# Patient Record
Sex: Male | Born: 1965 | Race: Black or African American | Hispanic: No | Marital: Single | Smoking: Current every day smoker
Health system: Southern US, Community
[De-identification: ages and names within clinical notes are randomized; demographics above are authoritative.]

## PROBLEM LIST (undated history)

## (undated) DIAGNOSIS — E119 Type 2 diabetes mellitus without complications: Secondary | ICD-10-CM

## (undated) DIAGNOSIS — I2510 Atherosclerotic heart disease of native coronary artery without angina pectoris: Secondary | ICD-10-CM

## (undated) DIAGNOSIS — I255 Ischemic cardiomyopathy: Secondary | ICD-10-CM

## (undated) DIAGNOSIS — D649 Anemia, unspecified: Secondary | ICD-10-CM

## (undated) DIAGNOSIS — I119 Hypertensive heart disease without heart failure: Secondary | ICD-10-CM

## (undated) DIAGNOSIS — G629 Polyneuropathy, unspecified: Secondary | ICD-10-CM

## (undated) DIAGNOSIS — I10 Essential (primary) hypertension: Secondary | ICD-10-CM

## (undated) DIAGNOSIS — I5042 Chronic combined systolic (congestive) and diastolic (congestive) heart failure: Secondary | ICD-10-CM

## (undated) DIAGNOSIS — R569 Unspecified convulsions: Secondary | ICD-10-CM

## (undated) DIAGNOSIS — Z227 Latent tuberculosis: Secondary | ICD-10-CM

## (undated) DIAGNOSIS — I6529 Occlusion and stenosis of unspecified carotid artery: Secondary | ICD-10-CM

## (undated) DIAGNOSIS — F32A Depression, unspecified: Secondary | ICD-10-CM

## (undated) DIAGNOSIS — K219 Gastro-esophageal reflux disease without esophagitis: Secondary | ICD-10-CM

## (undated) DIAGNOSIS — F4310 Post-traumatic stress disorder, unspecified: Secondary | ICD-10-CM

## (undated) DIAGNOSIS — Z720 Tobacco use: Secondary | ICD-10-CM

## (undated) DIAGNOSIS — A159 Respiratory tuberculosis unspecified: Secondary | ICD-10-CM

## (undated) DIAGNOSIS — J189 Pneumonia, unspecified organism: Secondary | ICD-10-CM

## (undated) DIAGNOSIS — R011 Cardiac murmur, unspecified: Secondary | ICD-10-CM

## (undated) DIAGNOSIS — N186 End stage renal disease: Secondary | ICD-10-CM

## (undated) DIAGNOSIS — I509 Heart failure, unspecified: Secondary | ICD-10-CM

## (undated) DIAGNOSIS — N189 Chronic kidney disease, unspecified: Secondary | ICD-10-CM

## (undated) HISTORY — PX: OTHER SURGICAL HISTORY: SHX169

## (undated) HISTORY — DX: Depression, unspecified: F32.A

## (undated) HISTORY — DX: Occlusion and stenosis of unspecified carotid artery: I65.29

## (undated) HISTORY — PX: TOE AMPUTATION: SHX809

---

## 2011-10-14 ENCOUNTER — Encounter (HOSPITAL_BASED_OUTPATIENT_CLINIC_OR_DEPARTMENT_OTHER): Payer: Self-pay | Attending: General Surgery

## 2011-10-14 DIAGNOSIS — Z794 Long term (current) use of insulin: Secondary | ICD-10-CM | POA: Insufficient documentation

## 2011-10-14 DIAGNOSIS — L97409 Non-pressure chronic ulcer of unspecified heel and midfoot with unspecified severity: Secondary | ICD-10-CM | POA: Insufficient documentation

## 2011-10-14 DIAGNOSIS — L97509 Non-pressure chronic ulcer of other part of unspecified foot with unspecified severity: Secondary | ICD-10-CM | POA: Insufficient documentation

## 2011-10-14 DIAGNOSIS — E1169 Type 2 diabetes mellitus with other specified complication: Secondary | ICD-10-CM | POA: Insufficient documentation

## 2011-10-14 DIAGNOSIS — L84 Corns and callosities: Secondary | ICD-10-CM | POA: Insufficient documentation

## 2011-10-14 LAB — GLUCOSE, CAPILLARY: Glucose-Capillary: 143 mg/dL — ABNORMAL HIGH (ref 70–99)

## 2011-10-14 NOTE — Progress Notes (Signed)
[facility]  NAME:  Cody Webster, CRISANTO NO.:  [id number]  MEDICAL RECORD NO.:  [id number]      DATE OF BIRTH:  July 24, 1965  PHYSICIAN:  Judene Companion, M.D.           VISIT DATE:                                  OFFICE VISIT   A 46 year old, African American male, who has been a diabetic for 5 or 6 years.  He is insulin-dependent and poorly controlled.  He has had for the last few months bilateral diabetic foot ulcers.  They are almost mirror images and they are on his heels, the lateral aspect of both of his heels and the lateral aspect in the area of the 5th MP joints of both feet.  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  He is not obese, he is well muscled.  He works hard, and of course, having these diabetic foot ulcers makes it difficult.  We talked about total contact cast while he was here and we are going to start out on the left foot putting a total contact cast, and do multiple debridements.  I debrided all the callus on all 4 of his wounds.  All of them are about a cm or so in diameter or little larger and they look reasonably clean, and at least at this point, I do not think he has any evidence of osteomyelitis.  He will come back in a week and I also thought that he would be a candidate for Dermagraft, so we will keep evaluating him for Dermagraft, and at this time, we are treating him with collagen, and he will come back in a week.     Judene Companion, M.D.     PP/MEDQ  D:  10/14/2011  T:  10/14/2011  Job:  [id number]

## 2011-10-17 ENCOUNTER — Encounter (HOSPITAL_BASED_OUTPATIENT_CLINIC_OR_DEPARTMENT_OTHER): Payer: Self-pay | Attending: General Surgery

## 2011-10-17 DIAGNOSIS — I10 Essential (primary) hypertension: Secondary | ICD-10-CM | POA: Insufficient documentation

## 2011-10-17 DIAGNOSIS — E1169 Type 2 diabetes mellitus with other specified complication: Secondary | ICD-10-CM | POA: Insufficient documentation

## 2011-10-17 DIAGNOSIS — L97509 Non-pressure chronic ulcer of other part of unspecified foot with unspecified severity: Secondary | ICD-10-CM | POA: Insufficient documentation

## 2011-10-17 DIAGNOSIS — L97409 Non-pressure chronic ulcer of unspecified heel and midfoot with unspecified severity: Secondary | ICD-10-CM | POA: Insufficient documentation

## 2011-10-17 DIAGNOSIS — Z79899 Other long term (current) drug therapy: Secondary | ICD-10-CM | POA: Insufficient documentation

## 2011-10-21 LAB — GLUCOSE, CAPILLARY: Glucose-Capillary: 167 mg/dL — ABNORMAL HIGH (ref 70–99)

## 2011-11-04 ENCOUNTER — Ambulatory Visit (HOSPITAL_COMMUNITY)
Admission: RE | Admit: 2011-11-04 | Discharge: 2011-11-04 | Disposition: A | Discharge status: Home / Self Care | Payer: Self-pay | Source: Ambulatory Visit | Attending: General Surgery | Admitting: General Surgery

## 2011-11-04 ENCOUNTER — Other Ambulatory Visit (HOSPITAL_BASED_OUTPATIENT_CLINIC_OR_DEPARTMENT_OTHER): Payer: Self-pay | Admitting: General Surgery

## 2011-11-04 DIAGNOSIS — E119 Type 2 diabetes mellitus without complications: Secondary | ICD-10-CM | POA: Insufficient documentation

## 2011-11-04 DIAGNOSIS — R52 Pain, unspecified: Secondary | ICD-10-CM

## 2011-11-04 LAB — GLUCOSE, CAPILLARY: Glucose-Capillary: 129 mg/dL — ABNORMAL HIGH (ref 70–99)

## 2011-11-11 ENCOUNTER — Encounter (HOSPITAL_BASED_OUTPATIENT_CLINIC_OR_DEPARTMENT_OTHER): Payer: Self-pay

## 2011-11-15 ENCOUNTER — Encounter (HOSPITAL_BASED_OUTPATIENT_CLINIC_OR_DEPARTMENT_OTHER): Payer: Self-pay | Attending: General Surgery

## 2011-11-15 DIAGNOSIS — L97409 Non-pressure chronic ulcer of unspecified heel and midfoot with unspecified severity: Secondary | ICD-10-CM | POA: Insufficient documentation

## 2011-11-15 DIAGNOSIS — L97509 Non-pressure chronic ulcer of other part of unspecified foot with unspecified severity: Secondary | ICD-10-CM | POA: Insufficient documentation

## 2011-11-15 DIAGNOSIS — E1169 Type 2 diabetes mellitus with other specified complication: Secondary | ICD-10-CM | POA: Insufficient documentation

## 2011-11-18 LAB — GLUCOSE, CAPILLARY: Glucose-Capillary: 158 mg/dL — ABNORMAL HIGH (ref 70–99)

## 2011-12-15 ENCOUNTER — Encounter (HOSPITAL_BASED_OUTPATIENT_CLINIC_OR_DEPARTMENT_OTHER): Payer: Self-pay | Attending: General Surgery

## 2011-12-15 DIAGNOSIS — L97509 Non-pressure chronic ulcer of other part of unspecified foot with unspecified severity: Secondary | ICD-10-CM | POA: Insufficient documentation

## 2011-12-15 DIAGNOSIS — E1169 Type 2 diabetes mellitus with other specified complication: Secondary | ICD-10-CM | POA: Insufficient documentation

## 2011-12-15 DIAGNOSIS — L97409 Non-pressure chronic ulcer of unspecified heel and midfoot with unspecified severity: Secondary | ICD-10-CM | POA: Insufficient documentation

## 2011-12-20 ENCOUNTER — Encounter (HOSPITAL_BASED_OUTPATIENT_CLINIC_OR_DEPARTMENT_OTHER): Payer: Self-pay

## 2012-10-20 DIAGNOSIS — E785 Hyperlipidemia, unspecified: Secondary | ICD-10-CM | POA: Insufficient documentation

## 2013-05-16 DIAGNOSIS — I130 Hypertensive heart and chronic kidney disease with heart failure and stage 1 through stage 4 chronic kidney disease, or unspecified chronic kidney disease: Secondary | ICD-10-CM

## 2013-05-16 DIAGNOSIS — N181 Chronic kidney disease, stage 1: Secondary | ICD-10-CM

## 2013-05-16 HISTORY — DX: Chronic kidney disease, stage 1: N18.1

## 2013-05-16 HISTORY — DX: Hypertensive heart and chronic kidney disease with heart failure and stage 1 through stage 4 chronic kidney disease, or unspecified chronic kidney disease: I13.0

## 2013-08-30 DIAGNOSIS — M86171 Other acute osteomyelitis, right ankle and foot: Secondary | ICD-10-CM | POA: Insufficient documentation

## 2013-09-03 DIAGNOSIS — R0602 Shortness of breath: Secondary | ICD-10-CM | POA: Insufficient documentation

## 2013-10-08 DIAGNOSIS — K0889 Other specified disorders of teeth and supporting structures: Secondary | ICD-10-CM | POA: Insufficient documentation

## 2013-12-10 ENCOUNTER — Inpatient Hospital Stay (HOSPITAL_COMMUNITY)
Admission: EM | Admit: 2013-12-10 | Discharge: 2013-12-13 | DRG: 639 | Disposition: A | Discharge status: Home / Self Care | Payer: Medicaid Other | Attending: Internal Medicine | Admitting: Internal Medicine

## 2013-12-10 ENCOUNTER — Emergency Department (HOSPITAL_COMMUNITY): Payer: Medicaid Other

## 2013-12-10 ENCOUNTER — Encounter (HOSPITAL_COMMUNITY): Payer: Self-pay | Admitting: Emergency Medicine

## 2013-12-10 DIAGNOSIS — I10 Essential (primary) hypertension: Secondary | ICD-10-CM | POA: Diagnosis present

## 2013-12-10 DIAGNOSIS — F17200 Nicotine dependence, unspecified, uncomplicated: Secondary | ICD-10-CM | POA: Diagnosis present

## 2013-12-10 DIAGNOSIS — E1169 Type 2 diabetes mellitus with other specified complication: Secondary | ICD-10-CM | POA: Diagnosis not present

## 2013-12-10 DIAGNOSIS — L97509 Non-pressure chronic ulcer of other part of unspecified foot with unspecified severity: Secondary | ICD-10-CM | POA: Diagnosis present

## 2013-12-10 DIAGNOSIS — Z833 Family history of diabetes mellitus: Secondary | ICD-10-CM

## 2013-12-10 DIAGNOSIS — N189 Chronic kidney disease, unspecified: Secondary | ICD-10-CM | POA: Diagnosis present

## 2013-12-10 DIAGNOSIS — M79609 Pain in unspecified limb: Secondary | ICD-10-CM | POA: Diagnosis present

## 2013-12-10 DIAGNOSIS — E1149 Type 2 diabetes mellitus with other diabetic neurological complication: Secondary | ICD-10-CM | POA: Diagnosis present

## 2013-12-10 DIAGNOSIS — I509 Heart failure, unspecified: Secondary | ICD-10-CM | POA: Diagnosis present

## 2013-12-10 DIAGNOSIS — S91302A Unspecified open wound, left foot, initial encounter: Secondary | ICD-10-CM

## 2013-12-10 DIAGNOSIS — E11621 Type 2 diabetes mellitus with foot ulcer: Secondary | ICD-10-CM | POA: Diagnosis present

## 2013-12-10 DIAGNOSIS — S91309A Unspecified open wound, unspecified foot, initial encounter: Secondary | ICD-10-CM

## 2013-12-10 DIAGNOSIS — I129 Hypertensive chronic kidney disease with stage 1 through stage 4 chronic kidney disease, or unspecified chronic kidney disease: Secondary | ICD-10-CM | POA: Diagnosis present

## 2013-12-10 DIAGNOSIS — Z66 Do not resuscitate: Secondary | ICD-10-CM | POA: Diagnosis present

## 2013-12-10 DIAGNOSIS — L97529 Non-pressure chronic ulcer of other part of left foot with unspecified severity: Secondary | ICD-10-CM

## 2013-12-10 DIAGNOSIS — E1142 Type 2 diabetes mellitus with diabetic polyneuropathy: Secondary | ICD-10-CM | POA: Diagnosis present

## 2013-12-10 DIAGNOSIS — G629 Polyneuropathy, unspecified: Secondary | ICD-10-CM | POA: Diagnosis present

## 2013-12-10 HISTORY — DX: Essential (primary) hypertension: I10

## 2013-12-10 HISTORY — DX: Heart failure, unspecified: I50.9

## 2013-12-10 HISTORY — DX: Type 2 diabetes mellitus without complications: E11.9

## 2013-12-10 LAB — CBC WITH DIFFERENTIAL/PLATELET
BASOS ABS: 0 10*3/uL (ref 0.0–0.1)
BASOS PCT: 0 % (ref 0–1)
Eosinophils Absolute: 0.1 10*3/uL (ref 0.0–0.7)
Eosinophils Relative: 1 % (ref 0–5)
HEMATOCRIT: 38.2 % — AB (ref 39.0–52.0)
Hemoglobin: 13 g/dL (ref 13.0–17.0)
LYMPHS PCT: 31 % (ref 12–46)
Lymphs Abs: 1.9 10*3/uL (ref 0.7–4.0)
MCH: 31.5 pg (ref 26.0–34.0)
MCHC: 34 g/dL (ref 30.0–36.0)
MCV: 92.5 fL (ref 78.0–100.0)
MONO ABS: 0.5 10*3/uL (ref 0.1–1.0)
Monocytes Relative: 9 % (ref 3–12)
NEUTROS ABS: 3.6 10*3/uL (ref 1.7–7.7)
NEUTROS PCT: 59 % (ref 43–77)
PLATELETS: 187 10*3/uL (ref 150–400)
RBC: 4.13 MIL/uL — ABNORMAL LOW (ref 4.22–5.81)
RDW: 14.4 % (ref 11.5–15.5)
WBC: 6.1 10*3/uL (ref 4.0–10.5)

## 2013-12-10 LAB — BASIC METABOLIC PANEL
ANION GAP: 11 (ref 5–15)
BUN: 28 mg/dL — ABNORMAL HIGH (ref 6–23)
CHLORIDE: 103 meq/L (ref 96–112)
CO2: 23 meq/L (ref 19–32)
Calcium: 8.6 mg/dL (ref 8.4–10.5)
Creatinine, Ser: 2.33 mg/dL — ABNORMAL HIGH (ref 0.50–1.35)
GFR calc Af Amer: 36 mL/min — ABNORMAL LOW (ref >90)
GFR calc non Af Amer: 31 mL/min — ABNORMAL LOW (ref >90)
Glucose, Bld: 123 mg/dL — ABNORMAL HIGH (ref 70–99)
Potassium: 4.2 mEq/L (ref 3.7–5.3)
SODIUM: 137 meq/L (ref 137–147)

## 2013-12-10 LAB — SEDIMENTATION RATE: SED RATE: 25 mm/h — AB (ref 0–16)

## 2013-12-10 LAB — GLUCOSE, CAPILLARY: GLUCOSE-CAPILLARY: 226 mg/dL — AB (ref 70–99)

## 2013-12-10 LAB — C-REACTIVE PROTEIN: CRP: <0.5 mg/dL — ABNORMAL LOW (ref <0.60)

## 2013-12-10 MED ORDER — INSULIN ASPART 100 UNIT/ML ~~LOC~~ SOLN
0.0000 [IU] | Freq: Three times a day (TID) | SUBCUTANEOUS | Status: DC
  Administered 2013-12-11 – 2013-12-12 (×3): 3 [IU] via SUBCUTANEOUS
  Administered 2013-12-13: 2 [IU] via SUBCUTANEOUS

## 2013-12-10 MED ORDER — PIPERACILLIN-TAZOBACTAM 3.375 G IVPB
3.3750 g | Freq: Three times a day (TID) | INTRAVENOUS | Status: DC
  Administered 2013-12-11 – 2013-12-13 (×7): 3.375 g via INTRAVENOUS
  Filled 2013-12-10 (×8): qty 50

## 2013-12-10 MED ORDER — PIPERACILLIN-TAZOBACTAM 3.375 G IVPB
3.3750 g | Freq: Once | INTRAVENOUS | Status: AC
  Administered 2013-12-10: 3.375 g via INTRAVENOUS
  Filled 2013-12-10: qty 50

## 2013-12-10 MED ORDER — CARVEDILOL 12.5 MG PO TABS
12.5000 mg | ORAL_TABLET | Freq: Two times a day (BID) | ORAL | Status: DC
  Administered 2013-12-11 (×3): 12.5 mg via ORAL
  Filled 2013-12-10 (×4): qty 1

## 2013-12-10 MED ORDER — ACETAMINOPHEN 650 MG RE SUPP: 650.0000 mg | Freq: Four times a day (QID) | RECTAL | Status: DC | PRN

## 2013-12-10 MED ORDER — ALUM & MAG HYDROXIDE-SIMETH 200-200-20 MG/5ML PO SUSP
30.0000 mL | Freq: Four times a day (QID) | ORAL | Status: DC | PRN
  Administered 2013-12-11 – 2013-12-13 (×2): 30 mL via ORAL
  Filled 2013-12-10 (×2): qty 30

## 2013-12-10 MED ORDER — HYDROCODONE-ACETAMINOPHEN 5-325 MG PO TABS
1.0000 | ORAL_TABLET | ORAL | Status: DC | PRN
  Administered 2013-12-10 – 2013-12-12 (×6): 2 via ORAL
  Filled 2013-12-10 (×8): qty 2

## 2013-12-10 MED ORDER — INSULIN ASPART 100 UNIT/ML ~~LOC~~ SOLN
0.0000 [IU] | Freq: Every day | SUBCUTANEOUS | Status: DC
  Administered 2013-12-10: 2 [IU] via SUBCUTANEOUS

## 2013-12-10 MED ORDER — HYDROMORPHONE HCL PF 1 MG/ML IJ SOLN
1.0000 mg | Freq: Once | INTRAMUSCULAR | Status: AC
  Administered 2013-12-10: 1 mg via INTRAVENOUS
  Filled 2013-12-10: qty 1

## 2013-12-10 MED ORDER — HEPARIN SODIUM (PORCINE) 5000 UNIT/ML IJ SOLN
5000.0000 [IU] | Freq: Three times a day (TID) | INTRAMUSCULAR | Status: DC
  Administered 2013-12-10 – 2013-12-13 (×8): 5000 [IU] via SUBCUTANEOUS
  Filled 2013-12-10 (×11): qty 1

## 2013-12-10 MED ORDER — DOCUSATE SODIUM 100 MG PO CAPS
100.0000 mg | ORAL_CAPSULE | Freq: Two times a day (BID) | ORAL | Status: DC
  Administered 2013-12-10 – 2013-12-13 (×6): 100 mg via ORAL

## 2013-12-10 MED ORDER — GABAPENTIN 100 MG PO CAPS
100.0000 mg | ORAL_CAPSULE | Freq: Three times a day (TID) | ORAL | Status: DC
  Administered 2013-12-10 – 2013-12-13 (×8): 100 mg via ORAL
  Filled 2013-12-10 (×11): qty 1

## 2013-12-10 MED ORDER — VANCOMYCIN HCL 10 G IV SOLR
1500.0000 mg | INTRAVENOUS | Status: DC
  Administered 2013-12-11 – 2013-12-12 (×2): 1500 mg via INTRAVENOUS
  Filled 2013-12-10 (×2): qty 1500

## 2013-12-10 MED ORDER — FENTANYL CITRATE 0.05 MG/ML IJ SOLN
50.0000 ug | Freq: Once | INTRAMUSCULAR | Status: AC
  Administered 2013-12-10: 50 ug via INTRAVENOUS
  Filled 2013-12-10: qty 2

## 2013-12-10 MED ORDER — HYDRALAZINE HCL 20 MG/ML IJ SOLN
5.0000 mg | Freq: Four times a day (QID) | INTRAMUSCULAR | Status: DC | PRN
  Administered 2013-12-11 – 2013-12-12 (×2): 5 mg via INTRAVENOUS
  Filled 2013-12-10 (×3): qty 1

## 2013-12-10 MED ORDER — ACETAMINOPHEN 325 MG PO TABS: 650.0000 mg | ORAL_TABLET | Freq: Four times a day (QID) | ORAL | Status: DC | PRN

## 2013-12-10 MED ORDER — IPRATROPIUM-ALBUTEROL 0.5-2.5 (3) MG/3ML IN SOLN: 3.0000 mL | RESPIRATORY_TRACT | Status: DC | PRN

## 2013-12-10 MED ORDER — PROMETHAZINE HCL 25 MG PO TABS
12.5000 mg | ORAL_TABLET | Freq: Four times a day (QID) | ORAL | Status: DC | PRN
  Administered 2013-12-11: 12.5 mg via ORAL
  Filled 2013-12-10: qty 1

## 2013-12-10 MED ORDER — FENTANYL CITRATE 0.05 MG/ML IJ SOLN
100.0000 ug | Freq: Once | INTRAMUSCULAR | Status: DC
  Filled 2013-12-10: qty 2

## 2013-12-10 MED ORDER — HYDROMORPHONE HCL PF 1 MG/ML IJ SOLN
1.0000 mg | INTRAMUSCULAR | Status: DC | PRN
  Administered 2013-12-11 – 2013-12-13 (×4): 1 mg via INTRAVENOUS
  Filled 2013-12-10 (×4): qty 1

## 2013-12-10 MED ORDER — DIPHENHYDRAMINE HCL 25 MG PO CAPS
25.0000 mg | ORAL_CAPSULE | Freq: Four times a day (QID) | ORAL | Status: DC | PRN
Start: 2013-12-10 — End: 2013-12-13
  Administered 2013-12-11 – 2013-12-12 (×4): 25 mg via ORAL
  Filled 2013-12-10 (×4): qty 1

## 2013-12-10 MED ORDER — SODIUM CHLORIDE 0.9 % IV SOLN
1500.0000 mg | Freq: Once | INTRAVENOUS | Status: AC
  Administered 2013-12-10: 1500 mg via INTRAVENOUS
  Filled 2013-12-10: qty 1500

## 2013-12-10 MED ORDER — NICOTINE 14 MG/24HR TD PT24
14.0000 mg | MEDICATED_PATCH | Freq: Every day | TRANSDERMAL | Status: DC
  Administered 2013-12-10 – 2013-12-13 (×4): 14 mg via TRANSDERMAL
  Filled 2013-12-10 (×4): qty 1

## 2013-12-10 MED ORDER — HYDRALAZINE HCL 20 MG/ML IJ SOLN
5.0000 mg | Freq: Once | INTRAMUSCULAR | Status: AC
  Administered 2013-12-10: 5 mg via INTRAVENOUS
  Filled 2013-12-10: qty 1

## 2013-12-10 NOTE — ED Notes (Signed)
Admitting dr at bedside, I will draw 2nd set of blood cultures once dr is done talking to pt.

## 2013-12-10 NOTE — ED Provider Notes (Signed)
CSN: [id number]     Arrival date & time 12/10/13  1343 History   First MD Initiated Contact with Patient 12/10/13 1459     Chief Complaint  Patient presents with  . Foot Ulcer     (Consider location/radiation/quality/duration/timing/severity/associated sxs/prior Treatment) HPI 48 year old male presents with left foot ulcer that has been draining for past 2 days. No fevers. Has had increased pain at foot. Has had ulcer for past 3-4 months. Several months ago had similar symptoms in right foot and had pinky toe amputated. States this is exactly how that one started. Has been on oral doxycycline since finishing IV abx through PICC. Orthopedic surgeon is in [place]. Rates pain as 9/10. Feels like both legs are swelling as well.   Past Medical History  Diagnosis Date  . Hypertension   . Diabetes mellitus without complication   . CHF (congestive heart failure)    Past Surgical History  Procedure Laterality Date  . Toe amputation     No family history on file. History  Substance Use Topics  . Smoking status: Current Every Day Smoker  . Smokeless tobacco: Not on file  . Alcohol Use: Yes    Review of Systems  Constitutional: Negative for fever.  Cardiovascular: Positive for leg swelling.  Gastrointestinal: Negative for vomiting.  Musculoskeletal: Positive for arthralgias.  Skin: Positive for wound.  All other systems reviewed and are negative.     Allergies  Morphine and related  Home Medications   Prior to Admission medications   Medication Sig Start Date End Date Taking? Authorizing Provider  carvedilol (COREG) 12.5 MG tablet Take 12.5 mg by mouth 2 (two) times daily with a meal.   Yes Historical Provider, MD  doxycycline (VIBRA-TABS) 100 MG tablet Take 100 mg by mouth 2 (two) times daily.   Yes Historical Provider, MD  gabapentin (NEURONTIN) 100 MG capsule Take 100 mg by mouth 3 (three) times daily.   Yes Historical Provider, MD   BP 196/99  Pulse 83  Temp(Src) 99.1  F (37.3 C) (Oral)  Resp 16  SpO2 100% Physical Exam  Nursing note and vitals reviewed. Constitutional: He is oriented to person, place, and time. He appears well-developed and well-nourished.  HENT:  Head: Normocephalic and atraumatic.  Right Ear: External ear normal.  Left Ear: External ear normal.  Nose: Nose normal.  Eyes: Right eye exhibits no discharge. Left eye exhibits no discharge.  Neck: Neck supple.  Cardiovascular: Normal rate, regular rhythm, normal heart sounds and intact distal pulses.   Pulses:      Dorsalis pedis pulses are 2+ on the right side, and 2+ on the left side.  Pulmonary/Chest: Effort normal.  Abdominal: Soft. There is no tenderness.  Musculoskeletal:  1 cm deep ulcer to left plantar foot. No drainage. Tenderness around foot. Right 5th toe surgically absent. No evidence of infection in that foot.  Neurological: He is alert and oriented to person, place, and time.  Skin: Skin is warm and dry. No erythema.    ED Course  Procedures (including critical care time) Labs Review Labs Reviewed  CBC WITH DIFFERENTIAL - Abnormal; Notable for the following:    RBC 4.13 (*)    HCT 38.2 (*)    All other components within normal limits  BASIC METABOLIC PANEL - Abnormal; Notable for the following:    Glucose, Bld 123 (*)    BUN 28 (*)    Creatinine, Ser 2.33 (*)    GFR calc non Af Amer 31 (*)  GFR calc Af Amer 36 (*)    All other components within normal limits  SEDIMENTATION RATE - Abnormal; Notable for the following:    Sed Rate 25 (*)    All other components within normal limits  C-REACTIVE PROTEIN - Abnormal; Notable for the following:    CRP <0.5 (*)    All other components within normal limits  GLUCOSE, CAPILLARY - Abnormal; Notable for the following:    Glucose-Capillary 226 (*)    All other components within normal limits  GLUCOSE, CAPILLARY - Abnormal; Notable for the following:    Glucose-Capillary 129 (*)    All other components within normal  limits  CULTURE, BLOOD (ROUTINE X 2)  CULTURE, BLOOD (ROUTINE X 2)  BASIC METABOLIC PANEL  MAGNESIUM  PHOSPHORUS  CBC  HEMOGLOBIN A1C    Imaging Review Dg Foot Complete Left  12/10/2013   CLINICAL DATA:  Nonhealing ulcer, diabetes  EXAM: LEFT FOOT - COMPLETE 3+ VIEW  COMPARISON:  11/04/2011  FINDINGS: Normal alignment without acute fracture. Lucency within the soft tissues of the left foot along the fifth toe at the MTP joint and proximal phalanx on the oblique view, suspect ulceration. No underlying bone loss or periostitis. Relatively preserved joint spaces. No significant arthropathy. No radiopaque foreign body.  IMPRESSION: No definite acute osseous finding by plain radiography.   Electronically Signed   By: Daryll Brod M.D.   On: 12/10/2013 16:05     EKG Interpretation None      MDM   Final diagnoses:  Open wound of plantar aspect of foot, left, initial encounter    Given patient's diabetes and hx of osteo with recurrent symptoms but on different foot, I feel he should be admitted for osteo workup. Discussed with hospitalist, they request blood cultures and broad abx, and admit.    Ephraim Hamburger, MD 12/11/13 [phone]

## 2013-12-10 NOTE — H&P (Signed)
PCP: None   Chief Complaint:  Pain left foot  HPI: This is a 48y/o gentle man with a h/o diabetes and diabetic foot ulcer. He recently had an amputation of right small toe approximately 70months ago. He has a diabetetic foot ulcer on the sole of the left foot approximately 69months now. He was being seen at wound care in [place] but moved to [place] approximately 2 months ago. He has not been to see a doctor wound care since. He's not on any diabetic medications he states this was discontinued when he was in [place] due to an elevated creatinine and was not restarted. He was on metformin. He states mostly ranges in width in the low 100s. He states approximately 2 days ago he started developing drainage from his foot ulcer. He has increased pain and warmth. He denies any fever, chills, nausea, vomiting. He came to the ER. He states his she does previously to evaluate circulation and these have been normal.   Review of Systems:  The patient denies anorexia, fever, weight loss,, vision loss, decreased hearing, hoarseness, chest pain, syncope, dyspnea on exertion, peripheral edema, balance deficits, hemoptysis, abdominal pain, melena, hematochezia, severe indigestion/heartburn, hematuria, incontinence, genital sores, muscle weakness, suspicious skin lesions, transient blindness, difficulty walking, depression, unusual weight change, abnormal bleeding, enlarged lymph nodes, angioedema, and breast masses.  Past Medical History: Past Medical History  Diagnosis Date  . Hypertension   . Diabetes mellitus without complication   . CHF (congestive heart failure)    Past Surgical History  Procedure Laterality Date  . Toe amputation    . Right foot surgery      Medications: Prior to Admission medications   Medication Sig Start Date End Date Taking? Authorizing Provider  carvedilol (COREG) 12.5 MG tablet Take 12.5 mg by mouth 2 (two) times daily with a meal.   Yes Historical Provider, MD  doxycycline  (VIBRA-TABS) 100 MG tablet Take 100 mg by mouth 2 (two) times daily.   Yes Historical Provider, MD  gabapentin (NEURONTIN) 100 MG capsule Take 100 mg by mouth 3 (three) times daily.   Yes Historical Provider, MD    Allergies:   Allergies  Allergen Reactions  . Influenza Vaccines     Chest congestion, fever  . Pneumococcal Vaccines     Nausea, vomiting, fever  . Morphine And Related Other (See Comments)    Social History:  reports that he has been smoking Cigarettes and Cigars.  He has a 15 pack-year smoking history. He quit smokeless tobacco use about 30 years ago. His smokeless tobacco use included Snuff and Chew. He reports that he drinks alcohol. He reports that he does not use illicit drugs.  Family History: History reviewed. HTN, Diabetes  Physical Exam: Filed Vitals:   12/10/13 1412 12/10/13 1601 12/10/13 1856 12/10/13 1906  BP: 196/99 209/93  202/103  Pulse: 83 84  82  Temp: 99.1 F (37.3 C) 98.3 F (36.8 C)    TempSrc: Oral Rectal    Resp: 16 16  16   Height:   6' (1.829 m)   Weight:   103.42 kg (228 lb)   SpO2: 100% 98%  97%    General:  Alert and oriented times three, well developed and nourished, no acute distress Eyes: PERRLA, pink conjunctiva, no scleral icterus ENT: Moist oral mucosa, neck supple, no thyromegaly Lungs: clear to ascultation, no wheeze, no crackles, no use of accessory muscles Cardiovascular: regular rate and rhythm, no regurgitation, no gallops, no murmurs. No carotid bruits, no  JVD Abdomen: soft, positive BS, non-tender, non-distended, no organomegaly, not an acute abdomen GU: not examined Neuro: CN II - XII grossly intact, sensation intact Musculoskeletal: strength 5/5 all extremities, no clubbing, cyanosis or edema. Right small toe with healed surgical scar. For diabetic ulcer on plantar surface approximately dime sized. Positive surrounding tenderness to palpation.  no drainage appreciated currently.  Skin: no rash, no subcutaneous  crepitation, no decubitus Psych: appropriate patient   Labs on Admission:   Recent Labs  12/10/13 1544  NA 137  K 4.2  CL 103  CO2 23  GLUCOSE 123*  BUN 28*  CREATININE 2.33*  CALCIUM 8.6   No results found for this basename: AST, ALT, ALKPHOS, BILITOT, PROT, ALBUMIN,  in the last 72 hours No results found for this basename: LIPASE, AMYLASE,  in the last 72 hours  Recent Labs  12/10/13 1544  WBC 6.1  NEUTROABS 3.6  HGB 13.0  HCT 38.2*  MCV 92.5  PLT 187   No results found for this basename: CKTOTAL, CKMB, CKMBINDEX, TROPONINI,  in the last 72 hours No components found with this basename: POCBNP,  No results found for this basename: DDIMER,  in the last 72 hours No results found for this basename: HGBA1C,  in the last 72 hours No results found for this basename: CHOL, HDL, LDLCALC, TRIG, CHOLHDL, LDLDIRECT,  in the last 72 hours No results found for this basename: TSH, T4TOTAL, FREET3, T3FREE, THYROIDAB,  in the last 72 hours No results found for this basename: VITAMINB12, FOLATE, FERRITIN, TIBC, IRON, RETICCTPCT,  in the last 72 hours  Micro Results: No results found for this or any previous visit (from the past 240 hour(s)).   Radiological Exams on Admission: Dg Foot Complete Left  12/10/2013   CLINICAL DATA:  Nonhealing ulcer, diabetes  EXAM: LEFT FOOT - COMPLETE 3+ VIEW  COMPARISON:  11/04/2011  FINDINGS: Normal alignment without acute fracture. Lucency within the soft tissues of the left foot along the fifth toe at the MTP joint and proximal phalanx on the oblique view, suspect ulceration. No underlying bone loss or periostitis. Relatively preserved joint spaces. No significant arthropathy. No radiopaque foreign body.  IMPRESSION: No definite acute osseous finding by plain radiography.   Electronically Signed   By: Daryll Brod M.D.   On: 12/10/2013 16:05    Assessment/Plan Present on Admission:  . Diabetic foot ulcer -Admit to MedSurg, patient had risks due to  the PCP and to appropriate followup -MRI foot to evaluate for osteomyelitis -Antibiotics Vanco and Zosyn pharmacy to dose -Pain medications as needed -Blood cultures ordered  -Attempts be made to have patient followup on discharge at [facility]  Chronic kidney disease  -Monitor Hypertension uncontrolled  -Blood pressure medications ordered  -Resume home medications   tobacco abuse  -Nicotine patch anaphylaxis as needed   diabetes mellitus -ADA diet, sliding scale insulin and hemoglobin A1c ordered -No diabetic medication initiated  with evaluate trend and hemoglobin A1c before beginning medications. Patient's fingerstick blood sugar currently is [place], Alazne Quant 12/10/2013, 8:03 PM

## 2013-12-10 NOTE — ED Notes (Signed)
Per pt, states DM foot ulcer on left foot-increased pain-is currently on anitbiotics

## 2013-12-10 NOTE — Progress Notes (Signed)
ANTIBIOTIC CONSULT NOTE - INITIAL  Pharmacy Consult for Vancomycin & Zosyn Indication: Wound Infection  Allergies  Allergen Reactions  . Influenza Vaccines     Chest congestion, fever  . Pneumococcal Vaccines     Nausea, vomiting, fever  . Morphine And Related Other (See Comments)    Patient Measurements: Height: 6' (182.9 cm) Weight: 228 lb (103.42 kg) (as reported by ER RN) IBW/kg (Calculated) : 77.6  Vital Signs: Temp: 98.9 F (37.2 C) (07/28 2059) Temp src: Oral (07/28 2059) BP: 187/93 mmHg (07/28 2059) Pulse Rate: 80 (07/28 2059) Intake/Output from previous day:   Intake/Output from this shift:    Labs:  Recent Labs  12/10/13 1544  WBC 6.1  HGB 13.0  PLT 187  CREATININE 2.33*   Estimated Creatinine Clearance: 47.2 ml/min (by C-G formula based on Cr of 2.33). No results found for this basename: VANCOTROUGH, VANCOPEAK, VANCORANDOM, GENTTROUGH, GENTPEAK, GENTRANDOM, TOBRATROUGH, TOBRAPEAK, TOBRARND, AMIKACINPEAK, AMIKACINTROU, AMIKACIN,  in the last 72 hours   Microbiology: No results found for this or any previous visit (from the past 720 hour(s)).  Medical History: Past Medical History  Diagnosis Date  . Hypertension   . Diabetes mellitus without complication   . CHF (congestive heart failure)     Medications:  Scheduled:  . [START ON 12/11/2013] carvedilol  12.5 mg Oral BID WC  . docusate sodium  100 mg Oral BID  . fentaNYL  100 mcg Intravenous Once  . gabapentin  100 mg Oral TID  . heparin  5,000 Units Subcutaneous 3 times per day  . [START ON 12/11/2013] insulin aspart  0-15 Units Subcutaneous TID WC  . insulin aspart  0-5 Units Subcutaneous QHS  . nicotine  14 mg Transdermal Daily  . vancomycin  1,500 mg Intravenous Once   Infusions:   Assessment:  48 yr male with complaint of pain in left foot (pain, warmth and drainage noted)  H/O diabetes and diabetic foot ulcer.  Recent amputation of right toe  Zosyn 3.375gm given in ED @ 20:01 and  Vancomycin 1500mg  given in ED @ 20:30  Plan for MRI to ro/o osteomyelitis  Blood cultures x 2 ordered  Pharmacy asked to dose Vancomycin and Zosyn for diabetic foot ulcer wound infection  CrCl (n) ~ 38 ml/min  Goal of Therapy:  Vancomycin trough level 15-20 mcg/ml  Plan:   Vancomycin 1500mg  IV q24h  Zosyn 3.375gm IV q8h (each dose infused over 4 hrs)  Follow cultures and sensitivities  Monitor renal function  Bawi Lakins, Toribio Harbour, PharmD 12/10/2013,9:05 PM

## 2013-12-10 NOTE — ED Notes (Signed)
Patient transported to X-ray 

## 2013-12-11 ENCOUNTER — Inpatient Hospital Stay (HOSPITAL_COMMUNITY): Payer: Medicaid Other

## 2013-12-11 DIAGNOSIS — I10 Essential (primary) hypertension: Secondary | ICD-10-CM

## 2013-12-11 DIAGNOSIS — L97509 Non-pressure chronic ulcer of other part of unspecified foot with unspecified severity: Secondary | ICD-10-CM

## 2013-12-11 DIAGNOSIS — E1169 Type 2 diabetes mellitus with other specified complication: Principal | ICD-10-CM

## 2013-12-11 DIAGNOSIS — G609 Hereditary and idiopathic neuropathy, unspecified: Secondary | ICD-10-CM

## 2013-12-11 DIAGNOSIS — Z862 Personal history of diseases of the blood and blood-forming organs and certain disorders involving the immune mechanism: Secondary | ICD-10-CM | POA: Diagnosis present

## 2013-12-11 DIAGNOSIS — N189 Chronic kidney disease, unspecified: Secondary | ICD-10-CM | POA: Diagnosis present

## 2013-12-11 LAB — URINALYSIS, ROUTINE W REFLEX MICROSCOPIC
Bilirubin Urine: NEGATIVE
Glucose, UA: 100 mg/dL — AB
Ketones, ur: NEGATIVE mg/dL
Leukocytes, UA: NEGATIVE
NITRITE: NEGATIVE
PH: 6 (ref 5.0–8.0)
Protein, ur: >300 mg/dL — AB
SPECIFIC GRAVITY, URINE: 1.016 (ref 1.005–1.030)
UROBILINOGEN UA: 0.2 mg/dL (ref 0.0–1.0)

## 2013-12-11 LAB — CBC
HCT: 42.4 % (ref 39.0–52.0)
HEMOGLOBIN: 14.2 g/dL (ref 13.0–17.0)
MCH: 31.4 pg (ref 26.0–34.0)
MCHC: 33.5 g/dL (ref 30.0–36.0)
MCV: 93.8 fL (ref 78.0–100.0)
Platelets: 209 10*3/uL (ref 150–400)
RBC: 4.52 MIL/uL (ref 4.22–5.81)
RDW: 14.5 % (ref 11.5–15.5)
WBC: 6.7 10*3/uL (ref 4.0–10.5)

## 2013-12-11 LAB — HEMOGLOBIN A1C
HEMOGLOBIN A1C: 6.2 % — AB (ref <5.7)
Mean Plasma Glucose: 131 mg/dL — ABNORMAL HIGH (ref <117)

## 2013-12-11 LAB — BASIC METABOLIC PANEL
Anion gap: 12 (ref 5–15)
BUN: 22 mg/dL (ref 6–23)
CALCIUM: 8.9 mg/dL (ref 8.4–10.5)
CHLORIDE: 103 meq/L (ref 96–112)
CO2: 24 mEq/L (ref 19–32)
CREATININE: 2.04 mg/dL — AB (ref 0.50–1.35)
GFR calc Af Amer: 42 mL/min — ABNORMAL LOW (ref >90)
GFR calc non Af Amer: 36 mL/min — ABNORMAL LOW (ref >90)
GLUCOSE: 152 mg/dL — AB (ref 70–99)
Potassium: 3.7 mEq/L (ref 3.7–5.3)
Sodium: 139 mEq/L (ref 137–147)

## 2013-12-11 LAB — URINE MICROSCOPIC-ADD ON

## 2013-12-11 LAB — GLUCOSE, CAPILLARY
GLUCOSE-CAPILLARY: 150 mg/dL — AB (ref 70–99)
GLUCOSE-CAPILLARY: 190 mg/dL — AB (ref 70–99)
GLUCOSE-CAPILLARY: 155 mg/dL — AB (ref 70–99)
Glucose-Capillary: 129 mg/dL — ABNORMAL HIGH (ref 70–99)
Glucose-Capillary: 134 mg/dL — ABNORMAL HIGH (ref 70–99)

## 2013-12-11 LAB — MAGNESIUM: Magnesium: 1.8 mg/dL (ref 1.5–2.5)

## 2013-12-11 LAB — PHOSPHORUS: Phosphorus: 2.9 mg/dL (ref 2.3–4.6)

## 2013-12-11 MED ORDER — CARVEDILOL 12.5 MG PO TABS: 12.5000 mg | ORAL_TABLET | Freq: Once | ORAL | Status: DC

## 2013-12-11 MED ORDER — ONDANSETRON HCL 4 MG/2ML IJ SOLN
4.0000 mg | Freq: Four times a day (QID) | INTRAMUSCULAR | Status: DC | PRN
  Administered 2013-12-11: 4 mg via INTRAVENOUS
  Filled 2013-12-11: qty 2

## 2013-12-11 MED ORDER — AMLODIPINE BESYLATE 5 MG PO TABS
5.0000 mg | ORAL_TABLET | Freq: Every day | ORAL | Status: DC
  Administered 2013-12-11: 5 mg via ORAL
  Filled 2013-12-11 (×2): qty 1

## 2013-12-11 MED ORDER — HYDRALAZINE HCL 20 MG/ML IJ SOLN
10.0000 mg | Freq: Once | INTRAMUSCULAR | Status: AC
  Administered 2013-12-11: 10 mg via INTRAVENOUS
  Filled 2013-12-11: qty 1

## 2013-12-11 MED ORDER — CARVEDILOL 25 MG PO TABS
25.0000 mg | ORAL_TABLET | Freq: Two times a day (BID) | ORAL | Status: DC
  Administered 2013-12-12: 25 mg via ORAL
  Filled 2013-12-11 (×3): qty 1

## 2013-12-11 NOTE — Consult Note (Signed)
Reason for Consult: Ulceration left foot fourth metatarsal head with possible osteomyelitis Referring Physician: Dr. Lisbeth Ply Dickenson is an 48 y.o. male.  HPI: Patient is a 48 year old gentleman with diabetic insensate neuropathy. 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 Patient was concerned that the ulcer beneath the fourth metatarsal head of the left foot may develop  into acute osteomyelitis and patient presents for evaluation to rule out the potential for deep infection.  Past Medical History  Diagnosis Date  . Hypertension   . Diabetes mellitus without complication   . CHF (congestive heart failure)     Past Surgical History  Procedure Laterality Date  . Toe amputation    . Right foot surgery      History reviewed. No pertinent family history.  Social History:  reports that he has been smoking Cigarettes and Cigars.  He has a 15 pack-year smoking history. He quit smokeless tobacco use about 30 years ago. His smokeless tobacco use included Snuff and Chew. He reports that he drinks alcohol. He reports that he does not use illicit drugs.  Allergies:  Allergies  Allergen Reactions  . Influenza Vaccines     Chest congestion, fever  . Pneumococcal Vaccines     Nausea, vomiting, fever  . Morphine And Related Other (See Comments)    Medications: I have reviewed the patient's current medications.  Results for orders placed during the hospital encounter of 12/10/13 (from the past 48 hour(s))  GLUCOSE, CAPILLARY     Status: Abnormal   Collection Time    12/10/13  2:42 PM      Result Value Ref Range   Glucose-Capillary 129 (*) 70 - 99 mg/dL  CBC WITH DIFFERENTIAL     Status: Abnormal   Collection Time    12/10/13  3:44 PM      Result Value Ref Range   WBC 6.1  4.0 - 10.5 K/uL   RBC 4.13 (*) 4.22 - 5.81 MIL/uL   Hemoglobin 13.0  13.0 - 17.0  g/dL   HCT 38.2 (*) 39.0 - 52.0 %   MCV 92.5  78.0 - 100.0 fL   MCH 31.5  26.0 - 34.0 pg   MCHC 34.0  30.0 - 36.0 g/dL   RDW 14.4  11.5 - 15.5 %   Platelets 187  150 - 400 K/uL   Neutrophils Relative % 59  43 - 77 %   Neutro Abs 3.6  1.7 - 7.7 K/uL   Lymphocytes Relative 31  12 - 46 %   Lymphs Abs 1.9  0.7 - 4.0 K/uL   Monocytes Relative 9  3 - 12 %   Monocytes Absolute 0.5  0.1 - 1.0 K/uL   Eosinophils Relative 1  0 - 5 %   Eosinophils Absolute 0.1  0.0 - 0.7 K/uL   Basophils Relative 0  0 - 1 %   Basophils Absolute 0.0  0.0 - 0.1 K/uL  BASIC METABOLIC PANEL     Status: Abnormal   Collection Time    12/10/13  3:44 PM      Result Value Ref Range   Sodium 137  137 - 147 mEq/L   Potassium 4.2  3.7 - 5.3 mEq/L   Chloride 103  96 - 112 mEq/L   CO2 23  19 - 32 mEq/L   Glucose, Bld 123 (*) 70 - 99 mg/dL  BUN 28 (*) 6 - 23 mg/dL   Creatinine, Ser 2.33 (*) 0.50 - 1.35 mg/dL   Calcium 8.6  8.4 - 10.5 mg/dL   GFR calc non Af Amer 31 (*) >90 mL/min   GFR calc Af Amer 36 (*) >90 mL/min   Comment: (NOTE)     The eGFR has been calculated using the CKD EPI equation.     This calculation has not been validated in all clinical situations.     eGFR's persistently <90 mL/min signify possible Chronic Kidney     Disease.   Anion gap 11  5 - 15  SEDIMENTATION RATE     Status: Abnormal   Collection Time    12/10/13  3:44 PM      Result Value Ref Range   Sed Rate 25 (*) 0 - 16 mm/hr  C-REACTIVE PROTEIN     Status: Abnormal   Collection Time    12/10/13  3:44 PM      Result Value Ref Range   CRP <0.5 (*) <0.60 mg/dL   Comment: Performed at [facility], BLOOD (ROUTINE X 2)     Status: None   Collection Time    12/10/13  7:33 PM      Result Value Ref Range   Specimen Description BLOOD RIGHT ANTECUBITAL     Special Requests BOTTLES DRAWN AEROBIC AND ANAEROBIC 5CC     Culture  Setup Time       Value: 12/10/2013 22:31     Performed at [facility]   Culture        Value:        BLOOD CULTURE RECEIVED NO GROWTH TO DATE CULTURE WILL BE HELD FOR 5 DAYS BEFORE ISSUING A FINAL NEGATIVE REPORT     Performed at [facility]   Report Status PENDING    CULTURE, BLOOD (ROUTINE X 2)     Status: None   Collection Time    12/10/13  7:52 PM      Result Value Ref Range   Specimen Description BLOOD LEFT ANTECUBITAL     Special Requests BOTTLES DRAWN AEROBIC AND ANAEROBIC 5 ML     Culture  Setup Time       Value: 12/10/2013 22:31     Performed at [facility]   Culture       Value:        BLOOD CULTURE RECEIVED NO GROWTH TO DATE CULTURE WILL BE HELD FOR 5 DAYS BEFORE ISSUING A FINAL NEGATIVE REPORT     Performed at [facility]   Report Status PENDING    GLUCOSE, CAPILLARY     Status: Abnormal   Collection Time    12/10/13 10:38 PM      Result Value Ref Range   Glucose-Capillary 226 (*) 70 - 99 mg/dL   Comment 1 Notify RN     Comment 2 Documented in Chart    BASIC METABOLIC PANEL     Status: Abnormal   Collection Time    12/11/13  3:58 AM      Result Value Ref Range   Sodium 139  137 - 147 mEq/L   Potassium 3.7  3.7 - 5.3 mEq/L   Chloride 103  96 - 112 mEq/L   CO2 24  19 - 32 mEq/L   Glucose, Bld 152 (*) 70 - 99 mg/dL   BUN 22  6 - 23 mg/dL   Creatinine, Ser 2.04 (*) 0.50 - 1.35 mg/dL  Calcium 8.9  8.4 - 10.5 mg/dL   GFR calc non Af Amer 36 (*) >90 mL/min   GFR calc Af Amer 42 (*) >90 mL/min   Comment: (NOTE)     The eGFR has been calculated using the CKD EPI equation.     This calculation has not been validated in all clinical situations.     eGFR's persistently <90 mL/min signify possible Chronic Kidney     Disease.   Anion gap 12  5 - 15  MAGNESIUM     Status: None   Collection Time    12/11/13  3:58 AM      Result Value Ref Range   Magnesium 1.8  1.5 - 2.5 mg/dL  PHOSPHORUS     Status: None   Collection Time    12/11/13  3:58 AM      Result Value Ref Range   Phosphorus 2.9  2.3 - 4.6 mg/dL  CBC     Status:  None   Collection Time    12/11/13  3:58 AM      Result Value Ref Range   WBC 6.7  4.0 - 10.5 K/uL   RBC 4.52  4.22 - 5.81 MIL/uL   Hemoglobin 14.2  13.0 - 17.0 g/dL   HCT 42.4  39.0 - 52.0 %   MCV 93.8  78.0 - 100.0 fL   MCH 31.4  26.0 - 34.0 pg   MCHC 33.5  30.0 - 36.0 g/dL   RDW 14.5  11.5 - 15.5 %   Platelets 209  150 - 400 K/uL  HEMOGLOBIN A1C     Status: Abnormal   Collection Time    12/11/13  3:58 AM      Result Value Ref Range   Hemoglobin A1C 6.2 (*) <5.7 %   Comment: (NOTE)                                                                               According to the ADA Clinical Practice Recommendations for 2011, when     HbA1c is used as a screening test:      >=6.5%   Diagnostic of Diabetes Mellitus               (if abnormal result is confirmed)     5.7-6.4%   Increased risk of developing Diabetes Mellitus     References:Diagnosis and Classification of Diabetes Mellitus,Diabetes     XMIW,8032,12(YQMGN 1):S62-S69 and Standards of Medical Care in             Diabetes - 2011,Diabetes Care,2011,34 (Suppl 1):S11-S61.   Mean Plasma Glucose 131 (*) <117 mg/dL   Comment: Performed at [facility], CAPILLARY     Status: Abnormal   Collection Time    12/11/13  7:14 AM      Result Value Ref Range   Glucose-Capillary 134 (*) 70 - 99 mg/dL  URINALYSIS, ROUTINE W REFLEX MICROSCOPIC     Status: Abnormal   Collection Time    12/11/13 10:44 AM      Result Value Ref Range   Color, Urine YELLOW  YELLOW   APPearance CLEAR  CLEAR   Specific Gravity, Urine 1.016  1.005 - 1.030   pH 6.0  5.0 - 8.0   Glucose, UA 100 (*) NEGATIVE mg/dL   Hgb urine dipstick SMALL (*) NEGATIVE   Bilirubin Urine NEGATIVE  NEGATIVE   Ketones, ur NEGATIVE  NEGATIVE mg/dL   Protein, ur >300 (*) NEGATIVE mg/dL   Urobilinogen, UA 0.2  0.0 - 1.0 mg/dL   Nitrite NEGATIVE  NEGATIVE   Leukocytes, UA NEGATIVE  NEGATIVE  URINE MICROSCOPIC-ADD ON     Status: Abnormal   Collection Time     12/11/13 10:44 AM      Result Value Ref Range   Squamous Epithelial / LPF RARE  RARE   WBC, UA 0-2  <3 WBC/hpf   RBC / HPF 3-6  <3 RBC/hpf   Bacteria, UA FEW (*) RARE  GLUCOSE, CAPILLARY     Status: Abnormal   Collection Time    12/11/13 11:59 AM      Result Value Ref Range   Glucose-Capillary 150 (*) 70 - 99 mg/dL  GLUCOSE, CAPILLARY     Status: Abnormal   Collection Time    12/11/13  4:32 PM      Result Value Ref Range   Glucose-Capillary 190 (*) 70 - 99 mg/dL    US Renal  12/11/2013   CLINICAL DATA:  Acute renal failure superimposed on chronic kidney disease.  EXAM: RENAL/URINARY TRACT ULTRASOUND COMPLETE  COMPARISON:  None.  FINDINGS: Right Kidney:  Length: 12.6 cm. There is increased renal cortical echogenicity without significant thinning. No hydronephrosis or focal cortical lesion identified.  Left Kidney:  Length: 13.2 cm. The renal cortical echogenicity may be mildly increased. There is a cyst in the lower interpolar region, measuring 3.5 x 2.8 x 2.7 cm. No hydronephrosis.  Bladder:  Decompressed, likely accounting for suggested wall thickening.  IMPRESSION: 1. Both kidneys demonstrate probable increased echogenicity, suggesting chronic medical renal disease. 2. No hydronephrosis. 3. Left renal cyst.   Electronically Signed   By: Camie Patience M.D.   On: 12/11/2013 16:00   Mr Foot Left Wo Contrast  12/11/2013   CLINICAL DATA:  Nonhealing ulcer.  EXAM: MRI OF THE LEFT FOREFOOT WITHOUT CONTRAST  TECHNIQUE: Multiplanar, multisequence MR imaging was performed. No intravenous contrast was administered.  COMPARISON:  Radiographs 12/10/2013.  FINDINGS: Examination is limited. The patient would not complete the examination. There is also motion artifact.  Moderate soft tissue thickening noted along the plantar and lateral aspect of the forefoot mainly around the region of the fourth and fifth metatarsal heads. I do not see a discrete fluid collection to suggest a drainable abscess.  Signal  abnormality in the plantar aspects of the fourth and fifth metatarsal head is could be a stress reaction or early osteomyelitis. Similar findings involving the proximal phalanx of the fifth toe.  Diffuse edema like signal abnormality in the forefoot musculature consistent with myositis.  IMPRESSION: 1. Limited examination. 2. Cellulitis and myositis without discrete drainable abscess. 3. Findings suspicious for osteomyelitis involving the fourth and fifth metatarsal heads and the proximal phalanx of the fifth toe.   Electronically Signed   By: Kalman Jewels M.D.   On: 12/11/2013 10:12   Dg Foot Complete Left  12/10/2013   CLINICAL DATA:  Nonhealing ulcer, diabetes  EXAM: LEFT FOOT - COMPLETE 3+ VIEW  COMPARISON:  11/04/2011  FINDINGS: Normal alignment without acute fracture. Lucency within the soft tissues of the left foot along the fifth toe at the MTP joint and proximal phalanx on the oblique view, suspect  ulceration. No underlying bone loss or periostitis. Relatively preserved joint spaces. No significant arthropathy. No radiopaque foreign body.  IMPRESSION: No definite acute osseous finding by plain radiography.   Electronically Signed   By: Daryll Brod M.D.   On: 12/10/2013 16:05    Review of Systems  All other systems reviewed and are negative.  Blood pressure 150/68, pulse 88, temperature 97.3 F (36.3 C), temperature source Oral, resp. rate 20, height 6' (1.829 m), weight 103.42 kg (228 lb), SpO2 100.00%. Physical Exam On examination patient has palpable pulses. He has a stable right foot status post fifth ray amputation with no signs of infection there is callus plantarly around the surgical incision. Surgery was performed in [place]. Examination of his left foot he has a Wagner grade 1 ulcer beneath the fourth metatarsal head there is hypertrophic callus the ulcer is 3 mm deep and does not probe to bone. There is no purulence no cellulitis no drainage no signs of any deep infection.  Radiographs shows no bony destructive changes. Review of the MRI scan shows some edema in the fourth and fifth metatarsal heads  and shows no definite osteomyelitis. No abscess.  Assessment/Plan: Assessment: Wagner grade 1 ulcer left foot fourth metatarsal head with no definite osteomyelitis no abscess no cellulitis. Diabetic insensate neuropathy.  Plan: Would continue IV antibiotics through today. Okay for discharge to home tomorrow. Recommend that he continues his oral doxycycline. Recommended that he use a antibiotic ointment daily. He will continue with his Darco shoe. I will followup in the office in 2 weeks. He will need his orthotics modified in the office.  Berl Bonfanti V 12/11/2013, 5:40 PM

## 2013-12-11 NOTE — Progress Notes (Signed)
[facility] PROGRESS NOTE  Michel Arrick [id number] DOB: [birth year] DOA: 12/10/2013 PCP: Marry Guan  Assessment/Plan: #1 diabetic foot ulcer Some clinical improvement. No tenderness to palpation. Patient is currently afebrile.white count is normal.MRI of the left foot with cellulitis and myositis without discrete drainable abscess. Findings suspicious for osteomyelitis involving the fourth and fifth metatarsal heads and the proximal phalanx of the fifth toe.CRP is less than 0.5. Sedimentation rate is 25. Continue empiric IV vancomycin IV Zosyn. Wound care consultation pending. Consult with orthopedics for further evaluation and management.   #2 chronic kidney disease Baseline unknown. Renal function trending down. Continue gentle hydration. Check a UA. Outpatient followup for further management.  #3 diabetes mellitus/peripheral neuropathy Hemoglobin A1c pending. Continue carb modified diet. Continue Neurontin. Continue sliding scale insulin.  #4 hypertension Coreg has been resumed. Monitor titrated as needed may need to add another agent.  #5 tobacco abuse Tobacco cessation.continue nicotine patch.  #6 prophylaxis Heparin for DVT prophylaxis.  Code Status: per chart DO NOT RESUSCITATE Family Communication: updated patient no family at bedside. Disposition Plan: home when medically stable.   Consultants:  Orthopedics pending  Procedures:  MRI left foot 12/11/2013  X-ray left foot 12/10/2013  Antibiotics:  IV Zosyn 12/11/2013  IV vancomycin 12/11/2013  HPI/Subjective: Patient with no complaints.  Objective: Filed Vitals:   12/11/13 0519  BP: 165/78  Pulse:   Temp:   Resp:     Intake/Output Summary (Last 24 hours) at 12/11/13 1018 Last data filed at 12/11/13 0345  Gross per 24 hour  Intake      0 ml  Output      0 ml  Net      0 ml   Filed Weights   12/10/13 1856  Weight: 103.42 kg (228 lb)    Exam:   General:  NAD  Cardiovascular:  RRR  Respiratory: CTAB  Abdomen: soft, nontender, nondistended, positive bowel sounds.  Musculoskeletal: no clubbing cyanosis or edema. Left plantar foot with dime-sized ulcer with some surrounding tenderness to palpation. No drainage noted.  Data Reviewed: Basic Metabolic Panel:  Recent Labs Lab 12/10/13 1544 12/11/13 0358  NA 137 139  K 4.2 3.7  CL 103 103  CO2 23 24  GLUCOSE 123* 152*  BUN 28* 22  CREATININE 2.33* 2.04*  CALCIUM 8.6 8.9  MG  --  1.8  PHOS  --  2.9   Liver Function Tests: No results found for this basename: AST, ALT, ALKPHOS, BILITOT, PROT, ALBUMIN,  in the last 168 hours No results found for this basename: LIPASE, AMYLASE,  in the last 168 hours No results found for this basename: AMMONIA,  in the last 168 hours CBC:  Recent Labs Lab 12/10/13 1544 12/11/13 0358  WBC 6.1 6.7  NEUTROABS 3.6  --   HGB 13.0 14.2  HCT 38.2* 42.4  MCV 92.5 93.8  PLT 187 209   Cardiac Enzymes: No results found for this basename: CKTOTAL, CKMB, CKMBINDEX, TROPONINI,  in the last 168 hours BNP (last 3 results) No results found for this basename: PROBNP,  in the last 8760 hours CBG:  Recent Labs Lab 12/10/13 1442 12/10/13 2238  GLUCAP 129* 226*    No results found for this or any previous visit (from the past 240 hour(s)).   Studies: Mr Foot Left Wo Contrast  12/11/2013   CLINICAL DATA:  Nonhealing ulcer.  EXAM: MRI OF THE LEFT FOREFOOT WITHOUT CONTRAST  TECHNIQUE: Multiplanar, multisequence MR imaging was performed. No intravenous contrast was administered.  COMPARISON:  Radiographs 12/10/2013.  FINDINGS: Examination is limited. The patient would not complete the examination. There is also motion artifact.  Moderate soft tissue thickening noted along the plantar and lateral aspect of the forefoot mainly around the region of the fourth and fifth metatarsal heads. I do not see a discrete fluid collection to suggest a drainable abscess.  Signal abnormality in the  plantar aspects of the fourth and fifth metatarsal head is could be a stress reaction or early osteomyelitis. Similar findings involving the proximal phalanx of the fifth toe.  Diffuse edema like signal abnormality in the forefoot musculature consistent with myositis.  IMPRESSION: 1. Limited examination. 2. Cellulitis and myositis without discrete drainable abscess. 3. Findings suspicious for osteomyelitis involving the fourth and fifth metatarsal heads and the proximal phalanx of the fifth toe.   Electronically Signed   By: Kalman Jewels M.D.   On: 12/11/2013 10:12   Dg Foot Complete Left  12/10/2013   CLINICAL DATA:  Nonhealing ulcer, diabetes  EXAM: LEFT FOOT - COMPLETE 3+ VIEW  COMPARISON:  11/04/2011  FINDINGS: Normal alignment without acute fracture. Lucency within the soft tissues of the left foot along the fifth toe at the MTP joint and proximal phalanx on the oblique view, suspect ulceration. No underlying bone loss or periostitis. Relatively preserved joint spaces. No significant arthropathy. No radiopaque foreign body.  IMPRESSION: No definite acute osseous finding by plain radiography.   Electronically Signed   By: Daryll Brod M.D.   On: 12/10/2013 16:05    Scheduled Meds: . carvedilol  12.5 mg Oral BID WC  . docusate sodium  100 mg Oral BID  . fentaNYL  100 mcg Intravenous Once  . gabapentin  100 mg Oral TID  . heparin  5,000 Units Subcutaneous 3 times per day  . insulin aspart  0-15 Units Subcutaneous TID WC  . insulin aspart  0-5 Units Subcutaneous QHS  . nicotine  14 mg Transdermal Daily  . piperacillin-tazobactam (ZOSYN)  IV  3.375 g Intravenous Q8H  . vancomycin  1,500 mg Intravenous Q24H   Continuous Infusions:   Principal Problem:   Diabetic foot ulcer Active Problems:   Diabetes   HTN (hypertension)   Peripheral neuropathy   CKD (chronic kidney disease)    Time spent: Anna Maria MD [facility] Pager [phone]. If 7PM-7AM, please  contact night-coverage at [email or web address], password [facility] 12/11/2013, 10:18 AM  LOS: 1 day

## 2013-12-11 NOTE — Consult Note (Signed)
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 Patient has a healed 2nd metatarsal head ulceration on the left foot.  He had been followed by the [place] [facility] previously with support from [place] for his extended antibiotic therapy via a PICC line. Patient has an off-loading shoe that was intended for this right foot and he has been trying to use for this new ulcer, but it is has been ineffective.  He has prescription orthotics/shoes but they hurt his feet and he has not been wearing them. Wound type:neutropathic Pressure Ulcer POA: No Measurement: Ulcer: 0.5cm x 1cm x 0.4cm  Callous surrounding ulceration:  3cm x 4cm Wound [id number] is red, moist with surrounding hyperkeratotic dry and firm tissue surrounding  Drainage (amount, consistency, odor) scant serosanguinous drainage on old dressing. No odor Periwound:As described above Dressing procedure/placement/frequency:I will implement a conservative POC as there is an Orthopedic consult pending.  Orders for placement of saline dressings twice daily to maintain a moist wound bed and to soften the callous are provided for Nursing . [place] nursing team will not follow, but will remain available to this patient, the nursing and medical teams.  Please re-consult if needed. Thanks, Maudie Flakes, MSN, RN, [place], [place], [place] [phone])

## 2013-12-11 NOTE — Discharge Instructions (Signed)
Use Darco shoe for ambulation left foot. Wash foot with soap and water daily. Use antibiotic ointment and dry dressing daily. Bring shoes and orthotics to followup appointment with Dr. Sharol Given.

## 2013-12-12 LAB — BASIC METABOLIC PANEL
Anion gap: 10 (ref 5–15)
BUN: 20 mg/dL (ref 6–23)
CO2: 25 mEq/L (ref 19–32)
Calcium: 8.4 mg/dL (ref 8.4–10.5)
Chloride: 105 mEq/L (ref 96–112)
Creatinine, Ser: 2.06 mg/dL — ABNORMAL HIGH (ref 0.50–1.35)
GFR, EST AFRICAN AMERICAN: 42 mL/min — AB (ref >90)
GFR, EST NON AFRICAN AMERICAN: 36 mL/min — AB (ref >90)
Glucose, Bld: 139 mg/dL — ABNORMAL HIGH (ref 70–99)
POTASSIUM: 3.9 meq/L (ref 3.7–5.3)
SODIUM: 140 meq/L (ref 137–147)

## 2013-12-12 LAB — GLUCOSE, CAPILLARY
GLUCOSE-CAPILLARY: 177 mg/dL — AB (ref 70–99)
Glucose-Capillary: 121 mg/dL — ABNORMAL HIGH (ref 70–99)
Glucose-Capillary: 170 mg/dL — ABNORMAL HIGH (ref 70–99)
Glucose-Capillary: 149 mg/dL — ABNORMAL HIGH (ref 70–99)

## 2013-12-12 LAB — URINE CULTURE
COLONY COUNT: NO GROWTH
CULTURE: NO GROWTH

## 2013-12-12 LAB — CBC
HCT: 38.5 % — ABNORMAL LOW (ref 39.0–52.0)
Hemoglobin: 12.8 g/dL — ABNORMAL LOW (ref 13.0–17.0)
MCH: 31.1 pg (ref 26.0–34.0)
MCHC: 33.2 g/dL (ref 30.0–36.0)
MCV: 93.4 fL (ref 78.0–100.0)
PLATELETS: 185 10*3/uL (ref 150–400)
RBC: 4.12 MIL/uL — ABNORMAL LOW (ref 4.22–5.81)
RDW: 14.4 % (ref 11.5–15.5)
WBC: 4.7 10*3/uL (ref 4.0–10.5)

## 2013-12-12 MED ORDER — LABETALOL HCL 300 MG PO TABS
300.0000 mg | ORAL_TABLET | Freq: Two times a day (BID) | ORAL | Status: DC
  Administered 2013-12-12 – 2013-12-13 (×2): 300 mg via ORAL
  Filled 2013-12-12 (×4): qty 1

## 2013-12-12 MED ORDER — HYDRALAZINE HCL 25 MG PO TABS
25.0000 mg | ORAL_TABLET | Freq: Three times a day (TID) | ORAL | Status: DC
  Administered 2013-12-12 – 2013-12-13 (×3): 25 mg via ORAL
  Filled 2013-12-12 (×6): qty 1

## 2013-12-12 MED ORDER — HYDRALAZINE HCL 20 MG/ML IJ SOLN
10.0000 mg | Freq: Four times a day (QID) | INTRAMUSCULAR | Status: DC | PRN
  Administered 2013-12-12 – 2013-12-13 (×2): 10 mg via INTRAVENOUS
  Filled 2013-12-12: qty 1

## 2013-12-12 MED ORDER — AMLODIPINE BESYLATE 10 MG PO TABS: 10.0000 mg | ORAL_TABLET | Freq: Every day | ORAL | Status: DC

## 2013-12-12 MED ORDER — SALINE SPRAY 0.65 % NA SOLN
1.0000 | NASAL | Status: DC | PRN
  Filled 2013-12-12: qty 44

## 2013-12-12 MED ORDER — AMLODIPINE BESYLATE 10 MG PO TABS
10.0000 mg | ORAL_TABLET | Freq: Every day | ORAL | Status: DC
  Administered 2013-12-12 – 2013-12-13 (×2): 10 mg via ORAL
  Filled 2013-12-12 (×3): qty 1

## 2013-12-12 NOTE — Progress Notes (Addendum)
[facility] PROGRESS NOTE  Cody Webster [id number] DOB: [birth year] DOA: 12/10/2013 PCP: Marry Guan  Assessment/Plan: #1 diabetic foot ulcer Clinical improvement. No tenderness to palpation. Patient is currently afebrile.white count is normal.MRI of the left foot with cellulitis and myositis without discrete drainable abscess. Findings suspicious for osteomyelitis involving the fourth and fifth metatarsal heads and the proximal phalanx of the fifth toe.CRP is less than 0.5. Sedimentation rate is 25. Continue empiric IV vancomycin IV Zosyn. Patient has been to orthopedics and recommended outpatient followup with continuation of oral doxycycline on discharge.  #2 chronic kidney disease Baseline unknown. Renal function trending down. Continue gentle hydration. Urinalysis with greater than 300 protein. Renal ultrasound consistent with chronic medical renal disease with no hydronephrosis. Follow.  #3 well controlled diabetes mellitus/peripheral neuropathy Hemoglobin A1c 6.2. CBGs have ranged from 121- 177. Continue carb modified diet. Continue Neurontin. Continue sliding scale insulin.  #4 hypertension Patient still with elevated blood pressure. Coreg dose has been increased to 25 mg twice daily. Norvasc has been added at 10 mg daily. Will at hydralazine 25 mg 3 times daily and titrate as needed. Will likely need further outpatient titration.  #5 tobacco abuse Tobacco cessation.continue nicotine patch.  #6 prophylaxis Heparin for DVT prophylaxis.  Code Status: per chart DO NOT RESUSCITATE Family Communication: updated patient no family at bedside. Disposition Plan: home when medically stable.   Consultants:  Orthopedics: Dr. Sharol Given 12/11/2013  Procedures:  MRI left foot 12/11/2013  X-ray left foot 12/10/2013  Renal ultrasound 12/11/2013  Antibiotics:  IV Zosyn 12/11/2013  IV vancomycin 12/11/2013  HPI/Subjective: Patient with no complaints.  Objective: Filed  Vitals:   12/12/13 1400  BP: 170/87  Pulse: 91  Temp: 98.4 F (36.9 C)  Resp: 18    Intake/Output Summary (Last 24 hours) at 12/12/13 1508 Last data filed at 12/11/13 1801  Gross per 24 hour  Intake    150 ml  Output      0 ml  Net    150 ml   Filed Weights   12/10/13 1856  Weight: 103.42 kg (228 lb)    Exam:   General:  NAD  Cardiovascular: RRR  Respiratory: CTAB  Abdomen: soft, nontender, nondistended, positive bowel sounds.  Musculoskeletal: no clubbing cyanosis or edema. Left plantar foot with dime-sized ulcer with decreased tenderness to palpation. No drainage noted.  Data Reviewed: Basic Metabolic Panel:  Recent Labs Lab 12/10/13 1544 12/11/13 0358 12/12/13 0455  NA 137 139 140  K 4.2 3.7 3.9  CL 103 103 105  CO2 23 24 25   GLUCOSE 123* 152* 139*  BUN 28* 22 20  CREATININE 2.33* 2.04* 2.06*  CALCIUM 8.6 8.9 8.4  MG  --  1.8  --   PHOS  --  2.9  --    Liver Function Tests: No results found for this basename: AST, ALT, ALKPHOS, BILITOT, PROT, ALBUMIN,  in the last 168 hours No results found for this basename: LIPASE, AMYLASE,  in the last 168 hours No results found for this basename: AMMONIA,  in the last 168 hours CBC:  Recent Labs Lab 12/10/13 1544 12/11/13 0358 12/12/13 0455  WBC 6.1 6.7 4.7  NEUTROABS 3.6  --   --   HGB 13.0 14.2 12.8*  HCT 38.2* 42.4 38.5*  MCV 92.5 93.8 93.4  PLT 187 209 185   Cardiac Enzymes: No results found for this basename: CKTOTAL, CKMB, CKMBINDEX, TROPONINI,  in the last 168 hours BNP (last 3 results) No results found for this  basename: PROBNP,  in the last 8760 hours CBG:  Recent Labs Lab 12/11/13 1159 12/11/13 1632 12/11/13 2223 12/12/13 0717 12/12/13 1223  GLUCAP 150* 190* 155* 177* 121*    Recent Results (from the past 240 hour(s))  CULTURE, BLOOD (ROUTINE X 2)     Status: None   Collection Time    12/10/13  7:33 PM      Result Value Ref Range Status   Specimen Description BLOOD RIGHT  ANTECUBITAL   Final   Special Requests BOTTLES DRAWN AEROBIC AND ANAEROBIC 5CC   Final   Culture  Setup Time     Final   Value: 12/10/2013 22:31     Performed at [facility]   Culture     Final   Value:        BLOOD CULTURE RECEIVED NO GROWTH TO DATE CULTURE WILL BE HELD FOR 5 DAYS BEFORE ISSUING A FINAL NEGATIVE REPORT     Performed at [facility]   Report Status PENDING   Incomplete  CULTURE, BLOOD (ROUTINE X 2)     Status: None   Collection Time    12/10/13  7:52 PM      Result Value Ref Range Status   Specimen Description BLOOD LEFT ANTECUBITAL   Final   Special Requests BOTTLES DRAWN AEROBIC AND ANAEROBIC 5 ML   Final   Culture  Setup Time     Final   Value: 12/10/2013 22:31     Performed at [facility]   Culture     Final   Value:        BLOOD CULTURE RECEIVED NO GROWTH TO DATE CULTURE WILL BE HELD FOR 5 DAYS BEFORE ISSUING A FINAL NEGATIVE REPORT     Performed at [facility]   Report Status PENDING   Incomplete     Studies: US Renal  12/11/2013   CLINICAL DATA:  Acute renal failure superimposed on chronic kidney disease.  EXAM: RENAL/URINARY TRACT ULTRASOUND COMPLETE  COMPARISON:  None.  FINDINGS: Right Kidney:  Length: 12.6 cm. There is increased renal cortical echogenicity without significant thinning. No hydronephrosis or focal cortical lesion identified.  Left Kidney:  Length: 13.2 cm. The renal cortical echogenicity may be mildly increased. There is a cyst in the lower interpolar region, measuring 3.5 x 2.8 x 2.7 cm. No hydronephrosis.  Bladder:  Decompressed, likely accounting for suggested wall thickening.  IMPRESSION: 1. Both kidneys demonstrate probable increased echogenicity, suggesting chronic medical renal disease. 2. No hydronephrosis. 3. Left renal cyst.   Electronically Signed   By: Camie Patience M.D.   On: 12/11/2013 16:00   Mr Foot Left Wo Contrast  12/11/2013   CLINICAL DATA:  Nonhealing ulcer.  EXAM: MRI OF THE LEFT FOREFOOT  WITHOUT CONTRAST  TECHNIQUE: Multiplanar, multisequence MR imaging was performed. No intravenous contrast was administered.  COMPARISON:  Radiographs 12/10/2013.  FINDINGS: Examination is limited. The patient would not complete the examination. There is also motion artifact.  Moderate soft tissue thickening noted along the plantar and lateral aspect of the forefoot mainly around the region of the fourth and fifth metatarsal heads. I do not see a discrete fluid collection to suggest a drainable abscess.  Signal abnormality in the plantar aspects of the fourth and fifth metatarsal head is could be a stress reaction or early osteomyelitis. Similar findings involving the proximal phalanx of the fifth toe.  Diffuse edema like signal abnormality in the forefoot musculature consistent with myositis.  IMPRESSION: 1. Limited  examination. 2. Cellulitis and myositis without discrete drainable abscess. 3. Findings suspicious for osteomyelitis involving the fourth and fifth metatarsal heads and the proximal phalanx of the fifth toe.   Electronically Signed   By: Kalman Jewels M.D.   On: 12/11/2013 10:12   Dg Foot Complete Left  12/10/2013   CLINICAL DATA:  Nonhealing ulcer, diabetes  EXAM: LEFT FOOT - COMPLETE 3+ VIEW  COMPARISON:  11/04/2011  FINDINGS: Normal alignment without acute fracture. Lucency within the soft tissues of the left foot along the fifth toe at the MTP joint and proximal phalanx on the oblique view, suspect ulceration. No underlying bone loss or periostitis. Relatively preserved joint spaces. No significant arthropathy. No radiopaque foreign body.  IMPRESSION: No definite acute osseous finding by plain radiography.   Electronically Signed   By: Daryll Brod M.D.   On: 12/10/2013 16:05    Scheduled Meds: . amLODipine  10 mg Oral Daily  . carvedilol  12.5 mg Oral Once  . carvedilol  25 mg Oral BID WC  . docusate sodium  100 mg Oral BID  . fentaNYL  100 mcg Intravenous Once  . gabapentin  100 mg  Oral TID  . heparin  5,000 Units Subcutaneous 3 times per day  . hydrALAZINE  25 mg Oral 3 times per day  . insulin aspart  0-15 Units Subcutaneous TID WC  . insulin aspart  0-5 Units Subcutaneous QHS  . nicotine  14 mg Transdermal Daily  . piperacillin-tazobactam (ZOSYN)  IV  3.375 g Intravenous Q8H  . vancomycin  1,500 mg Intravenous Q24H   Continuous Infusions:   Principal Problem:   Diabetic foot ulcer Active Problems:   Diabetes   HTN (hypertension)   Peripheral neuropathy   CKD (chronic kidney disease)    Time spent: [place] MD [facility] Pager [phone]. If 7PM-7AM, please contact night-coverage at [email or web address], password [facility] 12/12/2013, 3:08 PM  LOS: 2 days

## 2013-12-12 NOTE — Clinical Documentation Improvement (Signed)
PLEASE SPECIFY TYPE & ACUITY OF CHF: Possible Clinical Conditions? Chronic Systolic Congestive Heart Failure Chronic Diastolic Congestive Heart Failure Chronic Systolic & Diastolic Congestive Heart Failure Acute Systolic Congestive Heart Failure Acute Diastolic Congestive Heart Failure Acute Systolic & Diastolic Congestive Heart Failure Acute on Chronic Systolic Congestive Heart Failure Acute on Chronic Diastolic Congestive Heart Failure Acute on Chronic Systolic & Diastolic Congestive Heart Failure Other Condition Cannot Clinically Determine  Supporting Information:( AS PER NOTES)" Hx of CHF"  Thank You, Alessandra Grout, RN, BSN, CCDS,Clinical Documentation Specialist:  [phone]  [phone]=[facility]- Health Information Management

## 2013-12-13 ENCOUNTER — Telehealth: Payer: Self-pay | Admitting: Emergency Medicine

## 2013-12-13 LAB — BASIC METABOLIC PANEL
Anion gap: 10 (ref 5–15)
BUN: 18 mg/dL (ref 6–23)
CALCIUM: 8.5 mg/dL (ref 8.4–10.5)
CO2: 26 mEq/L (ref 19–32)
CREATININE: 2.04 mg/dL — AB (ref 0.50–1.35)
Chloride: 105 mEq/L (ref 96–112)
GFR calc Af Amer: 42 mL/min — ABNORMAL LOW (ref >90)
GFR, EST NON AFRICAN AMERICAN: 36 mL/min — AB (ref >90)
GLUCOSE: 132 mg/dL — AB (ref 70–99)
POTASSIUM: 3.8 meq/L (ref 3.7–5.3)
Sodium: 141 mEq/L (ref 137–147)

## 2013-12-13 LAB — GLUCOSE, CAPILLARY: GLUCOSE-CAPILLARY: 136 mg/dL — AB (ref 70–99)

## 2013-12-13 MED ORDER — AMLODIPINE BESYLATE 10 MG PO TABS: 10.0000 mg | ORAL_TABLET | Freq: Every day | ORAL | Status: DC

## 2013-12-13 MED ORDER — HYDRALAZINE HCL 25 MG PO TABS: 25.0000 mg | ORAL_TABLET | Freq: Three times a day (TID) | ORAL | Status: DC

## 2013-12-13 MED ORDER — LABETALOL HCL 300 MG PO TABS: 300.0000 mg | ORAL_TABLET | Freq: Two times a day (BID) | ORAL | Status: DC

## 2013-12-13 MED ORDER — OXYCODONE-ACETAMINOPHEN 5-325 MG PO TABS: 1.0000 | ORAL_TABLET | ORAL | Status: DC | PRN

## 2013-12-13 MED ORDER — NICOTINE 14 MG/24HR TD PT24: 14.0000 mg | MEDICATED_PATCH | Freq: Every day | TRANSDERMAL | Status: DC

## 2013-12-13 MED ORDER — DSS 100 MG PO CAPS: 100.0000 mg | ORAL_CAPSULE | Freq: Two times a day (BID) | ORAL | Status: DC

## 2013-12-13 MED ORDER — DOXYCYCLINE HYCLATE 100 MG PO TABS
100.0000 mg | ORAL_TABLET | Freq: Two times a day (BID) | ORAL | Status: DC
  Administered 2013-12-13: 100 mg via ORAL
  Filled 2013-12-13 (×2): qty 1

## 2013-12-13 NOTE — Progress Notes (Signed)
[facility] for scheduling appt message eft for scheduler to return call to [phone] and to call patient

## 2013-12-13 NOTE — Progress Notes (Signed)
[id number]/information for the [facility] with phone numbers and recent email given to patient for reference/pt is to call the clinic for appt if no return call by 2pm today.

## 2013-12-13 NOTE — Discharge Summary (Signed)
Physician Discharge Summary  Fon Rijo [id number] DOB: 01-28-1966 DOA: 12/10/2013  PCP: Marry Guan  Admit date: 12/10/2013 Discharge date: 12/13/2013  Time spent: 65 minutes  Recommendations for Outpatient Follow-up:  1. [facility] in one to 2 weeks. On follow up patient's diabetes will need to be assessed. Patient's blood pressure also needs to be assessed. Patient's chronic kidney function will also need to be followed up upon. Patient will need a basic metabolic profile done to follow up on his electrolytes and renal function.  Discharge Diagnoses:  Principal Problem:   Diabetic foot ulcer Active Problems:   Diabetes   HTN (hypertension)   Peripheral neuropathy   CKD (chronic kidney disease)   Discharge Condition: stable and improved  Diet recommendation: carb modified  Filed Weights   12/10/13 1856  Weight: 103.42 kg (228 lb)    History of present illness:  This is a 48y/o gentle man with a h/o diabetes and diabetic foot ulcer. 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 He has a diabetetic foot ulcer on the sole of the left foot approximately 60months now. He was being seen at wound care in [place] but moved to [place] approximately 2 months ago. He has not been to see a doctor wound care since. He's not on any diabetic medications he states this was discontinued when he was in [place] due to an elevated creatinine and was not restarted. He was on metformin. He states mostly ranges in width in the low 100s. He states approximately 2 days prior to admission, he started developing drainage from his foot ulcer. He has increased pain and warmth. He denied any fever, chills, nausea, vomiting. He came to the ER. He stated he had studies previously to evaluate circulation and these have been normal.    Hospital Course:  #1 diabetic foot ulcer  Patient presented with a diabetic foot ulcer with some warmth and  pain. Plain films of the foot had no acute abnormalities. Patient remained afebrile with a normal white count. MRI of the left foot with cellulitis and myositis without discrete drainable abscess. Findings suspicious for osteomyelitis involving the fourth and fifth metatarsal heads and the proximal phalanx of the fifth toe.CRP is less than 0.5. Sedimentation rate is 25. Patient was placed empirically on IV vancomycin IV Zosyn and orthopedic consultation was obtained. Patient was seen in consultation by Dr. Sharol Given who felt patient had a Wagner grade 1 ulcer of the left foot fourth metatarsal head with no definite osteomyelitis or abscess. It was recommended to continue IV antibiotics during the hospitalization and to discharge patient back on his home regimen of doxycycline. Antibiotic ointment daily was recommended and patient was to continue with his Darco shoe. Patient will follow up with orthopedics 2 weeks post discharge. Patient was discharged in stable and improved condition. #2 chronic kidney disease  Baseline unknown. On admission patient was noted to have a creatinine of 2.33. Patient was hydrated gently with IV fluids. Patient's renal function seemed to plateau around 2.04. Urinalysis with greater than 300 protein. Renal ultrasound consistent with chronic medical renal disease with no hydronephrosis. Patient's renal function remained stable patient will follow up as outpatient. #3 well controlled diabetes mellitus/peripheral neuropathy  Hemoglobin A1c 6.2. CBGs have ranged from 121- 177. Patient was maintained on a carb modified diet as well as Neurontin for his peripheral neuropathy. Patient was also placed on a sliding scale insulin. Patient will be discharged on diet and exercise for  diabetes management and is to followup with PCP as outpatient for further management of his diabetes. #4 hypertension  Patient during the hospitalization and significantly elevated blood pressure with systolic blood  pressures in the 200s. Patient had initially been on Coreg prior to admission dose was increased to 25 mg twice daily and Norvasc 10 mg added. Patient still had elevated blood pressures greater than [id number] systolic. Coreg was discontinued and patient was started on labetalol and hydralazine added. Patient's blood pressure improved significantly on this regimen. Patient will be discharged home on labetalol, Norvasc, hydralazine. Patient is to follow up with PCP as outpatient for further management. #5 tobacco abuse  Tobacco cessation. Patient was placed on a nicotine patch.   Procedures: MRI left foot 12/11/2013  X-ray left foot 12/10/2013  Renal ultrasound 12/11/2013   Consultations: Orthopedics: Dr. Sharol Given 12/11/2013   Discharge Exam: Filed Vitals:   12/13/13 0630  BP: 133/55  Pulse:   Temp:   Resp:     General: NAD Cardiovascular: RRR Respiratory: CTAB  Discharge Instructions You were cared for by a hospitalist during your hospital stay. If you have any questions about your discharge medications or the care you received while you were in the hospital after you are discharged, you can call the unit and asked to speak with the hospitalist on call if the hospitalist that took care of you is not available. Once you are discharged, your primary care physician will handle any further medical issues. Please note that NO REFILLS for any discharge medications will be authorized once you are discharged, as it is imperative that you return to your primary care physician (or establish a relationship with a primary care physician if you do not have one) for your aftercare needs so that they can reassess your need for medications and monitor your lab values.      Discharge Instructions   Diet Carb Modified    Complete by:  As directed      Discharge instructions    Complete by:  As directed   Follow up with community [facility] center in 1 week. Follow up with Dr Sharol Given in 2 weeks.     Increase  activity slowly    Complete by:  As directed             Medication List    STOP taking these medications       carvedilol 12.5 MG tablet  Commonly known as:  COREG      TAKE these medications       amLODipine 10 MG tablet  Commonly known as:  NORVASC  Take 1 tablet (10 mg total) by mouth daily.     doxycycline 100 MG tablet  Commonly known as:  VIBRA-TABS  Take 100 mg by mouth 2 (two) times daily.     DSS 100 MG Caps  Take 100 mg by mouth 2 (two) times daily.     gabapentin 100 MG capsule  Commonly known as:  NEURONTIN  Take 100 mg by mouth 3 (three) times daily.     hydrALAZINE 25 MG tablet  Commonly known as:  APRESOLINE  Take 1 tablet (25 mg total) by mouth every 8 (eight) hours.     labetalol 300 MG tablet  Commonly known as:  NORMODYNE  Take 1 tablet (300 mg total) by mouth 2 (two) times daily.     nicotine 14 mg/24hr patch  Commonly known as:  NICODERM CQ - dosed in mg/24 hours  Place 1 patch (14  mg total) onto the skin daily.     oxyCODONE-acetaminophen 5-325 MG per tablet  Commonly known as:  ROXICET  Take 1-2 tablets by mouth every 4 (four) hours as needed for severe pain.       Allergies  Allergen Reactions  . Influenza Vaccines     Chest congestion, fever  . Pneumococcal Vaccines     Nausea, vomiting, fever  . Morphine And Related Other (See Comments)   Follow-up Information   Follow up with DUDA,MARCUS V, MD In 2 weeks.   Specialty:  Orthopedic Surgery   Contact information:   [place] [address] [phone]       Follow up with [facility]    . Schedule an appointment as soon as possible for a visit in 1 week. (f/u in 1-2 weeks.)    Contact information:   [place] [address] [phone]       The results of significant diagnostics from this hospitalization (including imaging, microbiology, ancillary and laboratory) are listed below for reference.     Significant Diagnostic Studies: US Renal  12/11/2013   CLINICAL DATA:  Acute renal failure superimposed on chronic kidney disease.  EXAM: RENAL/URINARY TRACT ULTRASOUND COMPLETE  COMPARISON:  None.  FINDINGS: Right Kidney:  Length: 12.6 cm. There is increased renal cortical echogenicity without significant thinning. No hydronephrosis or focal cortical lesion identified.  Left Kidney:  Length: 13.2 cm. The renal cortical echogenicity may be mildly increased. There is a cyst in the lower interpolar region, measuring 3.5 x 2.8 x 2.7 cm. No hydronephrosis.  Bladder:  Decompressed, likely accounting for suggested wall thickening.  IMPRESSION: 1. Both kidneys demonstrate probable increased echogenicity, suggesting chronic medical renal disease. 2. No hydronephrosis. 3. Left renal cyst.   Electronically Signed   By: Camie Patience M.D.   On: 12/11/2013 16:00   Mr Foot Left Wo Contrast  12/11/2013   CLINICAL DATA:  Nonhealing ulcer.  EXAM: MRI OF THE LEFT FOREFOOT WITHOUT CONTRAST  TECHNIQUE: Multiplanar, multisequence MR imaging was performed. No intravenous contrast was administered.  COMPARISON:  Radiographs 12/10/2013.  FINDINGS: Examination is limited. The patient would not complete the examination. There is also motion artifact.  Moderate soft tissue thickening noted along the plantar and lateral aspect of the forefoot mainly around the region of the fourth and fifth metatarsal heads. I do not see a discrete fluid collection to suggest a drainable abscess.  Signal abnormality in the plantar aspects of the fourth and fifth metatarsal head is could be a stress reaction or early osteomyelitis. Similar findings involving the proximal phalanx of the fifth toe.  Diffuse edema like signal abnormality in the forefoot musculature consistent with myositis.  IMPRESSION: 1. Limited examination. 2. Cellulitis and myositis without discrete drainable abscess. 3. Findings suspicious for osteomyelitis involving the fourth and  fifth metatarsal heads and the proximal phalanx of the fifth toe.   Electronically Signed   By: Kalman Jewels M.D.   On: 12/11/2013 10:12   Dg Foot Complete Left  12/10/2013   CLINICAL DATA:  Nonhealing ulcer, diabetes  EXAM: LEFT FOOT - COMPLETE 3+ VIEW  COMPARISON:  11/04/2011  FINDINGS: Normal alignment without acute fracture. Lucency within the soft tissues of the left foot along the fifth toe at the MTP joint and proximal phalanx on the oblique view, suspect ulceration. No underlying bone loss or periostitis. Relatively preserved joint spaces. No significant arthropathy. No radiopaque foreign body.  IMPRESSION:  No definite acute osseous finding by plain radiography.   Electronically Signed   By: Daryll Brod M.D.   On: 12/10/2013 16:05    Microbiology: Recent Results (from the past 240 hour(s))  CULTURE, BLOOD (ROUTINE X 2)     Status: None   Collection Time    12/10/13  7:33 PM      Result Value Ref Range Status   Specimen Description BLOOD RIGHT ANTECUBITAL   Final   Special Requests BOTTLES DRAWN AEROBIC AND ANAEROBIC 5CC   Final   Culture  Setup Time     Final   Value: 12/10/2013 22:31     Performed at [facility]   Culture     Final   Value:        BLOOD CULTURE RECEIVED NO GROWTH TO DATE CULTURE WILL BE HELD FOR 5 DAYS BEFORE ISSUING A FINAL NEGATIVE REPORT     Performed at [facility]   Report Status PENDING   Incomplete  CULTURE, BLOOD (ROUTINE X 2)     Status: None   Collection Time    12/10/13  7:52 PM      Result Value Ref Range Status   Specimen Description BLOOD LEFT ANTECUBITAL   Final   Special Requests BOTTLES DRAWN AEROBIC AND ANAEROBIC 5 ML   Final   Culture  Setup Time     Final   Value: 12/10/2013 22:31     Performed at [facility]   Culture     Final   Value:        BLOOD CULTURE RECEIVED NO GROWTH TO DATE CULTURE WILL BE HELD FOR 5 DAYS BEFORE ISSUING A FINAL NEGATIVE REPORT     Performed at [facility]   Report Status  PENDING   Incomplete  URINE CULTURE     Status: None   Collection Time    12/11/13 10:44 AM      Result Value Ref Range Status   Specimen Description URINE, CLEAN CATCH   Final   Special Requests NONE   Final   Culture  Setup Time     Final   Value: 12/11/2013 15:13     Performed at [facility]     Final   Value: NO GROWTH     Performed at [facility]   Culture     Final   Value: NO GROWTH     Performed at [facility]   Report Status 12/12/2013 FINAL   Final     Labs: Basic Metabolic Panel:  Recent Labs Lab 12/10/13 1544 12/11/13 0358 12/12/13 0455 12/13/13 0500  NA 137 139 140 141  K 4.2 3.7 3.9 3.8  CL 103 103 105 105  CO2 23 24 25 26   GLUCOSE 123* 152* 139* 132*  BUN 28* 22 20 18   CREATININE 2.33* 2.04* 2.06* 2.04*  CALCIUM 8.6 8.9 8.4 8.5  MG  --  1.8  --   --   PHOS  --  2.9  --   --    Liver Function Tests: No results found for this basename: AST, ALT, ALKPHOS, BILITOT, PROT, ALBUMIN,  in the last 168 hours No results found for this basename: LIPASE, AMYLASE,  in the last 168 hours No results found for this basename: AMMONIA,  in the last 168 hours CBC:  Recent Labs Lab 12/10/13 1544 12/11/13 0358 12/12/13 0455  WBC 6.1 6.7 4.7  NEUTROABS 3.6  --   --  HGB 13.0 14.2 12.8*  HCT 38.2* 42.4 38.5*  MCV 92.5 93.8 93.4  PLT 187 209 185   Cardiac Enzymes: No results found for this basename: CKTOTAL, CKMB, CKMBINDEX, TROPONINI,  in the last 168 hours BNP: BNP (last 3 results) No results found for this basename: PROBNP,  in the last 8760 hours CBG:  Recent Labs Lab 12/12/13 0717 12/12/13 1223 12/12/13 1624 12/12/13 2244 12/13/13 0727  GLUCAP 177* 121* 170* 149* 136*       Signed:  THOMPSON,DANIEL MD [facility] 12/13/2013, 10:11 AM

## 2013-12-16 ENCOUNTER — Inpatient Hospital Stay: Payer: Medicaid Other | Admitting: Internal Medicine

## 2013-12-16 LAB — CULTURE, BLOOD (ROUTINE X 2)
Culture: NO GROWTH
Culture: NO GROWTH

## 2014-07-11 ENCOUNTER — Encounter (HOSPITAL_COMMUNITY): Payer: Self-pay

## 2014-07-11 ENCOUNTER — Emergency Department (HOSPITAL_COMMUNITY): Payer: Medicaid Other

## 2014-07-11 ENCOUNTER — Inpatient Hospital Stay (HOSPITAL_COMMUNITY)
Admission: EM | Admit: 2014-07-11 | Discharge: 2014-07-12 | DRG: 103 | Discharge status: Against Medical Advice | Payer: Medicaid Other | Attending: Internal Medicine | Admitting: Internal Medicine

## 2014-07-11 DIAGNOSIS — Z885 Allergy status to narcotic agent status: Secondary | ICD-10-CM | POA: Diagnosis not present

## 2014-07-11 DIAGNOSIS — N182 Chronic kidney disease, stage 2 (mild): Secondary | ICD-10-CM

## 2014-07-11 DIAGNOSIS — Z66 Do not resuscitate: Secondary | ICD-10-CM | POA: Diagnosis present

## 2014-07-11 DIAGNOSIS — Z79899 Other long term (current) drug therapy: Secondary | ICD-10-CM | POA: Diagnosis not present

## 2014-07-11 DIAGNOSIS — I6529 Occlusion and stenosis of unspecified carotid artery: Secondary | ICD-10-CM | POA: Diagnosis present

## 2014-07-11 DIAGNOSIS — I129 Hypertensive chronic kidney disease with stage 1 through stage 4 chronic kidney disease, or unspecified chronic kidney disease: Secondary | ICD-10-CM | POA: Diagnosis present

## 2014-07-11 DIAGNOSIS — R51 Headache: Secondary | ICD-10-CM

## 2014-07-11 DIAGNOSIS — N183 Chronic kidney disease, stage 3 (moderate): Secondary | ICD-10-CM | POA: Diagnosis present

## 2014-07-11 DIAGNOSIS — I509 Heart failure, unspecified: Secondary | ICD-10-CM | POA: Diagnosis present

## 2014-07-11 DIAGNOSIS — Z8249 Family history of ischemic heart disease and other diseases of the circulatory system: Secondary | ICD-10-CM | POA: Diagnosis not present

## 2014-07-11 DIAGNOSIS — R270 Ataxia, unspecified: Secondary | ICD-10-CM

## 2014-07-11 DIAGNOSIS — I10 Essential (primary) hypertension: Secondary | ICD-10-CM | POA: Diagnosis present

## 2014-07-11 DIAGNOSIS — Z887 Allergy status to serum and vaccine status: Secondary | ICD-10-CM

## 2014-07-11 DIAGNOSIS — G44209 Tension-type headache, unspecified, not intractable: Principal | ICD-10-CM | POA: Diagnosis present

## 2014-07-11 DIAGNOSIS — E11621 Type 2 diabetes mellitus with foot ulcer: Secondary | ICD-10-CM | POA: Diagnosis present

## 2014-07-11 DIAGNOSIS — R519 Headache, unspecified: Secondary | ICD-10-CM | POA: Diagnosis present

## 2014-07-11 DIAGNOSIS — L97509 Non-pressure chronic ulcer of other part of unspecified foot with unspecified severity: Secondary | ICD-10-CM

## 2014-07-11 DIAGNOSIS — E1165 Type 2 diabetes mellitus with hyperglycemia: Secondary | ICD-10-CM | POA: Diagnosis present

## 2014-07-11 DIAGNOSIS — F17210 Nicotine dependence, cigarettes, uncomplicated: Secondary | ICD-10-CM | POA: Diagnosis present

## 2014-07-11 DIAGNOSIS — Z833 Family history of diabetes mellitus: Secondary | ICD-10-CM | POA: Diagnosis not present

## 2014-07-11 DIAGNOSIS — E1122 Type 2 diabetes mellitus with diabetic chronic kidney disease: Secondary | ICD-10-CM | POA: Diagnosis present

## 2014-07-11 DIAGNOSIS — I132 Hypertensive heart and chronic kidney disease with heart failure and with stage 5 chronic kidney disease, or end stage renal disease: Secondary | ICD-10-CM

## 2014-07-11 DIAGNOSIS — N184 Chronic kidney disease, stage 4 (severe): Secondary | ICD-10-CM | POA: Diagnosis present

## 2014-07-11 DIAGNOSIS — Z79891 Long term (current) use of opiate analgesic: Secondary | ICD-10-CM | POA: Diagnosis not present

## 2014-07-11 DIAGNOSIS — L97529 Non-pressure chronic ulcer of other part of left foot with unspecified severity: Secondary | ICD-10-CM

## 2014-07-11 DIAGNOSIS — I5030 Unspecified diastolic (congestive) heart failure: Secondary | ICD-10-CM

## 2014-07-11 DIAGNOSIS — N185 Chronic kidney disease, stage 5: Secondary | ICD-10-CM | POA: Diagnosis present

## 2014-07-11 DIAGNOSIS — R739 Hyperglycemia, unspecified: Secondary | ICD-10-CM | POA: Diagnosis present

## 2014-07-11 LAB — CBC
HCT: 38.6 % — ABNORMAL LOW (ref 39.0–52.0)
Hemoglobin: 13.4 g/dL (ref 13.0–17.0)
MCH: 31.2 pg (ref 26.0–34.0)
MCHC: 34.7 g/dL (ref 30.0–36.0)
MCV: 90 fL (ref 78.0–100.0)
Platelets: 194 10*3/uL (ref 150–400)
RBC: 4.29 MIL/uL (ref 4.22–5.81)
RDW: 12.7 % (ref 11.5–15.5)
WBC: 6.3 10*3/uL (ref 4.0–10.5)

## 2014-07-11 LAB — BASIC METABOLIC PANEL
ANION GAP: 9 (ref 5–15)
BUN: 17 mg/dL (ref 6–23)
CALCIUM: 8.4 mg/dL (ref 8.4–10.5)
CO2: 22 mmol/L (ref 19–32)
Chloride: 98 mmol/L (ref 96–112)
Creatinine, Ser: 2.66 mg/dL — ABNORMAL HIGH (ref 0.50–1.35)
GFR calc non Af Amer: 26 mL/min — ABNORMAL LOW (ref >90)
GFR, EST AFRICAN AMERICAN: 31 mL/min — AB (ref >90)
Glucose, Bld: 616 mg/dL (ref 70–99)
Potassium: 3.8 mmol/L (ref 3.5–5.1)
Sodium: 129 mmol/L — ABNORMAL LOW (ref 135–145)

## 2014-07-11 LAB — I-STAT TROPONIN, ED: TROPONIN I, POC: 0.02 ng/mL (ref 0.00–0.08)

## 2014-07-11 LAB — GLUCOSE, CAPILLARY
GLUCOSE-CAPILLARY: 140 mg/dL — AB (ref 70–99)
Glucose-Capillary: 66 mg/dL — ABNORMAL LOW (ref 70–99)

## 2014-07-11 LAB — CBG MONITORING, ED
GLUCOSE-CAPILLARY: 431 mg/dL — AB (ref 70–99)
Glucose-Capillary: 509 mg/dL — ABNORMAL HIGH (ref 70–99)
Glucose-Capillary: 361 mg/dL — ABNORMAL HIGH (ref 70–99)

## 2014-07-11 MED ORDER — DEXTROSE-NACL 5-0.45 % IV SOLN
INTRAVENOUS | Status: DC
  Administered 2014-07-11: via INTRAVENOUS

## 2014-07-11 MED ORDER — METFORMIN HCL 500 MG PO TABS: 500.0000 mg | ORAL_TABLET | Freq: Once | ORAL | Status: DC

## 2014-07-11 MED ORDER — ASPIRIN EC 81 MG PO TBEC
81.0000 mg | DELAYED_RELEASE_TABLET | Freq: Every day | ORAL | Status: DC
  Administered 2014-07-12: 81 mg via ORAL
  Filled 2014-07-11 (×2): qty 1

## 2014-07-11 MED ORDER — GABAPENTIN 100 MG PO CAPS
100.0000 mg | ORAL_CAPSULE | Freq: Three times a day (TID) | ORAL | Status: DC
  Administered 2014-07-12 (×2): 100 mg via ORAL
  Filled 2014-07-11 (×2): qty 1

## 2014-07-11 MED ORDER — AMLODIPINE BESYLATE 10 MG PO TABS
10.0000 mg | ORAL_TABLET | Freq: Every day | ORAL | Status: DC
  Administered 2014-07-12: 10 mg via ORAL
  Filled 2014-07-11: qty 1

## 2014-07-11 MED ORDER — ACETAMINOPHEN 650 MG RE SUPP: 650.0000 mg | Freq: Four times a day (QID) | RECTAL | Status: DC | PRN

## 2014-07-11 MED ORDER — SODIUM CHLORIDE 0.9 % IV SOLN
Freq: Once | INTRAVENOUS | Status: AC
  Administered 2014-07-11: 18:00:00 via INTRAVENOUS

## 2014-07-11 MED ORDER — ONDANSETRON HCL 4 MG PO TABS: 4.0000 mg | ORAL_TABLET | Freq: Four times a day (QID) | ORAL | Status: DC | PRN

## 2014-07-11 MED ORDER — SODIUM CHLORIDE 0.9 % IV SOLN
INTRAVENOUS | Status: DC
  Administered 2014-07-11: 20:00:00 via INTRAVENOUS

## 2014-07-11 MED ORDER — SODIUM CHLORIDE 0.9 % IV SOLN: INTRAVENOUS | Status: DC

## 2014-07-11 MED ORDER — LABETALOL HCL 300 MG PO TABS
300.0000 mg | ORAL_TABLET | Freq: Two times a day (BID) | ORAL | Status: DC
  Administered 2014-07-12 (×2): 300 mg via ORAL
  Filled 2014-07-11 (×3): qty 1

## 2014-07-11 MED ORDER — SODIUM CHLORIDE 0.9 % IV SOLN
INTRAVENOUS | Status: DC
  Administered 2014-07-11: 23:00:00 via INTRAVENOUS
  Administered 2014-07-11: 3.7 [IU]/h via INTRAVENOUS
  Filled 2014-07-11: qty 2.5

## 2014-07-11 MED ORDER — SODIUM CHLORIDE 0.9 % IJ SOLN
3.0000 mL | Freq: Two times a day (BID) | INTRAMUSCULAR | Status: DC
  Administered 2014-07-12: 3 mL via INTRAVENOUS

## 2014-07-11 MED ORDER — ACETAMINOPHEN 325 MG PO TABS: 650.0000 mg | ORAL_TABLET | Freq: Four times a day (QID) | ORAL | Status: DC | PRN

## 2014-07-11 MED ORDER — ONDANSETRON HCL 4 MG/2ML IJ SOLN: 4.0000 mg | Freq: Four times a day (QID) | INTRAMUSCULAR | Status: DC | PRN

## 2014-07-11 MED ORDER — SODIUM CHLORIDE 0.9 % IV BOLUS (SEPSIS)
1000.0000 mL | Freq: Once | INTRAVENOUS | Status: AC
  Administered 2014-07-11: 1000 mL via INTRAVENOUS

## 2014-07-11 MED ORDER — DOCUSATE SODIUM 100 MG PO CAPS
100.0000 mg | ORAL_CAPSULE | Freq: Two times a day (BID) | ORAL | Status: DC
  Administered 2014-07-12 (×2): 100 mg via ORAL
  Filled 2014-07-11 (×2): qty 1

## 2014-07-11 MED ORDER — INSULIN REGULAR BOLUS VIA INFUSION
0.0000 [IU] | Freq: Three times a day (TID) | INTRAVENOUS | Status: DC
  Filled 2014-07-11: qty 10

## 2014-07-11 MED ORDER — OXYCODONE-ACETAMINOPHEN 5-325 MG PO TABS: 1.0000 | ORAL_TABLET | ORAL | Status: DC | PRN

## 2014-07-11 MED ORDER — DEXTROSE 50 % IV SOLN
25.0000 mL | INTRAVENOUS | Status: DC | PRN
  Administered 2014-07-12: 25 mL via INTRAVENOUS

## 2014-07-11 NOTE — ED Notes (Signed)
Pt reporting right sided headache, "dotty vision" x few days. Pt alert, oriented, and answering questions appropriately.

## 2014-07-11 NOTE — ED Notes (Signed)
Pt transported to CT ?

## 2014-07-11 NOTE — ED Notes (Signed)
MD [place] at bedside.  

## 2014-07-11 NOTE — Progress Notes (Signed)
Pt alert x 4, tx from ED, no c/o pain skin dry, diabetic ulcer noted to lf foot the size of a 1/2 dollar covered with banaide and guaze, wound care consult ordered. Paged [facility] from [facility] order given to reschedule MRI until the am. Will continue to monitor Jeanie Sewer, RN 10:36 PM 07/11/2014

## 2014-07-11 NOTE — H&P (Addendum)
PCP: Marry Guan    Chief Complaint:  Headaches HPI: Cody Webster is a 49 y.o. male   has a past medical history of Hypertension; Diabetes mellitus without complication; and CHF (congestive heart failure).   Presented with  Patient reports for the past 3-4 days he have been having generalized headache, blurred vision, he have been having some increased urination, some occasional frothy urine. He has been off his metformin for the past 1 year since his diabetes got better.  Wife states he has had some fatigue. Patient reports seeing some bright lights, occasionally seeing patterns. Patient reports is for past 1 week he's been having episodes that start with ataxia then progressed to patient having trouble verbalizing his thoughts then he would get a headache or visual abnormalities after they changed things would resolve. She been having it frequently few times a day. He presented to ER was found to have blood glucose 616. ER has discussed care with ophthalmology who recommended out patient follow up. CT scan of the head showing no acute findings there is evidence of atherosclerotic calcification of current is carotid arteries bilaterally.  Of note patient has been told in the past that he has heart failure is no echogram of the system and not sure if anybody ever obtain echogram he is not Lasix reports occasional leg swelling but not now. He has long-standing history of chronic kidney disease supposed to be followed up with nephrology but states he was never referred. Patient has chronic left foot ulcer was followed by Dr. Sharol Given have seen him just today and felt that his ulcer looks better. There is no fever or chills no leg swelling.  Hospitalist was called for admission for symptomatic hyperglycemia  Review of Systems:    Pertinent positives include:   headaches, vision changes, fatigue,   Constitutional:  No weight loss, night sweats, Fevers, chills, weight loss  HEENT:    NoDifficulty swallowing,Tooth/dental problems,Sore throat,  No sneezing, itching, ear ache, nasal congestion, post nasal drip,  Cardio-vascular:  No chest pain, Orthopnea, PND, anasarca, dizziness, palpitations.no Bilateral lower extremity swelling  GI:  No heartburn, indigestion, abdominal pain, nausea, vomiting, diarrhea, change in bowel habits, loss of appetite, melena, blood in stool, hematemesis Resp:  no shortness of breath at rest. No dyspnea on exertion, No excess mucus, no productive cough, No non-productive cough, No coughing up of blood.No change in color of mucus.No wheezing. Skin:  no rash or lesions. No jaundice GU:  no dysuria, change in color of urine, no urgency or frequency. No straining to urinate.  No flank pain.  Musculoskeletal:  No joint pain or no joint swelling. No decreased range of motion. No back pain.  Psych:  No change in mood or affect. No depression or anxiety. No memory loss.  Neuro: no localizing neurological complaints, no tingling, no weakness, no double vision, no gait abnormality, no slurred speech, no confusion  Otherwise ROS are negative except for above, 10 systems were reviewed  Past Medical History: Past Medical History  Diagnosis Date  . Hypertension   . Diabetes mellitus without complication   . CHF (congestive heart failure)    Past Surgical History  Procedure Laterality Date  . Toe amputation    . Right foot surgery       Medications: Prior to Admission medications   Medication Sig Start Date End Date Taking? Authorizing Provider  amLODipine (NORVASC) 10 MG tablet Take 1 tablet (10 mg total) by mouth daily. 12/13/13  Yes Irine Seal  V, MD  gabapentin (NEURONTIN) 100 MG capsule Take 100 mg by mouth 3 (three) times daily.   Yes Historical Provider, MD  labetalol (NORMODYNE) 300 MG tablet Take 1 tablet (300 mg total) by mouth 2 (two) times daily. 12/13/13  Yes Eugenie Filler, MD  oxyCODONE-acetaminophen (ROXICET) 5-325 MG per  tablet Take 1-2 tablets by mouth every 4 (four) hours as needed for severe pain. 12/13/13  Yes Eugenie Filler, MD  docusate sodium 100 MG CAPS Take 100 mg by mouth 2 (two) times daily. Patient not taking: Reported on 07/11/2014 12/13/13   Eugenie Filler, MD  hydrALAZINE (APRESOLINE) 25 MG tablet Take 1 tablet (25 mg total) by mouth every 8 (eight) hours. Patient not taking: Reported on 07/11/2014 12/13/13   Eugenie Filler, MD  nicotine (NICODERM CQ - DOSED IN MG/24 HOURS) 14 mg/24hr patch Place 1 patch (14 mg total) onto the skin daily. Patient not taking: Reported on 07/11/2014 12/13/13   Eugenie Filler, MD    Allergies:   Allergies  Allergen Reactions  . Influenza Vaccines     Chest congestion, fever  . Pneumococcal Vaccines     Nausea, vomiting, fever  . Morphine And Related Other (See Comments)    Social History:  Ambulatory  independently   Lives at home alone  With family     reports that he has been smoking Cigarettes and Cigars.  He has a 15 pack-year smoking history. He quit smokeless tobacco use about 30 years ago. His smokeless tobacco use included Snuff and Chew. He reports that he drinks alcohol. He reports that he does not use illicit drugs.    Family History: family history includes CAD in his mother and sister; Diabetes type II in his brother and father; Hypertension in his sister.    Physical Exam: Patient Vitals for the past 24 hrs:  BP Temp Temp src Pulse Resp SpO2  07/11/14 1752 176/77 mmHg - - 74 16 99 %  07/11/14 1610 200/92 mmHg 98.2 F (36.8 C) Oral 79 16 99 %    1. General:  in No Acute distress 2. Psychological: Alert and   Oriented 3. Head/ENT:     Dry Mucous Membranes                          Head Non traumatic, neck supple                           Poor Dentition 4. SKIN:   decreased Skin turgor,  Skin clean Dry and intact dark discoloration to the bottom of feet bilaterally. Back 2 cm ulcer noted on the bottom of fifth metatarsal left  foot 5. Heart: Regular rate and rhythm no Murmur, Rub or gallop 6. Lungs: Clear to auscultation bilaterally, no wheezes or crackles   7. Abdomen: Soft, non-tender, Non distended 8. Lower extremities: no clubbing, cyanosis, or edema 9. Neurologically patient has a mild left pronator drift. Cranial nerves II through XII intact although mild nystagmus, Asian states that his left leg is stiff denies it being weaker although not able to lift it off the bed as much as his right one 10. MSK: Normal range of motion  body mass index is unknown because there is no weight on file.   Labs on Admission:   Results for orders placed or performed during the hospital encounter of 07/11/14 (from the past 24 hour(s))  CBC  Status: Abnormal   Collection Time: 07/11/14  4:12 PM  Result Value Ref Range   WBC 6.3 4.0 - 10.5 K/uL   RBC 4.29 4.22 - 5.81 MIL/uL   Hemoglobin 13.4 13.0 - 17.0 g/dL   HCT 38.6 (L) 39.0 - 52.0 %   MCV 90.0 78.0 - 100.0 fL   MCH 31.2 26.0 - 34.0 pg   MCHC 34.7 30.0 - 36.0 g/dL   RDW 12.7 11.5 - 15.5 %   Platelets 194 150 - 400 K/uL  Basic metabolic panel     Status: Abnormal   Collection Time: 07/11/14  4:12 PM  Result Value Ref Range   Sodium 129 (L) 135 - 145 mmol/L   Potassium 3.8 3.5 - 5.1 mmol/L   Chloride 98 96 - 112 mmol/L   CO2 22 19 - 32 mmol/L   Glucose, Bld 616 (HH) 70 - 99 mg/dL   BUN 17 6 - 23 mg/dL   Creatinine, Ser 2.66 (H) 0.50 - 1.35 mg/dL   Calcium 8.4 8.4 - 10.5 mg/dL   GFR calc non Af Amer 26 (L) >90 mL/min   GFR calc Af Amer 31 (L) >90 mL/min   Anion gap 9 5 - 15  I-stat troponin, ED (not at [facility])     Status: None   Collection Time: 07/11/14  4:21 PM  Result Value Ref Range   Troponin i, poc 0.02 0.00 - 0.08 ng/mL   Comment 3          POC CBG, ED     Status: Abnormal   Collection Time: 07/11/14  6:35 PM  Result Value Ref Range   Glucose-Capillary 509 (H) 70 - 99 mg/dL   Comment 1 Notify RN    Comment 2 Document in Chart   CBG monitoring, ED      Status: Abnormal   Collection Time: 07/11/14  8:02 PM  Result Value Ref Range   Glucose-Capillary 431 (H) 70 - 99 mg/dL    UA pending  Lab Results  Component Value Date   HGBA1C 6.2* 12/11/2013    CrCl cannot be calculated (Unknown ideal weight.).  BNP (last 3 results) No results for input(s): PROBNP in the last 8760 hours.  Other results:  I have pearsonaly reviewed this: ECG REPORT  Rate 79  Rhythm: NSR ST&T Change: T wave inversion lateral leads   There were no vitals filed for this visit.   Cultures:    Component Value Date/Time   SDES URINE, CLEAN CATCH 12/11/2013 1044   SPECREQUEST NONE 12/11/2013 1044   CULT NO GROWTH Performed at [facility] 12/11/2013 1044   REPTSTATUS 12/12/2013 FINAL 12/11/2013 1044     Radiological Exams on Admission: Ct Head Wo Contrast  07/11/2014   CLINICAL DATA:  Right-sided headache and vision alteration for the past few days. Diabetes and hypertension.  EXAM: CT HEAD WITHOUT CONTRAST  TECHNIQUE: Contiguous axial images were obtained from the base of the skull through the vertex without intravenous contrast.  COMPARISON:  07/01/2004  FINDINGS: The brainstem, cerebellum, cerebral peduncles, thalamus, basal ganglia, basilar cisterns, and ventricular system appear within normal limits. No intracranial hemorrhage, mass lesion, or acute CVA.  There is atherosclerotic calcification of the cavernous carotid arteries bilaterally.  IMPRESSION: 1.  No significant abnormality identified.   Electronically Signed   By: Van Clines M.D.   On: 07/11/2014 17:31    Chart has been reviewed  Assessment/Plan  49 year old gentleman with history of poorly controlled diabetes, hypertension,  chronic renal  failure presents with intermittent headaches and ataxia has been going on for past 1 week CT scan of the head unremarkable. Patient was noted to have severe hyperglycemia and poorly controlled hypertension   Present on Admission:  .  Frequent headaches/ ataxia/visual changes episodes - this has been persistent for the past 1 week most likely related to patient's poorly controlled blood glucose and hypertension. Nonetheless given the paroxysmal nature will try to father evaluate with MRI of the brain and EEG. Discussed with neurology who will follow-up. CVA being much less likely given that there is negative CT scan. Given the paroxysmal nature TIAs also less likely. Given visual changes outpatient follow-up with ophthalmology  . HTN (hypertension) -continue home medications  . Hyperglycemia -glucose stabilizer diabetes education patient will need to be restarted on his diabetes medications at the time of discharge  . 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 Close outpatient follow-up initiation of outpatient diabetes management  . CKD (chronic kidney disease), stage III -patient have had chronic decline of his kidney function. Frothy urine suggestion of proteinuria will obtain UA. Please schedule follow-up with nephrology  . Diabetic foot ulcer - full by Dr. Sharol Given currently stable History of CHF. Will obtain echogram, also be helpful in evaluation of his occasional spells   Prophylaxis:  Lovenox   CODE STATUS: DNR as per patient he is concerned that his family may not agree but feels strongly  Other plan as per orders.  I have spent a total of 65 min on this admission case was discussed with neurology will obtain further neurological work up.   [place] 07/11/2014, 8:19 PM  [facility]  Pager [phone]   after 2 AM please page floor coverage PA If 7AM-7PM, please contact the day team taking care of the patient  [email or web address]  Password [email or web address]

## 2014-07-11 NOTE — ED Provider Notes (Signed)
CSN: [id number]     Arrival date & time 07/11/14  1558 History   First MD Initiated Contact with Patient 07/11/14 [phone]     Chief Complaint  Patient presents with  . Chest Pain  . Blurred Vision     (Consider location/radiation/quality/duration/timing/severity/associated sxs/prior Treatment) Patient is a 49 y.o. male presenting with headaches.  Headache Pain location:  R temporal Quality:  Sharp Radiates to:  Does not radiate Pain severity now: mild. Pain scale at highest: severe. Onset quality:  Sudden Duration: 2-5 minutes. Timing:  Intermittent Progression:  Worsening Chronicity:  New Context: not activity and not exposure to bright light   Relieved by:  Nothing Worsened by:  Light Ineffective treatments: sudafed. Associated symptoms: photophobia and visual change   Associated symptoms: no abdominal pain, no fever and no neck stiffness   Associated symptoms comment:  No eye tearing or nasal drainage   Past Medical History  Diagnosis Date  . Hypertension   . Diabetes mellitus without complication   . CHF (congestive heart failure)    Past Surgical History  Procedure Laterality Date  . Toe amputation    . Right foot surgery     No family history on file. History  Substance Use Topics  . Smoking status: Current Every Day Smoker -- 0.50 packs/day for 30 years    Types: Cigarettes, Cigars  . Smokeless tobacco: Former Systems developer    Types: Snuff, Chew    Quit date: 12/11/1983  . Alcohol Use: Yes     Comment: [email or web address]     Review of Systems  Constitutional: Negative for fever.  Eyes: Positive for photophobia.  Cardiovascular: Positive for chest pain.  Gastrointestinal: Negative for abdominal pain.  Musculoskeletal: Negative for neck stiffness.  Neurological: Positive for headaches.  All other systems reviewed and are negative.     Allergies  Influenza vaccines; Pneumococcal vaccines; and Morphine and related  Home Medications   Prior to Admission  medications   Medication Sig Start Date End Date Taking? Authorizing Provider  amLODipine (NORVASC) 10 MG tablet Take 1 tablet (10 mg total) by mouth daily. 12/13/13   Eugenie Filler, MD  docusate sodium 100 MG CAPS Take 100 mg by mouth 2 (two) times daily. 12/13/13   Eugenie Filler, MD  doxycycline (VIBRA-TABS) 100 MG tablet Take 100 mg by mouth 2 (two) times daily.    Historical Provider, MD  gabapentin (NEURONTIN) 100 MG capsule Take 100 mg by mouth 3 (three) times daily.    Historical Provider, MD  hydrALAZINE (APRESOLINE) 25 MG tablet Take 1 tablet (25 mg total) by mouth every 8 (eight) hours. 12/13/13   Eugenie Filler, MD  labetalol (NORMODYNE) 300 MG tablet Take 1 tablet (300 mg total) by mouth 2 (two) times daily. 12/13/13   Eugenie Filler, MD  nicotine (NICODERM CQ - DOSED IN MG/24 HOURS) 14 mg/24hr patch Place 1 patch (14 mg total) onto the skin daily. 12/13/13   Eugenie Filler, MD  oxyCODONE-acetaminophen (ROXICET) 5-325 MG per tablet Take 1-2 tablets by mouth every 4 (four) hours as needed for severe pain. 12/13/13   Eugenie Filler, MD   BP 200/92 mmHg  Pulse 79  Temp(Src) 98.2 F (36.8 C) (Oral)  Resp 16  SpO2 99% Physical Exam  Constitutional: He is oriented to person, place, and time. He appears well-developed and well-nourished. No distress.  HENT:  Head: Normocephalic and atraumatic.  Mouth/Throat: Oropharynx is clear and moist.  Eyes: Conjunctivae are normal. Pupils are equal,  round, and reactive to light. No scleral icterus.  Neck: Neck supple.  Cardiovascular: Normal rate, regular rhythm, normal heart sounds and intact distal pulses.   No murmur heard. Pulmonary/Chest: Effort normal and breath sounds normal. No stridor. No respiratory distress. He has no wheezes. He has no rales.  Abdominal: Soft. He exhibits no distension. There is no tenderness.  Musculoskeletal: Normal range of motion. He exhibits no edema.  Neurological: He is alert and oriented to  person, place, and time. He has normal strength. No cranial nerve deficit or sensory deficit. Coordination and gait normal. GCS eye subscore is 4. GCS verbal subscore is 5. GCS motor subscore is 6.  Pt had a mild and incompletely reproducible visual field deficit in his right eye to lower medial visual field.   Skin: Skin is warm and dry. No rash noted.  Psychiatric: He has a normal mood and affect. His behavior is normal.  Nursing note and vitals reviewed.   ED Course  Procedures (including critical care time) Labs Review Labs Reviewed  CBC - Abnormal; Notable for the following:    HCT 38.6 (*)    All other components within normal limits  BASIC METABOLIC PANEL  I-STAT TROPOININ, ED    Imaging Review Ct Head Wo Contrast  07/11/2014   CLINICAL DATA:  Right-sided headache and vision alteration for the past few days. Diabetes and hypertension.  EXAM: CT HEAD WITHOUT CONTRAST  TECHNIQUE: Contiguous axial images were obtained from the base of the skull through the vertex without intravenous contrast.  COMPARISON:  07/01/2004  FINDINGS: The brainstem, cerebellum, cerebral peduncles, thalamus, basal ganglia, basilar cisterns, and ventricular system appear within normal limits. No intracranial hemorrhage, mass lesion, or acute CVA.  There is atherosclerotic calcification of the cavernous carotid arteries bilaterally.  IMPRESSION: 1.  No significant abnormality identified.   Electronically Signed   By: Van Clines M.D.   On: 07/11/2014 17:31  All radiology studies independently viewed by me.      EKG Interpretation   Date/Time:  Friday July 11 2014 16:08:42 EST Ventricular Rate:  79 PR Interval:  165 QRS Duration: 92 QT Interval:  373 QTC Calculation: 428 R Axis:   27 Text Interpretation:  Sinus rhythm Probable left atrial enlargement Left  ventricular hypertrophy Anterior ST elevation, probably due to LVH No old  tracing to compare Confirmed by [facility]  MD, TREY ([id number]) on  07/11/2014  4:14:03 PM      MDM   Final diagnoses:  Headache  Hyperglycemia   49 yo male with 3-4 days of intermittent severe right sided headaches.  He has had some visual changes which he describes as seeing flashes or seeing an image lag when he looks away from an object.  He describes his symptoms as intermittent and associated with a severe headache. The episodes of headache and visual disturbances usually last 2-5 minutes before easing up.    His workup in the ED was notable for severe hyperglycemia.  After fluids, his blood sugar had only marginally improved, so IV insulin was ordered.  After fluids, he felt like his blurry vision was somewhat better.    I also discussed his visual symptoms with Dr. Ellie Lunch (Ophthalmology).  She felt that due to time course of symptoms as well as atypical nature of presentation that he could follow up closely outpatient.    He will be admitted for further treatment of his diabetes.    Houston Siren III, MD 07/12/14 [phone]

## 2014-07-11 NOTE — ED Notes (Signed)
Pt presents with c/o chest pain that started approx 3-4 days ago in the left side of his chest. Pt also c/o blurred vision that started 3-4 days ago as well. Pt says he is seeing spots in front of his eyes.

## 2014-07-11 NOTE — ED Notes (Signed)
MD aware of hyperglycemia.

## 2014-07-12 ENCOUNTER — Inpatient Hospital Stay (HOSPITAL_COMMUNITY): Payer: Medicaid Other

## 2014-07-12 ENCOUNTER — Encounter (HOSPITAL_COMMUNITY): Payer: Self-pay | Admitting: *Deleted

## 2014-07-12 DIAGNOSIS — R270 Ataxia, unspecified: Secondary | ICD-10-CM

## 2014-07-12 DIAGNOSIS — I509 Heart failure, unspecified: Secondary | ICD-10-CM

## 2014-07-12 DIAGNOSIS — R51 Headache: Secondary | ICD-10-CM

## 2014-07-12 LAB — GLUCOSE, CAPILLARY
Glucose-Capillary: 125 mg/dL — ABNORMAL HIGH (ref 70–99)
Glucose-Capillary: 76 mg/dL (ref 70–99)
Glucose-Capillary: 189 mg/dL — ABNORMAL HIGH (ref 70–99)
Glucose-Capillary: 256 mg/dL — ABNORMAL HIGH (ref 70–99)
Glucose-Capillary: 215 mg/dL — ABNORMAL HIGH (ref 70–99)

## 2014-07-12 LAB — MAGNESIUM: Magnesium: 1.7 mg/dL (ref 1.5–2.5)

## 2014-07-12 LAB — CBC
HCT: 33.8 % — ABNORMAL LOW (ref 39.0–52.0)
HEMOGLOBIN: 11.5 g/dL — AB (ref 13.0–17.0)
MCH: 30.5 pg (ref 26.0–34.0)
MCHC: 34 g/dL (ref 30.0–36.0)
MCV: 89.7 fL (ref 78.0–100.0)
PLATELETS: 187 10*3/uL (ref 150–400)
RBC: 3.77 MIL/uL — ABNORMAL LOW (ref 4.22–5.81)
RDW: 12.5 % (ref 11.5–15.5)
WBC: 6.1 10*3/uL (ref 4.0–10.5)

## 2014-07-12 LAB — COMPREHENSIVE METABOLIC PANEL
ALT: 15 U/L (ref 0–53)
AST: 15 U/L (ref 0–37)
Albumin: 2.8 g/dL — ABNORMAL LOW (ref 3.5–5.2)
Alkaline Phosphatase: 185 U/L — ABNORMAL HIGH (ref 39–117)
Anion gap: 5 (ref 5–15)
BILIRUBIN TOTAL: 0.7 mg/dL (ref 0.3–1.2)
BUN: 16 mg/dL (ref 6–23)
CALCIUM: 7.9 mg/dL — AB (ref 8.4–10.5)
CO2: 22 mmol/L (ref 19–32)
CREATININE: 2.54 mg/dL — AB (ref 0.50–1.35)
Chloride: 108 mmol/L (ref 96–112)
GFR calc Af Amer: 32 mL/min — ABNORMAL LOW (ref >90)
GFR calc non Af Amer: 28 mL/min — ABNORMAL LOW (ref >90)
Glucose, Bld: 266 mg/dL — ABNORMAL HIGH (ref 70–99)
Potassium: 3.4 mmol/L — ABNORMAL LOW (ref 3.5–5.1)
Sodium: 135 mmol/L (ref 135–145)
TOTAL PROTEIN: 6.1 g/dL (ref 6.0–8.3)

## 2014-07-12 LAB — PHOSPHORUS: PHOSPHORUS: 3 mg/dL (ref 2.3–4.6)

## 2014-07-12 LAB — TSH: TSH: 1.016 u[IU]/mL (ref 0.350–4.500)

## 2014-07-12 MED ORDER — INSULIN GLARGINE 100 UNIT/ML ~~LOC~~ SOLN
20.0000 [IU] | Freq: Every day | SUBCUTANEOUS | Status: DC
  Administered 2014-07-12: 20 [IU] via SUBCUTANEOUS
  Filled 2014-07-12 (×2): qty 0.2

## 2014-07-12 MED ORDER — INSULIN ASPART 100 UNIT/ML ~~LOC~~ SOLN: 0.0000 [IU] | Freq: Every day | SUBCUTANEOUS | Status: DC

## 2014-07-12 MED ORDER — LORAZEPAM 2 MG/ML IJ SOLN
INTRAMUSCULAR | Status: AC
  Filled 2014-07-12: qty 1

## 2014-07-12 MED ORDER — INSULIN ASPART 100 UNIT/ML ~~LOC~~ SOLN
0.0000 [IU] | Freq: Three times a day (TID) | SUBCUTANEOUS | Status: DC
  Administered 2014-07-12: 7 [IU] via SUBCUTANEOUS
  Administered 2014-07-12: 11 [IU] via SUBCUTANEOUS

## 2014-07-12 MED ORDER — VITAMINS A & D EX OINT
TOPICAL_OINTMENT | CUTANEOUS | Status: AC
  Administered 2014-07-12: 14:00:00
  Filled 2014-07-12: qty 5

## 2014-07-12 MED ORDER — DEXTROSE 50 % IV SOLN
INTRAVENOUS | Status: AC
  Filled 2014-07-12: qty 50

## 2014-07-12 MED ORDER — LORAZEPAM 2 MG/ML IJ SOLN
1.0000 mg | Freq: Once | INTRAMUSCULAR | Status: AC
Start: 2014-07-12 — End: 2014-07-12
  Administered 2014-07-12: 1 mg via INTRAVENOUS

## 2014-07-12 MED ORDER — POTASSIUM CHLORIDE CRYS ER 20 MEQ PO TBCR
40.0000 meq | EXTENDED_RELEASE_TABLET | Freq: Once | ORAL | Status: AC
  Administered 2014-07-12: 40 meq via ORAL
  Filled 2014-07-12: qty 2

## 2014-07-12 NOTE — Progress Notes (Signed)
VASCULAR LAB PRELIMINARY  PRELIMINARY  PRELIMINARY  PRELIMINARY  Carotid Dopplers completed.    Preliminary report:  1-39% ICA stenosis.  Right vertebral not insonated secondary to movement.  Left Vertebral artery flow is antegrade.   Cody Webster, RVT 07/12/2014, 10:37 AM

## 2014-07-12 NOTE — Discharge Summary (Addendum)
Physician Discharge Summary  Cody Webster [id number] DOB: 1966/01/16 DOA: 07/11/2014  PCP: Kerin Perna, NP  Admit date: 07/11/2014 Discharge date: 07/12/2014  Time spent: 45 minutes  Recommendations for Outpatient Follow-up:  None; patient left Against medical advice  Discharge Diagnoses:  Frequent headaches, likely tension Essential Hypertension Hyperglycemia/Diabetes mellitus, Type 2 Chronic kidney disease, stage 3 Diabetic foot ulcer Chronic CHF  Discharge Condition: Not stable  Diet recommendation: None  Filed Weights   07/11/14 2213  Weight: 104.826 kg (231 lb 1.6 oz)    History of present illness:  By Dr. Toy Baker on 07/11/2014 Patient reports for the past 3-4 days he have been having generalized headache, blurred vision, he have been having some increased urination, some occasional frothy urine. He has been off his metformin for the past 1 year since his diabetes got better. Wife states he has had some fatigue. Patient reports seeing some bright lights, occasionally seeing patterns. Patient reports is for past 1 week he's been having episodes that start with ataxia then progressed to patient having trouble verbalizing his thoughts then he would get a headache or visual abnormalities after they changed things would resolve. She been having it frequently few times a day. He presented to ER was found to have blood glucose 616. ER has discussed care with ophthalmology who recommended out patient follow up. CT scan of the head showing no acute findings there is evidence of atherosclerotic calcification of current is carotid arteries bilaterally. Of note patient has been told in the past that he has heart failure is no echogram of the system and not sure if anybody ever obtain echogram he is not Lasix reports occasional leg swelling but not now.  He has long-standing history of chronic kidney disease supposed to be followed up with nephrology but states he was  never referred. Patient has chronic left foot ulcer was followed by Dr. Sharol Given have seen him just today and felt that his ulcer looks better. There is no fever or chills no leg swelling. Hospitalist was called for admission for symptomatic hyperglycemia  Hospital Course:  Frequent headaches, likely tension -CT head: No significant abnormality identified -MRI of brain: Normal MRI appearance of the brain for age -Carotid Doppler: 1-39% ICA stenosis, right vertebral not insonated secondary to movement, left vertebral artery is antegrade -Patient states his headache has actually improved since coming in. Of note his headache has been ongoing for approximately one week has been intermittent. -Patient did not want to wait for echocardiogram.  Essential Hypertension -Continue home regimen  Hyperglycemia/Diabetes mellitus, Type 2 -Patient supposedly required glucose stabilizer upon admission however this was discontinued quickly -Admitting sugar was over 600, point-of-care glucose still over 200 -Patient is not on any home medications for his diabetes -He has already told he refuses to take insulin -Patient leaving his medical advice -Hemoglobin A1c pending -Last hemoglobin A1c in July 2015 was 6.2 -Of note, patient has not seen an ophthalmologist. Patient was urged to see an ophthalmologist as soon as possible. He does complain of some visual changes in his right eye which have been chronic.  Chronic kidney disease, stage 3 -Creatinine stable, GFR appears to have baseline -Patient should follow-up with his primary care physician or see a nephrologist.  Diabetic foot ulcer -Continue to follow-up with Dr. Sharol Given  Chronic CHF -Unknown type of CHF, patient was supposedly diagnosed in [place], [place]. -Patient not on any diuretics  Code Status: DNR  Procedures: None  Consultations: None  Discharge Exam:  Filed Vitals:   07/12/14 1310  BP: 164/82  Pulse: 79  Temp: 98.2 F  (36.8 C)  Resp: 18     General: Well developed, NAD  HEENT: NCAT, mucous membranes moist.  Cardiovascular: S1 S2 auscultated, RRR  Respiratory: Clear to auscultation bilaterally with equal chest rise  Discharge Instructions     Medication List    ASK your doctor about these medications        amLODipine 10 MG tablet  Commonly known as:  NORVASC  Take 1 tablet (10 mg total) by mouth daily.     DSS 100 MG Caps  Take 100 mg by mouth 2 (two) times daily.     gabapentin 100 MG capsule  Commonly known as:  NEURONTIN  Take 100 mg by mouth 3 (three) times daily.     hydrALAZINE 25 MG tablet  Commonly known as:  APRESOLINE  Take 1 tablet (25 mg total) by mouth every 8 (eight) hours.     labetalol 300 MG tablet  Commonly known as:  NORMODYNE  Take 1 tablet (300 mg total) by mouth 2 (two) times daily.     nicotine 14 mg/24hr patch  Commonly known as:  NICODERM CQ - dosed in mg/24 hours  Place 1 patch (14 mg total) onto the skin daily.     oxyCODONE-acetaminophen 5-325 MG per tablet  Commonly known as:  ROXICET  Take 1-2 tablets by mouth every 4 (four) hours as needed for severe pain.       Allergies  Allergen Reactions  . Influenza Vaccines     Chest congestion, fever  . Pneumococcal Vaccines     Nausea, vomiting, fever  . Morphine And Related Other (See Comments)      The results of significant diagnostics from this hospitalization (including imaging, microbiology, ancillary and laboratory) are listed below for reference.    Significant Diagnostic Studies: Ct Head Wo Contrast  07/11/2014   CLINICAL DATA:  Right-sided headache and vision alteration for the past few days. Diabetes and hypertension.  EXAM: CT HEAD WITHOUT CONTRAST  TECHNIQUE: Contiguous axial images were obtained from the base of the skull through the vertex without intravenous contrast.  COMPARISON:  07/01/2004  FINDINGS: The brainstem, cerebellum, cerebral peduncles, thalamus, basal ganglia,  basilar cisterns, and ventricular system appear within normal limits. No intracranial hemorrhage, mass lesion, or acute CVA.  There is atherosclerotic calcification of the cavernous carotid arteries bilaterally.  IMPRESSION: 1.  No significant abnormality identified.   Electronically Signed   By: Van Clines M.D.   On: 07/11/2014 17:31   Mr Brain Wo Contrast  07/12/2014   CLINICAL DATA:  Ataxia.  Unsteady gait.  EXAM: MRI HEAD WITHOUT CONTRAST  TECHNIQUE: Multiplanar, multiecho pulse sequences of the brain and surrounding structures were obtained without intravenous contrast.  COMPARISON:  CT head without contrast 07/11/2014  FINDINGS: No acute infarct, hemorrhage, or mass lesion is present. The ventricles are of normal size. No significant extraaxial fluid collection is present.  Flow is present in the major intracranial arteries. Globes and orbits are within normal limits. Anterior globes are somewhat obscured by patient motion. The paranasal sinuses are clear. There is fluid in the mastoid air cells bilaterally, right greater than left. No obstructing nasopharyngeal lesion is evident.  Skullbase is otherwise unremarkable. Midline structures are within normal limits. The cell is within the optic chiasm is normal. Normal limits.  IMPRESSION: 1. Normal MRI appearance of the brain for age. 2. Bilateral mastoid effusions. No obstructing nasopharyngeal  lesion is evident   Electronically Signed   By: San Morelle M.D.   On: 07/12/2014 13:00    Microbiology: No results found for this or any previous visit (from the past 240 hour(s)).   Labs: Basic Metabolic Panel:  Recent Labs Lab 07/11/14 1612 07/12/14 0516  NA 129* 135  K 3.8 3.4*  CL 98 108  CO2 22 22  GLUCOSE 616* 266*  BUN 17 16  CREATININE 2.66* 2.54*  CALCIUM 8.4 7.9*  MG  --  1.7  PHOS  --  3.0   Liver Function Tests:  Recent Labs Lab 07/12/14 0516  AST 15  ALT 15  ALKPHOS 185*  BILITOT 0.7  PROT 6.1  ALBUMIN  2.8*   No results for input(s): LIPASE, AMYLASE in the last 168 hours. No results for input(s): AMMONIA in the last 168 hours. CBC:  Recent Labs Lab 07/11/14 1612 07/12/14 0516  WBC 6.3 6.1  HGB 13.4 11.5*  HCT 38.6* 33.8*  MCV 90.0 89.7  PLT 194 187   Cardiac Enzymes: No results for input(s): CKTOTAL, CKMB, CKMBINDEX, TROPONINI in the last 168 hours. BNP: BNP (last 3 results) No results for input(s): BNP in the last 8760 hours.  ProBNP (last 3 results) No results for input(s): PROBNP in the last 8760 hours.  CBG:  Recent Labs Lab 07/12/14 0006 07/12/14 0108 07/12/14 0213 07/12/14 0831 07/12/14 1315  GLUCAP 76 125* 189* 256* 215*       Signed:  Cristal Ford  [facility] 07/12/2014, 1:59 PM

## 2014-07-12 NOTE — Progress Notes (Signed)
  Echocardiogram 2D Echocardiogram has been performed.  Lysle Rubens 07/12/2014, 4:32 PM

## 2014-09-05 ENCOUNTER — Encounter (HOSPITAL_COMMUNITY): Payer: Self-pay | Admitting: Emergency Medicine

## 2014-09-05 ENCOUNTER — Inpatient Hospital Stay (HOSPITAL_COMMUNITY)
Admission: EM | Admit: 2014-09-05 | Discharge: 2014-09-09 | DRG: 683 | Disposition: A | Discharge status: Home / Self Care | Payer: Medicaid Other | Attending: Internal Medicine | Admitting: Internal Medicine

## 2014-09-05 DIAGNOSIS — N179 Acute kidney failure, unspecified: Principal | ICD-10-CM | POA: Diagnosis present

## 2014-09-05 DIAGNOSIS — L97509 Non-pressure chronic ulcer of other part of unspecified foot with unspecified severity: Secondary | ICD-10-CM

## 2014-09-05 DIAGNOSIS — Z794 Long term (current) use of insulin: Secondary | ICD-10-CM

## 2014-09-05 DIAGNOSIS — I10 Essential (primary) hypertension: Secondary | ICD-10-CM | POA: Diagnosis present

## 2014-09-05 DIAGNOSIS — I129 Hypertensive chronic kidney disease with stage 1 through stage 4 chronic kidney disease, or unspecified chronic kidney disease: Secondary | ICD-10-CM | POA: Diagnosis present

## 2014-09-05 DIAGNOSIS — Z8249 Family history of ischemic heart disease and other diseases of the circulatory system: Secondary | ICD-10-CM

## 2014-09-05 DIAGNOSIS — N19 Unspecified kidney failure: Secondary | ICD-10-CM

## 2014-09-05 DIAGNOSIS — N183 Chronic kidney disease, stage 3 (moderate): Secondary | ICD-10-CM | POA: Diagnosis present

## 2014-09-05 DIAGNOSIS — E10621 Type 1 diabetes mellitus with foot ulcer: Secondary | ICD-10-CM | POA: Diagnosis present

## 2014-09-05 DIAGNOSIS — F17200 Nicotine dependence, unspecified, uncomplicated: Secondary | ICD-10-CM | POA: Diagnosis present

## 2014-09-05 DIAGNOSIS — E162 Hypoglycemia, unspecified: Secondary | ICD-10-CM | POA: Diagnosis present

## 2014-09-05 DIAGNOSIS — E11621 Type 2 diabetes mellitus with foot ulcer: Secondary | ICD-10-CM | POA: Diagnosis present

## 2014-09-05 DIAGNOSIS — E10649 Type 1 diabetes mellitus with hypoglycemia without coma: Secondary | ICD-10-CM | POA: Diagnosis present

## 2014-09-05 DIAGNOSIS — N189 Chronic kidney disease, unspecified: Secondary | ICD-10-CM

## 2014-09-05 DIAGNOSIS — F17210 Nicotine dependence, cigarettes, uncomplicated: Secondary | ICD-10-CM | POA: Diagnosis present

## 2014-09-05 DIAGNOSIS — L97529 Non-pressure chronic ulcer of other part of left foot with unspecified severity: Secondary | ICD-10-CM | POA: Diagnosis present

## 2014-09-05 DIAGNOSIS — N184 Chronic kidney disease, stage 4 (severe): Secondary | ICD-10-CM | POA: Diagnosis present

## 2014-09-05 DIAGNOSIS — Z833 Family history of diabetes mellitus: Secondary | ICD-10-CM

## 2014-09-05 DIAGNOSIS — E875 Hyperkalemia: Secondary | ICD-10-CM | POA: Diagnosis present

## 2014-09-05 DIAGNOSIS — Z79899 Other long term (current) drug therapy: Secondary | ICD-10-CM

## 2014-09-05 DIAGNOSIS — D649 Anemia, unspecified: Secondary | ICD-10-CM | POA: Diagnosis present

## 2014-09-05 DIAGNOSIS — E1065 Type 1 diabetes mellitus with hyperglycemia: Secondary | ICD-10-CM | POA: Diagnosis present

## 2014-09-05 DIAGNOSIS — H4089 Other specified glaucoma: Secondary | ICD-10-CM | POA: Diagnosis present

## 2014-09-05 DIAGNOSIS — H409 Unspecified glaucoma: Secondary | ICD-10-CM | POA: Diagnosis present

## 2014-09-05 DIAGNOSIS — I5032 Chronic diastolic (congestive) heart failure: Secondary | ICD-10-CM | POA: Diagnosis present

## 2014-09-05 DIAGNOSIS — N185 Chronic kidney disease, stage 5: Secondary | ICD-10-CM | POA: Diagnosis present

## 2014-09-05 LAB — I-STAT CG4 LACTIC ACID, ED: LACTIC ACID, VENOUS: 0.55 mmol/L (ref 0.5–2.0)

## 2014-09-05 LAB — CBG MONITORING, ED
Glucose-Capillary: 20 mg/dL — CL (ref 70–99)
Glucose-Capillary: 87 mg/dL (ref 70–99)

## 2014-09-05 MED ORDER — DEXTROSE 50 % IV SOLN
1.0000 | Freq: Once | INTRAVENOUS | Status: AC
  Administered 2014-09-05: 50 mL via INTRAVENOUS

## 2014-09-05 MED ORDER — GLUCOSE 40 % PO GEL: 1.0000 | Freq: Once | ORAL | Status: DC

## 2014-09-05 MED ORDER — GLUCAGON HCL RDNA (DIAGNOSTIC) 1 MG IJ SOLR: 1.0000 mg | Freq: Once | INTRAMUSCULAR | Status: DC

## 2014-09-05 MED ORDER — DEXTROSE 50 % IV SOLN
INTRAVENOUS | Status: AC
  Filled 2014-09-05: qty 50

## 2014-09-05 MED ORDER — TETRACAINE HCL 0.5 % OP SOLN
1.0000 [drp] | Freq: Once | OPHTHALMIC | Status: AC
  Administered 2014-09-06: 1 [drp] via OPHTHALMIC
  Filled 2014-09-05: qty 2

## 2014-09-05 MED ORDER — FLUORESCEIN SODIUM 1 MG OP STRP
1.0000 | ORAL_STRIP | Freq: Once | OPHTHALMIC | Status: AC
  Administered 2014-09-06: 1 via OPHTHALMIC
  Filled 2014-09-05: qty 1

## 2014-09-05 NOTE — ED Notes (Signed)
Pt states he had a red painful R eye for about 2 weeks. States eye has become more painful now and he is unable to see anything out of R eye. Pt states he has "milky vision" out of his L eye and vision is getting worse in that eye. Pt has hx of Type I DM. Pt also states he has pain to the R side of his face and R sided headache. Pt denies nausea.

## 2014-09-06 ENCOUNTER — Encounter (HOSPITAL_COMMUNITY): Payer: Self-pay | Admitting: *Deleted

## 2014-09-06 DIAGNOSIS — I10 Essential (primary) hypertension: Secondary | ICD-10-CM | POA: Diagnosis not present

## 2014-09-06 DIAGNOSIS — E162 Hypoglycemia, unspecified: Secondary | ICD-10-CM | POA: Diagnosis not present

## 2014-09-06 DIAGNOSIS — L97529 Non-pressure chronic ulcer of other part of left foot with unspecified severity: Secondary | ICD-10-CM

## 2014-09-06 DIAGNOSIS — Z79899 Other long term (current) drug therapy: Secondary | ICD-10-CM | POA: Diagnosis not present

## 2014-09-06 DIAGNOSIS — Z794 Long term (current) use of insulin: Secondary | ICD-10-CM | POA: Diagnosis not present

## 2014-09-06 DIAGNOSIS — N183 Chronic kidney disease, stage 3 (moderate): Secondary | ICD-10-CM

## 2014-09-06 DIAGNOSIS — E10621 Type 1 diabetes mellitus with foot ulcer: Secondary | ICD-10-CM | POA: Diagnosis present

## 2014-09-06 DIAGNOSIS — N179 Acute kidney failure, unspecified: Secondary | ICD-10-CM | POA: Diagnosis present

## 2014-09-06 DIAGNOSIS — Z8249 Family history of ischemic heart disease and other diseases of the circulatory system: Secondary | ICD-10-CM | POA: Diagnosis not present

## 2014-09-06 DIAGNOSIS — I5032 Chronic diastolic (congestive) heart failure: Secondary | ICD-10-CM | POA: Diagnosis present

## 2014-09-06 DIAGNOSIS — N189 Chronic kidney disease, unspecified: Secondary | ICD-10-CM

## 2014-09-06 DIAGNOSIS — E1069 Type 1 diabetes mellitus with other specified complication: Secondary | ICD-10-CM

## 2014-09-06 DIAGNOSIS — F17200 Nicotine dependence, unspecified, uncomplicated: Secondary | ICD-10-CM | POA: Diagnosis present

## 2014-09-06 DIAGNOSIS — F17210 Nicotine dependence, cigarettes, uncomplicated: Secondary | ICD-10-CM | POA: Diagnosis present

## 2014-09-06 DIAGNOSIS — D649 Anemia, unspecified: Secondary | ICD-10-CM | POA: Diagnosis present

## 2014-09-06 DIAGNOSIS — E1065 Type 1 diabetes mellitus with hyperglycemia: Secondary | ICD-10-CM | POA: Diagnosis present

## 2014-09-06 DIAGNOSIS — Z720 Tobacco use: Secondary | ICD-10-CM

## 2014-09-06 DIAGNOSIS — H40211 Acute angle-closure glaucoma, right eye: Secondary | ICD-10-CM | POA: Diagnosis not present

## 2014-09-06 DIAGNOSIS — H4089 Other specified glaucoma: Secondary | ICD-10-CM | POA: Diagnosis present

## 2014-09-06 DIAGNOSIS — E875 Hyperkalemia: Secondary | ICD-10-CM | POA: Diagnosis present

## 2014-09-06 DIAGNOSIS — Z833 Family history of diabetes mellitus: Secondary | ICD-10-CM | POA: Diagnosis not present

## 2014-09-06 DIAGNOSIS — H409 Unspecified glaucoma: Secondary | ICD-10-CM | POA: Diagnosis present

## 2014-09-06 DIAGNOSIS — E10649 Type 1 diabetes mellitus with hypoglycemia without coma: Secondary | ICD-10-CM | POA: Diagnosis present

## 2014-09-06 DIAGNOSIS — I129 Hypertensive chronic kidney disease with stage 1 through stage 4 chronic kidney disease, or unspecified chronic kidney disease: Secondary | ICD-10-CM | POA: Diagnosis present

## 2014-09-06 LAB — BASIC METABOLIC PANEL
ANION GAP: 7 (ref 5–15)
Anion gap: 7 (ref 5–15)
BUN: 39 mg/dL — AB (ref 6–23)
BUN: 40 mg/dL — ABNORMAL HIGH (ref 6–23)
CO2: 23 mmol/L (ref 19–32)
CO2: 22 mmol/L (ref 19–32)
CREATININE: 3.19 mg/dL — AB (ref 0.50–1.35)
CREATININE: 3.47 mg/dL — AB (ref 0.50–1.35)
Calcium: 8.6 mg/dL (ref 8.4–10.5)
Calcium: 8.9 mg/dL (ref 8.4–10.5)
Chloride: 108 mmol/L (ref 96–112)
Chloride: 110 mmol/L (ref 96–112)
GFR calc Af Amer: 22 mL/min — ABNORMAL LOW (ref >90)
GFR calc Af Amer: 25 mL/min — ABNORMAL LOW (ref >90)
GFR calc non Af Amer: 19 mL/min — ABNORMAL LOW (ref >90)
GFR, EST NON AFRICAN AMERICAN: 21 mL/min — AB (ref >90)
Glucose, Bld: 143 mg/dL — ABNORMAL HIGH (ref 70–99)
Glucose, Bld: 23 mg/dL — CL (ref 70–99)
Potassium: 4.1 mmol/L (ref 3.5–5.1)
Potassium: 5.3 mmol/L — ABNORMAL HIGH (ref 3.5–5.1)
SODIUM: 138 mmol/L (ref 135–145)
Sodium: 139 mmol/L (ref 135–145)

## 2014-09-06 LAB — GLUCOSE, CAPILLARY
GLUCOSE-CAPILLARY: 140 mg/dL — AB (ref 70–99)
GLUCOSE-CAPILLARY: 118 mg/dL — AB (ref 70–99)
GLUCOSE-CAPILLARY: 120 mg/dL — AB (ref 70–99)
Glucose-Capillary: 131 mg/dL — ABNORMAL HIGH (ref 70–99)
Glucose-Capillary: 133 mg/dL — ABNORMAL HIGH (ref 70–99)
Glucose-Capillary: 135 mg/dL — ABNORMAL HIGH (ref 70–99)
Glucose-Capillary: 147 mg/dL — ABNORMAL HIGH (ref 70–99)
Glucose-Capillary: 151 mg/dL — ABNORMAL HIGH (ref 70–99)
Glucose-Capillary: 173 mg/dL — ABNORMAL HIGH (ref 70–99)

## 2014-09-06 LAB — CBC WITH DIFFERENTIAL/PLATELET
BASOS PCT: 0 % (ref 0–1)
Basophils Absolute: 0 10*3/uL (ref 0.0–0.1)
Eosinophils Absolute: 0.2 10*3/uL (ref 0.0–0.7)
Eosinophils Relative: 3 % (ref 0–5)
HEMATOCRIT: 37.5 % — AB (ref 39.0–52.0)
HEMOGLOBIN: 12.5 g/dL — AB (ref 13.0–17.0)
Lymphocytes Relative: 26 % (ref 12–46)
Lymphs Abs: 2.2 10*3/uL (ref 0.7–4.0)
MCH: 30.9 pg (ref 26.0–34.0)
MCHC: 33.3 g/dL (ref 30.0–36.0)
MCV: 92.6 fL (ref 78.0–100.0)
MONO ABS: 1.2 10*3/uL — AB (ref 0.1–1.0)
Monocytes Relative: 14 % — ABNORMAL HIGH (ref 3–12)
Neutro Abs: 4.9 10*3/uL (ref 1.7–7.7)
Neutrophils Relative %: 57 % (ref 43–77)
Platelets: 238 10*3/uL (ref 150–400)
RBC: 4.05 MIL/uL — ABNORMAL LOW (ref 4.22–5.81)
RDW: 13 % (ref 11.5–15.5)
WBC: 8.5 10*3/uL (ref 4.0–10.5)

## 2014-09-06 LAB — HEPATIC FUNCTION PANEL
ALBUMIN: 3.8 g/dL (ref 3.5–5.2)
ALT: 23 U/L (ref 0–53)
AST: 20 U/L (ref 0–37)
Alkaline Phosphatase: 208 U/L — ABNORMAL HIGH (ref 39–117)
Bilirubin, Direct: 0.1 mg/dL (ref 0.0–0.5)
Indirect Bilirubin: 0.2 mg/dL — ABNORMAL LOW (ref 0.3–0.9)
TOTAL PROTEIN: 8.1 g/dL (ref 6.0–8.3)
Total Bilirubin: 0.3 mg/dL (ref 0.3–1.2)

## 2014-09-06 LAB — URINALYSIS, ROUTINE W REFLEX MICROSCOPIC
BILIRUBIN URINE: NEGATIVE
Glucose, UA: NEGATIVE mg/dL
Ketones, ur: NEGATIVE mg/dL
Leukocytes, UA: NEGATIVE
NITRITE: NEGATIVE
Protein, ur: >300 mg/dL — AB
SPECIFIC GRAVITY, URINE: 1.01 (ref 1.005–1.030)
UROBILINOGEN UA: 0.2 mg/dL (ref 0.0–1.0)
pH: 5.5 (ref 5.0–8.0)

## 2014-09-06 LAB — CBC
HCT: 37.1 % — ABNORMAL LOW (ref 39.0–52.0)
HEMOGLOBIN: 12.2 g/dL — AB (ref 13.0–17.0)
MCH: 30.7 pg (ref 26.0–34.0)
MCHC: 32.9 g/dL (ref 30.0–36.0)
MCV: 93.5 fL (ref 78.0–100.0)
Platelets: 216 10*3/uL (ref 150–400)
RBC: 3.97 MIL/uL — ABNORMAL LOW (ref 4.22–5.81)
RDW: 12.9 % (ref 11.5–15.5)
WBC: 7.3 10*3/uL (ref 4.0–10.5)

## 2014-09-06 LAB — IRON AND TIBC
Iron: 44 ug/dL (ref 42–165)
Saturation Ratios: 15 % — ABNORMAL LOW (ref 20–55)
TIBC: 288 ug/dL (ref 215–435)
UIBC: 244 ug/dL (ref 125–400)

## 2014-09-06 LAB — URINE MICROSCOPIC-ADD ON

## 2014-09-06 LAB — FOLATE: Folate: 11.4 ng/mL

## 2014-09-06 LAB — FERRITIN: Ferritin: 207 ng/mL (ref 22–322)

## 2014-09-06 LAB — RETICULOCYTES
RBC.: 3.94 MIL/uL — ABNORMAL LOW (ref 4.22–5.81)
RETIC COUNT ABSOLUTE: 51.2 10*3/uL (ref 19.0–186.0)
Retic Ct Pct: 1.3 % (ref 0.4–3.1)

## 2014-09-06 LAB — VITAMIN B12: VITAMIN B 12: 523 pg/mL (ref 211–911)

## 2014-09-06 LAB — CBG MONITORING, ED: GLUCOSE-CAPILLARY: 41 mg/dL — AB (ref 70–99)

## 2014-09-06 MED ORDER — INSULIN ASPART 100 UNIT/ML ~~LOC~~ SOLN
0.0000 [IU] | SUBCUTANEOUS | Status: DC
  Administered 2014-09-06: 1 [IU] via SUBCUTANEOUS
  Administered 2014-09-06: 2 [IU] via SUBCUTANEOUS
  Administered 2014-09-06 – 2014-09-09 (×8): 1 [IU] via SUBCUTANEOUS

## 2014-09-06 MED ORDER — HYDROMORPHONE HCL 1 MG/ML IJ SOLN
0.5000 mg | INTRAMUSCULAR | Status: DC | PRN
  Administered 2014-09-06 – 2014-09-09 (×10): 1 mg via INTRAVENOUS
  Filled 2014-09-06 (×10): qty 1

## 2014-09-06 MED ORDER — INSULIN ASPART PROT & ASPART (70-30 MIX) 100 UNIT/ML ~~LOC~~ SUSP
10.0000 [IU] | Freq: Two times a day (BID) | SUBCUTANEOUS | Status: DC
  Administered 2014-09-06 – 2014-09-09 (×7): 10 [IU] via SUBCUTANEOUS
  Filled 2014-09-06: qty 10

## 2014-09-06 MED ORDER — NICOTINE 14 MG/24HR TD PT24
14.0000 mg | MEDICATED_PATCH | Freq: Every day | TRANSDERMAL | Status: DC
  Administered 2014-09-06 – 2014-09-09 (×4): 14 mg via TRANSDERMAL
  Filled 2014-09-06 (×4): qty 1

## 2014-09-06 MED ORDER — HYDRALAZINE HCL 25 MG PO TABS
25.0000 mg | ORAL_TABLET | Freq: Three times a day (TID) | ORAL | Status: DC
  Administered 2014-09-06 – 2014-09-07 (×6): 25 mg via ORAL
  Filled 2014-09-06 (×10): qty 1

## 2014-09-06 MED ORDER — ACETAMINOPHEN 650 MG RE SUPP: 650.0000 mg | Freq: Four times a day (QID) | RECTAL | Status: DC | PRN

## 2014-09-06 MED ORDER — LATANOPROST 0.005 % OP SOLN
1.0000 [drp] | OPHTHALMIC | Status: AC
  Administered 2014-09-06: 1 [drp] via OPHTHALMIC
  Filled 2014-09-06: qty 2.5

## 2014-09-06 MED ORDER — LORATADINE 10 MG PO TABS
10.0000 mg | ORAL_TABLET | Freq: Every day | ORAL | Status: DC
  Administered 2014-09-06 – 2014-09-09 (×4): 10 mg via ORAL
  Filled 2014-09-06 (×4): qty 1

## 2014-09-06 MED ORDER — LATANOPROST 0.005 % OP SOLN
1.0000 [drp] | Freq: Every day | OPHTHALMIC | Status: DC
  Administered 2014-09-06 – 2014-09-08 (×3): 1 [drp] via OPHTHALMIC
  Filled 2014-09-06: qty 2.5

## 2014-09-06 MED ORDER — DOCUSATE SODIUM 100 MG PO CAPS
100.0000 mg | ORAL_CAPSULE | Freq: Two times a day (BID) | ORAL | Status: DC
  Administered 2014-09-06 – 2014-09-09 (×7): 100 mg via ORAL
  Filled 2014-09-06 (×10): qty 1

## 2014-09-06 MED ORDER — BRIMONIDINE TARTRATE 0.2 % OP SOLN
1.0000 [drp] | Freq: Once | OPHTHALMIC | Status: AC
  Administered 2014-09-06: 1 [drp] via OPHTHALMIC
  Filled 2014-09-06: qty 5

## 2014-09-06 MED ORDER — BRIMONIDINE TARTRATE 0.15 % OP SOLN
1.0000 [drp] | Freq: Three times a day (TID) | OPHTHALMIC | Status: DC
  Administered 2014-09-06 – 2014-09-09 (×10): 1 [drp] via OPHTHALMIC
  Filled 2014-09-06: qty 5

## 2014-09-06 MED ORDER — GABAPENTIN 300 MG PO CAPS
300.0000 mg | ORAL_CAPSULE | Freq: Three times a day (TID) | ORAL | Status: DC
  Administered 2014-09-06 – 2014-09-09 (×10): 300 mg via ORAL
  Filled 2014-09-06 (×12): qty 1

## 2014-09-06 MED ORDER — INSULIN ASPART PROT & ASPART (70-30 MIX) 100 UNIT/ML ~~LOC~~ SUSP
15.0000 [IU] | Freq: Two times a day (BID) | SUBCUTANEOUS | Status: DC
  Filled 2014-09-06: qty 10

## 2014-09-06 MED ORDER — DEXTROSE 5 % IV SOLN
Freq: Once | INTRAVENOUS | Status: AC
  Administered 2014-09-06: 01:00:00 via INTRAVENOUS

## 2014-09-06 MED ORDER — SODIUM POLYSTYRENE SULFONATE 15 GM/60ML PO SUSP
15.0000 g | Freq: Once | ORAL | Status: AC
  Administered 2014-09-06: 15 g via ORAL
  Filled 2014-09-06 (×2): qty 60

## 2014-09-06 MED ORDER — SODIUM CHLORIDE 0.9 % IJ SOLN
3.0000 mL | Freq: Two times a day (BID) | INTRAMUSCULAR | Status: DC
  Administered 2014-09-06 (×2): 3 mL via INTRAVENOUS

## 2014-09-06 MED ORDER — ONDANSETRON HCL 4 MG/2ML IJ SOLN
4.0000 mg | Freq: Four times a day (QID) | INTRAMUSCULAR | Status: DC | PRN
  Administered 2014-09-08: 4 mg via INTRAVENOUS
  Filled 2014-09-06: qty 2

## 2014-09-06 MED ORDER — DORZOLAMIDE HCL 2 % OP SOLN: 1.0000 [drp] | Freq: Three times a day (TID) | OPHTHALMIC | Status: DC

## 2014-09-06 MED ORDER — DORZOLAMIDE HCL 2 % OP SOLN
1.0000 [drp] | Freq: Three times a day (TID) | OPHTHALMIC | Status: DC
  Filled 2014-09-06: qty 10

## 2014-09-06 MED ORDER — OXYCODONE HCL 5 MG PO TABS
5.0000 mg | ORAL_TABLET | ORAL | Status: DC | PRN
  Administered 2014-09-06 – 2014-09-08 (×8): 5 mg via ORAL
  Filled 2014-09-06 (×8): qty 1

## 2014-09-06 MED ORDER — TIMOLOL MALEATE 0.5 % OP SOLN
1.0000 [drp] | OPHTHALMIC | Status: AC
  Administered 2014-09-06 (×2): 1 [drp] via OPHTHALMIC
  Filled 2014-09-06: qty 5

## 2014-09-06 MED ORDER — DORZOLAMIDE HCL 2 % OP SOLN
1.0000 [drp] | Freq: Three times a day (TID) | OPHTHALMIC | Status: DC
  Administered 2014-09-06: 1 [drp] via OPHTHALMIC

## 2014-09-06 MED ORDER — ONDANSETRON HCL 4 MG PO TABS
4.0000 mg | ORAL_TABLET | Freq: Four times a day (QID) | ORAL | Status: DC | PRN
  Administered 2014-09-07: 4 mg via ORAL
  Filled 2014-09-06: qty 1

## 2014-09-06 MED ORDER — AMLODIPINE BESYLATE 10 MG PO TABS
10.0000 mg | ORAL_TABLET | Freq: Every day | ORAL | Status: DC
  Administered 2014-09-06 – 2014-09-09 (×4): 10 mg via ORAL
  Filled 2014-09-06 (×4): qty 1

## 2014-09-06 MED ORDER — HYDROCHLOROTHIAZIDE 25 MG PO TABS
25.0000 mg | ORAL_TABLET | Freq: Every day | ORAL | Status: DC
  Filled 2014-09-06: qty 1

## 2014-09-06 MED ORDER — SODIUM CHLORIDE 0.9 % IV SOLN: 250.0000 mL | INTRAVENOUS | Status: DC | PRN

## 2014-09-06 MED ORDER — ACETAMINOPHEN 325 MG PO TABS
650.0000 mg | ORAL_TABLET | Freq: Four times a day (QID) | ORAL | Status: DC | PRN
  Administered 2014-09-07 (×2): 650 mg via ORAL
  Filled 2014-09-06 (×2): qty 2

## 2014-09-06 MED ORDER — LATANOPROST 0.005 % OP SOLN: 1.0000 [drp] | Freq: Every day | OPHTHALMIC | Status: DC

## 2014-09-06 MED ORDER — DEXTROSE 50 % IV SOLN
INTRAVENOUS | Status: AC
  Filled 2014-09-06: qty 50

## 2014-09-06 MED ORDER — SODIUM CHLORIDE 0.9 % IJ SOLN: 3.0000 mL | INTRAMUSCULAR | Status: DC | PRN

## 2014-09-06 MED ORDER — ALUM & MAG HYDROXIDE-SIMETH 200-200-20 MG/5ML PO SUSP
30.0000 mL | Freq: Four times a day (QID) | ORAL | Status: DC | PRN
  Administered 2014-09-08: 30 mL via ORAL
  Filled 2014-09-06: qty 30

## 2014-09-06 MED ORDER — BRIMONIDINE TARTRATE 0.2 % OP SOLN: 1.0000 [drp] | Freq: Three times a day (TID) | OPHTHALMIC | Status: DC

## 2014-09-06 MED ORDER — DORZOLAMIDE HCL-TIMOLOL MAL 2-0.5 % OP SOLN
1.0000 [drp] | Freq: Two times a day (BID) | OPHTHALMIC | Status: DC
  Administered 2014-09-06 – 2014-09-09 (×7): 1 [drp] via OPHTHALMIC
  Filled 2014-09-06: qty 10

## 2014-09-06 MED ORDER — DORZOLAMIDE HCL-TIMOLOL MAL 2-0.5 % OP SOLN
1.0000 [drp] | Freq: Once | OPHTHALMIC | Status: AC
  Administered 2014-09-06: 1 [drp] via OPHTHALMIC
  Filled 2014-09-06: qty 10

## 2014-09-06 MED ORDER — DEXTROSE 50 % IV SOLN
1.0000 | Freq: Once | INTRAVENOUS | Status: AC
Start: 2014-09-06 — End: 2014-09-06
  Administered 2014-09-06: 50 mL via INTRAVENOUS

## 2014-09-06 MED ORDER — BRIMONIDINE TARTRATE 0.2 % OP SOLN
1.0000 [drp] | Freq: Once | OPHTHALMIC | Status: AC
  Administered 2014-09-06: 1 [drp] via OPHTHALMIC

## 2014-09-06 NOTE — Consult Note (Addendum)
OPHTHALMOLOGY CONSULT NOTE  Date: 09/06/14 Time: 8:18 AM  Patient Name: Cody Webster  DOB: Jun 07, 1965 MRN: [id number]  Reason for Consult: Elevated eye pressure  HPI:  This is a 49 y.o. African American male who presented to [facility] with recent dx of conjuctivitis due to right eye pain and redness. Pt states that his vision has gradually gotten worse over the last several weeks and he has recently noted increased redness and eye pain. Pt was seen in the ED where tonopen measured 51. I recommended that cosopt, brimonidine, and latanoprost be administered at around 1:30 am then again at 3:30 with a plan to re-evaluate the iop in the AM. Pt states that he continues to have eye pain this am.   Prior to Admission medications   Medication Sig Start Date End Date Taking? Authorizing Provider  amLODipine (NORVASC) 10 MG tablet Take 1 tablet (10 mg total) by mouth daily. 12/13/13  Yes Eugenie Filler, MD  carvedilol (COREG) 25 MG tablet Take 25 mg by mouth daily. 08/12/14  Yes Historical Provider, MD  cetirizine (ZYRTEC) 10 MG tablet Take 10 mg by mouth daily. 08/19/14  Yes Historical Provider, MD  fluticasone (FLONASE) 50 MCG/ACT nasal spray Place 1 spray into both nostrils daily. 08/19/14  Yes Historical Provider, MD  gabapentin (NEURONTIN) 300 MG capsule Take 300 mg by mouth 3 (three) times daily. 08/22/14  Yes Historical Provider, MD  hydrochlorothiazide (HYDRODIURIL) 25 MG tablet Take 25 mg by mouth daily. 08/12/14  Yes Historical Provider, MD  labetalol (NORMODYNE) 300 MG tablet Take 1 tablet (300 mg total) by mouth 2 (two) times daily. 12/13/13  Yes Eugenie Filler, MD  losartan (COZAAR) 50 MG tablet Take 50 mg by mouth daily. 08/12/14  Yes Historical Provider, MD  nicotine (NICODERM CQ - DOSED IN MG/24 HOURS) 21 mg/24hr patch Place 21 mg onto the skin daily. 07/16/14  Yes Historical Provider, MD  NOVOLOG MIX 70/30 FLEXPEN (70-30) 100 UNIT/ML FlexPen Inject 14-36 Units as directed 2 (two) times daily. 36 units  in AM; 14 units in PM. 08/22/14  Yes Historical Provider, MD  oxyCODONE-acetaminophen (ROXICET) 5-325 MG per tablet Take 1-2 tablets by mouth every 4 (four) hours as needed for severe pain. 12/13/13  Yes Eugenie Filler, MD  trimethoprim-polymyxin b (POLYTRIM) ophthalmic solution Place 1 drop into the left eye every 6 (six) hours. For 7 days. 09/01/14  Yes Historical Provider, MD  docusate sodium 100 MG CAPS Take 100 mg by mouth 2 (two) times daily. Patient not taking: Reported on 07/11/2014 12/13/13   Eugenie Filler, MD  gabapentin (NEURONTIN) 100 MG capsule Take 100 mg by mouth 3 (three) times daily.    Historical Provider, MD  hydrALAZINE (APRESOLINE) 25 MG tablet Take 1 tablet (25 mg total) by mouth every 8 (eight) hours. Patient not taking: Reported on 07/11/2014 12/13/13   Eugenie Filler, MD  nicotine (NICODERM CQ - DOSED IN MG/24 HOURS) 14 mg/24hr patch Place 1 patch (14 mg total) onto the skin daily. Patient not taking: Reported on 07/11/2014 12/13/13   Eugenie Filler, MD    Past Medical History  Diagnosis Date  . Hypertension   . Diabetes mellitus without complication   . CHF (congestive heart failure)     family history includes CAD in his mother and sister; Diabetes type II in his brother and father; Hypertension in his sister.  Social History   Occupational History  . Not on file.   Social History Main Topics  .  Smoking status: Current Every Day Smoker -- 0.50 packs/day for 30 years    Types: Cigarettes, Cigars  . Smokeless tobacco: Former Systems developer    Types: Snuff, Chew    Quit date: 12/11/1983  . Alcohol Use: Yes     Comment: ocassaionlly   . Drug Use: No  . Sexual Activity: Not on file    Allergies  Allergen Reactions  . Influenza Vaccines     Chest congestion, fever  . Pneumococcal Vaccines     Nausea, vomiting, fever  . Morphine And Related Other (See Comments)    ROS: Positive as above, otherwise negative.  EXAM:  Mental Status: A&O x 3   Base Exam:  Right Eye Left Eye  Visual Acuity (At near) HM at 2 ft 20/50  IOP (applanation) 40 18  Pupillary Exam Non reactive Round and reactive  Motility Full  Full   Confrontation VF Unable Full    Anterior Segment Exam    Lids/Lashes WNL WNL  Conjuctiva 3+ injection White and Quiet  Cornea 3+ microcystic edema Clear  Anterior Chamber Deep  Deep and Quiet  Iris Round, non reactive, [place] NV Round, Reactive  Lens NSC NSC  Vitreous WNL WNL   Poster Segment Exam Good RR bare view of nerve, no detail due to edema.    Disc  Sharp  CD ratio  0.4  Macula  Flat, scattered DBH/MA, mild traction along superior arcade, difficult exam, no obvious NV  Vessels  Attenuated  Periphery  Attached   Radiographic Studies Reviewed: None No results found.  Assessment and Recommendation: 1. Severe neovascular glaucoma right eye:  -Suspect CRVO od but no good view given corneal edema.   -Continue Cosopt BID, Brimonidine TID, Latanoprost QHS  -Unable to do oral medications (diamox) due to recent renal insufficiency for which patient is hospitalized.   - No further intervention indicated at this time.  -Plan to arrange follow up for Monday or Tuesday.  -Poor visual prognosis discussed with patient.   Please call with any questions.  Jola Schmidt MD [facility] Ophthalmology [phone]

## 2014-09-06 NOTE — H&P (Signed)
[facility] Admission History and Physical       Cody Webster [id number] DOB: [birth year] DOA: 09/05/2014  Referring physician:  PCP: Kerin Perna, NP  Specialists:   Chief Complaint: Right Eye Pain and Redness and decreased Vision  HPI: Cody Webster is a 49 y.o. male with a history of Uncontrolled IDDM, HTN, CHF with an EF =50% (on  2D ECHO  06/2014), and CKD Stage III who presents to the ED with complaints of worsening of Right eye Pain and decreased vision over the past 3 days.  He reports that he has had redness of both eyes for 2-3 weeks, and he thought he had pink eye.  He was prescribed an antibiotiic Eye drop on 04/18, but his right eye continued to have pain and redness, and then he began to have decreased vision in the right eye.   In the ED, he was evaluated by the EDP Dr. Sharol Given, who consulted Dr. and Opthalmology was consulted and recommended initial treatment for acute Glaucoma, and will see the patient in the AM.   He was also found to have hypoglycemia with glucose levels to 23, and he was treated with IV Dextrose.   He also has worsening of his renal function, and Nephrology on call was called and advised discontinuation of his ARB Rx for now.   He was referred for medial admission.       Review of Systems:  Constitutional: No Weight Loss, No Weight Gain, Night Sweats, Fevers, Chills, Dizziness, Light Headedness, Fatigue, or Generalized Weakness HEENT: +Right Eye Pain and Lacrimation and  Drainage, No Headaches, Difficulty Swallowing,Tooth/Dental Problems,Sore Throat,  No Sneezing, Rhinitis, Ear Ache, Nasal Congestion, or Post Nasal Drip,  Cardio-vascular:  No Chest pain, Orthopnea, PND, Edema in Lower Extremities, Anasarca, Dizziness, Palpitations  Resp: No Dyspnea, No DOE, No Productive Cough, No Non-Productive Cough, No Hemoptysis, No Wheezing.    GI: No Heartburn, Indigestion, Abdominal Pain, Nausea, Vomiting, Diarrhea, Constipation, Hematemesis,  Hematochezia, Melena, Change in Bowel Habits,  Loss of Appetite  GU: No Dysuria, No Change in Color of Urine, No Urgency or Urinary Frequency, No Flank pain.  Musculoskeletal: No Joint Pain or Swelling, No Decreased Range of Motion, No Back Pain.  Neurologic: No Syncope, No Seizures, Muscle Weakness, Paresthesia, Vision Disturbance or Loss, No Diplopia, No Vertigo, No Difficulty Walking,  Skin: No Rash or Lesions. Psych: No Change in Mood or Affect, No Depression or Anxiety, No Memory loss, No Confusion, or Hallucinations   Past Medical History  Diagnosis Date  . Hypertension   . Diabetes mellitus without complication   . CHF (congestive heart failure)      Past Surgical History  Procedure Laterality Date  . Toe amputation    . Right foot surgery        Prior to Admission medications   Medication Sig Start Date End Date Taking? Authorizing Provider  amLODipine (NORVASC) 10 MG tablet Take 1 tablet (10 mg total) by mouth daily. 12/13/13  Yes Eugenie Filler, MD  carvedilol (COREG) 25 MG tablet Take 25 mg by mouth daily. 08/12/14  Yes Historical Provider, MD  cetirizine (ZYRTEC) 10 MG tablet Take 10 mg by mouth daily. 08/19/14  Yes Historical Provider, MD  fluticasone (FLONASE) 50 MCG/ACT nasal spray Place 1 spray into both nostrils daily. 08/19/14  Yes Historical Provider, MD  gabapentin (NEURONTIN) 300 MG capsule Take 300 mg by mouth 3 (three) times daily. 08/22/14  Yes Historical Provider, MD  hydrochlorothiazide (HYDRODIURIL) 25 MG tablet Take  25 mg by mouth daily. 08/12/14  Yes Historical Provider, MD  labetalol (NORMODYNE) 300 MG tablet Take 1 tablet (300 mg total) by mouth 2 (two) times daily. 12/13/13  Yes Eugenie Filler, MD  losartan (COZAAR) 50 MG tablet Take 50 mg by mouth daily. 08/12/14  Yes Historical Provider, MD  nicotine (NICODERM CQ - DOSED IN MG/24 HOURS) 21 mg/24hr patch Place 21 mg onto the skin daily. 07/16/14  Yes Historical Provider, MD  NOVOLOG MIX 70/30 FLEXPEN  (70-30) 100 UNIT/ML FlexPen Inject 14-36 Units as directed 2 (two) times daily. 36 units in AM; 14 units in PM. 08/22/14  Yes Historical Provider, MD  oxyCODONE-acetaminophen (ROXICET) 5-325 MG per tablet Take 1-2 tablets by mouth every 4 (four) hours as needed for severe pain. 12/13/13  Yes Eugenie Filler, MD  trimethoprim-polymyxin b (POLYTRIM) ophthalmic solution Place 1 drop into the left eye every 6 (six) hours. For 7 days. 09/01/14  Yes Historical Provider, MD  docusate sodium 100 MG CAPS Take 100 mg by mouth 2 (two) times daily. Patient not taking: Reported on 07/11/2014 12/13/13   Eugenie Filler, MD  gabapentin (NEURONTIN) 100 MG capsule Take 100 mg by mouth 3 (three) times daily.    Historical Provider, MD  hydrALAZINE (APRESOLINE) 25 MG tablet Take 1 tablet (25 mg total) by mouth every 8 (eight) hours. Patient not taking: Reported on 07/11/2014 12/13/13   Eugenie Filler, MD  nicotine (NICODERM CQ - DOSED IN MG/24 HOURS) 14 mg/24hr patch Place 1 patch (14 mg total) onto the skin daily. Patient not taking: Reported on 07/11/2014 12/13/13   Eugenie Filler, MD     Allergies  Allergen Reactions  . Influenza Vaccines     Chest congestion, fever  . Pneumococcal Vaccines     Nausea, vomiting, fever  . Morphine And Related Other (See Comments)    Social History:  reports that he has been smoking Cigarettes and Cigars.  He has a 15 pack-year smoking history. He quit smokeless tobacco use about 30 years ago. His smokeless tobacco use included Snuff and Chew. He reports that he drinks alcohol. He reports that he does not use illicit drugs.    Family History  Problem Relation Age of Onset  . CAD Mother   . Diabetes type II Father   . Hypertension Sister   . CAD Sister   . Diabetes type II Brother        Physical Exam:  GEN:  Pleasant Obese 49 y.o. African American male examined and in no acute distress; cooperative with exam Filed Vitals:   09/06/14 0040 09/06/14 0108 09/06/14  0130 09/06/14 0241  BP:  160/87 167/80 157/87  Pulse:  74 80 78  Temp: 97.5 F (36.4 C)   97.9 F (36.6 C)  TempSrc: Oral   Oral  Resp:  15 16 18   Height:    6' (1.829 m)  Weight:    111.902 kg (246 lb 11.2 oz)  SpO2:  97% 98% 99%   Blood pressure 157/87, pulse 78, temperature 97.9 F (36.6 C), temperature source Oral, resp. rate 18, height 6' (1.829 m), weight 111.902 kg (246 lb 11.2 oz), SpO2 99 %. PSYCH: He is alert and oriented x4; does not appear anxious does not appear depressed; affect is normal HEENT: Normocephalic and Atraumatic, Right eye: +Conjunctival Erythema, No exudate, PERRLA; EOM intact; Fundi:  Benign;  No scleral icterus, Mucous membranes pink;  Nares: Patent, Oropharynx: Clear, Edentulous    Neck:  FROM,  No Cervical Lymphadenopathy nor Thyromegaly or Carotid Bruit; No JVD; Breasts:: Not examined CHEST WALL: No tenderness CHEST: Normal respiration, clear to auscultation bilaterally HEART: Regular rate and rhythm; no murmurs rubs or gallops BACK: No kyphosis or scoliosis; No CVA tenderness ABDOMEN: Positive Bowel Sounds, Obese, Soft Non-Tender, No Rebound or Guarding; No Masses, No Organomegaly. Rectal Exam: Not done EXTREMITIES: No Cyanosis, Clubbing, or Edema; No Ulcerations. Genitalia: not examined PULSES: 2+ and symmetric SKIN: Normal hydration no rash or ulceration CNS:  Alert and Oriented x 4, No Focal Deficits Vascular: pulses palpable throughout    Labs on Admission:  Basic Metabolic Panel:  Recent Labs Lab 09/05/14 2336  NA 139  K 4.1  CL 110  CO2 22  GLUCOSE 23*  BUN 40*  CREATININE 3.47*  CALCIUM 8.9   Liver Function Tests:  Recent Labs Lab 09/05/14 2336  AST 20  ALT 23  ALKPHOS 208*  BILITOT 0.3  PROT 8.1  ALBUMIN 3.8   No results for input(s): LIPASE, AMYLASE in the last 168 hours. No results for input(s): AMMONIA in the last 168 hours. CBC:  Recent Labs Lab 09/05/14 2336  WBC 8.5  NEUTROABS 4.9  HGB 12.5*  HCT 37.5*    MCV 92.6  PLT 238   Cardiac Enzymes: No results for input(s): CKTOTAL, CKMB, CKMBINDEX, TROPONINI in the last 168 hours.  BNP (last 3 results) No results for input(s): BNP in the last 8760 hours.  ProBNP (last 3 results) No results for input(s): PROBNP in the last 8760 hours.  CBG:  Recent Labs Lab 09/05/14 2324 09/05/14 2349 09/06/14 0039  GLUCAP 20* 87 41*    Radiological Exams on Admission: No results found.    Assessment/Plan:   49 y.o. male with  Active Problems:   1.   Acute glaucoma of right eye   Per recommendations of Opthalmology Start:      Xalatan 0.005%  1 drop to Rt Eye    Alphagan 0.2% 1 drop to Right Eye    Trusopt 2%  1 drop to Right Eye   Opthalmology to See in AM      2.   Hypoglycemia   Hold 70/30 Insulin until stabilized glucose levels   CBG check every 1 hour x 6     Follow Hypoglycemic Protocol PRN    Adjust 70/30 Insulin - reduced to 15 units BID     3.   Acute-on-chronic kidney injury-  Today Cr = 3.47, and  Last Cr = 2.54 (1 month ago)   Monitor BUN/CR   Hold Losartan, and HCTZ Rx   Gentle IVFs     4.  Diabetic foot ulcer   Wound Care Evaluation   Wound Care BID     5.  CHF - Diastolic, EF = [id number]   Continue Carvedilol Rx   HCTZ  and Losartan on hold due to #3      6.  HTN (hypertension)   Continue Hydralazine, Carvedilol, and Amlodipine Rx   Holding Losartan and HCTZ due to #3     7.  CKD (chronic kidney disease), stage III   Baseline Cr = 2.27         8.   Type I diabetes mellitus with complication, uncontrolled   On Insulin 70/30  36 units SQ in AM and 14 units SQ in PM   Check Hb A1C in AM previous HbA1C = 6.2 in 12/12/2014   SSI coverage PRN     9.   Anemia- with  Normocytic Indices, MCV =92.6   Anemia Panel sent   FOBT q AM x 3   10.  Tobacco use disorder   Nicotine Patch 14 mg daily   Encouraged to continue to decrease his cigarette use until quit   11.  DVT Prophylaxis      SCDs       Code  Status:     FULL CODE     Family Communication:    No Family Present    Disposition Plan:    Inpatient  Status        Time spent:  [facility] Pager [phone]   If [place] Please Contact the Day Rounding Team MD for [facility]  If 7PM-7AM, Please Contact Night-Floor Coverage  [email or web address] Password [email or web address] 09/06/2014, 3:13 AM     ADDENDUM:   Patient was seen and examined on 09/06/2014

## 2014-09-06 NOTE — ED Provider Notes (Signed)
CSN: [id number]     Arrival date & time 09/05/14  2142 History   First MD Initiated Contact with Patient 09/05/14 2303     Chief Complaint  Patient presents with  . Eye Problem     (Consider location/radiation/quality/duration/timing/severity/associated sxs/prior Treatment) HPI Rolled male presents to the emergency department with complaint of right eye redness and pain with loss of vision.  Initial history was difficult to obtain as patient was confused.  CBG noted to be 20, after D50 and orange juice, patient recovered well.  Patient reports that he has had pain to the right eye with discharge from both eyes on and off for last 2-3 weeks.  He was seen by his primary care doctor on Monday who prescribed an eye and about drop.  He has been using that, reports the left eye is much better, but over the last week he has noted decreasing vision in the right eye.  At this time.  He is only able to see shapes.  He reports his vision has always been bad, but that now it is much worse.  Patient has history of hypertension and diabetes.  He reports he has never seen an eye doctor, but has an appointment with a new one on Tuesday.  He does not know the name or clinic to which he is supposed to go to on Tuesday. Past Medical History  Diagnosis Date  . Hypertension   . Diabetes mellitus without complication   . CHF (congestive heart failure)    Past Surgical History  Procedure Laterality Date  . Toe amputation    . Right foot surgery     Family History  Problem Relation Age of Onset  . CAD Mother   . Diabetes type II Father   . Hypertension Sister   . CAD Sister   . Diabetes type II Brother    History  Substance Use Topics  . Smoking status: Current Every Day Smoker -- 0.50 packs/day for 30 years    Types: Cigarettes, Cigars  . Smokeless tobacco: Former Systems developer    Types: Snuff, Chew    Quit date: 12/11/1983  . Alcohol Use: Yes     Comment: [email or web address]     Review of Systems  Eyes:  Positive for pain, discharge, redness and visual disturbance.  Neurological: Positive for headaches.      Allergies  Influenza vaccines; Pneumococcal vaccines; and Morphine and related  Home Medications   Prior to Admission medications   Medication Sig Start Date End Date Taking? Authorizing Provider  amLODipine (NORVASC) 10 MG tablet Take 1 tablet (10 mg total) by mouth daily. 12/13/13  Yes Eugenie Filler, MD  carvedilol (COREG) 25 MG tablet Take 25 mg by mouth daily. 08/12/14  Yes Historical Provider, MD  cetirizine (ZYRTEC) 10 MG tablet Take 10 mg by mouth daily. 08/19/14  Yes Historical Provider, MD  fluticasone (FLONASE) 50 MCG/ACT nasal spray Place 1 spray into both nostrils daily. 08/19/14  Yes Historical Provider, MD  gabapentin (NEURONTIN) 300 MG capsule Take 300 mg by mouth 3 (three) times daily. 08/22/14  Yes Historical Provider, MD  hydrochlorothiazide (HYDRODIURIL) 25 MG tablet Take 25 mg by mouth daily. 08/12/14  Yes Historical Provider, MD  labetalol (NORMODYNE) 300 MG tablet Take 1 tablet (300 mg total) by mouth 2 (two) times daily. 12/13/13  Yes Eugenie Filler, MD  losartan (COZAAR) 50 MG tablet Take 50 mg by mouth daily. 08/12/14  Yes Historical Provider, MD  nicotine (NICODERM CQ - DOSED  IN MG/24 HOURS) 21 mg/24hr patch Place 21 mg onto the skin daily. 07/16/14  Yes Historical Provider, MD  NOVOLOG MIX 70/30 FLEXPEN (70-30) 100 UNIT/ML FlexPen Inject 14-36 Units as directed 2 (two) times daily. 36 units in AM; 14 units in PM. 08/22/14  Yes Historical Provider, MD  oxyCODONE-acetaminophen (ROXICET) 5-325 MG per tablet Take 1-2 tablets by mouth every 4 (four) hours as needed for severe pain. 12/13/13  Yes Eugenie Filler, MD  trimethoprim-polymyxin b (POLYTRIM) ophthalmic solution Place 1 drop into the left eye every 6 (six) hours. For 7 days. 09/01/14  Yes Historical Provider, MD  docusate sodium 100 MG CAPS Take 100 mg by mouth 2 (two) times daily. Patient not taking: Reported on  07/11/2014 12/13/13   Eugenie Filler, MD  gabapentin (NEURONTIN) 100 MG capsule Take 100 mg by mouth 3 (three) times daily.    Historical Provider, MD  hydrALAZINE (APRESOLINE) 25 MG tablet Take 1 tablet (25 mg total) by mouth every 8 (eight) hours. Patient not taking: Reported on 07/11/2014 12/13/13   Eugenie Filler, MD  nicotine (NICODERM CQ - DOSED IN MG/24 HOURS) 14 mg/24hr patch Place 1 patch (14 mg total) onto the skin daily. Patient not taking: Reported on 07/11/2014 12/13/13   Eugenie Filler, MD   BP 164/85 mmHg  Pulse 82  Temp(Src) 100 F (37.8 C) (Oral)  Resp 20  SpO2 96% Physical Exam  Constitutional: He is oriented to person, place, and time. He appears well-developed and well-nourished.  HENT:  Head: Normocephalic and atraumatic.  Nose: Nose normal.  Mouth/Throat: Oropharynx is clear and moist.  Eyes: Lids are normal. Lids are everted and swept, no foreign bodies found. Right eye exhibits no chemosis, no discharge, no exudate and no hordeolum. No foreign body present in the right eye. Left eye exhibits no chemosis, no discharge, no exudate and no hordeolum. No foreign body present in the left eye. Right conjunctiva is injected. Left conjunctiva is not injected. Left conjunctiva has no hemorrhage. No scleral icterus. Right pupil is not reactive.  Cornea on right is steamy in appearance.  Globe is firm to palpation.  Pupil is 6 mm and fixed.  Patient reports he is able to see light and shapes only in right vision.  Vision in left eye is 20/100  Neck: Normal range of motion. Neck supple. No JVD present. No tracheal deviation present. No thyromegaly present.  Cardiovascular: Normal rate, regular rhythm, normal heart sounds and intact distal pulses.  Exam reveals no gallop and no friction rub.   No murmur heard. Pulmonary/Chest: Effort normal and breath sounds normal. No stridor. No respiratory distress. He has no wheezes. He has no rales. He exhibits no tenderness.  Abdominal:  Soft. Bowel sounds are normal. He exhibits no distension and no mass. There is no tenderness. There is no rebound and no guarding.  Musculoskeletal: Normal range of motion. He exhibits no edema or tenderness.  Lymphadenopathy:    He has no cervical adenopathy.  Neurological: He is alert and oriented to person, place, and time. He displays normal reflexes. He exhibits normal muscle tone. Coordination normal.  Skin: Skin is warm and dry. No rash noted. No erythema. No pallor.  Psychiatric: He has a normal mood and affect. His behavior is normal. Judgment and thought content normal.  Nursing note and vitals reviewed.   ED Course  Procedures (including critical care time) Labs Review Labs Reviewed  BASIC METABOLIC PANEL - Abnormal; Notable for the following:  Glucose, Bld 23 (*)    BUN 40 (*)    Creatinine, Ser 3.47 (*)    GFR calc non Af Amer 19 (*)    GFR calc Af Amer 22 (*)    All other components within normal limits  CBC WITH DIFFERENTIAL/PLATELET - Abnormal; Notable for the following:    RBC 4.05 (*)    Hemoglobin 12.5 (*)    HCT 37.5 (*)    Monocytes Relative 14 (*)    Monocytes Absolute 1.2 (*)    All other components within normal limits  HEPATIC FUNCTION PANEL - Abnormal; Notable for the following:    Alkaline Phosphatase 208 (*)    Indirect Bilirubin 0.2 (*)    All other components within normal limits  CBG MONITORING, ED - Abnormal; Notable for the following:    Glucose-Capillary 20 (*)    All other components within normal limits  URINALYSIS, ROUTINE W REFLEX MICROSCOPIC  I-STAT CG4 LACTIC ACID, ED  CBG MONITORING, ED    Imaging Review No results found.   EKG Interpretation None     CRITICAL CARE Performed by: Kalman Drape Total critical care time: 30 min Critical care time was exclusive of separately billable procedures and treating other patients. Critical care was necessary to treat or prevent imminent or life-threatening deterioration. Critical care  was time spent personally by me on the following activities: development of treatment plan with patient and/or surrogate as well as nursing, discussions with consultants, evaluation of patient's response to treatment, examination of patient, obtaining history from patient or surrogate, ordering and performing treatments and interventions, ordering and review of laboratory studies, ordering and review of radiographic studies, pulse oximetry and re-evaluation of patient's condition.  MDM   Final diagnoses:  Acute glaucoma of right eye  Acute-on-chronic kidney injury  Hypoglycemia    49 year old male with hypoglycemia, which has responded to intervention, and right eye acute glaucoma.  Will discuss with on-call ophthalmology.  Case was discussed with Dr. Deon Pilling on call for ophthalmology.  He recommends discussing with nephrology if patient given acute kidney injury is safe to take oral Diamox.  He recommends timolol, latanoprost and Alphagan  with plus minus diamox or dorzolamide to help with intraocular pressures.  Patient has had repeat drop of his blood sugar.  With hypoglycemia with acute kidney injury, will discuss with hospitalist for admission.  Dr. Edmonia James with nephrology has recommended stopping losartan given the acute kidney injury.  He does not recommend Diamox, but is okay with dorzolamide.  Case discussed with Dr. Arnoldo Morale who will admit.  Patient updated on findings and plan.  Linton Flemings, MD 09/06/14 [phone]

## 2014-09-06 NOTE — Progress Notes (Signed)
Patient admitted overnight, H&P reviewed, patient examined this morning. Admitted with right eye pain and decreased vision. I discussed with Dr. Valetta Close from ophthalmology this morning, patient will need treatment at [facility] on Monday or Tuesday. Doesn't need urgent inpatient transfer, and he plans to arrange follow up. He has acute on chronic renal failure and mild hyperkalemia, will do one dose of kayexalate.   Home tomorrow if renal function stable and outpatient ophthalmology follow up in 1-2 days.  Lotoya Casella M. Cruzita Lederer, MD [facility] [phone]

## 2014-09-06 NOTE — Consult Note (Signed)
WOC wound consult note Reason for Consult:Diabetic foot ulceration on left foot, plantar aspect, 5th metatarsal head. Patient is followed in the community by Dr. Meridee Score and was last seen twi weeks ago. Wound type:Neuropathic Pressure Ulcer POA: No Measurement:1cm x 1.5cm x 0.2cm Wound [id number], moist Drainage (amount, consistency, odor) scant serosanguinous Periwound:intact Dressing procedure/placement/frequency:I will implement a twice daily saline dressing.  Patient is to minimize weight bearing since he doe not have his orthotic here and follow up upon discharge with Dr. Sharol Given. [place] nursing team will not follow, but will remain available to this patient, the nursing and medical team.  Please re-consult if needed. Thanks, Maudie Flakes, MSN, RN, [place], [place], [place] [phone])

## 2014-09-07 LAB — GLUCOSE, CAPILLARY
GLUCOSE-CAPILLARY: 149 mg/dL — AB (ref 70–99)
GLUCOSE-CAPILLARY: 131 mg/dL — AB (ref 70–99)
GLUCOSE-CAPILLARY: 93 mg/dL (ref 70–99)
Glucose-Capillary: 138 mg/dL — ABNORMAL HIGH (ref 70–99)
Glucose-Capillary: 114 mg/dL — ABNORMAL HIGH (ref 70–99)

## 2014-09-07 LAB — BASIC METABOLIC PANEL
ANION GAP: 7 (ref 5–15)
BUN: 41 mg/dL — AB (ref 6–23)
CALCIUM: 8.6 mg/dL (ref 8.4–10.5)
CHLORIDE: 106 mmol/L (ref 96–112)
CO2: 25 mmol/L (ref 19–32)
Creatinine, Ser: 3.38 mg/dL — ABNORMAL HIGH (ref 0.50–1.35)
GFR calc non Af Amer: 20 mL/min — ABNORMAL LOW (ref >90)
GFR, EST AFRICAN AMERICAN: 23 mL/min — AB (ref >90)
GLUCOSE: 126 mg/dL — AB (ref 70–99)
Potassium: 4.5 mmol/L (ref 3.5–5.1)
Sodium: 138 mmol/L (ref 135–145)

## 2014-09-07 MED ORDER — SODIUM CHLORIDE 0.9 % IV SOLN
INTRAVENOUS | Status: AC
  Administered 2014-09-07: 11:00:00 via INTRAVENOUS

## 2014-09-07 NOTE — Progress Notes (Signed)
[facility] PROGRESS NOTE Assessment/Plan:  Acute glaucoma of right eye: - Per recommendations of Opthalmology Started:Xalatan to Rt Eye, Alphagan to Right Eye, Trusopt Right Eye. - ophtalmomlogy to make follow up for 4.25.2016  Hypoglycemia/ Type I diabetes mellitus with complication, uncontrolled: - controlled on current regimen.  Acute-on-chronic kidney injury: - worsening, start IV fluid, hold ARB, ARB II and nephrotoxic drugs. If improved or stabilize home in am. - baseline around 2.5   Diabetic foot ulcer - Wound Care Evaluation -Wound Care BID  Chronic Diastolic, EF = A999333 - hold HCTZ and ARB II  Essential HTN (hypertension) -Continue Hydralazine, Carvedilol, and Amlodipine Rx - Holding Losartan and HCTZ due to #3   Anemia- with Normocytic Indices, MCV =92.6 - ferritin 200  obacco use disorder Nicotine Patch 14 mg daily Encouraged to continue to decrease his cigarette use until quit  Code Status: full Family Communication: none  Disposition Plan: home in am when Cr stabilizes   Consultants:  Ophtalmology  Procedures:  Non  Antibiotics:  None  HPI/Subjective: No complains wants to go home in am  Objective: Filed Vitals:   09/07/14 0000 09/07/14 0221 09/07/14 0515 09/07/14 0657  BP:  146/84 150/90 156/69  Pulse: 98 98  94  Temp:  98.8 F (37.1 C)  98.5 F (36.9 C)  TempSrc:  Oral  Oral  Resp:      Height:      Weight:      SpO2:  98%  98%    Intake/Output Summary (Last 24 hours) at 09/07/14 0945 Last data filed at 09/07/14 0826  Gross per 24 hour  Intake   1440 ml  Output      0 ml  Net   1440 ml   Filed Weights   09/06/14 0241  Weight: 111.902 kg (246 lb 11.2 oz)    Exam:  General: Alert, awake, oriented x3, in no acute distress.  HEENT: No bruits, no goiter.  Heart: Regular rate and rhythm. Lungs: Good air movement, clear  Abdomen: Soft, nontender,  nondistended, positive bowel sounds.  Neuro: Grossly intact, nonfocal.   Data Reviewed: Basic Metabolic Panel:  Recent Labs Lab 09/05/14 2336 09/06/14 0536 09/07/14 0521  NA 139 138 138  K 4.1 5.3* 4.5  CL 110 108 106  CO2 22 23 25   GLUCOSE 23* 143* 126*  BUN 40* 39* 41*  CREATININE 3.47* 3.19* 3.38*  CALCIUM 8.9 8.6 8.6   Liver Function Tests:  Recent Labs Lab 09/05/14 2336  AST 20  ALT 23  ALKPHOS 208*  BILITOT 0.3  PROT 8.1  ALBUMIN 3.8   No results for input(s): LIPASE, AMYLASE in the last 168 hours. No results for input(s): AMMONIA in the last 168 hours. CBC:  Recent Labs Lab 09/05/14 2336 09/06/14 0536  WBC 8.5 7.3  NEUTROABS 4.9  --   HGB 12.5* 12.2*  HCT 37.5* 37.1*  MCV 92.6 93.5  PLT 238 216   Cardiac Enzymes: No results for input(s): CKTOTAL, CKMB, CKMBINDEX, TROPONINI in the last 168 hours. BNP (last 3 results) No results for input(s): BNP in the last 8760 hours.  ProBNP (last 3 results) No results for input(s): PROBNP in the last 8760 hours.  CBG:  Recent Labs Lab 09/06/14 1535 09/06/14 2004 09/06/14 2354 09/07/14 0411 09/07/14 0735  GLUCAP 118* 120* 131* 138* 114*    No results found for this or any previous visit (from the past 240 hour(s)).   Studies: No results found.  Scheduled Meds: .  amLODipine  10 mg Oral Daily  . brimonidine  1 drop Right Eye TID  . docusate sodium  100 mg Oral BID  . dorzolamide-timolol  1 drop Right Eye BID  . gabapentin  300 mg Oral TID  . hydrALAZINE  25 mg Oral 3 times per day  . insulin aspart  0-9 Units Subcutaneous 6 times per day  . insulin aspart protamine- aspart  10 Units Subcutaneous BID WC  . latanoprost  1 drop Right Eye QHS  . loratadine  10 mg Oral Daily  . nicotine  14 mg Transdermal Daily   Continuous Infusions: . sodium chloride      Time Spent: 25 min   Charlynne Cousins  [facility] Pager [phone]. If 7PM-7AM, please contact night-coverage at  [email or web address], password [facility] 09/07/2014, 9:45 AM  LOS: 1 day

## 2014-09-08 ENCOUNTER — Inpatient Hospital Stay (HOSPITAL_COMMUNITY): Payer: Medicaid Other

## 2014-09-08 LAB — GLUCOSE, CAPILLARY
GLUCOSE-CAPILLARY: 116 mg/dL — AB (ref 70–99)
GLUCOSE-CAPILLARY: 140 mg/dL — AB (ref 70–99)
Glucose-Capillary: 139 mg/dL — ABNORMAL HIGH (ref 70–99)
Glucose-Capillary: 139 mg/dL — ABNORMAL HIGH (ref 70–99)
Glucose-Capillary: 141 mg/dL — ABNORMAL HIGH (ref 70–99)
Glucose-Capillary: 97 mg/dL (ref 70–99)

## 2014-09-08 LAB — HEMOGLOBIN A1C
Hgb A1c MFr Bld: 10.7 % — ABNORMAL HIGH (ref 4.8–5.6)
MEAN PLASMA GLUCOSE: 260 mg/dL

## 2014-09-08 MED ORDER — TAMSULOSIN HCL 0.4 MG PO CAPS
0.4000 mg | ORAL_CAPSULE | Freq: Every day | ORAL | Status: DC
  Administered 2014-09-08 – 2014-09-09 (×2): 0.4 mg via ORAL
  Filled 2014-09-08 (×2): qty 1

## 2014-09-08 MED ORDER — HYDRALAZINE HCL 20 MG/ML IJ SOLN
10.0000 mg | Freq: Four times a day (QID) | INTRAMUSCULAR | Status: DC | PRN
  Administered 2014-09-08 – 2014-09-09 (×2): 10 mg via INTRAVENOUS
  Filled 2014-09-08 (×3): qty 1

## 2014-09-08 MED ORDER — SODIUM CHLORIDE 0.9 % IV BOLUS (SEPSIS)
500.0000 mL | Freq: Once | INTRAVENOUS | Status: AC
  Administered 2014-09-08: 500 mL via INTRAVENOUS

## 2014-09-08 MED ORDER — HYDRALAZINE HCL 50 MG PO TABS
50.0000 mg | ORAL_TABLET | Freq: Three times a day (TID) | ORAL | Status: DC
  Administered 2014-09-08 – 2014-09-09 (×3): 50 mg via ORAL
  Filled 2014-09-08 (×6): qty 1

## 2014-09-08 MED ORDER — SODIUM CHLORIDE 0.9 % IV SOLN
INTRAVENOUS | Status: DC
  Administered 2014-09-08: 13:00:00 via INTRAVENOUS

## 2014-09-08 NOTE — Progress Notes (Signed)
[facility] PROGRESS NOTE Assessment/Plan:  Acute glaucoma of right eye: - Per recommendations of Opthalmology Started: Xalatan to Rt Eye, Alphagan to Right Eye, Trusopt Right Eye. - Patient to follow-up with ophthalmology on 09/08/2014  Hypoglycemia/ Type I diabetes mellitus with complication, uncontrolled: - controlled on current regimen.  Acute-on-chronic kidney injury: -At baseline patient appears to have a creatinine near 2.5. -Today creatinine increasing to 3.38 from 3.19 of yesterday's lab work. We'll challenge him with IV fluids today, provide 500 mL bolus of normal saline followed by maintenance fluids at 100 mL's an hour overnight. On exam he does not appear volume overloaded. -ARB therapy held. Bladder scan today showed residual of 680 mL's, however, after RN discussed the need for catheter he went to the bathroom and urinated, with postvoid residual of 17 mL. Started Flomax 0.4 mg. Will check a renal ultrasound.   Diabetic foot ulcer - Wound Care Evaluation -Wound Care BID  Chronic Diastolic, EF = A999333 - holdimg HCTZ and ARB II due to acute on chronic kidney injury -Patient receiving IV fluids today, will monitor his volume status closely  Essential HTN (hypertension) -Continue Hydralazine, Carvedilol, and Amlodipine Rx - Holding Losartan and HCTZ due to #3   Anemia- with Normocytic Indices, MCV =92.6 - ferritin 200  Tobacco use disorder Nicotine Patch 14 mg daily Encouraged to continue to decrease his cigarette use until quit  Code Status: full Family Communication: none  Disposition Plan: home in am when Cr stabilizes   Consultants:  Ophtalmology  Procedures:  Non  Antibiotics:  None  HPI/Subjective: Patient wanted to go home today. He reports having little urine output this morning  Objective: Filed Vitals:   09/08/14 0624 09/08/14 1025 09/08/14 1302 09/08/14 1435  BP: 161/73 159/78  185/88 173/83  Pulse: 88 82  83  Temp:  98.7 F (37.1 C)  98.3 F (36.8 C)  TempSrc:  Oral  Oral  Resp: 16 20  20   Height:      Weight:      SpO2: 99% 100%  99%    Intake/Output Summary (Last 24 hours) at 09/08/14 1526 Last data filed at 09/07/14 2300  Gross per 24 hour  Intake 883.67 ml  Output      0 ml  Net 883.67 ml   Filed Weights   09/06/14 0241 09/07/14 1944  Weight: 111.902 kg (246 lb 11.2 oz) 113.2 kg (249 lb 9 oz)    Exam:  General: Alert, awake, oriented x3, in no acute distress.  HEENT: No bruits, no goiter.  Heart: Regular rate and rhythm. Lungs: Good air movement, clear  Abdomen: Soft, nontender, nondistended, positive bowel sounds.  Neuro: Grossly intact, nonfocal.   Data Reviewed: Basic Metabolic Panel:  Recent Labs Lab 09/05/14 2336 09/06/14 0536 09/07/14 0521  NA 139 138 138  K 4.1 5.3* 4.5  CL 110 108 106  CO2 22 23 25   GLUCOSE 23* 143* 126*  BUN 40* 39* 41*  CREATININE 3.47* 3.19* 3.38*  CALCIUM 8.9 8.6 8.6   Liver Function Tests:  Recent Labs Lab 09/05/14 2336  AST 20  ALT 23  ALKPHOS 208*  BILITOT 0.3  PROT 8.1  ALBUMIN 3.8   No results for input(s): LIPASE, AMYLASE in the last 168 hours. No results for input(s): AMMONIA in the last 168 hours. CBC:  Recent Labs Lab 09/05/14 2336 09/06/14 0536  WBC 8.5 7.3  NEUTROABS 4.9  --   HGB 12.5* 12.2*  HCT 37.5* 37.1*  MCV 92.6 93.5  PLT 238 216   Cardiac Enzymes: No results for input(s): CKTOTAL, CKMB, CKMBINDEX, TROPONINI in the last 168 hours. BNP (last 3 results) No results for input(s): BNP in the last 8760 hours.  ProBNP (last 3 results) No results for input(s): PROBNP in the last 8760 hours.  CBG:  Recent Labs Lab 09/07/14 1950 09/08/14 0001 09/08/14 0354 09/08/14 0717 09/08/14 1111  GLUCAP 93 97 139* 116* 141*    No results found for this or any previous visit (from the past 240 hour(s)).   Studies: US Renal  09/08/2014   CLINICAL DATA:  Renal  failure.  EXAM: RENAL / URINARY TRACT ULTRASOUND COMPLETE  COMPARISON:  December 11, 2013.  FINDINGS: Right Kidney:  Length: 11.2 cm. Increased echogenicity is noted suggesting medical renal disease. No mass or hydronephrosis visualized.  Left Kidney:  Length: 12.0 cm. Increased echogenicity is noted suggesting medical renal disease. 3 cm cyst is seen in lower pole. No mass or hydronephrosis visualized.  Bladder:  Appears normal for degree of bladder distention.  IMPRESSION: Increased echogenicity of renal parenchyma is noted bilaterally suggesting medical renal disease. No hydronephrosis or renal obstruction is noted.   Electronically Signed   By: Marijo Conception, M.D.   On: 09/08/2014 15:02    Scheduled Meds: . amLODipine  10 mg Oral Daily  . brimonidine  1 drop Right Eye TID  . docusate sodium  100 mg Oral BID  . dorzolamide-timolol  1 drop Right Eye BID  . gabapentin  300 mg Oral TID  . hydrALAZINE  50 mg Oral 3 times per day  . insulin aspart  0-9 Units Subcutaneous 6 times per day  . insulin aspart protamine- aspart  10 Units Subcutaneous BID WC  . latanoprost  1 drop Right Eye QHS  . loratadine  10 mg Oral Daily  . nicotine  14 mg Transdermal Daily  . tamsulosin  0.4 mg Oral Daily   Continuous Infusions: . sodium chloride 75 mL/hr at 09/08/14 1307    Time Spent: 30 min   Kelvin Cellar  [facility] Pager [phone]. If 7PM-7AM, please contact night-coverage at [email or web address], password [facility] 09/08/2014, 3:26 PM  LOS: 2 days

## 2014-09-08 NOTE — Progress Notes (Signed)
CARE MANAGEMENT NOTE 09/08/2014  Patient:  Cody Webster, Cody Webster   Account Number:  [id number]  Date Initiated:  09/08/2014  Documentation initiated by:  Edwyna Shell  Subjective/Objective Assessment:   49 yo male admitted with hypoglycemia from home     Action/Plan:   discharge planning   Anticipated DC Date:  09/10/2014   Anticipated DC Plan:  [place]  CM consult      Choice offered to / List presented to:             Status of service:   Medicare Important Message given?   (If response is "NO", the following Medicare IM given date fields will be blank) Date Medicare IM given:   Medicare IM given by:   Date Additional Medicare IM given:   Additional Medicare IM given by:    Discharge Disposition:    Per UR Regulation:    If discussed at Long Length of Stay Meetings, dates discussed:    Comments:  09/08/14 Leanne Chang RN BSN CM [phone] Patient stated that he lives at home alone and drives self to appointments. He stated that he would like to find a new PCP. Provided a list of Medicaid PCP's but explained that he has to call first to make sure the specific PCP is accepting new patients and then he has to have the new office placed on the Medicaid card prior to changing PCPs. Patient verbalized understanding.

## 2014-09-08 NOTE — Progress Notes (Signed)
UR complete 

## 2014-09-09 LAB — CBC
HCT: 36.2 % — ABNORMAL LOW (ref 39.0–52.0)
HEMOGLOBIN: 11.8 g/dL — AB (ref 13.0–17.0)
MCH: 30.6 pg (ref 26.0–34.0)
MCHC: 32.6 g/dL (ref 30.0–36.0)
MCV: 94 fL (ref 78.0–100.0)
PLATELETS: 214 10*3/uL (ref 150–400)
RBC: 3.85 MIL/uL — AB (ref 4.22–5.81)
RDW: 13.2 % (ref 11.5–15.5)
WBC: 5.8 10*3/uL (ref 4.0–10.5)

## 2014-09-09 LAB — BASIC METABOLIC PANEL
ANION GAP: 7 (ref 5–15)
BUN: 40 mg/dL — ABNORMAL HIGH (ref 6–23)
CALCIUM: 8.7 mg/dL (ref 8.4–10.5)
CO2: 22 mmol/L (ref 19–32)
CREATININE: 2.97 mg/dL — AB (ref 0.50–1.35)
Chloride: 110 mmol/L (ref 96–112)
GFR calc Af Amer: 27 mL/min — ABNORMAL LOW (ref >90)
GFR, EST NON AFRICAN AMERICAN: 23 mL/min — AB (ref >90)
GLUCOSE: 114 mg/dL — AB (ref 70–99)
Potassium: 5 mmol/L (ref 3.5–5.1)
SODIUM: 139 mmol/L (ref 135–145)

## 2014-09-09 LAB — GLUCOSE, CAPILLARY
GLUCOSE-CAPILLARY: 106 mg/dL — AB (ref 70–99)
Glucose-Capillary: 101 mg/dL — ABNORMAL HIGH (ref 70–99)
Glucose-Capillary: 127 mg/dL — ABNORMAL HIGH (ref 70–99)
Glucose-Capillary: 92 mg/dL (ref 70–99)

## 2014-09-09 MED ORDER — DORZOLAMIDE HCL-TIMOLOL MAL 2-0.5 % OP SOLN: 1.0000 [drp] | Freq: Two times a day (BID) | OPHTHALMIC | Status: DC

## 2014-09-09 MED ORDER — LATANOPROST 0.005 % OP SOLN: 1.0000 [drp] | Freq: Every day | OPHTHALMIC | Status: DC

## 2014-09-09 MED ORDER — BRIMONIDINE TARTRATE 0.15 % OP SOLN: 1.0000 [drp] | Freq: Three times a day (TID) | OPHTHALMIC | Status: DC

## 2014-09-09 MED ORDER — OXYCODONE-ACETAMINOPHEN 5-325 MG PO TABS: 1.0000 | ORAL_TABLET | ORAL | Status: DC | PRN

## 2014-09-09 NOTE — Progress Notes (Signed)
OPHTHALMOLOGY CONSULT NOTE  Date: 09/06/14 Time: 8:34 AM  Patient Name: Cody Webster  DOB: 1965-12-04 MRN: [id number]  Reason for Consult: Elevated eye pressure  Follow up visit: Patient still in hospital for renal insufficiency, had increase in creatinine yesterday. Plan for fluid challenge today. Pt states vision slightly improved but eye still uncomfortable and red. Patient has noted slight reduction in swelling.   Initial HPI:  This is a 49 y.o. African American male who presented to [facility] with recent dx of conjuctivitis due to right eye pain and redness. Pt states that his vision has gradually gotten worse over the last several weeks and he has recently noted increased redness and eye pain. Pt was seen in the ED where tonopen measured 51. I recommended that cosopt, brimonidine, and latanoprost be administered at around 1:30 am then again at 3:30 with a plan to re-evaluate the iop in the AM. Pt states that he continues to have eye pain this am.   Prior to Admission medications   Medication Sig Start Date End Date Taking? Authorizing Provider  amLODipine (NORVASC) 10 MG tablet Take 1 tablet (10 mg total) by mouth daily. 12/13/13  Yes Eugenie Filler, MD  carvedilol (COREG) 25 MG tablet Take 25 mg by mouth daily. 08/12/14  Yes Historical Provider, MD  cetirizine (ZYRTEC) 10 MG tablet Take 10 mg by mouth daily. 08/19/14  Yes Historical Provider, MD  fluticasone (FLONASE) 50 MCG/ACT nasal spray Place 1 spray into both nostrils daily. 08/19/14  Yes Historical Provider, MD  gabapentin (NEURONTIN) 300 MG capsule Take 300 mg by mouth 3 (three) times daily. 08/22/14  Yes Historical Provider, MD  hydrochlorothiazide (HYDRODIURIL) 25 MG tablet Take 25 mg by mouth daily. 08/12/14  Yes Historical Provider, MD  labetalol (NORMODYNE) 300 MG tablet Take 1 tablet (300 mg total) by mouth 2 (two) times daily. 12/13/13  Yes Eugenie Filler, MD  losartan (COZAAR) 50 MG tablet Take 50 mg by mouth daily. 08/12/14  Yes  Historical Provider, MD  nicotine (NICODERM CQ - DOSED IN MG/24 HOURS) 21 mg/24hr patch Place 21 mg onto the skin daily. 07/16/14  Yes Historical Provider, MD  NOVOLOG MIX 70/30 FLEXPEN (70-30) 100 UNIT/ML FlexPen Inject 14-36 Units as directed 2 (two) times daily. 36 units in AM; 14 units in PM. 08/22/14  Yes Historical Provider, MD  oxyCODONE-acetaminophen (ROXICET) 5-325 MG per tablet Take 1-2 tablets by mouth every 4 (four) hours as needed for severe pain. 12/13/13  Yes Eugenie Filler, MD  trimethoprim-polymyxin b (POLYTRIM) ophthalmic solution Place 1 drop into the left eye every 6 (six) hours. For 7 days. 09/01/14  Yes Historical Provider, MD  docusate sodium 100 MG CAPS Take 100 mg by mouth 2 (two) times daily. Patient not taking: Reported on 07/11/2014 12/13/13   Eugenie Filler, MD  gabapentin (NEURONTIN) 100 MG capsule Take 100 mg by mouth 3 (three) times daily.    Historical Provider, MD  hydrALAZINE (APRESOLINE) 25 MG tablet Take 1 tablet (25 mg total) by mouth every 8 (eight) hours. Patient not taking: Reported on 07/11/2014 12/13/13   Eugenie Filler, MD  nicotine (NICODERM CQ - DOSED IN MG/24 HOURS) 14 mg/24hr patch Place 1 patch (14 mg total) onto the skin daily. Patient not taking: Reported on 07/11/2014 12/13/13   Eugenie Filler, MD    Past Medical History  Diagnosis Date  . Hypertension   . Diabetes mellitus without complication   . CHF (congestive heart failure)  family history includes CAD in his mother and sister; Diabetes type II in his brother and father; Hypertension in his sister.  Social History   Occupational History  . Not on file.   Social History Main Topics  . Smoking status: Current Every Day Smoker -- 0.50 packs/day for 30 years    Types: Cigarettes, Cigars  . Smokeless tobacco: Former Systems developer    Types: Snuff, Chew    Quit date: 12/11/1983  . Alcohol Use: Yes     Comment: ocassaionlly   . Drug Use: No  . Sexual Activity: Not on file    Allergies   Allergen Reactions  . Influenza Vaccines     Chest congestion, fever  . Pneumococcal Vaccines     Nausea, vomiting, fever  . Morphine And Related Other (See Comments)    ROS: Positive as above, otherwise negative.  EXAM:  Mental Status: A&O x 3   Base Exam: Right Eye Left Eye  Visual Acuity (At near) CF at 2 ft   IOP (applanation) 40   Pupillary Exam    Motility Full  Full   Confrontation VF Unable Full    Anterior Segment Exam    Lids/Lashes WNL WNL  Conjuctiva 3+ injection White and Quiet  Cornea 3+ microcystic edema Clear  Anterior Chamber Deep  Deep and Quiet  Iris Round, non reactive, [place] Round, Reactive  Lens NSC NSC       Radiographic Studies Reviewed: None US Renal  Oct 07, 2014   CLINICAL DATA:  Renal failure.  EXAM: RENAL / URINARY TRACT ULTRASOUND COMPLETE  COMPARISON:  December 11, 2013.  FINDINGS: Right Kidney:  Length: 11.2 cm. Increased echogenicity is noted suggesting medical renal disease. No mass or hydronephrosis visualized.  Left Kidney:  Length: 12.0 cm. Increased echogenicity is noted suggesting medical renal disease. 3 cm cyst is seen in lower pole. No mass or hydronephrosis visualized.  Bladder:  Appears normal for degree of bladder distention.  IMPRESSION: Increased echogenicity of renal parenchyma is noted bilaterally suggesting medical renal disease. No hydronephrosis or renal obstruction is noted.   Electronically Signed   By: Marijo Conception, M.D.   On: 2014/10/07 15:02    Assessment and Recommendation: 1. Severe neovascular glaucoma right eye:  -Suspect CRVO od but no good view given corneal edema.   -Continue Cosopt BID, Brimonidine TID, Latanoprost QHS, IOP stable today  -Unable to use oral medications (diamox) due to recent renal insufficiency for which patient is hospitalized.   - No further intervention indicated at this time.  -If renal function continues to decline, will have to DC dorzolamide  -Will arrange fu for Thursday at [facility] to be  evaluated by glaucoma specialist.   Please call with any questions.  Jola Schmidt MD [facility] Ophthalmology [phone]

## 2014-09-09 NOTE — Progress Notes (Signed)
Discharge instructions and medications reviewed with patient. Patient verbalizes understanding and has no questions at this time. Patient confirms he has all personal belongings in his possession. Patient discharged home. 

## 2014-09-09 NOTE — Discharge Instructions (Signed)
Follow with EDWARDS, MICHELLE P, NP in 5-7 days  Please get a complete blood count and chemistry panel checked by your Primary MD at your next visit, and again as instructed by your Primary MD. Please get your medications reviewed and adjusted by your Primary MD.  Please request your Primary MD to go over all Hospital Tests and Procedure/Radiological results at the follow up, please get all Hospital records sent to your Prim MD by signing hospital release before you go home.  If you had Pneumonia of Lung problems at the Hospital: Please get a 2 view Chest X ray done in 6-8 weeks after hospital discharge or sooner if instructed by your Primary MD.  If you have Congestive Heart Failure: Please call your Cardiologist or Primary MD anytime you have any of the following symptoms:  1) 3 pound weight gain in 24 hours or 5 pounds in 1 week  2) shortness of breath, with or without a dry hacking cough  3) swelling in the hands, feet or stomach  4) if you have to sleep on extra pillows at night in order to breathe  Follow cardiac low salt diet and 1.5 lit/day fluid restriction.  If you have diabetes Accuchecks 4 times/day, Once in AM empty stomach and then before each meal. Log in all results and show them to your primary doctor at your next visit. If any glucose reading is under 80 or above 300 call your primary MD immediately.  If you have Seizure/Convulsions/Epilepsy: Please do not drive, operate heavy machinery, participate in activities at heights or participate in high speed sports until you have seen by Primary MD or a Neurologist and advised to do so again.  If you had Gastrointestinal Bleeding: Please ask your Primary MD to check a complete blood count within one week of discharge or at your next visit. Your endoscopic/colonoscopic biopsies that are pending at the time of discharge, will also need to followed by your Primary MD.  Get Medicines reviewed and adjusted. Please take all your  medications with you for your next visit with your Primary MD  Please request your Primary MD to go over all hospital tests and procedure/radiological results at the follow up, please ask your Primary MD to get all Hospital records sent to his/her office.  If you experience worsening of your admission symptoms, develop shortness of breath, life threatening emergency, suicidal or homicidal thoughts you must seek medical attention immediately by calling 911 or calling your MD immediately  if symptoms less severe.  You must read complete instructions/literature along with all the possible adverse reactions/side effects for all the Medicines you take and that have been prescribed to you. Take any new Medicines after you have completely understood and accpet all the possible adverse reactions/side effects.   Do not drive or operate heavy machinery when taking Pain medications.   Do not take more than prescribed Pain, Sleep and Anxiety Medications  Special Instructions: If you have smoked or chewed Tobacco  in the last 2 yrs please stop smoking, stop any regular Alcohol  and or any Recreational drug use.  Wear Seat belts while driving.  Please note You were cared for by a hospitalist during your hospital stay. If you have any questions about your discharge medications or the care you received while you were in the hospital after you are discharged, you can call the unit and asked to speak with the hospitalist on call if the hospitalist that took care of you is not available.  Once you are discharged, your primary care physician will handle any further medical issues. Please note that NO REFILLS for any discharge medications will be authorized once you are discharged, as it is imperative that you return to your primary care physician (or establish a relationship with a primary care physician if you do not have one) for your aftercare needs so that they can reassess your need for medications and monitor your  lab values. ° °You can reach the hospitalist office at phone [phone] or fax [phone] °  °If you do not have a primary care physician, you can call [phone] for a physician referral. ° °Activity: As tolerated with Full fall precautions use walker/cane & assistance as needed ° °Diet: diabetic ° °Disposition Home ° ° °

## 2014-09-09 NOTE — Discharge Summary (Signed)
Physician Discharge Summary  Cody Webster [id number] DOB: 12/05/1965 DOA: 09/05/2014  PCP: Kerin Perna, NP  Admit date: 09/05/2014 Discharge date: 09/09/2014  Time spent: > 30 minutes  Recommendations for Outpatient Follow-up:  1. Follow up with Dr. Oletta Lamas in 1 week 2. Follow up with [facility] in 1 week, supposed to have an appointment on the day of d/c which he had to cancel 3. Follow up with ophthalmology in 2 days at [facility]  Discharge Diagnoses:  Active Problems:   Diabetic foot ulcer   HTN (hypertension)   CKD (chronic kidney disease), stage III   Hypoglycemia   Acute glaucoma of right eye   Acute-on-chronic kidney injury   Type I diabetes mellitus with complication, uncontrolled   Anemia   Tobacco use disorder  Discharge Condition: stable  Diet recommendation: heart healthy  Filed Weights   09/06/14 0241 09/07/14 1944  Weight: 111.902 kg (246 lb 11.2 oz) 113.2 kg (249 lb 9 oz)    History of present illness:  Cody Webster is a 49 y.o. male with a history of Uncontrolled IDDM, HTN, CHF with an EF =50% (on 2D ECHO 06/2014), and CKD Stage III who presents to the ED with complaints of worsening of Right eye Pain and decreased vision over the past 3 days. He reports that he has had redness of both eyes for 2-3 weeks, and he thought he had pink eye. He was prescribed an antibiotiic Eye drop on 04/18, but his right eye continued to have pain and redness, and then he began to have decreased vision in the right eye. In the ED, he was evaluated by the EDP Dr. Sharol Given, who consulted Dr. and Opthalmology was consulted and recommended initial treatment for acute Glaucoma, and will see the patient in the AM. He was also found to have hypoglycemia with glucose levels to 23, and he was treated with IV Dextrose. He also has worsening of his renal function, and Nephrology on call was called and advised discontinuation of his ARB Rx for now. He was referred  for medial admission.  Hospital Course:   Acute glaucoma of right eye: - Per recommendations of Opthalmology Started: Xalatan to Rt Eye, Alphagan to Right Eye, Trusopt Right Eye. - Patient to follow-up with ophthalmology on 09/11/2014 at [facility] Hypoglycemia/ Type I diabetes mellitus with complication, uncontrolled: - controlled on current regimen. Acute-on-chronic kidney injury: - At baseline patient appears to have a creatinine near 2.5, patient with mild dehydration, after fluid challenge renal function improving and nearing his baseline. He will follow up with Nephrology as an outpatient. I have discontinued his ARB and HCTZ on discharge.  Diabetic foot ulcer - Wound Care Evaluation -Wound Care BID Chronic Diastolic, EF = [id number] - stable, euvolemic on discharge Essential HTN (hypertension) -Continue Carvedilol, and Amlodipine Rx Anemia- with Normocytic Indices, MCV =92.6 - ferritin 200 Tobacco use disorder - counseled for cessation  Procedures:  None    Consultations:  Ophthalmology   Discharge Exam: Filed Vitals:   09/09/14 0547 09/09/14 0759 09/09/14 0954 09/09/14 1319  BP: 155/85 156/83 160/81 173/95  Pulse:  96 90 98  Temp:   98.1 F (36.7 C) 97.8 F (36.6 C)  TempSrc:   Oral Oral  Resp:   20 18  Height:      Weight:      SpO2:   98% 100%    General: NAD Cardiovascular: RRR Respiratory: CTA biL  Discharge Instructions     Medication List  STOP taking these medications        hydrochlorothiazide 25 MG tablet  Commonly known as:  HYDRODIURIL     losartan 50 MG tablet  Commonly known as:  COZAAR     trimethoprim-polymyxin b ophthalmic solution  Commonly known as:  POLYTRIM      TAKE these medications        amLODipine 10 MG tablet  Commonly known as:  NORVASC  Take 1 tablet (10 mg total) by mouth daily.     brimonidine 0.15 % ophthalmic solution  Commonly known as:  ALPHAGAN  Place 1 drop into the right eye 3 (three) times daily.       carvedilol 25 MG tablet  Commonly known as:  COREG  Take 25 mg by mouth daily.     cetirizine 10 MG tablet  Commonly known as:  ZYRTEC  Take 10 mg by mouth daily.     dorzolamide-timolol 22.3-6.8 MG/ML ophthalmic solution  Commonly known as:  COSOPT  Place 1 drop into the right eye 2 (two) times daily.     DSS 100 MG Caps  Take 100 mg by mouth 2 (two) times daily.     fluticasone 50 MCG/ACT nasal spray  Commonly known as:  FLONASE  Place 1 spray into both nostrils daily.     gabapentin 300 MG capsule  Commonly known as:  NEURONTIN  Take 300 mg by mouth 3 (three) times daily.     hydrALAZINE 25 MG tablet  Commonly known as:  APRESOLINE  Take 1 tablet (25 mg total) by mouth every 8 (eight) hours.     labetalol 300 MG tablet  Commonly known as:  NORMODYNE  Take 1 tablet (300 mg total) by mouth 2 (two) times daily.     latanoprost 0.005 % ophthalmic solution  Commonly known as:  XALATAN  Place 1 drop into the right eye at bedtime.     nicotine 14 mg/24hr patch  Commonly known as:  NICODERM CQ - dosed in mg/24 hours  Place 1 patch (14 mg total) onto the skin daily.     NOVOLOG MIX 70/30 FLEXPEN (70-30) 100 UNIT/ML FlexPen  Generic drug:  insulin aspart protamine - aspart  Inject 14-36 Units as directed 2 (two) times daily. 36 units in AM; 14 units in PM.     oxyCODONE-acetaminophen 5-325 MG per tablet  Commonly known as:  ROXICET  Take 1-2 tablets by mouth every 4 (four) hours as needed for severe pain.           Follow-up Information    Follow up with EDWARDS, MICHELLE P, NP. Schedule an appointment as soon as possible for a visit on 09/19/2014.   Specialty:  Internal Medicine   Why:  Please follow up with Dr. Oletta Lamas on Friday, May 6th at 2:30pm   Contact information:   [place] [address] [phone]       Follow up with [facility] On 09/15/2014.   Why:  Please follow up with Dr. Clover Mealy on Monday, May 2nd at 3:30pm. Please fill out  and bring with you the new patient paperwork being mailed out to you.   Contact information:   [place] [address] [phone]       The results of significant diagnostics from this hospitalization (including imaging, microbiology, ancillary and laboratory) are listed below for reference.    Significant Diagnostic Studies: US Renal  09/08/2014   CLINICAL DATA:  Renal failure.  EXAM: RENAL /  URINARY TRACT ULTRASOUND COMPLETE  COMPARISON:  December 11, 2013.  FINDINGS: Right Kidney:  Length: 11.2 cm. Increased echogenicity is noted suggesting medical renal disease. No mass or hydronephrosis visualized.  Left Kidney:  Length: 12.0 cm. Increased echogenicity is noted suggesting medical renal disease. 3 cm cyst is seen in lower pole. No mass or hydronephrosis visualized.  Bladder:  Appears normal for degree of bladder distention.  IMPRESSION: Increased echogenicity of renal parenchyma is noted bilaterally suggesting medical renal disease. No hydronephrosis or renal obstruction is noted.   Electronically Signed   By: Marijo Conception, M.D.   On: 09/08/2014 15:02   Labs: Basic Metabolic Panel:  Recent Labs Lab 09/05/14 2336 09/06/14 0536 09/07/14 0521 09/09/14 0540  NA 139 138 138 139  K 4.1 5.3* 4.5 5.0  CL 110 108 106 110  CO2 22 23 25 22   GLUCOSE 23* 143* 126* 114*  BUN 40* 39* 41* 40*  CREATININE 3.47* 3.19* 3.38* 2.97*  CALCIUM 8.9 8.6 8.6 8.7   Liver Function Tests:  Recent Labs Lab 09/05/14 2336  AST 20  ALT 23  ALKPHOS 208*  BILITOT 0.3  PROT 8.1  ALBUMIN 3.8   CBC:  Recent Labs Lab 09/05/14 2336 09/06/14 0536 09/09/14 0540  WBC 8.5 7.3 5.8  NEUTROABS 4.9  --   --   HGB 12.5* 12.2* 11.8*  HCT 37.5* 37.1* 36.2*  MCV 92.6 93.5 94.0  PLT 238 216 214   CBG:  Recent Labs Lab 09/08/14 2005 09/09/14 0008 09/09/14 0403 09/09/14 0729 09/09/14 1126  GLUCAP 139* 92 106* 127* 101*    Signed:  GHERGHE, COSTIN  [facility] 09/09/2014, 6:31  PM

## 2014-09-25 ENCOUNTER — Observation Stay (HOSPITAL_COMMUNITY)
Admission: EM | Admit: 2014-09-25 | Discharge: 2014-09-27 | Disposition: A | Discharge status: Home / Self Care | Payer: Medicaid Other | Attending: Internal Medicine | Admitting: Internal Medicine

## 2014-09-25 ENCOUNTER — Encounter (HOSPITAL_COMMUNITY): Payer: Self-pay

## 2014-09-25 DIAGNOSIS — I5042 Chronic combined systolic (congestive) and diastolic (congestive) heart failure: Secondary | ICD-10-CM | POA: Diagnosis not present

## 2014-09-25 DIAGNOSIS — N179 Acute kidney failure, unspecified: Secondary | ICD-10-CM | POA: Diagnosis not present

## 2014-09-25 DIAGNOSIS — H5711 Ocular pain, right eye: Secondary | ICD-10-CM | POA: Diagnosis not present

## 2014-09-25 DIAGNOSIS — N183 Chronic kidney disease, stage 3 (moderate): Secondary | ICD-10-CM | POA: Diagnosis not present

## 2014-09-25 DIAGNOSIS — H5710 Ocular pain, unspecified eye: Secondary | ICD-10-CM | POA: Insufficient documentation

## 2014-09-25 DIAGNOSIS — I10 Essential (primary) hypertension: Secondary | ICD-10-CM | POA: Diagnosis present

## 2014-09-25 DIAGNOSIS — E1122 Type 2 diabetes mellitus with diabetic chronic kidney disease: Secondary | ICD-10-CM | POA: Insufficient documentation

## 2014-09-25 DIAGNOSIS — N189 Chronic kidney disease, unspecified: Secondary | ICD-10-CM | POA: Diagnosis present

## 2014-09-25 DIAGNOSIS — R112 Nausea with vomiting, unspecified: Secondary | ICD-10-CM | POA: Diagnosis present

## 2014-09-25 DIAGNOSIS — E1165 Type 2 diabetes mellitus with hyperglycemia: Secondary | ICD-10-CM | POA: Diagnosis not present

## 2014-09-25 DIAGNOSIS — Z89429 Acquired absence of other toe(s), unspecified side: Secondary | ICD-10-CM | POA: Insufficient documentation

## 2014-09-25 DIAGNOSIS — F17210 Nicotine dependence, cigarettes, uncomplicated: Secondary | ICD-10-CM | POA: Diagnosis not present

## 2014-09-25 DIAGNOSIS — N185 Chronic kidney disease, stage 5: Secondary | ICD-10-CM | POA: Diagnosis present

## 2014-09-25 DIAGNOSIS — I160 Hypertensive urgency: Secondary | ICD-10-CM

## 2014-09-25 DIAGNOSIS — I129 Hypertensive chronic kidney disease with stage 1 through stage 4 chronic kidney disease, or unspecified chronic kidney disease: Principal | ICD-10-CM | POA: Insufficient documentation

## 2014-09-25 DIAGNOSIS — H409 Unspecified glaucoma: Secondary | ICD-10-CM | POA: Diagnosis present

## 2014-09-25 DIAGNOSIS — N184 Chronic kidney disease, stage 4 (severe): Secondary | ICD-10-CM | POA: Diagnosis present

## 2014-09-25 DIAGNOSIS — D631 Anemia in chronic kidney disease: Secondary | ICD-10-CM | POA: Diagnosis not present

## 2014-09-25 LAB — GLUCOSE, CAPILLARY
GLUCOSE-CAPILLARY: 141 mg/dL — AB (ref 65–99)
Glucose-Capillary: 141 mg/dL — ABNORMAL HIGH (ref 65–99)

## 2014-09-25 LAB — CBC WITH DIFFERENTIAL/PLATELET
BASOS ABS: 0 10*3/uL (ref 0.0–0.1)
BASOS PCT: 0 % (ref 0–1)
Eosinophils Absolute: 0 10*3/uL (ref 0.0–0.7)
Eosinophils Relative: 0 % (ref 0–5)
HCT: 33.7 % — ABNORMAL LOW (ref 39.0–52.0)
Hemoglobin: 11.1 g/dL — ABNORMAL LOW (ref 13.0–17.0)
Lymphocytes Relative: 15 % (ref 12–46)
Lymphs Abs: 1.1 10*3/uL (ref 0.7–4.0)
MCH: 30.6 pg (ref 26.0–34.0)
MCHC: 32.9 g/dL (ref 30.0–36.0)
MCV: 92.8 fL (ref 78.0–100.0)
MONO ABS: 0.3 10*3/uL (ref 0.1–1.0)
Monocytes Relative: 4 % (ref 3–12)
NEUTROS PCT: 81 % — AB (ref 43–77)
Neutro Abs: 5.9 10*3/uL (ref 1.7–7.7)
Platelets: 235 10*3/uL (ref 150–400)
RBC: 3.63 MIL/uL — ABNORMAL LOW (ref 4.22–5.81)
RDW: 13.3 % (ref 11.5–15.5)
WBC: 7.4 10*3/uL (ref 4.0–10.5)

## 2014-09-25 LAB — BASIC METABOLIC PANEL
ANION GAP: 11 (ref 5–15)
BUN: 35 mg/dL — ABNORMAL HIGH (ref 6–20)
CHLORIDE: 105 mmol/L (ref 101–111)
CO2: 24 mmol/L (ref 22–32)
Calcium: 9.4 mg/dL (ref 8.9–10.3)
Creatinine, Ser: 3.97 mg/dL — ABNORMAL HIGH (ref 0.61–1.24)
GFR calc Af Amer: 19 mL/min — ABNORMAL LOW (ref >60)
GFR calc non Af Amer: 16 mL/min — ABNORMAL LOW (ref >60)
GLUCOSE: 207 mg/dL — AB (ref 65–99)
POTASSIUM: 4.7 mmol/L (ref 3.5–5.1)
SODIUM: 140 mmol/L (ref 135–145)

## 2014-09-25 LAB — URINALYSIS, ROUTINE W REFLEX MICROSCOPIC
Bilirubin Urine: NEGATIVE
Glucose, UA: 100 mg/dL — AB
Ketones, ur: NEGATIVE mg/dL
Leukocytes, UA: NEGATIVE
Nitrite: NEGATIVE
Specific Gravity, Urine: 1.016 (ref 1.005–1.030)
Urobilinogen, UA: 0.2 mg/dL (ref 0.0–1.0)
pH: 6.5 (ref 5.0–8.0)

## 2014-09-25 LAB — URINE MICROSCOPIC-ADD ON

## 2014-09-25 LAB — MRSA PCR SCREENING: MRSA by PCR: NEGATIVE

## 2014-09-25 LAB — CBG MONITORING, ED: GLUCOSE-CAPILLARY: 173 mg/dL — AB (ref 65–99)

## 2014-09-25 MED ORDER — LABETALOL HCL 300 MG PO TABS
300.0000 mg | ORAL_TABLET | Freq: Once | ORAL | Status: AC
  Administered 2014-09-25: 300 mg via ORAL
  Filled 2014-09-25: qty 1

## 2014-09-25 MED ORDER — OXYCODONE-ACETAMINOPHEN 5-325 MG PO TABS
1.0000 | ORAL_TABLET | ORAL | Status: DC | PRN
  Administered 2014-09-25 – 2014-09-27 (×3): 2 via ORAL
  Filled 2014-09-25 (×3): qty 2

## 2014-09-25 MED ORDER — HYDRALAZINE HCL 20 MG/ML IJ SOLN: 20.0000 mg | INTRAMUSCULAR | Status: DC | PRN

## 2014-09-25 MED ORDER — LABETALOL HCL 300 MG PO TABS
300.0000 mg | ORAL_TABLET | Freq: Two times a day (BID) | ORAL | Status: DC
  Administered 2014-09-25 – 2014-09-27 (×4): 300 mg via ORAL
  Filled 2014-09-25 (×7): qty 1

## 2014-09-25 MED ORDER — PREDNISOLONE ACETATE 1 % OP SUSP
1.0000 [drp] | Freq: Once | OPHTHALMIC | Status: AC
  Administered 2014-09-25: 1 [drp] via OPHTHALMIC
  Filled 2014-09-25: qty 1

## 2014-09-25 MED ORDER — PREDNISOLONE ACETATE 1 % OP SUSP
1.0000 [drp] | Freq: Every day | OPHTHALMIC | Status: DC
  Filled 2014-09-25: qty 1

## 2014-09-25 MED ORDER — ZOLPIDEM TARTRATE 5 MG PO TABS
5.0000 mg | ORAL_TABLET | Freq: Every evening | ORAL | Status: DC | PRN
  Administered 2014-09-25: 5 mg via ORAL
  Filled 2014-09-25: qty 1

## 2014-09-25 MED ORDER — ATROPINE SULFATE 1 % OP SOLN
1.0000 [drp] | Freq: Once | OPHTHALMIC | Status: AC
  Administered 2014-09-25: 1 [drp] via OPHTHALMIC
  Filled 2014-09-25: qty 2

## 2014-09-25 MED ORDER — DORZOLAMIDE HCL-TIMOLOL MAL 2-0.5 % OP SOLN
1.0000 [drp] | Freq: Two times a day (BID) | OPHTHALMIC | Status: DC
  Administered 2014-09-25 – 2014-09-27 (×4): 1 [drp] via OPHTHALMIC
  Filled 2014-09-25: qty 10

## 2014-09-25 MED ORDER — HYDROMORPHONE HCL 1 MG/ML IJ SOLN
1.0000 mg | Freq: Once | INTRAMUSCULAR | Status: AC
  Administered 2014-09-25: 1 mg via INTRAVENOUS
  Filled 2014-09-25: qty 1

## 2014-09-25 MED ORDER — BRIMONIDINE TARTRATE 0.15 % OP SOLN
1.0000 [drp] | Freq: Three times a day (TID) | OPHTHALMIC | Status: DC
  Administered 2014-09-25 – 2014-09-27 (×5): 1 [drp] via OPHTHALMIC
  Filled 2014-09-25: qty 5

## 2014-09-25 MED ORDER — ONDANSETRON HCL 4 MG PO TABS: 4.0000 mg | ORAL_TABLET | Freq: Four times a day (QID) | ORAL | Status: DC | PRN

## 2014-09-25 MED ORDER — AMLODIPINE BESYLATE 10 MG PO TABS: 10.0000 mg | ORAL_TABLET | Freq: Every day | ORAL | Status: DC

## 2014-09-25 MED ORDER — LABETALOL HCL 5 MG/ML IV SOLN
10.0000 mg | Freq: Once | INTRAVENOUS | Status: AC
  Administered 2014-09-25: 10 mg via INTRAVENOUS
  Filled 2014-09-25: qty 4

## 2014-09-25 MED ORDER — HYDROCHLOROTHIAZIDE 25 MG PO TABS
25.0000 mg | ORAL_TABLET | Freq: Every day | ORAL | Status: DC
  Administered 2014-09-26 – 2014-09-27 (×2): 25 mg via ORAL
  Filled 2014-09-25 (×2): qty 1

## 2014-09-25 MED ORDER — GABAPENTIN 300 MG PO CAPS
300.0000 mg | ORAL_CAPSULE | Freq: Three times a day (TID) | ORAL | Status: DC
  Administered 2014-09-25 – 2014-09-27 (×5): 300 mg via ORAL
  Filled 2014-09-25 (×5): qty 1

## 2014-09-25 MED ORDER — ACETAMINOPHEN 325 MG PO TABS: 650.0000 mg | ORAL_TABLET | Freq: Four times a day (QID) | ORAL | Status: DC | PRN

## 2014-09-25 MED ORDER — AMLODIPINE BESYLATE 10 MG PO TABS
10.0000 mg | ORAL_TABLET | Freq: Every day | ORAL | Status: DC
  Administered 2014-09-26 – 2014-09-27 (×2): 10 mg via ORAL
  Filled 2014-09-25 (×2): qty 1

## 2014-09-25 MED ORDER — LOSARTAN POTASSIUM 50 MG PO TABS
50.0000 mg | ORAL_TABLET | Freq: Every day | ORAL | Status: DC
  Administered 2014-09-26 – 2014-09-27 (×2): 50 mg via ORAL
  Filled 2014-09-25 (×2): qty 1

## 2014-09-25 MED ORDER — HYDRALAZINE HCL 20 MG/ML IJ SOLN
20.0000 mg | Freq: Once | INTRAMUSCULAR | Status: AC
  Administered 2014-09-25: 20 mg via INTRAVENOUS
  Filled 2014-09-25: qty 1

## 2014-09-25 MED ORDER — PREDNISOLONE ACETATE 1 % OP SUSP
1.0000 [drp] | OPHTHALMIC | Status: DC
  Administered 2014-09-25 – 2014-09-27 (×17): 1 [drp] via OPHTHALMIC
  Filled 2014-09-25: qty 1

## 2014-09-25 MED ORDER — LOSARTAN POTASSIUM 50 MG PO TABS
50.0000 mg | ORAL_TABLET | Freq: Every day | ORAL | Status: DC
  Filled 2014-09-25: qty 1

## 2014-09-25 MED ORDER — SODIUM CHLORIDE 0.9 % IV BOLUS (SEPSIS)
500.0000 mL | Freq: Once | INTRAVENOUS | Status: AC
  Administered 2014-09-25: 500 mL via INTRAVENOUS

## 2014-09-25 MED ORDER — CARVEDILOL 25 MG PO TABS
25.0000 mg | ORAL_TABLET | Freq: Every day | ORAL | Status: DC
  Administered 2014-09-25 – 2014-09-27 (×3): 25 mg via ORAL
  Filled 2014-09-25: qty 2
  Filled 2014-09-25: qty 1
  Filled 2014-09-25: qty 2

## 2014-09-25 MED ORDER — NICARDIPINE HCL IN NACL 20-0.86 MG/200ML-% IV SOLN
3.0000 mg/h | Freq: Once | INTRAVENOUS | Status: DC
  Filled 2014-09-25: qty 200

## 2014-09-25 MED ORDER — FLUTICASONE PROPIONATE 50 MCG/ACT NA SUSP
1.0000 | Freq: Every day | NASAL | Status: DC
  Administered 2014-09-25 – 2014-09-27 (×3): 1 via NASAL
  Filled 2014-09-25: qty 16

## 2014-09-25 MED ORDER — ONDANSETRON HCL 4 MG/2ML IJ SOLN
4.0000 mg | Freq: Once | INTRAMUSCULAR | Status: AC
  Administered 2014-09-25: 4 mg via INTRAVENOUS
  Filled 2014-09-25: qty 2

## 2014-09-25 MED ORDER — SODIUM CHLORIDE 0.9 % IV SOLN
INTRAVENOUS | Status: DC
  Administered 2014-09-25: 17:00:00 via INTRAVENOUS

## 2014-09-25 MED ORDER — AMLODIPINE BESYLATE 10 MG PO TABS
10.0000 mg | ORAL_TABLET | Freq: Once | ORAL | Status: AC
  Administered 2014-09-25: 10 mg via ORAL
  Filled 2014-09-25: qty 1

## 2014-09-25 MED ORDER — HYDROMORPHONE HCL 1 MG/ML IJ SOLN
1.0000 mg | INTRAMUSCULAR | Status: DC | PRN
  Administered 2014-09-25 – 2014-09-27 (×8): 1 mg via INTRAVENOUS
  Filled 2014-09-25 (×8): qty 1

## 2014-09-25 MED ORDER — INSULIN ASPART 100 UNIT/ML ~~LOC~~ SOLN
0.0000 [IU] | Freq: Three times a day (TID) | SUBCUTANEOUS | Status: DC
  Administered 2014-09-25 – 2014-09-26 (×2): 1 [IU] via SUBCUTANEOUS
  Administered 2014-09-26: 2 [IU] via SUBCUTANEOUS
  Administered 2014-09-26 – 2014-09-27 (×2): 1 [IU] via SUBCUTANEOUS

## 2014-09-25 MED ORDER — PROPARACAINE HCL 0.5 % OP SOLN
1.0000 [drp] | Freq: Once | OPHTHALMIC | Status: AC
  Administered 2014-09-25: 1 [drp] via OPHTHALMIC
  Filled 2014-09-25: qty 15

## 2014-09-25 MED ORDER — HYDRALAZINE HCL 25 MG PO TABS
25.0000 mg | ORAL_TABLET | Freq: Three times a day (TID) | ORAL | Status: DC
  Administered 2014-09-25 – 2014-09-27 (×7): 25 mg via ORAL
  Filled 2014-09-25 (×8): qty 1

## 2014-09-25 MED ORDER — HYDROCHLOROTHIAZIDE 25 MG PO TABS: 25.0000 mg | ORAL_TABLET | Freq: Every day | ORAL | Status: DC

## 2014-09-25 MED ORDER — LATANOPROST 0.005 % OP SOLN
1.0000 [drp] | Freq: Every day | OPHTHALMIC | Status: DC
  Administered 2014-09-25 – 2014-09-26 (×2): 1 [drp] via OPHTHALMIC
  Filled 2014-09-25: qty 2.5

## 2014-09-25 MED ORDER — ONDANSETRON HCL 4 MG/2ML IJ SOLN
4.0000 mg | Freq: Four times a day (QID) | INTRAMUSCULAR | Status: DC | PRN
  Administered 2014-09-25 – 2014-09-26 (×2): 4 mg via INTRAVENOUS
  Filled 2014-09-25 (×2): qty 2

## 2014-09-25 MED ORDER — GABAPENTIN 300 MG PO CAPS: 300.0000 mg | ORAL_CAPSULE | Freq: Three times a day (TID) | ORAL | Status: DC

## 2014-09-25 NOTE — Progress Notes (Signed)
Inpatient Diabetes Program Recommendations  AACE/ADA: New Consensus Statement on Inpatient Glycemic Control (2013)  Target Ranges:  Prepandial:   less than 140 mg/dL      Peak postprandial:   less than 180 mg/dL (1-2 hours)      Critically ill patients:  140 - 180 mg/dL   Reason for Visit: Hyperglycemia  Diabetes history: DM2 Outpatient Diabetes medications: Previously on metformin Current orders for Inpatient glycemic control: None    Results for LIANG, STOLLE (MRN [id number]) as of 09/25/2014 15:33  Ref. Range 09/25/2014 08:22  Glucose Latest Ref Range: 65-99 mg/dL 207 (H)  Results for SATHYA, SCHLICHER (MRN [id number]) as of 09/25/2014 15:33  Ref. Range 09/06/2014 05:36  Hemoglobin A1C Latest Ref Range: 4.8-5.6 % 10.7 (H)   Recommendations: Add Novolog sensitive tidwc. Needs medication adjustment prior to discharge with HgbA1C of 10.7%.  Thank you. Lorenda Peck, RD, LDN, CDE Inpatient Diabetes Coordinator [phone]

## 2014-09-25 NOTE — ED Notes (Signed)
Per pt, had laser eye surgery yesterday.  Eye is throbbing.  Pt was given scripts.  Unable to get one eye drop d/t not at pharmacy.  Pt states he was not given any pain meds.

## 2014-09-25 NOTE — H&P (Signed)
PCP:   Kerin Perna, NP   Chief Complaint:  Eye pain  HPI: 49 yo male h/o htn, chf, dm had right eye surgery yesterday for his acute glaucoma at [facility] was not able to get his eye drops filled yesterday from his pharmacy because they did not have it comes in today for right eye pain, feeling nauseated and vomiting from pain unable to hold his meds down.  No fevers.  His eye surgeon at [facility] was called advised to check intraocular pressure and if less than 60 to continue his eye drops and postop eye pain was normal.  His eye pressure is 39.  However his sbp was over 200, along with some worsening renal function.  He has not been able to eat or drink much in a day.  Denies fevers.  No headache.  Pt has not had any vomiting since arrival to the ED.  We are asked to admit for bp control, and ivf for kidney disease.  Pt does not want to go to [facility] for continuiety of care due to difficulty for his family.  Review of Systems:  Positive and negative as per HPI otherwise all other systems are negative  Past Medical History: Past Medical History  Diagnosis Date  . Hypertension   . Diabetes mellitus without complication   . CHF (congestive heart failure)    Past Surgical History  Procedure Laterality Date  . Toe amputation    . Right foot surgery      Medications: Prior to Admission medications   Medication Sig Start Date End Date Taking? Authorizing Provider  acetaminophen (TYLENOL) 325 MG tablet Take 650 mg by mouth every 6 (six) hours as needed for moderate pain.   Yes Historical Provider, MD  amLODipine (NORVASC) 10 MG tablet Take 1 tablet (10 mg total) by mouth daily. 12/13/13  Yes Eugenie Filler, MD  brimonidine (ALPHAGAN) 0.15 % ophthalmic solution Place 1 drop into the right eye 3 (three) times daily. 09/09/14  Yes Costin Karlyne Greenspan, MD  carvedilol (COREG) 25 MG tablet Take 25 mg by mouth daily. 08/12/14  Yes Historical Provider, MD  cetirizine (ZYRTEC) 10 MG tablet Take 10  mg by mouth daily. 08/19/14  Yes Historical Provider, MD  dorzolamide-timolol (COSOPT) 22.3-6.8 MG/ML ophthalmic solution Place 1 drop into the right eye 2 (two) times daily. 09/09/14  Yes Costin Karlyne Greenspan, MD  fluticasone (FLONASE) 50 MCG/ACT nasal spray Place 1 spray into both nostrils daily. 08/19/14  Yes Historical Provider, MD  gabapentin (NEURONTIN) 300 MG capsule Take 300 mg by mouth 3 (three) times daily. 08/22/14  Yes Historical Provider, MD  hydrochlorothiazide (HYDRODIURIL) 25 MG tablet Take 25 mg by mouth daily. 08/12/14  Yes Historical Provider, MD  labetalol (NORMODYNE) 300 MG tablet Take 1 tablet (300 mg total) by mouth 2 (two) times daily. 12/13/13  Yes Eugenie Filler, MD  latanoprost (XALATAN) 0.005 % ophthalmic solution Place 1 drop into the right eye at bedtime. 09/09/14  Yes Costin Karlyne Greenspan, MD  losartan (COZAAR) 50 MG tablet Take 50 mg by mouth daily. 08/12/14  Yes Historical Provider, MD  oxyCODONE-acetaminophen (ROXICET) 5-325 MG per tablet Take 1-2 tablets by mouth every 4 (four) hours as needed for severe pain. 09/09/14  Yes Costin Karlyne Greenspan, MD  prednisoLONE acetate (PRED FORTE) 1 % ophthalmic suspension Place 1 drop into the right eye daily. 09/23/14  Yes Historical Provider, MD  docusate sodium 100 MG CAPS Take 100 mg by mouth 2 (two) times daily.  Patient not taking: Reported on 07/11/2014 12/13/13   Eugenie Filler, MD  hydrALAZINE (APRESOLINE) 25 MG tablet Take 1 tablet (25 mg total) by mouth every 8 (eight) hours. Patient not taking: Reported on 07/11/2014 12/13/13   Eugenie Filler, MD  nicotine (NICODERM CQ - DOSED IN MG/24 HOURS) 14 mg/24hr patch Place 1 patch (14 mg total) onto the skin daily. Patient not taking: Reported on 07/11/2014 12/13/13   Eugenie Filler, MD    Allergies:   Allergies  Allergen Reactions  . Influenza Vaccines     Chest congestion, fever  . Pneumococcal Vaccines     Nausea, vomiting, fever  . Morphine And Related Other (See Comments)     Hallucination and shortness of breath    Social History:  reports that he has been smoking Cigarettes and Cigars.  He has a 15 pack-year smoking history. He quit smokeless tobacco use about 30 years ago. His smokeless tobacco use included Snuff and Chew. He reports that he drinks alcohol. He reports that he does not use illicit drugs.  Family History: Family History  Problem Relation Age of Onset  . CAD Mother   . Diabetes type II Father   . Hypertension Sister   . CAD Sister   . Diabetes type II Brother     Physical Exam: Filed Vitals:   09/25/14 1130 09/25/14 1200 09/25/14 1230 09/25/14 1300  BP: 221/112 213/113 175/89 161/91  Pulse: 96 98 96   Temp:      TempSrc:      Resp: 16 13 13 12   SpO2: 100% 99% 99%    General appearance: alert, cooperative and no distress Head: Normocephalic, without obvious abnormality, atraumatic Eyes: negative Nose: Nares normal. Septum midline. Mucosa normal. No drainage or sinus tenderness. Neck: no JVD and supple, symmetrical, trachea midline Lungs: clear to auscultation bilaterally Heart: regular rate and rhythm, S1, S2 normal, no murmur, click, rub or gallop Abdomen: soft, non-tender; bowel sounds normal; no masses,  no organomegaly Extremities: extremities normal, atraumatic, no cyanosis or edema Pulses: 2+ and symmetric Skin: Skin color, texture, turgor normal. No rashes or lesions Neurologic: Grossly normal   Labs on Admission:   Recent Labs  09/25/14 0822  NA 140  K 4.7  CL 105  CO2 24  GLUCOSE 207*  BUN 35*  CREATININE 3.97*  CALCIUM 9.4    Recent Labs  09/25/14 0822  WBC 7.4  NEUTROABS 5.9  HGB 11.1*  HCT 33.7*  MCV 92.8  PLT 235   Radiological Exams on Admission: US Renal  09/08/2014   CLINICAL DATA:  Renal failure.  EXAM: RENAL / URINARY TRACT ULTRASOUND COMPLETE  COMPARISON:  December 11, 2013.  FINDINGS: Right Kidney:  Length: 11.2 cm. Increased echogenicity is noted suggesting medical renal disease. No mass  or hydronephrosis visualized.  Left Kidney:  Length: 12.0 cm. Increased echogenicity is noted suggesting medical renal disease. 3 cm cyst is seen in lower pole. No mass or hydronephrosis visualized.  Bladder:  Appears normal for degree of bladder distention.  IMPRESSION: Increased echogenicity of renal parenchyma is noted bilaterally suggesting medical renal disease. No hydronephrosis or renal obstruction is noted.   Electronically Signed   By: Marijo Conception, M.D.   On: 09/08/2014 15:02    Assessment/Plan  49 yo male with hypertensive urgency, worsening renal failure secondary to intolerance of po medications, n/v with recent glaucoma surgery to right eye.  Principal Problem:   Hypertensive urgency-  Will stop cardene gtt, and  give hydralazine 20mg  iv now.  Pt has received his po bp meds several hours ago and able to hold down.  obs on stepdown for close bp monitoring.  Prn hydralazine ordered hopefully restarting his home meds will improve his control.  Active Problems:   Diabetes-  Stable.     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  pred forte drops ordered.  He should have his drops from his pharm today or tomorrow.   Acute-on-chronic kidney injury-  Monitor, bump to almost 4.  ivf overnight.  Repeat in am.  obs on stepdown.  Full code.  dispo tomorrow once bp better controlled and renal function stabalizes.  Osborne Serio A 09/25/2014, 1:27 PM

## 2014-09-25 NOTE — ED Notes (Signed)
MD at bedside. 

## 2014-09-25 NOTE — ED Provider Notes (Signed)
CSN: [id number]     Arrival date & time 09/25/14  0734 History   First MD Initiated Contact with Patient 09/25/14 0754     Chief Complaint  Patient presents with  . Eye Pain     Patient is a 49 y.o. male presenting with eye pain. The history is provided by the patient. No language interpreter was used.  Eye Pain   Cody Webster presents for evaluation of right eye pain.  He had laser eye surgery yesterday and he developed severe pain in that eye after the medications wore off when he got home.  The surgeon was at [facility].  The pain is described like a toothache and is constant in nature.  He denies fevers, chest pain, diarrhea, dysuria. He has been vomiting all night.  Sxs are severe, constant, worsening.  He cannot see out of the right eye, which has been ongoing since the surgery.  He did not fill his medications that were prescribed after the surgery.   Past Medical History  Diagnosis Date  . Hypertension   . Diabetes mellitus without complication   . CHF (congestive heart failure)    Past Surgical History  Procedure Laterality Date  . Toe amputation    . Right foot surgery     Family History  Problem Relation Age of Onset  . CAD Mother   . Diabetes type II Father   . Hypertension Sister   . CAD Sister   . Diabetes type II Brother    History  Substance Use Topics  . Smoking status: Current Every Day Smoker -- 0.50 packs/day for 30 years    Types: Cigarettes, Cigars  . Smokeless tobacco: Former Systems developer    Types: Snuff, Chew    Quit date: 12/11/1983  . Alcohol Use: Yes     Comment: [email or web address]     Review of Systems  Eyes: Positive for pain.  All other systems reviewed and are negative.     Allergies  Influenza vaccines; Pneumococcal vaccines; and Morphine and related  Home Medications   Prior to Admission medications   Medication Sig Start Date End Date Taking? Authorizing Provider  amLODipine (NORVASC) 10 MG tablet Take 1 tablet (10 mg total) by mouth daily.  12/13/13   Eugenie Filler, MD  brimonidine (ALPHAGAN) 0.15 % ophthalmic solution Place 1 drop into the right eye 3 (three) times daily. 09/09/14   Costin Karlyne Greenspan, MD  carvedilol (COREG) 25 MG tablet Take 25 mg by mouth daily. 08/12/14   Historical Provider, MD  cetirizine (ZYRTEC) 10 MG tablet Take 10 mg by mouth daily. 08/19/14   Historical Provider, MD  docusate sodium 100 MG CAPS Take 100 mg by mouth 2 (two) times daily. Patient not taking: Reported on 07/11/2014 12/13/13   Eugenie Filler, MD  dorzolamide-timolol (COSOPT) 22.3-6.8 MG/ML ophthalmic solution Place 1 drop into the right eye 2 (two) times daily. 09/09/14   Costin Karlyne Greenspan, MD  fluticasone (FLONASE) 50 MCG/ACT nasal spray Place 1 spray into both nostrils daily. 08/19/14   Historical Provider, MD  gabapentin (NEURONTIN) 300 MG capsule Take 300 mg by mouth 3 (three) times daily. 08/22/14   Historical Provider, MD  hydrALAZINE (APRESOLINE) 25 MG tablet Take 1 tablet (25 mg total) by mouth every 8 (eight) hours. Patient not taking: Reported on 07/11/2014 12/13/13   Eugenie Filler, MD  labetalol (NORMODYNE) 300 MG tablet Take 1 tablet (300 mg total) by mouth 2 (two) times daily. 12/13/13   Eugenie Filler,  MD  latanoprost (XALATAN) 0.005 % ophthalmic solution Place 1 drop into the right eye at bedtime. 09/09/14   Costin Karlyne Greenspan, MD  nicotine (NICODERM CQ - DOSED IN MG/24 HOURS) 14 mg/24hr patch Place 1 patch (14 mg total) onto the skin daily. Patient not taking: Reported on 07/11/2014 12/13/13   Eugenie Filler, MD  NOVOLOG MIX 70/30 FLEXPEN (70-30) 100 UNIT/ML FlexPen Inject 14-36 Units as directed 2 (two) times daily. 36 units in AM; 14 units in PM. 08/22/14   Historical Provider, MD  oxyCODONE-acetaminophen (ROXICET) 5-325 MG per tablet Take 1-2 tablets by mouth every 4 (four) hours as needed for severe pain. 09/09/14   Costin Karlyne Greenspan, MD   BP 200/114 mmHg  Pulse 103  Temp(Src) 98.3 F (36.8 C) (Oral)  Resp 20  SpO2 100% Physical  Exam  Constitutional: He is oriented to person, place, and time. He appears well-developed and well-nourished. He appears distressed.  HENT:  Head: Normocephalic and atraumatic.  Eyes:  Left pupil midsized and reactive.  Right pupil dilated and nonreactive, corneal clouding, conjunctival injection and edema.  Photophobia. Can see light/dark out of right eye.  Right eye with pressure of 39 with tonopen.    Cardiovascular: Regular rhythm.   No murmur heard. tachycardic  Pulmonary/Chest: Effort normal and breath sounds normal. No respiratory distress.  Abdominal: Soft. There is no tenderness. There is no rebound and no guarding.  Musculoskeletal: He exhibits no edema or tenderness.  Neurological: He is alert and oriented to person, place, and time.  5/5 strength in all four extremities.   Skin: Skin is warm and dry.  Psychiatric: He has a normal mood and affect. His behavior is normal.  Nursing note and vitals reviewed.   ED Course  Procedures (including critical care time) CRITICAL CARE Performed by: Cody Webster   Total critical care time: 30 minutes  Critical care time was exclusive of separately billable procedures and treating other patients.  Critical care was necessary to treat or prevent imminent or life-threatening deterioration.  Critical care was time spent personally by me on the following activities: development of treatment plan with patient and/or surrogate as well as nursing, discussions with consultants, evaluation of patient's response to treatment, examination of patient, obtaining history from patient or surrogate, ordering and performing treatments and interventions, ordering and review of laboratory studies, ordering and review of radiographic studies, pulse oximetry and re-evaluation of patient's condition.  Labs Review Labs Reviewed - No data to display  Imaging Review No results found.   EKG Interpretation None      MDM   Final diagnoses:  Eye  pain, right  Acute-on-chronic kidney injury  Hypertensive urgency    0830 - discussed patient with Dr. Shon Baton with Ophthalmology at [facility]- she notes that patient will have some pain postoperatively.  Recommends checking ocular pressure - expects to be 60 or less in the right eye.  Patient was supposed to fill rx for prednisolone acetate 1% one drop q2hrs as well as atropine 1% one drop BID - these should help with pain control.    Pt here for right eye pain following laser surgery for glaucoma, also with vomiting, decreased oral intake, unable to take home meds for last 36 hours.  BMP demonstrates acute kidney injury, pt hypertensive in department.  Patient's pain is partially improved after IV pain meds, proparacaine drop to right eye.  Plan to provide patient's home BP meds and re-assess.    Pt with uncontrolled BP despite oral  home agents - given labetalol and started on cardene drip.  Discussed with hospitalist regarding admission.  Pt given additional IV hydralazine and cardene drip d/cd with continued improvement in BP.    Cody Reichert, MD 09/25/14 [phone]

## 2014-09-25 NOTE — ED Notes (Signed)
Family at bedside. 

## 2014-09-26 DIAGNOSIS — I10 Essential (primary) hypertension: Secondary | ICD-10-CM

## 2014-09-26 DIAGNOSIS — N184 Chronic kidney disease, stage 4 (severe): Secondary | ICD-10-CM | POA: Diagnosis not present

## 2014-09-26 DIAGNOSIS — N183 Chronic kidney disease, stage 3 (moderate): Secondary | ICD-10-CM | POA: Diagnosis not present

## 2014-09-26 DIAGNOSIS — E1129 Type 2 diabetes mellitus with other diabetic kidney complication: Secondary | ICD-10-CM

## 2014-09-26 DIAGNOSIS — H409 Unspecified glaucoma: Secondary | ICD-10-CM | POA: Diagnosis not present

## 2014-09-26 DIAGNOSIS — H5711 Ocular pain, right eye: Secondary | ICD-10-CM | POA: Diagnosis not present

## 2014-09-26 DIAGNOSIS — D638 Anemia in other chronic diseases classified elsewhere: Secondary | ICD-10-CM | POA: Diagnosis not present

## 2014-09-26 DIAGNOSIS — E1165 Type 2 diabetes mellitus with hyperglycemia: Secondary | ICD-10-CM

## 2014-09-26 LAB — BASIC METABOLIC PANEL
Anion gap: 9 (ref 5–15)
BUN: 35 mg/dL — ABNORMAL HIGH (ref 6–20)
CO2: 21 mmol/L — ABNORMAL LOW (ref 22–32)
Calcium: 8.6 mg/dL — ABNORMAL LOW (ref 8.9–10.3)
Chloride: 110 mmol/L (ref 101–111)
Creatinine, Ser: 3.68 mg/dL — ABNORMAL HIGH (ref 0.61–1.24)
GFR calc Af Amer: 20 mL/min — ABNORMAL LOW (ref >60)
GFR calc non Af Amer: 18 mL/min — ABNORMAL LOW (ref >60)
GLUCOSE: 129 mg/dL — AB (ref 65–99)
POTASSIUM: 4.5 mmol/L (ref 3.5–5.1)
Sodium: 140 mmol/L (ref 135–145)

## 2014-09-26 LAB — CBC
HCT: 31.2 % — ABNORMAL LOW (ref 39.0–52.0)
HEMOGLOBIN: 10.1 g/dL — AB (ref 13.0–17.0)
MCH: 30.2 pg (ref 26.0–34.0)
MCHC: 32.4 g/dL (ref 30.0–36.0)
MCV: 93.4 fL (ref 78.0–100.0)
Platelets: 195 10*3/uL (ref 150–400)
RBC: 3.34 MIL/uL — ABNORMAL LOW (ref 4.22–5.81)
RDW: 13.5 % (ref 11.5–15.5)
WBC: 6.4 10*3/uL (ref 4.0–10.5)

## 2014-09-26 LAB — GLUCOSE, CAPILLARY
GLUCOSE-CAPILLARY: 132 mg/dL — AB (ref 65–99)
GLUCOSE-CAPILLARY: 147 mg/dL — AB (ref 65–99)
GLUCOSE-CAPILLARY: 151 mg/dL — AB (ref 65–99)
Glucose-Capillary: 106 mg/dL — ABNORMAL HIGH (ref 65–99)

## 2014-09-26 MED ORDER — BISACODYL 10 MG RE SUPP
10.0000 mg | Freq: Every day | RECTAL | Status: DC | PRN
  Administered 2014-09-26: 10 mg via RECTAL
  Filled 2014-09-26: qty 1

## 2014-09-26 MED ORDER — DIPHENHYDRAMINE HCL 25 MG PO CAPS
25.0000 mg | ORAL_CAPSULE | Freq: Four times a day (QID) | ORAL | Status: DC | PRN
  Administered 2014-09-26: 25 mg via ORAL
  Filled 2014-09-26: qty 1

## 2014-09-26 NOTE — Progress Notes (Signed)
Patient ID: Cody Webster, male   DOB: [birth year], 49 y.o.   MRN: [id number] [facility] PROGRESS NOTE  Jhai Lovegrove [id number] DOB: 08-10-1965 DOA: 09/25/2014 PCP: Kerin Perna, NP  Brief narrative:    61 -year-old male with past medical history hypertension, chronic systolic and diastolic CHF (last 2-D echo in February 2016 with ejection fraction of 50% showing grade 1 diastolic dysfunction), uncontrolled diabetes who had right eye surgery one day prior to this admission for acute glaucoma at [facility]. Patient was not able to fill his prescription for eyedrops and because of the severe pain, nausea and vomiting from the uncontrolled pain he presented to [facility] long hospital. Please note the dad eye surgeon at [facility] was contacted at the time of the admission and recommended to check intraocular pressure which was 39. Recommendation was to continue eyedrops. He was admitted to stepdown unit because his systolic blood pressure ranged in 200s and he was found to have worsening renal function.  Barrier to discharge: Blood pressure is currently stable and we will transfer the patient to telemetry today. He still has right eye pain so we will continue to monitor for next 24 hours. Anticipated discharge 09/27/2014.  Assessment/Plan:    Principal Problem: Accelerated hypertension - Patient admitted to stepdown unit because initial systolic blood pressure in 200 range - Patient started on quite an extensive blood pressure medication regimen which includes Norvasc, losartan, carvedilol, labetalol and hydralazine. Please note these are all patient's medications which he is supposed to take at home. - Current blood pressure is stable. We'll transfer to telemetry floor today.  Active Problems:   Diabetes mellitus, uncontrolled with renal manifestations - Most recent A1c 10.7 in April 2016 indicating poor glycemic control. - Appreciate diabetic coordinator consultation  recommendations. - Continue current insulin regimen which is sliding scale insulin. Current CBG's: 147, 132.   Chronic kidney disease, stage IV - About 3 weeks ago patient's creatinine as high as 3.4. On this admission, creatinine is 3.97 and it is trending down. - Continue to monitor renal function. Patient will have to follow-up outpatient with nephrology.    Acute glaucoma of right eye - Continue current eyedrops - Continue pain management efforts     Anemia of chronic disease - Secondary to anemia of chronic kidney disease. - Hemoglobin is 10.1 this morning. - No current indications for transfusion. - Follow-up CBC tomorrow morning.   DVT Prophylaxis  - SCDs bilaterally   Code Status: Full.  Family Communication:  plan of care discussed with the patient Disposition Plan: Home 09/27/2014..   IV access:  Peripheral IV  Procedures and diagnostic studies:    No results found.  Medical Consultants:  None   Other Consultants:  None   IAnti-Infectives:   None    Trei Schoch, MD  [facility] Pager [phone]  Time spent in minutes: 25 minutes  If 7PM-7AM, please contact night-coverage [email or web address] Password [email or web address] 09/26/2014, 1:16 PM      HPI/Subjective: No acute overnight events. Patient reports pain in the right eye.  Objective: Filed Vitals:   09/26/14 0900 09/26/14 1000 09/26/14 1035 09/26/14 1056  BP: 176/85 155/73 141/77 148/71  Pulse: 86 88  88  Temp:    98.8 F (37.1 C)  TempSrc:    Oral  Resp: 17 12 17 18   Height:    6' (1.829 m)  Weight:      SpO2: 100% 100%  99%    Intake/Output Summary (Last 24 hours) at 09/26/14 1316 Last  data filed at 09/26/14 1000  Gross per 24 hour  Intake 1211.25 ml  Output   1100 ml  Net 111.25 ml    Exam:   General:  Pt is alert, follows commands appropriately, not in acute distress  Cardiovascular: Regular rate and rhythm, S1/S2 (+)  Respiratory: Clear to auscultation bilaterally, no wheezing,  no crackles, no rhonchi  Abdomen: Soft, non tender, non distended, bowel sounds present  Extremities: No edema, pulses DP and PT palpable bilaterally  Neuro: Grossly nonfocal  Data Reviewed: Basic Metabolic Panel:  Recent Labs Lab 09/25/14 0822 09/26/14 0340  NA 140 140  K 4.7 4.5  CL 105 110  CO2 24 21*  GLUCOSE 207* 129*  BUN 35* 35*  CREATININE 3.97* 3.68*  CALCIUM 9.4 8.6*   Liver Function Tests: No results for input(s): AST, ALT, ALKPHOS, BILITOT, PROT, ALBUMIN in the last 168 hours. No results for input(s): LIPASE, AMYLASE in the last 168 hours. No results for input(s): AMMONIA in the last 168 hours. CBC:  Recent Labs Lab 09/25/14 0822 09/26/14 0340  WBC 7.4 6.4  NEUTROABS 5.9  --   HGB 11.1* 10.1*  HCT 33.7* 31.2*  MCV 92.8 93.4  PLT 235 195   Cardiac Enzymes: No results for input(s): CKTOTAL, CKMB, CKMBINDEX, TROPONINI in the last 168 hours. BNP: Invalid input(s): POCBNP CBG:  Recent Labs Lab 09/25/14 1006 09/25/14 1628 09/25/14 2148 09/26/14 0756 09/26/14 1105  GLUCAP 173* 141* 141* 132* 147*    Recent Results (from the past 240 hour(s))  MRSA PCR Screening     Status: None   Collection Time: 09/25/14  4:00 PM  Result Value Ref Range Status   MRSA by PCR NEGATIVE NEGATIVE Final     Scheduled Meds: . amLODipine  10 mg Oral Daily  . brimonidine  1 drop Right Eye TID  . carvedilol  25 mg Oral Daily  . dorzolamide-timolol  1 drop Right Eye BID  . fluticasone  1 spray Each Nare Daily  . gabapentin  300 mg Oral TID  . hydrALAZINE  25 mg Oral 3 times per day  . hydrochlorothiazide  25 mg Oral Daily  . insulin aspart  0-9 Units Subcutaneous TID WC  . labetalol  300 mg Oral BID  . latanoprost  1 drop Right Eye QHS  . losartan  50 mg Oral Daily  . prednisoLONE acetate  1 drop Right Eye Q2H while awake  . prednisoLONE acetate  1 drop Right Eye Daily

## 2014-09-27 DIAGNOSIS — H409 Unspecified glaucoma: Secondary | ICD-10-CM | POA: Diagnosis not present

## 2014-09-27 DIAGNOSIS — N183 Chronic kidney disease, stage 3 (moderate): Secondary | ICD-10-CM

## 2014-09-27 DIAGNOSIS — H5711 Ocular pain, right eye: Secondary | ICD-10-CM | POA: Diagnosis not present

## 2014-09-27 DIAGNOSIS — I10 Essential (primary) hypertension: Secondary | ICD-10-CM | POA: Diagnosis not present

## 2014-09-27 LAB — GLUCOSE, CAPILLARY: GLUCOSE-CAPILLARY: 135 mg/dL — AB (ref 65–99)

## 2014-09-27 MED ORDER — HYDRALAZINE HCL 25 MG PO TABS: 25.0000 mg | ORAL_TABLET | Freq: Three times a day (TID) | ORAL | Status: DC

## 2014-09-27 MED ORDER — LABETALOL HCL 300 MG PO TABS: 300.0000 mg | ORAL_TABLET | Freq: Three times a day (TID) | ORAL | Status: DC

## 2014-09-27 MED ORDER — CALCIUM CARBONATE ANTACID 500 MG PO CHEW
1.0000 | CHEWABLE_TABLET | Freq: Two times a day (BID) | ORAL | Status: DC
  Filled 2014-09-27: qty 1

## 2014-09-27 MED ORDER — PANTOPRAZOLE SODIUM 40 MG PO TBEC
40.0000 mg | DELAYED_RELEASE_TABLET | Freq: Every day | ORAL | Status: DC
  Administered 2014-09-27: 40 mg via ORAL
  Filled 2014-09-27: qty 1

## 2014-09-27 NOTE — Discharge Summary (Signed)
Physician Discharge Summary  Cody Webster [id number] DOB: 06/01/1965 DOA: 09/25/2014  PCP: Kerin Perna, NP  Admit date: 09/25/2014 Discharge date: 09/27/2014  Recommendations for Outpatient Follow-up:  1. Please note we have held your Hctz because of worse renal function 2. We added hydralazine for better blood pressure control. We also increased labetalol to 3 x a day instead of 2 times a day 3. Follow up with PCP in 1 week after discharge to make sure symptoms are stable  Discharge Diagnoses:  Principal Problem:   Hypertensive urgency Active Problems:   Diabetes   HTN (hypertension)   CKD (chronic kidney disease), stage III   Acute glaucoma of right eye   Acute-on-chronic kidney injury    Discharge Condition: stable   Diet recommendation: as tolerated   History of present illness: 49 -year-old male with past medical history hypertension, chronic systolic and diastolic CHF (last 2-D echo in February 2016 with ejection fraction of 50% showing grade 1 diastolic dysfunction), uncontrolled diabetes who had right eye surgery one day prior to this admission for acute glaucoma at [facility]. Patient was not able to fill his prescription for eyedrops and because of the severe pain, nausea and vomiting from the uncontrolled pain he presented to [facility] long hospital. Please note the dad eye surgeon at [facility] was contacted at the time of the admission and recommended to check intraocular pressure which was 39. Recommendation was to continue eyedrops.  He was admitted to stepdown unit because his systolic blood pressure ranged in 200s and he was found to have worsening renal function.  Hospital Course:   Principal Problem: Accelerated hypertension - Patient admitted to stepdown unit because initial systolic blood pressure in 200 range - Current BP in 170's prior to him getting BP meds this am - He will continue all PO meds as per prior to admission except for HCTZ. Also,  added hydralazine 20 mg PO TID and we increased labetalol to TID instead of BID regimen.    Active Problems:  Diabetes mellitus, uncontrolled with renal manifestations - Most recent A1c 10.7 in April 2016 indicating poor glycemic control. - CBG's ok during this hospital stay, 151, 106, 135 so he can continue insulin reigmen as per prior to this admission  - Continue current insulin regimen which is sliding scale insulin. Current CBG's: 147, 132.  Chronic kidney disease, stage IV - About 3 weeks ago patient's creatinine as high as 3.4. On this admission, creatinine is 3.97 and it is trending down. Likely from Hctz. We placed it on hold and pt instructed to follow up with PCP to recheck kidney function.    Acute glaucoma of right eye - Continue current eyedrops - Continue pain management efforts; pt follows with pain management clinic    Anemia of chronic disease - Secondary to anemia of chronic kidney disease. - Hemoglobin is 10.1 - No reports of bleeding.    DVT Prophylaxis  - SCDs bilaterally during this hospital stay.    Code Status: Full.    IV access:  Peripheral IV  Procedures and diagnostic studies:   No results found.  Medical Consultants:  None   Other Consultants:  None   IAnti-Infectives:   None  Signed:  Leisa Lenz, MD  [facility] 09/27/2014, 10:20 AM  Pager #: [phone]  Time spent in minutes: less than 30 minutes  Discharge Exam: Filed Vitals:   09/27/14 0640  BP: 171/74  Pulse: 92  Temp: 98 F (36.7 C)  Resp: 16  Filed Vitals:   09/26/14 1507 09/26/14 2205 09/27/14 0550 09/27/14 0640  BP: 158/81 171/76 176/81 171/74  Pulse: 84 84 92 92  Temp: 98 F (36.7 C) 98.2 F (36.8 C) 97.8 F (36.6 C) 98 F (36.7 C)  TempSrc: Oral  Oral Oral  Resp: 18 18 16 16   Height:      Weight:      SpO2: 99% 97% 97% 97%    General: Pt is alert, follows commands appropriately, not in acute  distress Cardiovascular: Regular rate and rhythm, S1/S2 +, no murmurs Respiratory: Clear to auscultation bilaterally, no wheezing, no crackles, no rhonchi Abdominal: Soft, non tender, non distended, bowel sounds +, no guarding Extremities: no cyanosis, pulses palpable bilaterally DP and PT Neuro: Grossly nonfocal  Discharge Instructions  Discharge Instructions    Call MD for:  difficulty breathing, headache or visual disturbances    Complete by:  As directed      Call MD for:  persistant nausea and vomiting    Complete by:  As directed      Call MD for:  severe uncontrolled pain    Complete by:  As directed      Diet - low sodium heart healthy    Complete by:  As directed      Discharge instructions    Complete by:  As directed   1. Please note we have held your Hctz because of worse renal function 2. We added hydralazine for better blood pressure control. We also increased labetalol to 3 x a day instead of 2 times a day 3. Follow up with PCP in 1 week after discharge to make sure symptoms are stable     Increase activity slowly    Complete by:  As directed             Medication List    STOP taking these medications        DSS 100 MG Caps     hydrochlorothiazide 25 MG tablet  Commonly known as:  HYDRODIURIL     nicotine 14 mg/24hr patch  Commonly known as:  NICODERM CQ - dosed in mg/24 hours      TAKE these medications        acetaminophen 325 MG tablet  Commonly known as:  TYLENOL  Take 650 mg by mouth every 6 (six) hours as needed for moderate pain.     amLODipine 10 MG tablet  Commonly known as:  NORVASC  Take 1 tablet (10 mg total) by mouth daily.     brimonidine 0.15 % ophthalmic solution  Commonly known as:  ALPHAGAN  Place 1 drop into the right eye 3 (three) times daily.     carvedilol 25 MG tablet  Commonly known as:  COREG  Take 25 mg by mouth daily.     cetirizine 10 MG tablet  Commonly known as:  ZYRTEC  Take 10 mg by mouth daily.      dorzolamide-timolol 22.3-6.8 MG/ML ophthalmic solution  Commonly known as:  COSOPT  Place 1 drop into the right eye 2 (two) times daily.     fluticasone 50 MCG/ACT nasal spray  Commonly known as:  FLONASE  Place 1 spray into both nostrils daily.     gabapentin 300 MG capsule  Commonly known as:  NEURONTIN  Take 300 mg by mouth 3 (three) times daily.     hydrALAZINE 25 MG tablet  Commonly known as:  APRESOLINE  Take 1 tablet (25 mg total) by mouth every  8 (eight) hours.     labetalol 300 MG tablet  Commonly known as:  NORMODYNE  Take 1 tablet (300 mg total) by mouth 3 (three) times daily.     latanoprost 0.005 % ophthalmic solution  Commonly known as:  XALATAN  Place 1 drop into the right eye at bedtime.     losartan 50 MG tablet  Commonly known as:  COZAAR  Take 50 mg by mouth daily.     oxyCODONE-acetaminophen 5-325 MG per tablet  Commonly known as:  ROXICET  Take 1-2 tablets by mouth every 4 (four) hours as needed for severe pain.     prednisoLONE acetate 1 % ophthalmic suspension  Commonly known as:  PRED FORTE  Place 1 drop into the right eye daily.           Follow-up Information    Follow up with EDWARDS, MICHELLE P, NP. Schedule an appointment as soon as possible for a visit in 1 week.   Specialty:  Internal Medicine   Why:  Follow up appt after recent hospitalization   Contact information:   [address] [place] [address] [phone]        The results of significant diagnostics from this hospitalization (including imaging, microbiology, ancillary and laboratory) are listed below for reference.    Significant Diagnostic Studies: US Renal  09/08/2014   CLINICAL DATA:  Renal failure.  EXAM: RENAL / URINARY TRACT ULTRASOUND COMPLETE  COMPARISON:  December 11, 2013.  FINDINGS: Right Kidney:  Length: 11.2 cm. Increased echogenicity is noted suggesting medical renal disease. No mass or hydronephrosis visualized.  Left Kidney:  Length: 12.0 cm. Increased  echogenicity is noted suggesting medical renal disease. 3 cm cyst is seen in lower pole. No mass or hydronephrosis visualized.  Bladder:  Appears normal for degree of bladder distention.  IMPRESSION: Increased echogenicity of renal parenchyma is noted bilaterally suggesting medical renal disease. No hydronephrosis or renal obstruction is noted.   Electronically Signed   By: Marijo Conception, M.D.   On: 09/08/2014 15:02    Microbiology: Recent Results (from the past 240 hour(s))  MRSA PCR Screening     Status: None   Collection Time: 09/25/14  4:00 PM  Result Value Ref Range Status   MRSA by PCR NEGATIVE NEGATIVE Final    Comment:        The GeneXpert MRSA Assay (FDA approved for NASAL specimens only), is one component of a comprehensive MRSA colonization surveillance program. It is not intended to diagnose MRSA infection nor to guide or monitor treatment for MRSA infections.      Labs: Basic Metabolic Panel:  Recent Labs Lab 09/25/14 0822 09/26/14 0340  NA 140 140  K 4.7 4.5  CL 105 110  CO2 24 21*  GLUCOSE 207* 129*  BUN 35* 35*  CREATININE 3.97* 3.68*  CALCIUM 9.4 8.6*   Liver Function Tests: No results for input(s): AST, ALT, ALKPHOS, BILITOT, PROT, ALBUMIN in the last 168 hours. No results for input(s): LIPASE, AMYLASE in the last 168 hours. No results for input(s): AMMONIA in the last 168 hours. CBC:  Recent Labs Lab 09/25/14 0822 09/26/14 0340  WBC 7.4 6.4  NEUTROABS 5.9  --   HGB 11.1* 10.1*  HCT 33.7* 31.2*  MCV 92.8 93.4  PLT 235 195   Cardiac Enzymes: No results for input(s): CKTOTAL, CKMB, CKMBINDEX, TROPONINI in the last 168 hours. BNP: BNP (last 3 results) No results for input(s): BNP in the last 8760 hours.  ProBNP (last 3 results) No results for input(s): PROBNP in the last 8760 hours.  CBG:  Recent Labs Lab 09/26/14 0756 09/26/14 1105 09/26/14 1630 09/26/14 2119 09/27/14 0723  GLUCAP 132* 147* 151* 106* 135*

## 2014-09-27 NOTE — Discharge Instructions (Signed)

## 2015-01-11 ENCOUNTER — Emergency Department (HOSPITAL_COMMUNITY): Payer: Medicaid Other

## 2015-01-11 ENCOUNTER — Encounter (HOSPITAL_COMMUNITY): Payer: Self-pay | Admitting: *Deleted

## 2015-01-11 ENCOUNTER — Inpatient Hospital Stay (HOSPITAL_COMMUNITY)
Admission: EM | Admit: 2015-01-11 | Discharge: 2015-01-12 | DRG: 637 | Disposition: A | Discharge status: Home / Self Care | Payer: Medicaid Other | Attending: Internal Medicine | Admitting: Internal Medicine

## 2015-01-11 DIAGNOSIS — N183 Chronic kidney disease, stage 3 (moderate): Secondary | ICD-10-CM | POA: Diagnosis present

## 2015-01-11 DIAGNOSIS — E860 Dehydration: Secondary | ICD-10-CM | POA: Diagnosis present

## 2015-01-11 DIAGNOSIS — N185 Chronic kidney disease, stage 5: Secondary | ICD-10-CM | POA: Diagnosis present

## 2015-01-11 DIAGNOSIS — E1100 Type 2 diabetes mellitus with hyperosmolarity without nonketotic hyperglycemic-hyperosmolar coma (NKHHC): Principal | ICD-10-CM | POA: Diagnosis present

## 2015-01-11 DIAGNOSIS — F17210 Nicotine dependence, cigarettes, uncomplicated: Secondary | ICD-10-CM | POA: Diagnosis present

## 2015-01-11 DIAGNOSIS — I10 Essential (primary) hypertension: Secondary | ICD-10-CM

## 2015-01-11 DIAGNOSIS — H409 Unspecified glaucoma: Secondary | ICD-10-CM | POA: Diagnosis present

## 2015-01-11 DIAGNOSIS — Z794 Long term (current) use of insulin: Secondary | ICD-10-CM

## 2015-01-11 DIAGNOSIS — G928 Other toxic encephalopathy: Secondary | ICD-10-CM | POA: Diagnosis present

## 2015-01-11 DIAGNOSIS — T502X5A Adverse effect of carbonic-anhydrase inhibitors, benzothiadiazides and other diuretics, initial encounter: Secondary | ICD-10-CM | POA: Diagnosis present

## 2015-01-11 DIAGNOSIS — Z833 Family history of diabetes mellitus: Secondary | ICD-10-CM | POA: Diagnosis not present

## 2015-01-11 DIAGNOSIS — Z8249 Family history of ischemic heart disease and other diseases of the circulatory system: Secondary | ICD-10-CM | POA: Diagnosis not present

## 2015-01-11 DIAGNOSIS — E1122 Type 2 diabetes mellitus with diabetic chronic kidney disease: Secondary | ICD-10-CM | POA: Diagnosis present

## 2015-01-11 DIAGNOSIS — E872 Acidosis, unspecified: Secondary | ICD-10-CM

## 2015-01-11 DIAGNOSIS — IMO0002 Reserved for concepts with insufficient information to code with codable children: Secondary | ICD-10-CM | POA: Diagnosis present

## 2015-01-11 DIAGNOSIS — E119 Type 2 diabetes mellitus without complications: Secondary | ICD-10-CM | POA: Diagnosis present

## 2015-01-11 DIAGNOSIS — Z6829 Body mass index (BMI) 29.0-29.9, adult: Secondary | ICD-10-CM

## 2015-01-11 DIAGNOSIS — I5032 Chronic diastolic (congestive) heart failure: Secondary | ICD-10-CM | POA: Diagnosis not present

## 2015-01-11 DIAGNOSIS — E1101 Type 2 diabetes mellitus with hyperosmolarity with coma: Secondary | ICD-10-CM | POA: Diagnosis not present

## 2015-01-11 DIAGNOSIS — G9340 Encephalopathy, unspecified: Secondary | ICD-10-CM

## 2015-01-11 DIAGNOSIS — I5042 Chronic combined systolic (congestive) and diastolic (congestive) heart failure: Secondary | ICD-10-CM | POA: Diagnosis present

## 2015-01-11 DIAGNOSIS — G92 Toxic encephalopathy: Secondary | ICD-10-CM | POA: Diagnosis present

## 2015-01-11 DIAGNOSIS — R739 Hyperglycemia, unspecified: Secondary | ICD-10-CM | POA: Diagnosis present

## 2015-01-11 DIAGNOSIS — N179 Acute kidney failure, unspecified: Secondary | ICD-10-CM | POA: Diagnosis present

## 2015-01-11 DIAGNOSIS — I129 Hypertensive chronic kidney disease with stage 1 through stage 4 chronic kidney disease, or unspecified chronic kidney disease: Secondary | ICD-10-CM | POA: Diagnosis present

## 2015-01-11 DIAGNOSIS — N184 Chronic kidney disease, stage 4 (severe): Secondary | ICD-10-CM | POA: Diagnosis present

## 2015-01-11 DIAGNOSIS — E440 Moderate protein-calorie malnutrition: Secondary | ICD-10-CM | POA: Diagnosis present

## 2015-01-11 DIAGNOSIS — F17200 Nicotine dependence, unspecified, uncomplicated: Secondary | ICD-10-CM | POA: Diagnosis present

## 2015-01-11 DIAGNOSIS — I160 Hypertensive urgency: Secondary | ICD-10-CM | POA: Diagnosis present

## 2015-01-11 DIAGNOSIS — E1140 Type 2 diabetes mellitus with diabetic neuropathy, unspecified: Secondary | ICD-10-CM | POA: Diagnosis present

## 2015-01-11 DIAGNOSIS — E729 Disorder of amino-acid metabolism, unspecified: Secondary | ICD-10-CM | POA: Diagnosis present

## 2015-01-11 DIAGNOSIS — E1165 Type 2 diabetes mellitus with hyperglycemia: Secondary | ICD-10-CM

## 2015-01-11 LAB — URINALYSIS, ROUTINE W REFLEX MICROSCOPIC
BILIRUBIN URINE: NEGATIVE
Ketones, ur: NEGATIVE mg/dL
Leukocytes, UA: NEGATIVE
Nitrite: NEGATIVE
PROTEIN: 30 mg/dL — AB
Specific Gravity, Urine: 1.02 (ref 1.005–1.030)
Urobilinogen, UA: 0.2 mg/dL (ref 0.0–1.0)
pH: 7 (ref 5.0–8.0)

## 2015-01-11 LAB — COMPREHENSIVE METABOLIC PANEL
ALK PHOS: 362 U/L — AB (ref 38–126)
ALT: 43 U/L (ref 17–63)
ANION GAP: 12 (ref 5–15)
AST: 37 U/L (ref 15–41)
Albumin: 3.2 g/dL — ABNORMAL LOW (ref 3.5–5.0)
BILIRUBIN TOTAL: 1.3 mg/dL — AB (ref 0.3–1.2)
BUN: 39 mg/dL — ABNORMAL HIGH (ref 6–20)
CALCIUM: 8.6 mg/dL — AB (ref 8.9–10.3)
CO2: 27 mmol/L (ref 22–32)
CREATININE: 3.66 mg/dL — AB (ref 0.61–1.24)
Chloride: 80 mmol/L — ABNORMAL LOW (ref 101–111)
GFR, EST AFRICAN AMERICAN: 21 mL/min — AB (ref >60)
GFR, EST NON AFRICAN AMERICAN: 18 mL/min — AB (ref >60)
Glucose, Bld: 1063 mg/dL (ref 65–99)
Potassium: 4.6 mmol/L (ref 3.5–5.1)
Sodium: 119 mmol/L — CL (ref 135–145)
TOTAL PROTEIN: 7.7 g/dL (ref 6.5–8.1)

## 2015-01-11 LAB — CBG MONITORING, ED
Glucose-Capillary: >600 mg/dL (ref 65–99)
Glucose-Capillary: >600 mg/dL (ref 65–99)

## 2015-01-11 LAB — I-STAT CHEM 8, ED
BUN: 33 mg/dL — AB (ref 6–20)
CALCIUM ION: 1.09 mmol/L — AB (ref 1.12–1.23)
CHLORIDE: 89 mmol/L — AB (ref 101–111)
Creatinine, Ser: 3 mg/dL — ABNORMAL HIGH (ref 0.61–1.24)
Glucose, Bld: >700 mg/dL (ref 65–99)
HEMATOCRIT: 32 % — AB (ref 39.0–52.0)
Hemoglobin: 10.9 g/dL — ABNORMAL LOW (ref 13.0–17.0)
POTASSIUM: 3.8 mmol/L (ref 3.5–5.1)
SODIUM: 124 mmol/L — AB (ref 135–145)
TCO2: 24 mmol/L (ref 0–100)

## 2015-01-11 LAB — I-STAT CG4 LACTIC ACID, ED: Lactic Acid, Venous: 2.89 mmol/L (ref 0.5–2.0)

## 2015-01-11 LAB — GLUCOSE, CAPILLARY
GLUCOSE-CAPILLARY: 308 mg/dL — AB (ref 65–99)
GLUCOSE-CAPILLARY: 209 mg/dL — AB (ref 65–99)
GLUCOSE-CAPILLARY: 152 mg/dL — AB (ref 65–99)
Glucose-Capillary: 79 mg/dL (ref 65–99)

## 2015-01-11 LAB — BLOOD GAS, VENOUS
ACID-BASE DEFICIT: 0.2 mmol/L (ref 0.0–2.0)
Bicarbonate: 27 mEq/L — ABNORMAL HIGH (ref 20.0–24.0)
O2 CONTENT: 2 L/min
O2 SAT: 40.1 %
PATIENT TEMPERATURE: 98.6
PCO2 VEN: 56.4 mmHg — AB (ref 45.0–50.0)
TCO2: 24.5 mmol/L (ref 0–100)
pH, Ven: 7.302 — ABNORMAL HIGH (ref 7.250–7.300)

## 2015-01-11 LAB — MAGNESIUM: MAGNESIUM: 2.4 mg/dL (ref 1.7–2.4)

## 2015-01-11 LAB — CBC WITH DIFFERENTIAL/PLATELET
Basophils Absolute: 0 10*3/uL (ref 0.0–0.1)
Basophils Relative: 0 % (ref 0–1)
EOS ABS: 0.1 10*3/uL (ref 0.0–0.7)
EOS PCT: 1 % (ref 0–5)
HCT: 33 % — ABNORMAL LOW (ref 39.0–52.0)
HEMOGLOBIN: 11 g/dL — AB (ref 13.0–17.0)
LYMPHS PCT: 20 % (ref 12–46)
Lymphs Abs: 1.3 10*3/uL (ref 0.7–4.0)
MCH: 31.1 pg (ref 26.0–34.0)
MCHC: 33.3 g/dL (ref 30.0–36.0)
MCV: 93.2 fL (ref 78.0–100.0)
Monocytes Absolute: 0.4 10*3/uL (ref 0.1–1.0)
Monocytes Relative: 6 % (ref 3–12)
NEUTROS PCT: 73 % (ref 43–77)
Neutro Abs: 4.9 10*3/uL (ref 1.7–7.7)
Platelets: 255 10*3/uL (ref 150–400)
RBC: 3.54 MIL/uL — ABNORMAL LOW (ref 4.22–5.81)
RDW: 12.3 % (ref 11.5–15.5)
WBC: 6.7 10*3/uL (ref 4.0–10.5)

## 2015-01-11 LAB — URINE MICROSCOPIC-ADD ON

## 2015-01-11 LAB — I-STAT TROPONIN, ED: TROPONIN I, POC: 0.03 ng/mL (ref 0.00–0.08)

## 2015-01-11 LAB — PHOSPHORUS: Phosphorus: 3.2 mg/dL (ref 2.5–4.6)

## 2015-01-11 LAB — BETA-HYDROXYBUTYRIC ACID: Beta-Hydroxybutyric Acid: 0.08 mmol/L (ref 0.05–0.27)

## 2015-01-11 LAB — LACTIC ACID, PLASMA: Lactic Acid, Venous: 2.6 mmol/L (ref 0.5–2.0)

## 2015-01-11 MED ORDER — HYDRALAZINE HCL 20 MG/ML IJ SOLN
10.0000 mg | Freq: Four times a day (QID) | INTRAMUSCULAR | Status: DC | PRN
  Administered 2015-01-11: 10 mg via INTRAVENOUS
  Filled 2015-01-11 (×2): qty 1

## 2015-01-11 MED ORDER — PREDNISOLONE ACETATE 1 % OP SUSP
1.0000 [drp] | Freq: Every day | OPHTHALMIC | Status: DC
Start: 2015-01-11 — End: 2015-01-11

## 2015-01-11 MED ORDER — SODIUM CHLORIDE 0.9 % IV BOLUS (SEPSIS)
1000.0000 mL | Freq: Once | INTRAVENOUS | Status: AC
  Administered 2015-01-11: 1000 mL via INTRAVENOUS

## 2015-01-11 MED ORDER — ENOXAPARIN SODIUM 40 MG/0.4ML ~~LOC~~ SOLN
40.0000 mg | SUBCUTANEOUS | Status: DC
  Administered 2015-01-11: 40 mg via SUBCUTANEOUS
  Filled 2015-01-11: qty 0.4

## 2015-01-11 MED ORDER — SODIUM CHLORIDE 0.9 % IV SOLN
INTRAVENOUS | Status: DC
  Administered 2015-01-11 (×2): via INTRAVENOUS

## 2015-01-11 MED ORDER — SODIUM CHLORIDE 0.9 % IV SOLN
Freq: Once | INTRAVENOUS | Status: AC
  Administered 2015-01-11: 18:00:00 via INTRAVENOUS

## 2015-01-11 MED ORDER — ONDANSETRON HCL 4 MG PO TABS: 4.0000 mg | ORAL_TABLET | Freq: Four times a day (QID) | ORAL | Status: DC | PRN

## 2015-01-11 MED ORDER — DEXTROSE-NACL 5-0.45 % IV SOLN
INTRAVENOUS | Status: DC
  Administered 2015-01-11: 22:00:00 via INTRAVENOUS

## 2015-01-11 MED ORDER — SODIUM CHLORIDE 0.9 % IV SOLN
INTRAVENOUS | Status: DC
  Administered 2015-01-11: 5.4 [IU]/h via INTRAVENOUS
  Filled 2015-01-11: qty 2.5

## 2015-01-11 MED ORDER — SODIUM CHLORIDE 0.9 % IJ SOLN
3.0000 mL | Freq: Two times a day (BID) | INTRAMUSCULAR | Status: DC
  Administered 2015-01-11: 3 mL via INTRAVENOUS

## 2015-01-11 MED ORDER — SODIUM CHLORIDE 0.9 % IV BOLUS (SEPSIS)
500.0000 mL | Freq: Once | INTRAVENOUS | Status: AC
  Administered 2015-01-11: 500 mL via INTRAVENOUS

## 2015-01-11 MED ORDER — ACETAMINOPHEN 325 MG PO TABS
650.0000 mg | ORAL_TABLET | ORAL | Status: DC | PRN
  Administered 2015-01-12: 650 mg via ORAL
  Filled 2015-01-11: qty 2

## 2015-01-11 MED ORDER — DEXTROSE 50 % IV SOLN: 25.0000 mL | INTRAVENOUS | Status: DC | PRN

## 2015-01-11 MED ORDER — LATANOPROST 0.005 % OP SOLN
1.0000 [drp] | Freq: Every day | OPHTHALMIC | Status: DC
  Administered 2015-01-11: 1 [drp] via OPHTHALMIC
  Filled 2015-01-11: qty 2.5

## 2015-01-11 MED ORDER — DORZOLAMIDE HCL-TIMOLOL MAL 2-0.5 % OP SOLN
1.0000 [drp] | Freq: Two times a day (BID) | OPHTHALMIC | Status: DC
  Administered 2015-01-11 – 2015-01-12 (×2): 1 [drp] via OPHTHALMIC
  Filled 2015-01-11: qty 10

## 2015-01-11 MED ORDER — SODIUM CHLORIDE 0.9 % IV SOLN
INTRAVENOUS | Status: DC
  Administered 2015-01-11: 20:00:00 via INTRAVENOUS

## 2015-01-11 MED ORDER — BRIMONIDINE TARTRATE 0.15 % OP SOLN
1.0000 [drp] | Freq: Three times a day (TID) | OPHTHALMIC | Status: DC
  Administered 2015-01-11 – 2015-01-12 (×2): 1 [drp] via OPHTHALMIC
  Filled 2015-01-11: qty 5

## 2015-01-11 MED ORDER — ONDANSETRON HCL 4 MG/2ML IJ SOLN: 4.0000 mg | Freq: Four times a day (QID) | INTRAMUSCULAR | Status: DC | PRN

## 2015-01-11 NOTE — ED Notes (Signed)
Pt more alert, speech is clear. Pt does remember coming to the hospital and all of the events that occurred.  Insulin gtt initiated.

## 2015-01-11 NOTE — ED Notes (Signed)
Pt to CT

## 2015-01-11 NOTE — ED Notes (Signed)
Patient transported to CT 

## 2015-01-11 NOTE — ED Notes (Addendum)
Pt here due to slurred speech that became evident around 1445.  He states he has been "feeling funny" for a couple of days.  Pt is diabetic and CBG is greater than 600.  He has been nauseated and vomiting since last Tuesday per girlfriend.

## 2015-01-11 NOTE — ED Notes (Signed)
Dr Oleta Mouse made aware of abnormal lactic.

## 2015-01-11 NOTE — ED Notes (Signed)
Dr Oleta Mouse made aware of abnormal Glucose.

## 2015-01-11 NOTE — ED Notes (Signed)
Slurred speech - 1445 Current CBG greater than 600 in [facility]

## 2015-01-11 NOTE — H&P (Signed)
History and Physical  Cody Webster [id number] DOB: 10-29-1965 DOA: 01/11/2015  Referring physician: Brantley Stage, ER physician PCP: Kerin Perna, NP   Chief Complaint: Weakness and confusion  HPI: Cody Webster is a 49 y.o. male  With past medical history of diastolic heart failure, poorly controlled diabetes mellitus and hypertension who had been having episodes of hypoglycemia so when he saw his physician a month ago, was stopped on his insulin 70/30. It isn't clear why his dose was not lowered. He stated that he was supposed to follow-up with her in a few weeks, but had some other things going on and so did not follow-up. Reportedly from his roommate who brought the patient to the emergency room, over the last month he has had a steady decline. Patient has noted worsening blurry vision weakness and difficulty walking. Today he was brought in when he sounded lethargic and quite out of it. Patient states he normally takes his other medications including his blood pressure medicine except for today. When he came into the emergency room, patient's blood pressure was noted markedly elevated in the 200s with sugars that were higher than 1000. Patient started on insulin drip and sugars have since come down significantly. He was put on aggressive IV fluid rehydration. Lab work was noted for a sodium of 119, creatinine of 3.66,  lactic acid level of 2.89, alkaline phosphatase level of 362, and an ABG noting a respiratory and metabolic acidosis. He started to become more alert and interactive. Hospitalists were called for further evaluation    Review of Systems:  Patient seen in the emergency room. Pt complains of feeling very weak and tired, also complains of blurry vision for the last month because of high sugars. He has chronic lower extremity numbness.  Pt denies any headache, dysphagia, chest pain, palpitations, shortness of breath, wheeze, cough, abdominal pain, hematuria, dysuria,  constipation, diarrhea.  Review of systems are otherwise negative  Past Medical History  Diagnosis Date  . Hypertension   . Diabetes mellitus without complication   . CHF (congestive heart failure)    Past Surgical History  Procedure Laterality Date  . Toe amputation    . Right foot surgery     Social History:  reports that he has been smoking Cigarettes and Cigars.  He has a 15 pack-year smoking history. He quit smokeless tobacco use about 31 years ago. His smokeless tobacco use included Snuff and Chew. He reports that he drinks alcohol. He reports that he does not use illicit drugs. Patient lives at home with a roommate & is able to participate in activities of daily living with use of a walker  Allergies  Allergen Reactions  . Influenza Vaccines     Chest congestion, fever  . Pneumococcal Vaccines     Nausea, vomiting, fever  . Morphine And Related Other (See Comments)    Hallucination and shortness of breath    Family History  Problem Relation Age of Onset  . CAD Mother   . Diabetes type II Father   . Hypertension Sister   . CAD Sister   . Diabetes type II Brother       Prior to Admission medications   Medication Sig Start Date End Date Taking? Authorizing Provider  acetaminophen (TYLENOL) 325 MG tablet Take 650 mg by mouth every 6 (six) hours as needed for moderate pain.    Historical Provider, MD  amLODipine (NORVASC) 10 MG tablet Take 1 tablet (10 mg total) by mouth daily. 12/13/13  Eugenie Filler, MD  brimonidine (ALPHAGAN) 0.15 % ophthalmic solution Place 1 drop into the right eye 3 (three) times daily. 09/09/14   Costin Karlyne Greenspan, MD  carvedilol (COREG) 25 MG tablet Take 25 mg by mouth daily. 08/12/14   Historical Provider, MD  cetirizine (ZYRTEC) 10 MG tablet Take 10 mg by mouth daily. 08/19/14   Historical Provider, MD  dorzolamide-timolol (COSOPT) 22.3-6.8 MG/ML ophthalmic solution Place 1 drop into the right eye 2 (two) times daily. 09/09/14   Costin Karlyne Greenspan,  MD  fluticasone (FLONASE) 50 MCG/ACT nasal spray Place 1 spray into both nostrils daily. 08/19/14   Historical Provider, MD  gabapentin (NEURONTIN) 300 MG capsule Take 300 mg by mouth 3 (three) times daily. 08/22/14   Historical Provider, MD  hydrALAZINE (APRESOLINE) 25 MG tablet Take 1 tablet (25 mg total) by mouth every 8 (eight) hours. 09/27/14   Robbie Lis, MD  labetalol (NORMODYNE) 300 MG tablet Take 1 tablet (300 mg total) by mouth 3 (three) times daily. 09/27/14   Robbie Lis, MD  latanoprost (XALATAN) 0.005 % ophthalmic solution Place 1 drop into the right eye at bedtime. 09/09/14   Costin Karlyne Greenspan, MD  losartan (COZAAR) 50 MG tablet Take 50 mg by mouth daily. 08/12/14   Historical Provider, MD  oxyCODONE-acetaminophen (ROXICET) 5-325 MG per tablet Take 1-2 tablets by mouth every 4 (four) hours as needed for severe pain. 09/09/14   Costin Karlyne Greenspan, MD  prednisoLONE acetate (PRED FORTE) 1 % ophthalmic suspension Place 1 drop into the right eye daily. 09/23/14   Historical Provider, MD    Physical Exam: BP 189/91 mmHg  Pulse 87  Temp(Src) 98.2 F (36.8 C) (Oral)  Resp 21  SpO2 92%  General:  Alert and oriented 3, fatigue Eyes: Sclera nonicteric, extraocular movements appear intact ENT: Normocephalic, atraumatic, mucous murmurs are dry Neck: No carotid bruits Cardiovascular: Regular rhythm, borderline tachycardia, 2/6 systolic ejection murmur Respiratory: Clear to auscultation bilaterally Abdomen: Soft, nontender, nondistended, positive bowel sounds Skin: No skin breaks, tears or lesions Musculoskeletal: No clubbing or cyanosis or edema Psychiatric: Patient is appropriate, no evidence of psychoses Neurologic: Cranial nerves II through XII are intact. No dysmetria. No carotid bruits. Patient has normal grip symmetric with flexion and extension. Chronic peripheral neuropathy with decreased sensation           Labs on Admission:  Basic Metabolic Panel:  Recent Labs Lab  01/11/15 1540 01/11/15 1711  NA 119* 124*  K 4.6 3.8  CL 80* 89*  CO2 27  --   GLUCOSE 1063* >700*  BUN 39* 33*  CREATININE 3.66* 3.00*  CALCIUM 8.6*  --   MG 2.4  --   PHOS 3.2  --    Liver Function Tests:  Recent Labs Lab 01/11/15 1540  AST 37  ALT 43  ALKPHOS 362*  BILITOT 1.3*  PROT 7.7  ALBUMIN 3.2*   No results for input(s): LIPASE, AMYLASE in the last 168 hours. No results for input(s): AMMONIA in the last 168 hours. CBC:  Recent Labs Lab 01/11/15 1540 01/11/15 1711  WBC 6.7  --   NEUTROABS 4.9  --   HGB 11.0* 10.9*  HCT 33.0* 32.0*  MCV 93.2  --   PLT 255  --    Cardiac Enzymes: No results for input(s): CKTOTAL, CKMB, CKMBINDEX, TROPONINI in the last 168 hours.  BNP (last 3 results) No results for input(s): BNP in the last 8760 hours.  ProBNP (last 3 results) No  results for input(s): PROBNP in the last 8760 hours.  CBG:  Recent Labs Lab 01/11/15 1521 01/11/15 1757  GLUCAP >600* >600*    Radiological Exams on Admission: Dg Chest 2 View  01/11/2015   CLINICAL DATA:  Slurred speech beginning earlier today. altered mental status.  EXAM: CHEST  2 VIEW  COMPARISON:  None.  FINDINGS: The heart size and mediastinal contours are within normal limits. Both lungs are clear. The visualized skeletal structures are unremarkable.  IMPRESSION: No active cardiopulmonary disease.   Electronically Signed   By: Rolm Baptise M.D.   On: 01/11/2015 16:10   Ct Head Wo Contrast  01/11/2015   CLINICAL DATA:  Altered mental status, slurred speech, lethargy  EXAM: CT HEAD WITHOUT CONTRAST  TECHNIQUE: Contiguous axial images were obtained from the base of the skull through the vertex without intravenous contrast.  COMPARISON:  MR brain dated 07/12/2014.  FINDINGS: No evidence of parenchymal hemorrhage or extra-axial fluid collection. No mass lesion, mass effect, or midline shift.  No CT evidence of acute infarction.  Cerebral volume is within normal limits.  No  ventriculomegaly.  The visualized paranasal sinuses are essentially clear. Partial opacification of the right mastoid air cells.  No evidence of calvarial fracture.  IMPRESSION: Normal CT appearance of the brain.   Electronically Signed   By: Julian Hy M.D.   On: 01/11/2015 16:01    EKG: Independently reviewed. Sinus rhythm with left ventricular hypertrophy  Assessment/Plan Present on Admission:  . Tobacco use disorder: Counseled. Patient declines nicotine patch.  . Hypertensive urgency: Patient states he is usually good about taking his medication except for today when he was out of it. Need to rule out occult CVA before aggressively trying to get his pressure down. For now, when necessary hydralazine for systolic greater than 99991111  . Glaucoma: Restarted eyedrops.  Marland Kitchen acute kidney injury in the setting of CKD (chronic kidney disease), stage III : secondary to diuresis brought on by severe hyperglycemia. Aggressively hydrating.  difficult to ascertain patient's baseline which looks to run between 3-4 based on previous records.   Hyponatremia: This is actually pseudohyponatremia falsely lowered because of patient's hyperglycemia.   Elevated alkaline phosphatase level: Could possibly be from bone turnover from patient's increased immobilization over the past month. Recheck labs tomorrow morning.   . Chronic diastolic heart failure: We'll check daily weights plus strict input and output.  . Toxic metabolic encephalopathy: Secondary to hyperglycemia. Looks to be nearly resolving. Patient is more appropriate now with me.    . Hyperosmolar non-ketotic state in patient with type 2 diabetes mellitus with history of peripheral neuropathy: Holding by mouth medications. Insulin drip. IV fluids. Stepdown unit given acidosis. In concern that if he has been off of his insulin for over a month, was to triggering factor which may have been an occult event with the CVA.  Lactic acidosis: Doubt sepsis  and may be more likely to hypovolemia. Recheck lactic acid level.    Consultants: None  Code Status: Full code  Family Communication: No family present   Disposition Plan: Likely will be in hospital for several days until sugar stabilized  Time spent: 45 minutes  [facility] Pager [phone]

## 2015-01-11 NOTE — Progress Notes (Signed)
Called emergency room to get report on patient coming to 1231.  Waited on hold for over 5 minutes, and no one ever came to the phone.  Will await call from ED for report.  Senta Kantor Roselie Awkward RN

## 2015-01-11 NOTE — ED Provider Notes (Signed)
CSN: [id number]     Arrival date & time 01/11/15  1512 History   First MD Initiated Contact with Patient 01/11/15 1526     Chief Complaint  Patient presents with  . Stroke Symptoms     (Consider location/radiation/quality/duration/timing/severity/associated sxs/prior Treatment) HPI 49 year old male who presents with altered mental status and slurred speech. 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 H/o systolic HF with EF [id number]. According to family, he has been feeling unwell for 1-2 weeks, with fatigue, generalized weakness, and intermittent nausea and vomiting. Family saw him this way at 2:45pm today, but upon return 45 minutes later appeared more lethargic with slurred speech. Subsequently brought him to ED for evaluation. He has not had fever, abdominal pain, diarrhea, chest pain, cough, sob.  Past Medical History  Diagnosis Date  . Hypertension   . Diabetes mellitus without complication   . CHF (congestive heart failure)    Past Surgical History  Procedure Laterality Date  . Toe amputation    . Right foot surgery     Family History  Problem Relation Age of Onset  . CAD Mother   . Diabetes type II Father   . Hypertension Sister   . CAD Sister   . Diabetes type II Brother    Social History  Substance Use Topics  . Smoking status: Current Every Day Smoker -- 0.50 packs/day for 30 years    Types: Cigarettes, Cigars  . Smokeless tobacco: Former Systems developer    Types: Snuff, Chew    Quit date: 12/11/1983  . Alcohol Use: Yes     Comment: [email or web address]     Review of Systems 10/14 systems reviewed and are negative other than those stated in the HPI  Allergies  Influenza vaccines; Pneumococcal vaccines; and Morphine and related  Home Medications   Prior to Admission medications   Medication Sig Start Date End Date Taking? Authorizing Provider  acetaminophen (TYLENOL) 325 MG tablet Take 650 mg by mouth every 6 (six) hours as needed for moderate pain.    Historical Provider,  MD  amLODipine (NORVASC) 10 MG tablet Take 1 tablet (10 mg total) by mouth daily. 12/13/13   Eugenie Filler, MD  brimonidine (ALPHAGAN) 0.15 % ophthalmic solution Place 1 drop into the right eye 3 (three) times daily. 09/09/14   Costin Karlyne Greenspan, MD  carvedilol (COREG) 25 MG tablet Take 25 mg by mouth daily. 08/12/14   Historical Provider, MD  cetirizine (ZYRTEC) 10 MG tablet Take 10 mg by mouth daily. 08/19/14   Historical Provider, MD  dorzolamide-timolol (COSOPT) 22.3-6.8 MG/ML ophthalmic solution Place 1 drop into the right eye 2 (two) times daily. 09/09/14   Costin Karlyne Greenspan, MD  fluticasone (FLONASE) 50 MCG/ACT nasal spray Place 1 spray into both nostrils daily. 08/19/14   Historical Provider, MD  gabapentin (NEURONTIN) 300 MG capsule Take 300 mg by mouth 3 (three) times daily. 08/22/14   Historical Provider, MD  hydrALAZINE (APRESOLINE) 25 MG tablet Take 1 tablet (25 mg total) by mouth every 8 (eight) hours. 09/27/14   Robbie Lis, MD  labetalol (NORMODYNE) 300 MG tablet Take 1 tablet (300 mg total) by mouth 3 (three) times daily. 09/27/14   Robbie Lis, MD  latanoprost (XALATAN) 0.005 % ophthalmic solution Place 1 drop into the right eye at bedtime. 09/09/14   Costin Karlyne Greenspan, MD  losartan (COZAAR) 50 MG tablet Take 50 mg by mouth daily. 08/12/14   Historical Provider, MD  oxyCODONE-acetaminophen (ROXICET) 5-325 MG  per tablet Take 1-2 tablets by mouth every 4 (four) hours as needed for severe pain. 09/09/14   Costin Karlyne Greenspan, MD  prednisoLONE acetate (PRED FORTE) 1 % ophthalmic suspension Place 1 drop into the right eye daily. 09/23/14   Historical Provider, MD   BP 184/86 mmHg  Pulse 92  Temp(Src) 98.2 F (36.8 C) (Oral)  Resp 21  SpO2 97% Physical Exam Physical Exam  Nursing note and vitals reviewed. Constitutional: Well developed, well nourished, non-toxic, and in no acute distress, appears mildly somnolent but easily arouses to voice Head: Normocephalic and atraumatic.  Mouth/Throat:  Oropharynx is clear and mucous membranes are dry..  Neck: Normal range of motion. Neck supple.  Cardiovascular: Tachycardic rate and regular rhythm.  No edema Pulmonary/Chest: Effort normal and breath sounds normal.  Abdominal: Soft. There is no tenderness. There is no rebound and no guarding.  Musculoskeletal: Normal range of motion.  Neurological: Opens eyes to voice, moves all 4 extremities symmetrically to command, no facial droop,  Skin: Skin is warm and dry.  Psychiatric: Cooperative  ED Course  Procedures (including critical care time) Labs Review Labs Reviewed  CBC WITH DIFFERENTIAL/PLATELET - Abnormal; Notable for the following:    RBC 3.54 (*)    Hemoglobin 11.0 (*)    HCT 33.0 (*)    All other components within normal limits  COMPREHENSIVE METABOLIC PANEL - Abnormal; Notable for the following:    Sodium 119 (*)    Chloride 80 (*)    Glucose, Bld 1063 (*)    BUN 39 (*)    Creatinine, Ser 3.66 (*)    Calcium 8.6 (*)    Albumin 3.2 (*)    Alkaline Phosphatase 362 (*)    Total Bilirubin 1.3 (*)    GFR calc non Af Amer 18 (*)    GFR calc Af Amer 21 (*)    All other components within normal limits  BLOOD GAS, VENOUS - Abnormal; Notable for the following:    pH, Ven 7.302 (*)    pCO2, Ven 56.4 (*)    Bicarbonate 27.0 (*)    All other components within normal limits  CBG MONITORING, ED - Abnormal; Notable for the following:    Glucose-Capillary >600 (*)    All other components within normal limits  I-STAT CG4 LACTIC ACID, ED - Abnormal; Notable for the following:    Lactic Acid, Venous 2.89 (*)    All other components within normal limits  I-STAT CHEM 8, ED - Abnormal; Notable for the following:    Sodium 124 (*)    Chloride 89 (*)    BUN 33 (*)    Creatinine, Ser 3.00 (*)    Glucose, Bld >700 (*)    Calcium, Ion 1.09 (*)    Hemoglobin 10.9 (*)    HCT 32.0 (*)    All other components within normal limits  BETA-HYDROXYBUTYRIC ACID  MAGNESIUM  PHOSPHORUS   URINALYSIS, ROUTINE W REFLEX MICROSCOPIC (NOT AT [facility])  OSMOLALITY  Randolm Idol, ED    Imaging Review Dg Chest 2 View  01/11/2015   CLINICAL DATA:  Slurred speech beginning earlier today. altered mental status.  EXAM: CHEST  2 VIEW  COMPARISON:  None.  FINDINGS: The heart size and mediastinal contours are within normal limits. Both lungs are clear. The visualized skeletal structures are unremarkable.  IMPRESSION: No active cardiopulmonary disease.   Electronically Signed   By: Rolm Baptise M.D.   On: 01/11/2015 16:10   Ct Head Wo Contrast  01/11/2015   CLINICAL DATA:  Altered mental status, slurred speech, lethargy  EXAM: CT HEAD WITHOUT CONTRAST  TECHNIQUE: Contiguous axial images were obtained from the base of the skull through the vertex without intravenous contrast.  COMPARISON:  MR brain dated 07/12/2014.  FINDINGS: No evidence of parenchymal hemorrhage or extra-axial fluid collection. No mass lesion, mass effect, or midline shift.  No CT evidence of acute infarction.  Cerebral volume is within normal limits.  No ventriculomegaly.  The visualized paranasal sinuses are essentially clear. Partial opacification of the right mastoid air cells.  No evidence of calvarial fracture.  IMPRESSION: Normal CT appearance of the brain.   Electronically Signed   By: Julian Hy M.D.   On: 01/11/2015 16:01   I have personally reviewed and evaluated these images and lab results as part of my medical decision-making.   EKG Interpretation   Date/Time:  Sunday January 11 2015 15:27:11 EDT Ventricular Rate:  90 PR Interval:  164 QRS Duration: 87 QT Interval:  372 QTC Calculation: 455 R Axis:   19 Text Interpretation:  Sinus rhythm Left ventricular hypertrophy No  significant change since last tracing Confirmed by Nyjai Graff MD, Juancarlos Crescenzo [id number]) on  01/11/2015 5:21:13 PM       CRITICAL CARE Performed by: Forde Dandy   Total critical care time: 45 minutes  Critical care time was exclusive of  separately billable procedures and treating other patients.  Critical care was necessary to treat or prevent imminent or life-threatening deterioration.  Critical care was time spent personally by me on the following activities: management of hyperglycemic hyperosmolar nonketotic syndrome, management of hyperglycemia, development of treatment plan with patient and/or surrogate as well as nursing, discussions with consultants, evaluation of patient's response to treatment, examination of patient, obtaining history from patient or surrogate, ordering and performing treatments and interventions, ordering and review of laboratory studies, ordering and review of radiographic studies, pulse oximetry and re-evaluation of patient's condition.   MDM   Final diagnoses:  Hyperosmolar non-ketotic state in patient with type 2 diabetes mellitus  Hyperglycemia  Lactic acidosis    49 year old male with history of type 2 insulin-dependent diabetes and hypertension who presents with confusion and slurring of his speech. On arrival he was noted to have point-of-care glucose of over 600. He is mildly tachycardic but hemodynamically stable. I he is alert, slow in answering questions, but protecting his airway and grossly neurologically intact. Remainder of exam is nonfocal and unremarkable. Presentation consistent with hyperglycemic hyperosmolar nonketotic syndrome. No significant evidence of ketoacidosis on blood work. Glucose > 1000 on CMP. He is given 2 L of IV fluids, started on maintenance normal saline. He is also started on insulin drip. No evidence of concurrent infection at this time. EKG is nonischemic, and troponin is negative. He is discussed with [facility] hospitalist and admitted to stepdown for ongoing management.  Forde Dandy, MD 01/12/15 Shelah Lewandowsky

## 2015-01-11 NOTE — ED Notes (Signed)
Report called to [facility] - pt to be transferred to room 1231 ICU

## 2015-01-12 ENCOUNTER — Inpatient Hospital Stay (HOSPITAL_COMMUNITY): Payer: Medicaid Other

## 2015-01-12 DIAGNOSIS — E440 Moderate protein-calorie malnutrition: Secondary | ICD-10-CM | POA: Insufficient documentation

## 2015-01-12 DIAGNOSIS — E1140 Type 2 diabetes mellitus with diabetic neuropathy, unspecified: Secondary | ICD-10-CM

## 2015-01-12 DIAGNOSIS — E1165 Type 2 diabetes mellitus with hyperglycemia: Secondary | ICD-10-CM

## 2015-01-12 LAB — COMPREHENSIVE METABOLIC PANEL
ALBUMIN: 2.7 g/dL — AB (ref 3.5–5.0)
ALK PHOS: 259 U/L — AB (ref 38–126)
ALT: 32 U/L (ref 17–63)
ALT: 31 U/L (ref 17–63)
AST: 20 U/L (ref 15–41)
AST: 22 U/L (ref 15–41)
Albumin: 2.7 g/dL — ABNORMAL LOW (ref 3.5–5.0)
Alkaline Phosphatase: 247 U/L — ABNORMAL HIGH (ref 38–126)
Anion gap: 9 (ref 5–15)
BILIRUBIN TOTAL: 0.4 mg/dL (ref 0.3–1.2)
BILIRUBIN TOTAL: 0.2 mg/dL — AB (ref 0.3–1.2)
BUN: 27 mg/dL — ABNORMAL HIGH (ref 6–20)
BUN: 25 mg/dL — AB (ref 6–20)
CO2: 24 mmol/L (ref 22–32)
CO2: 24 mmol/L (ref 22–32)
Calcium: 8.3 mg/dL — ABNORMAL LOW (ref 8.9–10.3)
Calcium: 8.3 mg/dL — ABNORMAL LOW (ref 8.9–10.3)
Chloride: 102 mmol/L (ref 101–111)
Chloride: 102 mmol/L (ref 101–111)
Creatinine, Ser: 2.63 mg/dL — ABNORMAL HIGH (ref 0.61–1.24)
Creatinine, Ser: 2.65 mg/dL — ABNORMAL HIGH (ref 0.61–1.24)
GFR calc Af Amer: 30 mL/min — ABNORMAL LOW (ref >60)
GFR calc non Af Amer: 27 mL/min — ABNORMAL LOW (ref >60)
GFR, EST AFRICAN AMERICAN: 31 mL/min — AB (ref >60)
GFR, EST NON AFRICAN AMERICAN: 26 mL/min — AB (ref >60)
Glucose, Bld: 184 mg/dL — ABNORMAL HIGH (ref 65–99)
Glucose, Bld: 245 mg/dL — ABNORMAL HIGH (ref 65–99)
POTASSIUM: 3.3 mmol/L — AB (ref 3.5–5.1)
Sodium: 135 mmol/L (ref 135–145)
TOTAL PROTEIN: 6.2 g/dL — AB (ref 6.5–8.1)
Total Protein: 6.3 g/dL — ABNORMAL LOW (ref 6.5–8.1)

## 2015-01-12 LAB — CBC
HCT: 30.1 % — ABNORMAL LOW (ref 39.0–52.0)
Hemoglobin: 10.4 g/dL — ABNORMAL LOW (ref 13.0–17.0)
MCH: 30 pg (ref 26.0–34.0)
MCHC: 34.6 g/dL (ref 30.0–36.0)
MCV: 86.7 fL (ref 78.0–100.0)
Platelets: 235 10*3/uL (ref 150–400)
RBC: 3.47 MIL/uL — ABNORMAL LOW (ref 4.22–5.81)
RDW: 11.9 % (ref 11.5–15.5)
WBC: 7.2 10*3/uL (ref 4.0–10.5)

## 2015-01-12 LAB — OSMOLALITY: OSMOLALITY: 324 mosm/kg — AB (ref 275–300)

## 2015-01-12 LAB — GLUCOSE, CAPILLARY
GLUCOSE-CAPILLARY: 305 mg/dL — AB (ref 65–99)
Glucose-Capillary: 246 mg/dL — ABNORMAL HIGH (ref 65–99)
Glucose-Capillary: 76 mg/dL (ref 65–99)
Glucose-Capillary: 98 mg/dL (ref 65–99)

## 2015-01-12 LAB — MRSA PCR SCREENING: MRSA BY PCR: NEGATIVE

## 2015-01-12 MED ORDER — POLYVINYL ALCOHOL 1.4 % OP SOLN
1.0000 [drp] | Freq: Three times a day (TID) | OPHTHALMIC | Status: DC | PRN
  Filled 2015-01-12: qty 15

## 2015-01-12 MED ORDER — INSULIN ASPART 100 UNIT/ML ~~LOC~~ SOLN
0.0000 [IU] | Freq: Three times a day (TID) | SUBCUTANEOUS | Status: DC
  Administered 2015-01-12: 11 [IU] via SUBCUTANEOUS
  Administered 2015-01-12: 5 [IU] via SUBCUTANEOUS

## 2015-01-12 MED ORDER — HYDRALAZINE HCL 20 MG/ML IJ SOLN
10.0000 mg | Freq: Once | INTRAMUSCULAR | Status: AC
  Administered 2015-01-12: 10 mg via INTRAVENOUS

## 2015-01-12 MED ORDER — GABAPENTIN 300 MG PO CAPS
300.0000 mg | ORAL_CAPSULE | Freq: Once | ORAL | Status: AC
  Administered 2015-01-12: 300 mg via ORAL
  Filled 2015-01-12: qty 1

## 2015-01-12 MED ORDER — INSULIN GLARGINE 100 UNIT/ML ~~LOC~~ SOLN
10.0000 [IU] | Freq: Every day | SUBCUTANEOUS | Status: DC
  Administered 2015-01-12: 10 [IU] via SUBCUTANEOUS
  Filled 2015-01-12: qty 0.1

## 2015-01-12 MED ORDER — ADULT MULTIVITAMIN W/MINERALS CH: 1.0000 | ORAL_TABLET | Freq: Every day | ORAL | Status: DC

## 2015-01-12 MED ORDER — OXYCODONE-ACETAMINOPHEN 5-325 MG PO TABS
1.0000 | ORAL_TABLET | ORAL | Status: DC | PRN
  Administered 2015-01-12: 2 via ORAL
  Filled 2015-01-12: qty 2

## 2015-01-12 MED ORDER — GLUCERNA SHAKE PO LIQD
237.0000 mL | Freq: Three times a day (TID) | ORAL | Status: DC
  Administered 2015-01-12: 237 mL via ORAL
  Filled 2015-01-12 (×2): qty 237

## 2015-01-12 MED ORDER — CLONIDINE HCL 0.1 MG PO TABS
0.2000 mg | ORAL_TABLET | Freq: Two times a day (BID) | ORAL | Status: DC
Start: 2015-01-12 — End: 2015-01-12
  Administered 2015-01-12: 0.2 mg via ORAL
  Filled 2015-01-12: qty 2

## 2015-01-12 MED ORDER — HYPROMELLOSE (GONIOSCOPIC) 2.5 % OP SOLN
1.0000 [drp] | Freq: Three times a day (TID) | OPHTHALMIC | Status: DC | PRN
  Filled 2015-01-12: qty 15

## 2015-01-12 MED ORDER — GABAPENTIN 300 MG PO CAPS
300.0000 mg | ORAL_CAPSULE | Freq: Three times a day (TID) | ORAL | Status: DC
  Administered 2015-01-12: 300 mg via ORAL
  Filled 2015-01-12: qty 1

## 2015-01-12 MED ORDER — INSULIN ASPART PROT & ASPART (70-30 MIX) 100 UNIT/ML ~~LOC~~ SUSP
10.0000 [IU] | Freq: Two times a day (BID) | SUBCUTANEOUS | Status: DC
  Filled 2015-01-12: qty 10

## 2015-01-12 MED ORDER — INSULIN ASPART PROT & ASPART (70-30 MIX) 100 UNIT/ML PEN: PEN_INJECTOR | SUBCUTANEOUS | Status: DC

## 2015-01-12 MED ORDER — INSULIN GLARGINE 100 UNIT/ML ~~LOC~~ SOLN: 10.0000 [IU] | Freq: Every day | SUBCUTANEOUS | Status: DC

## 2015-01-12 NOTE — Progress Notes (Signed)
DC instructions reviewed with the pt and all questions and concerns were addressed. Pt has f/u appt already made and will have friends help get him to this appt. Pt is alert and oriented times 4, VSS, skin intact, no c/o pain, no s/s of distress or discomfort at this time. All personal belongings sent with the pt. Pt DC home.

## 2015-01-12 NOTE — Care Management Note (Signed)
Case Management Note  Patient Details  Name: Cody Webster MRN: [id number] Date of Birth: 1965-11-16  Subjective/Objective:                dka    Action/Plan:Date:  January 12, 2015 U.R. performed for needs and level of care. Will continue to follow for Case Management needs.  Velva Harman, RN, BSN, [place]   [phone]   Expected Discharge Date:                  Expected Discharge Plan:  Home/Self Care  In-House Referral:  NA  Discharge planning Services  CM Consult  Post Acute Care Choice:  NA Choice offered to:  NA  DME Arranged:    DME Agency:     HH Arranged:    [place] Agency:     Status of Service:  Completed, signed off  Medicare Important Message Given:    Date Medicare IM Given:    Medicare IM give by:    Date Additional Medicare IM Given:    Additional Medicare Important Message give by:     If discussed at [place] of Stay Meetings, dates discussed:    Additional Comments:  Leeroy Cha, RN 01/12/2015, 9:58 AM

## 2015-01-12 NOTE — Consult Note (Signed)
WOC wound consult note Reason for Consult:DFU left foot Wound type:Neuropathic Pressure Ulcer POA: No Measurement:1.5cm round in a 3cm round callous Wound bed:Red, moist.  Surrounding callous is white, soft. Drainage (amount, consistency, odor) scant serous to light yellow Periwound:Macerated Dressing procedure/placement/frequency:Patient has been treated in the past by Dr. Meridee Score and while he does not currently have an appointment, he is encouraged to today call and schedule one for continued paring of the DFU callus and topical wound care within the next two weeks.  In the meantime, patient is instructed to keep foot clean and dry, to change socks daily and to change them when damp. Patient and friend (who is in room to drive him home pending discharge) are in agreement with POC and understand consequences of leaving a DFU without proper care.  Risk of digit, forefoot and limb loss is understood.  Patient expresses appreciation for today's visit.  [facility] nursing team will not follow, but will remain available to this patient, the nursing and medical team.  Please re-consult if needed. Thanks, Maudie Flakes, MSN, RN, [place], [place], [place] [phone]

## 2015-01-12 NOTE — Progress Notes (Signed)
Initial Nutrition Assessment  DOCUMENTATION CODES:   Non-severe (moderate) malnutrition in context of acute illness/injury  INTERVENTION:   Provide Glucerna Shake po TID, each supplement provides 220 kcal and 10 grams of protein Multivitamin with minerals daily RD to continue to monitor  NUTRITION DIAGNOSIS:   Inadequate oral intake related to nausea, vomiting, poor appetite as evidenced by  (Poor PO intake x 2 weeks PTA.).  GOAL:   Patient will meet greater than or equal to 90% of their needs  MONITOR:   PO intake, Supplement acceptance, Labs, Weight trends, Skin, I & O's  REASON FOR ASSESSMENT:   Malnutrition Screening Tool    ASSESSMENT:   49 y.o. male With past medical history of diastolic heart failure, poorly controlled diabetes mellitus and hypertension who had been having episodes of hypoglycemia so when he saw his physician a month ago, was stopped on his insulin 70/30.   Pt reports for the past 2 weeks he has been unable to keep any food down d/t reflux issues. Pt denies any nausea today. Pt did eat breakfast this AM. Per weight history documentation, pt has lost 31 lb since 4/24 (12% weight loss x 4 months, significant for time frame). Pt willing to try Glucerna shakes to provide extra protein to aid in wound healing.  Nutrition focused physical exam shows no sign of depletion of muscle mass or body fat.  Labs reviewed: CBGs: 98-305 Low K Elevated BUN, Creatinine  Diet Order:  Diet Carb Modified Fluid consistency:: Thin; Room service appropriate?: Yes Diet - low sodium heart healthy  Skin:  Wound (see comment) (diabetic foot ulcer)  Last BM:  8/28  Height:   Ht Readings from Last 1 Encounters:  01/11/15 6' (1.829 m)    Weight:   Wt Readings from Last 1 Encounters:  01/12/15 218 lb 14.7 oz (99.3 kg)    Ideal Body Weight:  80.9 kg  BMI:  Body mass index is 29.68 kg/(m^2).  Estimated Nutritional Needs:   Kcal:  2100-2300  Protein:   120-130g  Fluid:  2.1L/day  EDUCATION NEEDS:   No education needs identified at this time  Clayton Bibles, MS, RD, LDN Pager: [phone] After Hours Pager: [phone]

## 2015-01-12 NOTE — Discharge Summary (Signed)
Discharge Summary  Cody Webster [id number] DOB: 22-Jun-1965  PCP: Kerin Perna, NP  Admit date: 01/11/2015 Discharge date: 01/12/2015  Time spent: 25 minutes  Recommendations for Outpatient Follow-up:  1.  New medication: Insulin 70/30 20 units in the morning and 12 units in the evening. 2. Patient will follow-up with his PCP, Juluis Mire nurse practitioner in the next 2 weeks  Discharge Diagnoses:  Active Hospital Problems   Diagnosis Date Noted  . Hyperosmolar non-ketotic state in patient with type 2 diabetes mellitus 01/11/2015  . Malnutrition of moderate degree 01/12/2015  . Chronic diastolic heart failure 123XX123  . Toxic metabolic encephalopathy 123XX123  . AKI (acute kidney injury) 01/11/2015  . DM type 2, uncontrolled, with neuropathy 01/11/2015  . Hyperglycemic hyperosmolar nonketotic coma 01/11/2015  . Non-ketotic hyperglycinemia, type II 01/11/2015  . Glaucoma   . Hypertensive urgency 09/25/2014  . Tobacco use disorder 09/06/2014  . CKD (chronic kidney disease), stage III 07/11/2014    Resolved Hospital Problems   Diagnosis Date Noted Date Resolved  No resolved problems to display.    Discharge Condition: Improved, being discharged home  Diet recommendation: Carb modified, low sodium  Filed Weights   01/11/15 2015 01/12/15 0550  Weight: 99.2 kg (218 lb 11.1 oz) 99.3 kg (218 lb 14.7 oz)    History of present illness:  49 year old male with past medical history of diabetes mellitus, chronic diastolic heart failure and stage III chronic kidney disease who saw his PCP 1 month ago and at that time had been having episodes of hyperglycemia so according to the patient, was told to stop his insulin altogether. (Patient was on insulin 70/30, 36 units in the morning, 14 units in the evening). According to patient's roommate who brought him into the emergency room for confusion on 8/29, patient had been steadily having worsening lethargy, blurry vision  and weakness over the past month since stopping insulin. When he came into the emergency room he was noted to have markedly elevated blood sugars greater than 700, slightly confused and quite dehydrated. Creatinine on admission was 3. Patient started on IV fluids and insulin drip and admitted to hospitalist service. Also noted to have elevated lactic acid level. Blood pressures were noted to be in the 200s.  Hospital Course:     Acute kidney injury in the setting of CKD (chronic kidney disease), stage III: With IV fluids, creatinine down to baseline at 2.65. Patient doing much better.    Tobacco use disorder: Patient counseled. He declined nicotine patch.    Hypertensive urgency: Patient states he was taking his medicines up until day of admission. With hydration actually pressures improved and patient started on home medications.  Given elevated but pressures and initial confusion, CT scan of head negative. MRI checked to rule out occult CVA which was negative.    Glaucoma: Continue on drops.   Chronic diastolic heart failure: Stable medical issue during this hospitalization.    Toxic metabolic encephalopathy: Secondary to market hypoglycemia and dehydration. Improved in the emergency room and no baseline when admitted. Patient mentating normally by time of discharge    DM type 2, uncontrolled, with neuropathy/nonketotic hyperglycemia: Patient initially on insulin drip. He was able to be weaned off by evening. Restarted on insulin and converted over to his 70/30 insulin. Advised the patient that by stopping his insulin altogether this with the results. He agreed and he was amenable to restarting at a lower dose. He will follow up with his PCP.    Malnutrition  of moderate degree: Patient meets criteria in the context of acute illness and injury. Seen by nutrition. On Glucerna shakes 3 times a day plus multivitamin   Procedures:  None  Consultations:  9  Discharge Exam: BP 148/81 mmHg   Pulse 95  Temp(Src) 98.6 F (37 C) (Oral)  Resp 14  Ht 6' (1.829 m)  Wt 99.3 kg (218 lb 14.7 oz)  BMI 29.68 kg/m2  SpO2 97%  General: Alert and oriented 3, no acute distress Cardiovascular: Regular rate and rhythm, S1-S2 Respiratory: Clear to auscultation bilaterally  Discharge Instructions You were cared for by a hospitalist during your hospital stay. If you have any questions about your discharge medications or the care you received while you were in the hospital after you are discharged, you can call the unit and asked to speak with the hospitalist on call if the hospitalist that took care of you is not available. Once you are discharged, your primary care physician will handle any further medical issues. Please note that NO REFILLS for any discharge medications will be authorized once you are discharged, as it is imperative that you return to your primary care physician (or establish a relationship with a primary care physician if you do not have one) for your aftercare needs so that they can reassess your need for medications and monitor your lab values.  Discharge Instructions    Diet - low sodium heart healthy    Complete by:  As directed      Increase activity slowly    Complete by:  As directed             Medication List    TAKE these medications        amLODipine 10 MG tablet  Commonly known as:  NORVASC  Take 1 tablet (10 mg total) by mouth daily.     carvedilol 25 MG tablet  Commonly known as:  COREG  Take 25 mg by mouth every morning.     cloNIDine 0.2 MG tablet  Commonly known as:  CATAPRES  Take 0.2 mg by mouth 2 (two) times daily.     dorzolamide-timolol 22.3-6.8 MG/ML ophthalmic solution  Commonly known as:  COSOPT  Place 1 drop into the right eye 2 (two) times daily.     gabapentin 300 MG capsule  Commonly known as:  NEURONTIN  Take 300 mg by mouth 3 (three) times daily.     hydrALAZINE 25 MG tablet  Commonly known as:  APRESOLINE  Take 1 tablet  (25 mg total) by mouth every 8 (eight) hours.     hydroxypropyl methylcellulose / hypromellose 2.5 % ophthalmic solution  Commonly known as:  ISOPTO TEARS / GONIOVISC  Place 1 drop into both eyes 3 (three) times daily as needed for dry eyes.     insulin aspart protamine - aspart (70-30) 100 UNIT/ML FlexPen  Commonly known as:  NOVOLOG 70/30 MIX  20 units in the morning & 12 units in the evening     ondansetron 4 MG tablet  Commonly known as:  ZOFRAN  take 1 tablet by mouth every 12 hours if needed     oxyCODONE-acetaminophen 5-325 MG per tablet  Commonly known as:  ROXICET  Take 1-2 tablets by mouth every 4 (four) hours as needed for severe pain.     oxyCODONE-acetaminophen 7.5-325 MG per tablet  Commonly known as:  PERCOCET  Take 1 tablet by mouth 4 (four) times daily.       Allergies  Allergen  Reactions  . Influenza Vaccines     Chest congestion, fever  . Pneumococcal Vaccines     Nausea, vomiting, fever  . Morphine And Related Other (See Comments)    Hallucination and shortness of breath       Follow-up Information    Follow up with EDWARDS, MICHELLE P, NP On 02/02/2015.   Specialty:  Internal Medicine   Why:  APPT. IS AT 1:30 PM. , AT POST HOSP. VISIT, CO-PAY, LIST OF MEDS, ARRIVE 15 PRIOR TO APPT, BRING INSURANCE CARD AND ID   Contact information:   [place] [address] [phone]        The results of significant diagnostics from this hospitalization (including imaging, microbiology, ancillary and laboratory) are listed below for reference.    Significant Diagnostic Studies: Dg Chest 2 View  01/11/2015   CLINICAL DATA:  Slurred speech beginning earlier today. altered mental status.  EXAM: CHEST  2 VIEW  COMPARISON:  None.  FINDINGS: The heart size and mediastinal contours are within normal limits. Both lungs are clear. The visualized skeletal structures are unremarkable.  IMPRESSION: No active cardiopulmonary disease.   Electronically Signed    By: Rolm Baptise M.D.   On: 01/11/2015 16:10   Ct Head Wo Contrast  01/11/2015   CLINICAL DATA:  Altered mental status, slurred speech, lethargy  EXAM: CT HEAD WITHOUT CONTRAST  TECHNIQUE: Contiguous axial images were obtained from the base of the skull through the vertex without intravenous contrast.  COMPARISON:  MR brain dated 07/12/2014.  FINDINGS: No evidence of parenchymal hemorrhage or extra-axial fluid collection. No mass lesion, mass effect, or midline shift.  No CT evidence of acute infarction.  Cerebral volume is within normal limits.  No ventriculomegaly.  The visualized paranasal sinuses are essentially clear. Partial opacification of the right mastoid air cells.  No evidence of calvarial fracture.  IMPRESSION: Normal CT appearance of the brain.   Electronically Signed   By: Julian Hy M.D.   On: 01/11/2015 16:01   Mr Brain Wo Contrast  01/12/2015   CLINICAL DATA:  Acute encephalopathy. Confusion, hyperglycemia, and elevated blood pressure. Severe head pressure. Stage III chronic renal insufficiency.  EXAM: MRI HEAD WITHOUT CONTRAST  TECHNIQUE: Multiplanar, multiecho pulse sequences of the brain and surrounding structures were obtained without intravenous contrast.  COMPARISON:  Head CT 01/11/2015 and MRI 07/12/2014  FINDINGS: The examination had to be discontinued prior to completion due to patient's mental status and inability to cooperate with further imaging. All routine noncontrast brain MRI sequences were obtained except for axial T1 and coronal T2 images.  There is no evidence of acute infarct, intracranial hemorrhage, mass, midline shift, or extra-axial fluid collection. Ventricles and sulci are normal. Minimal T2 hyperintensity in the periventricular white matter bilaterally is unchanged and nonspecific.  Orbits are unremarkable. Paranasal sinuses are clear. A chronic, moderate-sized right mastoid effusion is again seen. There is also a trace left mastoid effusion. Major  intracranial vascular flow voids are preserved.  IMPRESSION: Unchanged appearance of the brain without evidence of acute intracranial abnormality.   Electronically Signed   By: Logan Bores M.D.   On: 01/12/2015 08:17    Microbiology: Recent Results (from the past 240 hour(s))  MRSA PCR Screening     Status: None   Collection Time: 01/11/15 10:51 PM  Result Value Ref Range Status   MRSA by PCR NEGATIVE NEGATIVE Final    Comment:        The GeneXpert MRSA Assay (  FDA approved for NASAL specimens only), is one component of a comprehensive MRSA colonization surveillance program. It is not intended to diagnose MRSA infection nor to guide or monitor treatment for MRSA infections.      Labs: Basic Metabolic Panel:  Recent Labs Lab 01/11/15 1540 01/11/15 1711 01/12/15 0350 01/12/15 0520  NA 119* 124*  --  135  K 4.6 3.8  --  3.3*  CL 80* 89* 102 102  CO2 27  --  24 24  GLUCOSE 1063* >700* 184* 245*  BUN 39* 33* 27* 25*  CREATININE 3.66* 3.00* 2.63* 2.65*  CALCIUM 8.6*  --  8.3* 8.3*  MG 2.4  --   --   --   PHOS 3.2  --   --   --    Liver Function Tests:  Recent Labs Lab 01/11/15 1540 01/12/15 0350 01/12/15 0520  AST 37 20 22  ALT 43 32 31  ALKPHOS 362* 259* 247*  BILITOT 1.3* 0.4 0.2*  PROT 7.7 6.3* 6.2*  ALBUMIN 3.2* 2.7* 2.7*   No results for input(s): LIPASE, AMYLASE in the last 168 hours. No results for input(s): AMMONIA in the last 168 hours. CBC:  Recent Labs Lab 01/11/15 1540 01/11/15 1711 01/12/15 0520  WBC 6.7  --  7.2  NEUTROABS 4.9  --   --   HGB 11.0* 10.9* 10.4*  HCT 33.0* 32.0* 30.1*  MCV 93.2  --  86.7  PLT 255  --  235   Cardiac Enzymes: No results for input(s): CKTOTAL, CKMB, CKMBINDEX, TROPONINI in the last 168 hours. BNP: BNP (last 3 results) No results for input(s): BNP in the last 8760 hours.  ProBNP (last 3 results) No results for input(s): PROBNP in the last 8760 hours.  CBG:  Recent Labs Lab 01/11/15 2341  01/12/15 0044 01/12/15 0148 01/12/15 0808 01/12/15 1159  GLUCAP 79 76 98 246* 305*       Signed:  Jakaden Ouzts K  [facility] 01/12/2015, 5:54 PM

## 2015-03-11 ENCOUNTER — Other Ambulatory Visit: Payer: Self-pay | Admitting: Primary Care

## 2015-03-26 ENCOUNTER — Other Ambulatory Visit: Payer: Self-pay | Admitting: Primary Care

## 2015-03-26 DIAGNOSIS — E113513 Type 2 diabetes mellitus with proliferative diabetic retinopathy with macular edema, bilateral: Secondary | ICD-10-CM

## 2015-04-01 ENCOUNTER — Ambulatory Visit
Admission: RE | Admit: 2015-04-01 | Discharge: 2015-04-01 | Disposition: A | Discharge status: Home / Self Care | Payer: Medicaid Other | Source: Ambulatory Visit | Attending: Primary Care | Admitting: Primary Care

## 2015-04-01 DIAGNOSIS — E113513 Type 2 diabetes mellitus with proliferative diabetic retinopathy with macular edema, bilateral: Secondary | ICD-10-CM

## 2015-05-15 ENCOUNTER — Other Ambulatory Visit: Payer: Self-pay | Admitting: *Deleted

## 2015-05-15 DIAGNOSIS — I6522 Occlusion and stenosis of left carotid artery: Secondary | ICD-10-CM

## 2015-05-22 ENCOUNTER — Encounter: Payer: Self-pay | Admitting: Vascular Surgery

## 2015-05-27 ENCOUNTER — Encounter (HOSPITAL_COMMUNITY): Payer: Medicaid Other

## 2015-05-27 ENCOUNTER — Encounter: Payer: Self-pay | Admitting: Vascular Surgery

## 2015-05-27 ENCOUNTER — Ambulatory Visit (INDEPENDENT_AMBULATORY_CARE_PROVIDER_SITE_OTHER): Payer: Medicaid Other | Admitting: Vascular Surgery

## 2015-05-27 VITALS — BP 144/87 | HR 87 | Temp 99.1°F | Resp 18 | Ht 72.0 in | Wt 239.0 lb

## 2015-05-27 DIAGNOSIS — I6522 Occlusion and stenosis of left carotid artery: Secondary | ICD-10-CM

## 2015-05-27 DIAGNOSIS — Z716 Tobacco abuse counseling: Secondary | ICD-10-CM | POA: Diagnosis not present

## 2015-05-27 NOTE — Addendum Note (Signed)
Addended by: Dorthula Rue L on: 05/27/2015 03:12 PM   Modules accepted: Orders

## 2015-05-27 NOTE — Progress Notes (Signed)
Filed Vitals:   05/27/15 1342 05/27/15 1343 05/27/15 1346  BP: 152/83 145/86 144/87  Pulse: 87 87 87  Temp: 99.1 F (37.3 C)    Resp: 18    Height: 6' (1.829 m)    Weight: 239 lb (108.41 kg)    SpO2: 99%

## 2015-05-27 NOTE — Progress Notes (Signed)
[facility] [place] HISTORY AND PHYSICAL   History of Present Illness:  Patient is a 50 y.o. male who presents for evaluation of left carotid stenosis with Hollenhorst plaque. The patient has baseline right eye blindness. This is thought to be secondary to diabetes. He has had some decline in his vision on the left side as well.  He denies symptoms of TIA amaurosis or stroke.  Other medical problems include hypertension diabetes renal insufficiency congestive failure all of which are currently stable. He has had some chest congestion from a cold recently but states this is improving. He currently smokes 1/2-1 pack cigarettes per day. He is interested in quitting. Greater than 3 minutes today spent regarding smoking cessation counseling.  Past Medical History  Diagnosis Date  . Hypertension   . Diabetes mellitus without complication ([place])   . CHF (congestive heart failure) ([place])   . Carotid artery occlusion     Past Surgical History  Procedure Laterality Date  . Toe amputation    . Right foot surgery      Social History Social History  Substance Use Topics  . Smoking status: Current Every Day Smoker -- 0.50 packs/day for 30 years    Types: Cigarettes, Cigars  . Smokeless tobacco: Former Systems developer    Types: Snuff, Chew    Quit date: 12/11/1983  . Alcohol Use: Yes     Comment: [email or web address]     Family History Family History  Problem Relation Age of Onset  . CAD Mother   . Diabetes type II Father   . Hypertension Sister   . CAD Sister   . Diabetes type II Brother     Allergies  Allergies  Allergen Reactions  . Influenza Vaccines     Chest congestion, fever  . Pneumococcal Vaccines     Nausea, vomiting, fever  . Morphine And Related Other (See Comments)    Hallucination and shortness of breath     Current Outpatient Prescriptions  Medication Sig Dispense Refill  . cloNIDine (CATAPRES) 0.2 MG tablet Take 0.2 mg by mouth 2 (two) times daily.  0  .  gabapentin (NEURONTIN) 300 MG capsule Take 300 mg by mouth 3 (three) times daily.  0  . hydrALAZINE (APRESOLINE) 25 MG tablet Take 1 tablet (25 mg total) by mouth every 8 (eight) hours. (Patient taking differently: Take 50 mg by mouth 3 (three) times daily. ) 90 tablet 0  . hydrochlorothiazide (HYDRODIURIL) 25 MG tablet Take 25 mg by mouth daily.    . hydroxypropyl methylcellulose / hypromellose (ISOPTO TEARS / GONIOVISC) 2.5 % ophthalmic solution Place 1 drop into both eyes 3 (three) times daily as needed for dry eyes.    . insulin aspart protamine - aspart (NOVOLOG 70/30 MIX) (70-30) 100 UNIT/ML FlexPen 20 units in the morning & 12 units in the evening 15 mL 11  . ondansetron (ZOFRAN) 4 MG tablet take 1 tablet by mouth every 12 hours if needed  0  . PENTOXIFYLLINE ER PO Take 400 mg by mouth daily.    Marland Kitchen amLODipine (NORVASC) 10 MG tablet Take 1 tablet (10 mg total) by mouth daily. (Patient not taking: Reported on 05/27/2015) 30 tablet 0  . carvedilol (COREG) 25 MG tablet Take 25 mg by mouth every morning. Reported on 05/27/2015  0  . dorzolamide-timolol (COSOPT) 22.3-6.8 MG/ML ophthalmic solution Place 1 drop into the right eye 2 (two) times daily. (Patient not taking: Reported on 05/27/2015) 10 mL 2  . oxyCODONE-acetaminophen (PERCOCET) 7.5-325 MG  per tablet Take 1 tablet by mouth 4 (four) times daily. Reported on 05/27/2015  0  . oxyCODONE-acetaminophen (ROXICET) 5-325 MG per tablet Take 1-2 tablets by mouth every 4 (four) hours as needed for severe pain. (Patient not taking: Reported on 05/27/2015) 30 tablet 0   No current facility-administered medications for this visit.    ROS:   General:  No weight loss, Fever, chills  HEENT: No recent headaches, no nasal bleeding, no visual changes, no sore throat  Neurologic: No dizziness, blackouts, seizures. No recent symptoms of stroke or mini- stroke. No recent episodes of slurred speech, or temporary blindness.  Cardiac: No recent episodes of chest  pain/pressure, no shortness of breath at rest.  No shortness of breath with exertion.  Denies history of atrial fibrillation or irregular heartbeat  Vascular: No history of rest pain in feet.  No history of claudication.  No history of non-healing ulcer, No history of DVT   Pulmonary: No home oxygen, no productive cough, no hemoptysis,  No asthma or wheezing  Musculoskeletal:  [ ]  Arthritis, [ ]  Low back pain,  [ ]  Joint pain  Hematologic:No history of hypercoagulable state.  No history of easy bleeding.  No history of anemia  Gastrointestinal: No hematochezia or melena,  No gastroesophageal reflux, no trouble swallowing  Urinary: [x ] chronic Kidney disease, [ ]  on HD - [ ]  MWF or [ ]  TTHS, [ ]  Burning with urination, [ ]  Frequent urination, [ ]  Difficulty urinating;   Skin: No rashes  Psychological: No history of anxiety,  No history of depression   Physical Examination  Filed Vitals:   05/27/15 1342 05/27/15 1343 05/27/15 1346  BP: 152/83 145/86 144/87  Pulse: 87 87 87  Temp: 99.1 F (37.3 C)    Resp: 18    Height: 6' (1.829 m)    Weight: 239 lb (108.41 kg)    SpO2: 99%      Body mass index is 32.41 kg/(m^2).  General:  Alert and oriented, no acute distress HEENT: Normal Neck: No bruit or JVD Pulmonary: Clear to auscultation bilaterally Cardiac: Regular Rate and Rhythm without murmur Abdomen: Soft, non-tender, non-distended, no mass Skin: No rash Extremity Pulses:  2+ radial, brachial, femoral, absent popliteal dorsalis pedis, posterior tibial pulses bilaterally Musculoskeletal: No deformity or edema  Neurologic: Upper and lower extremity motor 5/5 and symmetric  DATA:  Patient had a carotid duplex scanning [facility] imaging dated 04/01/2015. This suggested greater than 70% left internal carotid artery stenosis. However velocities were only 269/13 which I would suggest is probably less than 60% stenosis. Right side had no significant stenosis.   ASSESSMENT:   Moderate asymptomatic left internal carotid artery stenosis. This is not high-grade enough to consider carotid endarterectomy at this point.   PLAN:  #1 continued risk factor modification as including smoking cessation blood pressure management cholesterol management and diabetes management and antiplatelet therapy in the form of aspirin. The patient will have a follow-up carotid duplex scan in 6 months time. He'll return sooner if he develops any symptoms of TIA amaurosis or stroke  Ruta Hinds, MD [facility] [place]: [phone] Pager: [place], MD [facility] [place]: [phone] Pager: [phone]

## 2015-05-28 ENCOUNTER — Encounter: Payer: Self-pay | Admitting: Primary Care

## 2015-09-29 ENCOUNTER — Encounter: Payer: Self-pay | Admitting: Primary Care

## 2015-11-20 ENCOUNTER — Encounter: Payer: Self-pay | Admitting: Family

## 2015-11-26 ENCOUNTER — Ambulatory Visit: Payer: Medicaid Other | Admitting: Family

## 2015-11-26 ENCOUNTER — Ambulatory Visit (HOSPITAL_COMMUNITY): Payer: Medicaid Other | Attending: Vascular Surgery

## 2016-02-24 ENCOUNTER — Ambulatory Visit (INDEPENDENT_AMBULATORY_CARE_PROVIDER_SITE_OTHER): Payer: Self-pay | Admitting: Family

## 2016-03-04 ENCOUNTER — Ambulatory Visit (INDEPENDENT_AMBULATORY_CARE_PROVIDER_SITE_OTHER): Payer: Medicaid Other | Admitting: Family

## 2016-03-04 DIAGNOSIS — E1142 Type 2 diabetes mellitus with diabetic polyneuropathy: Secondary | ICD-10-CM

## 2016-03-04 DIAGNOSIS — L97522 Non-pressure chronic ulcer of other part of left foot with fat layer exposed: Secondary | ICD-10-CM

## 2016-04-04 ENCOUNTER — Ambulatory Visit (INDEPENDENT_AMBULATORY_CARE_PROVIDER_SITE_OTHER): Payer: Medicaid Other | Admitting: Orthopedic Surgery

## 2016-04-15 ENCOUNTER — Ambulatory Visit (INDEPENDENT_AMBULATORY_CARE_PROVIDER_SITE_OTHER): Payer: Medicaid Other | Admitting: Family

## 2016-04-15 ENCOUNTER — Encounter (INDEPENDENT_AMBULATORY_CARE_PROVIDER_SITE_OTHER): Payer: Self-pay | Admitting: Family

## 2016-04-15 ENCOUNTER — Encounter (INDEPENDENT_AMBULATORY_CARE_PROVIDER_SITE_OTHER): Payer: Self-pay

## 2016-04-15 DIAGNOSIS — L97523 Non-pressure chronic ulcer of other part of left foot with necrosis of muscle: Secondary | ICD-10-CM

## 2016-04-15 DIAGNOSIS — E10621 Type 1 diabetes mellitus with foot ulcer: Secondary | ICD-10-CM

## 2016-04-15 NOTE — Progress Notes (Signed)
Office Visit Note   Patient: Cody Webster           Date of Birth: 19-Sep-1965           MRN: [id number] Visit Date: 04/15/2016              Requested by: Kerin Perna, NP [place], [place] [id number] PCP: Kerin Perna, NP   Assessment & Plan: Visit Diagnoses:  1. 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 Do not soak wound, discontinue peroxide. Continue Darco shoe with ambulation. Have emphasized the importance of nonweightbearing. Has crutches at home and states that he will use these.  Follow-Up Instructions: Return in about 2 weeks (around 04/29/2016).   Orders:  No orders of the defined types were placed in this encounter.  No orders of the defined types were placed in this encounter.     Procedures: No procedures performed   Clinical Data: No additional findings.   Subjective: Chief Complaint  Patient presents with  . Left Foot - Wound Check    Wagner grade I ulcer, left foot fourth and fifth metatarsal heads    Patient is a 50 year old gentleman seen today as a work in for concern of bone exposure in chronic diabetic ulcer. He was soaking his foot in soap and water and peroxide trying to clean up on Wednesday and noticed what he believes to be the bone sticking out. He denies any nausea or vomiting. He states he does have the chills on a regular basis. He has bloody drainage. He is doing dry dressing changes.    Wound Check     Review of Systems  Constitutional: Negative for chills and fever.     Objective: Vital Signs: There were no vitals taken for this visit.  Physical Exam  Constitutional: He is oriented to person, place, and time. He appears well-developed and well-nourished.  Pulmonary/Chest: Effort normal.  Neurological: He is alert and oriented to person, place, and time.  Skin:    There is a one cm in diameter ulceration beneath the fourth metatarsal head. This is 4 mm deep. Does not probe to bone. There is some macerated tissue down the wound. This is what the patient was bone. No surrounding erythema and no drainage no sign of infection. The surrounding callus of the diameter of 2 cm was pared with a 10 blade knife. Back to viable tissue. Silver nitrate was used for hemostasis.  Psychiatric: He has a normal mood and affect.  Nursing note reviewed.   Ortho Exam  Specialty Comments:  No specialty comments available.  Imaging: No results found.   PMFS History: Patient Active Problem List   Diagnosis Date Noted  . Malnutrition of moderate degree ([place]) 01/12/2015  . Chronic diastolic heart failure ([place]) 01/11/2015  . Toxic metabolic encephalopathy 60/63/0160  . AKI (acute kidney injury) ([place]) 01/11/2015  . DM type 2, uncontrolled, with neuropathy ([place]) 01/11/2015  . Hyperosmolar non-ketotic state in patient with type 2 diabetes mellitus ([place]) 01/11/2015  . Hyperglycemic hyperosmolar nonketotic coma ([place]) 01/11/2015  . Non-ketotic hyperglycinemia, type II ([place]) 01/11/2015  . Glaucoma   . Hypertensive urgency 09/25/2014  . Eye pain   . Hypoglycemia 09/06/2014  . Acute glaucoma of right eye 09/06/2014  . Acute-on-chronic kidney injury ([place]) 09/06/2014  . Type I diabetes mellitus with complication, uncontrolled (  [place]) 09/06/2014  . Anemia 09/06/2014  . Tobacco use disorder 09/06/2014  . Frequent headaches 07/11/2014  . Hyperglycemia 07/11/2014  . DM type 2 causing CKD stage 2 ([place]) 07/11/2014  . CKD (chronic kidney disease), stage III 07/11/2014  . CHF (congestive heart failure) ([place]) 07/11/2014  . Hyperglycemia due to type 2 diabetes mellitus ([place]) 07/11/2014  . Ataxia 07/11/2014  . CKD (chronic kidney disease) 12/11/2013  . Diabetic foot ulcer ([place]) 12/10/2013  . Diabetes ([place]) 12/10/2013  . HTN (hypertension) 12/10/2013  . Peripheral neuropathy ([place])  12/10/2013   Past Medical History:  Diagnosis Date  . Carotid artery occlusion   . CHF (congestive heart failure) ([place])   . Diabetes mellitus without complication ([place])   . Hypertension     Family History  Problem Relation Age of Onset  . CAD Mother   . Diabetes type II Father   . Hypertension Sister   . CAD Sister   . Diabetes type II Brother     Past Surgical History:  Procedure Laterality Date  . right foot surgery    . TOE AMPUTATION     Social History   Occupational History  . Not on file.   Social History Main Topics  . Smoking status: Current Every Day Smoker    Packs/day: 0.50    Years: 30.00    Types: Cigarettes, Cigars  . Smokeless tobacco: Former Systems developer    Types: Snuff, Chew    Quit date: 12/11/1983  . Alcohol use Yes     Comment: ocassaionlly   . Drug use: No  . Sexual activity: Not on file

## 2016-04-23 ENCOUNTER — Emergency Department (HOSPITAL_COMMUNITY): Payer: Medicaid Other

## 2016-04-23 ENCOUNTER — Inpatient Hospital Stay (HOSPITAL_COMMUNITY): Payer: Medicaid Other

## 2016-04-23 ENCOUNTER — Inpatient Hospital Stay (HOSPITAL_COMMUNITY)
Admission: EM | Admit: 2016-04-23 | Discharge: 2016-04-27 | DRG: 194 | Disposition: A | Discharge status: Home / Self Care | Payer: Medicaid Other | Attending: Internal Medicine | Admitting: Internal Medicine

## 2016-04-23 ENCOUNTER — Encounter (HOSPITAL_COMMUNITY): Payer: Self-pay | Admitting: Emergency Medicine

## 2016-04-23 DIAGNOSIS — E11621 Type 2 diabetes mellitus with foot ulcer: Secondary | ICD-10-CM | POA: Diagnosis present

## 2016-04-23 DIAGNOSIS — B379 Candidiasis, unspecified: Secondary | ICD-10-CM | POA: Diagnosis present

## 2016-04-23 DIAGNOSIS — E1022 Type 1 diabetes mellitus with diabetic chronic kidney disease: Secondary | ICD-10-CM | POA: Diagnosis present

## 2016-04-23 DIAGNOSIS — L97509 Non-pressure chronic ulcer of other part of unspecified foot with unspecified severity: Secondary | ICD-10-CM

## 2016-04-23 DIAGNOSIS — H409 Unspecified glaucoma: Secondary | ICD-10-CM | POA: Diagnosis present

## 2016-04-23 DIAGNOSIS — I5032 Chronic diastolic (congestive) heart failure: Secondary | ICD-10-CM | POA: Diagnosis present

## 2016-04-23 DIAGNOSIS — E1165 Type 2 diabetes mellitus with hyperglycemia: Secondary | ICD-10-CM

## 2016-04-23 DIAGNOSIS — E1040 Type 1 diabetes mellitus with diabetic neuropathy, unspecified: Secondary | ICD-10-CM | POA: Diagnosis present

## 2016-04-23 DIAGNOSIS — E108 Type 1 diabetes mellitus with unspecified complications: Secondary | ICD-10-CM | POA: Diagnosis not present

## 2016-04-23 DIAGNOSIS — E1065 Type 1 diabetes mellitus with hyperglycemia: Secondary | ICD-10-CM | POA: Diagnosis present

## 2016-04-23 DIAGNOSIS — L97529 Non-pressure chronic ulcer of other part of left foot with unspecified severity: Secondary | ICD-10-CM | POA: Diagnosis present

## 2016-04-23 DIAGNOSIS — F17210 Nicotine dependence, cigarettes, uncomplicated: Secondary | ICD-10-CM | POA: Diagnosis present

## 2016-04-23 DIAGNOSIS — Z887 Allergy status to serum and vaccine status: Secondary | ICD-10-CM

## 2016-04-23 DIAGNOSIS — R079 Chest pain, unspecified: Secondary | ICD-10-CM | POA: Diagnosis not present

## 2016-04-23 DIAGNOSIS — I130 Hypertensive heart and chronic kidney disease with heart failure and stage 1 through stage 4 chronic kidney disease, or unspecified chronic kidney disease: Secondary | ICD-10-CM | POA: Diagnosis present

## 2016-04-23 DIAGNOSIS — Z8249 Family history of ischemic heart disease and other diseases of the circulatory system: Secondary | ICD-10-CM

## 2016-04-23 DIAGNOSIS — I10 Essential (primary) hypertension: Secondary | ICD-10-CM | POA: Diagnosis not present

## 2016-04-23 DIAGNOSIS — Z885 Allergy status to narcotic agent status: Secondary | ICD-10-CM

## 2016-04-23 DIAGNOSIS — J181 Lobar pneumonia, unspecified organism: Secondary | ICD-10-CM | POA: Diagnosis not present

## 2016-04-23 DIAGNOSIS — IMO0002 Reserved for concepts with insufficient information to code with codable children: Secondary | ICD-10-CM

## 2016-04-23 DIAGNOSIS — J189 Pneumonia, unspecified organism: Secondary | ICD-10-CM

## 2016-04-23 DIAGNOSIS — N179 Acute kidney failure, unspecified: Secondary | ICD-10-CM | POA: Diagnosis present

## 2016-04-23 DIAGNOSIS — E1140 Type 2 diabetes mellitus with diabetic neuropathy, unspecified: Secondary | ICD-10-CM

## 2016-04-23 DIAGNOSIS — L97501 Non-pressure chronic ulcer of other part of unspecified foot limited to breakdown of skin: Secondary | ICD-10-CM

## 2016-04-23 DIAGNOSIS — E10621 Type 1 diabetes mellitus with foot ulcer: Secondary | ICD-10-CM | POA: Diagnosis present

## 2016-04-23 DIAGNOSIS — R748 Abnormal levels of other serum enzymes: Secondary | ICD-10-CM

## 2016-04-23 DIAGNOSIS — K219 Gastro-esophageal reflux disease without esophagitis: Secondary | ICD-10-CM | POA: Diagnosis present

## 2016-04-23 DIAGNOSIS — N183 Chronic kidney disease, stage 3 (moderate): Secondary | ICD-10-CM | POA: Diagnosis present

## 2016-04-23 DIAGNOSIS — E08621 Diabetes mellitus due to underlying condition with foot ulcer: Secondary | ICD-10-CM

## 2016-04-23 DIAGNOSIS — Z833 Family history of diabetes mellitus: Secondary | ICD-10-CM | POA: Diagnosis not present

## 2016-04-23 DIAGNOSIS — J154 Pneumonia due to other streptococci: Secondary | ICD-10-CM | POA: Diagnosis not present

## 2016-04-23 DIAGNOSIS — G629 Polyneuropathy, unspecified: Secondary | ICD-10-CM | POA: Diagnosis present

## 2016-04-23 DIAGNOSIS — N189 Chronic kidney disease, unspecified: Secondary | ICD-10-CM

## 2016-04-23 LAB — BASIC METABOLIC PANEL
ANION GAP: 8 (ref 5–15)
BUN: 35 mg/dL — ABNORMAL HIGH (ref 6–20)
CHLORIDE: 105 mmol/L (ref 101–111)
CO2: 21 mmol/L — ABNORMAL LOW (ref 22–32)
Calcium: 8 mg/dL — ABNORMAL LOW (ref 8.9–10.3)
Creatinine, Ser: 4.24 mg/dL — ABNORMAL HIGH (ref 0.61–1.24)
GFR calc Af Amer: 17 mL/min — ABNORMAL LOW (ref >60)
GFR calc non Af Amer: 15 mL/min — ABNORMAL LOW (ref >60)
Glucose, Bld: 197 mg/dL — ABNORMAL HIGH (ref 65–99)
POTASSIUM: 4 mmol/L (ref 3.5–5.1)
Sodium: 134 mmol/L — ABNORMAL LOW (ref 135–145)

## 2016-04-23 LAB — URINALYSIS, ROUTINE W REFLEX MICROSCOPIC
Bilirubin Urine: NEGATIVE
GLUCOSE, UA: 50 mg/dL — AB
Ketones, ur: NEGATIVE mg/dL
LEUKOCYTES UA: NEGATIVE
NITRITE: NEGATIVE
Specific Gravity, Urine: 1.012 (ref 1.005–1.030)
pH: 5 (ref 5.0–8.0)

## 2016-04-23 LAB — CBC WITH DIFFERENTIAL/PLATELET
BASOS ABS: 0 10*3/uL (ref 0.0–0.1)
BASOS PCT: 1 %
Eosinophils Absolute: 0.3 10*3/uL (ref 0.0–0.7)
Eosinophils Relative: 8 %
HEMATOCRIT: 34.9 % — AB (ref 39.0–52.0)
Hemoglobin: 11.6 g/dL — ABNORMAL LOW (ref 13.0–17.0)
Lymphocytes Relative: 34 %
Lymphs Abs: 1.3 10*3/uL (ref 0.7–4.0)
MCH: 31.5 pg (ref 26.0–34.0)
MCHC: 33.2 g/dL (ref 30.0–36.0)
MCV: 94.8 fL (ref 78.0–100.0)
Monocytes Absolute: 0.3 10*3/uL (ref 0.1–1.0)
Monocytes Relative: 8 %
NEUTROS ABS: 1.9 10*3/uL (ref 1.7–7.7)
Neutrophils Relative %: 49 %
Platelets: 160 10*3/uL (ref 150–400)
RBC: 3.68 MIL/uL — ABNORMAL LOW (ref 4.22–5.81)
RDW: 12.7 % (ref 11.5–15.5)
WBC: 3.8 10*3/uL — ABNORMAL LOW (ref 4.0–10.5)

## 2016-04-23 LAB — I-STAT TROPONIN, ED: Troponin i, poc: 0.04 ng/mL (ref 0.00–0.08)

## 2016-04-23 LAB — CBG MONITORING, ED: GLUCOSE-CAPILLARY: 153 mg/dL — AB (ref 65–99)

## 2016-04-23 LAB — GLUCOSE, CAPILLARY: Glucose-Capillary: 178 mg/dL — ABNORMAL HIGH (ref 65–99)

## 2016-04-23 LAB — BRAIN NATRIURETIC PEPTIDE: B Natriuretic Peptide: 81.7 pg/mL (ref 0.0–100.0)

## 2016-04-23 LAB — INFLUENZA PANEL BY PCR (TYPE A & B)
INFLBPCR: NEGATIVE
Influenza A By PCR: NEGATIVE

## 2016-04-23 LAB — I-STAT CG4 LACTIC ACID, ED: LACTIC ACID, VENOUS: 1.27 mmol/L (ref 0.5–1.9)

## 2016-04-23 MED ORDER — SODIUM CHLORIDE 0.9 % IV SOLN
INTRAVENOUS | Status: DC
  Administered 2016-04-23 – 2016-04-27 (×6): via INTRAVENOUS

## 2016-04-23 MED ORDER — DEXTROSE 5 % IV SOLN
500.0000 mg | INTRAVENOUS | Status: DC
  Administered 2016-04-24 – 2016-04-26 (×3): 500 mg via INTRAVENOUS
  Filled 2016-04-23 (×3): qty 500

## 2016-04-23 MED ORDER — CARVEDILOL 25 MG PO TABS
25.0000 mg | ORAL_TABLET | Freq: Two times a day (BID) | ORAL | Status: DC
  Administered 2016-04-24 – 2016-04-27 (×7): 25 mg via ORAL
  Filled 2016-04-23 (×7): qty 1

## 2016-04-23 MED ORDER — GABAPENTIN 300 MG PO CAPS
300.0000 mg | ORAL_CAPSULE | Freq: Three times a day (TID) | ORAL | Status: DC
  Administered 2016-04-23 – 2016-04-26 (×10): 300 mg via ORAL
  Filled 2016-04-23 (×10): qty 1

## 2016-04-23 MED ORDER — ACETAMINOPHEN 325 MG PO TABS
650.0000 mg | ORAL_TABLET | ORAL | Status: DC | PRN
Start: 2016-04-23 — End: 2016-04-27

## 2016-04-23 MED ORDER — POLYVINYL ALCOHOL 1.4 % OP SOLN
1.0000 [drp] | Freq: Three times a day (TID) | OPHTHALMIC | Status: DC | PRN
  Filled 2016-04-23: qty 15

## 2016-04-23 MED ORDER — HYDROMORPHONE HCL 1 MG/ML IJ SOLN
1.0000 mg | INTRAMUSCULAR | Status: DC | PRN
  Administered 2016-04-23 – 2016-04-27 (×15): 1 mg via INTRAVENOUS
  Filled 2016-04-23 (×13): qty 1

## 2016-04-23 MED ORDER — DEXTROSE 5 % IV SOLN
1.0000 g | INTRAVENOUS | Status: DC
  Administered 2016-04-24 – 2016-04-26 (×3): 1 g via INTRAVENOUS
  Filled 2016-04-23 (×3): qty 10

## 2016-04-23 MED ORDER — DEXTROSE 5 % IV SOLN
500.0000 mg | Freq: Once | INTRAVENOUS | Status: AC
  Administered 2016-04-23: 500 mg via INTRAVENOUS
  Filled 2016-04-23: qty 500

## 2016-04-23 MED ORDER — HYDROCODONE-ACETAMINOPHEN 10-325 MG PO TABS
1.0000 | ORAL_TABLET | Freq: Two times a day (BID) | ORAL | Status: DC | PRN
  Administered 2016-04-23 – 2016-04-26 (×4): 1 via ORAL
  Filled 2016-04-23 (×6): qty 1

## 2016-04-23 MED ORDER — DEXTROSE 5 % IV SOLN
1.0000 g | Freq: Once | INTRAVENOUS | Status: AC
  Administered 2016-04-23: 1 g via INTRAVENOUS
  Filled 2016-04-23: qty 10

## 2016-04-23 MED ORDER — HYDRALAZINE HCL 50 MG PO TABS
50.0000 mg | ORAL_TABLET | Freq: Three times a day (TID) | ORAL | Status: DC
Start: 2016-04-23 — End: 2016-04-27
  Administered 2016-04-23 – 2016-04-27 (×11): 50 mg via ORAL
  Filled 2016-04-23 (×11): qty 1

## 2016-04-23 MED ORDER — ENOXAPARIN SODIUM 30 MG/0.3ML ~~LOC~~ SOLN
30.0000 mg | SUBCUTANEOUS | Status: DC
  Administered 2016-04-23 – 2016-04-26 (×4): 30 mg via SUBCUTANEOUS
  Filled 2016-04-23 (×4): qty 0.3

## 2016-04-23 MED ORDER — TRAMADOL HCL 50 MG PO TABS
100.0000 mg | ORAL_TABLET | Freq: Four times a day (QID) | ORAL | Status: DC | PRN
  Administered 2016-04-23: 100 mg via ORAL
  Filled 2016-04-23: qty 2

## 2016-04-23 MED ORDER — CLONIDINE HCL 0.2 MG PO TABS
0.2000 mg | ORAL_TABLET | Freq: Two times a day (BID) | ORAL | Status: DC
  Administered 2016-04-23 – 2016-04-27 (×8): 0.2 mg via ORAL
  Filled 2016-04-23 (×8): qty 1

## 2016-04-23 MED ORDER — INSULIN ASPART 100 UNIT/ML ~~LOC~~ SOLN
0.0000 [IU] | Freq: Three times a day (TID) | SUBCUTANEOUS | Status: DC
  Administered 2016-04-24 (×2): 2 [IU] via SUBCUTANEOUS
  Administered 2016-04-25: 1 [IU] via SUBCUTANEOUS
  Administered 2016-04-25: 2 [IU] via SUBCUTANEOUS
  Administered 2016-04-25: 1 [IU] via SUBCUTANEOUS
  Administered 2016-04-26: 2 [IU] via SUBCUTANEOUS
  Administered 2016-04-26 (×2): 1 [IU] via SUBCUTANEOUS
  Administered 2016-04-27: 2 [IU] via SUBCUTANEOUS
  Administered 2016-04-27: 1 [IU] via SUBCUTANEOUS

## 2016-04-23 MED ORDER — INSULIN ASPART 100 UNIT/ML ~~LOC~~ SOLN: 0.0000 [IU] | Freq: Every day | SUBCUTANEOUS | Status: DC

## 2016-04-23 NOTE — H&P (Signed)
History and Physical    Cody Webster DVV:[id number] DOB: 16-Jul-1965 DOA: 04/23/2016  PCP: Kerin Perna, NP  Patient coming from: Home.   Chief Complaint: sob, chest pain, fever and chills.   HPI: Cody Webster is a 50 y.o. male with medical history significant of hypertension, stage 3 CKD, diabetes mellitus, chf, came in for sob, cough, fever and chills for 4 to 5 days. On arrival to ED, he was found to be afebrile, wbc count around 3.8. CXR reveals right sided pneumonia. He was referred to medical service for admission for management of CAP.  Further lab work revealed hemoglobin of 11.6. Sodium of 134. creatinins 4.24, BUN of 35. Lactic acid of 1.27. Troponin 0.04. BNP of 81.   Review of Systems: As per HPI otherwise 10 point review of systems negative.    Past Medical History:  Diagnosis Date  . Carotid artery occlusion   . CHF (congestive heart failure) ([place])   . Diabetes mellitus without complication ([place])   . Hypertension     Past Surgical History:  Procedure Laterality Date  . right foot surgery    . TOE AMPUTATION       reports that he has been smoking Cigarettes and Cigars.  He has a 15.00 pack-year smoking history. He quit smokeless tobacco use about 32 years ago. His smokeless tobacco use included Snuff and Chew. He reports that he drinks alcohol. He reports that he does not use drugs.  Allergies  Allergen Reactions  . Influenza Vaccines     Chest congestion, fever  . Morphine And Related Shortness Of Breath and Other (See Comments)    Hallucination and shortness of breath Tolerates Norco/Vicodin  . Pneumococcal Vaccines     Nausea, vomiting, fever    Family History  Problem Relation Age of Onset  . CAD Mother   . Diabetes type II Father   . Hypertension Sister   . CAD Sister   . Diabetes type II Brother    Reviewed.   Prior to Admission medications   Medication Sig Start Date End Date Taking? Authorizing Provider  carvedilol (COREG) 25 MG  tablet Take 25 mg by mouth every morning. Reported on 05/27/2015 08/12/14  Yes Historical Provider, MD  cloNIDine (CATAPRES) 0.2 MG tablet Take 0.2 mg by mouth 2 (two) times daily. 11/26/14  Yes Historical Provider, MD  gabapentin (NEURONTIN) 300 MG capsule Take 300 mg by mouth 3 (three) times daily. 08/22/14  Yes Historical Provider, MD  hydrALAZINE (APRESOLINE) 25 MG tablet Take 1 tablet (25 mg total) by mouth every 8 (eight) hours. Patient taking differently: Take 50 mg by mouth 3 (three) times daily.  09/27/14  Yes Robbie Lis, MD  hydrochlorothiazide (HYDRODIURIL) 25 MG tablet Take 25 mg by mouth daily.   Yes Historical Provider, MD  HYDROcodone-acetaminophen (NORCO) 10-325 MG tablet Take 1 tablet by mouth 2 (two) times daily as needed for moderate pain.   Yes Historical Provider, MD  hydroxypropyl methylcellulose / hypromellose (ISOPTO TEARS / GONIOVISC) 2.5 % ophthalmic solution Place 1 drop into both eyes 3 (three) times daily as needed for dry eyes.   Yes Historical Provider, MD  insulin aspart protamine - aspart (NOVOLOG 70/30 MIX) (70-30) 100 UNIT/ML FlexPen 20 units in the morning & 12 units in the evening 01/12/15  Yes Annita Brod, MD  metFORMIN (GLUCOPHAGE) 500 MG tablet Take 500 mg by mouth 2 (two) times daily as needed. Check blood sugar and if over 130 take the metformin  Yes Historical Provider, MD    Physical Exam: Vitals:   04/23/16 1700 04/23/16 1715 04/23/16 1730 04/23/16 1900  BP:    155/86  Pulse:    88  Resp: 23 23 20 24   Temp:      TempSrc:      SpO2: 92% 93% 96% 91%      Constitutional: NAD, calm, comfortable Vitals:   04/23/16 1700 04/23/16 1715 04/23/16 1730 04/23/16 1900  BP:    155/86  Pulse:    88  Resp: 23 23 20 24   Temp:      TempSrc:      SpO2: 92% 93% 96% 91%   Eyes: PERRL, lids and conjunctivae normal ENMT: Mucous membranes are moist. Posterior pharynx clear of any exudate or lesions.Normal dentition.  Neck: normal, supple, no masses, no  thyromegaly Respiratory: clear to auscultation bilaterally, no wheezing, no crackles. Normal respiratory effort. No accessory muscle use.  Cardiovascular: Regular rate and rhythm, no murmurs / rubs / gallops. No extremity edema. 2+ pedal pulses. No carotid bruits.  Abdomen: no tenderness, no masses palpated. No hepatosplenomegaly. Bowel sounds positive.  Musculoskeletal: no clubbing / cyanosis. No joint deformity upper and lower extremities. Good ROM, no contractures. Normal muscle tone.  Skin: left foot ulcer on the plantar aspect about 2 cm in diameter and about 1 to 2 cm depth.  Neurologic: CN 2-12 grossly intact. Sensation intact, DTR normal. Strength 5/5 in all 4.  Psychiatric: Normal judgment and insight. Alert and oriented x 3. Normal mood.    Labs on Admission: I have personally reviewed following labs and imaging studies  CBC:  Recent Labs Lab 04/23/16 1640  WBC 3.8*  NEUTROABS 1.9  HGB 11.6*  HCT 34.9*  MCV 94.8  PLT 539   Basic Metabolic Panel:  Recent Labs Lab 04/23/16 1500  NA 134*  K 4.0  CL 105  CO2 21*  GLUCOSE 197*  BUN 35*  CREATININE 4.24*  CALCIUM 8.0*   GFR: CrCl cannot be calculated (Unknown ideal weight.). Liver Function Tests: No results for input(s): AST, ALT, ALKPHOS, BILITOT, PROT, ALBUMIN in the last 168 hours. No results for input(s): LIPASE, AMYLASE in the last 168 hours. No results for input(s): AMMONIA in the last 168 hours. Coagulation Profile: No results for input(s): INR, PROTIME in the last 168 hours. Cardiac Enzymes: No results for input(s): CKTOTAL, CKMB, CKMBINDEX, TROPONINI in the last 168 hours. BNP (last 3 results) No results for input(s): PROBNP in the last 8760 hours. HbA1C: No results for input(s): HGBA1C in the last 72 hours. CBG:  Recent Labs Lab 04/23/16 1817  GLUCAP 153*   Lipid Profile: No results for input(s): CHOL, HDL, LDLCALC, TRIG, CHOLHDL, LDLDIRECT in the last 72 hours. Thyroid Function Tests: No  results for input(s): TSH, T4TOTAL, FREET4, T3FREE, THYROIDAB in the last 72 hours. Anemia Panel: No results for input(s): VITAMINB12, FOLATE, FERRITIN, TIBC, IRON, RETICCTPCT in the last 72 hours. Urine analysis:    Component Value Date/Time   COLORURINE YELLOW 01/11/2015 1740   APPEARANCEUR CLEAR 01/11/2015 1740   LABSPEC 1.020 01/11/2015 1740   PHURINE 7.0 01/11/2015 1740   GLUCOSEU >1000 (A) 01/11/2015 1740   HGBUR SMALL (A) 01/11/2015 1740   BILIRUBINUR NEGATIVE 01/11/2015 1740   KETONESUR NEGATIVE 01/11/2015 1740   PROTEINUR 30 (A) 01/11/2015 1740   UROBILINOGEN 0.2 01/11/2015 1740   NITRITE NEGATIVE 01/11/2015 1740   LEUKOCYTESUR NEGATIVE 01/11/2015 1740   Sepsis Labs: !!!!!!!!!!!!!!!!!!!!!!!!!!!!!!!!!!!!!!!!!!!! @LABRCNTIP (procalcitonin:4,lacticidven:4) )No results found for this or any previous  visit (from the past 240 hour(s)).   Radiological Exams on Admission: Dg Chest 2 View  Result Date: 04/23/2016 CLINICAL DATA:  Congestive cough. EXAM: CHEST  2 VIEW COMPARISON:  January 03, 2015 FINDINGS: Infiltrate is seen in the right mid and lower lung in a perihilar distribution. No other interval changes or acute abnormalities. IMPRESSION: Right-sided pneumonia.  Recommend follow-up to resolution. Electronically Signed   By: Dorise Bullion III M.D   On: 04/23/2016 14:48   Dg Foot Complete Left  Result Date: 04/23/2016 CLINICAL DATA:  Ulcer on bottom of foot, at the level of MTP joints x years per pt; EXAM: LEFT FOOT - COMPLETE 3+ VIEW COMPARISON:  12/11/2013, 12/10/2013 FINDINGS: No acute fracture or subluxation. No radiopaque foreign body or soft tissue gas. There is mild soft tissue swelling in the region of the metatarsophalangeal joints. IMPRESSION: Soft tissue swelling region metatarsophalangeal joints. Otherwise no acute bony abnormality. Electronically Signed   By: Nolon Nations M.D.   On: 04/23/2016 18:45    EKG: Independently reviewed, sinus rhythm at 80/min.    Assessment/Plan Active Problems:   CAP (community acquired pneumonia)   community acquired pneumonia: Right sided pneumonia, admitted to med surg.  Started on IV rocephin and IV zithromax.  Blood cultures and sputum cultures ordered.  Urine for strep pneumonia ordered and pending.  [place] oxygen to keep sats greater than 90%. Robitussin.  Influenza PCR ordered.  Pain control.  Chest pain on coughing and deep inspiration.  Pleurisy. Pain control.  EKG repeat.  Troponin negative.  BNP negative.   Acute on CKD stage 3: Suspect from dehydration.  Gentle hydration.  Get UA,  Get US RENAL. Strict intake and output.    Left foot diabetic ulcer; does not appear to be infected, pt reports it has been there for one year.  Wound care consult.  Dressing twice daily.  X ray of the left foot.      DVT prophylaxis: lovenox.  Code Status: full code.  Family Communication: wife at bedside.  Disposition Plan: pending further eval.  Consults called: none.  Admission status: inpatient, Estrellita Ludwig MD [facility] Pager [phone]  If 7PM-7AM, please contact night-coverage [email or web address] Password [email or web address]  04/23/2016, 7:28 PM

## 2016-04-23 NOTE — ED Provider Notes (Signed)
Cody Webster Provider Note   CSN: [id number] Arrival date & time: 04/23/16  1418     History   Chief Complaint Chief Complaint  Patient presents with  . Chest Pain  . Cough    HPI Cody Webster is a 50 y.o. male.  Patient presents to the ED with a chief complaint of cough and chest pain.  He reports that the symptoms started a few days ago.  He states his cough is non productive, coughing makes his chest hurt worse. He reports having associated chills, but never measured a temperature.  He has not tried taking anything for his symptoms.  He states that he gets pneumonia about once a year and states that this feels like pneumonia.  There are no other associated symptoms.  There are no other modifying factors.   The history is provided by the patient. No language interpreter was used.    Past Medical History:  Diagnosis Date  . Carotid artery occlusion   . CHF (congestive heart failure) ([place])   . Diabetes mellitus without complication ([place])   . Hypertension     Patient Active Problem List   Diagnosis Date Noted  . Malnutrition of moderate degree ([place]) 01/12/2015  . Chronic diastolic heart failure ([place]) 01/11/2015  . Toxic metabolic encephalopathy 32/06/3341  . AKI (acute kidney injury) ([place]) 01/11/2015  . DM type 2, uncontrolled, with neuropathy ([place]) 01/11/2015  . Hyperosmolar non-ketotic state in patient with type 2 diabetes mellitus ([place]) 01/11/2015  . Hyperglycemic hyperosmolar nonketotic coma ([place]) 01/11/2015  . Non-ketotic hyperglycinemia, type II ([place]) 01/11/2015  . Glaucoma   . Hypertensive urgency 09/25/2014  . Eye pain   . Hypoglycemia 09/06/2014  . Acute glaucoma of right eye 09/06/2014  . Acute-on-chronic kidney injury ([place]) 09/06/2014  . Type I diabetes mellitus with complication, uncontrolled ([place]) 09/06/2014  . Anemia 09/06/2014  . Tobacco use disorder 09/06/2014  . Frequent headaches 07/11/2014  . Hyperglycemia 07/11/2014  . 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 CKD (chronic kidney disease), stage III 07/11/2014  . CHF (congestive heart failure) ([place]) 07/11/2014  . Hyperglycemia due to type 2 diabetes mellitus ([place]) 07/11/2014  . Ataxia 07/11/2014  . CKD (chronic kidney disease) 12/11/2013  . Diabetic foot ulcer ([place]) 12/10/2013  . Diabetes ([place]) 12/10/2013  . HTN (hypertension) 12/10/2013  . Peripheral neuropathy ([place]) 12/10/2013    Past Surgical History:  Procedure Laterality Date  . right foot surgery    . TOE AMPUTATION         Home Medications    Prior to Admission medications   Medication Sig Start Date End Date Taking? Authorizing Provider  carvedilol (COREG) 25 MG tablet Take 25 mg by mouth every morning. Reported on 05/27/2015 08/12/14   Historical Provider, MD  cloNIDine (CATAPRES) 0.2 MG tablet Take 0.2 mg by mouth 2 (two) times daily. 11/26/14   Historical Provider, MD  gabapentin (NEURONTIN) 300 MG capsule Take 300 mg by mouth 3 (three) times daily. 08/22/14   Historical Provider, MD  hydrALAZINE (APRESOLINE) 25 MG tablet Take 1 tablet (25 mg total) by mouth every 8 (eight) hours. Patient taking differently: Take 50 mg by mouth 3 (three) times daily.  09/27/14   Robbie Lis, MD  hydrochlorothiazide (HYDRODIURIL) 25 MG tablet Take 25 mg by mouth daily.    Historical Provider, MD  hydroxypropyl methylcellulose / hypromellose (ISOPTO TEARS / GONIOVISC) 2.5 % ophthalmic solution Place 1 drop into both eyes 3 (three) times daily  as needed for dry eyes.    Historical Provider, MD  insulin aspart protamine - aspart (NOVOLOG 70/30 MIX) (70-30) 100 UNIT/ML FlexPen 20 units in the morning & 12 units in the evening 01/12/15   Annita Brod, MD  ondansetron (ZOFRAN) 4 MG tablet take 1 tablet by mouth every 12 hours if needed 12/31/14   Historical Provider, MD  oxyCODONE-acetaminophen (PERCOCET) 10-325 MG tablet Take 1 tablet by mouth 4 (four) times daily. 04/06/16   Historical Provider, MD    oxyCODONE-acetaminophen (PERCOCET) 7.5-325 MG per tablet Take 1 tablet by mouth 4 (four) times daily. Reported on 05/27/2015 12/31/14   Historical Provider, MD  PENTOXIFYLLINE ER PO Take 400 mg by mouth daily.    Historical Provider, MD    Family History Family History  Problem Relation Age of Onset  . CAD Mother   . Diabetes type II Father   . Hypertension Sister   . CAD Sister   . Diabetes type II Brother     Social History Social History  Substance Use Topics  . Smoking status: Current Every Day Smoker    Packs/day: 0.50    Years: 30.00    Types: Cigarettes, Cigars  . Smokeless tobacco: Former Systems developer    Types: Snuff, Chew    Quit date: 12/11/1983  . Alcohol use Yes     Comment: ocassaionlly      Allergies   Influenza vaccines; Pneumococcal vaccines; and Morphine and related   Review of Systems Review of Systems  Constitutional: Positive for chills.  Respiratory: Positive for cough.   Cardiovascular: Positive for chest pain.  All other systems reviewed and are negative.    Physical Exam Updated Vital Signs BP 134/70 (BP Location: Left Arm)   Pulse 80   Temp 98.4 F (36.9 C) (Oral)   Resp 15   SpO2 93%   Physical Exam  Constitutional: He is oriented to person, place, and time. He appears well-developed and well-nourished.  HENT:  Head: Normocephalic and atraumatic.  Eyes: Conjunctivae and EOM are normal. Pupils are equal, round, and reactive to light. Right eye exhibits no discharge. Left eye exhibits no discharge. No scleral icterus.  Neck: Normal range of motion. Neck supple. No JVD present.  Cardiovascular: Normal rate, regular rhythm and normal heart sounds.  Exam reveals no gallop and no friction rub.   No murmur heard. Pulmonary/Chest: Effort normal and breath sounds normal. No respiratory distress. He has no wheezes. He has no rales. He exhibits no tenderness.  Abdominal: Soft. He exhibits no distension and no mass. There is no tenderness. There is no  rebound and no guarding.  Musculoskeletal: Normal range of motion. He exhibits no edema or tenderness.  Neurological: He is alert and oriented to person, place, and time.  Skin: Skin is warm and dry.  Psychiatric: He has a normal mood and affect. His behavior is normal. Judgment and thought content normal.  Nursing note and vitals reviewed.    ED Treatments / Results  Labs (all labs ordered are listed, but only abnormal results are displayed) Labs Reviewed  BASIC METABOLIC PANEL  CBC WITH DIFFERENTIAL/PLATELET  BRAIN NATRIURETIC PEPTIDE  I-STAT [place], ED  I-STAT CG4 LACTIC ACID, ED    EKG  EKG Interpretation None       Radiology Dg Chest 2 View  Result Date: 04/23/2016 CLINICAL DATA:  Congestive cough. EXAM: CHEST  2 VIEW COMPARISON:  January 03, 2015 FINDINGS: Infiltrate is seen in the right mid and lower lung in a  perihilar distribution. No other interval changes or acute abnormalities. IMPRESSION: Right-sided pneumonia.  Recommend follow-up to resolution. Electronically Signed   By: Dorise Bullion III M.D   On: 04/23/2016 14:48    Procedures Procedures (including critical care time)  Medications Ordered in ED Medications  cefTRIAXone (ROCEPHIN) 1 g in dextrose 5 % 50 mL IVPB (0 g Intravenous Stopped 04/23/16 1630)  azithromycin (ZITHROMAX) 500 mg in dextrose 5 % 250 mL IVPB (500 mg Intravenous New Bag/Given 04/23/16 1633)     Initial Impression / Assessment and Plan / ED Course  I have reviewed the triage vital signs and the nursing notes.  Pertinent labs & imaging results that were available during my care of the patient were reviewed by me and considered in my medical decision making (see chart for details).  Clinical Course     Patient with cough and chest pain.  Coughing makes the chest pain worse, but the pain has been fairly constant since onset 2-3 days ago.  No reported fever, but has had chills.  Hx of DM and CHF.  He doesn't have any leg  swelling.  CXR is consistent with right-sided pneumonia.  Will check labs.  Patient would like to try outpatient treatment at this time.  Barring any grossly abnormal lab results, I think this is reasonable given his normal VS.  5:30 PM Cr is 4.24, significantly worse than most recent Cr from last year.  Additionally, patient O2 saturation dropped to 91% at rest.  I discussed the patient with Dr. Audie Pinto, who agrees with plan for admission.  Appreciate Dr. Karleen Hampshire for admitting the patient.  Final Clinical Impressions(s) / ED Diagnoses   Final diagnoses:  Community acquired pneumonia of right lung, unspecified part of lung  AKI (acute kidney injury) [facility])    New Prescriptions New Prescriptions   No medications on file     Montine Circle, PA-C 04/23/16 1740    Leonard Schwartz, MD 04/24/16 1223

## 2016-04-23 NOTE — ED Triage Notes (Signed)
Patient reports non productive cough, congestion, and constant sharp central chest pain x2 days. Intermittent radiation to right chest. Denies N/V/D.

## 2016-04-23 NOTE — ED Notes (Signed)
This RN was informed by technician that pt had near syncopal episode while standing to obtain chest x-ray. State the pt was standing and was not responding when asked to sit down. Pts vitals stable.

## 2016-04-23 NOTE — ED Notes (Signed)
I have just applied a small Alevyn dressing to the open, round wound on bottom of left distal foot (just proximal to 4th metatarsal). It is dime-sized and ~.5cm in depth.

## 2016-04-23 NOTE — ED Notes (Signed)
Writer notified PA of abnormal I-stat trop result

## 2016-04-24 DIAGNOSIS — I10 Essential (primary) hypertension: Secondary | ICD-10-CM

## 2016-04-24 DIAGNOSIS — H409 Unspecified glaucoma: Secondary | ICD-10-CM

## 2016-04-24 DIAGNOSIS — L97523 Non-pressure chronic ulcer of other part of left foot with necrosis of muscle: Secondary | ICD-10-CM

## 2016-04-24 DIAGNOSIS — J181 Lobar pneumonia, unspecified organism: Secondary | ICD-10-CM

## 2016-04-24 DIAGNOSIS — E10621 Type 1 diabetes mellitus with foot ulcer: Secondary | ICD-10-CM

## 2016-04-24 LAB — COMPREHENSIVE METABOLIC PANEL
ALBUMIN: 2.6 g/dL — AB (ref 3.5–5.0)
ALK PHOS: 673 U/L — AB (ref 38–126)
ALT: 31 U/L (ref 17–63)
ANION GAP: 7 (ref 5–15)
AST: 26 U/L (ref 15–41)
BILIRUBIN TOTAL: 0.6 mg/dL (ref 0.3–1.2)
BUN: 35 mg/dL — ABNORMAL HIGH (ref 6–20)
CALCIUM: 7.6 mg/dL — AB (ref 8.9–10.3)
CO2: 21 mmol/L — ABNORMAL LOW (ref 22–32)
Chloride: 107 mmol/L (ref 101–111)
Creatinine, Ser: 4.04 mg/dL — ABNORMAL HIGH (ref 0.61–1.24)
GFR, EST AFRICAN AMERICAN: 18 mL/min — AB (ref >60)
GFR, EST NON AFRICAN AMERICAN: 16 mL/min — AB (ref >60)
GLUCOSE: 157 mg/dL — AB (ref 65–99)
POTASSIUM: 3.9 mmol/L (ref 3.5–5.1)
Sodium: 135 mmol/L (ref 135–145)
Total Protein: 6.1 g/dL — ABNORMAL LOW (ref 6.5–8.1)

## 2016-04-24 LAB — HIV ANTIBODY (ROUTINE TESTING W REFLEX): HIV Screen 4th Generation wRfx: NONREACTIVE

## 2016-04-24 LAB — GLUCOSE, CAPILLARY
GLUCOSE-CAPILLARY: 164 mg/dL — AB (ref 65–99)
GLUCOSE-CAPILLARY: 191 mg/dL — AB (ref 65–99)
Glucose-Capillary: 92 mg/dL (ref 65–99)
Glucose-Capillary: 113 mg/dL — ABNORMAL HIGH (ref 65–99)

## 2016-04-24 LAB — CBC
HEMATOCRIT: 34 % — AB (ref 39.0–52.0)
HEMOGLOBIN: 11.5 g/dL — AB (ref 13.0–17.0)
MCH: 31.5 pg (ref 26.0–34.0)
MCHC: 33.8 g/dL (ref 30.0–36.0)
MCV: 93.2 fL (ref 78.0–100.0)
Platelets: 142 10*3/uL — ABNORMAL LOW (ref 150–400)
RBC: 3.65 MIL/uL — ABNORMAL LOW (ref 4.22–5.81)
RDW: 12.7 % (ref 11.5–15.5)
WBC: 3 10*3/uL — ABNORMAL LOW (ref 4.0–10.5)

## 2016-04-24 LAB — STREP PNEUMONIAE URINARY ANTIGEN: Strep Pneumo Urinary Antigen: POSITIVE — AB

## 2016-04-24 MED ORDER — ONDANSETRON HCL 4 MG PO TABS: 4.0000 mg | ORAL_TABLET | Freq: Three times a day (TID) | ORAL | Status: DC | PRN

## 2016-04-24 MED ORDER — PROMETHAZINE HCL 25 MG PO TABS
12.5000 mg | ORAL_TABLET | Freq: Four times a day (QID) | ORAL | Status: DC | PRN
  Administered 2016-04-24 – 2016-04-25 (×2): 12.5 mg via ORAL
  Filled 2016-04-24 (×2): qty 1

## 2016-04-24 MED ORDER — TIMOLOL HEMIHYDRATE 0.5 % OP SOLN: 1.0000 [drp] | Freq: Two times a day (BID) | OPHTHALMIC | Status: DC

## 2016-04-24 MED ORDER — IPRATROPIUM-ALBUTEROL 0.5-2.5 (3) MG/3ML IN SOLN
3.0000 mL | Freq: Four times a day (QID) | RESPIRATORY_TRACT | Status: DC
  Administered 2016-04-24: 3 mL via RESPIRATORY_TRACT
  Filled 2016-04-24: qty 3

## 2016-04-24 MED ORDER — IPRATROPIUM-ALBUTEROL 0.5-2.5 (3) MG/3ML IN SOLN
3.0000 mL | Freq: Two times a day (BID) | RESPIRATORY_TRACT | Status: DC
  Administered 2016-04-25 – 2016-04-27 (×4): 3 mL via RESPIRATORY_TRACT
  Filled 2016-04-24 (×5): qty 3

## 2016-04-24 MED ORDER — TIMOLOL MALEATE 0.5 % OP SOLN
1.0000 [drp] | Freq: Two times a day (BID) | OPHTHALMIC | Status: DC
  Administered 2016-04-24 – 2016-04-27 (×7): 1 [drp] via OPHTHALMIC
  Filled 2016-04-24: qty 5

## 2016-04-24 MED ORDER — LATANOPROST 0.005 % OP SOLN
1.0000 [drp] | Freq: Every day | OPHTHALMIC | Status: DC
  Administered 2016-04-24 – 2016-04-26 (×3): 1 [drp] via OPHTHALMIC
  Filled 2016-04-24: qty 2.5

## 2016-04-24 MED ORDER — ONDANSETRON HCL 4 MG/2ML IJ SOLN
4.0000 mg | Freq: Four times a day (QID) | INTRAMUSCULAR | Status: DC | PRN
Start: 2016-04-24 — End: 2016-04-27
  Administered 2016-04-24: 4 mg via INTRAVENOUS
  Filled 2016-04-24: qty 2

## 2016-04-24 NOTE — Progress Notes (Signed)
PROGRESS NOTE    Cody Webster  OIT:[id number] DOB: [birth year] DOA: 04/23/2016 PCP: Kerin Perna, NP   Brief Narrative:  Cody Webster is a 50 y.o. male with medical history significant of hypertension, stage 3 CKD, diabetes mellitus, chf, came in for sob, cough, fever and chills for 4 to 5 days. On arrival to ED, he was found to be afebrile, wbc count around 3.8. CXR reveals right sided pneumonia. He was referred to medical service for admission for management of CAP.  Further Admission lab work revealed hemoglobin of 11.6. Sodium of 134. creatinins 4.24, BUN of 35. Lactic acid of 1.27. Troponin 0.04. BNP of 81.   Assessment & Plan:   Active Problems:   Diabetic foot ulcer ([place])   HTN (hypertension)   Peripheral neuropathy (HCC)   Acute-on-chronic kidney injury ([place])   Type I diabetes mellitus with complication, uncontrolled ([place])   AKI (acute kidney injury) ([place])   CAP (community acquired pneumonia)  RML and RLL Strep Pneumo Community Acquired Pneumonia, poA -CXR showed Infiltrate is seen in the right mid and lower lung in a perihilar distribution. No other interval changes or acute abnormalities -C/w IV Rocephin and IV Zithromax.  -Blood Cultures x2 and Sputum cultures ordered.  -Urine for strep pneumonia Positive.  -[place] oxygen to keep sats greater than 90%. -Added DuoNeb q6h -Influenza PCR Negative -Pain Control with Tramadol, Norco and Dilaudid. Acetaminophen for Fever, Headache, Mild Pain -Zofran and Phenergan for Nausea -WBC went from 3.8 -> 3.0 -C/w NS at 100 mL/hr  Chest pain on coughing and deep inspiration.  -Pleurisy. Pain control as above.  -EKG repeat.  -Troponin negative.  -BNP negative.   AKI on CKD stage 3: -Patient's Cr was 4.04 Suspect from dehydration.  Gentle hydration.  Get UA,  Get US RENAL. Strict intake and output.   Diabetes Mellitus Type 2 -Held Metformin -C/w Sensitive Novolog SSI ACHS -C/w Monitoring CBG's; Have ranged from  92-191 -C/w Gabapentin 300 mg TID  Left Foot Diabetic Ulcer with Necrosis of Muscle  -Does not appear to be infected, pt reports it has been there for one year.  -Wound care consult.  -C/w Dressing twice daily.  -X ray of the left foot showed No acute fracture or subluxation. No radiopaque foreign body or soft tissue gas. There is mild soft tissue swelling in the region of the metatarsophalangeal joints.  Glaucoma -C/w Timolol 0.5% Opthalmic Solution 1 drop Right Eye BID -C/w Lantaprost 0.005% Opthalmic Solution 1 drop Both Eyes qHS -C/w Liquifilm Tears 1 drop both Eyes TIDprn -Patient Takes Brinzolamide-Bimonidine 1-0.2% 1 Drop in Right Eye BID but it is not on Formulary; Will need to take Own MED  Hypertension -HCTZ held because of AKI  -C/w Clonidine 0.2 mg po BID, Carvedilol 25 mg po BID, and Hydralazine 50 mg TID  Elevated Alk Phos -Patient's Alk Phos was 673 -Will Continue to Monitor  DVT prophylaxis: Lovenox sq 30 mg Code Status: FULL CODE Family Communication: No Family Present at Bedside Disposition Plan: Remain Inpatient  Consultants:   None  Procedures: None  Antimicrobials: IV Azithromycin and IV Ceftriaxone  Subjective: Seen and examined and stated he still felt bad. Was nauseous some and SOB still there. CP slightly better. No other concerns or complaints except wanting his Glaucoma medications as he recently had eye procedure.   Objective: Vitals:   04/23/16 1900 04/23/16 1949 04/24/16 0457 04/24/16 0515  BP: 155/86 (!) 158/82 (!) 172/82 (!) 176/92  Pulse: 88 76 80  Resp: 24 (!) 22 20   Temp:  99.2 F (37.3 C) 98.4 F (36.9 C)   TempSrc:  Oral Oral   SpO2: 91% 98% 98%   Weight:  99.8 kg (220 lb 1.6 oz)    Height:  6' (1.829 m)      Intake/Output Summary (Last 24 hours) at 04/24/16 0835 Last data filed at 04/24/16 0600  Gross per 24 hour  Intake           976.67 ml  Output              275 ml  Net           701.67 ml   Filed Weights    04/23/16 1949  Weight: 99.8 kg (220 lb 1.6 oz)    Examination: Physical Exam:  Constitutional: WN/WD, NAD and appears calm and comfortable Eyes: PERRL, lids and conjunctivae normal, sclerae anicteric  ENMT: External Ears, Nose appear normal. Grossly normal hearing.  Neck: Appears normal, supple, no cervical masses, normal ROM, no appreciable thyromegaly, no JVD Respiratory: Diminished Right Lung with rhonchi. No wheezing, rales, or crackles. Normal respiratory effort and patient is not tachypenic. No accessory muscle use.  Cardiovascular: RRR, no murmurs / rubs / gallops. S1 and S2 auscultated. No extremity edema.  Abdomen: Soft, non-tender, non-distended. No masses palpated. No appreciable hepatosplenomegaly. Bowel sounds positive.  GU: Deferred. Musculoskeletal: No clubbing / cyanosis of digits/nails. No joint deformity upper and lower extremities. No contractures. Normal strength and muscle tone.  Skin: Left Foot Ulcer noted.  Neurologic: CN 2-12 grossly intact with no focal deficits. Sensation intact in all 4 Extremities. Strength 5/5 in all 4. Romberg sign cerebellar reflexes not assessed.  Psychiatric: Normal judgment and insight. Alert and oriented x 3. Normal mood and appropriate affect.   Data Reviewed: I have personally reviewed following labs and imaging studies  CBC:  Recent Labs Lab 04/23/16 1640 04/24/16 0518  WBC 3.8* 3.0*  NEUTROABS 1.9  --   HGB 11.6* 11.5*  HCT 34.9* 34.0*  MCV 94.8 93.2  PLT 160 283*   Basic Metabolic Panel:  Recent Labs Lab 04/23/16 1500 04/24/16 0518  NA 134* 135  K 4.0 3.9  CL 105 107  CO2 21* 21*  GLUCOSE 197* 157*  BUN 35* 35*  CREATININE 4.24* 4.04*  CALCIUM 8.0* 7.6*   GFR: Estimated Creatinine Clearance: 26.2 mL/min (by C-G formula based on SCr of 4.04 mg/dL (H)). Liver Function Tests:  Recent Labs Lab 04/24/16 0518  AST 26  ALT 31  ALKPHOS 673*  BILITOT 0.6  PROT 6.1*  ALBUMIN 2.6*   No results for input(s):  LIPASE, AMYLASE in the last 168 hours. No results for input(s): AMMONIA in the last 168 hours. Coagulation Profile: No results for input(s): INR, PROTIME in the last 168 hours. Cardiac Enzymes: No results for input(s): CKTOTAL, CKMB, CKMBINDEX, TROPONINI in the last 168 hours. BNP (last 3 results) No results for input(s): PROBNP in the last 8760 hours. HbA1C: No results for input(s): HGBA1C in the last 72 hours. CBG:  Recent Labs Lab 04/23/16 1817 04/23/16 2007 04/24/16 0727  GLUCAP 153* 178* 164*   Lipid Profile: No results for input(s): CHOL, HDL, LDLCALC, TRIG, CHOLHDL, LDLDIRECT in the last 72 hours. Thyroid Function Tests: No results for input(s): TSH, T4TOTAL, FREET4, T3FREE, THYROIDAB in the last 72 hours. Anemia Panel: No results for input(s): VITAMINB12, FOLATE, FERRITIN, TIBC, IRON, RETICCTPCT in the last 72 hours. Sepsis Labs:  Recent Labs Lab 04/23/16 1636  LATICACIDVEN 1.27    No results found for this or any previous visit (from the past 240 hour(s)).   Radiology Studies: Dg Chest 2 View  Result Date: 04/23/2016 CLINICAL DATA:  Congestive cough. EXAM: CHEST  2 VIEW COMPARISON:  January 03, 2015 FINDINGS: Infiltrate is seen in the right mid and lower lung in a perihilar distribution. No other interval changes or acute abnormalities. IMPRESSION: Right-sided pneumonia.  Recommend follow-up to resolution. Electronically Signed   By: Dorise Bullion III M.D   On: 04/23/2016 14:48   US Renal  Result Date: 04/23/2016 CLINICAL DATA:  Acute renal failure EXAM: RENAL / URINARY TRACT ULTRASOUND COMPLETE COMPARISON:  None. FINDINGS: Right Kidney: Length: 11.5 cm. Diffusely increased parenchymal echogenicity, consistent with medical renal disease. No hydronephrosis. No focal lesion. Left Kidney: Length: 12.1 cm. Diffusely increased parenchymal echogenicity, consistent with medical renal disease. No hydronephrosis. No focal lesion. Bladder: Appears normal for degree of  bladder distention. IMPRESSION: Negative for hydronephrosis. Increased renal parenchymal echogenicity, consistent with medical renal disease. Electronically Signed   By: Andreas Newport M.D.   On: 04/23/2016 23:36   Dg Foot Complete Left  Result Date: 04/23/2016 CLINICAL DATA:  Ulcer on bottom of foot, at the level of MTP joints x years per pt; EXAM: LEFT FOOT - COMPLETE 3+ VIEW COMPARISON:  12/11/2013, 12/10/2013 FINDINGS: No acute fracture or subluxation. No radiopaque foreign body or soft tissue gas. There is mild soft tissue swelling in the region of the metatarsophalangeal joints. IMPRESSION: Soft tissue swelling region metatarsophalangeal joints. Otherwise no acute bony abnormality. Electronically Signed   By: Nolon Nations M.D.   On: 04/23/2016 18:45   Scheduled Meds: . azithromycin  500 mg Intravenous Q24H  . carvedilol  25 mg Oral BID WC  . cefTRIAXone (ROCEPHIN)  IV  1 g Intravenous Q24H  . cloNIDine  0.2 mg Oral BID  . enoxaparin (LOVENOX) injection  30 mg Subcutaneous Q24H  . gabapentin  300 mg Oral TID  . hydrALAZINE  50 mg Oral TID  . insulin aspart  0-5 Units Subcutaneous QHS  . insulin aspart  0-9 Units Subcutaneous TID WC   Continuous Infusions: . sodium chloride 100 mL/hr at 04/23/16 2324    LOS: 1 day   Kerney Elbe, DO [facility] Pager [phone]  If 7PM-7AM, please contact night-coverage [email or web address] Password [email or web address] 04/24/2016, 8:35 AM

## 2016-04-25 ENCOUNTER — Inpatient Hospital Stay (HOSPITAL_COMMUNITY): Payer: Medicaid Other

## 2016-04-25 LAB — COMPREHENSIVE METABOLIC PANEL
ALBUMIN: 2.7 g/dL — AB (ref 3.5–5.0)
ALT: 32 U/L (ref 17–63)
ANION GAP: 6 (ref 5–15)
AST: 35 U/L (ref 15–41)
Alkaline Phosphatase: 718 U/L — ABNORMAL HIGH (ref 38–126)
BUN: 36 mg/dL — AB (ref 6–20)
CHLORIDE: 108 mmol/L (ref 101–111)
CO2: 23 mmol/L (ref 22–32)
Calcium: 8 mg/dL — ABNORMAL LOW (ref 8.9–10.3)
Creatinine, Ser: 3.85 mg/dL — ABNORMAL HIGH (ref 0.61–1.24)
GFR calc Af Amer: 19 mL/min — ABNORMAL LOW (ref >60)
GFR, EST NON AFRICAN AMERICAN: 17 mL/min — AB (ref >60)
GLUCOSE: 109 mg/dL — AB (ref 65–99)
POTASSIUM: 4.4 mmol/L (ref 3.5–5.1)
Sodium: 137 mmol/L (ref 135–145)
Total Bilirubin: 0.6 mg/dL (ref 0.3–1.2)
Total Protein: 6.5 g/dL (ref 6.5–8.1)

## 2016-04-25 LAB — CBC WITH DIFFERENTIAL/PLATELET
BASOS ABS: 0 10*3/uL (ref 0.0–0.1)
Basophils Relative: 0 %
Eosinophils Absolute: 0.3 10*3/uL (ref 0.0–0.7)
Eosinophils Relative: 7 %
HCT: 38.1 % — ABNORMAL LOW (ref 39.0–52.0)
Hemoglobin: 12.7 g/dL — ABNORMAL LOW (ref 13.0–17.0)
LYMPHS ABS: 1.3 10*3/uL (ref 0.7–4.0)
Lymphocytes Relative: 35 %
MCH: 31.7 pg (ref 26.0–34.0)
MCHC: 33.3 g/dL (ref 30.0–36.0)
MCV: 95 fL (ref 78.0–100.0)
MONO ABS: 0.3 10*3/uL (ref 0.1–1.0)
Monocytes Relative: 9 %
Neutro Abs: 1.9 10*3/uL (ref 1.7–7.7)
Neutrophils Relative %: 49 %
PLATELETS: 168 10*3/uL (ref 150–400)
RBC: 4.01 MIL/uL — ABNORMAL LOW (ref 4.22–5.81)
RDW: 12.8 % (ref 11.5–15.5)
WBC: 3.8 10*3/uL — AB (ref 4.0–10.5)

## 2016-04-25 LAB — GLUCOSE, CAPILLARY
GLUCOSE-CAPILLARY: 148 mg/dL — AB (ref 65–99)
Glucose-Capillary: 137 mg/dL — ABNORMAL HIGH (ref 65–99)
Glucose-Capillary: 152 mg/dL — ABNORMAL HIGH (ref 65–99)
Glucose-Capillary: 131 mg/dL — ABNORMAL HIGH (ref 65–99)

## 2016-04-25 LAB — HEMOGLOBIN A1C
Hgb A1c MFr Bld: 6.7 % — ABNORMAL HIGH (ref 4.8–5.6)
MEAN PLASMA GLUCOSE: 146 mg/dL

## 2016-04-25 LAB — PHOSPHORUS: Phosphorus: 4.1 mg/dL (ref 2.5–4.6)

## 2016-04-25 LAB — GAMMA GT: GGT: 910 U/L — ABNORMAL HIGH (ref 7–50)

## 2016-04-25 LAB — MAGNESIUM: MAGNESIUM: 1.8 mg/dL (ref 1.7–2.4)

## 2016-04-25 MED ORDER — PANTOPRAZOLE SODIUM 40 MG PO TBEC
40.0000 mg | DELAYED_RELEASE_TABLET | Freq: Every day | ORAL | Status: DC
  Administered 2016-04-25 – 2016-04-27 (×3): 40 mg via ORAL
  Filled 2016-04-25 (×3): qty 1

## 2016-04-25 MED ORDER — GI COCKTAIL ~~LOC~~
30.0000 mL | Freq: Once | ORAL | Status: AC
  Administered 2016-04-25: 30 mL via ORAL
  Filled 2016-04-25: qty 30

## 2016-04-25 MED ORDER — PREMIER PROTEIN SHAKE
11.0000 [oz_av] | Freq: Two times a day (BID) | ORAL | Status: DC
  Administered 2016-04-25 – 2016-04-27 (×3): 11 [oz_av] via ORAL
  Filled 2016-04-25 (×2): qty 325.31

## 2016-04-25 MED ORDER — NYSTATIN 100000 UNIT/ML MT SUSP
5.0000 mL | Freq: Four times a day (QID) | OROMUCOSAL | Status: DC
  Administered 2016-04-25 – 2016-04-27 (×7): 500000 [IU] via ORAL
  Filled 2016-04-25 (×7): qty 5

## 2016-04-25 MED ORDER — DM-GUAIFENESIN ER 30-600 MG PO TB12
1.0000 | ORAL_TABLET | Freq: Two times a day (BID) | ORAL | Status: DC
  Administered 2016-04-25 – 2016-04-27 (×5): 1 via ORAL
  Filled 2016-04-25 (×5): qty 1

## 2016-04-25 MED ORDER — SODIUM CHLORIDE 3 % IN NEBU
4.0000 mL | INHALATION_SOLUTION | Freq: Every day | RESPIRATORY_TRACT | Status: DC
  Administered 2016-04-25 – 2016-04-27 (×2): 4 mL via RESPIRATORY_TRACT
  Filled 2016-04-25 (×3): qty 4

## 2016-04-25 NOTE — Progress Notes (Signed)
PROGRESS NOTE    Cody Webster  DUK:[id number] DOB: [birth year] DOA: 04/23/2016 PCP: Kerin Perna, NP   Brief Narrative:  Plummer Matich is a 50 y.o. male with medical history significant of hypertension, stage 3 CKD, Diabetes Mellitus, CHF, came in for sob, cough, fever and chills for 4 to 5 days. On arrival to ED, he was found to be afebrile, wbc count around 3.8. CXR reveals right sided pneumonia. He was referred to medical service for admission for management of RML and RLL CAP from Strep Pneumo.   Assessment & Plan:   Active Problems:   Diabetic foot ulcer ([place])   HTN (hypertension)   Peripheral neuropathy (HCC)   Acute-on-chronic kidney injury ([place])   Type I diabetes mellitus with complication, uncontrolled ([place])   AKI (acute kidney injury) ([place])   CAP (community acquired pneumonia)  RML and RLL Strep Pneumo Community Acquired Pneumonia, poA -CXR showed Infiltrate is seen in the right mid and lower lung in a perihilar distribution. No other interval changes or acute abnormalities -C/w IV Rocephin and IV Zithromax.  -Blood Cultures x2 and Sputum cultures ordered.  -Urine for Strep Pneumonia Positive.  -[place] oxygen to keep sats greater than 90%. -DuoNeb q6h changed to BID; Added Hypertonic Saline Nebs Today.  -Influenza PCR Negative -Pain Control with Tramadol, Norco and Dilaudid. Acetaminophen for Fever, Headache, Mild Pain -Zofran and Phenergan for Nausea -WBC went from 3.8 -> 3.0 -> 3.8 -C/w NS at 100 mL/hr -Repeat CXR in AM  Chest pain on coughing and deep inspiration.  -Pleurisy. Pain control as above.  -Troponin negative.  -BNP negative.   AKI on CKD stage 3: -Patient's BUN/Cr improved to 36/3.85 from 35/4.04 -Suspect from dehydration.  -C/w IVF Rehydration with NS at 100 mL/hr -Retroperitoneal U/S showed Negative for hydronephrosis. Increased renal parenchymal echogenicity, consistent with medical renal disease. -U/A Negative  Diabetes Mellitus Type  2 -Held Metformin -Repeat HbA1c was 6.7 -C/w Sensitive Novolog SSI ACHS -C/w Monitoring CBG's; Have ranged from 137-152 -C/w Gabapentin 300 mg TID  Left Foot Diabetic Ulcer with Necrosis of Muscle  -Does not appear to be infected, pt reports it has been there for one year.  -Wound care consult.  -C/w Dressing twice daily.  -X ray of the left foot showed No acute fracture or subluxation. No radiopaque foreign body or soft tissue gas. There is mild soft tissue swelling in the region of the metatarsophalangeal joints.   Glaucoma -C/w Timolol 0.5% Opthalmic Solution 1 drop Right Eye BID -C/w Lantaprost 0.005% Opthalmic Solution 1 drop Both Eyes qHS -C/w Liquifilm Tears 1 drop both Eyes TIDprn -Patient Takes Brinzolamide-Bimonidine 1-0.2% 1 Drop in Right Eye BID but it is not on Formulary; Will need to take Own MED and Have asked Pharmacy to help.   Hypertension -BP has been ranging 143-172 and DBP ranged from 77-81 -HCTZ held because of AKI  -C/w Clonidine 0.2 mg po BID, Carvedilol 25 mg po BID, and Hydralazine 50 mg TID  Isolated Elevated Alk Phos -Patient's Alk Phos was 673 and increased to 718 -Will obtain RUQ Ultrasound with evaluation of Biliary Tree -Bilirubin, AST, ALT WNL -Will get GGT -Continue to Monitor and Repeat CMP in AM and Liver Panel  GERD/Indigestion -Patient was given GI Cocktail 30 mL once -Started patient on PPI with Pantoprazole 40 mg po Daily  DVT prophylaxis: Lovenox sq 30 mg Code Status: FULL CODE Family Communication: No Family Present at Bedside Disposition Plan: Remain Inpatient  Consultants:   None  Procedures:  None  Antimicrobials: IV Azithromycin and IV Ceftriaxone  Subjective: Seen and examined and stated he felt a little better. No N/V but stated he had a lot of Indigestion and Abdominal Burning. No CP or SOB. No other concerns or complaints at this time.    Objective: Vitals:   04/24/16 2125 04/25/16 0538 04/25/16 0929 04/25/16 1450   BP: (!) 143/77 (!) 172/81  137/71  Pulse: 72 80  75  Resp: _0 Temp: 98.5 F (36.9 C) 99.3 F (37.4 C)  98.6 F (37 C)  TempSrc: Oral Oral  Oral  SpO2: 95% 95% 95% 95%  Weight:      Height:        Intake/Output Summary (Last 24 hours) at 04/25/16 1507 Last data filed at 04/25/16 1129  Gross per 24 hour  Intake          1541.67 ml  Output             1075 ml  Net           466.67 ml   Filed Weights   04/23/16 1949  Weight: 99.8 kg (220 lb 1.6 oz)    Examination: Physical Exam:  Constitutional: WN/WD, NAD and appears calm and comfortable Eyes: Lids and conjunctivae normal, sclerae anicteric  ENMT: External Ears, Nose appear normal. Grossly normal hearing.  Neck: Appears normal, supple, no cervical masses, normal ROM, no appreciable thyromegaly, no JVD Respiratory: Diminished Right Lung with rhonchi. No wheezing, rales, or crackles. Normal respiratory effort and patient is not tachypenic. No accessory muscle use.  Cardiovascular: RRR, no murmurs / rubs / gallops. S1 and S2 auscultated. No extremity edema.  Abdomen: Soft, mildly tender to palpate, non-distended. No masses palpated. No appreciable hepatosplenomegaly. Bowel sounds positive x4.  GU: Deferred. Musculoskeletal: No clubbing / cyanosis of digits/nails. No joint deformity upper and lower extremities. No contractures. Normal strength and muscle tone.  Skin: Left Foot Ulcer noted and did not appear infected.  Neurologic: CN 2-12 grossly intact with no focal deficits. Sensation intact in all 4 Extremities. Strength 5/5 in all 4. Romberg sign cerebellar reflexes not assessed.  Psychiatric: Normal judgment and insight. Alert and oriented x 3. Normal mood and appropriate affect.   Data Reviewed: I have personally reviewed following labs and imaging studies  CBC:  Recent Labs Lab 04/23/16 1640 04/24/16 0518 04/25/16 0518  WBC 3.8* 3.0* 3.8*  NEUTROABS 1.9  --  1.9  HGB 11.6* 11.5* 12.7*  HCT 34.9* 34.0*  38.1*  MCV 94.8 93.2 95.0  PLT 160 142* 161   Basic Metabolic Panel:  Recent Labs Lab 04/23/16 1500 04/24/16 0518 04/25/16 0518  NA 134* 135 137  K 4.0 3.9 4.4  CL 105 107 108  CO2 21* 21* 23  GLUCOSE 197* 157* 109*  BUN 35* 35* 36*  CREATININE 4.24* 4.04* 3.85*  CALCIUM 8.0* 7.6* 8.0*  MG  --   --  1.8  PHOS  --   --  4.1   GFR: Estimated Creatinine Clearance: 27.5 mL/min (by C-G formula based on SCr of 3.85 mg/dL (H)). Liver Function Tests:  Recent Labs Lab 04/24/16 0518 04/25/16 0518  AST 26 35  ALT 31 32  ALKPHOS 673* 718*  BILITOT 0.6 0.6  PROT 6.1* 6.5  ALBUMIN 2.6* 2.7*   No results for input(s): LIPASE, AMYLASE in the last 168 hours. No results for input(s): AMMONIA in the last 168 hours. Coagulation Profile: No results for input(s): INR, PROTIME in the  last 168 hours. Cardiac Enzymes: No results for input(s): CKTOTAL, CKMB, CKMBINDEX, TROPONINI in the last 168 hours. BNP (last 3 results) No results for input(s): PROBNP in the last 8760 hours. HbA1C:  Recent Labs  04/23/16 1500  HGBA1C 6.7*   CBG:  Recent Labs Lab 04/24/16 1132 04/24/16 1732 04/24/16 2122 04/25/16 0757 04/25/16 1139  GLUCAP 92 191* 113* 137* 152*   Lipid Profile: No results for input(s): CHOL, HDL, LDLCALC, TRIG, CHOLHDL, LDLDIRECT in the last 72 hours. Thyroid Function Tests: No results for input(s): TSH, T4TOTAL, FREET4, T3FREE, THYROIDAB in the last 72 hours. Anemia Panel: No results for input(s): VITAMINB12, FOLATE, FERRITIN, TIBC, IRON, RETICCTPCT in the last 72 hours. Sepsis Labs:  Recent Labs Lab 04/23/16 1636  LATICACIDVEN 1.27    No results found for this or any previous visit (from the past 240 hour(s)).   Radiology Studies: Dg Chest 2 View  Result Date: 04/25/2016 CLINICAL DATA:  Pneumonia. EXAM: CHEST  2 VIEW COMPARISON:  04/23/2016 FINDINGS: There has been slight improvement in the faint infiltrates in the right mid and lower lung zones.  Peribronchial thickening process. Left lung is clear. Heart size and vascularity are normal. No effusions. No bone abnormality. IMPRESSION: Slight improvement in the hazy pneumonia is in the right lung. Electronically Signed   By: Lorriane Shire M.D.   On: 04/25/2016 08:39   US Renal  Result Date: 04/23/2016 CLINICAL DATA:  Acute renal failure EXAM: RENAL / URINARY TRACT ULTRASOUND COMPLETE COMPARISON:  None. FINDINGS: Right Kidney: Length: 11.5 cm. Diffusely increased parenchymal echogenicity, consistent with medical renal disease. No hydronephrosis. No focal lesion. Left Kidney: Length: 12.1 cm. Diffusely increased parenchymal echogenicity, consistent with medical renal disease. No hydronephrosis. No focal lesion. Bladder: Appears normal for degree of bladder distention. IMPRESSION: Negative for hydronephrosis. Increased renal parenchymal echogenicity, consistent with medical renal disease. Electronically Signed   By: Andreas Newport M.D.   On: 04/23/2016 23:36   Dg Foot Complete Left  Result Date: 04/23/2016 CLINICAL DATA:  Ulcer on bottom of foot, at the level of MTP joints x years per pt; EXAM: LEFT FOOT - COMPLETE 3+ VIEW COMPARISON:  12/11/2013, 12/10/2013 FINDINGS: No acute fracture or subluxation. No radiopaque foreign body or soft tissue gas. There is mild soft tissue swelling in the region of the metatarsophalangeal joints. IMPRESSION: Soft tissue swelling region metatarsophalangeal joints. Otherwise no acute bony abnormality. Electronically Signed   By: Nolon Nations M.D.   On: 04/23/2016 18:45   Scheduled Meds: . azithromycin  500 mg Intravenous Q24H  . carvedilol  25 mg Oral BID WC  . cefTRIAXone (ROCEPHIN)  IV  1 g Intravenous Q24H  . cloNIDine  0.2 mg Oral BID  . dextromethorphan-guaiFENesin  1 tablet Oral BID  . enoxaparin (LOVENOX) injection  30 mg Subcutaneous Q24H  . gabapentin  300 mg Oral TID  . hydrALAZINE  50 mg Oral TID  . insulin aspart  0-5 Units Subcutaneous QHS    . insulin aspart  0-9 Units Subcutaneous TID WC  . ipratropium-albuterol  3 mL Nebulization BID  . latanoprost  1 drop Both Eyes QHS  . pantoprazole  40 mg Oral Daily  . sodium chloride HYPERTONIC  4 mL Nebulization Daily  . timolol  1 drop Right Eye BID   Continuous Infusions: . sodium chloride 100 mL/hr at 04/25/16 0514    LOS: 2 days   Kerney Elbe, DO [facility] Pager [phone]  If 7PM-7AM, please contact night-coverage [email or web address] Password [facility] 04/25/2016,  3:07 PM

## 2016-04-25 NOTE — Care Management Note (Signed)
Case Management Note  Patient Details  Name: Arthor Gorter MRN: [id number] Date of Birth: 1965/06/25  Subjective/Objective:  50 y/o m admitted w/CAP. From home.                   Action/Plan:d/c plan home.   Expected Discharge Date:                  Expected Discharge Plan:  Home/Self Care  In-House Referral:     Discharge planning Services  CM Consult  Post Acute Care Choice:    Choice offered to:     DME Arranged:    DME Agency:     HH Arranged:    HH Agency:     Status of Service:  In process, will continue to follow  If discussed at Long Length of Stay Meetings, dates discussed:    Additional Comments:  Dessa Phi, RN 04/25/2016, 2:46 PM

## 2016-04-25 NOTE — Progress Notes (Signed)
Initial Nutrition Assessment  DOCUMENTATION CODES:   Not applicable  INTERVENTION:  - Will order Premier Protein BID, each supplement provides 160 kcal and 30 grams of protein.  - Continue to encourage PO intakes of meals and supplements. - RD will continue to monitor for additional nutrition-related needs.  NUTRITION DIAGNOSIS:   Inadequate oral intake related to poor appetite, other (see comment) (food preferences) as evidenced by per patient/family report.  GOAL:   Patient will meet greater than or equal to 90% of their needs  MONITOR:   PO intake, Supplement acceptance, Weight trends, Labs, Skin, I & O's  REASON FOR ASSESSMENT:   Malnutrition Screening Tool  ASSESSMENT:   50 y.o. male with medical history significant of hypertension, stage 3 CKD, diabetes mellitus, chf, came in for sob, cough, fever and chills for 4 to 5 days. On arrival to ED, he was found to be afebrile, wbc count around 3.8. CXR reveals right sided pneumonia. He was referred to medical service for admission for management of CAP.   Pt seen for MST. BMI indicates overweight/borderline obese. No intakes documented since admission. Pt reports he began having a poor appetite 52 years PTA and that it continues at this time. He also states that he does not like the food here which is making poor appetite worse. He states that he ate <50% of lunch today which consisted of a hamburger, [place] fries, and a fruit cup. He denies abdominal pain or nausea since admission or PTA.   He reports that he has had an intermittent poor appetite x52 years. He states that this will usually last for a few days at a time and that if it persists for greater than 1.5 weeks he knows "that something else is going on." Pt states that he may eat a bowl of fruit or a bowl of oatmeal at home and that this will be all he consumes for the full day. He was drinking water and occasional Sprite Zero PTA.   He states CBGs are usually 95-151  mg/dL and that he tries to maintain them in the 105-115 mg/dL range. Pt reports that he is off insulin and only taking Metformin and that he is also trying to get off of this medication. He states that he does not check CBGs consistently and often has days where he does not check at all; he only checks if he is not feeling well.   Physical assessment shows no muscle or fat wasting. Pt reports UBW of 235 lbs but does not known the last time he weighed this. He reports he knows his weight is fluctuating based on how his clothes are fitting. CBW indicates that pt has lost 15 lbs (6.4% body weight) from UBW in an unknown time frame.   Medications reviewed; 30 mL GI cocktail x1 dose today, sliding scale Novolog, PRN Zofran, 40 mg oral Protonix/day, PRN oral Phenergan. Labs reviewed; CBGs: 137 and 152 mg/dL, BUN: 36 mg/dL, creatinine: 3.85 mg/dl, Ca: 8 mg/dL, Alk Phos elevated and trending up, GFR: 19 mL/min.   IVF: NS @ 100 mmol/L.    Diet Order:  Diet Carb Modified Fluid consistency: Thin; Room service appropriate? Yes  Skin:  Wound (see comment) (L foot DM ulcer)  Last BM:  12/8 (PTA)  Height:   Ht Readings from Last 1 Encounters:  04/23/16 6' (1.829 m)    Weight:   Wt Readings from Last 1 Encounters:  04/23/16 220 lb 1.6 oz (99.8 kg)    Ideal Body  Weight:  80.91 kg  BMI:  Body mass index is 29.85 kg/m.  Estimated Nutritional Needs:   Kcal:  6389-3734 (17-19 kcal/kg)  Protein:  97-105 grams (1.2-1.3 grams/kg IBW)  Fluid:  1.8 L/day  EDUCATION NEEDS:   No education needs identified at this time    Jarome Matin, MS, RD, LDN, CNSC Inpatient Clinical Dietitian Pager # [phone] After hours/weekend pager # [phone]

## 2016-04-25 NOTE — Consult Note (Signed)
[facility] Nurse wound consult note Reason for Consult:Full thickness (chronic) wound followed by Dr. Sharol Given in the community.  Last seen Monday, December 4 by Dr. Sharol Given. Wound type:neuropathic Pressure Ulcer POA: No Measurement: 2cm x 1.2cm x 0.5cm  Wound AYO:KHTX pink, moist, soft with trimmed callous at the periphery Drainage (amount, consistency, odor) small amount light yellow exudate on old dressing Periwound:See above.  Also noted is evidence of previous wound healing Dressing procedure/placement/frequency:I will provide Nursing with guidance via the Orders for silver hydrofiber (Aquacel Ag+) to the wound bed and covering with dry gauze topper/tape with daily changes. Patient education reinforced about offloading:  Patient reports that he has been given a pressure redistribution shoe, but only wears it intermittently as he believes the shoes create new open areas.  Encouraged to discuss with Dr. Sharol Given at next appointment. [facility] nursing team will not follow, but will remain available to this patient, the nursing and medical teams.  Please re-consult if needed. Thanks, Maudie Flakes, MSN, RN, [place], Arther Abbott  Pager# [phone]

## 2016-04-25 NOTE — Progress Notes (Signed)
Taken neb at this time no distress noted.

## 2016-04-25 NOTE — Progress Notes (Addendum)
Inpatient Diabetes Program Recommendations  AACE/ADA: New Consensus Statement on Inpatient Glycemic Control (2015)  Target Ranges:  Prepandial:   less than 140 mg/dL      Peak postprandial:   less than 180 mg/dL (1-2 hours)      Critically ill patients:  140 - 180 mg/dL   Results for IVER, MIKLAS (MRN [id number]) as of 04/25/2016 12:15  Ref. Range 09/06/2014 05:36 04/23/2016 15:00  Hemoglobin A1C Latest Ref Range: 4.8 - 5.6 % 10.7 (H) 6.7 (H) 4% decrease, now below 7%   Results for CALIL, AMOR (MRN [id number]) as of 04/25/2016 12:15  Ref. Range 04/24/2016 11:32 04/24/2016 17:32 04/24/2016 21:22 04/25/2016 07:57 04/25/2016 11:39  Glucose-Capillary Latest Ref Range: 65 - 99 mg/dL 92 191 (H) 113 (H) 137 (H) 152 (H)   Review of Glycemic Control  Diabetes history:       DM2, CKD stage 3, Obesity Outpatient Diabetes medications:       Novolog 70/30 20 units BID, Metformin Current orders for Inpatient glycemic control:       Novolog 0-9 units TIDAC and 0-5 units QHS   Inpatient Diabetes Program Recommendations:        Noted that CBG's under control on current plan of care which indicates that medical management is currently possibly being limited by currently smoking QD, diet, and exercise.  Thank you,  Windy Carina, RN, BSN Diabetes Coordinator Inpatient Diabetes Program [phone] (Team Pager)

## 2016-04-26 ENCOUNTER — Inpatient Hospital Stay (HOSPITAL_COMMUNITY): Payer: Medicaid Other

## 2016-04-26 DIAGNOSIS — R748 Abnormal levels of other serum enzymes: Secondary | ICD-10-CM

## 2016-04-26 LAB — COMPREHENSIVE METABOLIC PANEL
ALT: 34 U/L (ref 17–63)
ANION GAP: 8 (ref 5–15)
AST: 39 U/L (ref 15–41)
Albumin: 2.7 g/dL — ABNORMAL LOW (ref 3.5–5.0)
Alkaline Phosphatase: 686 U/L — ABNORMAL HIGH (ref 38–126)
BILIRUBIN TOTAL: 0.7 mg/dL (ref 0.3–1.2)
BUN: 37 mg/dL — ABNORMAL HIGH (ref 6–20)
CO2: 20 mmol/L — ABNORMAL LOW (ref 22–32)
Calcium: 7.9 mg/dL — ABNORMAL LOW (ref 8.9–10.3)
Chloride: 109 mmol/L (ref 101–111)
Creatinine, Ser: 3.71 mg/dL — ABNORMAL HIGH (ref 0.61–1.24)
GFR calc Af Amer: 20 mL/min — ABNORMAL LOW (ref >60)
GFR, EST NON AFRICAN AMERICAN: 17 mL/min — AB (ref >60)
Glucose, Bld: 133 mg/dL — ABNORMAL HIGH (ref 65–99)
POTASSIUM: 4.3 mmol/L (ref 3.5–5.1)
Sodium: 137 mmol/L (ref 135–145)
Total Protein: 6.2 g/dL — ABNORMAL LOW (ref 6.5–8.1)

## 2016-04-26 LAB — CBC WITH DIFFERENTIAL/PLATELET
BASOS PCT: 1 %
Basophils Absolute: 0 10*3/uL (ref 0.0–0.1)
EOS PCT: 5 %
Eosinophils Absolute: 0.2 10*3/uL (ref 0.0–0.7)
HCT: 34.3 % — ABNORMAL LOW (ref 39.0–52.0)
HEMOGLOBIN: 11.4 g/dL — AB (ref 13.0–17.0)
Lymphocytes Relative: 26 %
Lymphs Abs: 1 10*3/uL (ref 0.7–4.0)
MCH: 31.6 pg (ref 26.0–34.0)
MCHC: 33.2 g/dL (ref 30.0–36.0)
MCV: 95 fL (ref 78.0–100.0)
Monocytes Absolute: 0.4 10*3/uL (ref 0.1–1.0)
Monocytes Relative: 10 %
NEUTROS ABS: 2.4 10*3/uL (ref 1.7–7.7)
Neutrophils Relative %: 58 %
Platelets: 167 10*3/uL (ref 150–400)
RBC: 3.61 MIL/uL — ABNORMAL LOW (ref 4.22–5.81)
RDW: 12.8 % (ref 11.5–15.5)
WBC: 4 10*3/uL (ref 4.0–10.5)

## 2016-04-26 LAB — MAGNESIUM: MAGNESIUM: 1.9 mg/dL (ref 1.7–2.4)

## 2016-04-26 LAB — GLUCOSE, CAPILLARY
GLUCOSE-CAPILLARY: 136 mg/dL — AB (ref 65–99)
GLUCOSE-CAPILLARY: 162 mg/dL — AB (ref 65–99)
Glucose-Capillary: 140 mg/dL — ABNORMAL HIGH (ref 65–99)
Glucose-Capillary: 119 mg/dL — ABNORMAL HIGH (ref 65–99)

## 2016-04-26 LAB — PHOSPHORUS: Phosphorus: 3.6 mg/dL (ref 2.5–4.6)

## 2016-04-26 MED ORDER — MAGIC MOUTHWASH W/LIDOCAINE
15.0000 mL | Freq: Four times a day (QID) | ORAL | Status: DC | PRN
  Filled 2016-04-26: qty 15

## 2016-04-26 NOTE — Progress Notes (Addendum)
PROGRESS NOTE    Cody Webster  JZP:[id number] DOB: [birth year] DOA: 04/23/2016 PCP: Kerin Perna, NP   Brief Narrative:  Cody Webster is a 50 y.o. male with medical history significant of hypertension, stage 3 CKD, Diabetes Mellitus, CHF, came in for sob, cough, fever and chills for 4 to 5 days. On arrival to ED, he was found to be afebrile, wbc count around 3.8. CXR reveals right sided pneumonia. He was referred to medical service for admission for management of RML and RLL CAP from Strep Pneumo.   Assessment & Plan:   Active Problems:   Diabetic foot ulcer ([place])   HTN (hypertension)   Peripheral neuropathy ([facility])   Acute-on-chronic kidney injury ([place])   Type I diabetes mellitus with complication, uncontrolled ([place])   AKI (acute kidney injury) ([place])   CAP (community acquired pneumonia)  RML and RLL Strep Pneumo Community Acquired Pneumonia, poA -CXR showed Infiltrate is seen in the right mid and lower lung in a perihilar distribution. No other interval changes or acute abnormalities -C/w IV Rocephin and IV Zithromax.  -Blood Cultures x2 showed No Growth at 1 Day and Sputum cultures ordered.  -Urine for Strep Pneumonia Positive.  -[place] oxygen to keep sats greater than 90%. -DuoNeb q6h changed to BID; C/w Hypertonic Saline Nebs Today.  -Influenza PCR Negative -Pain Control with Tramadol, Norco and Dilaudid. Acetaminophen for Fever, Headache, Mild Pain -Zofran and Phenergan for Nausea -WBC went from 3.8 -> 3.0 -> 3.8 -> 4.0 -C/w NS at 100 mL/hr -Repeat CXR in AM  Chest pain on coughing and deep inspiration.  -Pleurisy. Pain control as above.  -Troponin negative.  -BNP negative.   AKI on CKD stage 3: -Patient's BUN/Cr improved to 37/3.71 from 36/3.85 -Suspect from dehydration.  -C/w IVF Rehydration with NS at 100 mL/hr -Retroperitoneal U/S showed Negative for hydronephrosis. Increased renal parenchymal echogenicity, consistent with medical renal disease. -U/A  Negative  Diabetes Mellitus Type 2 -Held Metformin -Repeat HbA1c was 6.7 -C/w Sensitive Novolog SSI ACHS -C/w Monitoring CBG's; Have ranged from 136-162 -C/w Gabapentin 300 mg TID  Left Foot Diabetic Ulcer with Necrosis of Muscle  -Does not appear to be infected, pt reports it has been there for one year.  -Wound care consult.  -C/w Dressing twice daily.  -X ray of the left foot showed No acute fracture or subluxation. No radiopaque foreign body or soft tissue gas. There is mild soft tissue swelling in the region of the metatarsophalangeal joints.   Glaucoma -C/w Timolol 0.5% Opthalmic Solution 1 drop Right Eye BID -C/w Lantaprost 0.005% Opthalmic Solution 1 drop Both Eyes qHS -C/w Liquifilm Tears 1 drop both Eyes TIDprn -Patient Takes Brinzolamide-Bimonidine 1-0.2% 1 Drop in Right Eye BID but it is not on Formulary; Will need to take Own MED and Have asked Pharmacy to help.   Hypertension -BP has been ranging 113-176 and DBP ranged from 78-84 -HCTZ held because of AKI  -C/w Clonidine 0.2 mg po BID, Carvedilol 25 mg po BID, and Hydralazine 50 mg TID  Isolated Elevated Alk Phos - ? EtOH Abuse vs. Sepsis -Patient's Alk Phos was 673 -> 718 -> 686 -RUQ Ultrasound with evaluation of Biliary Tree showed No focal lesion identified. Within normal limits in parenchymal echogenicity. No acute process or explanation for elevated liver function tests. -Bilirubin 0.7, AST 39, ALT 34 -GGT was 910 -May need GI evaluation as an Outpatient -Continue to Monitor and Repeat CMP in AM and Liver Panel  GERD/Indigestion -Patient was given GI Cocktail  30 mL once yesterday -C/w PPI Pantoprazole 40 mg po Daily  DVT prophylaxis: Lovenox sq 30 mg Code Status: FULL CODE Family Communication: Friend at bedside Disposition Plan: Likely Home D/C in AM  Consultants:   None  Procedures: None  Antimicrobials: IV Azithromycin and IV Ceftriaxone  Subjective: Seen and examined and stated he felt a  lot better. No N/V and SOB improved. No Cp or Abdominal Pain. No other concerns or complaints at this time.  Objective: Vitals:   04/25/16 2010 04/25/16 2019 04/26/16 0512 04/26/16 1300  BP: 113/78  (!) 176/84 134/72  Pulse: 79 81 81 79  Resp: '16 16 17 18  ' Temp: 98.1 F (36.7 C)  98.3 F (36.8 C) 98.3 F (36.8 C)  TempSrc: Oral  Oral Oral  SpO2: 96% 97% 94% 97%  Weight:      Height:        Intake/Output Summary (Last 24 hours) at 04/26/16 1537 Last data filed at 04/26/16 0800  Gross per 24 hour  Intake             1450 ml  Output             1400 ml  Net               50 ml   Filed Weights   04/23/16 1949  Weight: 99.8 kg (220 lb 1.6 oz)   Examination: Physical Exam:  Constitutional: WN/WD, NAD and appears calm and comfortable Eyes: Lids and conjunctivae normal, sclerae anicteric  ENMT: External Ears, Nose appear normal. Grossly normal hearing.  Neck: Appears normal, supple, no cervical masses, normal ROM, no appreciable thyromegaly, no JVD Respiratory: Diminished Right Lung with rhonchi. No wheezing, rales, or crackles. Normal respiratory effort and patient is not tachypenic. No accessory muscle use.  Cardiovascular: RRR, no murmurs / rubs / gallops. S1 and S2 auscultated. No extremity edema.  Abdomen: Soft, non-tender, non-distended. No masses palpated. No appreciable hepatosplenomegaly. Bowel sounds positive x4.  GU: Deferred. Musculoskeletal: No clubbing / cyanosis of digits/nails. No joint deformity upper and lower extremities. No contractures. Normal strength and muscle tone.  Skin: Left Foot Ulcer noted and did not appear infected.  Neurologic: CN 2-12 grossly intact with no focal deficits. Sensation intact in all 4 Extremities. Strength 5/5 in all 4. Romberg sign cerebellar reflexes not assessed.  Psychiatric: Normal judgment and insight. Alert and oriented x 3. Normal mood and appropriate affect.   Data Reviewed: I have personally reviewed following labs and  imaging studies  CBC:  Recent Labs Lab 04/23/16 1640 04/24/16 0518 04/25/16 0518 04/26/16 0531  WBC 3.8* 3.0* 3.8* 4.0  NEUTROABS 1.9  --  1.9 2.4  HGB 11.6* 11.5* 12.7* 11.4*  HCT 34.9* 34.0* 38.1* 34.3*  MCV 94.8 93.2 95.0 95.0  PLT 160 142* 168 470   Basic Metabolic Panel:  Recent Labs Lab 04/23/16 1500 04/24/16 0518 04/25/16 0518 04/26/16 0531  NA 134* 135 137 137  K 4.0 3.9 4.4 4.3  CL 105 107 108 109  CO2 21* 21* 23 20*  GLUCOSE 197* 157* 109* 133*  BUN 35* 35* 36* 37*  CREATININE 4.24* 4.04* 3.85* 3.71*  CALCIUM 8.0* 7.6* 8.0* 7.9*  MG  --   --  1.8 1.9  PHOS  --   --  4.1 3.6   GFR: Estimated Creatinine Clearance: 28.5 mL/min (by C-G formula based on SCr of 3.71 mg/dL (H)). Liver Function Tests:  Recent Labs Lab 04/24/16 0518 04/25/16 0518 04/26/16 0531  AST 26 35 39  ALT 31 32 34  ALKPHOS 673* 718* 686*  BILITOT 0.6 0.6 0.7  PROT 6.1* 6.5 6.2*  ALBUMIN 2.6* 2.7* 2.7*   No results for input(s): LIPASE, AMYLASE in the last 168 hours. No results for input(s): AMMONIA in the last 168 hours. Coagulation Profile: No results for input(s): INR, PROTIME in the last 168 hours. Cardiac Enzymes: No results for input(s): CKTOTAL, CKMB, CKMBINDEX, TROPONINI in the last 168 hours. BNP (last 3 results) No results for input(s): PROBNP in the last 8760 hours. HbA1C: No results for input(s): HGBA1C in the last 72 hours. CBG:  Recent Labs Lab 04/25/16 1139 04/25/16 1557 04/25/16 2025 04/26/16 0805 04/26/16 1115  GLUCAP 152* 148* 131* 136* 162*   Lipid Profile: No results for input(s): CHOL, HDL, LDLCALC, TRIG, CHOLHDL, LDLDIRECT in the last 72 hours. Thyroid Function Tests: No results for input(s): TSH, T4TOTAL, FREET4, T3FREE, THYROIDAB in the last 72 hours. Anemia Panel: No results for input(s): VITAMINB12, FOLATE, FERRITIN, TIBC, IRON, RETICCTPCT in the last 72 hours. Sepsis Labs:  Recent Labs Lab 04/23/16 1636  LATICACIDVEN 1.27     Recent Results (from the past 240 hour(s))  Culture, blood (Routine X 2) w Reflex to ID Panel     Status: None (Preliminary result)   Collection Time: 04/24/16  8:56 PM  Result Value Ref Range Status   Specimen Description BLOOD LEFT ANTECUBITAL  Final   Special Requests IN PEDIATRIC BOTTLE 2CC  Final   Culture   Final    NO GROWTH 1 DAY Performed at [facility]    Report Status PENDING  Incomplete  Culture, blood (Routine X 2) w Reflex to ID Panel     Status: None (Preliminary result)   Collection Time: 04/24/16  8:56 PM  Result Value Ref Range Status   Specimen Description BLOOD LEFT HAND  Final   Special Requests BOTTLES DRAWN AEROBIC AND ANAEROBIC 5CC  Final   Culture   Final    NO GROWTH 1 DAY Performed at [facility]    Report Status PENDING  Incomplete     Radiology Studies: Dg Chest 2 View  Result Date: 04/25/2016 CLINICAL DATA:  Pneumonia. EXAM: CHEST  2 VIEW COMPARISON:  04/23/2016 FINDINGS: There has been slight improvement in the faint infiltrates in the right mid and lower lung zones. Peribronchial thickening process. Left lung is clear. Heart size and vascularity are normal. No effusions. No bone abnormality. IMPRESSION: Slight improvement in the hazy pneumonia is in the right lung. Electronically Signed   By: Lorriane Shire M.D.   On: 04/25/2016 08:39   US Abdomen Limited Ruq  Result Date: 04/26/2016 CLINICAL DATA:  Elevated liver function tests. EXAM: US ABDOMEN LIMITED - RIGHT UPPER QUADRANT COMPARISON:  Renal ultrasound 04/23/2016. FINDINGS: Gallbladder: No gallstones or wall thickening visualized. No sonographic Murphy sign noted by sonographer. Common bile duct: Diameter: Normal, 3 mm. Liver: No focal lesion identified. Within normal limits in parenchymal echogenicity. IMPRESSION: No acute process or explanation for elevated liver function tests. Electronically Signed   By: Abigail Miyamoto M.D.   On: 04/26/2016 10:12   Scheduled Meds: .  azithromycin  500 mg Intravenous Q24H  . carvedilol  25 mg Oral BID WC  . cefTRIAXone (ROCEPHIN)  IV  1 g Intravenous Q24H  . cloNIDine  0.2 mg Oral BID  . dextromethorphan-guaiFENesin  1 tablet Oral BID  . enoxaparin (LOVENOX) injection  30 mg Subcutaneous Q24H  . gabapentin  300 mg  Oral TID  . hydrALAZINE  50 mg Oral TID  . insulin aspart  0-5 Units Subcutaneous QHS  . insulin aspart  0-9 Units Subcutaneous TID WC  . ipratropium-albuterol  3 mL Nebulization BID  . latanoprost  1 drop Both Eyes QHS  . nystatin  5 mL Oral QID  . pantoprazole  40 mg Oral Daily  . protein supplement shake  11 oz Oral BID BM  . sodium chloride HYPERTONIC  4 mL Nebulization Daily  . timolol  1 drop Right Eye BID   Continuous Infusions: . sodium chloride 100 mL/hr at 04/25/16 0514    LOS: 3 days   Kerney Elbe, DO [facility] Pager [phone]  If 7PM-7AM, please contact night-coverage [email or web address] Password [facility] 04/26/2016, 3:37 PM

## 2016-04-27 DIAGNOSIS — E108 Type 1 diabetes mellitus with unspecified complications: Secondary | ICD-10-CM

## 2016-04-27 DIAGNOSIS — N183 Chronic kidney disease, stage 3 (moderate): Secondary | ICD-10-CM

## 2016-04-27 DIAGNOSIS — N179 Acute kidney failure, unspecified: Secondary | ICD-10-CM

## 2016-04-27 DIAGNOSIS — E1065 Type 1 diabetes mellitus with hyperglycemia: Secondary | ICD-10-CM

## 2016-04-27 DIAGNOSIS — J154 Pneumonia due to other streptococci: Secondary | ICD-10-CM

## 2016-04-27 LAB — COMPREHENSIVE METABOLIC PANEL
ALK PHOS: 662 U/L — AB (ref 38–126)
ALT: 38 U/L (ref 17–63)
AST: 41 U/L (ref 15–41)
Albumin: 2.6 g/dL — ABNORMAL LOW (ref 3.5–5.0)
Anion gap: 5 (ref 5–15)
BUN: 32 mg/dL — ABNORMAL HIGH (ref 6–20)
CALCIUM: 8.2 mg/dL — AB (ref 8.9–10.3)
CO2: 21 mmol/L — ABNORMAL LOW (ref 22–32)
CREATININE: 3.02 mg/dL — AB (ref 0.61–1.24)
Chloride: 111 mmol/L (ref 101–111)
GFR calc Af Amer: 26 mL/min — ABNORMAL LOW (ref >60)
GFR, EST NON AFRICAN AMERICAN: 22 mL/min — AB (ref >60)
Glucose, Bld: 138 mg/dL — ABNORMAL HIGH (ref 65–99)
Potassium: 4.1 mmol/L (ref 3.5–5.1)
Sodium: 137 mmol/L (ref 135–145)
Total Bilirubin: 0.3 mg/dL (ref 0.3–1.2)
Total Protein: 6.2 g/dL — ABNORMAL LOW (ref 6.5–8.1)

## 2016-04-27 LAB — CBC WITH DIFFERENTIAL/PLATELET
Basophils Absolute: 0 10*3/uL (ref 0.0–0.1)
Basophils Relative: 1 %
EOS PCT: 8 %
Eosinophils Absolute: 0.3 10*3/uL (ref 0.0–0.7)
HEMATOCRIT: 34.3 % — AB (ref 39.0–52.0)
Hemoglobin: 11.5 g/dL — ABNORMAL LOW (ref 13.0–17.0)
Lymphocytes Relative: 29 %
Lymphs Abs: 1.1 10*3/uL (ref 0.7–4.0)
MCH: 31.5 pg (ref 26.0–34.0)
MCHC: 33.5 g/dL (ref 30.0–36.0)
MCV: 94 fL (ref 78.0–100.0)
Monocytes Absolute: 0.4 10*3/uL (ref 0.1–1.0)
Monocytes Relative: 10 %
Neutro Abs: 2 10*3/uL (ref 1.7–7.7)
Neutrophils Relative %: 52 %
PLATELETS: 179 10*3/uL (ref 150–400)
RBC: 3.65 MIL/uL — AB (ref 4.22–5.81)
RDW: 12.5 % (ref 11.5–15.5)
WBC: 3.8 10*3/uL — AB (ref 4.0–10.5)

## 2016-04-27 LAB — GLUCOSE, CAPILLARY
Glucose-Capillary: 198 mg/dL — ABNORMAL HIGH (ref 65–99)
Glucose-Capillary: 137 mg/dL — ABNORMAL HIGH (ref 65–99)

## 2016-04-27 LAB — MAGNESIUM: MAGNESIUM: 1.7 mg/dL (ref 1.7–2.4)

## 2016-04-27 LAB — PHOSPHORUS: Phosphorus: 2.8 mg/dL (ref 2.5–4.6)

## 2016-04-27 MED ORDER — POLYETHYLENE GLYCOL 3350 17 G PO PACK: 17.0000 g | PACK | Freq: Every day | ORAL | 0 refills | Status: DC

## 2016-04-27 MED ORDER — GABAPENTIN 400 MG PO CAPS: 700.0000 mg | ORAL_CAPSULE | Freq: Every day | ORAL | Status: DC

## 2016-04-27 MED ORDER — FAMOTIDINE 20 MG PO TABS: 20.0000 mg | ORAL_TABLET | Freq: Two times a day (BID) | ORAL | 0 refills | Status: DC

## 2016-04-27 MED ORDER — SENNA 8.6 MG PO CAPS: 2.0000 | ORAL_CAPSULE | Freq: Every day | ORAL | 0 refills | Status: DC

## 2016-04-27 MED ORDER — NYSTATIN 100000 UNIT/ML MT SUSP: 5.0000 mL | Freq: Four times a day (QID) | OROMUCOSAL | 0 refills | Status: DC

## 2016-04-27 MED ORDER — TRAMADOL HCL 50 MG PO TABS
100.0000 mg | ORAL_TABLET | Freq: Two times a day (BID) | ORAL | Status: DC | PRN
  Administered 2016-04-27: 100 mg via ORAL
  Filled 2016-04-27: qty 2

## 2016-04-27 MED ORDER — AMOXICILLIN-POT CLAVULANATE 500-125 MG PO TABS: 1.0000 | ORAL_TABLET | Freq: Two times a day (BID) | ORAL | 0 refills | Status: DC

## 2016-04-27 MED ORDER — GABAPENTIN 300 MG PO CAPS: 600.0000 mg | ORAL_CAPSULE | Freq: Every day | ORAL | 0 refills | Status: DC

## 2016-04-27 MED ORDER — AMOXICILLIN-POT CLAVULANATE 875-125 MG PO TABS
1.0000 | ORAL_TABLET | Freq: Two times a day (BID) | ORAL | Status: DC
  Administered 2016-04-27: 1 via ORAL
  Filled 2016-04-27: qty 1

## 2016-04-27 MED ORDER — BISACODYL 10 MG RE SUPP: 10.0000 mg | RECTAL | 0 refills | Status: DC | PRN

## 2016-04-27 MED ORDER — HYDROMORPHONE HCL 2 MG PO TABS
2.0000 mg | ORAL_TABLET | ORAL | Status: DC | PRN
  Administered 2016-04-27: 2 mg via ORAL
  Filled 2016-04-27: qty 1

## 2016-04-27 MED ORDER — FAMOTIDINE 20 MG PO TABS: 20.0000 mg | ORAL_TABLET | ORAL | 0 refills | Status: DC

## 2016-04-27 NOTE — Discharge Instructions (Signed)
Community-Acquired Pneumonia, Adult °Introduction °Pneumonia is an infection of the lungs. One type of pneumonia can happen while a person is in a hospital. A different type can happen when a person is not in a hospital (community-acquired pneumonia). It is easy for this kind to spread from person to person. It can spread to you if you breathe near an infected person who coughs or sneezes. Some symptoms include: °· A dry cough. °· A wet (productive) cough. °· Fever. °· Sweating. °· Chest pain. °Follow these instructions at home: °· Take over-the-counter and prescription medicines only as told by your doctor. °¨ Only take cough medicine if you are losing sleep. °¨ If you were prescribed an antibiotic medicine, take it as told by your doctor. Do not stop taking the antibiotic even if you start to feel better. °· Sleep with your head and neck raised (elevated). You can do this by putting a few pillows under your head, or you can sleep in a recliner. °· Do not use tobacco products. These include cigarettes, chewing tobacco, and e-cigarettes. If you need help quitting, ask your doctor. °· Drink enough water to keep your pee (urine) clear or pale yellow. °A shot (vaccine) can help prevent pneumonia. Shots are often suggested for: °· People older than 52 years of age. °· People older than 52 years of age: °¨ Who are having cancer treatment. °¨ Who have long-term (chronic) lung disease. °¨ Who have problems with their body's defense system (immune system). °You may also prevent pneumonia if you take these actions: °· Get the flu (influenza) shot every year. °· Go to the dentist as often as told. °· Wash your hands often. If soap and water are not available, use hand sanitizer. °Contact a doctor if: °· You have a fever. °· You lose sleep because your cough medicine does not help. °Get help right away if: °· You are Allona Gondek of breath and it gets worse. °· You have more chest pain. °· Your sickness gets worse. This is very  serious if: °¨ You are an older adult. °¨ Your body's defense system is weak. °· You cough up blood. °This information is not intended to replace advice given to you by your health care provider. Make sure you discuss any questions you have with your health care provider. °Document Released: 10/19/2007 Document Revised: 10/08/2015 Document Reviewed: 08/27/2014 °© 2017 Elsevier ° °

## 2016-04-27 NOTE — Care Management Note (Signed)
Case Management Note  Patient Details  Name: Cody Webster MRN: [id number] Date of Birth: 25-Apr-1966  Subjective/Objective:                    Action/Plan:d/c home.   Expected Discharge Date:                  Expected Discharge Plan:  Home/Self Care  In-House Referral:     Discharge planning Services  CM Consult  Post Acute Care Choice:    Choice offered to:     DME Arranged:    DME Agency:     HH Arranged:    [place] Agency:     Status of Service:  Completed, signed off  If discussed at [employer] of Stay Meetings, dates discussed:    Additional Comments:  Dessa Phi, RN 04/27/2016, 3:41 PM

## 2016-04-27 NOTE — Discharge Summary (Addendum)
Physician Discharge Summary  Cody Webster TDV:[id number] DOB: 1965/12/16 DOA: 04/23/2016  PCP: Kerin Perna, NP  Admit date: 04/23/2016 Discharge date: 04/27/2016  Admitted From: Home  Disposition:  Home  Recommendations for Outpatient Follow-up:  1. Follow up with PCP in 1-2 weeks, f/u blood pressure 2. Please obtain BMP/CBC in one week 3. Please follow up on the following pending results:  Blood cultures  Home Health:  None  Equipment/Devices:  None  Discharge Condition:  Stable, improved CODE STATUS:  Full code  Diet recommendation:  Healthy heart   Brief/Interim Summary:  Cody Brinsonis a 50 y.o.malewith medical history significant of hypertension, stage 3 CKD, Diabetes Mellitus, CHF, came in for sob, cough, fever and chills for 4 to 5 days. On arrival to ED, he was found to be afebrile, wbc count around 3.8. CXR revealed right sided pneumonia. He was referred to medical service for admission for management of RML and RLL CAP from Strep Pneumo.  He was initially placed on pressors demand biotic for community-acquired pneumonia, however, after his urine test was positive for strep pneumo his antibiotics were narrowed to Augmentin.  He had gradual improvement in his dyspnea and body aches. The date of discharge, he felt ready to go home and felt that he can complete his ADLs without severe dyspnea. His oxygen saturations remained greater than 88% on room air. His blood cultures remained no growth to date. Influenza PCR was negative.  I reviewed patient's medication list indeterminate several of his home medications were not appropriately renally dosed. His medication list was reviewed with the pharmacist and several changes were made at time of discharge. These changes included discontinuing the patient's metformin and renally dosing his gabapentin. These changes were discussed with the patient who voiced understanding.  Advised him to continue to seek the advice of his  nephrologist Dr. Moshe Cipro when any medication changes are made.   Discharge Diagnoses:  Principal Problem:   Streptococcal pneumonia ([place]) Active Problems:   Diabetic foot ulcer ([place])   HTN (hypertension)   Peripheral neuropathy ([place])   Acute-on-chronic kidney injury ([place])   Type I diabetes mellitus with complication, uncontrolled ([place])   AKI (acute kidney injury) ([place])   CAP (community acquired pneumonia)  RML and RLL Strep Pneumo Community Acquired Pneumonia, poA -CXR showed Infiltrate is seen in the right mid and lower lung in a perihilar distribution. No other interval changes or acute abnormalities -Blood Cultures x2 NGTD -Urine for Strep Pneumonia Positive.  - transitioned to augmentin at discharge to complete a 7-day course -Influenza PCR Negative  Chest pain on coughing and deep inspiration.  Improved with treatment of pneumonia.  -Troponin negative.  -BNP negative.   AKI on CKD stage 3, creatinine trended down from 4.24 to 3.02 which is near his baseline of 2.5-3 with IV fluids. - RUS negative for hydronephrosis. Increased renal parenchymal echogenicity, consistent with medical renal disease. -U/A Negative  Diabetes Mellitus Type 2.  Discontinued metformin due to CKD. -Repeat HbA1c was 6.7 - given SSI during hospitalization and resumed 70/30 insulin at discharge.   Diabetic neuropathy - renally dose gabapentin  Left Foot Diabetic Ulcer with Necrosis of Muscle  -Does not appear to be infected, pt reports it has been there for one year.  -Wound care consulted -C/w Dressing twice daily.  -X ray of the left foot showed No acute fracture or subluxation. No radiopaque foreign body or soft tissue gas. There is mild soft tissue swelling in the region of the metatarsophalangeal joints.  Glaucoma, stable, used formulary alternatives during hospitalization, but resumed home medications at discharge.  Hypertension, BP stable.   -  Discontinued HCTZ due to  severity of CKD -C/w Clonidine 0.2 mg po BID, Carvedilol 25 mg po BID, and Hydralazine 50 mg TID  Isolated Elevated Alk Phos with elevated GGT.  Consider PSC, but RUQ Ultrasound with evaluation of Biliary Tree showed No focal lesion.  Parenchymal echogenicity was within normal limits.  -Bilirubin 0.7, AST 39, ALT 34 -GGT was 910 - GI evaluation as an Outpatient  GERD/Indigestion -Patient was given GI Cocktail 30 mL once yesterday -C/w PPI Pantoprazole 40 mg po Daily  Thrush, continue nystatin until antibiotics are complete  Discharge Instructions  Discharge Instructions    Call MD for:  difficulty breathing, headache or visual disturbances    Complete by:  As directed    Call MD for:  extreme fatigue    Complete by:  As directed    Call MD for:  hives    Complete by:  As directed    Call MD for:  persistant dizziness or light-headedness    Complete by:  As directed    Call MD for:  persistant nausea and vomiting    Complete by:  As directed    Call MD for:  severe uncontrolled pain    Complete by:  As directed    Call MD for:  temperature >100.4    Complete by:  As directed    Diet - low sodium heart healthy    Complete by:  As directed    Diet Carb Modified    Complete by:  As directed    Discharge instructions    Complete by:  As directed    Please stop your metformin and HCTZ.  I have changed your dose of gabapentin.  Take your next dose of antibiotic (Augmentin) tonight and continue taking twice a day until all the tabs are gone.   Increase activity slowly    Complete by:  As directed        Medication List    STOP taking these medications   hydrochlorothiazide 25 MG tablet Commonly known as:  HYDRODIURIL   metFORMIN 500 MG tablet Commonly known as:  GLUCOPHAGE     TAKE these medications   amoxicillin-clavulanate 500-125 MG tablet Commonly known as:  AUGMENTIN Take 1 tablet (500 mg total) by mouth 2 (two) times daily.   bisacodyl 10 MG  suppository Commonly known as:  DULCOLAX Place 1 suppository (10 mg total) rectally as needed for moderate constipation.   carvedilol 25 MG tablet Commonly known as:  COREG Take 25 mg by mouth every morning. Reported on 05/27/2015   cloNIDine 0.2 MG tablet Commonly known as:  CATAPRES Take 0.2 mg by mouth 2 (two) times daily.   famotidine 20 MG tablet Commonly known as:  PEPCID Take 1 tablet (20 mg total) by mouth every other day.   gabapentin 300 MG capsule Commonly known as:  NEURONTIN Take 2 capsules (600 mg total) by mouth at bedtime. What changed:  how much to take  when to take this   hydrALAZINE 25 MG tablet Commonly known as:  APRESOLINE Take 1 tablet (25 mg total) by mouth every 8 (eight) hours. What changed:  how much to take  when to take this   HYDROcodone-acetaminophen 10-325 MG tablet Commonly known as:  NORCO Take 1 tablet by mouth 2 (two) times daily as needed for moderate pain.   hydroxypropyl methylcellulose / hypromellose 2.5 %  ophthalmic solution Commonly known as:  ISOPTO TEARS / GONIOVISC Place 1 drop into both eyes 3 (three) times daily as needed for dry eyes.   insulin aspart protamine - aspart (70-30) 100 UNIT/ML FlexPen Commonly known as:  NOVOLOG 70/30 MIX 20 units in the morning & 12 units in the evening   nystatin 100000 UNIT/ML suspension Commonly known as:  MYCOSTATIN Take 5 mLs (500,000 Units total) by mouth 4 (four) times daily.   polyethylene glycol packet Commonly known as:  MIRALAX Take 17 g by mouth daily.   Senna 8.6 MG Caps Take 2 tablets by mouth at bedtime.   SIMBRINZA 1-0.2 % Susp Generic drug:  Brinzolamide-Brimonidine Place 1 drop into the right eye 2 (two) times daily.   timolol 0.5 % ophthalmic solution Commonly known as:  BETIMOL Place 1 drop into the right eye 2 (two) times daily.   Travoprost (BAK Free) 0.004 % Soln ophthalmic solution Commonly known as:  TRAVATAN Place 1 drop into both eyes 2 (two)  times daily.      Follow-up Information    EDWARDS, MICHELLE P, NP. Schedule an appointment as soon as possible for a visit in 2 week(s).   Specialty:  Internal Medicine Contact information: [place] [id number] [phone]          Allergies  Allergen Reactions  . Influenza Vaccines     Chest congestion, fever  . Morphine And Related Shortness Of Breath and Other (See Comments)    Hallucination and shortness of breath Tolerates Norco/Vicodin  . Pneumococcal Vaccines     Nausea, vomiting, fever    Consultations: none   Procedures/Studies: Dg Chest 2 View  Result Date: 04/25/2016 CLINICAL DATA:  Pneumonia. EXAM: CHEST  2 VIEW COMPARISON:  04/23/2016 FINDINGS: There has been slight improvement in the faint infiltrates in the right mid and lower lung zones. Peribronchial thickening process. Left lung is clear. Heart size and vascularity are normal. No effusions. No bone abnormality. IMPRESSION: Slight improvement in the hazy pneumonia is in the right lung. Electronically Signed   By: Lorriane Shire M.D.   On: 04/25/2016 08:39   Dg Chest 2 View  Result Date: 04/23/2016 CLINICAL DATA:  Congestive cough. EXAM: CHEST  2 VIEW COMPARISON:  January 03, 2015 FINDINGS: Infiltrate is seen in the right mid and lower lung in a perihilar distribution. No other interval changes or acute abnormalities. IMPRESSION: Right-sided pneumonia.  Recommend follow-up to resolution. Electronically Signed   By: Dorise Bullion III M.D   On: 04/23/2016 14:48   US Renal  Result Date: 04/23/2016 CLINICAL DATA:  Acute renal failure EXAM: RENAL / URINARY TRACT ULTRASOUND COMPLETE COMPARISON:  None. FINDINGS: Right Kidney: Length: 11.5 cm. Diffusely increased parenchymal echogenicity, consistent with medical renal disease. No hydronephrosis. No focal lesion. Left Kidney: Length: 12.1 cm. Diffusely increased parenchymal echogenicity, consistent with medical renal disease. No  hydronephrosis. No focal lesion. Bladder: Appears normal for degree of bladder distention. IMPRESSION: Negative for hydronephrosis. Increased renal parenchymal echogenicity, consistent with medical renal disease. Electronically Signed   By: Andreas Newport M.D.   On: 04/23/2016 23:36   Dg Foot Complete Left  Result Date: 04/23/2016 CLINICAL DATA:  Ulcer on bottom of foot, at the level of MTP joints x years per pt; EXAM: LEFT FOOT - COMPLETE 3+ VIEW COMPARISON:  12/11/2013, 12/10/2013 FINDINGS: No acute fracture or subluxation. No radiopaque foreign body or soft tissue gas. There is mild soft tissue swelling in the region of the metatarsophalangeal  joints. IMPRESSION: Soft tissue swelling region metatarsophalangeal joints. Otherwise no acute bony abnormality. Electronically Signed   By: Nolon Nations M.D.   On: 04/23/2016 18:45   US Abdomen Limited Ruq  Result Date: 04/26/2016 CLINICAL DATA:  Elevated liver function tests. EXAM: US ABDOMEN LIMITED - RIGHT UPPER QUADRANT COMPARISON:  Renal ultrasound 04/23/2016. FINDINGS: Gallbladder: No gallstones or wall thickening visualized. No sonographic Murphy sign noted by sonographer. Common bile duct: Diameter: Normal, 3 mm. Liver: No focal lesion identified. Within normal limits in parenchymal echogenicity. IMPRESSION: No acute process or explanation for elevated liver function tests. Electronically Signed   By: Abigail Miyamoto M.D.   On: 04/26/2016 10:12    Subjective:  Feeling much better today.  Headache has been coming and going but denies focal numbness, weakness, slurred speech, or vision changes.  Denies neck stiffness.  Breathing is better and muscle aches have improved.  Energy has improved.  Feels ready for discharge.    Discharge Exam: Vitals:   04/27/16 0537 04/27/16 0957  BP: (!) 185/92 (!) 166/86  Pulse: 77   Resp: 15   Temp: 98.3 F (36.8 C)    Vitals:   04/26/16 2300 04/27/16 0537 04/27/16 0957 04/27/16 1039  BP: (!) 166/75 (!)  185/92 (!) 166/86   Pulse: 74 77    Resp:  15    Temp:  98.3 F (36.8 C)    TempSrc:  Oral    SpO2:  96%  97%  Weight:      Height:        General: Pt is alert, awake, not in acute distress HEENT:  Clouding of bilateral irises Cardiovascular: RRR, S1/S2 +, no rubs, no gallops Respiratory: diminished at the right base with course rales heard at the mid back on the right.  No wheezes.   (respiratory exam much more impressive than CXR) Abdominal: Soft, NT, ND, bowel sounds + Extremities: no edema, no cyanosis    The results of significant diagnostics from this hospitalization (including imaging, microbiology, ancillary and laboratory) are listed below for reference.     Microbiology: Recent Results (from the past 240 hour(s))  Culture, blood (Routine X 2) w Reflex to ID Panel     Status: None (Preliminary result)   Collection Time: 04/24/16  8:56 PM  Result Value Ref Range Status   Specimen Description BLOOD LEFT ANTECUBITAL  Final   Special Requests IN PEDIATRIC BOTTLE [place]  Final   Culture   Final    NO GROWTH 1 DAY Performed at [facility]    Report Status PENDING  Incomplete  Culture, blood (Routine X 2) w Reflex to ID Panel     Status: None (Preliminary result)   Collection Time: 04/24/16  8:56 PM  Result Value Ref Range Status   Specimen Description BLOOD LEFT HAND  Final   Special Requests BOTTLES DRAWN AEROBIC AND ANAEROBIC 5CC  Final   Culture   Final    NO GROWTH 1 DAY Performed at [facility]    Report Status PENDING  Incomplete     Labs: BNP (last 3 results)  Recent Labs  04/23/16 1640  BNP 35.4   Basic Metabolic Panel:  Recent Labs Lab 04/23/16 1500 04/24/16 0518 04/25/16 0518 04/26/16 0531 04/27/16 0524  NA 134* 135 137 137 137  K 4.0 3.9 4.4 4.3 4.1  CL 105 107 108 109 111  CO2 21* 21* 23 20* 21*  GLUCOSE 197* 157* 109* 133* 138*  BUN 35* 35* 36*  37* 32*  CREATININE 4.24* 4.04* 3.85* 3.71* 3.02*  CALCIUM 8.0* 7.6* 8.0*  7.9* 8.2*  MG  --   --  1.8 1.9 1.7  PHOS  --   --  4.1 3.6 2.8   Liver Function Tests:  Recent Labs Lab 04/24/16 0518 04/25/16 0518 04/26/16 0531 04/27/16 0524  AST 26 35 39 41  ALT 31 32 34 38  ALKPHOS 673* 718* 686* 662*  BILITOT 0.6 0.6 0.7 0.3  PROT 6.1* 6.5 6.2* 6.2*  ALBUMIN 2.6* 2.7* 2.7* 2.6*   No results for input(s): LIPASE, AMYLASE in the last 168 hours. No results for input(s): AMMONIA in the last 168 hours. CBC:  Recent Labs Lab 04/23/16 1640 04/24/16 0518 04/25/16 0518 04/26/16 0531 04/27/16 0524  WBC 3.8* 3.0* 3.8* 4.0 3.8*  NEUTROABS 1.9  --  1.9 2.4 2.0  HGB 11.6* 11.5* 12.7* 11.4* 11.5*  HCT 34.9* 34.0* 38.1* 34.3* 34.3*  MCV 94.8 93.2 95.0 95.0 94.0  PLT 160 142* 168 167 179   Cardiac Enzymes: No results for input(s): CKTOTAL, CKMB, CKMBINDEX, TROPONINI in the last 168 hours. BNP: Invalid input(s): POCBNP CBG:  Recent Labs Lab 04/26/16 1115 04/26/16 1632 04/26/16 2048 04/27/16 0842 04/27/16 1155  GLUCAP 162* 140* 119* 198* 137*   D-Dimer No results for input(s): DDIMER in the last 72 hours. Hgb A1c No results for input(s): HGBA1C in the last 72 hours. Lipid Profile No results for input(s): CHOL, HDL, LDLCALC, TRIG, CHOLHDL, LDLDIRECT in the last 72 hours. Thyroid function studies No results for input(s): TSH, T4TOTAL, T3FREE, THYROIDAB in the last 72 hours.  Invalid input(s): FREET3 Anemia work up No results for input(s): VITAMINB12, FOLATE, FERRITIN, TIBC, IRON, RETICCTPCT in the last 72 hours. Urinalysis    Component Value Date/Time   COLORURINE YELLOW 04/23/2016 2336   APPEARANCEUR CLEAR 04/23/2016 2336   LABSPEC 1.012 04/23/2016 2336   PHURINE 5.0 04/23/2016 2336   GLUCOSEU 50 (A) 04/23/2016 2336   HGBUR SMALL (A) 04/23/2016 2336   BILIRUBINUR NEGATIVE 04/23/2016 2336   KETONESUR NEGATIVE 04/23/2016 2336   PROTEINUR >=300 (A) 04/23/2016 2336   UROBILINOGEN 0.2 01/11/2015 1740   NITRITE NEGATIVE 04/23/2016 2336    LEUKOCYTESUR NEGATIVE 04/23/2016 2336   Sepsis Labs Invalid input(s): PROCALCITONIN,  WBC,  LACTICIDVEN   Time coordinating discharge: Over 30 minutes  SIGNED:   Janece Canterbury, MD  [facility] 04/27/2016, 3:31 PM Pager   If 7PM-7AM, please contact night-coverage [email or web address] Password [email or web address]

## 2016-04-30 LAB — CULTURE, BLOOD (ROUTINE X 2)
Culture: NO GROWTH
Culture: NO GROWTH

## 2016-07-14 HISTORY — PX: NM MYOVIEW LTD: HXRAD82

## 2016-07-14 HISTORY — PX: TRANSTHORACIC ECHOCARDIOGRAM: SHX275

## 2016-09-20 ENCOUNTER — Ambulatory Visit (INDEPENDENT_AMBULATORY_CARE_PROVIDER_SITE_OTHER): Payer: Medicaid Other | Admitting: Orthopedic Surgery

## 2016-09-20 ENCOUNTER — Ambulatory Visit (INDEPENDENT_AMBULATORY_CARE_PROVIDER_SITE_OTHER): Payer: Medicaid Other

## 2016-09-20 ENCOUNTER — Encounter (INDEPENDENT_AMBULATORY_CARE_PROVIDER_SITE_OTHER): Payer: Self-pay | Admitting: Orthopedic Surgery

## 2016-09-20 VITALS — Ht 72.0 in | Wt 220.0 lb

## 2016-09-20 DIAGNOSIS — E10621 Type 1 diabetes mellitus with foot ulcer: Secondary | ICD-10-CM | POA: Diagnosis not present

## 2016-09-20 DIAGNOSIS — L97523 Non-pressure chronic ulcer of other part of left foot with necrosis of muscle: Secondary | ICD-10-CM

## 2016-09-20 MED ORDER — GABAPENTIN 300 MG PO CAPS: 300.0000 mg | ORAL_CAPSULE | Freq: Two times a day (BID) | ORAL | 3 refills | Status: DC

## 2016-09-20 NOTE — Progress Notes (Signed)
Office Visit Note   Patient: Cody Webster           Date of Birth: [birth year]           MRN: [id number] Visit Date: 09/20/2016              Requested by: Kerin Perna, NP [place], [place] [address] PCP: Kerin Perna, NP  Chief Complaint  Patient presents with  . Left Foot - Wound Check      HPI: Patient states that he has been seen by nurse practitioner for the ulcer beneath the fourth and fifth metatarsal heads left foot he states that nobody referred him to come back to be seen he complains of increasing pain with ambulation complains of drainage and odor. Patient states that due to his renal failure he has been decreased to Neurontin 300 mg twice a day.  Assessment & Plan: Visit Diagnoses:  1. Diabetic ulcer of other part of left foot associated with type 1 diabetes mellitus, with necrosis of muscle ([place])     Plan: Ulcer debridement of skin and soft tissue. Start Dial soap cleansing antibiotic ointment and a Band-Aid dressing change daily.  Follow-Up Instructions: Return in about 4 weeks (around 10/18/2016).   Ortho Exam  Patient is alert, oriented, no adenopathy, well-dressed, normal affect, normal respiratory effort. Examination patient is a strong dorsalis pedis pulse he has no venous stasis ulcers or swelling there is no ascending cellulitis he has a massive ulcer beneath the fourth and fifth metatarsal heads. After informed consent a 10 blade knife was used to debride the skin and soft tissue back to bleeding viable granulation tissue silver nitrate was used for hemostasis the ulcers 4 cm in diameter 5 mm deep with healthy granulation tissue the base this does not probe to bone.  Imaging: No results found.  Labs: Lab Results  Component Value Date   HGBA1C 6.7 (H) 04/23/2016   HGBA1C 10.7 (H) 09/06/2014   HGBA1C 6.2 (H) 12/11/2013   ESRSEDRATE 25 (H) 12/10/2013   CRP <0.5 (L) 12/10/2013   REPTSTATUS 04/30/2016 FINAL 04/24/2016   REPTSTATUS 04/30/2016 FINAL 04/24/2016   CULT  04/24/2016    NO GROWTH 5 DAYS Performed at [facility]  04/24/2016    NO GROWTH 5 DAYS Performed at [facility]     Orders:  Orders Placed This Encounter  Procedures  . XR Foot Complete Left   Meds ordered this encounter  Medications  . gabapentin (NEURONTIN) 300 MG capsule    Sig: Take 1 capsule (300 mg total) by mouth 2 (two) times daily.    Dispense:  60 capsule    Refill:  3     Procedures: No procedures performed  Clinical Data: No additional findings.  ROS:  All other systems negative, except as noted in the HPI. Review of Systems  Objective: Vital Signs: Ht 6' (1.829 m)   Wt 220 lb (99.8 kg)   BMI 29.84 kg/m   Specialty Comments:  No specialty comments available.  PMFS History: Patient Active Problem List   Diagnosis Date Noted  . Streptococcal pneumonia ([place]) 04/27/2016  . CAP (community acquired pneumonia) 04/23/2016  . Malnutrition of moderate degree ([place]) 01/12/2015  . Chronic diastolic heart failure ([place]) 01/11/2015  . Toxic metabolic encephalopathy 30/16/0109  . AKI (acute kidney injury) ([place]) 01/11/2015  . DM type 2, uncontrolled, with neuropathy ([place]) 01/11/2015  . Hyperosmolar non-ketotic state in patient with type 2  diabetes mellitus ([place]) 01/11/2015  . Hyperglycemic hyperosmolar nonketotic coma ([place]) 01/11/2015  . Non-ketotic hyperglycinemia, type II ([place]) 01/11/2015  . Glaucoma   . Hypertensive urgency 09/25/2014  . Eye pain   . Hypoglycemia 09/06/2014  . Acute glaucoma of right eye 09/06/2014  . Acute-on-chronic kidney injury ([place]) 09/06/2014  . Type I diabetes mellitus with complication, uncontrolled ([place]) 09/06/2014  . Anemia 09/06/2014  . Tobacco use disorder 09/06/2014  . Frequent headaches 07/11/2014  . Hyperglycemia 07/11/2014  . DM type 2 causing CKD stage 2 ([place]) 07/11/2014  . CKD (chronic kidney disease), stage III 07/11/2014  . CHF (congestive  heart failure) ([place]) 07/11/2014  . Hyperglycemia due to type 2 diabetes mellitus ([place]) 07/11/2014  . Ataxia 07/11/2014  . CKD (chronic kidney disease) 12/11/2013  . Diabetic foot ulcer ([place]) 12/10/2013  . Diabetes ([place]) 12/10/2013  . HTN (hypertension) 12/10/2013  . Peripheral neuropathy ([place]) 12/10/2013   Past Medical History:  Diagnosis Date  . Carotid artery occlusion   . CHF (congestive heart failure) ([place])   . Diabetes mellitus without complication ([place])   . Hypertension     Family History  Problem Relation Age of Onset  . CAD Mother   . Diabetes type II Father   . Hypertension Sister   . CAD Sister   . Diabetes type II Brother     Past Surgical History:  Procedure Laterality Date  . right foot surgery    . TOE AMPUTATION     Social History   Occupational History  . Not on file.   Social History Main Topics  . Smoking status: Current Every Day Smoker    Packs/day: 0.50    Years: 30.00    Types: Cigarettes, Cigars  . Smokeless tobacco: Former Systems developer    Types: Snuff, Chew    Quit date: 12/11/1983  . Alcohol use Yes     Comment: ocassaionlly   . Drug use: No  . Sexual activity: Not on file

## 2016-10-18 ENCOUNTER — Other Ambulatory Visit: Payer: Self-pay | Admitting: Vascular Surgery

## 2016-10-18 ENCOUNTER — Ambulatory Visit (INDEPENDENT_AMBULATORY_CARE_PROVIDER_SITE_OTHER): Payer: Medicaid Other | Admitting: Orthopedic Surgery

## 2016-10-18 DIAGNOSIS — Z01810 Encounter for preprocedural cardiovascular examination: Secondary | ICD-10-CM

## 2016-10-18 DIAGNOSIS — N184 Chronic kidney disease, stage 4 (severe): Secondary | ICD-10-CM

## 2016-10-20 ENCOUNTER — Encounter: Payer: Self-pay | Admitting: Vascular Surgery

## 2016-10-25 ENCOUNTER — Ambulatory Visit (INDEPENDENT_AMBULATORY_CARE_PROVIDER_SITE_OTHER)
Admission: RE | Admit: 2016-10-25 | Discharge: 2016-10-25 | Disposition: A | Discharge status: Home / Self Care | Payer: Medicaid Other | Source: Ambulatory Visit | Attending: Vascular Surgery | Admitting: Vascular Surgery

## 2016-10-25 ENCOUNTER — Ambulatory Visit (HOSPITAL_COMMUNITY)
Admission: RE | Admit: 2016-10-25 | Discharge: 2016-10-25 | Disposition: A | Discharge status: Home / Self Care | Payer: Medicaid Other | Source: Ambulatory Visit | Attending: Vascular Surgery | Admitting: Vascular Surgery

## 2016-10-25 DIAGNOSIS — N184 Chronic kidney disease, stage 4 (severe): Secondary | ICD-10-CM

## 2016-10-25 DIAGNOSIS — Z01810 Encounter for preprocedural cardiovascular examination: Secondary | ICD-10-CM

## 2016-10-25 DIAGNOSIS — R9439 Abnormal result of other cardiovascular function study: Secondary | ICD-10-CM | POA: Diagnosis not present

## 2016-10-26 ENCOUNTER — Telehealth: Payer: Self-pay | Admitting: *Deleted

## 2016-10-26 ENCOUNTER — Ambulatory Visit (INDEPENDENT_AMBULATORY_CARE_PROVIDER_SITE_OTHER): Payer: Medicaid Other | Admitting: Vascular Surgery

## 2016-10-26 ENCOUNTER — Encounter: Payer: Self-pay | Admitting: Vascular Surgery

## 2016-10-26 ENCOUNTER — Encounter: Payer: Self-pay | Admitting: *Deleted

## 2016-10-26 ENCOUNTER — Other Ambulatory Visit: Payer: Self-pay | Admitting: *Deleted

## 2016-10-26 VITALS — BP 95/63 | HR 100 | Temp 99.4°F | Resp 20 | Ht 72.0 in | Wt 213.8 lb

## 2016-10-26 DIAGNOSIS — N184 Chronic kidney disease, stage 4 (severe): Secondary | ICD-10-CM | POA: Diagnosis not present

## 2016-10-26 NOTE — Progress Notes (Signed)
Patient name: Cody Webster MRN: [id number] DOB: 09-16-1965 Sex: male   REASON FOR CONSULT:    Evaluate for hemodialysis access. The physician requesting the consult is Dr. Moshe Cipro.   HPI:   Cody Webster is a pleasant 51 y.o. male, with stage IV chronic kidney disease who is sent for evaluation for hemodialysis access. He is right handed. He has end-stage renal disease secondary to diabetes and hypertension. He is not yet on dialysis. He has complained of some lack of energy and anorexia. He denies nausea vomiting or palpitations.  I have reviewed the records that were sent from [facility]. The patient has a history of poorly controlled diabetes and hypertension with stage IV chronic kidney disease. In addition the patient has a history of congestive heart failure with an ejection fraction of 50% in February 2016.   I reviewed the lab results that were done on 10/14/2016. GFR was 15.   Past Medical History:  Diagnosis Date  . Carotid artery occlusion   . CHF (congestive heart failure) ([place])   . Diabetes mellitus without complication ([place])   . Hypertension     Family History  Problem Relation Age of Onset  . CAD Mother   . Diabetes type II Father   . Hypertension Sister   . CAD Sister   . Diabetes type II Brother     SOCIAL HISTORY: Social History   Social History  . Marital status: Unknown    Spouse name: N/A  . Number of children: N/A  . Years of education: N/A   Occupational History  . Not on file.   Social History Main Topics  . Smoking status: Current Every Day Smoker    Packs/day: 0.50    Years: 30.00    Types: Cigarettes, Cigars  . Smokeless tobacco: Former Systems developer    Types: Snuff, Chew    Quit date: 12/11/1983  . Alcohol use Yes     Comment: ocassaionlly   . Drug use: No  . Sexual activity: Not on file   Other Topics Concern  . Not on file   Social History Narrative  . No narrative on file    Allergies  Allergen Reactions    . Influenza Vaccines     Chest congestion, fever  . Morphine And Related Shortness Of Breath and Other (See Comments)    Hallucination and shortness of breath Tolerates Norco/Vicodin  . Pneumococcal Vaccines     Nausea, vomiting, fever    Current Outpatient Prescriptions  Medication Sig Dispense Refill  . bisacodyl (DULCOLAX) 10 MG suppository Place 1 suppository (10 mg total) rectally as needed for moderate constipation. 12 suppository 0  . Brinzolamide-Brimonidine (SIMBRINZA) 1-0.2 % SUSP Place 1 drop into the right eye 2 (two) times daily.    . carvedilol (COREG) 25 MG tablet Take 25 mg by mouth every morning. Reported on 05/27/2015  0  . cloNIDine (CATAPRES) 0.2 MG tablet Take 0.2 mg by mouth 2 (two) times daily.  0  . gabapentin (NEURONTIN) 300 MG capsule Take 1 capsule (300 mg total) by mouth 2 (two) times daily. 60 capsule 3  . hydrALAZINE (APRESOLINE) 25 MG tablet Take 1 tablet (25 mg total) by mouth every 8 (eight) hours. (Patient taking differently: Take 50 mg by mouth 3 (three) times daily. ) 90 tablet 0  . HYDROcodone-acetaminophen (NORCO) 10-325 MG tablet Take 1 tablet by mouth 2 (two) times daily as needed for moderate pain.    . hydroxypropyl methylcellulose / hypromellose (ISOPTO  TEARS / GONIOVISC) 2.5 % ophthalmic solution Place 1 drop into both eyes 3 (three) times daily as needed for dry eyes.    . polyethylene glycol (MIRALAX) packet Take 17 g by mouth daily. 14 each 0  . Sennosides (SENNA) 8.6 MG CAPS Take 2 tablets by mouth at bedtime.  0  . timolol (BETIMOL) 0.5 % ophthalmic solution Place 1 drop into the right eye 2 (two) times daily.    . Travoprost, BAK Free, (TRAVATAN) 0.004 % SOLN ophthalmic solution Place 1 drop into both eyes 2 (two) times daily.    . insulin aspart protamine - aspart (NOVOLOG 70/30 MIX) (70-30) 100 UNIT/ML FlexPen 20 units in the morning & 12 units in the evening (Patient not taking: Reported on 10/26/2016) 15 mL 11   No current  facility-administered medications for this visit.     REVIEW OF SYSTEMS:  [X]  denotes positive finding, [ ]  denotes negative finding Cardiac  Comments:  Chest pain or chest pressure:    Shortness of breath upon exertion:    Short of breath when lying flat:    Irregular heart rhythm:        Vascular    Pain in calf, thigh, or hip brought on by ambulation:    Pain in feet at night that wakes you up from your sleep:     Blood clot in your veins:    Leg swelling:         Pulmonary    Oxygen at home:    Productive cough:     Wheezing:         Neurologic    Sudden weakness in arms or legs:     Sudden numbness in arms or legs:     Sudden onset of difficulty speaking or slurred speech:    Temporary loss of vision in one eye:     Problems with dizziness:         Gastrointestinal    Blood in stool:     Vomited blood:         Genitourinary    Burning when urinating:     Blood in urine:        Psychiatric    Major depression:         Hematologic    Bleeding problems:    Problems with blood clotting too easily:        Skin    Rashes or ulcers:        Constitutional    Fever or chills:     PHYSICAL EXAM:   Vitals:   10/26/16 1435  BP: 95/63  Pulse: 100  Resp: 20  Temp: 99.4 F (37.4 C)  TempSrc: Oral  SpO2: 98%  Weight: 213 lb 12.8 oz (97 kg)  Height: 6' (1.829 m)    GENERAL: The patient is a well-nourished male, in no acute distress. The vital signs are documented above. CARDIAC: There is a regular rate and rhythm.  VASCULAR: I do not detect carotid bruits.  He has palpable brachial and radial pulses bilaterally.  PULMONARY: There is good air exchange bilaterally without wheezing or rales. ABDOMEN: Soft and non-tender with normal pitched bowel sounds.  MUSCULOSKELETAL: There are no major deformities or cyanosis. NEUROLOGIC: No focal weakness or paresthesias are detected. SKIN: There are no ulcers or rashes noted. PSYCHIATRIC: The patient has a normal  affect.  DATA:    BILATERAL UPPER EXTREMITY ARTERIAL DUPLEX: I have independently interpreted his bilateral upper extremity arterial duplex scan.  On  the right side there is a triphasic radial and ulnar waveform. The brachial artery is 0.39 cm in diameter.  On the left side there is a triphasic radial and ulnar signal. The brachial artery is 0.43 cm in diameter.  UPPER EXTREMITY VEIN MAP: I have independently interpreted his upper extremity vein map.  On the right side the forearm cephalic vein is small and thickened. The upper arm cephalic vein is small and likely not usable. The basilic vein on the right is also small and sclerotic.  On the left side the forearm cephalic vein is small and sclerotic. The upper arm cephalic vein is small. The basilic vein does not appear to be adequate on the left.  MEDICAL ISSUES:   STAGE IV CHRONIC KIDNEY DISEASE: Based on his vein map, he does not appear to be a candidate for an AV fistula. I have explained however that I will look myself with the ultrasound scanner at the time of surgery. He will likely require placement of an AV graft. I have explained the indications for placement of an AV fistula or AV graft. I've explained that if at all possible we will place an AV fistula.  I have reviewed the risks of placement of an AV fistula including but not limited to: failure of the fistula to mature, need for subsequent interventions, and thrombosis. In addition I have reviewed the potential complications of placement of an AV graft. These risks include, but are not limited to, graft thrombosis, graft infection, wound healing problems, bleeding, arm swelling, and steal syndrome. All the patient's questions were answered and they are agreeable to proceed with surgery. This surgery is scheduled for 11/08/2016.  [name] [facility] [phone]

## 2016-10-26 NOTE — Telephone Encounter (Signed)
Discussed surgery plan with Willeen Cass, NP (preop anesthesia) and she said they would want the patient to have a cardiac clearance / stress test because of 2016 Echo; This showed LVEF 50% with inferior hypokinesis. Patient has Left arm AVF vs AVG scheduled with Dr. Scot Dock on 11-08-16; will get cardiac clearance. Our appt scheduler will contact patient and arrange this appt. He apparently does not have a cardiologist, according to [facility], he sees Dr. Oletta Lamas at [facility].

## 2016-10-27 NOTE — Telephone Encounter (Signed)
Sched appt 11/04/16 at [facility] with Dr. Ellyn Hack at 2:00. Spoke to pt to give him the info and address.

## 2016-11-02 ENCOUNTER — Ambulatory Visit: Payer: Medicaid Other | Admitting: Cardiovascular Disease

## 2016-11-04 ENCOUNTER — Ambulatory Visit (INDEPENDENT_AMBULATORY_CARE_PROVIDER_SITE_OTHER): Payer: Medicaid Other | Admitting: Cardiology

## 2016-11-04 ENCOUNTER — Encounter: Payer: Self-pay | Admitting: Cardiology

## 2016-11-04 VITALS — BP 144/84 | HR 73 | Ht 73.0 in | Wt 217.0 lb

## 2016-11-04 DIAGNOSIS — E1165 Type 2 diabetes mellitus with hyperglycemia: Secondary | ICD-10-CM

## 2016-11-04 DIAGNOSIS — Z01810 Encounter for preprocedural cardiovascular examination: Secondary | ICD-10-CM | POA: Diagnosis not present

## 2016-11-04 DIAGNOSIS — E1140 Type 2 diabetes mellitus with diabetic neuropathy, unspecified: Secondary | ICD-10-CM | POA: Diagnosis not present

## 2016-11-04 DIAGNOSIS — F17200 Nicotine dependence, unspecified, uncomplicated: Secondary | ICD-10-CM

## 2016-11-04 DIAGNOSIS — I10 Essential (primary) hypertension: Secondary | ICD-10-CM | POA: Diagnosis not present

## 2016-11-04 DIAGNOSIS — I5032 Chronic diastolic (congestive) heart failure: Secondary | ICD-10-CM

## 2016-11-04 DIAGNOSIS — IMO0002 Reserved for concepts with insufficient information to code with codable children: Secondary | ICD-10-CM

## 2016-11-04 NOTE — Patient Instructions (Signed)
NO CHANGE WITH TREATMENT     CARDIAC CLEARANCE -- LOW RISK ---FOR FISTULA  PLACEMENT WITH DR DICKSON NO FURTHER CARDIAC TESTING AT PRESENT TIME.   Your physician recommends that you schedule a follow-up appointment in [place] [address] [place].

## 2016-11-04 NOTE — Progress Notes (Signed)
PCP: Cody Perna, NP  Clinic Note: Chief Complaint  Patient presents with  . New Patient (Initial Visit)    needs cardiac clearance, has some chest pain-3 days ago,     HPI: Cody Webster is a 51 y.o. male who is being seen today for Pre-operative evaluation for hemodialysis access with history of "heart failure" at the request of Cody Perna, NP & Cody Webster (Vasc Sgx)  Cody Webster has diabetic kidney disease stage IV who is being evaluated for hemodialysis access. His poorly controlled diabetes and hypertension & long-term smoking with now progressive to stage IV borderline 5 CKD. Has a history of "congestive heart failure but with an ejection fraction of 50%" from echo in 2016 - this would suggest hypertensive heart disease/diastolic heart failure.  He has a chronic left foot diabetic ulcer and has significant diabetic retinopathy and blindness in his right eye. - He has been diagnosed with left carotid stenosis with Hollenhorst plaque related to diabetes.  Cody Webster was last seen on June 13 Cody Webster. He noted likely MG, anorexia, but no significant dyspnea. He still smokes about 1/2-1 pack a day, despite being repeatedly told that he should quit. There is continuation Zetia was eventually quitting, however I did not get the sense from him.  Recent Hospitalizations:    May 2016:  hypertensive urgency (systolic pressures over 758 mmHg associated with acute on chronic renal insufficiency)- restart hydralazine as well as carvedilol. (He is no longer on labetalol but is on carvedilol and clonidine) -> this was following surgery for acute right eye glaucoma at [facility]  August 2016: Hyperosmotic nonketotic hyperglycemia (blood sugars of ~700) with hypertensive urgency (hypertension was thought to be related to him not taking his blood pressure medications due to nausea from hyperglycemia - related to medical nonadherence with insulin therapy )  Studies  Personally Reviewed - (if available, images/films reviewed: From Epic Chart or Care Everywhere)  2-D echo February 2016: EF~50% with inferior hypokinesis. Mild LV dilation. GR 1 DD. Mild biatrial enlargement. Relatively normal valves  Interval History: Cody Webster presents today for evaluation but is very nonfocal. He does not answer questions with anything more than 1 or 2 words. He basically limited as far as any activity because of long-standing left foot ulcer. He is still wearing a hard boot on his left foot as a result of this ulcer. He has a very slow and almost antalgic gait He tells me that basically he tires out easily and somewhat short of breath when he walks around. He has not noted any chest tightness or pressure with rest or exertion as noted any significant PND, orthopnea or edema. He denies any rapid irregular heartbeats or flip-flopping/palpitations. No lightheadedness, dizziness, weakness or syncope/near syncope.No TIA/amaurosis fugax symptoms.  He mentioned something to the CNA about having some chest pain a few days ago. When asked about it he really did not embellish much. It did not seem to be exertional. Seemed to be more positional.  No melena, hematochezia, hematuria, or epstaxis. No claudication.  ROS: A comprehensive was performed. Review of Systems  Constitutional: Positive for malaise/fatigue.  HENT: Negative for congestion.   Respiratory: Negative for shortness of breath.   Cardiovascular: Negative for claudication (Does not walk enough in order to adequately assess).       Per history of present illness  Gastrointestinal: Negative for blood in stool, constipation and melena.  Genitourinary: Negative for hematuria.  Musculoskeletal: Positive for joint pain (Basically  his left foot).  Neurological: Negative for dizziness, focal weakness, seizures, loss of consciousness and weakness.  Psychiatric/Behavioral: Positive for depression. Negative for memory loss. The patient  does not have insomnia.   All other systems reviewed and are negative.  I have reviewed and (if needed) personally updated the patient's problem list, medications, allergies, past medical and surgical history, social and family history.   Past Medical History:  Diagnosis Date  . Carotid artery occlusion   . CHF (congestive heart failure) ([place])   . Diabetes mellitus without complication ([place])   . Hypertension     Past Surgical History:  Procedure Laterality Date  . right foot surgery    . TOE AMPUTATION      Current Meds  Medication Sig  . Brinzolamide-Brimonidine (SIMBRINZA) 1-0.2 % SUSP Place 1 drop into the right eye 2 (two) times daily.  . carvedilol (COREG) 25 MG tablet Take 25 mg by mouth every morning. Reported on 05/27/2015  . cloNIDine (CATAPRES) 0.2 MG tablet Take 0.2 mg by mouth 2 (two) times daily.  Marland Kitchen gabapentin (NEURONTIN) 300 MG capsule Take 1 capsule (300 mg total) by mouth 2 (two) times daily.  . hydrALAZINE (APRESOLINE) 25 MG tablet Take 1 tablet (25 mg total) by mouth every 8 (eight) hours. (Patient taking differently: Take 50 mg by mouth 3 (three) times daily. )  . HYDROcodone-acetaminophen (NORCO) 10-325 MG tablet Take 1 tablet by mouth 2 (two) times daily as needed for moderate pain.  . hydroxypropyl methylcellulose / hypromellose (ISOPTO TEARS / GONIOVISC) 2.5 % ophthalmic solution Place 1 drop into both eyes 3 (three) times daily as needed for dry eyes.    Allergies  Allergen Reactions  . Influenza Vaccines     Chest congestion, fever  . Morphine And Related Shortness Of Breath and Other (See Comments)    Hallucination  Tolerates Norco/Vicodin  . Pneumococcal Vaccines     Nausea, vomiting, fever    Social History   Social History  . Marital status: Single    Spouse name: N/A  . Number of children: 1  . Years of education: N/A   Occupational History  .      Disabled - for Back pain & foot ulcer   Social History Main Topics  . Smoking status:  Current Every Day Smoker    Packs/day: 1.00    Years: 30.00    Types: Cigarettes, Cigars  . Smokeless tobacco: Former Systems developer    Types: Snuff, Chew    Quit date: 12/11/1983  . Alcohol use Yes     Comment: ocassaionlly   . Drug use: No  . Sexual activity: Not Currently   Other Topics Concern  . None   Social History Narrative  . None    family history includes CAD in his mother and sister; Diabetes type II in his brother and father; Hypertension in his sister.  Wt Readings from Last 3 Encounters:  11/04/16 217 lb (98.4 kg)  10/26/16 213 lb 12.8 oz (97 kg)  09/20/16 220 lb (99.8 kg)    PHYSICAL EXAM BP (!) 144/84   Pulse 73   Ht 6\' 1"  (1.854 m)   Wt 217 lb (98.4 kg)   BMI 28.63 kg/m  General appearance: alert, cooperative, appears stated age, no distress. Overweight, not obese HEENT: [place]/AT, - he wears sunglasses inside,  Left pupil is equal and reactive to light. Extraocular muscles are intact Neck: no adenopathy, L carotid bruit and no JVD Lungs: clear to auscultation bilaterally, normal percussion  bilaterally and non-labored (poor respiratory effort Heart: regular rate and rhythm, S1 &S2 normal, no murmur, click, rub or gallop; nondisplaced PMI  Abdomen: soft, non-tender; bowel sounds normal; no masses,  no organomegaly; no HJR Extremities: extremities normal, atraumatic, no cyanosis, and trace bilateral edema ; left foot in a hard sole shoe  Pulses: 2+ and symmetric radial pulses;  Skin: No obvious lesions besides dressing on left foot ulcer. Notably reduced pulses bilaterally left worse than right. Neurologic: Mental status: Alert & oriented x 3, thought content appropriate - very slow, respiratory; non-focal exam.  Pleasant mood & affect. Cranial nerves: normal (II-XII grossly intact)    Adult ECG Report  Rate: 73 ;  Rhythm: normal sinus rhythm and LVH with repolarization abnormality and T-wave inversions that also appeared to be repolarization related.;   Narrative  Interpretation: Relatively stable EKG   Other studies Reviewed: Additional studies/ records that were reviewed today include:  Recent Labs:   Lab Results  Component Value Date   CREATININE 3.02 (H) 04/27/2016   BUN 32 (H) 04/27/2016   NA 137 04/27/2016   K 4.1 04/27/2016   CL 111 04/27/2016   CO2 21 (L) 04/27/2016     Lab Results  Component Value Date   CREATININE 3.02 (H) 04/27/2016   CREATININE 3.71 (H) 04/26/2016   CREATININE 3.85 (H) 04/25/2016  No results found for: CHOL, HDL, LDLCALC, LDLDIRECT, TRIG, CHOLHDL  ASSESSMENT / PLAN: Problem List Items Addressed This Visit    Chronic diastolic heart failure ([place]) (Chronic)    Pretty much Class 1 Symptoms - preserved EF by echo in Feb 2016 - but only Gr 1 DD (despite being admitted with HTN Urgency). I would consider this to be more Hypertensive Heart Disease - without CHF.      DM type 2, uncontrolled, with neuropathy (H[facility]) (Chronic)   Relevant Orders   EKG 12-Lead   Essential hypertension (Chronic)    Borderline BP today - but really only on Coreg, Hydralazine & Clonidine. He seems relatively euvolemic on exam with no active Diastolic CHF Sx  --> he is being followed by Nephrology.  Will defer management of BP to his nephrologist.       Preop cardiovascular exam    Ashtan clearly has cardiac risk factors with diabetes and poorly controlled hypertension. I don't see any recent lipids.  His Echo ~2 years ago had preserved EF with a possible regional wall motion abnormality.   He has not had any notable CHF symptoms & also has not had any anginal CP.  He probably would benefit from an ischemic evaluation with a suggested regional wall motion abnormality, but with the absence of symptoms, I would prefer to wait until he has had his fistula placed.    Peripheral vascular procedure in this setting would be low risk procedure and in the absence of active angina or CHF symptoms, I would recommend proceeding with his  procedure.    An abnormal non-invasive ischemic evaluation would likely lead to invasive evaluation with cardiac catheterization - which would potentially put at risk for progression to ESRD.  Given his present co-morbid condition with diabetic ulcer & minimal activity, I doubt that a coronary intervention would alter his symptoms.      Relevant Orders   EKG 12-Lead   Tobacco use disorder - Primary (Chronic)    Clearly, he does not get the message that with HTN & poorly controlled HTN, he is a risk for progression of vascular disease -> he already  has a L foot DM ulcer (probably with PAD - at least microvascular).  I counseled him briefly, but will be more forceful on f/u visits.      Relevant Orders   EKG 12-Lead     He does have notable Cardiac RFs, but I do not feel that he needs any additional Cardiac evaluation before having a fistula placed. I do think he should be seen in follow up after his fistula is placed.  Current medicines are reviewed at length with the patient today. (+/- concerns) n/a The following changes have been made: n/a  Patient Instructions  NO CHANGE WITH TREATMENT     CARDIAC CLEARANCE -- LOW RISK ---FOR FISTULA  PLACEMENT WITH DR DICKSON NO FURTHER CARDIAC TESTING AT PRESENT TIME.   Your physician recommends that you schedule a follow-up appointment in [place] [address] [place].     Studies Ordered:   Orders Placed This Encounter  Procedures  . EKG 12-Lead      Glenetta Hew, M.D., M.S. Interventional Cardiologist   Pager # [phone] Phone # [phone] [address] [place], [place] [address]

## 2016-11-06 ENCOUNTER — Encounter: Payer: Self-pay | Admitting: Cardiology

## 2016-11-06 NOTE — Assessment & Plan Note (Signed)
Clearly, he does not get the message that with HTN & poorly controlled HTN, he is a risk for progression of vascular disease -> he already has a L foot DM ulcer (probably with PAD - at least microvascular).  I counseled him briefly, but will be more forceful on f/u visits.

## 2016-11-06 NOTE — Assessment & Plan Note (Signed)
Borderline BP today - but really only on Coreg, Hydralazine & Clonidine. He seems relatively euvolemic on exam with no active Diastolic CHF Sx  --> he is being followed by Nephrology.  Will defer management of BP to his nephrologist.

## 2016-11-06 NOTE — Assessment & Plan Note (Signed)
Pretty much Class 1 Symptoms - preserved EF by echo in Feb 2016 - but only Gr 1 DD (despite being admitted with HTN Urgency). I would consider this to be more Hypertensive Heart Disease - without CHF.

## 2016-11-06 NOTE — Assessment & Plan Note (Signed)
Cody Webster clearly has cardiac risk factors with diabetes and poorly controlled hypertension. I don't see any recent lipids.  His Echo ~2 years ago had preserved EF with a possible regional wall motion abnormality.   He has not had any notable CHF symptoms & also has not had any anginal CP.  He probably would benefit from an ischemic evaluation with a suggested regional wall motion abnormality, but with the absence of symptoms, I would prefer to wait until he has had his fistula placed.    Peripheral vascular procedure in this setting would be low risk procedure and in the absence of active angina or CHF symptoms, I would recommend proceeding with his procedure.    An abnormal non-invasive ischemic evaluation would likely lead to invasive evaluation with cardiac catheterization - which would potentially put at risk for progression to ESRD.  Given his present co-morbid condition with diabetic ulcer & minimal activity, I doubt that a coronary intervention would alter his symptoms.

## 2016-11-07 ENCOUNTER — Encounter (HOSPITAL_COMMUNITY): Payer: Self-pay | Admitting: *Deleted

## 2016-11-07 NOTE — Progress Notes (Signed)
Spoke with pt for pre-op call. Pt saw Dr. Ellyn Hack for cardiac clearance on 11/04/16. Has hx of CHF. Clearance given by Dr. Ellyn Hack. Pt is a type 2 diabetic. Pt does not remember what or when his last A1C was. Pt states he has not checked his fasting blood sugar for several weeks due to moving and losing his supplies. States prior to that they were running between 80-115.

## 2016-11-08 ENCOUNTER — Ambulatory Visit (HOSPITAL_COMMUNITY): Payer: Medicaid Other | Admitting: Emergency Medicine

## 2016-11-08 ENCOUNTER — Ambulatory Visit (HOSPITAL_COMMUNITY)
Admission: RE | Admit: 2016-11-08 | Discharge: 2016-11-08 | Disposition: A | Discharge status: Home / Self Care | Payer: Medicaid Other | Source: Ambulatory Visit | Attending: Vascular Surgery | Admitting: Vascular Surgery

## 2016-11-08 ENCOUNTER — Encounter (HOSPITAL_COMMUNITY)
Admission: RE | Disposition: A | Discharge status: Home / Self Care | Payer: Self-pay | Source: Ambulatory Visit | Attending: Vascular Surgery

## 2016-11-08 ENCOUNTER — Encounter (HOSPITAL_COMMUNITY): Payer: Self-pay | Admitting: *Deleted

## 2016-11-08 DIAGNOSIS — Z87891 Personal history of nicotine dependence: Secondary | ICD-10-CM | POA: Diagnosis not present

## 2016-11-08 DIAGNOSIS — E1151 Type 2 diabetes mellitus with diabetic peripheral angiopathy without gangrene: Secondary | ICD-10-CM | POA: Diagnosis not present

## 2016-11-08 DIAGNOSIS — Z794 Long term (current) use of insulin: Secondary | ICD-10-CM | POA: Diagnosis not present

## 2016-11-08 DIAGNOSIS — N186 End stage renal disease: Secondary | ICD-10-CM | POA: Insufficient documentation

## 2016-11-08 DIAGNOSIS — K219 Gastro-esophageal reflux disease without esophagitis: Secondary | ICD-10-CM | POA: Diagnosis not present

## 2016-11-08 DIAGNOSIS — Z7982 Long term (current) use of aspirin: Secondary | ICD-10-CM | POA: Insufficient documentation

## 2016-11-08 DIAGNOSIS — I132 Hypertensive heart and chronic kidney disease with heart failure and with stage 5 chronic kidney disease, or end stage renal disease: Secondary | ICD-10-CM | POA: Insufficient documentation

## 2016-11-08 DIAGNOSIS — Z8249 Family history of ischemic heart disease and other diseases of the circulatory system: Secondary | ICD-10-CM | POA: Diagnosis not present

## 2016-11-08 DIAGNOSIS — Z887 Allergy status to serum and vaccine status: Secondary | ICD-10-CM | POA: Insufficient documentation

## 2016-11-08 DIAGNOSIS — Z79899 Other long term (current) drug therapy: Secondary | ICD-10-CM | POA: Insufficient documentation

## 2016-11-08 DIAGNOSIS — E1122 Type 2 diabetes mellitus with diabetic chronic kidney disease: Secondary | ICD-10-CM | POA: Insufficient documentation

## 2016-11-08 DIAGNOSIS — Z885 Allergy status to narcotic agent status: Secondary | ICD-10-CM | POA: Diagnosis not present

## 2016-11-08 DIAGNOSIS — Z7984 Long term (current) use of oral hypoglycemic drugs: Secondary | ICD-10-CM | POA: Insufficient documentation

## 2016-11-08 DIAGNOSIS — I509 Heart failure, unspecified: Secondary | ICD-10-CM | POA: Diagnosis not present

## 2016-11-08 DIAGNOSIS — N184 Chronic kidney disease, stage 4 (severe): Secondary | ICD-10-CM | POA: Diagnosis not present

## 2016-11-08 HISTORY — DX: Gastro-esophageal reflux disease without esophagitis: K21.9

## 2016-11-08 HISTORY — DX: Polyneuropathy, unspecified: G62.9

## 2016-11-08 HISTORY — DX: Pneumonia, unspecified organism: J18.9

## 2016-11-08 HISTORY — PX: AV FISTULA PLACEMENT: SHX1204

## 2016-11-08 HISTORY — DX: Chronic kidney disease, unspecified: N18.9

## 2016-11-08 HISTORY — DX: Unspecified convulsions: R56.9

## 2016-11-08 LAB — POCT I-STAT 4, (NA,K, GLUC, HGB,HCT)
Glucose, Bld: 137 mg/dL — ABNORMAL HIGH (ref 65–99)
HCT: 33 % — ABNORMAL LOW (ref 39.0–52.0)
HEMOGLOBIN: 11.2 g/dL — AB (ref 13.0–17.0)
POTASSIUM: 4.1 mmol/L (ref 3.5–5.1)
Sodium: 142 mmol/L (ref 135–145)

## 2016-11-08 LAB — GLUCOSE, CAPILLARY
Glucose-Capillary: 151 mg/dL — ABNORMAL HIGH (ref 65–99)
Glucose-Capillary: 147 mg/dL — ABNORMAL HIGH (ref 65–99)

## 2016-11-08 SURGERY — ARTERIOVENOUS (AV) FISTULA CREATION
Anesthesia: Monitor Anesthesia Care | Laterality: Left

## 2016-11-08 MED ORDER — HEPARIN SODIUM (PORCINE) 1000 UNIT/ML IJ SOLN
INTRAMUSCULAR | Status: DC | PRN
  Administered 2016-11-08: 8000 [IU] via INTRAVENOUS

## 2016-11-08 MED ORDER — LIDOCAINE HCL (PF) 1 % IJ SOLN
INTRAMUSCULAR | Status: DC | PRN
Start: 2016-11-08 — End: 2016-11-08
  Administered 2016-11-08: 30 mL

## 2016-11-08 MED ORDER — PHENYLEPHRINE 40 MCG/ML (10ML) SYRINGE FOR IV PUSH (FOR BLOOD PRESSURE SUPPORT)
PREFILLED_SYRINGE | INTRAVENOUS | Status: AC
  Filled 2016-11-08: qty 10

## 2016-11-08 MED ORDER — FENTANYL CITRATE (PF) 250 MCG/5ML IJ SOLN
INTRAMUSCULAR | Status: DC | PRN
  Administered 2016-11-08: 50 ug via INTRAVENOUS

## 2016-11-08 MED ORDER — CHLORHEXIDINE GLUCONATE 4 % EX LIQD: 60.0000 mL | Freq: Once | CUTANEOUS | Status: DC

## 2016-11-08 MED ORDER — SODIUM CHLORIDE 0.9 % IV SOLN
INTRAVENOUS | Status: DC
  Administered 2016-11-08 (×2): via INTRAVENOUS

## 2016-11-08 MED ORDER — 0.9 % SODIUM CHLORIDE (POUR BTL) OPTIME
TOPICAL | Status: DC | PRN
  Administered 2016-11-08: 1000 mL

## 2016-11-08 MED ORDER — MIDAZOLAM HCL 2 MG/2ML IJ SOLN
INTRAMUSCULAR | Status: AC
  Filled 2016-11-08: qty 2

## 2016-11-08 MED ORDER — PROPOFOL 10 MG/ML IV BOLUS
INTRAVENOUS | Status: AC
  Filled 2016-11-08: qty 20

## 2016-11-08 MED ORDER — PAPAVERINE HCL 30 MG/ML IJ SOLN
INTRAMUSCULAR | Status: AC
  Filled 2016-11-08: qty 2

## 2016-11-08 MED ORDER — LIDOCAINE 2% (20 MG/ML) 5 ML SYRINGE
INTRAMUSCULAR | Status: DC | PRN
  Administered 2016-11-08: 60 mg via INTRAVENOUS

## 2016-11-08 MED ORDER — SODIUM CHLORIDE 0.9 % IV SOLN: INTRAVENOUS | Status: DC

## 2016-11-08 MED ORDER — PAPAVERINE HCL 30 MG/ML IJ SOLN
INTRAMUSCULAR | Status: DC | PRN
  Administered 2016-11-08: 30 mg via INTRAVENOUS

## 2016-11-08 MED ORDER — EPHEDRINE 5 MG/ML INJ
INTRAVENOUS | Status: AC
  Filled 2016-11-08: qty 10

## 2016-11-08 MED ORDER — MIDAZOLAM HCL 5 MG/5ML IJ SOLN
INTRAMUSCULAR | Status: DC | PRN
  Administered 2016-11-08: 2 mg via INTRAVENOUS

## 2016-11-08 MED ORDER — FENTANYL CITRATE (PF) 250 MCG/5ML IJ SOLN
INTRAMUSCULAR | Status: AC
  Filled 2016-11-08: qty 5

## 2016-11-08 MED ORDER — MEPERIDINE HCL 25 MG/ML IJ SOLN: 6.2500 mg | INTRAMUSCULAR | Status: DC | PRN

## 2016-11-08 MED ORDER — HEPARIN SODIUM (PORCINE) 5000 UNIT/ML IJ SOLN
INTRAMUSCULAR | Status: DC | PRN
  Administered 2016-11-08: 10:00:00

## 2016-11-08 MED ORDER — OXYCODONE-ACETAMINOPHEN 10-325 MG PO TABS: 1.0000 | ORAL_TABLET | Freq: Four times a day (QID) | ORAL | 0 refills | Status: DC | PRN

## 2016-11-08 MED ORDER — LIDOCAINE HCL (PF) 1 % IJ SOLN
INTRAMUSCULAR | Status: AC
  Filled 2016-11-08: qty 30

## 2016-11-08 MED ORDER — PROMETHAZINE HCL 25 MG/ML IJ SOLN: 6.2500 mg | INTRAMUSCULAR | Status: DC | PRN

## 2016-11-08 MED ORDER — THROMBIN 20000 UNITS EX SOLR
CUTANEOUS | Status: AC
  Filled 2016-11-08: qty 20000

## 2016-11-08 MED ORDER — SUCCINYLCHOLINE CHLORIDE 200 MG/10ML IV SOSY
PREFILLED_SYRINGE | INTRAVENOUS | Status: AC
  Filled 2016-11-08: qty 10

## 2016-11-08 MED ORDER — PROTAMINE SULFATE 10 MG/ML IV SOLN
INTRAVENOUS | Status: AC
  Filled 2016-11-08: qty 5

## 2016-11-08 MED ORDER — HEPARIN SODIUM (PORCINE) 1000 UNIT/ML IJ SOLN
INTRAMUSCULAR | Status: AC
Start: 2016-11-08 — End: 2016-11-08
  Filled 2016-11-08: qty 1

## 2016-11-08 MED ORDER — FENTANYL CITRATE (PF) 100 MCG/2ML IJ SOLN: 25.0000 ug | INTRAMUSCULAR | Status: DC | PRN

## 2016-11-08 MED ORDER — PROPOFOL 500 MG/50ML IV EMUL
INTRAVENOUS | Status: DC | PRN
  Administered 2016-11-08: 100 ug/kg/min via INTRAVENOUS

## 2016-11-08 MED ORDER — PROTAMINE SULFATE 10 MG/ML IV SOLN
INTRAVENOUS | Status: DC | PRN
  Administered 2016-11-08 (×4): 10 mg via INTRAVENOUS

## 2016-11-08 MED ORDER — MIDAZOLAM HCL 2 MG/2ML IJ SOLN: 0.5000 mg | Freq: Once | INTRAMUSCULAR | Status: DC | PRN

## 2016-11-08 MED ORDER — DEXTROSE 5 % IV SOLN
1.5000 g | INTRAVENOUS | Status: AC
  Administered 2016-11-08: 1.5 g via INTRAVENOUS
  Filled 2016-11-08: qty 1.5

## 2016-11-08 MED ORDER — LIDOCAINE 2% (20 MG/ML) 5 ML SYRINGE
INTRAMUSCULAR | Status: AC
  Filled 2016-11-08: qty 5

## 2016-11-08 SURGICAL SUPPLY — 33 items
ARMBAND PINK RESTRICT EXTREMIT (MISCELLANEOUS) ×3 IMPLANT
CANISTER SUCT 3000ML PPV (MISCELLANEOUS) ×3 IMPLANT
CANNULA VESSEL 3MM 2 BLNT TIP (CANNULA) ×3 IMPLANT
CLIP TI MEDIUM 6 (CLIP) ×3 IMPLANT
CLIP TI WIDE RED SMALL 6 (CLIP) ×9 IMPLANT
COVER PROBE W GEL 5X96 (DRAPES) IMPLANT
DECANTER SPIKE VIAL GLASS SM (MISCELLANEOUS) ×3 IMPLANT
DERMABOND ADVANCED (GAUZE/BANDAGES/DRESSINGS) ×2
DERMABOND ADVANCED .7 DNX12 (GAUZE/BANDAGES/DRESSINGS) ×1 IMPLANT
ELECT REM PT RETURN 9FT ADLT (ELECTROSURGICAL) ×3
ELECTRODE REM PT RTRN 9FT ADLT (ELECTROSURGICAL) ×1 IMPLANT
GLOVE BIO SURGEON STRL SZ7.5 (GLOVE) ×3 IMPLANT
GLOVE BIOGEL PI IND STRL 8 (GLOVE) ×1 IMPLANT
GLOVE BIOGEL PI INDICATOR 8 (GLOVE) ×2
GOWN STRL REUS W/ TWL LRG LVL3 (GOWN DISPOSABLE) ×3 IMPLANT
GOWN STRL REUS W/TWL LRG LVL3 (GOWN DISPOSABLE) ×6
KIT BASIN OR (CUSTOM PROCEDURE TRAY) ×3 IMPLANT
KIT ROOM TURNOVER OR (KITS) ×3 IMPLANT
NEEDLE 18GX1X1/2 (RX/OR ONLY) (NEEDLE) ×3 IMPLANT
NS IRRIG 1000ML POUR BTL (IV SOLUTION) ×3 IMPLANT
PACK CV ACCESS (CUSTOM PROCEDURE TRAY) ×3 IMPLANT
PAD ARMBOARD 7.5X6 YLW CONV (MISCELLANEOUS) ×6 IMPLANT
SPONGE SURGIFOAM ABS GEL 100 (HEMOSTASIS) IMPLANT
SUT PROLENE 6 0 BV (SUTURE) ×6 IMPLANT
SUT VIC AB 3-0 SH 27 (SUTURE) ×4
SUT VIC AB 3-0 SH 27X BRD (SUTURE) ×2 IMPLANT
SUT VIC AB 4-0 PS2 27 (SUTURE) ×3 IMPLANT
SUT VICRYL 4-0 PS2 18IN ABS (SUTURE) ×3 IMPLANT
SYR 20CC LL (SYRINGE) ×3 IMPLANT
SYR 3ML LL SCALE MARK (SYRINGE) ×3 IMPLANT
SYR 5ML LUER SLIP (SYRINGE) ×3 IMPLANT
UNDERPAD 30X30 (UNDERPADS AND DIAPERS) ×3 IMPLANT
WATER STERILE IRR 1000ML POUR (IV SOLUTION) ×3 IMPLANT

## 2016-11-08 NOTE — Transfer of Care (Signed)
Immediate Anesthesia Transfer of Care Note  Patient: Cody Webster  Procedure(s) Performed: Procedure(s): ARTERIOVENOUS (AV) FISTULA CREATION (Left)  Patient Location: PACU  Anesthesia Type:MAC  Level of Consciousness: lethargic and responds to stimulation  Airway & Oxygen Therapy: Patient Spontanous Breathing and Patient connected to nasal cannula oxygen  Post-op Assessment: Report given to RN, Post -op Vital signs reviewed and stable and Patient moving all extremities  Post vital signs: Reviewed and stable  Last Vitals:  Vitals:   11/08/16 0807 11/08/16 1143  BP: (!) 187/80 129/74  Pulse: 78 82  Resp: 20 13  Temp: 37.1 C 36.6 C    Last Pain:  Vitals:   11/08/16 1143  TempSrc:   PainSc: (P) Asleep      Patients Stated Pain Goal: 2 (51/70/01 7494)  Complications: No apparent anesthesia complications

## 2016-11-08 NOTE — Anesthesia Preprocedure Evaluation (Addendum)
Anesthesia Evaluation  Patient identified by MRN, date of birth, ID band Patient awake    Reviewed: Allergy & Precautions, NPO status , Patient's Chart, lab work & pertinent test results, reviewed documented beta blocker date and time   History of Anesthesia Complications Negative for: history of anesthetic complications  Airway Mallampati: I  TM Distance: >3 FB Neck ROM: Full    Dental  (+) Edentulous Upper, Edentulous Lower   Pulmonary COPD, Current Smoker,    breath sounds clear to auscultation       Cardiovascular hypertension, Pt. on medications and Pt. on home beta blockers (-) angina+ Peripheral Vascular Disease   Rhythm:Regular Rate:Normal  '16 ECHO: LVEF is approximately 50% with inferior hypokinesis. The cavity size was mildly dilated. Wall thickness increased in a pattern of mild LVH   Neuro/Psych  Headaches, Seizures - (27 years ago), Well Controlled,     GI/Hepatic Neg liver ROS, GERD  Controlled,  Endo/Other  diabetes (glu 151), Oral Hypoglycemic Agents  Renal/GU CRFRenal disease (K+ 4.1)No dialysis yet     Musculoskeletal   Abdominal   Peds  Hematology negative hematology ROS (+)   Anesthesia Other Findings   Reproductive/Obstetrics                            Anesthesia Physical Anesthesia Plan  ASA: III  Anesthesia Plan: MAC   Post-op Pain Management:    Induction:   PONV Risk Score and Plan: 0 and Ondansetron  Airway Management Planned: Simple Face Mask and Natural Airway  Additional Equipment:   Intra-op Plan:   Post-operative Plan:   Informed Consent: I have reviewed the patients History and Physical, chart, labs and discussed the procedure including the risks, benefits and alternatives for the proposed anesthesia with the patient or authorized representative who has indicated his/her understanding and acceptance.     Plan Discussed with: CRNA and  Surgeon  Anesthesia Plan Comments: (Plan routine monitors, MAC)        Anesthesia Quick Evaluation

## 2016-11-08 NOTE — Anesthesia Procedure Notes (Signed)
Procedure Name: MAC Date/Time: 11/08/2016 10:00 AM Performed by: Myna Bright Pre-anesthesia Checklist: Patient identified, Emergency Drugs available, Suction available and Patient being monitored Patient Re-evaluated:Patient Re-evaluated prior to inductionOxygen Delivery Method: Simple face mask Placement Confirmation: positive ETCO2

## 2016-11-08 NOTE — H&P (View-Only) (Signed)
Patient name: Cody Webster MRN: [id number] DOB: August 05, 1965 Sex: male   REASON FOR CONSULT:    Evaluate for hemodialysis access. The physician requesting the consult is Dr. Moshe Cipro.   HPI:   Cody Webster is a pleasant 51 y.o. male, with stage IV chronic kidney disease who is sent for evaluation for hemodialysis access. He is right handed. He has end-stage renal disease secondary to diabetes and hypertension. He is not yet on dialysis. He has complained of some lack of energy and anorexia. He denies nausea vomiting or palpitations.  I have reviewed the records that were sent from [facility]. The patient has a history of poorly controlled diabetes and hypertension with stage IV chronic kidney disease. In addition the patient has a history of congestive heart failure with an ejection fraction of 50% in February 2016.   I reviewed the lab results that were done on 10/14/2016. GFR was 15.   Past Medical History:  Diagnosis Date  . Carotid artery occlusion   . CHF (congestive heart failure) ([place])   . Diabetes mellitus without complication ([place])   . Hypertension     Family History  Problem Relation Age of Onset  . CAD Mother   . Diabetes type II Father   . Hypertension Sister   . CAD Sister   . Diabetes type II Brother     SOCIAL HISTORY: Social History   Social History  . Marital status: Unknown    Spouse name: N/A  . Number of children: N/A  . Years of education: N/A   Occupational History  . Not on file.   Social History Main Topics  . Smoking status: Current Every Day Smoker    Packs/day: 0.50    Years: 30.00    Types: Cigarettes, Cigars  . Smokeless tobacco: Former Systems developer    Types: Snuff, Chew    Quit date: 12/11/1983  . Alcohol use Yes     Comment: ocassaionlly   . Drug use: No  . Sexual activity: Not on file   Other Topics Concern  . Not on file   Social History Narrative  . No narrative on file    Allergies  Allergen Reactions    . Influenza Vaccines     Chest congestion, fever  . Morphine And Related Shortness Of Breath and Other (See Comments)    Hallucination and shortness of breath Tolerates Norco/Vicodin  . Pneumococcal Vaccines     Nausea, vomiting, fever    Current Outpatient Prescriptions  Medication Sig Dispense Refill  . bisacodyl (DULCOLAX) 10 MG suppository Place 1 suppository (10 mg total) rectally as needed for moderate constipation. 12 suppository 0  . Brinzolamide-Brimonidine (SIMBRINZA) 1-0.2 % SUSP Place 1 drop into the right eye 2 (two) times daily.    . carvedilol (COREG) 25 MG tablet Take 25 mg by mouth every morning. Reported on 05/27/2015  0  . cloNIDine (CATAPRES) 0.2 MG tablet Take 0.2 mg by mouth 2 (two) times daily.  0  . gabapentin (NEURONTIN) 300 MG capsule Take 1 capsule (300 mg total) by mouth 2 (two) times daily. 60 capsule 3  . hydrALAZINE (APRESOLINE) 25 MG tablet Take 1 tablet (25 mg total) by mouth every 8 (eight) hours. (Patient taking differently: Take 50 mg by mouth 3 (three) times daily. ) 90 tablet 0  . HYDROcodone-acetaminophen (NORCO) 10-325 MG tablet Take 1 tablet by mouth 2 (two) times daily as needed for moderate pain.    . hydroxypropyl methylcellulose / hypromellose (ISOPTO  TEARS / GONIOVISC) 2.5 % ophthalmic solution Place 1 drop into both eyes 3 (three) times daily as needed for dry eyes.    . polyethylene glycol (MIRALAX) packet Take 17 g by mouth daily. 14 each 0  . Sennosides (SENNA) 8.6 MG CAPS Take 2 tablets by mouth at bedtime.  0  . timolol (BETIMOL) 0.5 % ophthalmic solution Place 1 drop into the right eye 2 (two) times daily.    . Travoprost, BAK Free, (TRAVATAN) 0.004 % SOLN ophthalmic solution Place 1 drop into both eyes 2 (two) times daily.    . insulin aspart protamine - aspart (NOVOLOG 70/30 MIX) (70-30) 100 UNIT/ML FlexPen 20 units in the morning & 12 units in the evening (Patient not taking: Reported on 10/26/2016) 15 mL 11   No current  facility-administered medications for this visit.     REVIEW OF SYSTEMS:  [X]  denotes positive finding, [ ]  denotes negative finding Cardiac  Comments:  Chest pain or chest pressure:    Shortness of breath upon exertion:    Short of breath when lying flat:    Irregular heart rhythm:        Vascular    Pain in calf, thigh, or hip brought on by ambulation:    Pain in feet at night that wakes you up from your sleep:     Blood clot in your veins:    Leg swelling:         Pulmonary    Oxygen at home:    Productive cough:     Wheezing:         Neurologic    Sudden weakness in arms or legs:     Sudden numbness in arms or legs:     Sudden onset of difficulty speaking or slurred speech:    Temporary loss of vision in one eye:     Problems with dizziness:         Gastrointestinal    Blood in stool:     Vomited blood:         Genitourinary    Burning when urinating:     Blood in urine:        Psychiatric    Major depression:         Hematologic    Bleeding problems:    Problems with blood clotting too easily:        Skin    Rashes or ulcers:        Constitutional    Fever or chills:     PHYSICAL EXAM:   Vitals:   10/26/16 1435  BP: 95/63  Pulse: 100  Resp: 20  Temp: 99.4 F (37.4 C)  TempSrc: Oral  SpO2: 98%  Weight: 213 lb 12.8 oz (97 kg)  Height: 6' (1.829 m)    GENERAL: The patient is a well-nourished male, in no acute distress. The vital signs are documented above. CARDIAC: There is a regular rate and rhythm.  VASCULAR: I do not detect carotid bruits.  He has palpable brachial and radial pulses bilaterally.  PULMONARY: There is good air exchange bilaterally without wheezing or rales. ABDOMEN: Soft and non-tender with normal pitched bowel sounds.  MUSCULOSKELETAL: There are no major deformities or cyanosis. NEUROLOGIC: No focal weakness or paresthesias are detected. SKIN: There are no ulcers or rashes noted. PSYCHIATRIC: The patient has a normal  affect.  DATA:    BILATERAL UPPER EXTREMITY ARTERIAL DUPLEX: I have independently interpreted his bilateral upper extremity arterial duplex scan.  On  the right side there is a triphasic radial and ulnar waveform. The brachial artery is 0.39 cm in diameter.  On the left side there is a triphasic radial and ulnar signal. The brachial artery is 0.43 cm in diameter.  UPPER EXTREMITY VEIN MAP: I have independently interpreted his upper extremity vein map.  On the right side the forearm cephalic vein is small and thickened. The upper arm cephalic vein is small and likely not usable. The basilic vein on the right is also small and sclerotic.  On the left side the forearm cephalic vein is small and sclerotic. The upper arm cephalic vein is small. The basilic vein does not appear to be adequate on the left.  MEDICAL ISSUES:   STAGE IV CHRONIC KIDNEY DISEASE: Based on his vein map, he does not appear to be a candidate for an AV fistula. I have explained however that I will look myself with the ultrasound scanner at the time of surgery. He will likely require placement of an AV graft. I have explained the indications for placement of an AV fistula or AV graft. I've explained that if at all possible we will place an AV fistula.  I have reviewed the risks of placement of an AV fistula including but not limited to: failure of the fistula to mature, need for subsequent interventions, and thrombosis. In addition I have reviewed the potential complications of placement of an AV graft. These risks include, but are not limited to, graft thrombosis, graft infection, wound healing problems, bleeding, arm swelling, and steal syndrome. All the patient's questions were answered and they are agreeable to proceed with surgery. This surgery is scheduled for 11/08/2016.  Cody Webster [facility] [phone]

## 2016-11-08 NOTE — Discharge Instructions (Signed)
° ° °  11/08/2016 Cody Webster [id number] 1965/10/19  Surgeon(s): Angelia Mould, MD  Procedure(s): Left arm brachiocephalic AV fistula creation.  x Do not stick fistula for 12 weeks

## 2016-11-08 NOTE — Interval H&P Note (Signed)
History and Physical Interval Note:  11/08/2016 8:29 AM  Cody Webster  has presented today for surgery, with the diagnosis of Stage 4 Chronic kidney disease  The various methods of treatment have been discussed with the patient and family. After consideration of risks, benefits and other options for treatment, the patient has consented to  Procedure(s): ARTERIOVENOUS (AV) FISTULA CREATION VS GRAFT INSERTION (Left) as a surgical intervention .  The patient's history has been reviewed, patient examined, no change in status, stable for surgery.  I have reviewed the patient's chart and labs.  Questions were answered to the patient's satisfaction.     [name]

## 2016-11-08 NOTE — Anesthesia Postprocedure Evaluation (Signed)
Anesthesia Post Note  Patient: Cody Webster  Procedure(s) Performed: Procedure(s) (LRB): ARTERIOVENOUS (AV) FISTULA CREATION (Left)     Patient location during evaluation: PACU Anesthesia Type: MAC Level of consciousness: awake and alert, oriented and patient cooperative Pain management: pain level controlled Vital Signs Assessment: post-procedure vital signs reviewed and stable Respiratory status: spontaneous breathing, nonlabored ventilation and respiratory function stable Cardiovascular status: blood pressure returned to baseline and stable Postop Assessment: no signs of nausea or vomiting Anesthetic complications: no    Last Vitals:  Vitals:   11/08/16 1217 11/08/16 1227  BP: (!) 155/80 (!) 151/86  Pulse: 78   Resp: 14   Temp: 36.1 C     Last Pain:  Vitals:   11/08/16 1217  TempSrc:   PainSc: 0-No pain                 Shakesha Soltau,E. [place] Brick

## 2016-11-08 NOTE — Op Note (Signed)
    NAME: Cody Webster    MRN: [id number] DOB: 17-Jan-1966    DATE OF OPERATION: 11/08/2016  PREOP DIAGNOSIS:    Stage IV chronic kidney disease  POSTOP DIAGNOSIS:    Same  PROCEDURE:    LEFT BRACHIOCEPHALIC AV FISTULA:   SURGEON: Judeth Cornfield. Scot Dock, MD, FACS  ASSIST: Leontine Locket, PA  ANESTHESIA: Local with sedation   EBL: Minimal  INDICATIONS:    Cody Webster is a 51 y.o. male who presents for new access.  FINDINGS:    3.5 mm upper arm cephalic vein. The vein was quite lateral and therefore I had to mobilize a longer length in order to reach the brachial artery.  TECHNIQUE:    The patient was taken to the operating room and sedated by anesthesia. The left upper extremity was prepped and draped in the usual sterile fashion. The upper arm cephalic vein by my duplex exam appeared to be small but potentially usable for a fistula. A longitudinal incision was made over the cephalic vein above the antecubital space and then also below. The vein was mobilized over this entire length with branches divided between clips and 3 also ties. Separate longitudinal incision was made over the brachial artery. Here the brachial artery was dissected free. The vein was ligated distally and gently distended. It was tunneled for anastomosis to the brachial artery. The patient was heparinized. The brachial artery was clamped proximally and distally and a longitudinal arteriotomy was made. The vein was sewn end-to-side to the artery using continuous 6-0 Prolene suture. At the completion was an excellent thrill in the fistula and a radial and ulnar signal with Doppler. Hemostasis was obtained in the wounds. Each of the wounds was closed the deep layer of 3-0 Vicryl and skin closed with 4-0 Vicryl. Dermabond was applied. Patient tolerated the procedure well and was transferred to the recovery room in stable condition. All needle and sponge counts were correct.  [name], MD,  FACS [facility]  DATE OF DICTATION:   11/08/2016

## 2016-11-09 ENCOUNTER — Encounter (HOSPITAL_COMMUNITY): Payer: Self-pay | Admitting: Vascular Surgery

## 2016-11-15 ENCOUNTER — Telehealth: Payer: Self-pay | Admitting: Vascular Surgery

## 2016-11-15 NOTE — Telephone Encounter (Signed)
Sched appt 12/21/16; lab at 10:00 and MD at 11:00. Lm on hm#.

## 2016-11-15 NOTE — Telephone Encounter (Signed)
-----   Message from Mena Goes, RN sent at 11/08/2016 12:15 PM EDT ----- Regarding: 6 weeks w/ duplex   ----- Message ----- From: Gabriel Earing, PA-C Sent: 11/08/2016  11:36 AM To: Vvs Charge Pool  S/p left BC AVF 11/08/16.  F/u with Dr. Scot Dock in 6 weeks with duplex.  Thanks

## 2016-12-02 ENCOUNTER — Encounter (INDEPENDENT_AMBULATORY_CARE_PROVIDER_SITE_OTHER): Payer: Self-pay | Admitting: Family

## 2016-12-02 ENCOUNTER — Ambulatory Visit (INDEPENDENT_AMBULATORY_CARE_PROVIDER_SITE_OTHER): Payer: Medicaid Other | Admitting: Family

## 2016-12-02 VITALS — Ht 73.0 in | Wt 217.0 lb

## 2016-12-02 DIAGNOSIS — L97522 Non-pressure chronic ulcer of other part of left foot with fat layer exposed: Secondary | ICD-10-CM | POA: Diagnosis not present

## 2016-12-02 DIAGNOSIS — G629 Polyneuropathy, unspecified: Secondary | ICD-10-CM

## 2016-12-02 DIAGNOSIS — B351 Tinea unguium: Secondary | ICD-10-CM

## 2016-12-02 DIAGNOSIS — F17200 Nicotine dependence, unspecified, uncomplicated: Secondary | ICD-10-CM

## 2016-12-02 DIAGNOSIS — E10621 Type 1 diabetes mellitus with foot ulcer: Secondary | ICD-10-CM

## 2016-12-02 NOTE — Progress Notes (Signed)
Office Visit Note   Patient: Cody Webster           Date of Birth: 10-08-1965           MRN: [id number] Visit Date: 12/02/2016              Requested by: Kerin Perna, NP [place], [place] [address] PCP: Kerin Perna, NP  Chief Complaint  Patient presents with  . Left Foot - Pain      HPI: The patient is a 51 year old gentleman who presents today for evaluation of ulceration to his left foot plantar surface as well as for nail trim. The patient has been lost to follow-up several times over the last year.  today is without concern does request trimming of the ulcer and callus. Once the surrounding callus to the plantar ulcer has become painful does complain of some moderate drainage. His been applying triple antibiotic ointment daily.  Assessment & Plan: Visit Diagnoses:  1. 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 Peripheral polyneuropathy   3. Tobacco use disorder     Plan: Ulcer debridement of skin and soft tissue. Nails trimmed 5. Dial soap cleansing antibiotic ointment and a Band-Aid dressing change daily.  Follow-Up Instructions: Return in about 4 weeks (around 12/30/2016).   Ortho Exam  Patient is alert, oriented, no adenopathy, well-dressed, normal affect, normal respiratory effort. Examination patient is a strong dorsalis pedis pulse he has no venous stasis ulcers or swelling there is no ascending cellulitis he has an ulcer beneath the fourth and fifth metatarsal heads. After informed consent a 10 blade knife was used to debride the skin and soft tissue back to bleeding viable granulation tissue silver nitrate was used for hemostasis the ulcer is 3 cm in diameter 5 mm deep with healthy granulation tissue the base this does not probe to bone or tendon. No surrounding erythema or drainage today.   Nails are thickened and onychomycotic 5. He is unable to safely  trim his own nails. These were trimmed today in office without incident.  Imaging: No results found.  Labs: Lab Results  Component Value Date   HGBA1C 6.7 (H) 04/23/2016   HGBA1C 10.7 (H) 09/06/2014   HGBA1C 6.2 (H) 12/11/2013   ESRSEDRATE 25 (H) 12/10/2013   CRP <0.5 (L) 12/10/2013   REPTSTATUS 04/30/2016 FINAL 04/24/2016   REPTSTATUS 04/30/2016 FINAL 04/24/2016   CULT  04/24/2016    NO GROWTH 5 DAYS Performed at [place]  04/24/2016    NO GROWTH 5 DAYS Performed at [facility]     Orders:  No orders of the defined types were placed in this encounter.  No orders of the defined types were placed in this encounter.    Procedures: No procedures performed  Clinical Data: No additional findings.  ROS:  All other systems negative, except as noted in the HPI. Review of Systems  Constitutional: Negative for chills and fever.  Skin: Positive for wound.    Objective: Vital Signs: Ht 6\' 1"  (1.854 m)   Wt 217 lb (98.4 kg)   BMI 28.63 kg/m   Specialty Comments:  No specialty comments available.  PMFS History: Patient Active Problem List   Diagnosis Date Noted  . Preop cardiovascular exam 11/04/2016  . Streptococcal pneumonia ([place]) 04/27/2016  . CAP (community acquired pneumonia) 04/23/2016  . Malnutrition of moderate degree ([place])  01/12/2015  . Chronic diastolic heart failure ([place]) 01/11/2015  . Toxic metabolic encephalopathy 79/06/4095  . AKI (acute kidney injury) ([place]) 01/11/2015  . DM type 2, uncontrolled, with neuropathy ([place]) 01/11/2015  . Hyperosmolar non-ketotic state in patient with type 2 diabetes mellitus ([place]) 01/11/2015  . Hyperglycemic hyperosmolar nonketotic coma ([place]) 01/11/2015  . Non-ketotic hyperglycinemia, type II ([employer]) 01/11/2015  . Glaucoma   . Hypertensive urgency 09/25/2014  . Eye pain   . Hypoglycemia 09/06/2014  . Acute glaucoma of right eye 09/06/2014  . Acute-on-chronic kidney injury ([place]) 09/06/2014  .  Anemia 09/06/2014  . Tobacco use disorder 09/06/2014  . Frequent headaches 07/11/2014  . Hyperglycemia 07/11/2014  . 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 CKD (chronic kidney disease), stage IV ([place]) 07/11/2014  . Hyperglycemia due to type 2 diabetes mellitus ([place]) 07/11/2014  . Ataxia 07/11/2014  . CKD (chronic kidney disease) 12/11/2013  . Diabetic foot ulcer ([place]) 12/10/2013  . Essential hypertension 12/10/2013  . Peripheral neuropathy ([place]) 12/10/2013   Past Medical History:  Diagnosis Date  . Carotid artery occlusion   . CHF (congestive heart failure) ([place])   . 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 Diabetes mellitus without complication ([place])    type 2  . GERD (gastroesophageal reflux disease)   . Hypertension   . Peripheral neuropathy   . Pneumonia   . Seizures ([place])    27 years ago    Family History  Problem Relation Age of Onset  . CAD Mother   . Diabetes type II Father   . Hypertension Sister   . CAD Sister   . Diabetes type II Brother     Past Surgical History:  Procedure Laterality Date  . AV FISTULA PLACEMENT Left 11/08/2016   Procedure: ARTERIOVENOUS (AV) FISTULA CREATION;  Surgeon: Angelia Mould, MD;  Location[facility] OR;  Service: Vascular;  Laterality: Left;  . right foot surgery    . TOE AMPUTATION     Social History   Occupational History  .      Disabled - for Back pain & foot ulcer   Social History Main Topics  . Smoking status: Current Every Day Smoker    Packs/day: 1.00    Years: 30.00    Types: Cigarettes, Cigars  . Smokeless tobacco: Former Systems developer    Types: Snuff, Chew    Quit date: 12/11/1983  . Alcohol use Yes     Comment: ocassaionlly   . Drug use: No  . Sexual activity: Not Currently

## 2016-12-06 ENCOUNTER — Encounter: Payer: Self-pay | Admitting: Vascular Surgery

## 2016-12-13 ENCOUNTER — Other Ambulatory Visit: Payer: Self-pay

## 2016-12-13 DIAGNOSIS — Z4931 Encounter for adequacy testing for hemodialysis: Secondary | ICD-10-CM

## 2016-12-21 ENCOUNTER — Encounter: Payer: Medicaid Other | Admitting: Vascular Surgery

## 2016-12-21 ENCOUNTER — Encounter (HOSPITAL_COMMUNITY): Payer: Medicaid Other

## 2016-12-22 ENCOUNTER — Ambulatory Visit (INDEPENDENT_AMBULATORY_CARE_PROVIDER_SITE_OTHER): Payer: Self-pay | Admitting: Vascular Surgery

## 2016-12-22 ENCOUNTER — Encounter: Payer: Self-pay | Admitting: *Deleted

## 2016-12-22 ENCOUNTER — Encounter: Payer: Self-pay | Admitting: Vascular Surgery

## 2016-12-22 ENCOUNTER — Ambulatory Visit (HOSPITAL_COMMUNITY)
Admission: RE | Admit: 2016-12-22 | Discharge: 2016-12-22 | Disposition: A | Discharge status: Home / Self Care | Payer: Medicaid Other | Source: Ambulatory Visit | Attending: Vascular Surgery | Admitting: Vascular Surgery

## 2016-12-22 VITALS — BP 166/80 | HR 86 | Temp 98.3°F | Resp 16 | Ht 73.0 in | Wt 217.0 lb

## 2016-12-22 DIAGNOSIS — Z992 Dependence on renal dialysis: Secondary | ICD-10-CM

## 2016-12-22 DIAGNOSIS — Z4931 Encounter for adequacy testing for hemodialysis: Secondary | ICD-10-CM | POA: Diagnosis not present

## 2016-12-22 DIAGNOSIS — N186 End stage renal disease: Secondary | ICD-10-CM

## 2016-12-22 MED ORDER — CEPHALEXIN 500 MG PO CAPS: 500.0000 mg | ORAL_CAPSULE | Freq: Two times a day (BID) | ORAL | 0 refills | Status: DC

## 2016-12-22 NOTE — Progress Notes (Signed)
Patient walked in to the office today wanting to be seen for pain, swelling in his left arm and "pus" drainage x several weeks per his Walk In form. He no showed for his appt with Dr. Scot Dock yesterday; he also was scheduled for a postop duplex. Patient is not currently on HD. I have made him an appt for his duplex at 3:00pm and he will see our PA after that per Dr. Oneida Alar.

## 2016-12-23 NOTE — Progress Notes (Signed)
Patient is a 51 year old male who returns for follow-up today. He underwent placement of a left brachiocephalic AV fistula by Dr. Scot Dock on June 26. He denies any numbness or tingling in his hand. He states he has had some incisional drainage. He denies any fever or chills.  Physical exam:  Vitals:   12/22/16 1608  BP: (!) 166/80  Pulse: 86  Resp: 16  Temp: 98.3 F (36.8 C)  TempSrc: Oral  SpO2: 98%  Weight: 217 lb (98.4 kg)  Height: 6\' 1"  (1.854 m)    Left upper extremity: 2 areas of slightly raised skin with mucopurulent discharge that appear to be suture material reaction rather than deep infection palpable thrill audible bruit in fistula. The 2 raised areas were opened slightly to allow better drainage.  Data: Patient had a duplex ultrasound of his AV fistula today which showed the fistula diameter is 6-7 mm less than 5 mm from the skin surface  Assessment: Superficial skin infection secondary to suture material.  Plan:  The patient was started on a 10 day course of Keflex 500 mg twice a day today. Fistula should be ready for use in September. The patient will follow-up on as-needed basis if he has any further drainage from his incision after his antibiotic course.  Ruta Hinds, MD [facility] [place] Office: [phone] Pager: [phone]

## 2016-12-29 ENCOUNTER — Telehealth (INDEPENDENT_AMBULATORY_CARE_PROVIDER_SITE_OTHER): Payer: Self-pay

## 2016-12-29 NOTE — Telephone Encounter (Signed)
Talked with patient niece and rescheduled patient appointment due to patient's phone being out of service.

## 2016-12-29 NOTE — Telephone Encounter (Signed)
-----   Message from Suzan Slick, NP sent at 12/29/2016 10:21 AM EDT ----- Will you reschedule him, could he do afternoon or anytime next week

## 2016-12-30 ENCOUNTER — Ambulatory Visit (INDEPENDENT_AMBULATORY_CARE_PROVIDER_SITE_OTHER): Payer: Medicaid Other | Admitting: Family

## 2017-01-02 ENCOUNTER — Encounter (INDEPENDENT_AMBULATORY_CARE_PROVIDER_SITE_OTHER): Payer: Self-pay | Admitting: Family

## 2017-01-02 ENCOUNTER — Ambulatory Visit (INDEPENDENT_AMBULATORY_CARE_PROVIDER_SITE_OTHER): Payer: Medicaid Other | Admitting: Family

## 2017-01-02 VITALS — Ht 73.0 in | Wt 217.0 lb

## 2017-01-02 DIAGNOSIS — E10621 Type 1 diabetes mellitus with foot ulcer: Secondary | ICD-10-CM

## 2017-01-02 DIAGNOSIS — L97522 Non-pressure chronic ulcer of other part of left foot with fat layer exposed: Secondary | ICD-10-CM

## 2017-01-02 DIAGNOSIS — G629 Polyneuropathy, unspecified: Secondary | ICD-10-CM

## 2017-01-02 NOTE — Progress Notes (Signed)
Office Visit Note   Patient: Cody Webster           Date of Birth: [birth year]           MRN: [id number] Visit Date: 01/02/2017              Requested by: Kerin Perna, NP [address] [place], [place] [address] PCP: Kerin Perna, NP  Chief Complaint  Patient presents with  . Left Foot - Follow-up      HPI: The patient is a 51 year old gentleman who presents today for evaluation of ulceration to his left foot plantar surface. The patient has been lost to follow-up several times over the last year.  today is without concern does request trimming of the ulcer and callus. His been applying triple antibiotic ointment daily.  Assessment & Plan: Visit Diagnoses:  1. 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 Peripheral polyneuropathy     Plan: Ulcer debridement of skin and soft tissue.  Dial soap cleansing antibiotic ointment and a Band-Aid dressing change daily.  Follow-Up Instructions: Return in about 4 weeks (around 01/30/2017).   Ortho Exam  Patient is alert, oriented, no adenopathy, well-dressed, normal affect, normal respiratory effort. Examination patient is a strong dorsalis pedis pulse he has no venous stasis ulcers or swelling there is no ascending cellulitis he has an ulcer beneath the fourth and fifth metatarsal heads. After informed consent a 10 blade knife was used to debride the surrounding skin and soft tissue back to bleeding viable granulation tissue. the ulcer is 15 mm in diameter 3 mm deep with healthy granulation tissue the base this does not probe to bone or tendon. No surrounding erythema or drainage today.    Imaging: No results found.  Labs: Lab Results  Component Value Date   HGBA1C 6.7 (H) 04/23/2016   HGBA1C 10.7 (H) 09/06/2014   HGBA1C 6.2 (H) 12/11/2013   ESRSEDRATE 25 (H) 12/10/2013   CRP <0.5 (L) 12/10/2013   REPTSTATUS 04/30/2016 FINAL  04/24/2016   REPTSTATUS 04/30/2016 FINAL 04/24/2016   CULT  04/24/2016    NO GROWTH 5 DAYS Performed at [facility]  04/24/2016    NO GROWTH 5 DAYS Performed at [facility]     Orders:  No orders of the defined types were placed in this encounter.  No orders of the defined types were placed in this encounter.    Procedures: No procedures performed  Clinical Data: No additional findings.  ROS:  All other systems negative, except as noted in the HPI. Review of Systems  Constitutional: Negative for chills and fever.  Skin: Positive for wound.    Objective: Vital Signs: Ht 6\' 1"  (1.854 m)   Wt 217 lb (98.4 kg)   BMI 28.63 kg/m   Specialty Comments:  No specialty comments available.  PMFS History: Patient Active Problem List   Diagnosis Date Noted  . Preop cardiovascular exam 11/04/2016  . Streptococcal pneumonia ([place]) 04/27/2016  . CAP (community acquired pneumonia) 04/23/2016  . Malnutrition of moderate degree ([place]) 01/12/2015  . Chronic diastolic heart failure ([place]) 01/11/2015  . Toxic metabolic encephalopathy 81/05/7508  . AKI (acute kidney injury) ([place]) 01/11/2015  . DM type 2, uncontrolled, with neuropathy ([place]) 01/11/2015  . Hyperosmolar non-ketotic state in patient with type 2 diabetes mellitus ([place]) 01/11/2015  . Hyperglycemic hyperosmolar nonketotic coma ([place]) 01/11/2015  . Non-ketotic hyperglycinemia,  type II ([place]) 01/11/2015  . Glaucoma   . Hypertensive urgency 09/25/2014  . Eye pain   . Hypoglycemia 09/06/2014  . Acute glaucoma of right eye 09/06/2014  . Acute-on-chronic kidney injury ([place]) 09/06/2014  . Anemia 09/06/2014  . Tobacco use disorder 09/06/2014  . Frequent headaches 07/11/2014  . Hyperglycemia 07/11/2014  . 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 CKD (chronic kidney disease), stage IV ([place]) 07/11/2014  . Hyperglycemia due to type 2 diabetes mellitus ([place]) 07/11/2014  . Ataxia 07/11/2014  . CKD  (chronic kidney disease) 12/11/2013  . Diabetic foot ulcer ([place]) 12/10/2013  . Essential hypertension 12/10/2013  . Peripheral neuropathy ([place]) 12/10/2013   Past Medical History:  Diagnosis Date  . Carotid artery occlusion   . CHF (congestive heart failure) ([place])   . 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 Diabetes mellitus without complication ([place])    type 2  . GERD (gastroesophageal reflux disease)   . Hypertension   . Peripheral neuropathy   . Pneumonia   . Seizures ([place])    27 years ago    Family History  Problem Relation Age of Onset  . CAD Mother   . Diabetes type II Father   . Hypertension Sister   . CAD Sister   . Diabetes type II Brother     Past Surgical History:  Procedure Laterality Date  . AV FISTULA PLACEMENT Left 11/08/2016   Procedure: ARTERIOVENOUS (AV) FISTULA CREATION;  Surgeon: Angelia Mould, MD;  Location: [facility] OR;  Service: Vascular;  Laterality: Left;  . right foot surgery    . TOE AMPUTATION     Social History   Occupational History  .      Disabled - for Back pain & foot ulcer   Social History Main Topics  . Smoking status: Current Every Day Smoker    Packs/day: 1.00    Years: 30.00    Types: Cigarettes, Cigars  . Smokeless tobacco: Former Systems developer    Types: Snuff, Chew    Quit date: 12/11/1983  . Alcohol use Yes     Comment: ocassaionlly   . Drug use: No  . Sexual activity: Not Currently

## 2017-01-07 ENCOUNTER — Other Ambulatory Visit (INDEPENDENT_AMBULATORY_CARE_PROVIDER_SITE_OTHER): Payer: Self-pay | Admitting: Orthopedic Surgery

## 2017-02-02 ENCOUNTER — Encounter (INDEPENDENT_AMBULATORY_CARE_PROVIDER_SITE_OTHER): Payer: Self-pay | Admitting: Family

## 2017-02-02 ENCOUNTER — Ambulatory Visit (INDEPENDENT_AMBULATORY_CARE_PROVIDER_SITE_OTHER): Payer: Medicaid Other | Admitting: Family

## 2017-02-02 DIAGNOSIS — E10621 Type 1 diabetes mellitus with foot ulcer: Secondary | ICD-10-CM | POA: Diagnosis not present

## 2017-02-02 DIAGNOSIS — L97522 Non-pressure chronic ulcer of other part of left foot with fat layer exposed: Secondary | ICD-10-CM

## 2017-02-02 NOTE — Progress Notes (Signed)
Office Visit Note   Patient: Cody Webster           Date of Birth: [birth year]           MRN: [id number] Visit Date: 02/02/2017              Requested by: Kerin Perna, NP [place], [place] [address] PCP: Kerin Perna, NP  Chief Complaint  Patient presents with  . Left Foot - Wound Check      HPI: Patient is a 51 year old gentleman diabetic insensate neuropathy with a Wagner grade 1 ulcer left fourth metatarsal head. Patient states the Darco shoe does help but he states he cannot wear it because it makes his feet sweat. Patient states he also has been getting larger.  Assessment & Plan: Visit Diagnoses:  1. 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  Follow-Up Instructions: Return in about 3 weeks (around 02/23/2017).   Ortho Exam  Patient is alert, oriented, no adenopathy, well-dressed, normal affect, normal respiratory effort. Indications patient has a palpable dorsalis pedis pulse he has a large Wagner grade 1 ulcer beneath the fourth metatarsal head with a large amount of callus. After informed consent a 10 blade knife was used to debride the skin and soft tissue back to healthy viable granulation tissue this was touched with silver nitrate. The ulcers 2 cm in diameter and 3 mm deep.  Imaging: No results found. No images are attached to the encounter.  Labs: Lab Results  Component Value Date   HGBA1C 6.7 (H) 04/23/2016   HGBA1C 10.7 (H) 09/06/2014   HGBA1C 6.2 (H) 12/11/2013   ESRSEDRATE 25 (H) 12/10/2013   CRP <0.5 (L) 12/10/2013   REPTSTATUS 04/30/2016 FINAL 04/24/2016   REPTSTATUS 04/30/2016 FINAL 04/24/2016   CULT  04/24/2016    NO GROWTH 5 DAYS Performed at [facility]   04/24/2016    NO GROWTH 5 DAYS Performed at [facility]     Orders:  No orders of the defined types were placed in this encounter.  No orders of the defined types were placed in this encounter.    Procedures: No procedures performed  Clinical Data: No additional findings.  ROS:  All other systems negative, except as noted in the HPI. Review of Systems  Objective: Vital Signs: There were no vitals taken for this visit.  Specialty Comments:  No specialty comments available.  PMFS History: Patient Active Problem List   Diagnosis Date Noted  . Preop cardiovascular exam 11/04/2016  . Streptococcal pneumonia ([place]) 04/27/2016  . CAP (community acquired pneumonia) 04/23/2016  . Malnutrition of moderate degree ([place]) 01/12/2015  . Chronic diastolic heart failure ([place]) 01/11/2015  . Toxic metabolic encephalopathy 17/49/4496  . AKI (acute kidney injury) ([place]) 01/11/2015  . DM type 2, uncontrolled, with neuropathy ([place]) 01/11/2015  . Hyperosmolar non-ketotic state in patient with type 2 diabetes mellitus ([place]) 01/11/2015  . Hyperglycemic hyperosmolar nonketotic coma ([place]) 01/11/2015  . Non-ketotic hyperglycinemia, type II ([place]) 01/11/2015  . Glaucoma   . Hypertensive urgency 09/25/2014  . Eye pain   . Hypoglycemia 09/06/2014  . Acute glaucoma of right eye 09/06/2014  . Acute-on-chronic kidney injury ([place]) 09/06/2014  . Anemia 09/06/2014  . Tobacco use disorder  09/06/2014  . Frequent headaches 07/11/2014  . Hyperglycemia 07/11/2014  . 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 CKD (chronic kidney disease), stage IV ([place]) 07/11/2014  . Hyperglycemia due to type 2 diabetes mellitus ([place]) 07/11/2014  . Ataxia 07/11/2014  . CKD (chronic kidney disease) 12/11/2013  . Diabetic foot ulcer ([place]) 12/10/2013  . Essential hypertension 12/10/2013  . Peripheral neuropathy ([place]) 12/10/2013   Past Medical History:  Diagnosis Date  . Carotid artery occlusion   . CHF  (congestive heart failure) ([place])   . 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 Diabetes mellitus without complication ([place])    type 2  . GERD (gastroesophageal reflux disease)   . Hypertension   . Peripheral neuropathy   . Pneumonia   . Seizures ([place])    27 years ago    Family History  Problem Relation Age of Onset  . CAD Mother   . Diabetes type II Father   . Hypertension Sister   . CAD Sister   . Diabetes type II Brother     Past Surgical History:  Procedure Laterality Date  . AV FISTULA PLACEMENT Left 11/08/2016   Procedure: ARTERIOVENOUS (AV) FISTULA CREATION;  Surgeon: Angelia Mould, MD;  Location: [facility] OR;  Service: Vascular;  Laterality: Left;  . right foot surgery    . TOE AMPUTATION     Social History   Occupational History  .      Disabled - for Back pain & foot ulcer   Social History Main Topics  . Smoking status: Current Every Day Smoker    Packs/day: 1.00    Years: 30.00    Types: Cigarettes, Cigars  . Smokeless tobacco: Former Systems developer    Types: Snuff, Chew    Quit date: 12/11/1983  . Alcohol use Yes     Comment: ocassaionlly   . Drug use: No  . Sexual activity: Not Currently

## 2017-02-23 ENCOUNTER — Ambulatory Visit (INDEPENDENT_AMBULATORY_CARE_PROVIDER_SITE_OTHER): Payer: Medicaid Other | Admitting: Orthopedic Surgery

## 2017-03-01 ENCOUNTER — Ambulatory Visit (INDEPENDENT_AMBULATORY_CARE_PROVIDER_SITE_OTHER): Payer: Medicaid Other | Admitting: Orthopedic Surgery

## 2017-03-01 ENCOUNTER — Ambulatory Visit (INDEPENDENT_AMBULATORY_CARE_PROVIDER_SITE_OTHER): Payer: Medicaid Other | Admitting: Family

## 2017-03-01 DIAGNOSIS — E1140 Type 2 diabetes mellitus with diabetic neuropathy, unspecified: Secondary | ICD-10-CM

## 2017-03-01 DIAGNOSIS — IMO0002 Reserved for concepts with insufficient information to code with codable children: Secondary | ICD-10-CM

## 2017-03-01 DIAGNOSIS — L97522 Non-pressure chronic ulcer of other part of left foot with fat layer exposed: Secondary | ICD-10-CM | POA: Diagnosis not present

## 2017-03-01 DIAGNOSIS — E10621 Type 1 diabetes mellitus with foot ulcer: Secondary | ICD-10-CM

## 2017-03-01 DIAGNOSIS — E1165 Type 2 diabetes mellitus with hyperglycemia: Secondary | ICD-10-CM | POA: Diagnosis not present

## 2017-03-01 NOTE — Progress Notes (Signed)
Office Visit Note   Patient: Cody Webster           Date of Birth: 06/15/1965           MRN: [id number] Visit Date: 03/01/2017              Requested by: Kerin Perna, NP [place], [place] [address] PCP: Kerin Perna, NP  No chief complaint on file.     HPI: Patient is a 51 year old gentleman diabetic insensate neuropathy with a Wagner grade 1 ulcer left fourth metatarsal head. Has been in regular shoe wear doing gauze dressing changes.    Assessment & Plan: Visit Diagnoses:  1. 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 DM type 2, uncontrolled, with neuropathy ([place])     Plan: recommended using the Darco shoe. continue the antibiotic ointment dressing changes daily following wound cleansing. Minimize pressure on forefoot. Will refer for ABIs.  Follow-Up Instructions: Return in about 4 weeks (around 03/29/2017).   Ortho Exam  Patient is alert, oriented, no adenopathy, well-dressed, normal affect, normal respiratory effort. On examination patient has a palpable dorsalis pedis pulse he has a large Wagner grade 1 ulcer beneath the fourth metatarsal head. After informed consent a 10 blade knife was used to debride the skin and soft tissue back to healthy viable granulation tissue this was touched with silver nitrate. The ulcers is 10 cm in diameter and 3 mm deep. No odor, drainage or surrounding erythema.  Imaging: No results found. No images are attached to the encounter.  Labs: Lab Results  Component Value Date   HGBA1C 6.7 (H) 04/23/2016   HGBA1C 10.7 (H) 09/06/2014   HGBA1C 6.2 (H) 12/11/2013   ESRSEDRATE 25 (H) 12/10/2013   CRP <0.5 (L) 12/10/2013   REPTSTATUS 04/30/2016 FINAL 04/24/2016   REPTSTATUS 04/30/2016 FINAL 04/24/2016   CULT  04/24/2016    NO GROWTH 5 DAYS Performed at [facility]  04/24/2016    NO GROWTH 5 DAYS Performed at  [facility]     Orders:  No orders of the defined types were placed in this encounter.  No orders of the defined types were placed in this encounter.    Procedures: No procedures performed  Clinical Data: No additional findings.  ROS:  All other systems negative, except as noted in the HPI. Review of Systems  Constitutional: Negative for chills and fever.  Cardiovascular: Negative for leg swelling.  Skin: Positive for wound. Negative for color change.    Objective: Vital Signs: There were no vitals taken for this visit.  Specialty Comments:  No specialty comments available.  PMFS History: Patient Active Problem List   Diagnosis Date Noted  . Preop cardiovascular exam 11/04/2016  . Streptococcal pneumonia ([place]) 04/27/2016  . CAP (community acquired pneumonia) 04/23/2016  . Malnutrition of moderate degree ([place]) 01/12/2015  . Chronic diastolic heart failure ([place]) 01/11/2015  . Toxic metabolic encephalopathy 22/97/9892  . AKI (acute kidney injury) ([place]) 01/11/2015  . DM type 2, uncontrolled, with neuropathy ([place]) 01/11/2015  . Hyperosmolar non-ketotic state in patient with type 2 diabetes mellitus ([place]) 01/11/2015  . Hyperglycemic hyperosmolar nonketotic coma ([place]) 01/11/2015  . Non-ketotic hyperglycinemia, type II ([place]) 01/11/2015  . Glaucoma   . Hypertensive urgency 09/25/2014  . Eye pain   . Hypoglycemia 09/06/2014  . Acute glaucoma of right eye 09/06/2014  . Acute-on-chronic kidney  injury ([place]) 09/06/2014  . Anemia 09/06/2014  . Tobacco use disorder 09/06/2014  . Frequent headaches 07/11/2014  . Hyperglycemia 07/11/2014  . 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 CKD (chronic kidney disease), stage IV ([place]) 07/11/2014  . Hyperglycemia due to type 2 diabetes mellitus ([place]) 07/11/2014  . Ataxia 07/11/2014  . CKD (chronic kidney disease) 12/11/2013  . Diabetic foot ulcer ([place]) 12/10/2013  . Essential hypertension 12/10/2013  . Peripheral  neuropathy ([place]) 12/10/2013   Past Medical History:  Diagnosis Date  . Carotid artery occlusion   . CHF (congestive heart failure) ([place])   . 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 Diabetes mellitus without complication ([place])    type 2  . GERD (gastroesophageal reflux disease)   . Hypertension   . Peripheral neuropathy   . Pneumonia   . Seizures ([place])    27 years ago    Family History  Problem Relation Age of Onset  . CAD Mother   . Diabetes type II Father   . Hypertension Sister   . CAD Sister   . Diabetes type II Brother     Past Surgical History:  Procedure Laterality Date  . AV FISTULA PLACEMENT Left 11/08/2016   Procedure: ARTERIOVENOUS (AV) FISTULA CREATION;  Surgeon: Angelia Mould, MD;  Location: [facility] OR;  Service: Vascular;  Laterality: Left;  . right foot surgery    . TOE AMPUTATION     Social History   Occupational History  .      Disabled - for Back pain & foot ulcer   Social History Main Topics  . Smoking status: Current Every Day Smoker    Packs/day: 1.00    Years: 30.00    Types: Cigarettes, Cigars  . Smokeless tobacco: Former Systems developer    Types: Snuff, Chew    Quit date: 12/11/1983  . Alcohol use Yes     Comment: ocassaionlly   . Drug use: No  . Sexual activity: Not Currently

## 2017-03-02 ENCOUNTER — Telehealth (INDEPENDENT_AMBULATORY_CARE_PROVIDER_SITE_OTHER): Payer: Self-pay | Admitting: *Deleted

## 2017-03-02 NOTE — Telephone Encounter (Signed)
-----   Message from Suzan Slick, NP sent at 03/01/2017  3:51 PM EDT ----- Regarding: FW: Mc[place] Authorization needed for 03/06/17 Can you do this?  ----- Message ----- From: Rufina Falco Sent: 03/01/2017   3:13 PM To: Suzan Slick, NP Subject: Mcaid Authorization needed for 03/06/17        He is scheduled for ABI's 03/06/17.  Please have your staff get [place] a Medicaid authorization. (the patient is aware of the appt)  Rip Harbour   ----- Message ----- From: Suzan Slick, NP Sent: 03/01/2017   2:04 PM To: Gardiner Fanti Reaves  Would you mind scheduling for ABIs, not urgent, order in epic thanks

## 2017-03-02 NOTE — Telephone Encounter (Signed)
Submitted authorization thru [employer], went into medical review, pending authorization, Case/Service Order: [id number]

## 2017-03-06 ENCOUNTER — Ambulatory Visit (HOSPITAL_COMMUNITY)
Admission: RE | Admit: 2017-03-06 | Discharge: 2017-03-06 | Disposition: A | Discharge status: Home / Self Care | Payer: Medicaid Other | Source: Ambulatory Visit | Attending: Surgery | Admitting: Surgery

## 2017-03-06 DIAGNOSIS — E10621 Type 1 diabetes mellitus with foot ulcer: Secondary | ICD-10-CM

## 2017-03-06 DIAGNOSIS — Z87891 Personal history of nicotine dependence: Secondary | ICD-10-CM | POA: Diagnosis not present

## 2017-03-06 DIAGNOSIS — I10 Essential (primary) hypertension: Secondary | ICD-10-CM | POA: Insufficient documentation

## 2017-03-06 DIAGNOSIS — L97522 Non-pressure chronic ulcer of other part of left foot with fat layer exposed: Secondary | ICD-10-CM | POA: Diagnosis not present

## 2017-03-29 ENCOUNTER — Ambulatory Visit (INDEPENDENT_AMBULATORY_CARE_PROVIDER_SITE_OTHER): Payer: Medicaid Other | Admitting: Family

## 2017-03-29 ENCOUNTER — Encounter (INDEPENDENT_AMBULATORY_CARE_PROVIDER_SITE_OTHER): Payer: Self-pay | Admitting: Family

## 2017-03-29 VITALS — Ht 73.0 in | Wt 217.0 lb

## 2017-03-29 DIAGNOSIS — E1165 Type 2 diabetes mellitus with hyperglycemia: Secondary | ICD-10-CM

## 2017-03-29 DIAGNOSIS — L97522 Non-pressure chronic ulcer of other part of left foot with fat layer exposed: Secondary | ICD-10-CM

## 2017-03-29 DIAGNOSIS — E10621 Type 1 diabetes mellitus with foot ulcer: Secondary | ICD-10-CM

## 2017-03-29 DIAGNOSIS — E1140 Type 2 diabetes mellitus with diabetic neuropathy, unspecified: Secondary | ICD-10-CM | POA: Diagnosis not present

## 2017-03-29 DIAGNOSIS — IMO0002 Reserved for concepts with insufficient information to code with codable children: Secondary | ICD-10-CM

## 2017-03-29 NOTE — Progress Notes (Signed)
Office Visit Note   Patient: Cody Webster           Date of Birth: 1966-02-07           MRN: [id number] Visit Date: 03/29/2017              Requested by: Kerin Perna, NP [place], [place] [address] PCP: Kerin Perna, NP  Chief Complaint  Patient presents with  . Left Foot - Follow-up    Ulcer under 5th MTH left foot      HPI: Patient is a 51 year old gentleman diabetic insensate neuropathy with a Wagner grade 1 ulcer left fourth metatarsal head. Has been in regular shoe wear doing gauze dressing changes. Today is in knee high compression stockings.   Did have bilateral lower extremity ABIs which were WNL on 03/06/17.   Assessment & Plan: Visit Diagnoses:  1. 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 DM type 2, uncontrolled, with neuropathy ([place])     Plan: recommended using the Darco shoe. continue the antibiotic ointment dressing changes daily following wound cleansing. Minimize pressure on forefoot.  Follow-Up Instructions: Return in about 4 weeks (around 04/26/2017).   Ortho Exam  Patient is alert, oriented, no adenopathy, well-dressed, normal affect, normal respiratory effort. On examination patient has a palpable dorsalis pedis pulse he has a large Wagner grade 1 ulcer beneath the fourth metatarsal head. After informed consent a 10 blade knife was used to debride the nonviable skin and soft tissue back to healthy viable granulation tissue this was touched with silver nitrate. The ulcers is 8 cm in diameter and 3 mm deep. No odor, drainage or surrounding erythema.  Imaging: No results found. No images are attached to the encounter.  Labs: Lab Results  Component Value Date   HGBA1C 6.7 (H) 04/23/2016   HGBA1C 10.7 (H) 09/06/2014   HGBA1C 6.2 (H) 12/11/2013   ESRSEDRATE 25 (H) 12/10/2013   CRP <0.5 (L) 12/10/2013   REPTSTATUS 04/30/2016 FINAL 04/24/2016    REPTSTATUS 04/30/2016 FINAL 04/24/2016   CULT  04/24/2016    NO GROWTH 5 DAYS Performed at [facility]  04/24/2016    NO GROWTH 5 DAYS Performed at [facility]     Orders:  No orders of the defined types were placed in this encounter.  No orders of the defined types were placed in this encounter.    Procedures: No procedures performed  Clinical Data: No additional findings.  ROS:  All other systems negative, except as noted in the HPI. Review of Systems  Constitutional: Negative for chills and fever.  Cardiovascular: Negative for leg swelling.  Skin: Positive for wound. Negative for color change.    Objective: Vital Signs: Ht 6\' 1"  (1.854 m)   Wt 217 lb (98.4 kg)   BMI 28.63 kg/m   Specialty Comments:  No specialty comments available.  PMFS History: Patient Active Problem List   Diagnosis Date Noted  . Preop cardiovascular exam 11/04/2016  . Streptococcal pneumonia ([place]) 04/27/2016  . CAP (community acquired pneumonia) 04/23/2016  . Malnutrition of moderate degree ([place]) 01/12/2015  . Chronic diastolic heart failure ([place]) 01/11/2015  . Toxic metabolic encephalopathy 59/56/3875  . AKI (acute kidney injury) ([place]) 01/11/2015  . DM type 2, uncontrolled, with neuropathy ([place]) 01/11/2015  . Hyperosmolar non-ketotic state in patient with type 2 diabetes mellitus ([place]) 01/11/2015  . Hyperglycemic  hyperosmolar nonketotic coma ([place]) 01/11/2015  . Non-ketotic hyperglycinemia, type II ([place]) 01/11/2015  . Glaucoma   . Hypertensive urgency 09/25/2014  . Eye pain   . Hypoglycemia 09/06/2014  . Acute glaucoma of right eye 09/06/2014  . Acute-on-chronic kidney injury ([place]) 09/06/2014  . Anemia 09/06/2014  . Tobacco use disorder 09/06/2014  . Frequent headaches 07/11/2014  . Hyperglycemia 07/11/2014  . 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 CKD (chronic kidney disease), stage IV ([place]) 07/11/2014  . Hyperglycemia due to type 2  diabetes mellitus ([place]) 07/11/2014  . Ataxia 07/11/2014  . CKD (chronic kidney disease) 12/11/2013  . Diabetic foot ulcer ([place]) 12/10/2013  . Essential hypertension 12/10/2013  . Peripheral neuropathy ([place]) 12/10/2013   Past Medical History:  Diagnosis Date  . Carotid artery occlusion   . CHF (congestive heart failure) ([place])   . 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 Diabetes mellitus without complication ([place])    type 2  . GERD (gastroesophageal reflux disease)   . Hypertension   . Peripheral neuropathy   . Pneumonia   . Seizures ([place])    27 years ago    Family History  Problem Relation Age of Onset  . CAD Mother   . Diabetes type II Father   . Hypertension Sister   . CAD Sister   . Diabetes type II Brother     Past Surgical History:  Procedure Laterality Date  . right foot surgery    . TOE AMPUTATION     Social History   Occupational History    Comment: Disabled - for Back pain & foot ulcer  Tobacco Use  . Smoking status: Current Every Day Smoker    Packs/day: 1.00    Years: 30.00    Pack years: 30.00    Types: Cigarettes, Cigars  . Smokeless tobacco: Former Systems developer    Types: Snuff, Sarina Ser    Quit date: 12/11/1983  Substance and Sexual Activity  . Alcohol use: Yes    Comment: ocassaionlly   . Drug use: No  . Sexual activity: Not Currently

## 2017-04-26 ENCOUNTER — Ambulatory Visit (INDEPENDENT_AMBULATORY_CARE_PROVIDER_SITE_OTHER): Payer: Medicaid Other | Admitting: Family

## 2017-05-10 ENCOUNTER — Other Ambulatory Visit (INDEPENDENT_AMBULATORY_CARE_PROVIDER_SITE_OTHER): Payer: Self-pay | Admitting: Orthopedic Surgery

## 2017-07-14 HISTORY — PX: TRANSTHORACIC ECHOCARDIOGRAM: SHX275

## 2017-07-14 HISTORY — PX: NM MYOVIEW LTD: HXRAD82

## 2017-07-20 ENCOUNTER — Encounter (HOSPITAL_COMMUNITY): Payer: Self-pay | Admitting: Emergency Medicine

## 2017-07-20 ENCOUNTER — Emergency Department (HOSPITAL_COMMUNITY): Payer: Medicaid Other

## 2017-07-20 ENCOUNTER — Other Ambulatory Visit: Payer: Self-pay

## 2017-07-20 ENCOUNTER — Inpatient Hospital Stay (HOSPITAL_COMMUNITY)
Admission: EM | Admit: 2017-07-20 | Discharge: 2017-07-22 | DRG: 313 | Disposition: A | Discharge status: Home Health | Payer: Medicaid Other | Attending: Internal Medicine | Admitting: Internal Medicine

## 2017-07-20 DIAGNOSIS — I10 Essential (primary) hypertension: Secondary | ICD-10-CM | POA: Diagnosis not present

## 2017-07-20 DIAGNOSIS — G629 Polyneuropathy, unspecified: Secondary | ICD-10-CM

## 2017-07-20 DIAGNOSIS — E1140 Type 2 diabetes mellitus with diabetic neuropathy, unspecified: Secondary | ICD-10-CM | POA: Diagnosis present

## 2017-07-20 DIAGNOSIS — Z66 Do not resuscitate: Secondary | ICD-10-CM | POA: Diagnosis present

## 2017-07-20 DIAGNOSIS — R0789 Other chest pain: Secondary | ICD-10-CM

## 2017-07-20 DIAGNOSIS — E875 Hyperkalemia: Secondary | ICD-10-CM | POA: Diagnosis present

## 2017-07-20 DIAGNOSIS — N179 Acute kidney failure, unspecified: Secondary | ICD-10-CM | POA: Diagnosis not present

## 2017-07-20 DIAGNOSIS — Z7984 Long term (current) use of oral hypoglycemic drugs: Secondary | ICD-10-CM | POA: Diagnosis not present

## 2017-07-20 DIAGNOSIS — N184 Chronic kidney disease, stage 4 (severe): Secondary | ICD-10-CM | POA: Diagnosis present

## 2017-07-20 DIAGNOSIS — Z7982 Long term (current) use of aspirin: Secondary | ICD-10-CM | POA: Diagnosis not present

## 2017-07-20 DIAGNOSIS — L97529 Non-pressure chronic ulcer of other part of left foot with unspecified severity: Secondary | ICD-10-CM | POA: Diagnosis present

## 2017-07-20 DIAGNOSIS — E1122 Type 2 diabetes mellitus with diabetic chronic kidney disease: Secondary | ICD-10-CM | POA: Diagnosis present

## 2017-07-20 DIAGNOSIS — M1990 Unspecified osteoarthritis, unspecified site: Secondary | ICD-10-CM | POA: Diagnosis present

## 2017-07-20 DIAGNOSIS — E785 Hyperlipidemia, unspecified: Secondary | ICD-10-CM | POA: Diagnosis present

## 2017-07-20 DIAGNOSIS — E11621 Type 2 diabetes mellitus with foot ulcer: Secondary | ICD-10-CM | POA: Diagnosis present

## 2017-07-20 DIAGNOSIS — G8929 Other chronic pain: Secondary | ICD-10-CM | POA: Diagnosis present

## 2017-07-20 DIAGNOSIS — N189 Chronic kidney disease, unspecified: Secondary | ICD-10-CM | POA: Diagnosis not present

## 2017-07-20 DIAGNOSIS — R072 Precordial pain: Secondary | ICD-10-CM | POA: Diagnosis not present

## 2017-07-20 DIAGNOSIS — R627 Adult failure to thrive: Secondary | ICD-10-CM | POA: Diagnosis present

## 2017-07-20 DIAGNOSIS — F17210 Nicotine dependence, cigarettes, uncomplicated: Secondary | ICD-10-CM | POA: Diagnosis present

## 2017-07-20 DIAGNOSIS — I129 Hypertensive chronic kidney disease with stage 1 through stage 4 chronic kidney disease, or unspecified chronic kidney disease: Secondary | ICD-10-CM | POA: Diagnosis not present

## 2017-07-20 DIAGNOSIS — M545 Low back pain: Secondary | ICD-10-CM | POA: Diagnosis present

## 2017-07-20 DIAGNOSIS — H409 Unspecified glaucoma: Secondary | ICD-10-CM | POA: Diagnosis present

## 2017-07-20 DIAGNOSIS — N170 Acute kidney failure with tubular necrosis: Secondary | ICD-10-CM | POA: Diagnosis not present

## 2017-07-20 DIAGNOSIS — R0600 Dyspnea, unspecified: Secondary | ICD-10-CM | POA: Diagnosis not present

## 2017-07-20 DIAGNOSIS — Z89421 Acquired absence of other right toe(s): Secondary | ICD-10-CM | POA: Diagnosis not present

## 2017-07-20 DIAGNOSIS — Z8249 Family history of ischemic heart disease and other diseases of the circulatory system: Secondary | ICD-10-CM | POA: Diagnosis not present

## 2017-07-20 DIAGNOSIS — N185 Chronic kidney disease, stage 5: Secondary | ICD-10-CM | POA: Diagnosis present

## 2017-07-20 DIAGNOSIS — I5032 Chronic diastolic (congestive) heart failure: Secondary | ICD-10-CM | POA: Diagnosis present

## 2017-07-20 DIAGNOSIS — R9439 Abnormal result of other cardiovascular function study: Secondary | ICD-10-CM | POA: Diagnosis not present

## 2017-07-20 DIAGNOSIS — R079 Chest pain, unspecified: Principal | ICD-10-CM

## 2017-07-20 DIAGNOSIS — E872 Acidosis: Secondary | ICD-10-CM | POA: Diagnosis present

## 2017-07-20 DIAGNOSIS — Z833 Family history of diabetes mellitus: Secondary | ICD-10-CM | POA: Diagnosis not present

## 2017-07-20 DIAGNOSIS — E119 Type 2 diabetes mellitus without complications: Secondary | ICD-10-CM | POA: Diagnosis not present

## 2017-07-20 DIAGNOSIS — I132 Hypertensive heart and chronic kidney disease with heart failure and with stage 5 chronic kidney disease, or end stage renal disease: Secondary | ICD-10-CM | POA: Diagnosis present

## 2017-07-20 LAB — CBC WITH DIFFERENTIAL/PLATELET
BASOS ABS: 0 10*3/uL (ref 0.0–0.1)
Basophils Relative: 0 %
Eosinophils Absolute: 0.2 10*3/uL (ref 0.0–0.7)
Eosinophils Relative: 4 %
HCT: 29.6 % — ABNORMAL LOW (ref 39.0–52.0)
HEMOGLOBIN: 9.8 g/dL — AB (ref 13.0–17.0)
Lymphocytes Relative: 26 %
Lymphs Abs: 1.2 10*3/uL (ref 0.7–4.0)
MCH: 31.5 pg (ref 26.0–34.0)
MCHC: 33.1 g/dL (ref 30.0–36.0)
MCV: 95.2 fL (ref 78.0–100.0)
MONOS PCT: 11 %
Monocytes Absolute: 0.5 10*3/uL (ref 0.1–1.0)
Neutro Abs: 2.8 10*3/uL (ref 1.7–7.7)
Neutrophils Relative %: 59 %
PLATELETS: 146 10*3/uL — AB (ref 150–400)
RBC: 3.11 MIL/uL — AB (ref 4.22–5.81)
RDW: 14.1 % (ref 11.5–15.5)
WBC: 4.7 10*3/uL (ref 4.0–10.5)

## 2017-07-20 LAB — I-STAT CHEM 8, ED
BUN: 39 mg/dL — AB (ref 6–20)
CALCIUM ION: 1.24 mmol/L (ref 1.15–1.40)
CREATININE: 5.5 mg/dL — AB (ref 0.61–1.24)
Chloride: 114 mmol/L — ABNORMAL HIGH (ref 101–111)
GLUCOSE: 105 mg/dL — AB (ref 65–99)
HCT: 30 % — ABNORMAL LOW (ref 39.0–52.0)
Hemoglobin: 10.2 g/dL — ABNORMAL LOW (ref 13.0–17.0)
Potassium: 5.3 mmol/L — ABNORMAL HIGH (ref 3.5–5.1)
Sodium: 140 mmol/L (ref 135–145)
TCO2: 18 mmol/L — ABNORMAL LOW (ref 22–32)

## 2017-07-20 LAB — BRAIN NATRIURETIC PEPTIDE: B Natriuretic Peptide: 101.9 pg/mL — ABNORMAL HIGH (ref 0.0–100.0)

## 2017-07-20 LAB — GLUCOSE, CAPILLARY: Glucose-Capillary: 85 mg/dL (ref 65–99)

## 2017-07-20 LAB — COMPREHENSIVE METABOLIC PANEL
ALK PHOS: 580 U/L — AB (ref 38–126)
ALT: 35 U/L (ref 17–63)
AST: 34 U/L (ref 15–41)
Albumin: 3.4 g/dL — ABNORMAL LOW (ref 3.5–5.0)
Anion gap: 5 (ref 5–15)
BUN: 46 mg/dL — AB (ref 6–20)
CALCIUM: 8.7 mg/dL — AB (ref 8.9–10.3)
CO2: 19 mmol/L — AB (ref 22–32)
Chloride: 114 mmol/L — ABNORMAL HIGH (ref 101–111)
Creatinine, Ser: 5.18 mg/dL — ABNORMAL HIGH (ref 0.61–1.24)
GFR calc Af Amer: 13 mL/min — ABNORMAL LOW (ref >60)
GFR calc non Af Amer: 12 mL/min — ABNORMAL LOW (ref >60)
Glucose, Bld: 111 mg/dL — ABNORMAL HIGH (ref 65–99)
Potassium: 5.3 mmol/L — ABNORMAL HIGH (ref 3.5–5.1)
Sodium: 138 mmol/L (ref 135–145)
Total Bilirubin: 0.3 mg/dL (ref 0.3–1.2)
Total Protein: 6.8 g/dL (ref 6.5–8.1)

## 2017-07-20 LAB — I-STAT TROPONIN, ED: TROPONIN I, POC: 0 ng/mL (ref 0.00–0.08)

## 2017-07-20 LAB — CBG MONITORING, ED: Glucose-Capillary: 88 mg/dL (ref 65–99)

## 2017-07-20 MED ORDER — ACETAMINOPHEN 650 MG RE SUPP: 650.0000 mg | Freq: Four times a day (QID) | RECTAL | Status: DC | PRN

## 2017-07-20 MED ORDER — HYDRALAZINE HCL 50 MG PO TABS
50.0000 mg | ORAL_TABLET | Freq: Three times a day (TID) | ORAL | Status: DC
  Administered 2017-07-20 – 2017-07-21 (×3): 50 mg via ORAL
  Filled 2017-07-20 (×3): qty 1

## 2017-07-20 MED ORDER — TRAVOPROST 0.004 % OP SOLN: 1.0000 [drp] | Freq: Every day | OPHTHALMIC | Status: DC

## 2017-07-20 MED ORDER — ONDANSETRON HCL 4 MG PO TABS
4.0000 mg | ORAL_TABLET | Freq: Four times a day (QID) | ORAL | Status: DC | PRN
  Administered 2017-07-22: 4 mg via ORAL
  Filled 2017-07-20: qty 1

## 2017-07-20 MED ORDER — ZOLPIDEM TARTRATE 5 MG PO TABS
5.0000 mg | ORAL_TABLET | Freq: Once | ORAL | Status: AC
  Administered 2017-07-20: 5 mg via ORAL
  Filled 2017-07-20: qty 1

## 2017-07-20 MED ORDER — POLYETHYLENE GLYCOL 3350 17 G PO PACK
17.0000 g | PACK | Freq: Every day | ORAL | Status: DC | PRN
  Filled 2017-07-20: qty 1

## 2017-07-20 MED ORDER — CARVEDILOL 25 MG PO TABS
25.0000 mg | ORAL_TABLET | Freq: Two times a day (BID) | ORAL | Status: DC
  Administered 2017-07-20 – 2017-07-22 (×4): 25 mg via ORAL
  Filled 2017-07-20 (×4): qty 1

## 2017-07-20 MED ORDER — ACETAMINOPHEN 325 MG PO TABS
650.0000 mg | ORAL_TABLET | Freq: Four times a day (QID) | ORAL | Status: DC | PRN
  Administered 2017-07-21: 650 mg via ORAL
  Filled 2017-07-20: qty 2

## 2017-07-20 MED ORDER — CLONIDINE HCL 0.1 MG PO TABS
0.1000 mg | ORAL_TABLET | Freq: Three times a day (TID) | ORAL | Status: DC
  Administered 2017-07-20 – 2017-07-22 (×5): 0.1 mg via ORAL
  Filled 2017-07-20 (×5): qty 1

## 2017-07-20 MED ORDER — POLYVINYL ALCOHOL 1.4 % OP SOLN
1.0000 [drp] | Freq: Three times a day (TID) | OPHTHALMIC | Status: DC | PRN
  Filled 2017-07-20: qty 15

## 2017-07-20 MED ORDER — NITROGLYCERIN 0.4 MG SL SUBL
0.4000 mg | SUBLINGUAL_TABLET | SUBLINGUAL | Status: DC | PRN
  Filled 2017-07-20: qty 1

## 2017-07-20 MED ORDER — OXYCODONE-ACETAMINOPHEN 5-325 MG PO TABS
1.0000 | ORAL_TABLET | Freq: Four times a day (QID) | ORAL | Status: DC | PRN
  Administered 2017-07-20: 1 via ORAL
  Filled 2017-07-20: qty 1

## 2017-07-20 MED ORDER — INSULIN ASPART 100 UNIT/ML ~~LOC~~ SOLN
0.0000 [IU] | Freq: Three times a day (TID) | SUBCUTANEOUS | Status: DC
  Administered 2017-07-21: 1 [IU] via SUBCUTANEOUS

## 2017-07-20 MED ORDER — ASPIRIN EC 81 MG PO TBEC
81.0000 mg | DELAYED_RELEASE_TABLET | Freq: Every day | ORAL | Status: DC
  Administered 2017-07-21 – 2017-07-22 (×2): 81 mg via ORAL
  Filled 2017-07-20 (×2): qty 1

## 2017-07-20 MED ORDER — ATORVASTATIN CALCIUM 80 MG PO TABS
80.0000 mg | ORAL_TABLET | Freq: Every day | ORAL | Status: DC
  Administered 2017-07-20 – 2017-07-21 (×2): 80 mg via ORAL
  Filled 2017-07-20: qty 2
  Filled 2017-07-20 (×3): qty 1

## 2017-07-20 MED ORDER — HYPROMELLOSE (GONIOSCOPIC) 2.5 % OP SOLN: 1.0000 [drp] | Freq: Three times a day (TID) | OPHTHALMIC | Status: DC | PRN

## 2017-07-20 MED ORDER — OXYCODONE HCL 5 MG PO TABS
5.0000 mg | ORAL_TABLET | Freq: Four times a day (QID) | ORAL | Status: DC | PRN
  Administered 2017-07-20: 5 mg via ORAL
  Filled 2017-07-20: qty 1

## 2017-07-20 MED ORDER — SODIUM CHLORIDE 0.9 % IV SOLN: 1.0000 g | Freq: Once | INTRAVENOUS | Status: DC

## 2017-07-20 MED ORDER — OXYCODONE HCL 5 MG PO TABS: 5.0000 mg | ORAL_TABLET | Freq: Four times a day (QID) | ORAL | Status: DC | PRN

## 2017-07-20 MED ORDER — SODIUM BICARBONATE 8.4 % IV SOLN: 50.0000 meq | Freq: Once | INTRAVENOUS | Status: DC

## 2017-07-20 MED ORDER — OXYCODONE-ACETAMINOPHEN 10-325 MG PO TABS: 1.0000 | ORAL_TABLET | Freq: Four times a day (QID) | ORAL | Status: DC | PRN

## 2017-07-20 MED ORDER — HYDROMORPHONE HCL 1 MG/ML IJ SOLN
1.0000 mg | Freq: Once | INTRAMUSCULAR | Status: AC
  Administered 2017-07-20: 1 mg via INTRAVENOUS
  Filled 2017-07-20: qty 1

## 2017-07-20 MED ORDER — HEPARIN SODIUM (PORCINE) 5000 UNIT/ML IJ SOLN
5000.0000 [IU] | Freq: Three times a day (TID) | INTRAMUSCULAR | Status: DC
  Administered 2017-07-20 – 2017-07-22 (×4): 5000 [IU] via SUBCUTANEOUS
  Filled 2017-07-20 (×4): qty 1

## 2017-07-20 MED ORDER — LATANOPROST 0.005 % OP SOLN
1.0000 [drp] | Freq: Every day | OPHTHALMIC | Status: DC
Start: 2017-07-20 — End: 2017-07-22
  Administered 2017-07-20 – 2017-07-21 (×3): 1 [drp] via OPHTHALMIC
  Filled 2017-07-20: qty 2.5

## 2017-07-20 MED ORDER — OXYCODONE-ACETAMINOPHEN 5-325 MG PO TABS: 1.0000 | ORAL_TABLET | ORAL | Status: DC | PRN

## 2017-07-20 MED ORDER — ONDANSETRON HCL 4 MG/2ML IJ SOLN
4.0000 mg | Freq: Four times a day (QID) | INTRAMUSCULAR | Status: DC | PRN
  Administered 2017-07-21: 4 mg via INTRAVENOUS
  Filled 2017-07-20: qty 2

## 2017-07-20 MED ORDER — ASPIRIN 325 MG PO TABS
325.0000 mg | ORAL_TABLET | Freq: Once | ORAL | Status: AC
  Administered 2017-07-20: 325 mg via ORAL
  Filled 2017-07-20: qty 1

## 2017-07-20 MED ORDER — OXYCODONE-ACETAMINOPHEN 5-325 MG PO TABS
1.0000 | ORAL_TABLET | ORAL | Status: DC | PRN
  Administered 2017-07-21 – 2017-07-22 (×5): 1 via ORAL
  Filled 2017-07-20 (×5): qty 1

## 2017-07-20 MED ORDER — GABAPENTIN 300 MG PO CAPS
300.0000 mg | ORAL_CAPSULE | Freq: Two times a day (BID) | ORAL | Status: DC
  Administered 2017-07-20 – 2017-07-22 (×4): 300 mg via ORAL
  Filled 2017-07-20 (×4): qty 1

## 2017-07-20 MED ORDER — OXYCODONE HCL 5 MG PO TABS
5.0000 mg | ORAL_TABLET | ORAL | Status: DC | PRN
  Administered 2017-07-21 – 2017-07-22 (×5): 5 mg via ORAL
  Filled 2017-07-20 (×5): qty 1

## 2017-07-20 NOTE — H&P (Signed)
History and Physical    Cody Webster CXK:[id number] DOB: [birth year] DOA: 07/20/2017  PCP: Cody Perna, NP   Patient coming from: Home  Chief Complaint: Chest pain  HPI: Cody Webster is a 52 y.o. male with medical history significant of type 1 diabetes complicated by peripheral neuropathy, stage V chronic kidney disease status post AV fistula in left upper extremity placed in August 2018, hypertension, hyperlipidemia, glaucoma who presents with chest pain.  Patient is an extremely poor historian and cannot provide any significant details for this chest pain.  He reports that he began to have chest pain that was center, and slightly to the left approximately 1 week ago.  The pain would last only for several minutes and then resolve on its own.  He could not describe the quality of the pain however called the midway point between squeezing and sharp.  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  He did not have any syncope or presyncope during this time however he did had some shortness of breath.  He did not have any diaphoresis.  He has not had any worsening lower extremity edema, orthopnea, paroxysmal nocturnal dyspnea, abdominal pain, palpitations, syncope, fevers, cough, congestion.  He reports his blood sugars have been extremely well controlled at his last A1c was 5.4.  Impressively for a type I diabetic he apparently has been weaned from insulin and is now merely on oral hypoglycemics.  ED Course: On admission his vital signs were stable.  His labs are notable for a creatinine of 5.18 which is around his baseline.  His potassium was 5.3 and he was mildly acidotic.  His alkaline phosphatase was 580.  His CBC showed an anemia of 9.8 which appeared to be stable for him.  His EKG showed J-point elevations in the precordial leads.  Cardiology was consulted and recommended inpatient admission for stress which, if positive, would  necessitate transfer to [facility] for cardiac catheterization as contrast dye went to patient over into requiring dialysis.  Troponin was negative x1  Review of Systems: As per HPI otherwise 10 point review of systems negative.    Past Medical History:  Diagnosis Date  . Carotid artery occlusion   . CHF (congestive heart failure) ([place])   . 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 Diabetes mellitus without complication ([place])    type 2  . GERD (gastroesophageal reflux disease)   . Hypertension   . Peripheral neuropathy   . Pneumonia   . Seizures (Cody Webster)    27 years ago    Past Surgical History:  Procedure Laterality Date  . AV FISTULA PLACEMENT Left 11/08/2016   Procedure: ARTERIOVENOUS (AV) FISTULA CREATION;  Surgeon: Angelia Mould, MD;  Location: [facility] OR;  Service: Vascular;  Laterality: Left;  . right foot surgery    . TOE AMPUTATION       reports that he has been smoking cigarettes and cigars.  He has a 30.00 pack-year smoking history. He quit smokeless tobacco use about 33 years ago. His smokeless tobacco use included snuff and chew. He reports that he drinks alcohol. He reports that he does not use drugs.  Allergies  Allergen Reactions  . Influenza Vaccines     Chest congestion, fever  . Morphine And Related Shortness Of Breath and Other (See Comments)    Hallucination  Tolerates Norco/Vicodin  . Pneumococcal Vaccines Nausea And Vomiting  FEVER    Family History  Problem Relation Age of Onset  . CAD Mother   . Diabetes type II Father   . Hypertension Sister   . CAD Sister   . Diabetes type II Brother     Prior to Admission medications   Medication Sig Start Date End Date Taking? Authorizing Provider  acetaminophen (TYLENOL) 500 MG tablet Take 1,000 mg by mouth every 6 (six) hours as needed (for pain/headaches.).    [provider]  aspirin EC 81 MG tablet Take 81 mg by mouth daily.    [provider]  carvedilol (COREG) 25  MG tablet Take 25 mg by mouth 2 (two) times daily. Reported on 05/27/2015 08/12/14   [provider]  cephALEXin (KEFLEX) 500 MG capsule Take 1 capsule (500 mg total) by mouth 2 (two) times daily. 12/22/16   Elam Dutch, MD  cloNIDine (CATAPRES) 0.1 MG tablet Take 0.1 mg by mouth 3 (three) times daily.    [provider]  gabapentin (NEURONTIN) 300 MG capsule Take 1 capsule (300 mg total) by mouth 2 (two) times daily. 05/10/17   Cody Minion, MD  hydrALAZINE (APRESOLINE) 50 MG tablet Take 50 mg by mouth 3 (three) times daily.    [provider]  hydroxypropyl methylcellulose / hypromellose (ISOPTO TEARS / GONIOVISC) 2.5 % ophthalmic solution Place 1 drop into both eyes 3 (three) times daily as needed for dry eyes.    [provider]  oxyCODONE-acetaminophen (PERCOCET) 10-325 MG tablet Take 1 tablet by mouth every 6 (six) hours as needed. For pain. 11/08/16   Rhyne, Hulen Shouts, PA-C  prednisoLONE acetate (PRED FORTE) 1 % ophthalmic suspension Place 1 drop into the right eye 2 (two) times daily.    [provider]  sitaGLIPtin (JANUVIA) 50 MG tablet Take 50 mg by mouth daily.    [provider]  travoprost, benzalkonium, (TRAVATAN) 0.004 % ophthalmic solution Place 1 drop into the left eye daily.    [provider]    Physical Exam: Vitals:   07/20/17 1040 07/20/17 1042 07/20/17 1136  BP: (!) 104/59  (!) 120/58  Pulse: 68  65  Resp: 15  14  Temp: 98.4 F (36.9 C)    TempSrc: Oral    SpO2: 100%  100%  Weight:  92.6 kg (204 lb 3 oz)   Height:  6\' 1"  (1.854 m)     Constitutional: NAD, calm, comfortable Vitals:   07/20/17 1040 07/20/17 1042 07/20/17 1136  BP: (!) 104/59  (!) 120/58  Pulse: 68  65  Resp: 15  14  Temp: 98.4 F (36.9 C)    TempSrc: Oral    SpO2: 100%  100%  Weight:  92.6 kg (204 lb 3 oz)   Height:  6\' 1"  (1.854 m)    Eyes: Anicteric sclera ENMT: Dry mucous membranes poor dentition Neck: normal, supple,  no appreciable JVD Respiratory: clear to auscultation bilaterally, no wheezing, no crackles. Normal respiratory effort. No accessory muscle use.  Cardiovascular: Regular rate and rhythm, distant heart sounds, no murmurs Abdomen: no tenderness, no masses palpated. No hepatosplenomegaly. Bowel sounds positive.  Musculoskeletal: No lower extremity edema Skin: Left AV fistula with audible bruit, palpable thrill Neurologic: Grossly intact, moving all extremities Psychiatric: Normal judgment and insight. Alert and oriented x 3. Normal mood.    Labs on Admission: I have personally reviewed following labs and imaging studies  CBC: Recent Labs  Lab 07/20/17 1109 07/20/17 1117  WBC 4.7  --  NEUTROABS 2.8  --   HGB 9.8* 10.2*  HCT 29.6* 30.0*  MCV 95.2  --   PLT 146*  --    Basic Metabolic Panel: Recent Labs  Lab 07/20/17 1109 07/20/17 1117  NA 138 140  K 5.3* 5.3*  CL 114* 114*  CO2 19*  --   GLUCOSE 111* 105*  BUN 46* 39*  CREATININE 5.18* 5.50*  CALCIUM 8.7*  --    GFR: Estimated Creatinine Clearance: 17.6 mL/min (A) (by C-G formula based on SCr of 5.5 mg/dL (H)). Liver Function Tests: Recent Labs  Lab 07/20/17 1109  AST 34  ALT 35  ALKPHOS 580*  BILITOT 0.3  PROT 6.8  ALBUMIN 3.4*   No results for input(s): LIPASE, AMYLASE in the last 168 hours. No results for input(s): AMMONIA in the last 168 hours. Coagulation Profile: No results for input(s): INR, PROTIME in the last 168 hours. Cardiac Enzymes: No results for input(s): CKTOTAL, CKMB, CKMBINDEX, TROPONINI in the last 168 hours. BNP (last 3 results) No results for input(s): PROBNP in the last 8760 hours. HbA1C: No results for input(s): HGBA1C in the last 72 hours. CBG: No results for input(s): GLUCAP in the last 168 hours. Lipid Profile: No results for input(s): CHOL, HDL, LDLCALC, TRIG, CHOLHDL, LDLDIRECT in the last 72 hours. Thyroid Function Tests: No results for input(s): TSH, T4TOTAL, FREET4, T3FREE,  THYROIDAB in the last 72 hours. Anemia Panel: No results for input(s): VITAMINB12, FOLATE, FERRITIN, TIBC, IRON, RETICCTPCT in the last 72 hours. Urine analysis:    Component Value Date/Time   COLORURINE YELLOW 04/23/2016 2336   APPEARANCEUR CLEAR 04/23/2016 2336   LABSPEC 1.012 04/23/2016 2336   PHURINE 5.0 04/23/2016 2336   GLUCOSEU 50 (A) 04/23/2016 2336   HGBUR SMALL (A) 04/23/2016 2336   BILIRUBINUR NEGATIVE 04/23/2016 2336   KETONESUR NEGATIVE 04/23/2016 2336   PROTEINUR >=300 (A) 04/23/2016 2336   UROBILINOGEN 0.2 01/11/2015 1740   NITRITE NEGATIVE 04/23/2016 2336   LEUKOCYTESUR NEGATIVE 04/23/2016 2336    Radiological Exams on Admission: Dg Chest 2 View  Result Date: 07/20/2017 CLINICAL DATA:  Chest pain. EXAM: CHEST - 2 VIEW COMPARISON:  Radiographs of April 25, 2016. FINDINGS: The heart size and mediastinal contours are within normal limits. Both lungs are clear. No pneumothorax or pleural effusion is noted. The visualized skeletal structures are unremarkable. IMPRESSION: No active cardiopulmonary disease. Electronically Signed   By: Marijo Conception, M.D.   On: 07/20/2017 10:55    EKG: Independently reviewed.  Sinus, J-point elevation in precordial leads  Assessment/Plan Principal Problem:   Chest pain Active Problems:   Essential hypertension   Peripheral neuropathy (HCC)   CKD (chronic kidney disease)   CKD (chronic kidney disease), stage IV (HCC)   #) Chest pain: Based on long history of uncontrolled diabetes, hypertension, chronic kidney disease patient is a high risk for having fairly significant coronary artery disease.  He is a very poor historian however the symptoms could very well reflect angina. -Cardiology following appreciate recommendations  -stress tomorrow -Continue aspirin - Order lipid panel, will hold on ordering statin at this time and defer to cardiology as it is unclear benefit to patients with such profound chronic kidney disease  #)  Hypertension: -Continue clonidine 0.1 mg 3 times daily, continue carvedilol 25 mg twice daily -Continue hydralazine 50 mg 3 times daily  #) Type 1 diabetes: At this time it is not clear the patient has truly type 1 diabetes.  He is apparently not on insulin  his A1c is only 5.4.  Suspect he has likely had L ADA. -Hold sitagliptin -Sliding scale insulin  #) Peripheral neuropathy: -Continue gabapentin 300 mg twice a day  Fluids: Tolerating p.o. Electrolytes: Monitor and supplement Nutrition: Fluid restricted, renal diet, n.p.o. at midnight for stress  Prophylaxis: Heparin  Disposition: Pending results of stress  DO NOT RESUSCITATE       Cristy Folks MD [facility]  If 7PM-7AM, please contact night-coverage [email or web address] Password [email or web address]  07/20/2017, 1:49 PM

## 2017-07-20 NOTE — ED Triage Notes (Signed)
Patient c/o chest pain that has been intermittent over past couple days. Reports that his back pain has been constant and on going.

## 2017-07-20 NOTE — ED Notes (Signed)
Nuclear med called and states patient needs to be transported to [facility] nuclear med at 8 am tomorrow morning.

## 2017-07-20 NOTE — Consult Note (Addendum)
Cardiology Consultation:   Patient ID: Cody Webster; [id number]; [birth year]   Admit date: 07/20/2017 Date of Consult: 07/20/2017  Primary Care Provider: Kerin Perna, NP Primary Cardiologist: Glenetta Hew, MD  Primary Electrophysiologist:     Patient Profile:   Cody Webster is a 52 y.o. male with a hx of poorly controlled DM and HTN with CKD stage IV/V, tobacco use, diabetic foot ulcer (left), chronic diastolic heart failure with previously normal EF (2016), and left carotid stenosis who is being seen today for the evaluation of chest pain at the request of Dr. Darl Householder.  History of Present Illness:   [name] was seen by Dr. Ellyn Hack in clinic on 11/04/16 as a new patient seeking preoperative clearance for placement of fistula for likely progression to HD. Dr. Ellyn Hack noted that he had risk factors for ACS (smoking, HTN, DM, family history), but with lack of symptoms and risk of progression to ESRD with a heart cath, he cleared him for surgery without further ischemic evaluation. Echocardiogram 2016 with preserved EF and possible WMA, so ischemic evaluation in the future is not unreasonable. He proceeded with the left AV fistula 12/3149 without complications.    He presented to [facility] with chest pain and back pain.  He states that he has been having intermittent chest pain for the past 3-4 months.  He has also been having shortness of breath that started approximately 5 days ago. The chest/back pain this morning was more intense that usual, rated 10/10 prompting him to seek medical help. This bout of chest pain occurred at approximately 5AM this morning. He reports chest pain every 2-3 weeks while at rest.  He is not active at home and generally stays in bed because of his diabetic foot ulcer and chronic back pain. Yesterday, while washing the dishes, he became short of breath and had chest pain, stating that he "over-did it." This chest pressure is located in his left chest and does not radiate,  although he can't tell because of arthritis and chronic pain in his arm. The chest pressure is not clearly associated with rest or activity.   His mother died of a heart attack at age 78. His father had "a bad heart" but died of DM complications.  Initial troponin negative, EKG without signs of acute ischemia. Hyperkalemic at 5.3 with worsening renal function.   Past Medical History:  Diagnosis Date  . Carotid artery occlusion   . CHF (congestive heart failure) ([place])   . 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 Diabetes mellitus without complication ([place])    type 2  . GERD (gastroesophageal reflux disease)   . Hypertension   . Peripheral neuropathy   . Pneumonia   . Seizures ([place])    27 years ago    Past Surgical History:  Procedure Laterality Date  . AV FISTULA PLACEMENT Left 11/08/2016   Procedure: ARTERIOVENOUS (AV) FISTULA CREATION;  Surgeon: Angelia Mould, MD;  Location: [facility] OR;  Service: Vascular;  Laterality: Left;  . right foot surgery    . TOE AMPUTATION       Home Medications:  Prior to Admission medications   Medication Sig Start Date End Date Taking? Authorizing Provider  acetaminophen (TYLENOL) 500 MG tablet Take 1,000 mg by mouth every 6 (six) hours as needed (for pain/headaches.).    [provider]  aspirin EC 81 MG tablet Take 81 mg by mouth daily.    [provider]  carvedilol (COREG) 25 MG tablet  Take 25 mg by mouth 2 (two) times daily. Reported on 05/27/2015 08/12/14   [provider]  cephALEXin (KEFLEX) 500 MG capsule Take 1 capsule (500 mg total) by mouth 2 (two) times daily. 12/22/16   Elam Dutch, MD  cloNIDine (CATAPRES) 0.1 MG tablet Take 0.1 mg by mouth 3 (three) times daily.    [provider]  gabapentin (NEURONTIN) 300 MG capsule Take 1 capsule (300 mg total) by mouth 2 (two) times daily. 05/10/17   Newt Minion, MD  hydrALAZINE (APRESOLINE) 50 MG tablet Take 50 mg by mouth 3 (three) times daily.     [provider]  hydroxypropyl methylcellulose / hypromellose (ISOPTO TEARS / GONIOVISC) 2.5 % ophthalmic solution Place 1 drop into both eyes 3 (three) times daily as needed for dry eyes.    [provider]  oxyCODONE-acetaminophen (PERCOCET) 10-325 MG tablet Take 1 tablet by mouth every 6 (six) hours as needed. For pain. 11/08/16   Rhyne, Hulen Shouts, PA-C  prednisoLONE acetate (PRED FORTE) 1 % ophthalmic suspension Place 1 drop into the right eye 2 (two) times daily.    [provider]  sitaGLIPtin (JANUVIA) 50 MG tablet Take 50 mg by mouth daily.    [provider]  travoprost, benzalkonium, (TRAVATAN) 0.004 % ophthalmic solution Place 1 drop into the left eye daily.    [provider]    Inpatient Medications: Scheduled Meds: . aspirin  325 mg Oral Once  .  HYDROmorphone (DILAUDID) injection  1 mg Intravenous Once   Continuous Infusions:  PRN Meds: nitroGLYCERIN  Allergies:    Allergies  Allergen Reactions  . Influenza Vaccines     Chest congestion, fever  . Morphine And Related Shortness Of Breath and Other (See Comments)    Hallucination  Tolerates Norco/Vicodin  . Pneumococcal Vaccines Nausea And Vomiting    FEVER    Social History:   Social History   Socioeconomic History  . Marital status: Single    Spouse name: Not on file  . Number of children: 1  . Years of education: Not on file  . Highest education level: Not on file  Social Needs  . Financial resource strain: Not on file  . Food insecurity - worry: Not on file  . Food insecurity - inability: Not on file  . Transportation needs - medical: Not on file  . Transportation needs - non-medical: Not on file  Occupational History    Comment: Disabled - for Back pain & foot ulcer  Tobacco Use  . Smoking status: Current Every Day Smoker    Packs/day: 1.00    Years: 30.00    Pack years: 30.00    Types: Cigarettes, Cigars  . Smokeless tobacco: Former Systems developer    Types:  Snuff, Sarina Ser    Quit date: 12/11/1983  Substance and Sexual Activity  . Alcohol use: Yes    Comment: ocassaionlly   . Drug use: No  . Sexual activity: Not Currently  Other Topics Concern  . Not on file  Social History Narrative  . Not on file    Family History:    Family History  Problem Relation Age of Onset  . CAD Mother   . Diabetes type II Father   . Hypertension Sister   . CAD Sister   . Diabetes type II Brother      ROS:  Please see the history of present illness.   All other ROS reviewed and negative.     Physical Exam/Data:  Vitals:   07/20/17 1040 07/20/17 1042 07/20/17 1136  BP: (!) 104/59  (!) 120/58  Pulse: 68  65  Resp: 15  14  Temp: 98.4 F (36.9 C)    TempSrc: Oral    SpO2: 100%  100%  Weight:  204 lb 3 oz (92.6 kg)   Height:  6\' 1"  (1.854 m)    No intake or output data in the 24 hours ending 07/20/17 1211 Filed Weights   07/20/17 1042  Weight: 204 lb 3 oz (92.6 kg)   Body mass index is 26.94 kg/m.  General:  Well nourished, well developed, in no acute distress HEENT: normal Neck: + JVD Endocrine:  No thryomegaly Vascular: No carotid bruits Cardiac:  normal S1, S2; RRR; no murmur Lungs:  clear to auscultation bilaterally, no wheezing, rhonchi or rales Abd: soft, nontender, no hepatomegaly  Ext: no edema Skin: warm and dry  Neuro:  CNs 2-12 intact, no focal abnormalities noted Psych:  Normal affect   EKG:  The EKG was personally reviewed and demonstrates:  sinus with repolarization abnormality, similar to prior Telemetry:  Telemetry was personally reviewed and demonstrates:  sinus  Relevant CV Studies:  Echo 07/12/14: Study Conclusions - Left ventricle: LVEF is approximately 50% with inferior hypokinesis. The cavity size was mildly dilated. Wall thickness was increased in a pattern of mild LVH. Doppler parameters are consistent with abnormal left ventricular relaxation (grade 1 diastolic dysfunction). - Left atrium: The  atrium was mildly dilated. - Right atrium: The atrium was mildly dilated.  Laboratory Data:  Chemistry Recent Labs  Lab 07/20/17 1109 07/20/17 1117  NA 138 140  K 5.3* 5.3*  CL 114* 114*  CO2 19*  --   GLUCOSE 111* 105*  BUN 46* 39*  CREATININE 5.18* 5.50*  CALCIUM 8.7*  --   GFRNONAA 12*  --   GFRAA 13*  --   ANIONGAP 5  --     Recent Labs  Lab 07/20/17 1109  PROT 6.8  ALBUMIN 3.4*  AST 34  ALT 35  ALKPHOS 580*  BILITOT 0.3   Hematology Recent Labs  Lab 07/20/17 1109 07/20/17 1117  WBC 4.7  --   RBC 3.11*  --   HGB 9.8* 10.2*  HCT 29.6* 30.0*  MCV 95.2  --   MCH 31.5  --   MCHC 33.1  --   RDW 14.1  --   PLT 146*  --    Cardiac EnzymesNo results for input(s): TROPONINI in the last 168 hours.  Recent Labs  Lab 07/20/17 1115  TROPIPOC 0.00    BNP Recent Labs  Lab 07/20/17 1109  BNP 101.9*    DDimer No results for input(s): DDIMER in the last 168 hours.  Radiology/Studies:  Dg Chest 2 View  Result Date: 07/20/2017 CLINICAL DATA:  Chest pain. EXAM: CHEST - 2 VIEW COMPARISON:  Radiographs of April 25, 2016. FINDINGS: The heart size and mediastinal contours are within normal limits. Both lungs are clear. No pneumothorax or pleural effusion is noted. The visualized skeletal structures are unremarkable. IMPRESSION: No active cardiopulmonary disease. Electronically Signed   By: Marijo Conception, M.D.   On: 07/20/2017 10:55    Assessment and Plan:   1. Chest pain, shortness of breath - initial troponin negative - EKG with repolarization abnormality, unchanged from prior This patient has several ACS risk factors and likely has coronary artery disease, given his HTN, DM, smoking, carotid stenosis, and family history of heart disease. His symptoms are concerning for  unstable angina. However, I had a long discussion about his kidney function and the likelihood of HD following a heart catheterization. The patient states that he will refuse HD, but will "think  about it."  Will continue trending troponin overnight. If it remains negative, will not pursue ischemic workup at this time.  If his troponin turns positive, start heparin drip and we will discuss heart catheterization. Given his shortness of breath, will repeat an echo. Will order stress test for tomorrow. NPO at MN.  2. HTN Home regimen includes coreg, clonidine, hydralazine. His pressures have been controlled so far. Continue this regimen. Imdur may be a good choice for possible unstable angina.   3. DM Per primary team  4. CKD stage IV-V Per nephrology. Echo will help guide medication selection and further ischemic evaluation.     For questions or updates, please contact [place] Please consult [email or web address] for contact info under Cardiology/STEMI.   Signed, Tami Lin Tianna Baus, PA  07/20/2017 12:11 PM

## 2017-07-20 NOTE — ED Notes (Addendum)
ED Provider at bedside. YAO AT BEDSIDE. PT EXPRESS DNR. THIS WRITER WITNESS THIS CONVERSATION. ALL OPTIONS DISCUSSED. PT VERBALIZED UNDERSTANDING. PT CONTINUES WITH DNR.   DNR

## 2017-07-20 NOTE — ED Notes (Signed)
CHARGE MAT Q RN HAS BEEN TAKING CARE OF THIS PT. THIS WRITER HAS BEEN TAKING CARE OF OTHER CRITICAL PT. UPON ARRIVAL TO ROOM PT IS TALKING ON CELL PHONE WITH FAMILY MEMBER. PT IS DISCUSSING WITH "EMERGENCY CONTACT PERSON ([place])" ON CELL PHONE.

## 2017-07-20 NOTE — ED Notes (Signed)
ED TO INPATIENT HANDOFF REPORT  Name/Age/Gender Cody Webster 52 y.o. male  Code Status    Code Status Orders  (From admission, onward)        Start     Ordered   07/20/17 1347  Do not attempt resuscitation (DNR)  Continuous    Question Answer Comment  In the event of cardiac or respiratory ARREST Do not call a "code blue"   In the event of cardiac or respiratory ARREST Do not perform Intubation, CPR, defibrillation or ACLS   In the event of cardiac or respiratory ARREST Use medication by any route, position, wound care, and other measures to relive pain and suffering. May use oxygen, suction and manual treatment of airway obstruction as needed for comfort.      07/20/17 1346    Code Status History    Date Active Date Inactive Code Status Order ID Comments User Context   04/23/2016 19:31 04/27/2016 19:33 Full Code [id number]  Hosie Poisson, MD Inpatient   01/11/2015 17:51 01/12/2015 18:57 Full Code [id number]  Annita Brod, MD ED   09/25/2014 15:47 09/27/2014 14:52 Full Code [id number]  Phillips Grout, MD Inpatient   09/06/2014 03:40 09/09/2014 17:50 Full Code [id number]  Theressa Millard, MD Inpatient   07/11/2014 22:00 07/12/2014 18:31 DNR [id number]  Toy Baker, MD ED   12/10/2013 20:59 12/13/2013 14:05 DNR [id number]  Quintella Baton, MD Inpatient      Home/SNF/Other Home  Chief Complaint chest and back pain  Level of Care/Admitting Diagnosis ED Disposition    ED Disposition Condition [facility] Area: [facility] [[id number]]  Level of Care: Telemetry [5]  Admit to tele based on following criteria: Monitor for Ischemic changes  Diagnosis: Chest pain [606301]  Admitting Physician: Cristy Folks [[id number]]  Attending Physician: Cristy Folks [[id number]]  Estimated length of stay: 3 - 4 days  Certification:: I certify this patient will need inpatient services for at least 2 midnights  PT Class (Do Not Modify): Inpatient [101]  PT  Acc Code (Do Not Modify): Private [1]       Medical History Past Medical History:  Diagnosis Date  . Carotid artery occlusion   . CHF (congestive heart failure) ([place])   . 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 Diabetes mellitus without complication ([place])    type 2  . GERD (gastroesophageal reflux disease)   . Hypertension   . Peripheral neuropathy   . Pneumonia   . Seizures ([place])    27 years ago    Allergies Allergies  Allergen Reactions  . Influenza Vaccines     Chest congestion, fever  . Morphine And Related Shortness Of Breath and Other (See Comments)    Hallucination  Tolerates Norco/Vicodin  . Pneumococcal Vaccines Nausea And Vomiting    FEVER    IV Location/Drains/Wounds Patient Lines/Drains/Airways Status   Active Line/Drains/Airways    Name:   Placement date:   Placement time:   Site:   Days:   Peripheral IV 07/20/17 Right Arm   07/20/17    1050    Arm   less than 1   Fistula / Graft Left Upper arm Arteriovenous fistula   11/08/16    1115    Upper arm   254   Incision (Closed) 11/08/16 Arm Left   11/08/16    0849     254   Wound / Incision (Open or Dehisced) 01/11/15 Diabetic ulcer Foot Left;Posterior;Lateral diabetic  ulcer dry and crusty  black open wound non draining   01/11/15    2130    Foot   921   Wound / Incision (Open or Dehisced) 04/24/16 Diabetic ulcer Foot Left diabetic ulcer to sole of left foot   04/24/16    1122    Foot   452          Labs/Imaging Results for orders placed or performed during the hospital encounter of 07/20/17 (from the past 48 hour(s))  CBC with Differential/Platelet     Status: Abnormal   Collection Time: 07/20/17 11:09 AM  Result Value Ref Range   WBC 4.7 4.0 - 10.5 K/uL   RBC 3.11 (L) 4.22 - 5.81 MIL/uL   Hemoglobin 9.8 (L) 13.0 - 17.0 g/dL   HCT 29.6 (L) 39.0 - 52.0 %   MCV 95.2 78.0 - 100.0 fL   MCH 31.5 26.0 - 34.0 pg   MCHC 33.1 30.0 - 36.0 g/dL   RDW 14.1 11.5 - 15.5 %   Platelets 146 (L) 150 - 400 K/uL    Neutrophils Relative % 59 %   Neutro Abs 2.8 1.7 - 7.7 K/uL   Lymphocytes Relative 26 %   Lymphs Abs 1.2 0.7 - 4.0 K/uL   Monocytes Relative 11 %   Monocytes Absolute 0.5 0.1 - 1.0 K/uL   Eosinophils Relative 4 %   Eosinophils Absolute 0.2 0.0 - 0.7 K/uL   Basophils Relative 0 %   Basophils Absolute 0.0 0.0 - 0.1 K/uL    Comment: Performed at [facility], [place] [address], [place], [place] [address]  Comprehensive metabolic panel     Status: Abnormal   Collection Time: 07/20/17 11:09 AM  Result Value Ref Range   Sodium 138 135 - 145 mmol/L   Potassium 5.3 (H) 3.5 - 5.1 mmol/L   Chloride 114 (H) 101 - 111 mmol/L   CO2 19 (L) 22 - 32 mmol/L   Glucose, Bld 111 (H) 65 - 99 mg/dL   BUN 46 (H) 6 - 20 mg/dL   Creatinine, Ser 5.18 (H) 0.61 - 1.24 mg/dL   Calcium 8.7 (L) 8.9 - 10.3 mg/dL   Total Protein 6.8 6.5 - 8.1 g/dL   Albumin 3.4 (L) 3.5 - 5.0 g/dL   AST 34 15 - 41 U/L   ALT 35 17 - 63 U/L   Alkaline Phosphatase 580 (H) 38 - 126 U/L   Total Bilirubin 0.3 0.3 - 1.2 mg/dL   GFR calc non Af Amer 12 (L) >60 mL/min   GFR calc Af Amer 13 (L) >60 mL/min    Comment: (NOTE) The eGFR has been calculated using the CKD EPI equation. This calculation has not been validated in all clinical situations. eGFR's persistently <60 mL/min signify possible Chronic Kidney Disease.    Anion gap 5 5 - 15    Comment: Performed at [facility], [place] [address] [place], [place] [address]  Brain natriuretic peptide     Status: Abnormal   Collection Time: 07/20/17 11:09 AM  Result Value Ref Range   B Natriuretic Peptide 101.9 (H) 0.0 - 100.0 pg/mL    Comment: Performed at [facility], [place] [address], [place], [place] [address]  I-stat troponin, ED     Status: None   Collection Time: 07/20/17 11:15 AM  Result Value Ref Range   Troponin i, poc 0.00 0.00 - 0.08 ng/mL   Comment 3  Comment: Due to the release kinetics of cTnI, a  negative result within the first hours of the onset of symptoms does not rule out myocardial infarction with certainty. If myocardial infarction is still suspected, repeat the test at appropriate intervals.   I-stat chem 8, ed     Status: Abnormal   Collection Time: 07/20/17 11:17 AM  Result Value Ref Range   Sodium 140 135 - 145 mmol/L   Potassium 5.3 (H) 3.5 - 5.1 mmol/L   Chloride 114 (H) 101 - 111 mmol/L   BUN 39 (H) 6 - 20 mg/dL   Creatinine, Ser 5.50 (H) 0.61 - 1.24 mg/dL   Glucose, Bld 105 (H) 65 - 99 mg/dL   Calcium, Ion 1.24 1.15 - 1.40 mmol/L   TCO2 18 (L) 22 - 32 mmol/L   Hemoglobin 10.2 (L) 13.0 - 17.0 g/dL   HCT 30.0 (L) 39.0 - 52.0 %   Dg Chest 2 View  Result Date: 07/20/2017 CLINICAL DATA:  Chest pain. EXAM: CHEST - 2 VIEW COMPARISON:  Radiographs of April 25, 2016. FINDINGS: The heart size and mediastinal contours are within normal limits. Both lungs are clear. No pneumothorax or pleural effusion is noted. The visualized skeletal structures are unremarkable. IMPRESSION: No active cardiopulmonary disease. Electronically Signed   By: Marijo Conception, M.D.   On: 07/20/2017 10:55    Pending Labs Unresulted Labs (From admission, onward)   Start     Ordered   07/21/17 0500  Lipid panel  Tomorrow morning,   R     07/20/17 1347   Signed and Held  HIV antibody (Routine Testing)  Once,   R     Signed and Held   Signed and Occupational hygienist morning,   R     Signed and Held   Signed and Held  CBC  Tomorrow morning,   R     Signed and Held   Signed and Held  Hemoglobin A1c  Once,   R    Comments:  To assess prior glycemic control    Signed and Held   Signed and Held  Lipid panel  Tomorrow morning,   R     Signed and Held      Vitals/Pain Today's Vitals   07/20/17 1042 07/20/17 1136 07/20/17 1559 07/20/17 1624  BP:  (!) 120/58  (!) 180/88  Pulse:  65 71 68  Resp:  '14 18 14  ' Temp:      TempSrc:      SpO2:  100% 100% 99%  Weight: 204 lb 3 oz  (92.6 kg)     Height: '6\' 1"'  (1.854 m)       Isolation Precautions No active isolations  Medications Medications  nitroGLYCERIN (NITROSTAT) SL tablet 0.4 mg (not administered)  atorvastatin (LIPITOR) tablet 80 mg (not administered)  HYDROmorphone (DILAUDID) injection 1 mg (1 mg Intravenous Given 07/20/17 1227)  aspirin tablet 325 mg (325 mg Oral Given 07/20/17 1226)    Mobility walks

## 2017-07-20 NOTE — ED Provider Notes (Signed)
[facility] DEPT Provider Note   CSN: [id number] Arrival date & time: 07/20/17  1030     History   Chief Complaint Chief Complaint  Patient presents with  . Chest Pain  . Back Pain    HPI Jamareon Shimel is a 52 y.o. male history of diabetes, pneumonia, CHF here presenting with chest pain, back pain.  Patient does have chronic back pain that is getting worse.  For the last 2 days, he has substernal chest pain that radiated to his back and jaw.  Patient states that the pain is constant and associated with some nausea.  Patient states that he has a AV fistula but is not on dialysis currently.  States that he was diagnosed with heart failure before but has no cardiac stents.  Currently has 10 out of 10 pain and no meds prior to arrival.  The history is provided by the patient.    Past Medical History:  Diagnosis Date  . Carotid artery occlusion   . CHF (congestive heart failure) ([place])   . 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 Diabetes mellitus without complication ([place])    type 2  . GERD (gastroesophageal reflux disease)   . Hypertension   . Peripheral neuropathy   . Pneumonia   . Seizures ([place])    27 years ago    Patient Active Problem List   Diagnosis Date Noted  . Preop cardiovascular exam 11/04/2016  . Streptococcal pneumonia ([place]) 04/27/2016  . CAP (community acquired pneumonia) 04/23/2016  . Malnutrition of moderate degree ([place]) 01/12/2015  . Chronic diastolic heart failure ([place]) 01/11/2015  . Toxic metabolic encephalopathy 35/59/7416  . AKI (acute kidney injury) ([place]) 01/11/2015  . DM type 2, uncontrolled, with neuropathy ([place]) 01/11/2015  . Hyperosmolar non-ketotic state in patient with type 2 diabetes mellitus ([place]) 01/11/2015  . Hyperglycemic hyperosmolar nonketotic coma ([place]) 01/11/2015  . Non-ketotic hyperglycinemia, type II ([place]) 01/11/2015  . Glaucoma   . Hypertensive urgency 09/25/2014  . Eye pain   . Hypoglycemia  09/06/2014  . Acute glaucoma of right eye 09/06/2014  . Acute-on-chronic kidney injury ([place]) 09/06/2014  . Anemia 09/06/2014  . Tobacco use disorder 09/06/2014  . Frequent headaches 07/11/2014  . Hyperglycemia 07/11/2014  . DM type 2 causing CKD stage 2 ([place]) 07/11/2014  . CKD (chronic kidney disease), stage IV ([place]) 07/11/2014  . Hyperglycemia due to type 2 diabetes mellitus ([place]) 07/11/2014  . Ataxia 07/11/2014  . CKD (chronic kidney disease) 12/11/2013  . Diabetic foot ulcer ([place]) 12/10/2013  . Essential hypertension 12/10/2013  . Peripheral neuropathy ([place]) 12/10/2013    Past Surgical History:  Procedure Laterality Date  . AV FISTULA PLACEMENT Left 11/08/2016   Procedure: ARTERIOVENOUS (AV) FISTULA CREATION;  Surgeon: Angelia Mould, MD;  [facility];  Service: Vascular;  Laterality: Left;  . right foot surgery    . TOE AMPUTATION         Home Medications    Prior to Admission medications   Medication Sig Start Date End Date Taking? Authorizing Provider  acetaminophen (TYLENOL) 500 MG tablet Take 1,000 mg by mouth every 6 (six) hours as needed (for pain/headaches.).    [provider]  aspirin EC 81 MG tablet Take 81 mg by mouth daily.    [provider]  carvedilol (COREG) 25 MG tablet Take 25 mg by mouth 2 (two) times daily. Reported on 05/27/2015 08/12/14   [provider]  cephALEXin (KEFLEX) 500 MG  capsule Take 1 capsule (500 mg total) by mouth 2 (two) times daily. 12/22/16   Elam Dutch, MD  cloNIDine (CATAPRES) 0.1 MG tablet Take 0.1 mg by mouth 3 (three) times daily.    [provider]  gabapentin (NEURONTIN) 300 MG capsule Take 1 capsule (300 mg total) by mouth 2 (two) times daily. 05/10/17   Newt Minion, MD  hydrALAZINE (APRESOLINE) 50 MG tablet Take 50 mg by mouth 3 (three) times daily.    [provider]  hydroxypropyl methylcellulose / hypromellose (ISOPTO TEARS / GONIOVISC) 2.5 % ophthalmic solution  Place 1 drop into both eyes 3 (three) times daily as needed for dry eyes.    [provider]  oxyCODONE-acetaminophen (PERCOCET) 10-325 MG tablet Take 1 tablet by mouth every 6 (six) hours as needed. For pain. 11/08/16   Rhyne, Hulen Shouts, PA-C  prednisoLONE acetate (PRED FORTE) 1 % ophthalmic suspension Place 1 drop into the right eye 2 (two) times daily.    [provider]  sitaGLIPtin (JANUVIA) 50 MG tablet Take 50 mg by mouth daily.    [provider]  travoprost, benzalkonium, (TRAVATAN) 0.004 % ophthalmic solution Place 1 drop into the left eye daily.    [provider]    Family History Family History  Problem Relation Age of Onset  . CAD Mother   . Diabetes type II Father   . Hypertension Sister   . CAD Sister   . Diabetes type II Brother     Social History Social History   Tobacco Use  . Smoking status: Current Every Day Smoker    Packs/day: 1.00    Years: 30.00    Pack years: 30.00    Types: Cigarettes, Cigars  . Smokeless tobacco: Former User    Types: Snuff, Sarina Ser    Quit date: 12/11/1983  Substance Use Topics  . Alcohol use: Yes    Comment: ocassaionlly   . Drug use: No     Allergies   Influenza vaccines; Morphine and related; and Pneumococcal vaccines   Review of Systems Review of Systems  Cardiovascular: Positive for chest pain.  Musculoskeletal: Positive for back pain.  All other systems reviewed and are negative.    Physical Exam Updated Vital Signs BP (!) 120/58 (BP Location: Right Arm)   Pulse 65   Temp 98.4 F (36.9 C) (Oral)   Resp 14   Ht 6\' 1"  (1.854 m)   Wt 92.6 kg (204 lb 3 oz)   SpO2 100%   BMI 26.94 kg/m   Physical Exam  Constitutional:  Uncomfortable   HENT:  Head: Normocephalic.  Eyes: Pupils are equal, round, and reactive to light.  Neck: Normal range of motion.  Cardiovascular: Normal rate, regular rhythm and normal pulses.  Pulmonary/Chest: Effort normal.  Diminished bilateral bases    Abdominal: Soft. Bowel sounds are normal.  Musculoskeletal: Normal range of motion.       Right lower leg: Normal.       Left lower leg: Normal.  Neurological: He is alert.  Skin: Skin is warm. Capillary refill takes less than 2 seconds.  Psychiatric: He has a normal mood and affect. His behavior is normal.  Nursing note and vitals reviewed.    ED Treatments / Results  Labs (all labs ordered are listed, but only abnormal results are displayed) Labs Reviewed  CBC WITH DIFFERENTIAL/PLATELET - Abnormal; Notable for the following components:      Result Value   RBC 3.11 (*)  Hemoglobin 9.8 (*)    HCT 29.6 (*)    Platelets 146 (*)    All other components within normal limits  COMPREHENSIVE METABOLIC PANEL - Abnormal; Notable for the following components:   Potassium 5.3 (*)    Chloride 114 (*)    CO2 19 (*)    Glucose, Bld 111 (*)    BUN 46 (*)    Creatinine, Ser 5.18 (*)    Calcium 8.7 (*)    Albumin 3.4 (*)    Alkaline Phosphatase 580 (*)    GFR calc non Af Amer 12 (*)    GFR calc Af Amer 13 (*)    All other components within normal limits  BRAIN NATRIURETIC PEPTIDE - Abnormal; Notable for the following components:   B Natriuretic Peptide 101.9 (*)    All other components within normal limits  I-STAT CHEM 8, ED - Abnormal; Notable for the following components:   Potassium 5.3 (*)    Chloride 114 (*)    BUN 39 (*)    Creatinine, Ser 5.50 (*)    Glucose, Bld 105 (*)    TCO2 18 (*)    Hemoglobin 10.2 (*)    HCT 30.0 (*)    All other components within normal limits  I-STAT TROPONIN, ED  I-STAT TROPONIN, ED    EKG  EKG Interpretation  Date/Time:  Thursday July 20 2017 10:55:25 EST Ventricular Rate:  67 PR Interval:    QRS Duration: 86 QT Interval:  402 QTC Calculation: 425 R Axis:   66 Text Interpretation:  Sinus rhythm Borderline T abnormalities, lateral leads ST elev, probable normal early repol pattern No significant change since last tracing earlier in  the day  Confirmed by Wandra Arthurs [phone]) on 07/20/2017 11:26:50 AM Also confirmed by Wandra Arthurs [phone]), editor Philomena Doheny [phone])  on 07/20/2017 12:23:48 PM       Radiology Dg Chest 2 View  Result Date: 07/20/2017 CLINICAL DATA:  Chest pain. EXAM: CHEST - 2 VIEW COMPARISON:  Radiographs of April 25, 2016. FINDINGS: The heart size and mediastinal contours are within normal limits. Both lungs are clear. No pneumothorax or pleural effusion is noted. The visualized skeletal structures are unremarkable. IMPRESSION: No active cardiopulmonary disease. Electronically Signed   By: Marijo Conception, M.D.   On: 07/20/2017 10:55    Procedures Procedures (including critical care time)  Angiocath insertion Performed by: Wandra Arthurs  Consent: Verbal consent obtained. Risks and benefits: risks, benefits and alternatives were discussed Time out: Immediately prior to procedure a "time out" was called to verify the correct patient, procedure, equipment, support staff and site/side marked as required.  Preparation: Patient was prepped and draped in the usual sterile fashion.  Vein Location: R antecube  Ultrasound Guided  Gauge: 20 long   Normal blood return and flush without difficulty Patient tolerance: Patient tolerated the procedure well with no immediate complications.     Medications Ordered in ED Medications  nitroGLYCERIN (NITROSTAT) SL tablet 0.4 mg (not administered)  HYDROmorphone (DILAUDID) injection 1 mg (1 mg Intravenous Given 07/20/17 1227)  aspirin tablet 325 mg (325 mg Oral Given 07/20/17 1226)     Initial Impression / Assessment and Plan / ED Course  I have reviewed the triage vital signs and the nursing notes.  Pertinent labs & imaging results that were available during my care of the patient were reviewed by me and considered in my medical decision making (see chart for details).    [place] Shi  is a 52 y.o. male here with chest pain, back pain, neck pain. Initial  EKG showed J point vs STEMI that is different than previous. Still has pain on exam. There is no obvious reciprocal changes. Will call STEMI doctor. Will get labs, CXR, trop.   1:01 PM Talked to Dr. Martinique from cardiology. He doesn't want to activate code STEMI and thinks likely J point elevation. Dr. Oval Linsey saw patient and thinks EKG unchanged from previous. K 5.3. I suspect that changes can be from hyperkalemia. I talked to Dr. Melvia Heaps from nephrology, who doesn't want calcium or bicarb, just observe for now. If labs worsen then can call nephrology for formal consult. Patient is resistant to starting dialysis currently. Hospitalist to admit for renal failure, chest pain r/o ACS.    Final Clinical Impressions(s) / ED Diagnoses   Final diagnoses:  None    ED Discharge Orders    None       Drenda Freeze, MD 07/20/17 1303

## 2017-07-20 NOTE — Progress Notes (Signed)
[facility] transport set up to pick patient up at 730am to be at [facility] by 8am.

## 2017-07-20 NOTE — ED Notes (Signed)
ADMITTING MD PUROSHIT MADE AWARE OF ELEVATED BP. NO NEW ORDERS GIVEN AT THIS TIME. WOULD LIKE NOT TO GIVE IV MEDICATIONS FOR THIS PT AS THIS MAY DROP HIS BP TOO MUCH. [facility] MED[facility]. CAN TRANSFER TO FLOOR.  INFORMED MD PUROSHIT THAT PT HAD BACK PAIN HOWEVER THIS PT WAS CHRONIC FROM TIME IN THE SERVICE. PT CURRENTLY EATING DINNER. PT AWARE OF ASSIGNED FLLOR.  KATIE RN AWARE OF EVENTS.

## 2017-07-21 ENCOUNTER — Inpatient Hospital Stay (HOSPITAL_COMMUNITY): Payer: Medicaid Other

## 2017-07-21 ENCOUNTER — Ambulatory Visit (HOSPITAL_COMMUNITY)
Admit: 2017-07-21 | Discharge: 2017-07-21 | Disposition: A | Discharge status: Home / Self Care | Payer: Medicaid Other | Attending: Physician Assistant | Admitting: Physician Assistant

## 2017-07-21 DIAGNOSIS — E119 Type 2 diabetes mellitus without complications: Secondary | ICD-10-CM

## 2017-07-21 DIAGNOSIS — R079 Chest pain, unspecified: Secondary | ICD-10-CM

## 2017-07-21 DIAGNOSIS — I10 Essential (primary) hypertension: Secondary | ICD-10-CM

## 2017-07-21 DIAGNOSIS — N184 Chronic kidney disease, stage 4 (severe): Secondary | ICD-10-CM

## 2017-07-21 DIAGNOSIS — N189 Chronic kidney disease, unspecified: Secondary | ICD-10-CM

## 2017-07-21 DIAGNOSIS — N179 Acute kidney failure, unspecified: Secondary | ICD-10-CM

## 2017-07-21 DIAGNOSIS — E875 Hyperkalemia: Secondary | ICD-10-CM

## 2017-07-21 DIAGNOSIS — R0600 Dyspnea, unspecified: Secondary | ICD-10-CM

## 2017-07-21 LAB — BASIC METABOLIC PANEL
ANION GAP: 6 (ref 5–15)
Anion gap: 5 (ref 5–15)
BUN: 47 mg/dL — ABNORMAL HIGH (ref 6–20)
CO2: 21 mmol/L — ABNORMAL LOW (ref 22–32)
Calcium: 8.8 mg/dL — ABNORMAL LOW (ref 8.9–10.3)
Calcium: 9.1 mg/dL (ref 8.9–10.3)
Chloride: 114 mmol/L — ABNORMAL HIGH (ref 101–111)
Chloride: 112 mmol/L — ABNORMAL HIGH (ref 101–111)
Creatinine, Ser: 4.93 mg/dL — ABNORMAL HIGH (ref 0.61–1.24)
Creatinine, Ser: 4.79 mg/dL — ABNORMAL HIGH (ref 0.61–1.24)
GFR calc Af Amer: 14 mL/min — ABNORMAL LOW (ref >60)
GFR calc Af Amer: 15 mL/min — ABNORMAL LOW (ref >60)
GFR calc non Af Amer: 12 mL/min — ABNORMAL LOW (ref >60)
GFR calc non Af Amer: 13 mL/min — ABNORMAL LOW (ref >60)
GLUCOSE: 125 mg/dL — AB (ref 65–99)
POTASSIUM: 5.5 mmol/L — AB (ref 3.5–5.1)
Sodium: 140 mmol/L (ref 135–145)
Sodium: 139 mmol/L (ref 135–145)

## 2017-07-21 LAB — CBC
HCT: 31.4 % — ABNORMAL LOW (ref 39.0–52.0)
Hemoglobin: 10.1 g/dL — ABNORMAL LOW (ref 13.0–17.0)
MCH: 30.9 pg (ref 26.0–34.0)
MCHC: 32.2 g/dL (ref 30.0–36.0)
MCV: 96 fL (ref 78.0–100.0)
Platelets: 166 10*3/uL (ref 150–400)
RBC: 3.27 MIL/uL — ABNORMAL LOW (ref 4.22–5.81)
RDW: 13.8 % (ref 11.5–15.5)
WBC: 4.4 10*3/uL (ref 4.0–10.5)

## 2017-07-21 LAB — NM MYOCAR MULTI W/SPECT W/WALL MOTION / EF
CHL CUP MPHR: 167 {beats}/min
CHL CUP RESTING HR STRESS: 74 {beats}/min
Estimated workload: 1 METS
Exercise duration (min): 5 min
Peak HR: 86 {beats}/min
Percent HR: 51 %

## 2017-07-21 LAB — LIPID PANEL
Cholesterol: 230 mg/dL — ABNORMAL HIGH (ref 0–200)
Cholesterol: 247 mg/dL — ABNORMAL HIGH (ref 0–200)
HDL: 64 mg/dL (ref >40)
HDL: 64 mg/dL (ref >40)
LDL Cholesterol: 131 mg/dL — ABNORMAL HIGH (ref 0–99)
LDL Cholesterol: 147 mg/dL — ABNORMAL HIGH (ref 0–99)
Total CHOL/HDL Ratio: 3.6 RATIO
Total CHOL/HDL Ratio: 3.9 RATIO
Triglycerides: 177 mg/dL — ABNORMAL HIGH (ref <150)
Triglycerides: 182 mg/dL — ABNORMAL HIGH (ref <150)
VLDL: 35 mg/dL (ref 0–40)
VLDL: 36 mg/dL (ref 0–40)

## 2017-07-21 LAB — BASIC METABOLIC PANEL WITH GFR
BUN: 47 mg/dL — ABNORMAL HIGH (ref 6–20)
CO2: 21 mmol/L — ABNORMAL LOW (ref 22–32)
Glucose, Bld: 89 mg/dL (ref 65–99)
Potassium: 5.5 mmol/L — ABNORMAL HIGH (ref 3.5–5.1)

## 2017-07-21 LAB — ECHOCARDIOGRAM COMPLETE
HEIGHTINCHES: 73 in
Weight: 3233.6 oz

## 2017-07-21 LAB — TROPONIN I
TROPONIN I: 0.03 ng/mL — AB (ref <0.03)
Troponin I: <0.03 ng/mL (ref <0.03)

## 2017-07-21 LAB — GLUCOSE, CAPILLARY
Glucose-Capillary: 100 mg/dL — ABNORMAL HIGH (ref 65–99)
Glucose-Capillary: 110 mg/dL — ABNORMAL HIGH (ref 65–99)
Glucose-Capillary: 126 mg/dL — ABNORMAL HIGH (ref 65–99)
Glucose-Capillary: 130 mg/dL — ABNORMAL HIGH (ref 65–99)

## 2017-07-21 LAB — HIV ANTIBODY (ROUTINE TESTING W REFLEX): HIV Screen 4th Generation wRfx: NONREACTIVE

## 2017-07-21 MED ORDER — SODIUM POLYSTYRENE SULFONATE 15 GM/60ML PO SUSP
30.0000 g | Freq: Once | ORAL | Status: AC
  Administered 2017-07-21: 30 g via ORAL
  Filled 2017-07-21: qty 120

## 2017-07-21 MED ORDER — CYCLOBENZAPRINE HCL 5 MG PO TABS
5.0000 mg | ORAL_TABLET | Freq: Three times a day (TID) | ORAL | Status: DC | PRN
  Administered 2017-07-21: 5 mg via ORAL
  Filled 2017-07-21: qty 1

## 2017-07-21 MED ORDER — LIDOCAINE 5 % EX PTCH
1.0000 | MEDICATED_PATCH | CUTANEOUS | Status: DC
  Administered 2017-07-21: 1 via TRANSDERMAL
  Filled 2017-07-21 (×2): qty 1

## 2017-07-21 MED ORDER — REGADENOSON 0.4 MG/5ML IV SOLN
INTRAVENOUS | Status: AC
  Filled 2017-07-21: qty 5

## 2017-07-21 MED ORDER — TECHNETIUM TC 99M TETROFOSMIN IV KIT
30.0000 | PACK | Freq: Once | INTRAVENOUS | Status: AC | PRN
  Administered 2017-07-21: 30 via INTRAVENOUS

## 2017-07-21 MED ORDER — REGADENOSON 0.4 MG/5ML IV SOLN
0.4000 mg | Freq: Once | INTRAVENOUS | Status: AC
  Administered 2017-07-21: 0.4 mg via INTRAVENOUS

## 2017-07-21 MED ORDER — HYDRALAZINE HCL 20 MG/ML IJ SOLN
10.0000 mg | Freq: Four times a day (QID) | INTRAMUSCULAR | Status: DC | PRN
  Administered 2017-07-21: 10 mg via INTRAVENOUS
  Filled 2017-07-21: qty 1

## 2017-07-21 MED ORDER — SODIUM BICARBONATE 8.4 % IV SOLN
50.0000 meq | Freq: Once | INTRAVENOUS | Status: AC
  Administered 2017-07-21: 50 meq via INTRAVENOUS
  Filled 2017-07-21: qty 50

## 2017-07-21 MED ORDER — POLYETHYLENE GLYCOL 3350 17 G PO PACK
17.0000 g | PACK | Freq: Every day | ORAL | Status: DC
  Administered 2017-07-21 – 2017-07-22 (×2): 17 g via ORAL
  Filled 2017-07-21: qty 1

## 2017-07-21 MED ORDER — HYDRALAZINE HCL 20 MG/ML IJ SOLN
10.0000 mg | Freq: Once | INTRAMUSCULAR | Status: AC
  Administered 2017-07-21: 10 mg via INTRAVENOUS
  Filled 2017-07-21: qty 1

## 2017-07-21 MED ORDER — SODIUM POLYSTYRENE SULFONATE 15 GM/60ML PO SUSP
30.0000 g | ORAL | Status: AC
  Administered 2017-07-21: 30 g via ORAL
  Filled 2017-07-21: qty 120

## 2017-07-21 MED ORDER — HYDRALAZINE HCL 50 MG PO TABS
75.0000 mg | ORAL_TABLET | Freq: Three times a day (TID) | ORAL | Status: DC
  Administered 2017-07-21: 21:00:00 75 mg via ORAL
  Filled 2017-07-21: qty 1

## 2017-07-21 MED ORDER — ISOSORBIDE MONONITRATE ER 30 MG PO TB24
30.0000 mg | ORAL_TABLET | Freq: Every day | ORAL | Status: DC
  Administered 2017-07-21: 30 mg via ORAL
  Filled 2017-07-21: qty 1

## 2017-07-21 MED ORDER — ALUM & MAG HYDROXIDE-SIMETH 200-200-20 MG/5ML PO SUSP
30.0000 mL | ORAL | Status: DC | PRN
  Administered 2017-07-21: 30 mL via ORAL
  Filled 2017-07-21: qty 30

## 2017-07-21 MED ORDER — HYDROMORPHONE HCL 1 MG/ML IJ SOLN
0.5000 mg | INTRAMUSCULAR | Status: AC | PRN
  Administered 2017-07-21 – 2017-07-22 (×3): 0.5 mg via INTRAVENOUS
  Filled 2017-07-21 (×3): qty 0.5

## 2017-07-21 MED ORDER — SENNOSIDES-DOCUSATE SODIUM 8.6-50 MG PO TABS
1.0000 | ORAL_TABLET | Freq: Two times a day (BID) | ORAL | Status: DC
  Administered 2017-07-21 – 2017-07-22 (×2): 1 via ORAL
  Filled 2017-07-21 (×2): qty 1

## 2017-07-21 MED ORDER — TECHNETIUM TC 99M TETROFOSMIN IV KIT
10.0000 | PACK | Freq: Once | INTRAVENOUS | Status: AC | PRN
  Administered 2017-07-21: 10 via INTRAVENOUS

## 2017-07-21 NOTE — Consult Note (Signed)
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  Nonhealing with callous present circumferentially,  Right fifth metatarsal amputation.  Fourth metatarsal has blister to lateral aspect.   Wound type:Neuropathic Patient complaining of pain to back. Rated as 10.  Has been given Percocet and OXY IR, he reports no relief.  Is seen at pain management for chronic pain management.  Pressure Injury POA: Yes Measurement: Left plantar foot 2 cm x 1 cm x 0.2 cm with calloused periphery 0.5 cm ruptured blister to right fourth metatarsal (fifth toe is surgically absent) Wound bed: ruddy red Drainage (amount, consistency, odor) minimal serosanguinous Periwound:callous to left plantar foot Dressing procedure/placement/frequency:  Cleanse left plantar foot and right toe blister with NS.  Apply small piece Aquacel AG to wound bed. COver with dry gauze and kerlix. Change Mon/Wed/Fri  Bedside RN to perform.  Will not follow at this time.  Please re-consult if needed.  Domenic Moras RN BSN [place] Pager [phone]

## 2017-07-21 NOTE — Progress Notes (Signed)
Progress Note  Patient Name: Cody Webster Date of Encounter: 07/21/2017  Primary Cardiologist: Glenetta Hew, MD   Subjective   Feels "funny">> Tired/sleepy. No chest pain or dyspnea. Tolerated NST ok. Results pending.   Inpatient Medications    Scheduled Meds: . aspirin EC  81 mg Oral Daily  . atorvastatin  80 mg Oral q1800  . carvedilol  25 mg Oral BID  . cloNIDine  0.1 mg Oral TID  . gabapentin  300 mg Oral BID  . heparin  5,000 Units Subcutaneous Q8H  . hydrALAZINE  50 mg Oral TID  . insulin aspart  0-9 Units Subcutaneous TID WC  . latanoprost  1 drop Left Eye QHS   Continuous Infusions:  PRN Meds: acetaminophen **OR** acetaminophen, nitroGLYCERIN, ondansetron **OR** ondansetron (ZOFRAN) IV, oxyCODONE-acetaminophen **AND** oxyCODONE, polyethylene glycol, polyvinyl alcohol   Vital Signs    Vitals:   07/21/17 0204 07/21/17 0415 07/21/17 0921 07/21/17 0924  BP: 133/70 (!) 144/78 (!) 204/90 (!) 175/84  Pulse: 71 70 75 75  Resp:  17    Temp:  98.4 F (36.9 C)    TempSrc:  Oral    SpO2:  99%    Weight:      Height:        Intake/Output Summary (Last 24 hours) at 07/21/2017 0935 Last data filed at 07/21/2017 0753 Gross per 24 hour  Intake 240 ml  Output 825 ml  Net -585 ml   Filed Weights   07/20/17 1042 07/20/17 1848  Weight: 204 lb 3 oz (92.6 kg) 202 lb 1.6 oz (91.7 kg)    Telemetry    NSR - Personally Reviewed  ECG    LVH with repolarization abnormalities - Personally Reviewed  Physical Exam   GEN: No acute distress.   Neck: No JVD Cardiac: RRR, no murmurs, rubs, or gallops.  Respiratory: Clear to auscultation bilaterally. GI: Soft, nontender, non-distended  MS: No edema; No deformity. Neuro:  Nonfocal  Psych: Normal affect   Labs    Chemistry Recent Labs  Lab 07/20/17 1109 07/20/17 1117 07/21/17 0543  NA 138 140 140  K 5.3* 5.3* 5.5*  CL 114* 114* 114*  CO2 19*  --  21*  GLUCOSE 111* 105* 89  BUN 46* 39* 47*  CREATININE 5.18*  5.50* 4.93*  CALCIUM 8.7*  --  8.8*  PROT 6.8  --   --   ALBUMIN 3.4*  --   --   AST 34  --   --   ALT 35  --   --   ALKPHOS 580*  --   --   BILITOT 0.3  --   --   GFRNONAA 12*  --  12*  GFRAA 13*  --  14*  ANIONGAP 5  --  5     Hematology Recent Labs  Lab 07/20/17 1109 07/20/17 1117 07/21/17 0543  WBC 4.7  --  4.4  RBC 3.11*  --  3.27*  HGB 9.8* 10.2* 10.1*  HCT 29.6* 30.0* 31.4*  MCV 95.2  --  96.0  MCH 31.5  --  30.9  MCHC 33.1  --  32.2  RDW 14.1  --  13.8  PLT 146*  --  166    Cardiac EnzymesNo results for input(s): TROPONINI in the last 168 hours.  Recent Labs  Lab 07/20/17 1115  TROPIPOC 0.00     BNP Recent Labs  Lab 07/20/17 1109  BNP 101.9*     DDimer No results for input(s): DDIMER in the  last 168 hours.   Radiology    Dg Chest 2 View  Result Date: 07/20/2017 CLINICAL DATA:  Chest pain. EXAM: CHEST - 2 VIEW COMPARISON:  Radiographs of April 25, 2016. FINDINGS: The heart size and mediastinal contours are within normal limits. Both lungs are clear. No pneumothorax or pleural effusion is noted. The visualized skeletal structures are unremarkable. IMPRESSION: No active cardiopulmonary disease. Electronically Signed   By: Marijo Conception, M.D.   On: 07/20/2017 10:55    Cardiac Studies   NST 07/21/17 - pending  2D Echo 07/21/17 - pending    Patient Profile   [name] is a 22M with diabetes, hypertension, CKD IV-V, carotid stenosis, diabetic foot ulcer, and tobacco abuse here with chest pain. His chest pain has both typical and atypical features.  EKG shows LVH with repolarization abnormalities.  POC troponins negative x 2.  Prior echo showed LVEF 50% with inferior hypokinesis. NST completed this morning. Results pending.   Assessment & Plan    1. Chest Pain: mixed typical and atypical features. POC troponins negative x 2. EKGs show LVH with repolarization abnormalities. Echo pending. NST has been completed. Interpretation pending. [facility]  radiology to read. Further recs to follow once stress test has been resulted.   2. HTN: elevated in nuclear med w/ SBPs in the 170s at start of test. Improved to the 492E-100F systolic after Lexiscan was given. Pt has been NPO and AM meds were held. He will get morning meds when he returns to his unit. Monitor throughout the day.   3. DM: per primary team.   4. CKD, Stage V: he has a AV fistula but is not on dialysis currently. GFR is 13 mL/min. He is hypokalemic at 5.5. ? Kayexalate. Management per IM and nephrology. He is getting closer to needing HD.   5. HLD: FLP showed elevated LDL at 147 mg/dL. TG 182. Total cholesterol 247. Lipitor 80 mg initiated yesterday. Recheck FLP and HFTs in 6-8 weeks. Given his cardiac risk factors, would still target LDL goal to < 70 mg/dL.      For questions or updates, please contact [place] Please consult [email or web address] for contact info under Cardiology/STEMI.      Signed, Lyda Jester, PA-C  07/21/2017, 9:35 AM

## 2017-07-21 NOTE — Progress Notes (Signed)
Nutrition Education Note  RD consulted for Renal Education. Provided Food Pyramid for Healthy Eating with Kidney Disease. Reviewed food groups and provided written recommended serving sizes specifically determined for patient's current nutritional status.   Explained why diet restrictions are needed and provided lists of foods to limit/avoid that are high potassium, sodium, and phosphorus. Provided specific recommendations on safer alternatives of these foods. Strongly encouraged compliance of this diet.   Patient reports he is going to eat "how he wants" to maintain his quality of life. Pt unwilling to make changes and his family "knows his wishes". Discussed which foods to limit and why. Left handout for family as patient cannot read with poor eye sight.Teach back method used.  Expect poor compliance.  Body mass index is 26.66 kg/m. Pt meets criteria for overweight based on current BMI.  Current diet order is Renal/carb modified. Labs and medications reviewed. No further nutrition interventions warranted at this time. RD contact information provided. If additional nutrition issues arise, please re-consult RD.  Mariana Single RD, LDN Clinical Nutrition Pager # [phone]

## 2017-07-21 NOTE — Progress Notes (Signed)
  Echocardiogram 2D Echocardiogram has been performed.  Merrie Roof F 07/21/2017, 12:29 PM

## 2017-07-21 NOTE — Progress Notes (Signed)
PROGRESS NOTE  Cody Webster AST:[id number] DOB: August 16, 1965 DOA: 07/20/2017 PCP: Kerin Perna, NP  HPI/Recap of past 24 hours:  Returned back from stress test Poor historian  bp elevated, c/o back pain, intermittent chest pain, bilateral shoulder pain  Two brothers at bedside  Assessment/Plan: Principal Problem:   Chest pain Active Problems:   Essential hypertension   Peripheral neuropathy (HCC)   CKD (chronic kidney disease)   CKD (chronic kidney disease), stage IV (HCC)  Chest pain With significant risk factor including diabetes hypertension CKD Troponin negative Cardiology consulted Stress test result pending We will follow cardiology recommendation   Hypertension Blood pressure not well controlled, continue home medication Coreg, clonidine, increase hydralazine, Start Imdur Stop lisinopril due to renal impairment and hyperkalemia   Non-insulin-dependent type 2 diabetes Report he was diagnosed when he was 25 Check A1c Hold Januvia in the setting of renal impairment Start on SSI  Chronic diabetic foot ulcer, left foot Does Not appear infected, wound care input appreciated  CKD 5 Status post AV fistula placement July 2018 He does not appear volume overloaded But he has hyperkalemia Case discussed with nephrology over the phone, if no improvement may need formal nephrology consult.  Hyperkalemia Hold ACE inhibitor, Kayexalate, sodium bicarb  Chronic low back pain,  no weakness, no bowel bladder incontinence Report oxycodone is not working, request Dilaudid   Code Status: DNR  Family Communication: patient and brothers  Disposition Plan: not ready to discharge.   Consultants:  Cardiology  Case discussed with nephrology Dr Jonnie Finner.  Procedures:  Stress test  Antibiotics:  none   Objective: BP (!) 199/87 (BP Location: Right Arm)   Pulse 72   Temp 98.1 F (36.7 C) (Oral)   Resp 17   Ht 6\' 1"  (1.854 m)   Wt 91.7 kg (202 lb 1.6  oz)   SpO2 100%   BMI 26.66 kg/m   Intake/Output Summary (Last 24 hours) at 07/21/2017 1222 Last data filed at 07/21/2017 0753 Gross per 24 hour  Intake 240 ml  Output 825 ml  Net -585 ml   Filed Weights   07/20/17 1042 07/20/17 1848  Weight: 92.6 kg (204 lb 3 oz) 91.7 kg (202 lb 1.6 oz)    Exam: Patient is examined daily including today on 07/21/2017, exams remain the same as of yesterday except that has changed    General:  NAD, right eye blind  Cardiovascular: RRR  Respiratory: CTABL  Abdomen: Soft/ND/NT, positive BS  Musculoskeletal: No Edema, left foot chronic ulcer, no drainage, no erythema, no odor  Neuro: alert, oriented   Data Reviewed: Basic Metabolic Panel: Recent Labs  Lab 07/20/17 1109 07/20/17 1117 07/21/17 0543  NA 138 140 140  K 5.3* 5.3* 5.5*  CL 114* 114* 114*  CO2 19*  --  21*  GLUCOSE 111* 105* 89  BUN 46* 39* 47*  CREATININE 5.18* 5.50* 4.93*  CALCIUM 8.7*  --  8.8*   Liver Function Tests: Recent Labs  Lab 07/20/17 1109  AST 34  ALT 35  ALKPHOS 580*  BILITOT 0.3  PROT 6.8  ALBUMIN 3.4*   No results for input(s): LIPASE, AMYLASE in the last 168 hours. No results for input(s): AMMONIA in the last 168 hours. CBC: Recent Labs  Lab 07/20/17 1109 07/20/17 1117 07/21/17 0543  WBC 4.7  --  4.4  NEUTROABS 2.8  --   --   HGB 9.8* 10.2* 10.1*  HCT 29.6* 30.0* 31.4*  MCV 95.2  --  96.0  PLT 146*  --  166   Cardiac Enzymes:   No results for input(s): CKTOTAL, CKMB, CKMBINDEX, TROPONINI in the last 168 hours. BNP (last 3 results) Recent Labs    07/20/17 1109  BNP 101.9*    ProBNP (last 3 results) No results for input(s): PROBNP in the last 8760 hours.  CBG: Recent Labs  Lab 07/20/17 1732 07/20/17 2043 07/21/17 0738 07/21/17 1154  GLUCAP 88 85 100* 110*    No results found for this or any previous visit (from the past 240 hour(s)).   Studies: No results found.  Scheduled Meds: . aspirin EC  81 mg Oral Daily  .  atorvastatin  80 mg Oral q1800  . carvedilol  25 mg Oral BID  . cloNIDine  0.1 mg Oral TID  . gabapentin  300 mg Oral BID  . heparin  5,000 Units Subcutaneous Q8H  . hydrALAZINE  50 mg Oral TID  . insulin aspart  0-9 Units Subcutaneous TID WC  . latanoprost  1 drop Left Eye QHS  . lidocaine  1 patch Transdermal Q24H  . sodium polystyrene  30 g Oral Once    Continuous Infusions:   Time spent: 68mins I have personally reviewed and interpreted on  07/21/2017 daily labs, tele strips, imagings as discussed above under date review session and assessment and plans.  I reviewed all nursing notes, pharmacy notes, consultant notes,  vitals, pertinent old records  I have discussed plan of care as described above with RN , patient and family on 07/21/2017   Florencia Reasons MD, PhD  [facility] Pager [phone]. If 7PM-7AM, please contact night-coverage at [email or web address], password [facility] 07/21/2017, 12:22 PM  LOS: 1 day

## 2017-07-21 NOTE — Progress Notes (Signed)
CRITICAL VALUE ALERT  Critical Value:  Troponin 0.03  Date & Time Notied:  07/21/2017 1827  Provider Notified: Dr. Erlinda Hong  Orders Received/Actions taken:

## 2017-07-21 NOTE — Progress Notes (Signed)
Reviewed myoview results with Dr. Oval Linsey. He has reversible ischemia in a small area that we do not think warrants a heart cath in the absence of anginal symptoms, especially if he is still unsure if he will proceed with dialysis. We will medically manage for now. If he develops chest pain or if he proceeds with HD anyway, we can reconsider heart cath at that time.   Tami Lin Mikhaela Zaugg, PA-C 07/21/2017, 4:47 PM [place]

## 2017-07-22 DIAGNOSIS — N185 Chronic kidney disease, stage 5: Secondary | ICD-10-CM

## 2017-07-22 DIAGNOSIS — N170 Acute kidney failure with tubular necrosis: Secondary | ICD-10-CM

## 2017-07-22 DIAGNOSIS — R9439 Abnormal result of other cardiovascular function study: Secondary | ICD-10-CM

## 2017-07-22 LAB — CBC WITH DIFFERENTIAL/PLATELET
Basophils Absolute: 0 10*3/uL (ref 0.0–0.1)
Basophils Relative: 0 %
Eosinophils Absolute: 0.1 10*3/uL (ref 0.0–0.7)
Eosinophils Relative: 3 %
HEMATOCRIT: 31.8 % — AB (ref 39.0–52.0)
Hemoglobin: 10.5 g/dL — ABNORMAL LOW (ref 13.0–17.0)
LYMPHS PCT: 19 %
Lymphs Abs: 0.9 10*3/uL (ref 0.7–4.0)
MCH: 31.3 pg (ref 26.0–34.0)
MCHC: 33 g/dL (ref 30.0–36.0)
MCV: 94.6 fL (ref 78.0–100.0)
MONO ABS: 0.4 10*3/uL (ref 0.1–1.0)
Monocytes Relative: 9 %
NEUTROS ABS: 3.3 10*3/uL (ref 1.7–7.7)
Neutrophils Relative %: 69 %
Platelets: 178 10*3/uL (ref 150–400)
RBC: 3.36 MIL/uL — ABNORMAL LOW (ref 4.22–5.81)
RDW: 13.8 % (ref 11.5–15.5)
WBC: 4.7 10*3/uL (ref 4.0–10.5)

## 2017-07-22 LAB — BASIC METABOLIC PANEL
Anion gap: 8 (ref 5–15)
BUN: 47 mg/dL — ABNORMAL HIGH (ref 6–20)
CO2: 23 mmol/L (ref 22–32)
CREATININE: 4.89 mg/dL — AB (ref 0.61–1.24)
Calcium: 8.9 mg/dL (ref 8.9–10.3)
Chloride: 110 mmol/L (ref 101–111)
GFR calc Af Amer: 14 mL/min — ABNORMAL LOW (ref >60)
GFR calc non Af Amer: 12 mL/min — ABNORMAL LOW (ref >60)
GLUCOSE: 131 mg/dL — AB (ref 65–99)
Potassium: 4.8 mmol/L (ref 3.5–5.1)
Sodium: 141 mmol/L (ref 135–145)

## 2017-07-22 LAB — TROPONIN I: Troponin I: 0.04 ng/mL (ref <0.03)

## 2017-07-22 LAB — GLUCOSE, CAPILLARY
Glucose-Capillary: 93 mg/dL (ref 65–99)
Glucose-Capillary: 167 mg/dL — ABNORMAL HIGH (ref 65–99)

## 2017-07-22 LAB — HEMOGLOBIN A1C
Hgb A1c MFr Bld: 5.1 % (ref 4.8–5.6)
Mean Plasma Glucose: 100 mg/dL

## 2017-07-22 LAB — CK: Total CK: 125 U/L (ref 49–397)

## 2017-07-22 MED ORDER — ATORVASTATIN CALCIUM 80 MG PO TABS: 80.0000 mg | ORAL_TABLET | Freq: Every day | ORAL | 0 refills | Status: DC

## 2017-07-22 MED ORDER — HYDRALAZINE HCL 50 MG PO TABS: 100.0000 mg | ORAL_TABLET | Freq: Three times a day (TID) | ORAL | 0 refills | Status: DC

## 2017-07-22 MED ORDER — SENNOSIDES-DOCUSATE SODIUM 8.6-50 MG PO TABS: 1.0000 | ORAL_TABLET | Freq: Every day | ORAL | 0 refills | Status: DC

## 2017-07-22 MED ORDER — ISOSORBIDE MONONITRATE ER 60 MG PO TB24
60.0000 mg | ORAL_TABLET | Freq: Every day | ORAL | Status: DC
  Administered 2017-07-22: 60 mg via ORAL
  Filled 2017-07-22: qty 1

## 2017-07-22 MED ORDER — CYCLOBENZAPRINE HCL 5 MG PO TABS: 5.0000 mg | ORAL_TABLET | Freq: Three times a day (TID) | ORAL | 0 refills | Status: DC | PRN

## 2017-07-22 MED ORDER — ISOSORBIDE MONONITRATE ER 60 MG PO TB24: 60.0000 mg | ORAL_TABLET | Freq: Every day | ORAL | 0 refills | Status: DC

## 2017-07-22 MED ORDER — NITROGLYCERIN 0.4 MG SL SUBL: 0.4000 mg | SUBLINGUAL_TABLET | SUBLINGUAL | 0 refills | Status: AC | PRN

## 2017-07-22 MED ORDER — SITAGLIPTIN PHOSPHATE 25 MG PO TABS: 25.0000 mg | ORAL_TABLET | Freq: Every day | ORAL | 0 refills | Status: DC

## 2017-07-22 MED ORDER — NITROGLYCERIN 0.4 MG SL SUBL: 0.4000 mg | SUBLINGUAL_TABLET | SUBLINGUAL | 0 refills | Status: DC | PRN

## 2017-07-22 MED ORDER — HYDRALAZINE HCL 50 MG PO TABS
100.0000 mg | ORAL_TABLET | Freq: Three times a day (TID) | ORAL | Status: DC
  Administered 2017-07-22: 100 mg via ORAL
  Filled 2017-07-22: qty 2

## 2017-07-22 NOTE — Progress Notes (Signed)
Progress Note  Patient Name: Cody Webster Date of Encounter: 07/22/2017  Primary Cardiologist:   Glenetta Hew, MD   Subjective   No further chest pain.  He complains of back pain.   Inpatient Medications    Scheduled Meds: . aspirin EC  81 mg Oral Daily  . atorvastatin  80 mg Oral q1800  . carvedilol  25 mg Oral BID  . cloNIDine  0.1 mg Oral TID  . gabapentin  300 mg Oral BID  . heparin  5,000 Units Subcutaneous Q8H  . hydrALAZINE  75 mg Oral TID  . insulin aspart  0-9 Units Subcutaneous TID WC  . isosorbide mononitrate  30 mg Oral Daily  . latanoprost  1 drop Left Eye QHS  . lidocaine  1 patch Transdermal Q24H  . polyethylene glycol  17 g Oral Daily  . senna-docusate  1 tablet Oral BID   Continuous Infusions:  PRN Meds: acetaminophen **OR** acetaminophen, alum & mag hydroxide-simeth, cyclobenzaprine, hydrALAZINE, nitroGLYCERIN, ondansetron **OR** ondansetron (ZOFRAN) IV, oxyCODONE-acetaminophen **AND** oxyCODONE, polyethylene glycol, polyvinyl alcohol   Vital Signs    Vitals:   07/21/17 1642 07/21/17 2046 07/21/17 2311 07/22/17 0311  BP: (!) 197/83 (!) 187/91 (!) 173/92 (!) 166/83  Pulse: 83 90 95 90  Resp:      Temp:  98.7 F (37.1 C)  99 F (37.2 C)  TempSrc:  Oral  Oral  SpO2: 99% 100%  99%  Weight:      Height:        Intake/Output Summary (Last 24 hours) at 07/22/2017 0749 Last data filed at 07/22/2017 0600 Gross per 24 hour  Intake 0 ml  Output 400 ml  Net -400 ml   Filed Weights   07/20/17 1042 07/20/17 1848  Weight: 204 lb 3 oz (92.6 kg) 202 lb 1.6 oz (91.7 kg)    Telemetry    NSR, PVCs - Personally Reviewed  ECG    NA - Personally Reviewed  Physical Exam   GEN: No acute distress.   Neck: No  JVD Cardiac: RRR,  no murmurs, rubs, or gallops.  Respiratory: Clear  to auscultation bilaterally. GI: Soft, nontender, non-distended  MS: No  edema; No deformity. Neuro:  Nonfocal  Psych: Normal affect   Labs    Chemistry Recent Labs    Lab 07/20/17 1109  07/21/17 0543 07/21/17 1635 07/22/17 0119  NA 138   < > 140 139 141  K 5.3*   < > 5.5* 5.5* 4.8  CL 114*   < > 114* 112* 110  CO2 19*  --  21* 21* 23  GLUCOSE 111*   < > 89 125* 131*  BUN 46*   < > 47* 47* 47*  CREATININE 5.18*   < > 4.93* 4.79* 4.89*  CALCIUM 8.7*  --  8.8* 9.1 8.9  PROT 6.8  --   --   --   --   ALBUMIN 3.4*  --   --   --   --   AST 34  --   --   --   --   ALT 35  --   --   --   --   ALKPHOS 580*  --   --   --   --   BILITOT 0.3  --   --   --   --   GFRNONAA 12*  --  12* 13* 12*  GFRAA 13*  --  14* 15* 14*  ANIONGAP 5  --  5 6  8   < > = values in this interval not displayed.     Hematology Recent Labs  Lab 07/20/17 1109 07/20/17 1117 07/21/17 0543 07/22/17 0119  WBC 4.7  --  4.4 4.7  RBC 3.11*  --  3.27* 3.36*  HGB 9.8* 10.2* 10.1* 10.5*  HCT 29.6* 30.0* 31.4* 31.8*  MCV 95.2  --  96.0 94.6  MCH 31.5  --  30.9 31.3  MCHC 33.1  --  32.2 33.0  RDW 14.1  --  13.8 13.8  PLT 146*  --  166 178    Cardiac Enzymes Recent Labs  Lab 07/21/17 1302 07/21/17 1630 07/22/17 0119  TROPONINI <0.03 0.03* 0.04*    Recent Labs  Lab 07/20/17 1115  TROPIPOC 0.00     BNP Recent Labs  Lab 07/20/17 1109  BNP 101.9*     DDimer No results for input(s): DDIMER in the last 168 hours.   Radiology    Dg Chest 2 View  Result Date: 07/20/2017 CLINICAL DATA:  Chest pain. EXAM: CHEST - 2 VIEW COMPARISON:  Radiographs of April 25, 2016. FINDINGS: The heart size and mediastinal contours are within normal limits. Both lungs are clear. No pneumothorax or pleural effusion is noted. The visualized skeletal structures are unremarkable. IMPRESSION: No active cardiopulmonary disease. Electronically Signed   By: Marijo Conception, M.D.   On: 07/20/2017 10:55   Nm Myocar Multi W/spect W/wall Motion / Ef  Result Date: 07/21/2017 CLINICAL DATA:  Chest pain, concern for ischemia EXAM: MYOCARDIAL IMAGING WITH SPECT (REST AND PHARMACOLOGIC-STRESS) GATED  LEFT VENTRICULAR WALL MOTION STUDY LEFT VENTRICULAR EJECTION FRACTION TECHNIQUE: Standard myocardial SPECT imaging was performed after resting intravenous injection of 10 mCi Tc-36m tetrofosmin. Subsequently, intravenous infusion of Lexiscan was performed under the supervision of the Cardiology staff. At peak effect of the drug, 30 mCi Tc-41m tetrofosmin was injected intravenously and standard myocardial SPECT imaging was performed. Quantitative gated imaging was also performed to evaluate left ventricular wall motion, and estimate left ventricular ejection fraction. COMPARISON:  None. FINDINGS: EKG: *ST segment depression was noted during stress in the II, V5 and V6 leads, and returning to baseline after less than 1 minute of recovery. Perfusion: There is a moderate size region of moderate decreased counts in the mid and basilar segment of the inferior wall which improves from stress imaging to rest imaging. Wall Motion: Mild LEFT ventricular dilatation. Mild septal hypokinesia. Left Ventricular Ejection Fraction: 47 % End diastolic volume 161 ml End systolic volume 90 ml IMPRESSION: 1. Moderate size region of moderate ischemia in the inferior basilar segment of the inferior wall. 2. Mild LEFT ventricular dilatation. 3. Left ventricular ejection fraction 47% 4. Non invasive risk stratification*: Intermediate to high *2012 Appropriate Use Criteria for Coronary Revascularization Focused Update: J Am Coll Cardiol. 0960;45(4):098-119. http://content.airportbarriers.com.aspx?articleid=[email or web address] These results will be called to the ordering clinician or representative by the Radiologist Assistant, and communication documented in the PACS or zVision Dashboard. Electronically Signed   By: Suzy Bouchard M.D.   On: 07/21/2017 14:05    Cardiac Studies   ECHO:  07/21/17 Study Conclusions  - Left ventricle: The cavity size was normal. Wall thickness was   increased in a pattern of moderate LVH. Systolic function was    normal. The estimated ejection fraction was in the range of 55%   to 60%. Wall motion was normal; there were no regional wall   motion abnormalities. Doppler parameters are consistent with   abnormal left ventricular relaxation (grade 1 diastolic  dysfunction). The E/e&' ratio is between 8-15, suggesting   indeterminate LV filling pressure. - Mitral valve: Mildly thickened leaflets . There was trivial   regurgitation. - Right ventricle: The cavity size was mildly dilated. - Right atrium: Moderately dilated. - Inferior vena cava: The vessel was normal in size. The   respirophasic diameter changes were in the normal range (>= 50%),   consistent with normal central venous pressure.  Lexiscan Myoview.  IMPRESSION: 1. Moderate size region of moderate ischemia in the inferior basilar segment of the inferior wall.  2. Mild LEFT ventricular dilatation.  3. Left ventricular ejection fraction 47%  4. Non invasive risk stratification*: Intermediate to high  Patient Profile     52 y.o. male with a hx of poorly controlled DM and HTN with CKD stage IV/V, tobacco use, diabetic foot ulcer (left), chronic diastolic heart failure with previously normal EF (2016), and left carotid stenosis who is being seen for the evaluation of chest pain at the request of Dr. Darl Householder.  Assessment & Plan    CHEST PAIN:    Plan per Dr. Oval Linsey and discussed with the patient is to manage conservatively.  This was not a high risk study on further review.  He would not commit to long term dialysis.  Continue medical management unless he has clear unstable symptoms.  HTN:   BP is elevated.  I increased hydralazine and Imdur.    DM:   A1C 5.1.  Continue current therapy.   CKD IV - V:   Per primary team.    RISK REDUCTION:  Started Lipitor.  Lipid panel is pending.    For questions or updates, please contact [place] Please consult [email or web address] for contact info under Cardiology/STEMI.   Signed, Minus Breeding, MD  07/22/2017, 7:49 AM

## 2017-07-22 NOTE — Progress Notes (Signed)
CRITICAL VALUE ALERT  Critical Value: troponin 0.04  Date & Time Notied:  07/22/17 0230   Provider Notified: Kennon Holter, NP   Orders Received/Actions taken: no new orders at this time

## 2017-07-22 NOTE — Care Management Note (Addendum)
3Case Management Note  Patient Details  Name: Cody Webster MRN: [id number] Date of Birth: [birth year]  Subjective/Objective:  Chest pain, CKD, HTN                  Action/Plan: NCM spoke to pt at bedside and niece, Cody Webster via phone # [phone]. Lives at home with brother, Cody Webster. Offered choice for HH/list provided. Niece requested Alvis Lemmings for [facility]. Contacted [facility] with new referral. Pt has RW, cane and bedside commode at home. Requesting tub bench. Contacted [facility] for Tub bench for home. They will ship to his home. Made pt aware.   Faxed dc summary to PCP's office with request for [facility] Medicaid PCS.   Expected Discharge Date:  07/22/17               Expected Discharge Plan:  [place]  In-House Referral:  Clinical Social Work  Discharge planning Services  CM Consult  Post Acute Care Choice:  Home Health Choice offered to:  Patient, [facility] POA / Guardian  DME Arranged:  Tub bench DME Agency:  [place]:  RN, PT [facility] Agency:  [place]  Status of Service:  Completed, signed off  If discussed at [place] of Stay Meetings, dates discussed:    Additional Comments:  Erenest Rasher, RN 07/22/2017, 1:30 PM

## 2017-07-22 NOTE — Progress Notes (Signed)
Occupational Therapy Evaluation Patient Details Name: Cody Webster MRN: [id number] DOB: 11-12-1965 Today's Date: 07/22/2017    History of Present Illness 52 y.o. male with a hx of poorly controlled DM and HTN with CKD stage IV/V, tobacco use, diabetic foot ulcer (left), chronic diastolic heart failure. Admitted with complaints of chest pain. Hx of neuropathic ulcer L plantar foot, DM, peripheral neuropathy, toe amp, CKD, Sz, CHF   Clinical Impression   Pt states he "needs someone to come out to the house to shave him and help him with everything". Educated pt on use of AE to help with ADL. And attempted to educate on energy conservation strategies and reducing risk of falls. Recommend use of tub bench to reduce risk of falls when bathing. Pt safe to DC home with family when medically stable.     Follow Up Recommendations  No OT follow up;Supervision - Intermittent  HH Aide if this is an option   Equipment Recommendations  Tub/shower bench    Recommendations for Other Services       Precautions / Restrictions Precautions Precautions: Fall Restrictions Weight Bearing Restrictions: No      Mobility Bed Mobility               General bed mobility comments: oob in recliner  Transfers Overall transfer level: Needs assistance Equipment used: Rolling walker (2 wheeled) Transfers: Sit to/from Stand Sit to Stand: Min guard         General transfer comment: Assist to steady once standing    Balance Overall balance assessment: Needs assistance           Standing balance-Leahy Scale: Fair                             ADL either performed or assessed with clinical judgement   ADL Overall ADL's : Needs assistance/impaired Eating/Feeding: Modified independent     Grooming Details (indicate cue type and reason): Pt staes he "gives out after 1 swipe of shaving". "i feel like I'm gonna give out and fall". Recommended pt sit while shaving and think about  using an Copy Upper Body Bathing: Set up;Sitting   Lower Body Bathing: Sit to/from stand;Set up Lower Body Bathing Details (indicate cue type and reason): states is is difficult to wash his feet at times Upper Body Dressing : Set up Upper Body Dressing Details (indicate cue type and reason): states it is difficult to get his jacket off his shoulder. Pt issued a reacher. Demonstrated how to use the reacher to assist with pushing his jacket off his shoulder Lower Body Dressing: Set up;Sit to/from stand Lower Body Dressing Details (indicate cue type and reason): staes it is "difficult but i get it done". Pt delcined use of sock aid. Educated pton use of reacher to help with dressing. Pt verbalized udnerstanding..     Toileting- Clothing Manipulation and Hygiene: Modified independent     Tub/Shower Transfer Details (indicate cue type and reason): States he does not getin the tub due tofear of falling. Pt educated on use of tub bench to sit on and slide legs over tub instead of stepping voer tub. Pt verbalized understanding.          Vision Baseline Vision/History: Legally blind(R eye; low vision L eye) Additional Comments: Able to read with reading glasses;      Perception     Praxis      Pertinent Vitals/Pain Pain Assessment: Faces Faces Pain  Scale: Hurts even more Pain Location: back Pain Descriptors / Indicators: Aching;Sore Pain Intervention(s): Limited activity within patient's tolerance     Hand Dominance Right   Extremity/Trunk Assessment Upper Extremity Assessment Upper Extremity Assessment: Generalized weakness(pt complains of B shoulder pain)   Lower Extremity Assessment Lower Extremity Assessment: Defer to PT evaluation   Cervical / Trunk Assessment Cervical / Trunk Assessment: Normal;Other exceptions(back pain)   Communication Communication Communication: No difficulties   Cognition Arousal/Alertness: Awake/alert Behavior During Therapy:  Restless;Impulsive Overall Cognitive Status: Within Functional Limits for tasks assessed                                     General Comments       Exercises     Shoulder Instructions      Home Living Family/patient expects to be discharged to:: Private residence Living Arrangements: Other relatives Available Help at Discharge: Family;Available 24 hours/day Type of Home: House Home Access: Level entry     Home Layout: Two level Alternate Level Stairs-Number of Steps: 1 flight Alternate Level Stairs-Rails: Right Bathroom Shower/Tub: Tub/shower unit;Curtain   Bathroom Toilet: Standard Bathroom Accessibility: Yes How Accessible: Accessible via walker Home Equipment: [place] - 2 wheels;Cane - single point          Prior Functioning/Environment Level of Independence: Independent                 OT Problem List: Decreased strength;Decreased activity tolerance;Impaired balance (sitting and/or standing);Decreased knowledge of use of DME or AE;Cardiopulmonary status limiting activity;Pain      OT Treatment/Interventions:      OT Goals(Current goals can be found in the care plan section) Acute Rehab OT Goals Patient Stated Goal: home OT Goal Formulation: All assessment and education complete, DC therapy  OT Frequency:     Barriers to D/C:            Co-evaluation              AM-PAC PT "6 Clicks" Daily Activity     Outcome Measure Help from another person eating meals?: None Help from another person taking care of personal grooming?: A Little Help from another person toileting, which includes using toliet, bedpan, or urinal?: A Little Help from another person bathing (including washing, rinsing, drying)?: A Little Help from another person to put on and taking off regular upper body clothing?: None Help from another person to put on and taking off regular lower body clothing?: A Little 6 Click Score: 20   End of Session Nurse  Communication: Mobility status;Other (comment)(DC needs)  Activity Tolerance: Patient tolerated treatment well Patient left: in chair;with call bell/phone within reach;with family/visitor present  OT Visit Diagnosis: Unsteadiness on feet (R26.81);Muscle weakness (generalized) (M62.81);Pain Pain - part of body: (all over)                Time: 8338-2505 OT Time Calculation (min): 16 min Charges:  OT General Charges $OT Visit: 1 Visit OT Evaluation $OT Eval Moderate Complexity: 1 Mod G-Codes:     Derren Suydam, OT/L  [phone] 07/22/2017  Beena Catano,HILLARY 07/22/2017, 12:26 PM

## 2017-07-22 NOTE — Evaluation (Signed)
Physical Therapy Evaluation-1x Patient Details Name: Cody Webster MRN: [id number] DOB: [birth year] Today's Date: 07/22/2017   History of Present Illness  52 y.o. male with a hx of poorly controlled DM and HTN with CKD stage IV/V, tobacco use, diabetic foot ulcer (left), chronic diastolic heart failure. Admitted with complaints of chest pain. Hx of neuropathic ulcer L plantar foot, DM, peripheral neuropathy, toe amp, CKD, Sz, CHF    Clinical Impression  On eval, pt required Min assist for mobility. He walked ~300 feet without an assistive device (pt declined RW use when offered to him). He presents with general weakness and impaired gait/balance. Encouraged RW/cane use for safe ambulation. Recommend HHPT f/u if possible.     Follow Up Recommendations Home health PT    Equipment Recommendations  None recommended by PT (Pt stated he already has a walker and cane)    Recommendations for Other Services       Precautions / Restrictions Precautions Precautions: Fall Restrictions Weight Bearing Restrictions: No      Mobility  Bed Mobility               General bed mobility comments: oob in recliner  Transfers Overall transfer level: Needs assistance Equipment used: Rolling walker (2 wheeled) Transfers: Sit to/from Stand Sit to Stand: Min assist         General transfer comment: Assist to steady once standing  Ambulation/Gait Ambulation/Gait assistance: Min assist Ambulation Distance (Feet): 300 Feet Assistive device: None       General Gait Details: Intermittent assist to steady while ambulating. L/R staggering and drifting noted. Slow gait speed. Cues for safety. Pt declined use of RW  Stairs            Wheelchair Mobility    Modified Rankin (Stroke Patients Only)       Balance Overall balance assessment: Needs assistance           Standing balance-Leahy Scale: Fair                               Pertinent Vitals/Pain Pain  Assessment: Faces Faces Pain Scale: Hurts even more Pain Location: back Pain Descriptors / Indicators: Aching;Sore Pain Intervention(s): Monitored during session;Repositioned    Home Living Family/patient expects to be discharged to:: Private residence Living Arrangements: Other relatives   Type of Home: House Home Access: Level entry     Home Layout: Two level Home Equipment: Environmental consultant - 2 wheels;Cane - single point      Prior Function Level of Independence: Independent               Hand Dominance        Extremity/Trunk Assessment   Upper Extremity Assessment Upper Extremity Assessment: Defer to OT evaluation    Lower Extremity Assessment Lower Extremity Assessment: Generalized weakness    Cervical / Trunk Assessment Cervical / Trunk Assessment: Normal  Communication   Communication: No difficulties  Cognition Arousal/Alertness: Awake/alert Behavior During Therapy: WFL for tasks assessed/performed Overall Cognitive Status: Within Functional Limits for tasks assessed                                        General Comments      Exercises     Assessment/Plan    PT Assessment All further PT needs can be met in the next venue of care(Home  Health PT)  PT Problem List Decreased strength;Decreased balance;Decreased mobility;Decreased activity tolerance;Pain;Decreased safety awareness;Decreased knowledge of use of DME       PT Treatment Interventions DME instruction;Functional mobility training;Balance training;Therapeutic activities;Gait training;Therapeutic exercise;Patient/family education    PT Goals (Current goals can be found in the Care Plan section)  Acute Rehab PT Goals Patient Stated Goal: homoe PT Goal Formulation: All assessment and education complete, DC therapy    Frequency     Barriers to discharge        Co-evaluation               AM-PAC PT "6 Clicks" Daily Activity  Outcome Measure Difficulty turning over  in bed (including adjusting bedclothes, sheets and blankets)?: None Difficulty moving from lying on back to sitting on the side of the bed? : None Difficulty sitting down on and standing up from a chair with arms (e.g., wheelchair, bedside commode, etc,.)?: A Little Help needed moving to and from a bed to chair (including a wheelchair)?: A Little Help needed walking in hospital room?: A Little Help needed climbing 3-5 steps with a railing? : A Little 6 Click Score: 20    End of Session Equipment Utilized During Treatment: Gait belt Activity Tolerance: Patient tolerated treatment well Patient left: in chair;with call bell/phone within reach;with chair alarm set   PT Visit Diagnosis: Muscle weakness (generalized) (M62.81);Difficulty in walking, not elsewhere classified (R26.2);Pain Pain - part of body: (back)    Time: 1134-1150 PT Time Calculation (min) (ACUTE ONLY): 16 min   Charges:   PT Evaluation $PT Eval Moderate Complexity: 1 Mod     PT G Codes:          Weston Anna, MPT Pager: [phone]

## 2017-07-22 NOTE — Discharge Summary (Signed)
Discharge Summary  Cody Webster VOZ:[id number] DOB: [birth year]  PCP: Kerin Perna, NP  Admit date: 07/20/2017 Discharge date: 07/22/2017  Time spent: <67mins  Recommendations for Outpatient Follow-up:  1. F/u with PMD within a week  for hospital discharge follow up, repeat cbc/bmp at follow up 2. F/u with cardiology 3. F/u with nephrology Dr Moshe Cipro 4. F/u with pain clinic for chronic pain.  Discharge Diagnoses:  Active Hospital Problems   Diagnosis Date Noted  . Chest pain 07/20/2017  . Acute renal failure superimposed on chronic kidney disease ([place]) 09/06/2014  . CKD (chronic kidney disease), stage IV ([place]) 07/11/2014  . CKD (chronic kidney disease) 12/11/2013  . Essential hypertension 12/10/2013  . Peripheral neuropathy ([place]) 12/10/2013    Resolved Hospital Problems  No resolved problems to display.    Discharge Condition: stable  Diet recommendation: heart healthy/carb modified/renal diet  Filed Weights   07/20/17 1042 07/20/17 1848  Weight: 92.6 kg (204 lb 3 oz) 91.7 kg (202 lb 1.6 oz)    History of present illness: (per admitting MD Dr Herbert Moors) PCP: Kerin Perna, NP   Patient coming from: Home  Chief Complaint: Chest pain  HPI: Cody Webster is a 52 y.o. male with medical history significant of type 1 diabetes complicated by peripheral neuropathy, stage V chronic kidney disease status post AV fistula in left upper extremity placed in August 2018, hypertension, hyperlipidemia, glaucoma who presents with chest pain.  Patient is an extremely poor historian and cannot provide any significant details for this chest pain.  He reports that he began to have chest pain that was center, and slightly to the left approximately 1 week ago.  The pain would last only for several minutes and then resolve on its own.  He could not describe the quality of the pain however called the midway point between squeezing and sharp.  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  He did not have any syncope or presyncope during this time however he did had some shortness of breath.  He did not have any diaphoresis.  He has not had any worsening lower extremity edema, orthopnea, paroxysmal nocturnal dyspnea, abdominal pain, palpitations, syncope, fevers, cough, congestion.  He reports his blood sugars have been extremely well controlled at his last A1c was 5.4.  Impressively for a type I diabetic he apparently has been weaned from insulin and is now merely on oral hypoglycemics.  ED Course: On admission his vital signs were stable.  His labs are notable for a creatinine of 5.18 which is around his baseline.  His potassium was 5.3 and he was mildly acidotic.  His alkaline phosphatase was 580.  His CBC showed an anemia of 9.8 which appeared to be stable for him.  His EKG showed J-point elevations in the precordial leads.  Cardiology was consulted and recommended inpatient admission for stress which, if positive, would necessitate transfer to [facility] for cardiac catheterization as contrast dye went to patient over into requiring dialysis.  Troponin was negative x1     Hospital Course:  Principal Problem:   Chest pain Active Problems:   Essential hypertension   Peripheral neuropathy (HCC)   CKD (chronic kidney disease)   CKD (chronic kidney disease), stage IV (HCC)   Acute renal failure superimposed on chronic kidney disease (HCC)   Chest pain With significant risk factor including diabetes, hypertension, CKD Troponin negative Cardiology consulted, per cardiology Stress  test " not a high risk study on further review.  He would not commit to long term dialysis.  Continue medical management unless he has clear unstable symptoms" -he is cleared to discharge home with outpatient cardiology follow up.   Hypertension He has elevated blood pressure on presentation. Lisinopril discontinued due  to hyperkalemia continue home medication Coreg, clonidine, increase hydralazine, Start Imdur bp better controlled at discharge.   Non-insulin-dependent type 2 diabetes -Report he was diagnosed when he was 25 - A1c 5.1 -Januvia held  in the setting of renal impairment, januvia dose decreased to 25mg  daily at discharge.   Chronic diabetic foot ulcer, left foot Does Not appear infected, wound care input appreciated  AKI on CKD 5 Status post AV fistula placement July 2018 He does not appear volume overloaded But he has hyperkalemia Case discussed with nephrology over the phone, if no improvement may need formal nephrology consult. Cr 5.5 on admission, cr 4.89 at discharge. k normalized.  Hyperkalemia Hold ACE inhibitor, Kayexalate, sodium bicarb k normalized at discharge.  He is to follow up with nephrology  Chronic low back pain,  no weakness, no bowel bladder incontinence Report oxycodone is not working, he requests Dilaudid He is advised to follow up with pain clinic  FTT; PT/OT eval, case manager notified to arrange home health    Code Status: DNR  Family Communication: patient and brothers  Disposition Plan: home with home health   Consultants:  Cardiology  Case discussed with nephrology Dr Jonnie Finner.  Procedures:  Stress test  Antibiotics:  none   Discharge Exam: BP (!) 155/70 (BP Location: Right Arm)   Pulse 85   Temp 98 F (36.7 C) (Oral)   Resp 19   Ht 6\' 1"  (1.854 m)   Wt 91.7 kg (202 lb 1.6 oz)   SpO2 99%   BMI 26.66 kg/m   General: NAD, sitting up in chair, right eye blind Cardiovascular: RRR Respiratory: CTABL Extremity: No Edema, left foot chronic ulcer, no drainage, no erythema, no odor     Discharge Instructions You were cared for by a hospitalist during your hospital stay. If you have any questions about your discharge medications or the care you received while you were in the hospital after you are discharged, you  can call the unit and asked to speak with the hospitalist on call if the hospitalist that took care of you is not available. Once you are discharged, your primary care physician will handle any further medical issues. Please note that NO REFILLS for any discharge medications will be authorized once you are discharged, as it is imperative that you return to your primary care physician (or establish a relationship with a primary care physician if you do not have one) for your aftercare needs so that they can reassess your need for medications and monitor your lab values.  Discharge Instructions    Diet general   Complete by:  As directed    Renal diet/low potassium diet, carb modified diet, heart healthy diet.   Increase activity slowly   Complete by:  As directed      Allergies as of 07/22/2017      Reactions   Influenza Vaccines    Chest congestion, fever   Morphine And Related Shortness Of Breath, Other (See Comments)   Hallucination  Tolerates Norco/Vicodin   Pneumococcal Vaccines Nausea And Vomiting   FEVER      Medication List    STOP taking these medications   cephALEXin 500 MG  capsule Commonly known as:  KEFLEX   lisinopril 5 MG tablet Commonly known as:  PRINIVIL,ZESTRIL     TAKE these medications   acetaminophen 500 MG tablet Commonly known as:  TYLENOL Take 1,000 mg by mouth every 6 (six) hours as needed (for pain/headaches.).   aspirin EC 81 MG tablet Take 81 mg by mouth daily.   atorvastatin 80 MG tablet Commonly known as:  LIPITOR Take 1 tablet (80 mg total) by mouth daily at 6 PM.   carvedilol 25 MG tablet Commonly known as:  COREG Take 25 mg by mouth 2 (two) times daily. Reported on 05/27/2015   cetirizine 10 MG tablet Commonly known as:  ZYRTEC Take 10 mg by mouth daily.   cloNIDine 0.1 MG tablet Commonly known as:  CATAPRES Take 0.1 mg by mouth 3 (three) times daily.   cyclobenzaprine 5 MG tablet Commonly known as:  FLEXERIL Take 1 tablet (5 mg  total) by mouth 3 (three) times daily as needed for muscle spasms.   gabapentin 300 MG capsule Commonly known as:  NEURONTIN Take 1 capsule (300 mg total) by mouth 2 (two) times daily.   hydrALAZINE 50 MG tablet Commonly known as:  APRESOLINE Take 2 tablets (100 mg total) by mouth 3 (three) times daily. What changed:  how much to take   hydroxypropyl methylcellulose / hypromellose 2.5 % ophthalmic solution Commonly known as:  ISOPTO TEARS / GONIOVISC Place 1 drop into both eyes 3 (three) times daily as needed for dry eyes.   isosorbide mononitrate 60 MG 24 hr tablet Commonly known as:  IMDUR Take 1 tablet (60 mg total) by mouth daily. Start taking on:  07/23/2017   nitroGLYCERIN 0.4 MG SL tablet Commonly known as:  NITROSTAT Place 1 tablet (0.4 mg total) under the tongue every 5 (five) minutes as needed for chest pain (CP or SOB).   oxyCODONE-acetaminophen 10-325 MG tablet Commonly known as:  PERCOCET Take 1 tablet by mouth every 6 (six) hours as needed. For pain.   prednisoLONE acetate 1 % ophthalmic suspension Commonly known as:  PRED FORTE Place 1 drop into the right eye 2 (two) times daily.   senna-docusate 8.6-50 MG tablet Commonly known as:  Senokot-S Take 1 tablet by mouth at bedtime.   sitaGLIPtin 25 MG tablet Commonly known as:  JANUVIA Take 1 tablet (25 mg total) by mouth daily. What changed:    medication strength  how much to take   TRAVATAN Z 0.004 % Soln ophthalmic solution Generic drug:  Travoprost (BAK Free) INSTILL ONE DROP INTO LEFT EYE DAILY   traZODone 50 MG tablet Commonly known as:  DESYREL Take 50 mg by mouth at bedtime.            Durable Medical Equipment  (From admission, onward)        Start     Ordered   07/22/17 1215  For home use only DME Tub bench  Once     07/22/17 1214     Allergies  Allergen Reactions  . Influenza Vaccines     Chest congestion, fever  . Morphine And Related Shortness Of Breath and Other (See  Comments)    Hallucination  Tolerates Norco/Vicodin  . Pneumococcal Vaccines Nausea And Vomiting    FEVER   Follow-up Information    Almyra Deforest, [place] Follow up on 08/11/2017.   Specialties:  Cardiology, Radiology Why:  7:45 am hospital follow up Contact information: [address] [place] [address] [phone]  Kerin Perna, NP Follow up in 1 week(s).   Specialty:  Internal Medicine Why:  hospital discharge follow up Contact information: [place] [address] [phone]        Leonie Man, MD Follow up.   Specialty:  Cardiology Contact information: [address] [place] [address] [phone]        Corliss Parish, MD Follow up.   Specialty:  Nephrology Contact information: [place] [address] [phone]        follow up with pain clinic for chronic pain. Follow up.            The results of significant diagnostics from this hospitalization (including imaging, microbiology, ancillary and laboratory) are listed below for reference.    Significant Diagnostic Studies: Dg Chest 2 View  Result Date: 07/20/2017 CLINICAL DATA:  Chest pain. EXAM: CHEST - 2 VIEW COMPARISON:  Radiographs of April 25, 2016. FINDINGS: The heart size and mediastinal contours are within normal limits. Both lungs are clear. No pneumothorax or pleural effusion is noted. The visualized skeletal structures are unremarkable. IMPRESSION: No active cardiopulmonary disease. Electronically Signed   By: Marijo Conception, M.D.   On: 07/20/2017 10:55   Nm Myocar Multi W/spect W/wall Motion / Ef  Result Date: 07/21/2017 CLINICAL DATA:  Chest pain, concern for ischemia EXAM: MYOCARDIAL IMAGING WITH SPECT (REST AND PHARMACOLOGIC-STRESS) GATED LEFT VENTRICULAR WALL MOTION STUDY LEFT VENTRICULAR EJECTION FRACTION TECHNIQUE: Standard myocardial SPECT imaging was performed after resting intravenous injection  of 10 mCi Tc-62m tetrofosmin. Subsequently, intravenous infusion of Lexiscan was performed under the supervision of the Cardiology staff. At peak effect of the drug, 30 mCi Tc-41m tetrofosmin was injected intravenously and standard myocardial SPECT imaging was performed. Quantitative gated imaging was also performed to evaluate left ventricular wall motion, and estimate left ventricular ejection fraction. COMPARISON:  None. FINDINGS: EKG: *ST segment depression was noted during stress in the II, V5 and V6 leads, and returning to baseline after less than 1 minute of recovery. Perfusion: There is a moderate size region of moderate decreased counts in the mid and basilar segment of the inferior wall which improves from stress imaging to rest imaging. Wall Motion: Mild LEFT ventricular dilatation. Mild septal hypokinesia. Left Ventricular Ejection Fraction: 47 % End diastolic volume 419 ml End systolic volume 90 ml IMPRESSION: 1. Moderate size region of moderate ischemia in the inferior basilar segment of the inferior wall. 2. Mild LEFT ventricular dilatation. 3. Left ventricular ejection fraction 47% 4. Non invasive risk stratification*: Intermediate to high *2012 Appropriate Use Criteria for Coronary Revascularization Focused Update: J Am Coll Cardiol. 6222;97(9):892-119. http://content.airportbarriers.com.aspx?articleid=1201161 These results will be called to the ordering clinician or representative by the Radiologist Assistant, and communication documented in the PACS or zVision Dashboard. Electronically Signed   By: Suzy Bouchard M.D.   On: 07/21/2017 14:05    Microbiology: No results found for this or any previous visit (from the past 240 hour(s)).   Labs: Basic Metabolic Panel: Recent Labs  Lab 07/20/17 1109 07/20/17 1117 07/21/17 0543 07/21/17 1635 07/22/17 0119  NA 138 140 140 139 141  K 5.3* 5.3* 5.5* 5.5* 4.8  CL 114* 114* 114* 112* 110  CO2 19*  --  21* 21* 23  GLUCOSE 111* 105* 89  125* 131*  BUN 46* 39* 47* 47* 47*  CREATININE 5.18* 5.50* 4.93* 4.79* 4.89*  CALCIUM 8.7*  --  8.8* 9.1 8.9   Liver Function Tests: Recent Labs  Lab 07/20/17 1109  AST 34  ALT 35  ALKPHOS 580*  BILITOT 0.3  PROT 6.8  ALBUMIN 3.4*   No results for input(s): LIPASE, AMYLASE in the last 168 hours. No results for input(s): AMMONIA in the last 168 hours. CBC: Recent Labs  Lab 07/20/17 1109 07/20/17 1117 07/21/17 0543 07/22/17 0119  WBC 4.7  --  4.4 4.7  NEUTROABS 2.8  --   --  3.3  HGB 9.8* 10.2* 10.1* 10.5*  HCT 29.6* 30.0* 31.4* 31.8*  MCV 95.2  --  96.0 94.6  PLT 146*  --  166 178   Cardiac Enzymes: Recent Labs  Lab 07/21/17 1302 07/21/17 1630 07/22/17 0119  CKTOTAL  --   --  125  TROPONINI <0.03 0.03* 0.04*   BNP: BNP (last 3 results) Recent Labs    07/20/17 1109  BNP 101.9*    ProBNP (last 3 results) No results for input(s): PROBNP in the last 8760 hours.  CBG: Recent Labs  Lab 07/21/17 1154 07/21/17 1707 07/21/17 2117 07/22/17 0743 07/22/17 1147  GLUCAP 110* 126* 130* 93 167*       Signed:  Florencia Reasons MD, PhD  [facility] 07/22/2017, 12:22 PM

## 2017-07-22 NOTE — Progress Notes (Signed)
Patient is stable for discharge. Discharge instructions and medications have been reviewed with the patient and all questions answered. AVS given to patient. Prescriptions sent electronically to [facility] on [facility], per pt request.

## 2017-07-29 NOTE — Progress Notes (Signed)
Received call from niece, states she spoke to PCP office and they are not familiar with [place] [employer] [facility]. Faxed form to PCP's office. Contacted [facility] and they have planned visit for today. Jonnie Finner RN CCM Case Mgmt phone [phone]

## 2017-08-11 ENCOUNTER — Ambulatory Visit: Payer: Medicaid Other | Admitting: Physician Assistant

## 2017-08-11 ENCOUNTER — Other Ambulatory Visit: Payer: Self-pay | Admitting: Physician Assistant

## 2017-08-11 ENCOUNTER — Encounter: Payer: Self-pay | Admitting: Physician Assistant

## 2017-08-11 VITALS — BP 122/60 | HR 70 | Ht 73.0 in | Wt 200.4 lb

## 2017-08-11 DIAGNOSIS — R9439 Abnormal result of other cardiovascular function study: Secondary | ICD-10-CM

## 2017-08-11 DIAGNOSIS — I10 Essential (primary) hypertension: Secondary | ICD-10-CM | POA: Diagnosis not present

## 2017-08-11 DIAGNOSIS — I5032 Chronic diastolic (congestive) heart failure: Secondary | ICD-10-CM

## 2017-08-11 DIAGNOSIS — N184 Chronic kidney disease, stage 4 (severe): Secondary | ICD-10-CM | POA: Diagnosis not present

## 2017-08-11 DIAGNOSIS — E785 Hyperlipidemia, unspecified: Secondary | ICD-10-CM

## 2017-08-11 DIAGNOSIS — Z720 Tobacco use: Secondary | ICD-10-CM | POA: Diagnosis not present

## 2017-08-11 DIAGNOSIS — Z79899 Other long term (current) drug therapy: Secondary | ICD-10-CM

## 2017-08-11 MED ORDER — ATORVASTATIN CALCIUM 80 MG PO TABS: 80.0000 mg | ORAL_TABLET | Freq: Every day | ORAL | 3 refills | Status: DC

## 2017-08-11 MED ORDER — CARVEDILOL 25 MG PO TABS: 25.0000 mg | ORAL_TABLET | Freq: Two times a day (BID) | ORAL | 3 refills | Status: DC

## 2017-08-11 MED ORDER — HYDRALAZINE HCL 50 MG PO TABS: 100.0000 mg | ORAL_TABLET | Freq: Three times a day (TID) | ORAL | 3 refills | Status: DC

## 2017-08-11 MED ORDER — ISOSORBIDE MONONITRATE ER 60 MG PO TB24: 60.0000 mg | ORAL_TABLET | Freq: Every day | ORAL | 3 refills | Status: DC

## 2017-08-11 NOTE — Progress Notes (Signed)
Cardiology Office Note    Date:  08/11/2017   ID:  Cody Webster, DOB 01-May-1966, MRN [id number]  PCP:  Kerin Perna, NP  Cardiologist:  Dr. Ellyn Hack   Chief Complaint  Patient presents with  . Hospitalization Follow-up    seen for Dr. Ellyn Hack, abnormal stress test, medical management    History of Present Illness:  Cody Webster is a 52 y.o. male with PMH of DM II with L diabetic foot ulcer, HTN, HLD, CKD stage IV/V, tobacco use, chronic diastolic HF and carotid artery disease.  Patient was previously seen by Dr. Ellyn Hack in June 2018 for preoperative clearance prior to AV fistula placement.  He was cleared to proceed with surgery without any additional workup given lack of symptom and high risk of contrast nephropathy.  Previous echocardiogram in 2016 showed a preserved EF, possible wall motion abnormality.  He went through with left AV fistula placement in June 2355 without complication.  More recently he presented to the hospital on 07/20/2017 with chest pain and back pain for the past 3-52-month.  He also described some dyspnea as well.  Myoview obtained on 07/21/2017 showed EF 47%, moderate sized moderate ischemia in the inferior basilar segment of the inferior wall, intermediate to high risk finding.  Echocardiogram obtained on the same day showed EF 55-60%, moderate LVH, grade 1 DD.  The Myoview was reviewed by Dr. Oval Linsey, however it was felt reversible ischemia is in a small area and it does not warrant cardiac catheterization in the absence of anginal symptom.  Patient presents to cardiology office visit today.  He denies any recent chest pain.  He does not have any lower extremity edema, orthopnea or PND.  He has been compliant with his medication however request refills on carvedilol, Lipitor, hydralazine and Imdur.  He denies significant headache on Imdur or myalgia.  He is due to have a fasting lipid panel and LFT in 2-4 weeks.  He can follow-up with Dr. Ellyn Hack in  3-25-month.   Past Medical History:  Diagnosis Date  . Carotid artery occlusion   . CHF (congestive heart failure) ([place])   . 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 Diabetes mellitus without complication ([place])    type 2  . GERD (gastroesophageal reflux disease)   . Hypertension   . Peripheral neuropathy   . Pneumonia   . Seizures ([place])    27 years ago    Past Surgical History:  Procedure Laterality Date  . AV FISTULA PLACEMENT Left 11/08/2016   Procedure: ARTERIOVENOUS (AV) FISTULA CREATION;  Surgeon: Angelia Mould, MD;  Location: [facility];  Service: Vascular;  Laterality: Left;  . right foot surgery    . TOE AMPUTATION      Current Medications: Outpatient Medications Prior to Visit  Medication Sig Dispense Refill  . acetaminophen (TYLENOL) 500 MG tablet Take 1,000 mg by mouth every 6 (six) hours as needed (for pain/headaches.).    Marland Kitchen aspirin EC 81 MG tablet Take 81 mg by mouth daily.    . cetirizine (ZYRTEC) 10 MG tablet Take 10 mg by mouth daily.  6  . cloNIDine (CATAPRES) 0.1 MG tablet Take 0.1 mg by mouth 3 (three) times daily.    . cyclobenzaprine (FLEXERIL) 5 MG tablet Take 1 tablet (5 mg total) by mouth 3 (three) times daily as needed for muscle spasms. 30 tablet 0  . gabapentin (NEURONTIN) 300 MG capsule Take 1 capsule (300 mg total) by mouth 2 (two) times  daily. 60 capsule 3  . hydroxypropyl methylcellulose / hypromellose (ISOPTO TEARS / GONIOVISC) 2.5 % ophthalmic solution Place 1 drop into both eyes 3 (three) times daily as needed for dry eyes.    . nitroGLYCERIN (NITROSTAT) 0.4 MG SL tablet Place 1 tablet (0.4 mg total) under the tongue every 5 (five) minutes as needed for chest pain (CP or SOB). 30 tablet 0  . oxyCODONE-acetaminophen (PERCOCET) 10-325 MG tablet Take 1 tablet by mouth every 6 (six) hours as needed. For pain. 8 tablet 0  . prednisoLONE acetate (PRED FORTE) 1 % ophthalmic suspension Place 1 drop into the right eye 2 (two) times daily.    Marland Kitchen  senna-docusate (SENOKOT-S) 8.6-50 MG tablet Take 1 tablet by mouth at bedtime. 30 tablet 0  . sitaGLIPtin (JANUVIA) 25 MG tablet Take 1 tablet (25 mg total) by mouth daily. 30 tablet 0  . TRAVATAN Z 0.004 % SOLN ophthalmic solution INSTILL ONE DROP INTO LEFT EYE DAILY  6  . traZODone (DESYREL) 50 MG tablet Take 50 mg by mouth at bedtime.  3  . atorvastatin (LIPITOR) 80 MG tablet Take 1 tablet (80 mg total) by mouth daily at 6 PM. 30 tablet 0  . carvedilol (COREG) 25 MG tablet Take 25 mg by mouth 2 (two) times daily. Reported on 05/27/2015  0  . hydrALAZINE (APRESOLINE) 50 MG tablet Take 2 tablets (100 mg total) by mouth 3 (three) times daily. 90 tablet 0  . isosorbide mononitrate (IMDUR) 60 MG 24 hr tablet Take 1 tablet (60 mg total) by mouth daily. 30 tablet 0   No facility-administered medications prior to visit.      Allergies:   Influenza vaccines; Morphine and related; and Pneumococcal vaccines   Social History   Socioeconomic History  . Marital status: Single    Spouse name: Not on file  . Number of children: 1  . Years of education: Not on file  . Highest education level: Not on file  Occupational History    Comment: Disabled - for Back pain & foot ulcer  Social Needs  . Financial resource strain: Not on file  . Food insecurity:    Worry: Not on file    Inability: Not on file  . Transportation needs:    Medical: Not on file    Non-medical: Not on file  Tobacco Use  . Smoking status: Current Every Day Smoker    Packs/day: 1.00    Years: 30.00    Pack years: 30.00    Types: Cigarettes, Cigars  . Smokeless tobacco: Former Systems developer    Types: Snuff, Sarina Ser    Quit date: 12/11/1983  Substance and Sexual Activity  . Alcohol use: Yes    Comment: ocassaionlly   . Drug use: No  . Sexual activity: Not Currently  Lifestyle  . Physical activity:    Days per week: Not on file    Minutes per session: Not on file  . Stress: Not on file  Relationships  . Social connections:     Talks on phone: Not on file    Gets together: Not on file    Attends religious service: Not on file    Active member of club or organization: Not on file    Attends meetings of clubs or organizations: Not on file    Relationship status: Not on file  Other Topics Concern  . Not on file  Social History Narrative  . Not on file     Family History:  The patient's  family history includes CAD in his mother and sister; Diabetes type II in his brother and father; Hypertension in his sister.   ROS:   Please see the history of present illness.    ROS All other systems reviewed and are negative.   PHYSICAL EXAM:   VS:  BP 122/60   Pulse 70   Ht 6\' 1"  (1.854 m)   Wt 200 lb 6.4 oz (90.9 kg)   SpO2 98%   BMI 26.44 kg/m    GEN: Well nourished, well developed, in no acute distress  HEENT: normal  Neck: no JVD, carotid bruits, or masses Cardiac: RRR; no murmurs, rubs, or gallops,no edema  Respiratory:  clear to auscultation bilaterally, normal work of breathing GI: soft, nontender, nondistended, + BS MS: no deformity or atrophy  Skin: warm and dry, no rash Neuro:  Alert and Oriented x 3, Strength and sensation are intact Psych: euthymic mood, full affect  Wt Readings from Last 3 Encounters:  08/11/17 200 lb 6.4 oz (90.9 kg)  07/20/17 202 lb 1.6 oz (91.7 kg)  03/29/17 217 lb (98.4 kg)      Studies/Labs Reviewed:   EKG:  EKG is not ordered today.    Recent Labs: 07/20/2017: ALT 35; B Natriuretic Peptide 101.9 07/22/2017: BUN 47; Creatinine, Ser 4.89; Hemoglobin 10.5; Platelets 178; Potassium 4.8; Sodium 141   Lipid Panel    Component Value Date/Time   CHOL 230 (H) 07/21/2017 0543   CHOL 247 (H) 07/21/2017 0543   TRIG 177 (H) 07/21/2017 0543   TRIG 182 (H) 07/21/2017 0543   HDL 64 07/21/2017 0543   HDL 64 07/21/2017 0543   CHOLHDL 3.6 07/21/2017 0543   CHOLHDL 3.9 07/21/2017 0543   VLDL 35 07/21/2017 0543   VLDL 36 07/21/2017 0543   LDLCALC 131 (H) 07/21/2017 0543   LDLCALC  147 (H) 07/21/2017 0543    Additional studies/ records that were reviewed today include:    Myoview 07/21/2017 IMPRESSION: 1. Moderate size region of moderate ischemia in the inferior basilar segment of the inferior wall.  2. Mild LEFT ventricular dilatation.  3. Left ventricular ejection fraction 47%  4. Non invasive risk stratification*: Intermediate to high    Echo 07/21/2017 LV EF: 55% -   60%  Study Conclusions  - Left ventricle: The cavity size was normal. Wall thickness was   increased in a pattern of moderate LVH. Systolic function was   normal. The estimated ejection fraction was in the range of 55%   to 60%. Wall motion was normal; there were no regional wall   motion abnormalities. Doppler parameters are consistent with   abnormal left ventricular relaxation (grade 1 diastolic   dysfunction). The E/e&' ratio is between 8-15, suggesting   indeterminate LV filling pressure. - Mitral valve: Mildly thickened leaflets . There was trivial   regurgitation. - Right ventricle: The cavity size was mildly dilated. - Right atrium: Moderately dilated. - Inferior vena cava: The vessel was normal in size. The   respirophasic diameter changes were in the normal range (>= 50%),   consistent with normal central venous pressure.  Impressions:  - Compared with a prior study in 2016, the LVEF is higher at   55-60%, with grade 1 DD and indeterminate LV filling pressure.    ASSESSMENT:    1. Abnormal stress test   2. Essential hypertension   3. Medication management   4. Hyperlipidemia, unspecified hyperlipidemia type   5. Chronic diastolic heart failure ([place])   6. Tobacco  abuse   7. CKD (chronic kidney disease), stage IV (HCC)      PLAN:  In order of problems listed above:  1. Abnormal Myoview: Recently, patient presented to the hospital with episode of chest pain, Myoview showed small ischemia in the inferior aspect of the heart.  Given high risk for contrast  nephropathy, we planned for medical management instead.  He was placed on hydralazine and Imdur.  He denied any recurrent chest pain since he left the hospital.  Continue aspirin and high-dose statin.  2. Hypertension: Blood pressure very well controlled on current medication.  Refill blood pressure medication  3. Hyperlipidemia: Placed on 80 mg daily of Lipitor recently.  Fasting lipid panel and LFT in 2-4 weeks.  4. Chronic diastolic heart failure: Appears to be euvolemic on physical exam.  5. Tobacco abuse: Continue to smoke, patient is aware of the harm of tobacco and association with coronary artery disease, tobacco cessation strongly advised.  6. CKD stage IV-V: Followed by Dr. Corliss Parish    Medication Adjustments/Labs and Tests Ordered: Current medicines are reviewed at length with the patient today.  Concerns regarding medicines are outlined above.  Medication changes, Labs and Tests ordered today are listed in the Patient Instructions below. Patient Instructions   Medication Instructions:  Your physician recommends that you continue on your current medications as directed. Please refer to the Current Medication list given to you today.   Labwork: Your physician recommends that you return for lab work in: 2-3 [place]   Testing/Procedures: none  Follow-Up: Your physician recommends that you schedule a follow-up appointment in: 3-4 months with Dr. Ellyn Hack.   Any Other Special Instructions Will Be Listed Below (If Applicable).     If you need a refill on your cardiac medications before your next appointment, please call your pharmacy.        Hilbert Corrigan, [place]  08/11/2017 8:37 AM    [place], [place],   [address] Phone: [phone]; Fax: [phone]

## 2017-08-11 NOTE — Patient Instructions (Addendum)
  Medication Instructions:  Your physician recommends that you continue on your current medications as directed. Please refer to the Current Medication list given to you today.   Labwork: Your physician recommends that you return for lab work in: 2-3 [place]   Testing/Procedures: none  Follow-Up: Your physician recommends that you schedule a follow-up appointment in: 3-4 months with Dr. Ellyn Hack.   Any Other Special Instructions Will Be Listed Below (If Applicable).     If you need a refill on your cardiac medications before your next appointment, please call your pharmacy.

## 2017-08-30 LAB — HEPATIC FUNCTION PANEL
ALBUMIN: 4 g/dL (ref 3.5–5.5)
ALT: 50 IU/L — ABNORMAL HIGH (ref 0–44)
AST: 53 IU/L — AB (ref 0–40)
Alkaline Phosphatase: 708 IU/L — ABNORMAL HIGH (ref 39–117)
Bilirubin Total: 0.3 mg/dL (ref 0.0–1.2)
Bilirubin, Direct: 0.16 mg/dL (ref 0.00–0.40)
Total Protein: 6.8 g/dL (ref 6.0–8.5)

## 2017-08-30 LAB — LIPID PANEL
CHOLESTEROL TOTAL: 163 mg/dL (ref 100–199)
Chol/HDL Ratio: 2.1 ratio (ref 0.0–5.0)
HDL: 78 mg/dL (ref >39)
LDL Calculated: 65 mg/dL (ref 0–99)
Triglycerides: 102 mg/dL (ref 0–149)
VLDL Cholesterol Cal: 20 mg/dL (ref 5–40)

## 2017-08-31 NOTE — Progress Notes (Signed)
Cholesterol is very well controlled at this point, however liver enzyme shows severely elevated alkaline phosphatase, this has been elevated for the past year. Liver enzyme (AST and ALT) also mildly trending up. Recommend forward labwork to his PCP and repeat liver enzyme  (LFT) in 1 month.

## 2017-09-11 ENCOUNTER — Other Ambulatory Visit (INDEPENDENT_AMBULATORY_CARE_PROVIDER_SITE_OTHER): Payer: Self-pay | Admitting: Orthopedic Surgery

## 2017-09-15 ENCOUNTER — Telehealth: Payer: Self-pay | Admitting: Cardiology

## 2017-09-15 NOTE — Telephone Encounter (Signed)
Error no note needed/jmp

## 2017-09-18 DIAGNOSIS — N189 Chronic kidney disease, unspecified: Secondary | ICD-10-CM | POA: Diagnosis not present

## 2017-09-18 DIAGNOSIS — I10 Essential (primary) hypertension: Secondary | ICD-10-CM | POA: Diagnosis not present

## 2017-09-18 DIAGNOSIS — F4310 Post-traumatic stress disorder, unspecified: Secondary | ICD-10-CM | POA: Diagnosis not present

## 2017-09-18 DIAGNOSIS — M545 Low back pain: Secondary | ICD-10-CM | POA: Diagnosis not present

## 2017-09-18 DIAGNOSIS — E1165 Type 2 diabetes mellitus with hyperglycemia: Secondary | ICD-10-CM | POA: Diagnosis not present

## 2017-09-19 DIAGNOSIS — I10 Essential (primary) hypertension: Secondary | ICD-10-CM | POA: Diagnosis not present

## 2017-09-19 DIAGNOSIS — F4310 Post-traumatic stress disorder, unspecified: Secondary | ICD-10-CM | POA: Diagnosis not present

## 2017-09-19 DIAGNOSIS — M545 Low back pain: Secondary | ICD-10-CM | POA: Diagnosis not present

## 2017-09-19 DIAGNOSIS — N189 Chronic kidney disease, unspecified: Secondary | ICD-10-CM | POA: Diagnosis not present

## 2017-09-19 DIAGNOSIS — E1165 Type 2 diabetes mellitus with hyperglycemia: Secondary | ICD-10-CM | POA: Diagnosis not present

## 2017-09-20 DIAGNOSIS — N189 Chronic kidney disease, unspecified: Secondary | ICD-10-CM | POA: Diagnosis not present

## 2017-09-20 DIAGNOSIS — F4310 Post-traumatic stress disorder, unspecified: Secondary | ICD-10-CM | POA: Diagnosis not present

## 2017-09-20 DIAGNOSIS — I10 Essential (primary) hypertension: Secondary | ICD-10-CM | POA: Diagnosis not present

## 2017-09-20 DIAGNOSIS — E1165 Type 2 diabetes mellitus with hyperglycemia: Secondary | ICD-10-CM | POA: Diagnosis not present

## 2017-09-20 DIAGNOSIS — M545 Low back pain: Secondary | ICD-10-CM | POA: Diagnosis not present

## 2017-09-21 ENCOUNTER — Encounter (INDEPENDENT_AMBULATORY_CARE_PROVIDER_SITE_OTHER): Payer: Self-pay | Admitting: Orthopedic Surgery

## 2017-09-21 ENCOUNTER — Ambulatory Visit (INDEPENDENT_AMBULATORY_CARE_PROVIDER_SITE_OTHER): Payer: Medicaid Other | Admitting: Orthopedic Surgery

## 2017-09-21 DIAGNOSIS — L97523 Non-pressure chronic ulcer of other part of left foot with necrosis of muscle: Secondary | ICD-10-CM

## 2017-09-21 DIAGNOSIS — E10621 Type 1 diabetes mellitus with foot ulcer: Secondary | ICD-10-CM | POA: Diagnosis not present

## 2017-09-21 NOTE — Progress Notes (Signed)
Office Visit Note   Patient: Cody Webster           Date of Birth: 1965/05/25           MRN: [id number] Visit Date: 09/21/2017              Requested by: Kerin Perna, NP [address] [place], [place] [address] PCP: Kerin Perna, NP  Chief Complaint  Patient presents with  . Left Foot - Wound Check      HPI: Patient is a 61 gentleman who presents in follow-up for a Waggoner grade 1 ulcer left foot fourth metatarsal head.  Patient was last seen in the office in November 2018.  Patient states he has been using an antibiotic ointment.  Assessment & Plan: Visit Diagnoses:  1. 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  May need pressure unloading modifications or additional treatment at follow-up.  Follow-Up Instructions: Return in about 1 month (around 10/19/2017).   Ortho Exam  Patient is alert, oriented, no adenopathy, well-dressed, normal affect, normal respiratory effort. Examination patient has a strong dorsalis pedis pulse is dorsiflexion to neutral.  He has multiple healed ulcers in the plantar aspect of his foot but has a large ulcer beneath the fourth metatarsal head with callus skin and epibole.  After informed consent a 10 blade knife was used to debride skin and soft tissue back to bleeding viable granulation tissue.  This was touched with silver nitrate a Band-Aid was applied.  The ulcer is 2 cm in diameter 3 mm deep this does not probe to bone or tendon there is 100% healthy tissue at the base of the wound there is no tenderness to palpation.  Imaging: No results found. No images are attached to the encounter.  Labs: Lab Results  Component Value Date   HGBA1C 5.1 07/20/2017   HGBA1C 6.7 (H) 04/23/2016   HGBA1C 10.7 (H) 09/06/2014   ESRSEDRATE 25 (H) 12/10/2013   CRP <0.5 (L) 12/10/2013   REPTSTATUS 04/30/2016 FINAL 04/24/2016   REPTSTATUS 04/30/2016 FINAL 04/24/2016   CULT  04/24/2016    NO GROWTH 5 DAYS Performed at [facility]  04/24/2016    NO GROWTH 5 DAYS Performed at [facility]     Lab Results  Component Value Date/Time   HGBA1C 5.1 07/20/2017 06:51 PM   HGBA1C 6.7 (H) 04/23/2016 03:00 PM   HGBA1C 10.7 (H) 09/06/2014 05:36 AM    There is no height or weight on file to calculate BMI.  Orders:  No orders of the defined types were placed in this encounter.  No orders of the defined types were placed in this encounter.    Procedures: No procedures performed  Clinical Data: No additional findings.  ROS:  All other systems negative, except as noted in the HPI. Review of Systems  Objective: Vital Signs: There were no vitals taken for this visit.  Specialty Comments:  No specialty comments available.  PMFS History: Patient Active Problem List   Diagnosis Date Noted  . Chest pain 07/20/2017  . Preop cardiovascular exam 11/04/2016  . Malnutrition of moderate degree ([place]) 01/12/2015  . DM type 2, uncontrolled, with neuropathy ([place]) 01/11/2015  . Glaucoma   . Eye pain   . Acute glaucoma of  right eye 09/06/2014  . Acute renal failure superimposed on chronic kidney disease ([place]) 09/06/2014  . Anemia 09/06/2014  . Tobacco use disorder 09/06/2014  . Frequent headaches 07/11/2014  . CKD (chronic kidney disease), stage IV ([place]) 07/11/2014  . Ataxia 07/11/2014  . CKD (chronic kidney disease) 12/11/2013  . Diabetic foot ulcer ([place]) 12/10/2013  . Essential hypertension 12/10/2013  . Peripheral neuropathy ([place]) 12/10/2013   Past Medical History:  Diagnosis Date  . Carotid artery occlusion   . CHF (congestive heart failure) ([place])   . 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 Diabetes mellitus without complication ([place])    type 2  . GERD (gastroesophageal reflux disease)   . Hypertension   .  Peripheral neuropathy   . Pneumonia   . Seizures ([place])    27 years ago    Family History  Problem Relation Age of Onset  . CAD Mother   . Diabetes type II Father   . Hypertension Sister   . CAD Sister   . Diabetes type II Brother     Past Surgical History:  Procedure Laterality Date  . AV FISTULA PLACEMENT Left 11/08/2016   Procedure: ARTERIOVENOUS (AV) FISTULA CREATION;  Surgeon: Angelia Mould, MD;  Location: [facility] OR;  Service: Vascular;  Laterality: Left;  . right foot surgery    . TOE AMPUTATION     Social History   Occupational History    Comment: Disabled - for Back pain & foot ulcer  Tobacco Use  . Smoking status: Current Every Day Smoker    Packs/day: 1.00    Years: 30.00    Pack years: 30.00    Types: Cigarettes, Cigars  . Smokeless tobacco: Former Systems developer    Types: Snuff, Sarina Ser    Quit date: 12/11/1983  Substance and Sexual Activity  . Alcohol use: Yes    Comment: ocassaionlly   . Drug use: No  . Sexual activity: Not Currently

## 2017-10-19 ENCOUNTER — Encounter (INDEPENDENT_AMBULATORY_CARE_PROVIDER_SITE_OTHER): Payer: Self-pay | Admitting: Orthopedic Surgery

## 2017-10-19 ENCOUNTER — Ambulatory Visit (INDEPENDENT_AMBULATORY_CARE_PROVIDER_SITE_OTHER): Payer: Medicaid Other | Admitting: Orthopedic Surgery

## 2017-10-19 DIAGNOSIS — E10621 Type 1 diabetes mellitus with foot ulcer: Secondary | ICD-10-CM

## 2017-10-19 DIAGNOSIS — L97523 Non-pressure chronic ulcer of other part of left foot with necrosis of muscle: Secondary | ICD-10-CM | POA: Diagnosis not present

## 2017-10-19 NOTE — Progress Notes (Signed)
Office Visit Note   Patient: Cody Webster           Date of Birth: 1965/06/09           MRN: [id number] Visit Date: 10/19/2017              Requested by: Kerin Perna, NP [address] [place], [place] [address] PCP: Kerin Perna, NP  Chief Complaint  Patient presents with  . Left Foot - Follow-up, Pain, Wound Check, Open Wound      HPI: Patient is a 52 year old gentleman diabetic insensate neuropathy with Waggoner grade 1 ulcer beneath the fifth metatarsal head and left heel.  Patient feels like he is making improvement he states he is currently has lumbar spine pain and is following up with the [facility] for this.  Assessment & Plan: Visit Diagnoses:  1. 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  Follow-Up Instructions: Return in about 1 month (around 11/16/2017).   Ortho Exam  Patient is alert, oriented, no adenopathy, well-dressed, normal affect, normal respiratory effort. Examination patient has a palpable pulse he has good dorsiflexion of the ankle.  He has a Waggoner grade 1 ulcer to left heel and left fifth metatarsal head.  After informed consent a 10 blade knife was used to debride the skin and soft tissue back to healthy viable granulation tissue.  The fifth metatarsal head ulcer is 2 cm in diameter 2 mm deep in the heel ulcer is 3 cm in diameter and 2 mm deep.  A Band-Aid was applied.  Imaging: No results found. No images are attached to the encounter.  Labs: Lab Results  Component Value Date   HGBA1C 5.1 07/20/2017   HGBA1C 6.7 (H) 04/23/2016   HGBA1C 10.7 (H) 09/06/2014   ESRSEDRATE 25 (H) 12/10/2013   CRP <0.5 (L) 12/10/2013   REPTSTATUS 04/30/2016 FINAL 04/24/2016   REPTSTATUS 04/30/2016 FINAL 04/24/2016   CULT  04/24/2016    NO GROWTH 5 DAYS Performed at [facility]  04/24/2016     NO GROWTH 5 DAYS Performed at [facility]      Lab Results  Component Value Date   ALBUMIN 4.0 08/30/2017   ALBUMIN 3.4 (L) 07/20/2017   ALBUMIN 2.6 (L) 04/27/2016    There is no height or weight on file to calculate BMI.  Orders:  No orders of the defined types were placed in this encounter.  No orders of the defined types were placed in this encounter.    Procedures: No procedures performed  Clinical Data: No additional findings.  ROS:  All other systems negative, except as noted in the HPI. Review of Systems  Objective: Vital Signs: There were no vitals taken for this visit.  Specialty Comments:  No specialty comments available.  PMFS History: Patient Active Problem List   Diagnosis Date Noted  . Chest pain 07/20/2017  . Preop cardiovascular exam 11/04/2016  . Malnutrition of moderate degree ([place]) 01/12/2015  . DM type 2, uncontrolled, with neuropathy ([place]) 01/11/2015  . Glaucoma   . Eye pain   . Acute glaucoma of right eye 09/06/2014  . Acute renal failure superimposed on chronic kidney disease ([place]) 09/06/2014  . Anemia 09/06/2014  . Tobacco use disorder 09/06/2014  . Frequent headaches 07/11/2014  . CKD (chronic kidney disease), stage IV (  [place]) 07/11/2014  . Ataxia 07/11/2014  . CKD (chronic kidney disease) 12/11/2013  . Diabetic foot ulcer ([place]) 12/10/2013  . Essential hypertension 12/10/2013  . Peripheral neuropathy ([place]) 12/10/2013   Past Medical History:  Diagnosis Date  . Carotid artery occlusion   . CHF (congestive heart failure) ([place])   . 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 Diabetes mellitus without complication ([place])    type 2  . GERD (gastroesophageal reflux disease)   . Hypertension   . Peripheral neuropathy   . Pneumonia   . Seizures ([place])    27 years ago    Family History  Problem Relation Age of Onset  . CAD Mother   . Diabetes type II Father   . Hypertension Sister   . CAD Sister   . Diabetes type II  Brother     Past Surgical History:  Procedure Laterality Date  . AV FISTULA PLACEMENT Left 11/08/2016   Procedure: ARTERIOVENOUS (AV) FISTULA CREATION;  Surgeon: Angelia Mould, MD;  Location: [facility] OR;  Service: Vascular;  Laterality: Left;  . right foot surgery    . TOE AMPUTATION     Social History   Occupational History    Comment: Disabled - for Back pain & foot ulcer  Tobacco Use  . Smoking status: Current Every Day Smoker    Packs/day: 1.00    Years: 30.00    Pack years: 30.00    Types: Cigarettes, Cigars  . Smokeless tobacco: Former Systems developer    Types: Snuff, Sarina Ser    Quit date: 12/11/1983  Substance and Sexual Activity  . Alcohol use: Yes    Comment: ocassaionlly   . Drug use: No  . Sexual activity: Not Currently

## 2017-10-27 NOTE — Discharge Instructions (Signed)
Epoetin Alfa injection °What is this medicine? °EPOETIN ALFA (e POE e tin AL fa) helps your body make more red blood cells. This medicine is used to treat anemia caused by chronic kidney failure, cancer chemotherapy, or HIV-therapy. It may also be used before surgery if you have anemia. °This medicine may be used for other purposes; ask your health care provider or pharmacist if you have questions. °COMMON BRAND NAME(S): Epogen, Procrit °What should I tell my health care provider before I take this medicine? °They need to know if you have any of these conditions: °-blood clotting disorders °-cancer patient not on chemotherapy °-cystic fibrosis °-heart disease, such as angina or heart failure °-hemoglobin level of 12 g/dL or greater °-high blood pressure °-low levels of folate, iron, or vitamin B12 °-seizures °-an unusual or allergic reaction to erythropoietin, albumin, benzyl alcohol, hamster proteins, other medicines, foods, dyes, or preservatives °-pregnant or trying to get pregnant °-breast-feeding °How should I use this medicine? °This medicine is for injection into a vein or under the skin. It is usually given by a health care professional in a hospital or clinic setting. °If you get this medicine at home, you will be taught how to prepare and give this medicine. Use exactly as directed. Take your medicine at regular intervals. Do not take your medicine more often than directed. °It is important that you put your used needles and syringes in a special sharps container. Do not put them in a trash can. If you do not have a sharps container, call your pharmacist or healthcare provider to get one. °A special MedGuide will be given to you by the pharmacist with each prescription and refill. Be sure to read this information carefully each time. °Talk to your pediatrician regarding the use of this medicine in children. While this drug may be prescribed for selected conditions, precautions do apply. °Overdosage: If you  think you have taken too much of this medicine contact a poison control center or emergency room at once. °NOTE: This medicine is only for you. Do not share this medicine with others. °What if I miss a dose? °If you miss a dose, take it as soon as you can. If it is almost time for your next dose, take only that dose. Do not take double or extra doses. °What may interact with this medicine? °Do not take this medicine with any of the following medications: °-darbepoetin alfa °This list may not describe all possible interactions. Give your health care provider a list of all the medicines, herbs, non-prescription drugs, or dietary supplements you use. Also tell them if you smoke, drink alcohol, or use illegal drugs. Some items may interact with your medicine. °What should I watch for while using this medicine? °Your condition will be monitored carefully while you are receiving this medicine. °You may need blood work done while you are taking this medicine. °What side effects may I notice from receiving this medicine? °Side effects that you should report to your doctor or health care professional as soon as possible: °-allergic reactions like skin rash, itching or hives, swelling of the face, lips, or tongue °-breathing problems °-changes in vision °-chest pain °-confusion, trouble speaking or understanding °-feeling faint or lightheaded, falls °-high blood pressure °-muscle aches or pains °-pain, swelling, warmth in the leg °-rapid weight gain °-severe headaches °-sudden numbness or weakness of the face, arm or leg °-trouble walking, dizziness, loss of balance or coordination °-seizures (convulsions) °-swelling of the ankles, feet, hands °-unusually weak or tired °  Side effects that usually do not require medical attention (report to your doctor or health care professional if they continue or are bothersome): °-diarrhea °-fever, chills (flu-like symptoms) °-headaches °-nausea, vomiting °-redness, stinging, or swelling at  site where injected °This list may not describe all possible side effects. Call your doctor for medical advice about side effects. You may report side effects to FDA at 1-800-FDA-1088. °Where should I keep my medicine? °Keep out of the reach of children. °Store in a refrigerator between 2 and 8 degrees C (36 and 46 degrees F). Do not freeze or shake. Throw away any unused portion if using a single-dose vial. Multi-dose vials can be kept in the refrigerator for up to 21 days after the initial dose. Throw away unused medicine. °NOTE: This sheet is a summary. It may not cover all possible information. If you have questions about this medicine, talk to your doctor, pharmacist, or health care provider. °© 2018 Elsevier/Gold Standard (2015-12-21 19:42:31) ° °

## 2017-10-30 ENCOUNTER — Ambulatory Visit (HOSPITAL_COMMUNITY)
Admission: RE | Admit: 2017-10-30 | Discharge: 2017-10-30 | Disposition: A | Discharge status: Home / Self Care | Payer: Medicaid Other | Source: Ambulatory Visit | Attending: Nephrology | Admitting: Nephrology

## 2017-10-30 VITALS — BP 130/63 | HR 74 | Temp 98.7°F | Resp 18

## 2017-10-30 DIAGNOSIS — N170 Acute kidney failure with tubular necrosis: Secondary | ICD-10-CM

## 2017-10-30 DIAGNOSIS — N185 Chronic kidney disease, stage 5: Secondary | ICD-10-CM | POA: Diagnosis present

## 2017-10-30 DIAGNOSIS — D631 Anemia in chronic kidney disease: Secondary | ICD-10-CM | POA: Insufficient documentation

## 2017-10-30 LAB — IRON AND TIBC
Iron: 85 ug/dL (ref 45–182)
SATURATION RATIOS: 36 % (ref 17.9–39.5)
TIBC: 237 ug/dL — ABNORMAL LOW (ref 250–450)
UIBC: 152 ug/dL

## 2017-10-30 LAB — POCT HEMOGLOBIN-HEMACUE: Hemoglobin: 8.9 g/dL — ABNORMAL LOW (ref 13.0–17.0)

## 2017-10-30 LAB — FERRITIN: Ferritin: 198 ng/mL (ref 24–336)

## 2017-10-30 MED ORDER — EPOETIN ALFA 10000 UNIT/ML IJ SOLN
INTRAMUSCULAR | Status: AC
  Administered 2017-10-30: 10000 [IU] via SUBCUTANEOUS
  Filled 2017-10-30: qty 1

## 2017-10-30 MED ORDER — EPOETIN ALFA 10000 UNIT/ML IJ SOLN
10000.0000 [IU] | INTRAMUSCULAR | Status: DC
  Administered 2017-10-30: 10000 [IU] via SUBCUTANEOUS

## 2017-11-08 ENCOUNTER — Ambulatory Visit: Payer: Medicaid Other | Admitting: Cardiology

## 2017-11-09 DIAGNOSIS — E1165 Type 2 diabetes mellitus with hyperglycemia: Secondary | ICD-10-CM | POA: Diagnosis not present

## 2017-11-09 DIAGNOSIS — F4310 Post-traumatic stress disorder, unspecified: Secondary | ICD-10-CM | POA: Diagnosis not present

## 2017-11-09 DIAGNOSIS — M545 Low back pain: Secondary | ICD-10-CM | POA: Diagnosis not present

## 2017-11-09 DIAGNOSIS — N189 Chronic kidney disease, unspecified: Secondary | ICD-10-CM | POA: Diagnosis not present

## 2017-11-09 DIAGNOSIS — I10 Essential (primary) hypertension: Secondary | ICD-10-CM | POA: Diagnosis not present

## 2017-11-10 DIAGNOSIS — I10 Essential (primary) hypertension: Secondary | ICD-10-CM | POA: Diagnosis not present

## 2017-11-10 DIAGNOSIS — M545 Low back pain: Secondary | ICD-10-CM | POA: Diagnosis not present

## 2017-11-10 DIAGNOSIS — E1165 Type 2 diabetes mellitus with hyperglycemia: Secondary | ICD-10-CM | POA: Diagnosis not present

## 2017-11-10 DIAGNOSIS — F4310 Post-traumatic stress disorder, unspecified: Secondary | ICD-10-CM | POA: Diagnosis not present

## 2017-11-10 DIAGNOSIS — N189 Chronic kidney disease, unspecified: Secondary | ICD-10-CM | POA: Diagnosis not present

## 2017-11-11 DIAGNOSIS — F4310 Post-traumatic stress disorder, unspecified: Secondary | ICD-10-CM | POA: Diagnosis not present

## 2017-11-11 DIAGNOSIS — E1165 Type 2 diabetes mellitus with hyperglycemia: Secondary | ICD-10-CM | POA: Diagnosis not present

## 2017-11-11 DIAGNOSIS — N189 Chronic kidney disease, unspecified: Secondary | ICD-10-CM | POA: Diagnosis not present

## 2017-11-11 DIAGNOSIS — I10 Essential (primary) hypertension: Secondary | ICD-10-CM | POA: Diagnosis not present

## 2017-11-11 DIAGNOSIS — M545 Low back pain: Secondary | ICD-10-CM | POA: Diagnosis not present

## 2017-11-12 DIAGNOSIS — N189 Chronic kidney disease, unspecified: Secondary | ICD-10-CM | POA: Diagnosis not present

## 2017-11-12 DIAGNOSIS — F4310 Post-traumatic stress disorder, unspecified: Secondary | ICD-10-CM | POA: Diagnosis not present

## 2017-11-12 DIAGNOSIS — M545 Low back pain: Secondary | ICD-10-CM | POA: Diagnosis not present

## 2017-11-12 DIAGNOSIS — E1165 Type 2 diabetes mellitus with hyperglycemia: Secondary | ICD-10-CM | POA: Diagnosis not present

## 2017-11-12 DIAGNOSIS — I10 Essential (primary) hypertension: Secondary | ICD-10-CM | POA: Diagnosis not present

## 2017-11-13 ENCOUNTER — Encounter (HOSPITAL_COMMUNITY)
Admission: RE | Admit: 2017-11-13 | Discharge: 2017-11-13 | Disposition: A | Discharge status: Home / Self Care | Payer: Medicaid Other | Source: Ambulatory Visit | Attending: Nephrology | Admitting: Nephrology

## 2017-11-13 VITALS — BP 138/71 | HR 61 | Temp 98.0°F | Ht 73.0 in | Wt 190.0 lb

## 2017-11-13 DIAGNOSIS — M545 Low back pain: Secondary | ICD-10-CM | POA: Diagnosis not present

## 2017-11-13 DIAGNOSIS — N189 Chronic kidney disease, unspecified: Secondary | ICD-10-CM | POA: Diagnosis not present

## 2017-11-13 DIAGNOSIS — E1165 Type 2 diabetes mellitus with hyperglycemia: Secondary | ICD-10-CM | POA: Diagnosis not present

## 2017-11-13 DIAGNOSIS — N185 Chronic kidney disease, stage 5: Secondary | ICD-10-CM | POA: Insufficient documentation

## 2017-11-13 DIAGNOSIS — I10 Essential (primary) hypertension: Secondary | ICD-10-CM | POA: Diagnosis not present

## 2017-11-13 DIAGNOSIS — Z79899 Other long term (current) drug therapy: Secondary | ICD-10-CM | POA: Diagnosis not present

## 2017-11-13 DIAGNOSIS — D631 Anemia in chronic kidney disease: Secondary | ICD-10-CM | POA: Diagnosis not present

## 2017-11-13 DIAGNOSIS — N170 Acute kidney failure with tubular necrosis: Secondary | ICD-10-CM

## 2017-11-13 DIAGNOSIS — F4310 Post-traumatic stress disorder, unspecified: Secondary | ICD-10-CM | POA: Diagnosis not present

## 2017-11-13 DIAGNOSIS — Z5181 Encounter for therapeutic drug level monitoring: Secondary | ICD-10-CM | POA: Insufficient documentation

## 2017-11-13 LAB — POCT HEMOGLOBIN-HEMACUE: Hemoglobin: 10.1 g/dL — ABNORMAL LOW (ref 13.0–17.0)

## 2017-11-13 MED ORDER — EPOETIN ALFA 10000 UNIT/ML IJ SOLN: 10000.0000 [IU] | INTRAMUSCULAR | Status: DC

## 2017-11-13 MED ORDER — EPOETIN ALFA 10000 UNIT/ML IJ SOLN
INTRAMUSCULAR | Status: AC
  Administered 2017-11-13: 10000 [IU]
  Filled 2017-11-13: qty 1

## 2017-11-14 DIAGNOSIS — N189 Chronic kidney disease, unspecified: Secondary | ICD-10-CM | POA: Diagnosis not present

## 2017-11-14 DIAGNOSIS — I10 Essential (primary) hypertension: Secondary | ICD-10-CM | POA: Diagnosis not present

## 2017-11-14 DIAGNOSIS — F4310 Post-traumatic stress disorder, unspecified: Secondary | ICD-10-CM | POA: Diagnosis not present

## 2017-11-14 DIAGNOSIS — M545 Low back pain: Secondary | ICD-10-CM | POA: Diagnosis not present

## 2017-11-14 DIAGNOSIS — E1165 Type 2 diabetes mellitus with hyperglycemia: Secondary | ICD-10-CM | POA: Diagnosis not present

## 2017-11-15 DIAGNOSIS — M545 Low back pain: Secondary | ICD-10-CM | POA: Diagnosis not present

## 2017-11-15 DIAGNOSIS — F4310 Post-traumatic stress disorder, unspecified: Secondary | ICD-10-CM | POA: Diagnosis not present

## 2017-11-15 DIAGNOSIS — E1165 Type 2 diabetes mellitus with hyperglycemia: Secondary | ICD-10-CM | POA: Diagnosis not present

## 2017-11-15 DIAGNOSIS — N189 Chronic kidney disease, unspecified: Secondary | ICD-10-CM | POA: Diagnosis not present

## 2017-11-15 DIAGNOSIS — I10 Essential (primary) hypertension: Secondary | ICD-10-CM | POA: Diagnosis not present

## 2017-11-17 DIAGNOSIS — N189 Chronic kidney disease, unspecified: Secondary | ICD-10-CM | POA: Diagnosis not present

## 2017-11-17 DIAGNOSIS — I10 Essential (primary) hypertension: Secondary | ICD-10-CM | POA: Diagnosis not present

## 2017-11-17 DIAGNOSIS — M545 Low back pain: Secondary | ICD-10-CM | POA: Diagnosis not present

## 2017-11-17 DIAGNOSIS — F4310 Post-traumatic stress disorder, unspecified: Secondary | ICD-10-CM | POA: Diagnosis not present

## 2017-11-17 DIAGNOSIS — E1165 Type 2 diabetes mellitus with hyperglycemia: Secondary | ICD-10-CM | POA: Diagnosis not present

## 2017-11-18 DIAGNOSIS — M545 Low back pain: Secondary | ICD-10-CM | POA: Diagnosis not present

## 2017-11-18 DIAGNOSIS — E1165 Type 2 diabetes mellitus with hyperglycemia: Secondary | ICD-10-CM | POA: Diagnosis not present

## 2017-11-18 DIAGNOSIS — F4310 Post-traumatic stress disorder, unspecified: Secondary | ICD-10-CM | POA: Diagnosis not present

## 2017-11-18 DIAGNOSIS — N189 Chronic kidney disease, unspecified: Secondary | ICD-10-CM | POA: Diagnosis not present

## 2017-11-18 DIAGNOSIS — I10 Essential (primary) hypertension: Secondary | ICD-10-CM | POA: Diagnosis not present

## 2017-11-19 DIAGNOSIS — F4310 Post-traumatic stress disorder, unspecified: Secondary | ICD-10-CM | POA: Diagnosis not present

## 2017-11-19 DIAGNOSIS — E1165 Type 2 diabetes mellitus with hyperglycemia: Secondary | ICD-10-CM | POA: Diagnosis not present

## 2017-11-19 DIAGNOSIS — N189 Chronic kidney disease, unspecified: Secondary | ICD-10-CM | POA: Diagnosis not present

## 2017-11-19 DIAGNOSIS — I10 Essential (primary) hypertension: Secondary | ICD-10-CM | POA: Diagnosis not present

## 2017-11-19 DIAGNOSIS — M545 Low back pain: Secondary | ICD-10-CM | POA: Diagnosis not present

## 2017-11-20 ENCOUNTER — Ambulatory Visit (INDEPENDENT_AMBULATORY_CARE_PROVIDER_SITE_OTHER): Payer: Medicaid Other | Admitting: Orthopedic Surgery

## 2017-11-20 DIAGNOSIS — I10 Essential (primary) hypertension: Secondary | ICD-10-CM | POA: Diagnosis not present

## 2017-11-20 DIAGNOSIS — M545 Low back pain: Secondary | ICD-10-CM | POA: Diagnosis not present

## 2017-11-20 DIAGNOSIS — N189 Chronic kidney disease, unspecified: Secondary | ICD-10-CM | POA: Diagnosis not present

## 2017-11-20 DIAGNOSIS — F4310 Post-traumatic stress disorder, unspecified: Secondary | ICD-10-CM | POA: Diagnosis not present

## 2017-11-20 DIAGNOSIS — E1165 Type 2 diabetes mellitus with hyperglycemia: Secondary | ICD-10-CM | POA: Diagnosis not present

## 2017-11-21 DIAGNOSIS — I10 Essential (primary) hypertension: Secondary | ICD-10-CM | POA: Diagnosis not present

## 2017-11-21 DIAGNOSIS — N189 Chronic kidney disease, unspecified: Secondary | ICD-10-CM | POA: Diagnosis not present

## 2017-11-21 DIAGNOSIS — E1165 Type 2 diabetes mellitus with hyperglycemia: Secondary | ICD-10-CM | POA: Diagnosis not present

## 2017-11-21 DIAGNOSIS — M545 Low back pain: Secondary | ICD-10-CM | POA: Diagnosis not present

## 2017-11-21 DIAGNOSIS — F4310 Post-traumatic stress disorder, unspecified: Secondary | ICD-10-CM | POA: Diagnosis not present

## 2017-11-22 DIAGNOSIS — I10 Essential (primary) hypertension: Secondary | ICD-10-CM | POA: Diagnosis not present

## 2017-11-22 DIAGNOSIS — N189 Chronic kidney disease, unspecified: Secondary | ICD-10-CM | POA: Diagnosis not present

## 2017-11-22 DIAGNOSIS — E1165 Type 2 diabetes mellitus with hyperglycemia: Secondary | ICD-10-CM | POA: Diagnosis not present

## 2017-11-22 DIAGNOSIS — M545 Low back pain: Secondary | ICD-10-CM | POA: Diagnosis not present

## 2017-11-22 DIAGNOSIS — F4310 Post-traumatic stress disorder, unspecified: Secondary | ICD-10-CM | POA: Diagnosis not present

## 2017-11-23 DIAGNOSIS — F4310 Post-traumatic stress disorder, unspecified: Secondary | ICD-10-CM | POA: Diagnosis not present

## 2017-11-23 DIAGNOSIS — I10 Essential (primary) hypertension: Secondary | ICD-10-CM | POA: Diagnosis not present

## 2017-11-23 DIAGNOSIS — N189 Chronic kidney disease, unspecified: Secondary | ICD-10-CM | POA: Diagnosis not present

## 2017-11-23 DIAGNOSIS — E1165 Type 2 diabetes mellitus with hyperglycemia: Secondary | ICD-10-CM | POA: Diagnosis not present

## 2017-11-23 DIAGNOSIS — M545 Low back pain: Secondary | ICD-10-CM | POA: Diagnosis not present

## 2017-11-24 DIAGNOSIS — M545 Low back pain: Secondary | ICD-10-CM | POA: Diagnosis not present

## 2017-11-24 DIAGNOSIS — I10 Essential (primary) hypertension: Secondary | ICD-10-CM | POA: Diagnosis not present

## 2017-11-24 DIAGNOSIS — N189 Chronic kidney disease, unspecified: Secondary | ICD-10-CM | POA: Diagnosis not present

## 2017-11-24 DIAGNOSIS — E1165 Type 2 diabetes mellitus with hyperglycemia: Secondary | ICD-10-CM | POA: Diagnosis not present

## 2017-11-24 DIAGNOSIS — F4310 Post-traumatic stress disorder, unspecified: Secondary | ICD-10-CM | POA: Diagnosis not present

## 2017-11-25 DIAGNOSIS — N189 Chronic kidney disease, unspecified: Secondary | ICD-10-CM | POA: Diagnosis not present

## 2017-11-25 DIAGNOSIS — F4310 Post-traumatic stress disorder, unspecified: Secondary | ICD-10-CM | POA: Diagnosis not present

## 2017-11-25 DIAGNOSIS — E1165 Type 2 diabetes mellitus with hyperglycemia: Secondary | ICD-10-CM | POA: Diagnosis not present

## 2017-11-25 DIAGNOSIS — I10 Essential (primary) hypertension: Secondary | ICD-10-CM | POA: Diagnosis not present

## 2017-11-25 DIAGNOSIS — M545 Low back pain: Secondary | ICD-10-CM | POA: Diagnosis not present

## 2017-11-26 DIAGNOSIS — I10 Essential (primary) hypertension: Secondary | ICD-10-CM | POA: Diagnosis not present

## 2017-11-26 DIAGNOSIS — F4310 Post-traumatic stress disorder, unspecified: Secondary | ICD-10-CM | POA: Diagnosis not present

## 2017-11-26 DIAGNOSIS — M545 Low back pain: Secondary | ICD-10-CM | POA: Diagnosis not present

## 2017-11-26 DIAGNOSIS — N189 Chronic kidney disease, unspecified: Secondary | ICD-10-CM | POA: Diagnosis not present

## 2017-11-26 DIAGNOSIS — E1165 Type 2 diabetes mellitus with hyperglycemia: Secondary | ICD-10-CM | POA: Diagnosis not present

## 2017-11-27 ENCOUNTER — Ambulatory Visit (HOSPITAL_COMMUNITY)
Admission: RE | Admit: 2017-11-27 | Discharge: 2017-11-27 | Disposition: A | Discharge status: Home / Self Care | Payer: Medicaid Other | Source: Ambulatory Visit | Attending: Nephrology | Admitting: Nephrology

## 2017-11-27 VITALS — BP 127/68 | HR 67 | Temp 98.0°F

## 2017-11-27 DIAGNOSIS — N185 Chronic kidney disease, stage 5: Secondary | ICD-10-CM | POA: Diagnosis not present

## 2017-11-27 DIAGNOSIS — D631 Anemia in chronic kidney disease: Secondary | ICD-10-CM | POA: Diagnosis not present

## 2017-11-27 DIAGNOSIS — E1165 Type 2 diabetes mellitus with hyperglycemia: Secondary | ICD-10-CM | POA: Diagnosis not present

## 2017-11-27 DIAGNOSIS — I10 Essential (primary) hypertension: Secondary | ICD-10-CM | POA: Diagnosis not present

## 2017-11-27 DIAGNOSIS — N170 Acute kidney failure with tubular necrosis: Secondary | ICD-10-CM

## 2017-11-27 DIAGNOSIS — F4310 Post-traumatic stress disorder, unspecified: Secondary | ICD-10-CM | POA: Diagnosis not present

## 2017-11-27 DIAGNOSIS — M545 Low back pain: Secondary | ICD-10-CM | POA: Diagnosis not present

## 2017-11-27 DIAGNOSIS — N189 Chronic kidney disease, unspecified: Secondary | ICD-10-CM | POA: Diagnosis not present

## 2017-11-27 LAB — FERRITIN: Ferritin: 217 ng/mL (ref 24–336)

## 2017-11-27 LAB — IRON AND TIBC
Iron: 51 ug/dL (ref 45–182)
SATURATION RATIOS: 21 % (ref 17.9–39.5)
TIBC: 246 ug/dL — AB (ref 250–450)
UIBC: 195 ug/dL

## 2017-11-27 LAB — POCT HEMOGLOBIN-HEMACUE: Hemoglobin: 10.8 g/dL — ABNORMAL LOW (ref 13.0–17.0)

## 2017-11-27 MED ORDER — EPOETIN ALFA 10000 UNIT/ML IJ SOLN
10000.0000 [IU] | INTRAMUSCULAR | Status: DC
  Administered 2017-11-27: 10000 [IU] via SUBCUTANEOUS

## 2017-11-27 MED ORDER — EPOETIN ALFA 10000 UNIT/ML IJ SOLN
INTRAMUSCULAR | Status: AC
  Filled 2017-11-27: qty 1

## 2017-11-28 DIAGNOSIS — I10 Essential (primary) hypertension: Secondary | ICD-10-CM | POA: Diagnosis not present

## 2017-11-28 DIAGNOSIS — F4310 Post-traumatic stress disorder, unspecified: Secondary | ICD-10-CM | POA: Diagnosis not present

## 2017-11-28 DIAGNOSIS — M545 Low back pain: Secondary | ICD-10-CM | POA: Diagnosis not present

## 2017-11-28 DIAGNOSIS — N189 Chronic kidney disease, unspecified: Secondary | ICD-10-CM | POA: Diagnosis not present

## 2017-11-28 DIAGNOSIS — E1165 Type 2 diabetes mellitus with hyperglycemia: Secondary | ICD-10-CM | POA: Diagnosis not present

## 2017-11-29 DIAGNOSIS — I10 Essential (primary) hypertension: Secondary | ICD-10-CM | POA: Diagnosis not present

## 2017-11-29 DIAGNOSIS — M545 Low back pain: Secondary | ICD-10-CM | POA: Diagnosis not present

## 2017-11-29 DIAGNOSIS — E1165 Type 2 diabetes mellitus with hyperglycemia: Secondary | ICD-10-CM | POA: Diagnosis not present

## 2017-11-29 DIAGNOSIS — N189 Chronic kidney disease, unspecified: Secondary | ICD-10-CM | POA: Diagnosis not present

## 2017-11-29 DIAGNOSIS — F4310 Post-traumatic stress disorder, unspecified: Secondary | ICD-10-CM | POA: Diagnosis not present

## 2017-11-30 DIAGNOSIS — E1165 Type 2 diabetes mellitus with hyperglycemia: Secondary | ICD-10-CM | POA: Diagnosis not present

## 2017-11-30 DIAGNOSIS — N189 Chronic kidney disease, unspecified: Secondary | ICD-10-CM | POA: Diagnosis not present

## 2017-11-30 DIAGNOSIS — M545 Low back pain: Secondary | ICD-10-CM | POA: Diagnosis not present

## 2017-11-30 DIAGNOSIS — I10 Essential (primary) hypertension: Secondary | ICD-10-CM | POA: Diagnosis not present

## 2017-11-30 DIAGNOSIS — F4310 Post-traumatic stress disorder, unspecified: Secondary | ICD-10-CM | POA: Diagnosis not present

## 2017-12-01 DIAGNOSIS — I10 Essential (primary) hypertension: Secondary | ICD-10-CM | POA: Diagnosis not present

## 2017-12-01 DIAGNOSIS — M545 Low back pain: Secondary | ICD-10-CM | POA: Diagnosis not present

## 2017-12-01 DIAGNOSIS — N189 Chronic kidney disease, unspecified: Secondary | ICD-10-CM | POA: Diagnosis not present

## 2017-12-01 DIAGNOSIS — F4310 Post-traumatic stress disorder, unspecified: Secondary | ICD-10-CM | POA: Diagnosis not present

## 2017-12-01 DIAGNOSIS — E1165 Type 2 diabetes mellitus with hyperglycemia: Secondary | ICD-10-CM | POA: Diagnosis not present

## 2017-12-02 DIAGNOSIS — E1165 Type 2 diabetes mellitus with hyperglycemia: Secondary | ICD-10-CM | POA: Diagnosis not present

## 2017-12-02 DIAGNOSIS — N189 Chronic kidney disease, unspecified: Secondary | ICD-10-CM | POA: Diagnosis not present

## 2017-12-02 DIAGNOSIS — M545 Low back pain: Secondary | ICD-10-CM | POA: Diagnosis not present

## 2017-12-02 DIAGNOSIS — I10 Essential (primary) hypertension: Secondary | ICD-10-CM | POA: Diagnosis not present

## 2017-12-02 DIAGNOSIS — F4310 Post-traumatic stress disorder, unspecified: Secondary | ICD-10-CM | POA: Diagnosis not present

## 2017-12-03 DIAGNOSIS — F4310 Post-traumatic stress disorder, unspecified: Secondary | ICD-10-CM | POA: Diagnosis not present

## 2017-12-03 DIAGNOSIS — M545 Low back pain: Secondary | ICD-10-CM | POA: Diagnosis not present

## 2017-12-03 DIAGNOSIS — I10 Essential (primary) hypertension: Secondary | ICD-10-CM | POA: Diagnosis not present

## 2017-12-03 DIAGNOSIS — N189 Chronic kidney disease, unspecified: Secondary | ICD-10-CM | POA: Diagnosis not present

## 2017-12-03 DIAGNOSIS — E1165 Type 2 diabetes mellitus with hyperglycemia: Secondary | ICD-10-CM | POA: Diagnosis not present

## 2017-12-04 DIAGNOSIS — E1165 Type 2 diabetes mellitus with hyperglycemia: Secondary | ICD-10-CM | POA: Diagnosis not present

## 2017-12-04 DIAGNOSIS — N189 Chronic kidney disease, unspecified: Secondary | ICD-10-CM | POA: Diagnosis not present

## 2017-12-04 DIAGNOSIS — N185 Chronic kidney disease, stage 5: Secondary | ICD-10-CM | POA: Diagnosis not present

## 2017-12-04 DIAGNOSIS — I10 Essential (primary) hypertension: Secondary | ICD-10-CM | POA: Diagnosis not present

## 2017-12-04 DIAGNOSIS — F4310 Post-traumatic stress disorder, unspecified: Secondary | ICD-10-CM | POA: Diagnosis not present

## 2017-12-04 DIAGNOSIS — M545 Low back pain: Secondary | ICD-10-CM | POA: Diagnosis not present

## 2017-12-05 DIAGNOSIS — M545 Low back pain: Secondary | ICD-10-CM | POA: Diagnosis not present

## 2017-12-05 DIAGNOSIS — I10 Essential (primary) hypertension: Secondary | ICD-10-CM | POA: Diagnosis not present

## 2017-12-05 DIAGNOSIS — F4310 Post-traumatic stress disorder, unspecified: Secondary | ICD-10-CM | POA: Diagnosis not present

## 2017-12-05 DIAGNOSIS — E1165 Type 2 diabetes mellitus with hyperglycemia: Secondary | ICD-10-CM | POA: Diagnosis not present

## 2017-12-05 DIAGNOSIS — N189 Chronic kidney disease, unspecified: Secondary | ICD-10-CM | POA: Diagnosis not present

## 2017-12-06 DIAGNOSIS — Z7689 Persons encountering health services in other specified circumstances: Secondary | ICD-10-CM | POA: Diagnosis not present

## 2017-12-06 DIAGNOSIS — E1165 Type 2 diabetes mellitus with hyperglycemia: Secondary | ICD-10-CM | POA: Diagnosis not present

## 2017-12-06 DIAGNOSIS — Z79891 Long term (current) use of opiate analgesic: Secondary | ICD-10-CM | POA: Diagnosis not present

## 2017-12-06 DIAGNOSIS — M25572 Pain in left ankle and joints of left foot: Secondary | ICD-10-CM | POA: Diagnosis not present

## 2017-12-06 DIAGNOSIS — M25571 Pain in right ankle and joints of right foot: Secondary | ICD-10-CM | POA: Diagnosis not present

## 2017-12-06 DIAGNOSIS — I10 Essential (primary) hypertension: Secondary | ICD-10-CM | POA: Diagnosis not present

## 2017-12-06 DIAGNOSIS — F4310 Post-traumatic stress disorder, unspecified: Secondary | ICD-10-CM | POA: Diagnosis not present

## 2017-12-06 DIAGNOSIS — M545 Low back pain: Secondary | ICD-10-CM | POA: Diagnosis not present

## 2017-12-06 DIAGNOSIS — N189 Chronic kidney disease, unspecified: Secondary | ICD-10-CM | POA: Diagnosis not present

## 2017-12-06 DIAGNOSIS — M79604 Pain in right leg: Secondary | ICD-10-CM | POA: Diagnosis not present

## 2017-12-06 DIAGNOSIS — G894 Chronic pain syndrome: Secondary | ICD-10-CM | POA: Diagnosis not present

## 2017-12-06 DIAGNOSIS — M79605 Pain in left leg: Secondary | ICD-10-CM | POA: Diagnosis not present

## 2017-12-07 DIAGNOSIS — M545 Low back pain: Secondary | ICD-10-CM | POA: Diagnosis not present

## 2017-12-07 DIAGNOSIS — N189 Chronic kidney disease, unspecified: Secondary | ICD-10-CM | POA: Diagnosis not present

## 2017-12-07 DIAGNOSIS — L97523 Non-pressure chronic ulcer of other part of left foot with necrosis of muscle: Secondary | ICD-10-CM | POA: Diagnosis not present

## 2017-12-07 DIAGNOSIS — I10 Essential (primary) hypertension: Secondary | ICD-10-CM | POA: Diagnosis not present

## 2017-12-07 DIAGNOSIS — E10621 Type 1 diabetes mellitus with foot ulcer: Secondary | ICD-10-CM | POA: Diagnosis not present

## 2017-12-07 DIAGNOSIS — F4310 Post-traumatic stress disorder, unspecified: Secondary | ICD-10-CM | POA: Diagnosis not present

## 2017-12-07 DIAGNOSIS — E1165 Type 2 diabetes mellitus with hyperglycemia: Secondary | ICD-10-CM | POA: Diagnosis not present

## 2017-12-08 ENCOUNTER — Other Ambulatory Visit (HOSPITAL_COMMUNITY): Payer: Self-pay

## 2017-12-08 DIAGNOSIS — N189 Chronic kidney disease, unspecified: Secondary | ICD-10-CM | POA: Diagnosis not present

## 2017-12-08 DIAGNOSIS — F4310 Post-traumatic stress disorder, unspecified: Secondary | ICD-10-CM | POA: Diagnosis not present

## 2017-12-08 DIAGNOSIS — M545 Low back pain: Secondary | ICD-10-CM | POA: Diagnosis not present

## 2017-12-08 DIAGNOSIS — E1165 Type 2 diabetes mellitus with hyperglycemia: Secondary | ICD-10-CM | POA: Diagnosis not present

## 2017-12-08 DIAGNOSIS — I10 Essential (primary) hypertension: Secondary | ICD-10-CM | POA: Diagnosis not present

## 2017-12-11 ENCOUNTER — Encounter (HOSPITAL_COMMUNITY): Payer: Medicaid Other

## 2017-12-11 DIAGNOSIS — I10 Essential (primary) hypertension: Secondary | ICD-10-CM | POA: Diagnosis not present

## 2017-12-11 DIAGNOSIS — M545 Low back pain: Secondary | ICD-10-CM | POA: Diagnosis not present

## 2017-12-11 DIAGNOSIS — F4310 Post-traumatic stress disorder, unspecified: Secondary | ICD-10-CM | POA: Diagnosis not present

## 2017-12-11 DIAGNOSIS — N189 Chronic kidney disease, unspecified: Secondary | ICD-10-CM | POA: Diagnosis not present

## 2017-12-11 DIAGNOSIS — E1165 Type 2 diabetes mellitus with hyperglycemia: Secondary | ICD-10-CM | POA: Diagnosis not present

## 2017-12-12 DIAGNOSIS — F4310 Post-traumatic stress disorder, unspecified: Secondary | ICD-10-CM | POA: Diagnosis not present

## 2017-12-12 DIAGNOSIS — E1165 Type 2 diabetes mellitus with hyperglycemia: Secondary | ICD-10-CM | POA: Diagnosis not present

## 2017-12-12 DIAGNOSIS — I10 Essential (primary) hypertension: Secondary | ICD-10-CM | POA: Diagnosis not present

## 2017-12-12 DIAGNOSIS — M545 Low back pain: Secondary | ICD-10-CM | POA: Diagnosis not present

## 2017-12-12 DIAGNOSIS — N189 Chronic kidney disease, unspecified: Secondary | ICD-10-CM | POA: Diagnosis not present

## 2017-12-13 ENCOUNTER — Other Ambulatory Visit: Payer: Self-pay | Admitting: Physician Assistant

## 2017-12-14 ENCOUNTER — Ambulatory Visit: Payer: Medicaid Other | Admitting: Cardiology

## 2017-12-14 NOTE — Telephone Encounter (Signed)
Rx sent to pharmacy   

## 2017-12-18 ENCOUNTER — Encounter (HOSPITAL_COMMUNITY): Payer: Medicaid Other

## 2017-12-19 ENCOUNTER — Other Ambulatory Visit (HOSPITAL_COMMUNITY): Payer: Self-pay | Admitting: *Deleted

## 2017-12-20 ENCOUNTER — Ambulatory Visit (HOSPITAL_COMMUNITY)
Admission: RE | Admit: 2017-12-20 | Discharge: 2017-12-20 | Disposition: A | Discharge status: Home / Self Care | Payer: Medicaid Other | Source: Ambulatory Visit | Attending: Nephrology | Admitting: Nephrology

## 2017-12-20 VITALS — BP 168/70 | HR 65 | Temp 98.5°F | Resp 18 | Ht 73.0 in | Wt 190.0 lb

## 2017-12-20 DIAGNOSIS — N185 Chronic kidney disease, stage 5: Secondary | ICD-10-CM | POA: Insufficient documentation

## 2017-12-20 DIAGNOSIS — D631 Anemia in chronic kidney disease: Secondary | ICD-10-CM | POA: Diagnosis not present

## 2017-12-20 DIAGNOSIS — N170 Acute kidney failure with tubular necrosis: Secondary | ICD-10-CM | POA: Diagnosis not present

## 2017-12-20 LAB — POCT HEMOGLOBIN-HEMACUE: Hemoglobin: 9.9 g/dL — ABNORMAL LOW (ref 13.0–17.0)

## 2017-12-20 MED ORDER — EPOETIN ALFA 10000 UNIT/ML IJ SOLN
INTRAMUSCULAR | Status: AC
  Administered 2017-12-20: 10000 [IU] via SUBCUTANEOUS
  Filled 2017-12-20: qty 1

## 2017-12-20 MED ORDER — EPOETIN ALFA 10000 UNIT/ML IJ SOLN
10000.0000 [IU] | INTRAMUSCULAR | Status: DC
  Administered 2017-12-20: 10000 [IU] via SUBCUTANEOUS

## 2017-12-20 MED ORDER — SODIUM CHLORIDE 0.9 % IV SOLN
510.0000 mg | INTRAVENOUS | Status: DC
  Administered 2017-12-20: 510 mg via INTRAVENOUS
  Filled 2017-12-20: qty 17

## 2017-12-20 NOTE — Progress Notes (Signed)
Called [facility] and spoke with F[place].  Pt has moved to [facility] and has feraheme and injection scheduled for today, feraheme for next week, and injection in 2 weeks.  I asked Stacie if we could wait and do the second dose of feraheme in 2 weeks with his injection since he has moved to [place] and she stated she is good with that.

## 2017-12-25 ENCOUNTER — Encounter (HOSPITAL_COMMUNITY): Payer: Medicaid Other

## 2018-01-01 DIAGNOSIS — G894 Chronic pain syndrome: Secondary | ICD-10-CM | POA: Diagnosis not present

## 2018-01-01 DIAGNOSIS — M79672 Pain in left foot: Secondary | ICD-10-CM | POA: Diagnosis not present

## 2018-01-01 DIAGNOSIS — M25571 Pain in right ankle and joints of right foot: Secondary | ICD-10-CM | POA: Diagnosis not present

## 2018-01-01 DIAGNOSIS — M545 Low back pain: Secondary | ICD-10-CM | POA: Diagnosis not present

## 2018-01-01 DIAGNOSIS — M79604 Pain in right leg: Secondary | ICD-10-CM | POA: Diagnosis not present

## 2018-01-01 DIAGNOSIS — R208 Other disturbances of skin sensation: Secondary | ICD-10-CM | POA: Diagnosis not present

## 2018-01-01 DIAGNOSIS — M79671 Pain in right foot: Secondary | ICD-10-CM | POA: Diagnosis not present

## 2018-01-01 DIAGNOSIS — M25572 Pain in left ankle and joints of left foot: Secondary | ICD-10-CM | POA: Diagnosis not present

## 2018-01-01 DIAGNOSIS — M79605 Pain in left leg: Secondary | ICD-10-CM | POA: Diagnosis not present

## 2018-01-03 ENCOUNTER — Ambulatory Visit (HOSPITAL_COMMUNITY)
Admission: RE | Admit: 2018-01-03 | Discharge: 2018-01-03 | Disposition: A | Discharge status: Home / Self Care | Payer: Medicaid Other | Source: Ambulatory Visit | Attending: Nephrology | Admitting: Nephrology

## 2018-01-03 VITALS — BP 137/70 | HR 63 | Temp 98.2°F | Ht 73.0 in | Wt 190.0 lb

## 2018-01-03 DIAGNOSIS — D631 Anemia in chronic kidney disease: Secondary | ICD-10-CM | POA: Diagnosis not present

## 2018-01-03 DIAGNOSIS — N185 Chronic kidney disease, stage 5: Secondary | ICD-10-CM | POA: Diagnosis not present

## 2018-01-03 DIAGNOSIS — N170 Acute kidney failure with tubular necrosis: Secondary | ICD-10-CM

## 2018-01-03 LAB — POCT HEMOGLOBIN-HEMACUE: HEMOGLOBIN: 10.8 g/dL — AB (ref 13.0–17.0)

## 2018-01-03 MED ORDER — SODIUM CHLORIDE 0.9 % IV SOLN
510.0000 mg | INTRAVENOUS | Status: DC
  Administered 2018-01-03: 510 mg via INTRAVENOUS
  Filled 2018-01-03: qty 17

## 2018-01-03 MED ORDER — EPOETIN ALFA 10000 UNIT/ML IJ SOLN
INTRAMUSCULAR | Status: AC
  Administered 2018-01-03: 10000 [IU] via SUBCUTANEOUS
  Filled 2018-01-03: qty 1

## 2018-01-03 MED ORDER — EPOETIN ALFA 10000 UNIT/ML IJ SOLN
10000.0000 [IU] | INTRAMUSCULAR | Status: DC
  Administered 2018-01-03: 10000 [IU] via SUBCUTANEOUS

## 2018-01-08 DIAGNOSIS — N189 Chronic kidney disease, unspecified: Secondary | ICD-10-CM | POA: Diagnosis not present

## 2018-01-08 DIAGNOSIS — I10 Essential (primary) hypertension: Secondary | ICD-10-CM | POA: Diagnosis not present

## 2018-01-08 DIAGNOSIS — M545 Low back pain: Secondary | ICD-10-CM | POA: Diagnosis not present

## 2018-01-08 DIAGNOSIS — F4310 Post-traumatic stress disorder, unspecified: Secondary | ICD-10-CM | POA: Diagnosis not present

## 2018-01-08 DIAGNOSIS — E1165 Type 2 diabetes mellitus with hyperglycemia: Secondary | ICD-10-CM | POA: Diagnosis not present

## 2018-01-09 DIAGNOSIS — M545 Low back pain: Secondary | ICD-10-CM | POA: Diagnosis not present

## 2018-01-09 DIAGNOSIS — F4310 Post-traumatic stress disorder, unspecified: Secondary | ICD-10-CM | POA: Diagnosis not present

## 2018-01-09 DIAGNOSIS — I10 Essential (primary) hypertension: Secondary | ICD-10-CM | POA: Diagnosis not present

## 2018-01-09 DIAGNOSIS — N189 Chronic kidney disease, unspecified: Secondary | ICD-10-CM | POA: Diagnosis not present

## 2018-01-09 DIAGNOSIS — E1165 Type 2 diabetes mellitus with hyperglycemia: Secondary | ICD-10-CM | POA: Diagnosis not present

## 2018-01-10 DIAGNOSIS — F4310 Post-traumatic stress disorder, unspecified: Secondary | ICD-10-CM | POA: Diagnosis not present

## 2018-01-10 DIAGNOSIS — N189 Chronic kidney disease, unspecified: Secondary | ICD-10-CM | POA: Diagnosis not present

## 2018-01-10 DIAGNOSIS — M545 Low back pain: Secondary | ICD-10-CM | POA: Diagnosis not present

## 2018-01-10 DIAGNOSIS — E1165 Type 2 diabetes mellitus with hyperglycemia: Secondary | ICD-10-CM | POA: Diagnosis not present

## 2018-01-10 DIAGNOSIS — I10 Essential (primary) hypertension: Secondary | ICD-10-CM | POA: Diagnosis not present

## 2018-01-11 ENCOUNTER — Other Ambulatory Visit (INDEPENDENT_AMBULATORY_CARE_PROVIDER_SITE_OTHER): Payer: Self-pay | Admitting: Orthopedic Surgery

## 2018-01-11 DIAGNOSIS — I10 Essential (primary) hypertension: Secondary | ICD-10-CM | POA: Diagnosis not present

## 2018-01-11 DIAGNOSIS — E1165 Type 2 diabetes mellitus with hyperglycemia: Secondary | ICD-10-CM | POA: Diagnosis not present

## 2018-01-11 DIAGNOSIS — M545 Low back pain: Secondary | ICD-10-CM | POA: Diagnosis not present

## 2018-01-11 DIAGNOSIS — N189 Chronic kidney disease, unspecified: Secondary | ICD-10-CM | POA: Diagnosis not present

## 2018-01-11 DIAGNOSIS — F4310 Post-traumatic stress disorder, unspecified: Secondary | ICD-10-CM | POA: Diagnosis not present

## 2018-01-11 NOTE — Telephone Encounter (Signed)
Please advise. Thank You

## 2018-01-12 DIAGNOSIS — N189 Chronic kidney disease, unspecified: Secondary | ICD-10-CM | POA: Diagnosis not present

## 2018-01-12 DIAGNOSIS — E1165 Type 2 diabetes mellitus with hyperglycemia: Secondary | ICD-10-CM | POA: Diagnosis not present

## 2018-01-12 DIAGNOSIS — F4310 Post-traumatic stress disorder, unspecified: Secondary | ICD-10-CM | POA: Diagnosis not present

## 2018-01-12 DIAGNOSIS — I10 Essential (primary) hypertension: Secondary | ICD-10-CM | POA: Diagnosis not present

## 2018-01-12 DIAGNOSIS — M545 Low back pain: Secondary | ICD-10-CM | POA: Diagnosis not present

## 2018-01-15 DIAGNOSIS — N189 Chronic kidney disease, unspecified: Secondary | ICD-10-CM | POA: Diagnosis not present

## 2018-01-15 DIAGNOSIS — I10 Essential (primary) hypertension: Secondary | ICD-10-CM | POA: Diagnosis not present

## 2018-01-15 DIAGNOSIS — E1165 Type 2 diabetes mellitus with hyperglycemia: Secondary | ICD-10-CM | POA: Diagnosis not present

## 2018-01-15 DIAGNOSIS — M545 Low back pain: Secondary | ICD-10-CM | POA: Diagnosis not present

## 2018-01-15 DIAGNOSIS — F4310 Post-traumatic stress disorder, unspecified: Secondary | ICD-10-CM | POA: Diagnosis not present

## 2018-01-16 DIAGNOSIS — N189 Chronic kidney disease, unspecified: Secondary | ICD-10-CM | POA: Diagnosis not present

## 2018-01-16 DIAGNOSIS — B351 Tinea unguium: Secondary | ICD-10-CM | POA: Diagnosis not present

## 2018-01-16 DIAGNOSIS — F4310 Post-traumatic stress disorder, unspecified: Secondary | ICD-10-CM | POA: Diagnosis not present

## 2018-01-16 DIAGNOSIS — L89891 Pressure ulcer of other site, stage 1: Secondary | ICD-10-CM | POA: Diagnosis not present

## 2018-01-16 DIAGNOSIS — M545 Low back pain: Secondary | ICD-10-CM | POA: Diagnosis not present

## 2018-01-16 DIAGNOSIS — M79675 Pain in left toe(s): Secondary | ICD-10-CM | POA: Diagnosis not present

## 2018-01-16 DIAGNOSIS — M79674 Pain in right toe(s): Secondary | ICD-10-CM | POA: Diagnosis not present

## 2018-01-16 DIAGNOSIS — E1165 Type 2 diabetes mellitus with hyperglycemia: Secondary | ICD-10-CM | POA: Diagnosis not present

## 2018-01-16 DIAGNOSIS — M86172 Other acute osteomyelitis, left ankle and foot: Secondary | ICD-10-CM | POA: Diagnosis not present

## 2018-01-16 DIAGNOSIS — I10 Essential (primary) hypertension: Secondary | ICD-10-CM | POA: Diagnosis not present

## 2018-01-17 ENCOUNTER — Encounter (HOSPITAL_COMMUNITY): Payer: Medicaid Other

## 2018-01-17 DIAGNOSIS — M545 Low back pain: Secondary | ICD-10-CM | POA: Diagnosis not present

## 2018-01-17 DIAGNOSIS — I10 Essential (primary) hypertension: Secondary | ICD-10-CM | POA: Diagnosis not present

## 2018-01-17 DIAGNOSIS — F4310 Post-traumatic stress disorder, unspecified: Secondary | ICD-10-CM | POA: Diagnosis not present

## 2018-01-17 DIAGNOSIS — N189 Chronic kidney disease, unspecified: Secondary | ICD-10-CM | POA: Diagnosis not present

## 2018-01-17 DIAGNOSIS — E1165 Type 2 diabetes mellitus with hyperglycemia: Secondary | ICD-10-CM | POA: Diagnosis not present

## 2018-01-18 DIAGNOSIS — M545 Low back pain: Secondary | ICD-10-CM | POA: Diagnosis not present

## 2018-01-18 DIAGNOSIS — N189 Chronic kidney disease, unspecified: Secondary | ICD-10-CM | POA: Diagnosis not present

## 2018-01-18 DIAGNOSIS — F4310 Post-traumatic stress disorder, unspecified: Secondary | ICD-10-CM | POA: Diagnosis not present

## 2018-01-18 DIAGNOSIS — I10 Essential (primary) hypertension: Secondary | ICD-10-CM | POA: Diagnosis not present

## 2018-01-18 DIAGNOSIS — E1165 Type 2 diabetes mellitus with hyperglycemia: Secondary | ICD-10-CM | POA: Diagnosis not present

## 2018-01-19 DIAGNOSIS — F4310 Post-traumatic stress disorder, unspecified: Secondary | ICD-10-CM | POA: Diagnosis not present

## 2018-01-19 DIAGNOSIS — I10 Essential (primary) hypertension: Secondary | ICD-10-CM | POA: Diagnosis not present

## 2018-01-19 DIAGNOSIS — M545 Low back pain: Secondary | ICD-10-CM | POA: Diagnosis not present

## 2018-01-19 DIAGNOSIS — N189 Chronic kidney disease, unspecified: Secondary | ICD-10-CM | POA: Diagnosis not present

## 2018-01-19 DIAGNOSIS — E119 Type 2 diabetes mellitus without complications: Secondary | ICD-10-CM | POA: Diagnosis not present

## 2018-01-19 DIAGNOSIS — I2510 Atherosclerotic heart disease of native coronary artery without angina pectoris: Secondary | ICD-10-CM | POA: Diagnosis not present

## 2018-01-19 DIAGNOSIS — E1165 Type 2 diabetes mellitus with hyperglycemia: Secondary | ICD-10-CM | POA: Diagnosis not present

## 2018-01-22 DIAGNOSIS — E119 Type 2 diabetes mellitus without complications: Secondary | ICD-10-CM | POA: Diagnosis not present

## 2018-01-22 DIAGNOSIS — N189 Chronic kidney disease, unspecified: Secondary | ICD-10-CM | POA: Diagnosis not present

## 2018-01-22 DIAGNOSIS — E1165 Type 2 diabetes mellitus with hyperglycemia: Secondary | ICD-10-CM | POA: Diagnosis not present

## 2018-01-22 DIAGNOSIS — I10 Essential (primary) hypertension: Secondary | ICD-10-CM | POA: Diagnosis not present

## 2018-01-22 DIAGNOSIS — F4310 Post-traumatic stress disorder, unspecified: Secondary | ICD-10-CM | POA: Diagnosis not present

## 2018-01-22 DIAGNOSIS — M545 Low back pain: Secondary | ICD-10-CM | POA: Diagnosis not present

## 2018-01-23 DIAGNOSIS — E1165 Type 2 diabetes mellitus with hyperglycemia: Secondary | ICD-10-CM | POA: Diagnosis not present

## 2018-01-23 DIAGNOSIS — M545 Low back pain: Secondary | ICD-10-CM | POA: Diagnosis not present

## 2018-01-23 DIAGNOSIS — I10 Essential (primary) hypertension: Secondary | ICD-10-CM | POA: Diagnosis not present

## 2018-01-23 DIAGNOSIS — F4310 Post-traumatic stress disorder, unspecified: Secondary | ICD-10-CM | POA: Diagnosis not present

## 2018-01-23 DIAGNOSIS — N189 Chronic kidney disease, unspecified: Secondary | ICD-10-CM | POA: Diagnosis not present

## 2018-01-24 DIAGNOSIS — I10 Essential (primary) hypertension: Secondary | ICD-10-CM | POA: Diagnosis not present

## 2018-01-24 DIAGNOSIS — N189 Chronic kidney disease, unspecified: Secondary | ICD-10-CM | POA: Diagnosis not present

## 2018-01-24 DIAGNOSIS — E1165 Type 2 diabetes mellitus with hyperglycemia: Secondary | ICD-10-CM | POA: Diagnosis not present

## 2018-01-24 DIAGNOSIS — M545 Low back pain: Secondary | ICD-10-CM | POA: Diagnosis not present

## 2018-01-24 DIAGNOSIS — F4310 Post-traumatic stress disorder, unspecified: Secondary | ICD-10-CM | POA: Diagnosis not present

## 2018-01-25 DIAGNOSIS — E1165 Type 2 diabetes mellitus with hyperglycemia: Secondary | ICD-10-CM | POA: Diagnosis not present

## 2018-01-25 DIAGNOSIS — F4310 Post-traumatic stress disorder, unspecified: Secondary | ICD-10-CM | POA: Diagnosis not present

## 2018-01-25 DIAGNOSIS — N189 Chronic kidney disease, unspecified: Secondary | ICD-10-CM | POA: Diagnosis not present

## 2018-01-25 DIAGNOSIS — I10 Essential (primary) hypertension: Secondary | ICD-10-CM | POA: Diagnosis not present

## 2018-01-25 DIAGNOSIS — M545 Low back pain: Secondary | ICD-10-CM | POA: Diagnosis not present

## 2018-01-26 DIAGNOSIS — E119 Type 2 diabetes mellitus without complications: Secondary | ICD-10-CM | POA: Diagnosis not present

## 2018-01-26 DIAGNOSIS — Z7984 Long term (current) use of oral hypoglycemic drugs: Secondary | ICD-10-CM | POA: Diagnosis not present

## 2018-01-26 DIAGNOSIS — I10 Essential (primary) hypertension: Secondary | ICD-10-CM | POA: Diagnosis not present

## 2018-01-26 DIAGNOSIS — I120 Hypertensive chronic kidney disease with stage 5 chronic kidney disease or end stage renal disease: Secondary | ICD-10-CM | POA: Diagnosis not present

## 2018-01-26 DIAGNOSIS — E1122 Type 2 diabetes mellitus with diabetic chronic kidney disease: Secondary | ICD-10-CM | POA: Diagnosis not present

## 2018-01-26 DIAGNOSIS — N185 Chronic kidney disease, stage 5: Secondary | ICD-10-CM | POA: Diagnosis not present

## 2018-01-29 DIAGNOSIS — I10 Essential (primary) hypertension: Secondary | ICD-10-CM | POA: Diagnosis not present

## 2018-01-29 DIAGNOSIS — F4310 Post-traumatic stress disorder, unspecified: Secondary | ICD-10-CM | POA: Diagnosis not present

## 2018-01-29 DIAGNOSIS — E1165 Type 2 diabetes mellitus with hyperglycemia: Secondary | ICD-10-CM | POA: Diagnosis not present

## 2018-01-29 DIAGNOSIS — M545 Low back pain: Secondary | ICD-10-CM | POA: Diagnosis not present

## 2018-01-29 DIAGNOSIS — N189 Chronic kidney disease, unspecified: Secondary | ICD-10-CM | POA: Diagnosis not present

## 2018-01-30 DIAGNOSIS — M79675 Pain in left toe(s): Secondary | ICD-10-CM | POA: Diagnosis not present

## 2018-01-30 DIAGNOSIS — M86172 Other acute osteomyelitis, left ankle and foot: Secondary | ICD-10-CM | POA: Diagnosis not present

## 2018-01-30 DIAGNOSIS — M79674 Pain in right toe(s): Secondary | ICD-10-CM | POA: Diagnosis not present

## 2018-01-30 DIAGNOSIS — L89891 Pressure ulcer of other site, stage 1: Secondary | ICD-10-CM | POA: Diagnosis not present

## 2018-01-30 DIAGNOSIS — I10 Essential (primary) hypertension: Secondary | ICD-10-CM | POA: Diagnosis not present

## 2018-01-30 DIAGNOSIS — N189 Chronic kidney disease, unspecified: Secondary | ICD-10-CM | POA: Diagnosis not present

## 2018-01-30 DIAGNOSIS — M545 Low back pain: Secondary | ICD-10-CM | POA: Diagnosis not present

## 2018-01-30 DIAGNOSIS — E1165 Type 2 diabetes mellitus with hyperglycemia: Secondary | ICD-10-CM | POA: Diagnosis not present

## 2018-01-30 DIAGNOSIS — F4310 Post-traumatic stress disorder, unspecified: Secondary | ICD-10-CM | POA: Diagnosis not present

## 2018-01-30 DIAGNOSIS — B351 Tinea unguium: Secondary | ICD-10-CM | POA: Diagnosis not present

## 2018-01-31 ENCOUNTER — Encounter (HOSPITAL_COMMUNITY): Payer: Medicaid Other

## 2018-01-31 DIAGNOSIS — E1165 Type 2 diabetes mellitus with hyperglycemia: Secondary | ICD-10-CM | POA: Diagnosis not present

## 2018-01-31 DIAGNOSIS — F4310 Post-traumatic stress disorder, unspecified: Secondary | ICD-10-CM | POA: Diagnosis not present

## 2018-01-31 DIAGNOSIS — N189 Chronic kidney disease, unspecified: Secondary | ICD-10-CM | POA: Diagnosis not present

## 2018-01-31 DIAGNOSIS — M545 Low back pain: Secondary | ICD-10-CM | POA: Diagnosis not present

## 2018-01-31 DIAGNOSIS — I10 Essential (primary) hypertension: Secondary | ICD-10-CM | POA: Diagnosis not present

## 2018-02-01 DIAGNOSIS — E1165 Type 2 diabetes mellitus with hyperglycemia: Secondary | ICD-10-CM | POA: Diagnosis not present

## 2018-02-01 DIAGNOSIS — I10 Essential (primary) hypertension: Secondary | ICD-10-CM | POA: Diagnosis not present

## 2018-02-01 DIAGNOSIS — N189 Chronic kidney disease, unspecified: Secondary | ICD-10-CM | POA: Diagnosis not present

## 2018-02-01 DIAGNOSIS — M545 Low back pain: Secondary | ICD-10-CM | POA: Diagnosis not present

## 2018-02-01 DIAGNOSIS — F4310 Post-traumatic stress disorder, unspecified: Secondary | ICD-10-CM | POA: Diagnosis not present

## 2018-02-02 DIAGNOSIS — N189 Chronic kidney disease, unspecified: Secondary | ICD-10-CM | POA: Diagnosis not present

## 2018-02-02 DIAGNOSIS — M545 Low back pain: Secondary | ICD-10-CM | POA: Diagnosis not present

## 2018-02-02 DIAGNOSIS — E1165 Type 2 diabetes mellitus with hyperglycemia: Secondary | ICD-10-CM | POA: Diagnosis not present

## 2018-02-02 DIAGNOSIS — F4310 Post-traumatic stress disorder, unspecified: Secondary | ICD-10-CM | POA: Diagnosis not present

## 2018-02-02 DIAGNOSIS — I10 Essential (primary) hypertension: Secondary | ICD-10-CM | POA: Diagnosis not present

## 2018-02-05 DIAGNOSIS — G894 Chronic pain syndrome: Secondary | ICD-10-CM | POA: Diagnosis not present

## 2018-02-05 DIAGNOSIS — F4310 Post-traumatic stress disorder, unspecified: Secondary | ICD-10-CM | POA: Diagnosis not present

## 2018-02-05 DIAGNOSIS — M79672 Pain in left foot: Secondary | ICD-10-CM | POA: Diagnosis not present

## 2018-02-05 DIAGNOSIS — E1165 Type 2 diabetes mellitus with hyperglycemia: Secondary | ICD-10-CM | POA: Diagnosis not present

## 2018-02-05 DIAGNOSIS — N189 Chronic kidney disease, unspecified: Secondary | ICD-10-CM | POA: Diagnosis not present

## 2018-02-05 DIAGNOSIS — M79671 Pain in right foot: Secondary | ICD-10-CM | POA: Diagnosis not present

## 2018-02-05 DIAGNOSIS — I10 Essential (primary) hypertension: Secondary | ICD-10-CM | POA: Diagnosis not present

## 2018-02-05 DIAGNOSIS — R208 Other disturbances of skin sensation: Secondary | ICD-10-CM | POA: Diagnosis not present

## 2018-02-05 DIAGNOSIS — M545 Low back pain: Secondary | ICD-10-CM | POA: Diagnosis not present

## 2018-02-06 DIAGNOSIS — N189 Chronic kidney disease, unspecified: Secondary | ICD-10-CM | POA: Diagnosis not present

## 2018-02-06 DIAGNOSIS — I10 Essential (primary) hypertension: Secondary | ICD-10-CM | POA: Diagnosis not present

## 2018-02-06 DIAGNOSIS — F4310 Post-traumatic stress disorder, unspecified: Secondary | ICD-10-CM | POA: Diagnosis not present

## 2018-02-06 DIAGNOSIS — E1165 Type 2 diabetes mellitus with hyperglycemia: Secondary | ICD-10-CM | POA: Diagnosis not present

## 2018-02-06 DIAGNOSIS — M545 Low back pain: Secondary | ICD-10-CM | POA: Diagnosis not present

## 2018-02-07 DIAGNOSIS — E1165 Type 2 diabetes mellitus with hyperglycemia: Secondary | ICD-10-CM | POA: Diagnosis not present

## 2018-02-07 DIAGNOSIS — I10 Essential (primary) hypertension: Secondary | ICD-10-CM | POA: Diagnosis not present

## 2018-02-07 DIAGNOSIS — N189 Chronic kidney disease, unspecified: Secondary | ICD-10-CM | POA: Diagnosis not present

## 2018-02-07 DIAGNOSIS — I120 Hypertensive chronic kidney disease with stage 5 chronic kidney disease or end stage renal disease: Secondary | ICD-10-CM | POA: Diagnosis not present

## 2018-02-07 DIAGNOSIS — E119 Type 2 diabetes mellitus without complications: Secondary | ICD-10-CM | POA: Diagnosis not present

## 2018-02-07 DIAGNOSIS — F4310 Post-traumatic stress disorder, unspecified: Secondary | ICD-10-CM | POA: Diagnosis not present

## 2018-02-07 DIAGNOSIS — N185 Chronic kidney disease, stage 5: Secondary | ICD-10-CM | POA: Diagnosis not present

## 2018-02-07 DIAGNOSIS — M545 Low back pain: Secondary | ICD-10-CM | POA: Diagnosis not present

## 2018-02-08 DIAGNOSIS — M545 Low back pain: Secondary | ICD-10-CM | POA: Diagnosis not present

## 2018-02-08 DIAGNOSIS — I10 Essential (primary) hypertension: Secondary | ICD-10-CM | POA: Diagnosis not present

## 2018-02-08 DIAGNOSIS — N189 Chronic kidney disease, unspecified: Secondary | ICD-10-CM | POA: Diagnosis not present

## 2018-02-08 DIAGNOSIS — E1165 Type 2 diabetes mellitus with hyperglycemia: Secondary | ICD-10-CM | POA: Diagnosis not present

## 2018-02-08 DIAGNOSIS — F4310 Post-traumatic stress disorder, unspecified: Secondary | ICD-10-CM | POA: Diagnosis not present

## 2018-02-08 DIAGNOSIS — R9439 Abnormal result of other cardiovascular function study: Secondary | ICD-10-CM | POA: Insufficient documentation

## 2018-02-08 DIAGNOSIS — I5032 Chronic diastolic (congestive) heart failure: Secondary | ICD-10-CM | POA: Diagnosis not present

## 2018-02-09 DIAGNOSIS — E1165 Type 2 diabetes mellitus with hyperglycemia: Secondary | ICD-10-CM | POA: Diagnosis not present

## 2018-02-09 DIAGNOSIS — M545 Low back pain: Secondary | ICD-10-CM | POA: Diagnosis not present

## 2018-02-09 DIAGNOSIS — F4310 Post-traumatic stress disorder, unspecified: Secondary | ICD-10-CM | POA: Diagnosis not present

## 2018-02-09 DIAGNOSIS — N189 Chronic kidney disease, unspecified: Secondary | ICD-10-CM | POA: Diagnosis not present

## 2018-02-09 DIAGNOSIS — I10 Essential (primary) hypertension: Secondary | ICD-10-CM | POA: Diagnosis not present

## 2018-02-12 DIAGNOSIS — M545 Low back pain: Secondary | ICD-10-CM | POA: Diagnosis not present

## 2018-02-12 DIAGNOSIS — I10 Essential (primary) hypertension: Secondary | ICD-10-CM | POA: Diagnosis not present

## 2018-02-12 DIAGNOSIS — F4310 Post-traumatic stress disorder, unspecified: Secondary | ICD-10-CM | POA: Diagnosis not present

## 2018-02-12 DIAGNOSIS — N189 Chronic kidney disease, unspecified: Secondary | ICD-10-CM | POA: Diagnosis not present

## 2018-02-12 DIAGNOSIS — E1165 Type 2 diabetes mellitus with hyperglycemia: Secondary | ICD-10-CM | POA: Diagnosis not present

## 2018-02-13 DIAGNOSIS — N189 Chronic kidney disease, unspecified: Secondary | ICD-10-CM | POA: Diagnosis not present

## 2018-02-13 DIAGNOSIS — M545 Low back pain: Secondary | ICD-10-CM | POA: Diagnosis not present

## 2018-02-13 DIAGNOSIS — F4310 Post-traumatic stress disorder, unspecified: Secondary | ICD-10-CM | POA: Diagnosis not present

## 2018-02-13 DIAGNOSIS — I10 Essential (primary) hypertension: Secondary | ICD-10-CM | POA: Diagnosis not present

## 2018-02-13 DIAGNOSIS — E1165 Type 2 diabetes mellitus with hyperglycemia: Secondary | ICD-10-CM | POA: Diagnosis not present

## 2018-02-14 ENCOUNTER — Ambulatory Visit: Payer: Medicaid Other | Admitting: Cardiology

## 2018-02-14 DIAGNOSIS — E1165 Type 2 diabetes mellitus with hyperglycemia: Secondary | ICD-10-CM | POA: Diagnosis not present

## 2018-02-14 DIAGNOSIS — I10 Essential (primary) hypertension: Secondary | ICD-10-CM | POA: Diagnosis not present

## 2018-02-14 DIAGNOSIS — M545 Low back pain: Secondary | ICD-10-CM | POA: Diagnosis not present

## 2018-02-14 DIAGNOSIS — N189 Chronic kidney disease, unspecified: Secondary | ICD-10-CM | POA: Diagnosis not present

## 2018-02-14 DIAGNOSIS — F4310 Post-traumatic stress disorder, unspecified: Secondary | ICD-10-CM | POA: Diagnosis not present

## 2018-02-15 DIAGNOSIS — E1165 Type 2 diabetes mellitus with hyperglycemia: Secondary | ICD-10-CM | POA: Diagnosis not present

## 2018-02-15 DIAGNOSIS — N189 Chronic kidney disease, unspecified: Secondary | ICD-10-CM | POA: Diagnosis not present

## 2018-02-15 DIAGNOSIS — M545 Low back pain: Secondary | ICD-10-CM | POA: Diagnosis not present

## 2018-02-15 DIAGNOSIS — F4310 Post-traumatic stress disorder, unspecified: Secondary | ICD-10-CM | POA: Diagnosis not present

## 2018-02-15 DIAGNOSIS — I10 Essential (primary) hypertension: Secondary | ICD-10-CM | POA: Diagnosis not present

## 2018-02-16 DIAGNOSIS — F4310 Post-traumatic stress disorder, unspecified: Secondary | ICD-10-CM | POA: Diagnosis not present

## 2018-02-16 DIAGNOSIS — M545 Low back pain: Secondary | ICD-10-CM | POA: Diagnosis not present

## 2018-02-16 DIAGNOSIS — I10 Essential (primary) hypertension: Secondary | ICD-10-CM | POA: Diagnosis not present

## 2018-02-16 DIAGNOSIS — E1165 Type 2 diabetes mellitus with hyperglycemia: Secondary | ICD-10-CM | POA: Diagnosis not present

## 2018-02-16 DIAGNOSIS — N189 Chronic kidney disease, unspecified: Secondary | ICD-10-CM | POA: Diagnosis not present

## 2018-02-19 DIAGNOSIS — M79675 Pain in left toe(s): Secondary | ICD-10-CM | POA: Diagnosis not present

## 2018-02-19 DIAGNOSIS — M79674 Pain in right toe(s): Secondary | ICD-10-CM | POA: Diagnosis not present

## 2018-02-19 DIAGNOSIS — E1165 Type 2 diabetes mellitus with hyperglycemia: Secondary | ICD-10-CM | POA: Diagnosis not present

## 2018-02-19 DIAGNOSIS — F4310 Post-traumatic stress disorder, unspecified: Secondary | ICD-10-CM | POA: Diagnosis not present

## 2018-02-19 DIAGNOSIS — I10 Essential (primary) hypertension: Secondary | ICD-10-CM | POA: Diagnosis not present

## 2018-02-19 DIAGNOSIS — N189 Chronic kidney disease, unspecified: Secondary | ICD-10-CM | POA: Diagnosis not present

## 2018-02-19 DIAGNOSIS — M86172 Other acute osteomyelitis, left ankle and foot: Secondary | ICD-10-CM | POA: Diagnosis not present

## 2018-02-19 DIAGNOSIS — M545 Low back pain: Secondary | ICD-10-CM | POA: Diagnosis not present

## 2018-02-19 DIAGNOSIS — B351 Tinea unguium: Secondary | ICD-10-CM | POA: Diagnosis not present

## 2018-02-19 DIAGNOSIS — L97512 Non-pressure chronic ulcer of other part of right foot with fat layer exposed: Secondary | ICD-10-CM | POA: Diagnosis not present

## 2018-02-19 DIAGNOSIS — M7752 Other enthesopathy of left foot: Secondary | ICD-10-CM | POA: Diagnosis not present

## 2018-02-19 DIAGNOSIS — L89891 Pressure ulcer of other site, stage 1: Secondary | ICD-10-CM | POA: Diagnosis not present

## 2018-02-20 DIAGNOSIS — I10 Essential (primary) hypertension: Secondary | ICD-10-CM | POA: Diagnosis not present

## 2018-02-20 DIAGNOSIS — E1165 Type 2 diabetes mellitus with hyperglycemia: Secondary | ICD-10-CM | POA: Diagnosis not present

## 2018-02-20 DIAGNOSIS — N189 Chronic kidney disease, unspecified: Secondary | ICD-10-CM | POA: Diagnosis not present

## 2018-02-20 DIAGNOSIS — F4310 Post-traumatic stress disorder, unspecified: Secondary | ICD-10-CM | POA: Diagnosis not present

## 2018-02-20 DIAGNOSIS — M545 Low back pain: Secondary | ICD-10-CM | POA: Diagnosis not present

## 2018-02-21 DIAGNOSIS — M24575 Contracture, left foot: Secondary | ICD-10-CM | POA: Diagnosis not present

## 2018-02-21 DIAGNOSIS — M79675 Pain in left toe(s): Secondary | ICD-10-CM | POA: Diagnosis not present

## 2018-02-21 DIAGNOSIS — M86172 Other acute osteomyelitis, left ankle and foot: Secondary | ICD-10-CM | POA: Diagnosis not present

## 2018-02-21 DIAGNOSIS — B351 Tinea unguium: Secondary | ICD-10-CM | POA: Diagnosis not present

## 2018-02-21 DIAGNOSIS — I10 Essential (primary) hypertension: Secondary | ICD-10-CM | POA: Diagnosis not present

## 2018-02-21 DIAGNOSIS — M79674 Pain in right toe(s): Secondary | ICD-10-CM | POA: Diagnosis not present

## 2018-02-21 DIAGNOSIS — L97512 Non-pressure chronic ulcer of other part of right foot with fat layer exposed: Secondary | ICD-10-CM | POA: Diagnosis not present

## 2018-02-21 DIAGNOSIS — M7752 Other enthesopathy of left foot: Secondary | ICD-10-CM | POA: Diagnosis not present

## 2018-02-21 DIAGNOSIS — M545 Low back pain: Secondary | ICD-10-CM | POA: Diagnosis not present

## 2018-02-21 DIAGNOSIS — F4310 Post-traumatic stress disorder, unspecified: Secondary | ICD-10-CM | POA: Diagnosis not present

## 2018-02-21 DIAGNOSIS — E1165 Type 2 diabetes mellitus with hyperglycemia: Secondary | ICD-10-CM | POA: Diagnosis not present

## 2018-02-21 DIAGNOSIS — L89891 Pressure ulcer of other site, stage 1: Secondary | ICD-10-CM | POA: Diagnosis not present

## 2018-02-21 DIAGNOSIS — N189 Chronic kidney disease, unspecified: Secondary | ICD-10-CM | POA: Diagnosis not present

## 2018-02-22 DIAGNOSIS — M545 Low back pain: Secondary | ICD-10-CM | POA: Diagnosis not present

## 2018-02-22 DIAGNOSIS — F4310 Post-traumatic stress disorder, unspecified: Secondary | ICD-10-CM | POA: Diagnosis not present

## 2018-02-22 DIAGNOSIS — I10 Essential (primary) hypertension: Secondary | ICD-10-CM | POA: Diagnosis not present

## 2018-02-22 DIAGNOSIS — N189 Chronic kidney disease, unspecified: Secondary | ICD-10-CM | POA: Diagnosis not present

## 2018-02-22 DIAGNOSIS — E1165 Type 2 diabetes mellitus with hyperglycemia: Secondary | ICD-10-CM | POA: Diagnosis not present

## 2018-02-23 DIAGNOSIS — I10 Essential (primary) hypertension: Secondary | ICD-10-CM | POA: Diagnosis not present

## 2018-02-23 DIAGNOSIS — N189 Chronic kidney disease, unspecified: Secondary | ICD-10-CM | POA: Diagnosis not present

## 2018-02-23 DIAGNOSIS — E1165 Type 2 diabetes mellitus with hyperglycemia: Secondary | ICD-10-CM | POA: Diagnosis not present

## 2018-02-23 DIAGNOSIS — M545 Low back pain: Secondary | ICD-10-CM | POA: Diagnosis not present

## 2018-02-23 DIAGNOSIS — F4310 Post-traumatic stress disorder, unspecified: Secondary | ICD-10-CM | POA: Diagnosis not present

## 2018-02-26 DIAGNOSIS — M545 Low back pain: Secondary | ICD-10-CM | POA: Diagnosis not present

## 2018-02-26 DIAGNOSIS — E1165 Type 2 diabetes mellitus with hyperglycemia: Secondary | ICD-10-CM | POA: Diagnosis not present

## 2018-02-26 DIAGNOSIS — F4310 Post-traumatic stress disorder, unspecified: Secondary | ICD-10-CM | POA: Diagnosis not present

## 2018-02-26 DIAGNOSIS — N189 Chronic kidney disease, unspecified: Secondary | ICD-10-CM | POA: Diagnosis not present

## 2018-02-26 DIAGNOSIS — I10 Essential (primary) hypertension: Secondary | ICD-10-CM | POA: Diagnosis not present

## 2018-02-27 DIAGNOSIS — M545 Low back pain: Secondary | ICD-10-CM | POA: Diagnosis not present

## 2018-02-27 DIAGNOSIS — M47816 Spondylosis without myelopathy or radiculopathy, lumbar region: Secondary | ICD-10-CM | POA: Diagnosis not present

## 2018-02-27 DIAGNOSIS — M5136 Other intervertebral disc degeneration, lumbar region: Secondary | ICD-10-CM | POA: Diagnosis not present

## 2018-02-27 DIAGNOSIS — F4310 Post-traumatic stress disorder, unspecified: Secondary | ICD-10-CM | POA: Diagnosis not present

## 2018-02-27 DIAGNOSIS — N189 Chronic kidney disease, unspecified: Secondary | ICD-10-CM | POA: Diagnosis not present

## 2018-02-27 DIAGNOSIS — I10 Essential (primary) hypertension: Secondary | ICD-10-CM | POA: Diagnosis not present

## 2018-02-27 DIAGNOSIS — E1165 Type 2 diabetes mellitus with hyperglycemia: Secondary | ICD-10-CM | POA: Diagnosis not present

## 2018-02-28 DIAGNOSIS — G4700 Insomnia, unspecified: Secondary | ICD-10-CM | POA: Diagnosis not present

## 2018-02-28 DIAGNOSIS — H5440 Blindness, one eye, unspecified eye: Secondary | ICD-10-CM | POA: Diagnosis not present

## 2018-02-28 DIAGNOSIS — F4310 Post-traumatic stress disorder, unspecified: Secondary | ICD-10-CM | POA: Diagnosis not present

## 2018-02-28 DIAGNOSIS — D649 Anemia, unspecified: Secondary | ICD-10-CM | POA: Diagnosis not present

## 2018-02-28 DIAGNOSIS — E119 Type 2 diabetes mellitus without complications: Secondary | ICD-10-CM | POA: Diagnosis not present

## 2018-02-28 DIAGNOSIS — M549 Dorsalgia, unspecified: Secondary | ICD-10-CM | POA: Diagnosis not present

## 2018-02-28 DIAGNOSIS — E1165 Type 2 diabetes mellitus with hyperglycemia: Secondary | ICD-10-CM | POA: Diagnosis not present

## 2018-02-28 DIAGNOSIS — M545 Low back pain: Secondary | ICD-10-CM | POA: Diagnosis not present

## 2018-02-28 DIAGNOSIS — N189 Chronic kidney disease, unspecified: Secondary | ICD-10-CM | POA: Diagnosis not present

## 2018-02-28 DIAGNOSIS — N185 Chronic kidney disease, stage 5: Secondary | ICD-10-CM | POA: Diagnosis not present

## 2018-02-28 DIAGNOSIS — I2510 Atherosclerotic heart disease of native coronary artery without angina pectoris: Secondary | ICD-10-CM | POA: Diagnosis not present

## 2018-02-28 DIAGNOSIS — H409 Unspecified glaucoma: Secondary | ICD-10-CM | POA: Diagnosis not present

## 2018-02-28 DIAGNOSIS — G629 Polyneuropathy, unspecified: Secondary | ICD-10-CM | POA: Diagnosis not present

## 2018-02-28 DIAGNOSIS — I10 Essential (primary) hypertension: Secondary | ICD-10-CM | POA: Diagnosis not present

## 2018-02-28 DIAGNOSIS — Z13228 Encounter for screening for other metabolic disorders: Secondary | ICD-10-CM | POA: Diagnosis not present

## 2018-03-01 DIAGNOSIS — I10 Essential (primary) hypertension: Secondary | ICD-10-CM | POA: Diagnosis not present

## 2018-03-01 DIAGNOSIS — E1165 Type 2 diabetes mellitus with hyperglycemia: Secondary | ICD-10-CM | POA: Diagnosis not present

## 2018-03-01 DIAGNOSIS — F4310 Post-traumatic stress disorder, unspecified: Secondary | ICD-10-CM | POA: Diagnosis not present

## 2018-03-01 DIAGNOSIS — M545 Low back pain: Secondary | ICD-10-CM | POA: Diagnosis not present

## 2018-03-01 DIAGNOSIS — N189 Chronic kidney disease, unspecified: Secondary | ICD-10-CM | POA: Diagnosis not present

## 2018-03-02 DIAGNOSIS — I10 Essential (primary) hypertension: Secondary | ICD-10-CM | POA: Diagnosis not present

## 2018-03-02 DIAGNOSIS — M545 Low back pain: Secondary | ICD-10-CM | POA: Diagnosis not present

## 2018-03-02 DIAGNOSIS — E1165 Type 2 diabetes mellitus with hyperglycemia: Secondary | ICD-10-CM | POA: Diagnosis not present

## 2018-03-02 DIAGNOSIS — F4310 Post-traumatic stress disorder, unspecified: Secondary | ICD-10-CM | POA: Diagnosis not present

## 2018-03-02 DIAGNOSIS — N189 Chronic kidney disease, unspecified: Secondary | ICD-10-CM | POA: Diagnosis not present

## 2018-03-05 DIAGNOSIS — E1165 Type 2 diabetes mellitus with hyperglycemia: Secondary | ICD-10-CM | POA: Diagnosis not present

## 2018-03-05 DIAGNOSIS — M545 Low back pain: Secondary | ICD-10-CM | POA: Diagnosis not present

## 2018-03-05 DIAGNOSIS — N189 Chronic kidney disease, unspecified: Secondary | ICD-10-CM | POA: Diagnosis not present

## 2018-03-05 DIAGNOSIS — I10 Essential (primary) hypertension: Secondary | ICD-10-CM | POA: Diagnosis not present

## 2018-03-05 DIAGNOSIS — F4310 Post-traumatic stress disorder, unspecified: Secondary | ICD-10-CM | POA: Diagnosis not present

## 2018-03-06 DIAGNOSIS — E1165 Type 2 diabetes mellitus with hyperglycemia: Secondary | ICD-10-CM | POA: Diagnosis not present

## 2018-03-06 DIAGNOSIS — N189 Chronic kidney disease, unspecified: Secondary | ICD-10-CM | POA: Diagnosis not present

## 2018-03-06 DIAGNOSIS — M545 Low back pain: Secondary | ICD-10-CM | POA: Diagnosis not present

## 2018-03-06 DIAGNOSIS — I10 Essential (primary) hypertension: Secondary | ICD-10-CM | POA: Diagnosis not present

## 2018-03-06 DIAGNOSIS — F4310 Post-traumatic stress disorder, unspecified: Secondary | ICD-10-CM | POA: Diagnosis not present

## 2018-03-07 DIAGNOSIS — N189 Chronic kidney disease, unspecified: Secondary | ICD-10-CM | POA: Diagnosis not present

## 2018-03-07 DIAGNOSIS — E1165 Type 2 diabetes mellitus with hyperglycemia: Secondary | ICD-10-CM | POA: Diagnosis not present

## 2018-03-07 DIAGNOSIS — F4310 Post-traumatic stress disorder, unspecified: Secondary | ICD-10-CM | POA: Diagnosis not present

## 2018-03-07 DIAGNOSIS — M545 Low back pain: Secondary | ICD-10-CM | POA: Diagnosis not present

## 2018-03-07 DIAGNOSIS — I10 Essential (primary) hypertension: Secondary | ICD-10-CM | POA: Diagnosis not present

## 2018-03-08 DIAGNOSIS — N189 Chronic kidney disease, unspecified: Secondary | ICD-10-CM | POA: Diagnosis not present

## 2018-03-08 DIAGNOSIS — E1165 Type 2 diabetes mellitus with hyperglycemia: Secondary | ICD-10-CM | POA: Diagnosis not present

## 2018-03-08 DIAGNOSIS — I10 Essential (primary) hypertension: Secondary | ICD-10-CM | POA: Diagnosis not present

## 2018-03-08 DIAGNOSIS — M545 Low back pain: Secondary | ICD-10-CM | POA: Diagnosis not present

## 2018-03-08 DIAGNOSIS — F4310 Post-traumatic stress disorder, unspecified: Secondary | ICD-10-CM | POA: Diagnosis not present

## 2018-03-09 DIAGNOSIS — F4310 Post-traumatic stress disorder, unspecified: Secondary | ICD-10-CM | POA: Diagnosis not present

## 2018-03-09 DIAGNOSIS — E1165 Type 2 diabetes mellitus with hyperglycemia: Secondary | ICD-10-CM | POA: Diagnosis not present

## 2018-03-09 DIAGNOSIS — N189 Chronic kidney disease, unspecified: Secondary | ICD-10-CM | POA: Diagnosis not present

## 2018-03-09 DIAGNOSIS — M545 Low back pain: Secondary | ICD-10-CM | POA: Diagnosis not present

## 2018-03-09 DIAGNOSIS — I10 Essential (primary) hypertension: Secondary | ICD-10-CM | POA: Diagnosis not present

## 2018-03-12 DIAGNOSIS — N189 Chronic kidney disease, unspecified: Secondary | ICD-10-CM | POA: Diagnosis not present

## 2018-03-12 DIAGNOSIS — M79672 Pain in left foot: Secondary | ICD-10-CM | POA: Diagnosis not present

## 2018-03-12 DIAGNOSIS — G894 Chronic pain syndrome: Secondary | ICD-10-CM | POA: Diagnosis not present

## 2018-03-12 DIAGNOSIS — F4310 Post-traumatic stress disorder, unspecified: Secondary | ICD-10-CM | POA: Diagnosis not present

## 2018-03-12 DIAGNOSIS — R208 Other disturbances of skin sensation: Secondary | ICD-10-CM | POA: Diagnosis not present

## 2018-03-12 DIAGNOSIS — M79671 Pain in right foot: Secondary | ICD-10-CM | POA: Diagnosis not present

## 2018-03-12 DIAGNOSIS — M79605 Pain in left leg: Secondary | ICD-10-CM | POA: Diagnosis not present

## 2018-03-12 DIAGNOSIS — M545 Low back pain: Secondary | ICD-10-CM | POA: Diagnosis not present

## 2018-03-12 DIAGNOSIS — I10 Essential (primary) hypertension: Secondary | ICD-10-CM | POA: Diagnosis not present

## 2018-03-12 DIAGNOSIS — M79604 Pain in right leg: Secondary | ICD-10-CM | POA: Diagnosis not present

## 2018-03-12 DIAGNOSIS — M25571 Pain in right ankle and joints of right foot: Secondary | ICD-10-CM | POA: Diagnosis not present

## 2018-03-12 DIAGNOSIS — E1165 Type 2 diabetes mellitus with hyperglycemia: Secondary | ICD-10-CM | POA: Diagnosis not present

## 2018-03-12 DIAGNOSIS — M25572 Pain in left ankle and joints of left foot: Secondary | ICD-10-CM | POA: Diagnosis not present

## 2018-03-13 DIAGNOSIS — E1165 Type 2 diabetes mellitus with hyperglycemia: Secondary | ICD-10-CM | POA: Diagnosis not present

## 2018-03-13 DIAGNOSIS — I10 Essential (primary) hypertension: Secondary | ICD-10-CM | POA: Diagnosis not present

## 2018-03-13 DIAGNOSIS — N189 Chronic kidney disease, unspecified: Secondary | ICD-10-CM | POA: Diagnosis not present

## 2018-03-13 DIAGNOSIS — M545 Low back pain: Secondary | ICD-10-CM | POA: Diagnosis not present

## 2018-03-13 DIAGNOSIS — F4310 Post-traumatic stress disorder, unspecified: Secondary | ICD-10-CM | POA: Diagnosis not present

## 2018-03-14 DIAGNOSIS — M545 Low back pain: Secondary | ICD-10-CM | POA: Diagnosis not present

## 2018-03-14 DIAGNOSIS — M86172 Other acute osteomyelitis, left ankle and foot: Secondary | ICD-10-CM | POA: Diagnosis not present

## 2018-03-14 DIAGNOSIS — M7752 Other enthesopathy of left foot: Secondary | ICD-10-CM | POA: Diagnosis not present

## 2018-03-14 DIAGNOSIS — L89891 Pressure ulcer of other site, stage 1: Secondary | ICD-10-CM | POA: Diagnosis not present

## 2018-03-14 DIAGNOSIS — L97521 Non-pressure chronic ulcer of other part of left foot limited to breakdown of skin: Secondary | ICD-10-CM | POA: Diagnosis not present

## 2018-03-14 DIAGNOSIS — M79674 Pain in right toe(s): Secondary | ICD-10-CM | POA: Diagnosis not present

## 2018-03-14 DIAGNOSIS — F4310 Post-traumatic stress disorder, unspecified: Secondary | ICD-10-CM | POA: Diagnosis not present

## 2018-03-14 DIAGNOSIS — N189 Chronic kidney disease, unspecified: Secondary | ICD-10-CM | POA: Diagnosis not present

## 2018-03-14 DIAGNOSIS — I10 Essential (primary) hypertension: Secondary | ICD-10-CM | POA: Diagnosis not present

## 2018-03-14 DIAGNOSIS — E1165 Type 2 diabetes mellitus with hyperglycemia: Secondary | ICD-10-CM | POA: Diagnosis not present

## 2018-03-14 DIAGNOSIS — M24575 Contracture, left foot: Secondary | ICD-10-CM | POA: Diagnosis not present

## 2018-03-14 DIAGNOSIS — M79675 Pain in left toe(s): Secondary | ICD-10-CM | POA: Diagnosis not present

## 2018-03-14 DIAGNOSIS — B351 Tinea unguium: Secondary | ICD-10-CM | POA: Diagnosis not present

## 2018-03-15 DIAGNOSIS — I10 Essential (primary) hypertension: Secondary | ICD-10-CM | POA: Diagnosis not present

## 2018-03-15 DIAGNOSIS — M545 Low back pain: Secondary | ICD-10-CM | POA: Diagnosis not present

## 2018-03-15 DIAGNOSIS — F4310 Post-traumatic stress disorder, unspecified: Secondary | ICD-10-CM | POA: Diagnosis not present

## 2018-03-15 DIAGNOSIS — N189 Chronic kidney disease, unspecified: Secondary | ICD-10-CM | POA: Diagnosis not present

## 2018-03-15 DIAGNOSIS — E1165 Type 2 diabetes mellitus with hyperglycemia: Secondary | ICD-10-CM | POA: Diagnosis not present

## 2018-03-16 DIAGNOSIS — I10 Essential (primary) hypertension: Secondary | ICD-10-CM | POA: Diagnosis not present

## 2018-03-16 DIAGNOSIS — N189 Chronic kidney disease, unspecified: Secondary | ICD-10-CM | POA: Diagnosis not present

## 2018-03-16 DIAGNOSIS — M545 Low back pain: Secondary | ICD-10-CM | POA: Diagnosis not present

## 2018-03-16 DIAGNOSIS — E1165 Type 2 diabetes mellitus with hyperglycemia: Secondary | ICD-10-CM | POA: Diagnosis not present

## 2018-03-16 DIAGNOSIS — F4310 Post-traumatic stress disorder, unspecified: Secondary | ICD-10-CM | POA: Diagnosis not present

## 2018-03-19 DIAGNOSIS — N189 Chronic kidney disease, unspecified: Secondary | ICD-10-CM | POA: Diagnosis not present

## 2018-03-19 DIAGNOSIS — L97521 Non-pressure chronic ulcer of other part of left foot limited to breakdown of skin: Secondary | ICD-10-CM | POA: Diagnosis not present

## 2018-03-19 DIAGNOSIS — M7752 Other enthesopathy of left foot: Secondary | ICD-10-CM | POA: Diagnosis not present

## 2018-03-19 DIAGNOSIS — L89891 Pressure ulcer of other site, stage 1: Secondary | ICD-10-CM | POA: Diagnosis not present

## 2018-03-19 DIAGNOSIS — B351 Tinea unguium: Secondary | ICD-10-CM | POA: Diagnosis not present

## 2018-03-19 DIAGNOSIS — M79674 Pain in right toe(s): Secondary | ICD-10-CM | POA: Diagnosis not present

## 2018-03-19 DIAGNOSIS — M24575 Contracture, left foot: Secondary | ICD-10-CM | POA: Diagnosis not present

## 2018-03-19 DIAGNOSIS — F4310 Post-traumatic stress disorder, unspecified: Secondary | ICD-10-CM | POA: Diagnosis not present

## 2018-03-19 DIAGNOSIS — E1165 Type 2 diabetes mellitus with hyperglycemia: Secondary | ICD-10-CM | POA: Diagnosis not present

## 2018-03-19 DIAGNOSIS — I10 Essential (primary) hypertension: Secondary | ICD-10-CM | POA: Diagnosis not present

## 2018-03-19 DIAGNOSIS — M545 Low back pain: Secondary | ICD-10-CM | POA: Diagnosis not present

## 2018-03-19 DIAGNOSIS — M79675 Pain in left toe(s): Secondary | ICD-10-CM | POA: Diagnosis not present

## 2018-03-19 DIAGNOSIS — M86172 Other acute osteomyelitis, left ankle and foot: Secondary | ICD-10-CM | POA: Diagnosis not present

## 2018-03-20 DIAGNOSIS — M545 Low back pain: Secondary | ICD-10-CM | POA: Diagnosis not present

## 2018-03-20 DIAGNOSIS — N189 Chronic kidney disease, unspecified: Secondary | ICD-10-CM | POA: Diagnosis not present

## 2018-03-20 DIAGNOSIS — E1165 Type 2 diabetes mellitus with hyperglycemia: Secondary | ICD-10-CM | POA: Diagnosis not present

## 2018-03-20 DIAGNOSIS — I10 Essential (primary) hypertension: Secondary | ICD-10-CM | POA: Diagnosis not present

## 2018-03-20 DIAGNOSIS — F4310 Post-traumatic stress disorder, unspecified: Secondary | ICD-10-CM | POA: Diagnosis not present

## 2018-03-21 DIAGNOSIS — N189 Chronic kidney disease, unspecified: Secondary | ICD-10-CM | POA: Diagnosis not present

## 2018-03-21 DIAGNOSIS — M545 Low back pain: Secondary | ICD-10-CM | POA: Diagnosis not present

## 2018-03-21 DIAGNOSIS — I10 Essential (primary) hypertension: Secondary | ICD-10-CM | POA: Diagnosis not present

## 2018-03-21 DIAGNOSIS — E1165 Type 2 diabetes mellitus with hyperglycemia: Secondary | ICD-10-CM | POA: Diagnosis not present

## 2018-03-21 DIAGNOSIS — F4310 Post-traumatic stress disorder, unspecified: Secondary | ICD-10-CM | POA: Diagnosis not present

## 2018-03-22 DIAGNOSIS — F4310 Post-traumatic stress disorder, unspecified: Secondary | ICD-10-CM | POA: Diagnosis not present

## 2018-03-22 DIAGNOSIS — I10 Essential (primary) hypertension: Secondary | ICD-10-CM | POA: Diagnosis not present

## 2018-03-22 DIAGNOSIS — N189 Chronic kidney disease, unspecified: Secondary | ICD-10-CM | POA: Diagnosis not present

## 2018-03-22 DIAGNOSIS — M545 Low back pain: Secondary | ICD-10-CM | POA: Diagnosis not present

## 2018-03-22 DIAGNOSIS — E1165 Type 2 diabetes mellitus with hyperglycemia: Secondary | ICD-10-CM | POA: Diagnosis not present

## 2018-03-23 DIAGNOSIS — I10 Essential (primary) hypertension: Secondary | ICD-10-CM | POA: Diagnosis not present

## 2018-03-23 DIAGNOSIS — F4310 Post-traumatic stress disorder, unspecified: Secondary | ICD-10-CM | POA: Diagnosis not present

## 2018-03-23 DIAGNOSIS — E1165 Type 2 diabetes mellitus with hyperglycemia: Secondary | ICD-10-CM | POA: Diagnosis not present

## 2018-03-23 DIAGNOSIS — N189 Chronic kidney disease, unspecified: Secondary | ICD-10-CM | POA: Diagnosis not present

## 2018-03-23 DIAGNOSIS — M545 Low back pain: Secondary | ICD-10-CM | POA: Diagnosis not present

## 2018-03-26 DIAGNOSIS — M545 Low back pain: Secondary | ICD-10-CM | POA: Diagnosis not present

## 2018-03-26 DIAGNOSIS — F4310 Post-traumatic stress disorder, unspecified: Secondary | ICD-10-CM | POA: Diagnosis not present

## 2018-03-26 DIAGNOSIS — N189 Chronic kidney disease, unspecified: Secondary | ICD-10-CM | POA: Diagnosis not present

## 2018-03-26 DIAGNOSIS — I10 Essential (primary) hypertension: Secondary | ICD-10-CM | POA: Diagnosis not present

## 2018-03-26 DIAGNOSIS — E1165 Type 2 diabetes mellitus with hyperglycemia: Secondary | ICD-10-CM | POA: Diagnosis not present

## 2018-03-27 DIAGNOSIS — F4310 Post-traumatic stress disorder, unspecified: Secondary | ICD-10-CM | POA: Diagnosis not present

## 2018-03-27 DIAGNOSIS — N189 Chronic kidney disease, unspecified: Secondary | ICD-10-CM | POA: Diagnosis not present

## 2018-03-27 DIAGNOSIS — I10 Essential (primary) hypertension: Secondary | ICD-10-CM | POA: Diagnosis not present

## 2018-03-27 DIAGNOSIS — M545 Low back pain: Secondary | ICD-10-CM | POA: Diagnosis not present

## 2018-03-27 DIAGNOSIS — E1165 Type 2 diabetes mellitus with hyperglycemia: Secondary | ICD-10-CM | POA: Diagnosis not present

## 2018-03-28 DIAGNOSIS — I10 Essential (primary) hypertension: Secondary | ICD-10-CM | POA: Diagnosis not present

## 2018-03-28 DIAGNOSIS — E1165 Type 2 diabetes mellitus with hyperglycemia: Secondary | ICD-10-CM | POA: Diagnosis not present

## 2018-03-28 DIAGNOSIS — M545 Low back pain: Secondary | ICD-10-CM | POA: Diagnosis not present

## 2018-03-28 DIAGNOSIS — N189 Chronic kidney disease, unspecified: Secondary | ICD-10-CM | POA: Diagnosis not present

## 2018-03-28 DIAGNOSIS — F4310 Post-traumatic stress disorder, unspecified: Secondary | ICD-10-CM | POA: Diagnosis not present

## 2018-03-29 DIAGNOSIS — E1165 Type 2 diabetes mellitus with hyperglycemia: Secondary | ICD-10-CM | POA: Diagnosis not present

## 2018-03-29 DIAGNOSIS — I10 Essential (primary) hypertension: Secondary | ICD-10-CM | POA: Diagnosis not present

## 2018-03-29 DIAGNOSIS — N189 Chronic kidney disease, unspecified: Secondary | ICD-10-CM | POA: Diagnosis not present

## 2018-03-29 DIAGNOSIS — F4310 Post-traumatic stress disorder, unspecified: Secondary | ICD-10-CM | POA: Diagnosis not present

## 2018-03-29 DIAGNOSIS — M545 Low back pain: Secondary | ICD-10-CM | POA: Diagnosis not present

## 2018-03-30 DIAGNOSIS — R748 Abnormal levels of other serum enzymes: Secondary | ICD-10-CM | POA: Diagnosis not present

## 2018-03-30 DIAGNOSIS — L97521 Non-pressure chronic ulcer of other part of left foot limited to breakdown of skin: Secondary | ICD-10-CM | POA: Diagnosis not present

## 2018-03-30 DIAGNOSIS — M545 Low back pain: Secondary | ICD-10-CM | POA: Diagnosis not present

## 2018-03-30 DIAGNOSIS — M24575 Contracture, left foot: Secondary | ICD-10-CM | POA: Diagnosis not present

## 2018-03-30 DIAGNOSIS — L89891 Pressure ulcer of other site, stage 1: Secondary | ICD-10-CM | POA: Diagnosis not present

## 2018-03-30 DIAGNOSIS — F4310 Post-traumatic stress disorder, unspecified: Secondary | ICD-10-CM | POA: Diagnosis not present

## 2018-03-30 DIAGNOSIS — B351 Tinea unguium: Secondary | ICD-10-CM | POA: Diagnosis not present

## 2018-03-30 DIAGNOSIS — M86172 Other acute osteomyelitis, left ankle and foot: Secondary | ICD-10-CM | POA: Diagnosis not present

## 2018-03-30 DIAGNOSIS — I10 Essential (primary) hypertension: Secondary | ICD-10-CM | POA: Diagnosis not present

## 2018-03-30 DIAGNOSIS — M79675 Pain in left toe(s): Secondary | ICD-10-CM | POA: Diagnosis not present

## 2018-03-30 DIAGNOSIS — M79674 Pain in right toe(s): Secondary | ICD-10-CM | POA: Diagnosis not present

## 2018-03-30 DIAGNOSIS — E1165 Type 2 diabetes mellitus with hyperglycemia: Secondary | ICD-10-CM | POA: Diagnosis not present

## 2018-03-30 DIAGNOSIS — N189 Chronic kidney disease, unspecified: Secondary | ICD-10-CM | POA: Diagnosis not present

## 2018-03-30 DIAGNOSIS — M7752 Other enthesopathy of left foot: Secondary | ICD-10-CM | POA: Diagnosis not present

## 2018-03-30 DIAGNOSIS — D631 Anemia in chronic kidney disease: Secondary | ICD-10-CM | POA: Diagnosis not present

## 2018-04-02 DIAGNOSIS — M79672 Pain in left foot: Secondary | ICD-10-CM | POA: Diagnosis not present

## 2018-04-02 DIAGNOSIS — E1165 Type 2 diabetes mellitus with hyperglycemia: Secondary | ICD-10-CM | POA: Diagnosis not present

## 2018-04-02 DIAGNOSIS — M51369 Other intervertebral disc degeneration, lumbar region without mention of lumbar back pain or lower extremity pain: Secondary | ICD-10-CM | POA: Insufficient documentation

## 2018-04-02 DIAGNOSIS — M79671 Pain in right foot: Secondary | ICD-10-CM | POA: Insufficient documentation

## 2018-04-02 DIAGNOSIS — G8929 Other chronic pain: Secondary | ICD-10-CM | POA: Insufficient documentation

## 2018-04-02 DIAGNOSIS — M545 Low back pain: Secondary | ICD-10-CM | POA: Diagnosis not present

## 2018-04-02 DIAGNOSIS — M5136 Other intervertebral disc degeneration, lumbar region: Secondary | ICD-10-CM | POA: Insufficient documentation

## 2018-04-02 DIAGNOSIS — N189 Chronic kidney disease, unspecified: Secondary | ICD-10-CM | POA: Diagnosis not present

## 2018-04-02 DIAGNOSIS — I10 Essential (primary) hypertension: Secondary | ICD-10-CM | POA: Diagnosis not present

## 2018-04-02 DIAGNOSIS — F4310 Post-traumatic stress disorder, unspecified: Secondary | ICD-10-CM | POA: Diagnosis not present

## 2018-04-03 DIAGNOSIS — I509 Heart failure, unspecified: Secondary | ICD-10-CM | POA: Diagnosis not present

## 2018-04-03 DIAGNOSIS — I10 Essential (primary) hypertension: Secondary | ICD-10-CM | POA: Diagnosis not present

## 2018-04-03 DIAGNOSIS — N189 Chronic kidney disease, unspecified: Secondary | ICD-10-CM | POA: Diagnosis not present

## 2018-04-03 DIAGNOSIS — N185 Chronic kidney disease, stage 5: Secondary | ICD-10-CM | POA: Diagnosis not present

## 2018-04-03 DIAGNOSIS — G629 Polyneuropathy, unspecified: Secondary | ICD-10-CM | POA: Diagnosis not present

## 2018-04-03 DIAGNOSIS — I120 Hypertensive chronic kidney disease with stage 5 chronic kidney disease or end stage renal disease: Secondary | ICD-10-CM | POA: Diagnosis not present

## 2018-04-03 DIAGNOSIS — M545 Low back pain: Secondary | ICD-10-CM | POA: Diagnosis not present

## 2018-04-03 DIAGNOSIS — E1122 Type 2 diabetes mellitus with diabetic chronic kidney disease: Secondary | ICD-10-CM | POA: Diagnosis not present

## 2018-04-03 DIAGNOSIS — E785 Hyperlipidemia, unspecified: Secondary | ICD-10-CM | POA: Diagnosis not present

## 2018-04-03 DIAGNOSIS — E1165 Type 2 diabetes mellitus with hyperglycemia: Secondary | ICD-10-CM | POA: Diagnosis not present

## 2018-04-03 DIAGNOSIS — F4310 Post-traumatic stress disorder, unspecified: Secondary | ICD-10-CM | POA: Diagnosis not present

## 2018-04-03 DIAGNOSIS — Z7984 Long term (current) use of oral hypoglycemic drugs: Secondary | ICD-10-CM | POA: Diagnosis not present

## 2018-04-03 DIAGNOSIS — D631 Anemia in chronic kidney disease: Secondary | ICD-10-CM | POA: Diagnosis not present

## 2018-04-04 DIAGNOSIS — E1165 Type 2 diabetes mellitus with hyperglycemia: Secondary | ICD-10-CM | POA: Diagnosis not present

## 2018-04-04 DIAGNOSIS — D631 Anemia in chronic kidney disease: Secondary | ICD-10-CM | POA: Diagnosis not present

## 2018-04-04 DIAGNOSIS — M545 Low back pain: Secondary | ICD-10-CM | POA: Diagnosis not present

## 2018-04-04 DIAGNOSIS — I6522 Occlusion and stenosis of left carotid artery: Secondary | ICD-10-CM | POA: Diagnosis not present

## 2018-04-04 DIAGNOSIS — E119 Type 2 diabetes mellitus without complications: Secondary | ICD-10-CM | POA: Diagnosis not present

## 2018-04-04 DIAGNOSIS — E785 Hyperlipidemia, unspecified: Secondary | ICD-10-CM | POA: Diagnosis not present

## 2018-04-04 DIAGNOSIS — I509 Heart failure, unspecified: Secondary | ICD-10-CM | POA: Diagnosis not present

## 2018-04-04 DIAGNOSIS — N185 Chronic kidney disease, stage 5: Secondary | ICD-10-CM | POA: Diagnosis not present

## 2018-04-04 DIAGNOSIS — N189 Chronic kidney disease, unspecified: Secondary | ICD-10-CM | POA: Diagnosis not present

## 2018-04-04 DIAGNOSIS — E1122 Type 2 diabetes mellitus with diabetic chronic kidney disease: Secondary | ICD-10-CM | POA: Diagnosis not present

## 2018-04-04 DIAGNOSIS — F4310 Post-traumatic stress disorder, unspecified: Secondary | ICD-10-CM | POA: Diagnosis not present

## 2018-04-04 DIAGNOSIS — I10 Essential (primary) hypertension: Secondary | ICD-10-CM | POA: Diagnosis not present

## 2018-04-04 DIAGNOSIS — Z7984 Long term (current) use of oral hypoglycemic drugs: Secondary | ICD-10-CM | POA: Diagnosis not present

## 2018-04-04 DIAGNOSIS — G629 Polyneuropathy, unspecified: Secondary | ICD-10-CM | POA: Diagnosis not present

## 2018-04-05 DIAGNOSIS — M545 Low back pain: Secondary | ICD-10-CM | POA: Diagnosis not present

## 2018-04-05 DIAGNOSIS — E1165 Type 2 diabetes mellitus with hyperglycemia: Secondary | ICD-10-CM | POA: Diagnosis not present

## 2018-04-05 DIAGNOSIS — N189 Chronic kidney disease, unspecified: Secondary | ICD-10-CM | POA: Diagnosis not present

## 2018-04-05 DIAGNOSIS — F4310 Post-traumatic stress disorder, unspecified: Secondary | ICD-10-CM | POA: Diagnosis not present

## 2018-04-05 DIAGNOSIS — I10 Essential (primary) hypertension: Secondary | ICD-10-CM | POA: Diagnosis not present

## 2018-04-06 DIAGNOSIS — M545 Low back pain: Secondary | ICD-10-CM | POA: Diagnosis not present

## 2018-04-06 DIAGNOSIS — F4310 Post-traumatic stress disorder, unspecified: Secondary | ICD-10-CM | POA: Diagnosis not present

## 2018-04-06 DIAGNOSIS — N189 Chronic kidney disease, unspecified: Secondary | ICD-10-CM | POA: Diagnosis not present

## 2018-04-06 DIAGNOSIS — E1165 Type 2 diabetes mellitus with hyperglycemia: Secondary | ICD-10-CM | POA: Diagnosis not present

## 2018-04-06 DIAGNOSIS — I10 Essential (primary) hypertension: Secondary | ICD-10-CM | POA: Diagnosis not present

## 2018-04-07 DIAGNOSIS — L97521 Non-pressure chronic ulcer of other part of left foot limited to breakdown of skin: Secondary | ICD-10-CM | POA: Diagnosis not present

## 2018-04-07 DIAGNOSIS — M24575 Contracture, left foot: Secondary | ICD-10-CM | POA: Diagnosis not present

## 2018-04-07 DIAGNOSIS — L89891 Pressure ulcer of other site, stage 1: Secondary | ICD-10-CM | POA: Diagnosis not present

## 2018-04-07 DIAGNOSIS — M7752 Other enthesopathy of left foot: Secondary | ICD-10-CM | POA: Diagnosis not present

## 2018-04-07 DIAGNOSIS — M86172 Other acute osteomyelitis, left ankle and foot: Secondary | ICD-10-CM | POA: Diagnosis not present

## 2018-04-07 DIAGNOSIS — M79675 Pain in left toe(s): Secondary | ICD-10-CM | POA: Diagnosis not present

## 2018-04-07 DIAGNOSIS — B351 Tinea unguium: Secondary | ICD-10-CM | POA: Diagnosis not present

## 2018-04-07 DIAGNOSIS — M79674 Pain in right toe(s): Secondary | ICD-10-CM | POA: Diagnosis not present

## 2018-04-09 DIAGNOSIS — M545 Low back pain: Secondary | ICD-10-CM | POA: Diagnosis not present

## 2018-04-09 DIAGNOSIS — F4310 Post-traumatic stress disorder, unspecified: Secondary | ICD-10-CM | POA: Diagnosis not present

## 2018-04-09 DIAGNOSIS — I10 Essential (primary) hypertension: Secondary | ICD-10-CM | POA: Diagnosis not present

## 2018-04-09 DIAGNOSIS — N189 Chronic kidney disease, unspecified: Secondary | ICD-10-CM | POA: Diagnosis not present

## 2018-04-09 DIAGNOSIS — E1165 Type 2 diabetes mellitus with hyperglycemia: Secondary | ICD-10-CM | POA: Diagnosis not present

## 2018-04-10 DIAGNOSIS — I10 Essential (primary) hypertension: Secondary | ICD-10-CM | POA: Diagnosis not present

## 2018-04-10 DIAGNOSIS — M545 Low back pain: Secondary | ICD-10-CM | POA: Diagnosis not present

## 2018-04-10 DIAGNOSIS — F4310 Post-traumatic stress disorder, unspecified: Secondary | ICD-10-CM | POA: Diagnosis not present

## 2018-04-10 DIAGNOSIS — N189 Chronic kidney disease, unspecified: Secondary | ICD-10-CM | POA: Diagnosis not present

## 2018-04-10 DIAGNOSIS — E1165 Type 2 diabetes mellitus with hyperglycemia: Secondary | ICD-10-CM | POA: Diagnosis not present

## 2018-04-11 DIAGNOSIS — E1165 Type 2 diabetes mellitus with hyperglycemia: Secondary | ICD-10-CM | POA: Diagnosis not present

## 2018-04-11 DIAGNOSIS — I10 Essential (primary) hypertension: Secondary | ICD-10-CM | POA: Diagnosis not present

## 2018-04-11 DIAGNOSIS — F4310 Post-traumatic stress disorder, unspecified: Secondary | ICD-10-CM | POA: Diagnosis not present

## 2018-04-11 DIAGNOSIS — M545 Low back pain: Secondary | ICD-10-CM | POA: Diagnosis not present

## 2018-04-11 DIAGNOSIS — N189 Chronic kidney disease, unspecified: Secondary | ICD-10-CM | POA: Diagnosis not present

## 2018-04-12 DIAGNOSIS — F4310 Post-traumatic stress disorder, unspecified: Secondary | ICD-10-CM | POA: Diagnosis not present

## 2018-04-12 DIAGNOSIS — E1165 Type 2 diabetes mellitus with hyperglycemia: Secondary | ICD-10-CM | POA: Diagnosis not present

## 2018-04-12 DIAGNOSIS — N189 Chronic kidney disease, unspecified: Secondary | ICD-10-CM | POA: Diagnosis not present

## 2018-04-12 DIAGNOSIS — I10 Essential (primary) hypertension: Secondary | ICD-10-CM | POA: Diagnosis not present

## 2018-04-12 DIAGNOSIS — M545 Low back pain: Secondary | ICD-10-CM | POA: Diagnosis not present

## 2018-04-13 DIAGNOSIS — E1165 Type 2 diabetes mellitus with hyperglycemia: Secondary | ICD-10-CM | POA: Diagnosis not present

## 2018-04-13 DIAGNOSIS — F4310 Post-traumatic stress disorder, unspecified: Secondary | ICD-10-CM | POA: Diagnosis not present

## 2018-04-13 DIAGNOSIS — N189 Chronic kidney disease, unspecified: Secondary | ICD-10-CM | POA: Diagnosis not present

## 2018-04-13 DIAGNOSIS — M545 Low back pain: Secondary | ICD-10-CM | POA: Diagnosis not present

## 2018-04-13 DIAGNOSIS — I10 Essential (primary) hypertension: Secondary | ICD-10-CM | POA: Diagnosis not present

## 2018-04-16 DIAGNOSIS — E1165 Type 2 diabetes mellitus with hyperglycemia: Secondary | ICD-10-CM | POA: Diagnosis not present

## 2018-04-16 DIAGNOSIS — M545 Low back pain: Secondary | ICD-10-CM | POA: Diagnosis not present

## 2018-04-16 DIAGNOSIS — F4310 Post-traumatic stress disorder, unspecified: Secondary | ICD-10-CM | POA: Diagnosis not present

## 2018-04-16 DIAGNOSIS — N189 Chronic kidney disease, unspecified: Secondary | ICD-10-CM | POA: Diagnosis not present

## 2018-04-16 DIAGNOSIS — I10 Essential (primary) hypertension: Secondary | ICD-10-CM | POA: Diagnosis not present

## 2018-04-17 DIAGNOSIS — M79672 Pain in left foot: Secondary | ICD-10-CM | POA: Diagnosis not present

## 2018-04-17 DIAGNOSIS — B351 Tinea unguium: Secondary | ICD-10-CM | POA: Diagnosis not present

## 2018-04-17 DIAGNOSIS — E119 Type 2 diabetes mellitus without complications: Secondary | ICD-10-CM | POA: Diagnosis not present

## 2018-04-17 DIAGNOSIS — M79671 Pain in right foot: Secondary | ICD-10-CM | POA: Diagnosis not present

## 2018-04-17 DIAGNOSIS — M5136 Other intervertebral disc degeneration, lumbar region: Secondary | ICD-10-CM | POA: Diagnosis not present

## 2018-04-17 DIAGNOSIS — Z7984 Long term (current) use of oral hypoglycemic drugs: Secondary | ICD-10-CM | POA: Diagnosis not present

## 2018-04-17 DIAGNOSIS — L89891 Pressure ulcer of other site, stage 1: Secondary | ICD-10-CM | POA: Diagnosis not present

## 2018-04-17 DIAGNOSIS — L97521 Non-pressure chronic ulcer of other part of left foot limited to breakdown of skin: Secondary | ICD-10-CM | POA: Diagnosis not present

## 2018-04-17 DIAGNOSIS — G8929 Other chronic pain: Secondary | ICD-10-CM | POA: Diagnosis not present

## 2018-04-17 DIAGNOSIS — M79674 Pain in right toe(s): Secondary | ICD-10-CM | POA: Diagnosis not present

## 2018-04-17 DIAGNOSIS — M79675 Pain in left toe(s): Secondary | ICD-10-CM | POA: Diagnosis not present

## 2018-04-17 DIAGNOSIS — I10 Essential (primary) hypertension: Secondary | ICD-10-CM | POA: Diagnosis not present

## 2018-04-17 DIAGNOSIS — M24575 Contracture, left foot: Secondary | ICD-10-CM | POA: Diagnosis not present

## 2018-04-17 DIAGNOSIS — Z7982 Long term (current) use of aspirin: Secondary | ICD-10-CM | POA: Diagnosis not present

## 2018-04-17 DIAGNOSIS — M7752 Other enthesopathy of left foot: Secondary | ICD-10-CM | POA: Diagnosis not present

## 2018-04-17 DIAGNOSIS — M86172 Other acute osteomyelitis, left ankle and foot: Secondary | ICD-10-CM | POA: Diagnosis not present

## 2018-04-19 DIAGNOSIS — I10 Essential (primary) hypertension: Secondary | ICD-10-CM | POA: Diagnosis not present

## 2018-04-19 DIAGNOSIS — F4310 Post-traumatic stress disorder, unspecified: Secondary | ICD-10-CM | POA: Diagnosis not present

## 2018-04-19 DIAGNOSIS — E1165 Type 2 diabetes mellitus with hyperglycemia: Secondary | ICD-10-CM | POA: Diagnosis not present

## 2018-04-19 DIAGNOSIS — M545 Low back pain: Secondary | ICD-10-CM | POA: Diagnosis not present

## 2018-04-19 DIAGNOSIS — N189 Chronic kidney disease, unspecified: Secondary | ICD-10-CM | POA: Diagnosis not present

## 2018-04-20 DIAGNOSIS — M545 Low back pain: Secondary | ICD-10-CM | POA: Diagnosis not present

## 2018-04-20 DIAGNOSIS — I10 Essential (primary) hypertension: Secondary | ICD-10-CM | POA: Diagnosis not present

## 2018-04-20 DIAGNOSIS — E1165 Type 2 diabetes mellitus with hyperglycemia: Secondary | ICD-10-CM | POA: Diagnosis not present

## 2018-04-20 DIAGNOSIS — E119 Type 2 diabetes mellitus without complications: Secondary | ICD-10-CM | POA: Diagnosis not present

## 2018-04-20 DIAGNOSIS — N189 Chronic kidney disease, unspecified: Secondary | ICD-10-CM | POA: Diagnosis not present

## 2018-04-20 DIAGNOSIS — K219 Gastro-esophageal reflux disease without esophagitis: Secondary | ICD-10-CM | POA: Diagnosis not present

## 2018-04-20 DIAGNOSIS — K5900 Constipation, unspecified: Secondary | ICD-10-CM | POA: Diagnosis not present

## 2018-04-20 DIAGNOSIS — N185 Chronic kidney disease, stage 5: Secondary | ICD-10-CM | POA: Diagnosis not present

## 2018-04-20 DIAGNOSIS — R0981 Nasal congestion: Secondary | ICD-10-CM | POA: Diagnosis not present

## 2018-04-20 DIAGNOSIS — F4310 Post-traumatic stress disorder, unspecified: Secondary | ICD-10-CM | POA: Diagnosis not present

## 2018-04-23 DIAGNOSIS — M545 Low back pain: Secondary | ICD-10-CM | POA: Diagnosis not present

## 2018-04-23 DIAGNOSIS — M79672 Pain in left foot: Secondary | ICD-10-CM | POA: Diagnosis not present

## 2018-04-23 DIAGNOSIS — G894 Chronic pain syndrome: Secondary | ICD-10-CM | POA: Diagnosis not present

## 2018-04-23 DIAGNOSIS — R208 Other disturbances of skin sensation: Secondary | ICD-10-CM | POA: Diagnosis not present

## 2018-04-23 DIAGNOSIS — M79671 Pain in right foot: Secondary | ICD-10-CM | POA: Diagnosis not present

## 2018-04-24 DIAGNOSIS — M545 Low back pain: Secondary | ICD-10-CM | POA: Diagnosis not present

## 2018-04-24 DIAGNOSIS — I10 Essential (primary) hypertension: Secondary | ICD-10-CM | POA: Diagnosis not present

## 2018-04-24 DIAGNOSIS — E1165 Type 2 diabetes mellitus with hyperglycemia: Secondary | ICD-10-CM | POA: Diagnosis not present

## 2018-04-24 DIAGNOSIS — N189 Chronic kidney disease, unspecified: Secondary | ICD-10-CM | POA: Diagnosis not present

## 2018-04-24 DIAGNOSIS — F4310 Post-traumatic stress disorder, unspecified: Secondary | ICD-10-CM | POA: Diagnosis not present

## 2018-04-25 DIAGNOSIS — N189 Chronic kidney disease, unspecified: Secondary | ICD-10-CM | POA: Diagnosis not present

## 2018-04-25 DIAGNOSIS — E1165 Type 2 diabetes mellitus with hyperglycemia: Secondary | ICD-10-CM | POA: Diagnosis not present

## 2018-04-25 DIAGNOSIS — F4310 Post-traumatic stress disorder, unspecified: Secondary | ICD-10-CM | POA: Diagnosis not present

## 2018-04-25 DIAGNOSIS — I10 Essential (primary) hypertension: Secondary | ICD-10-CM | POA: Diagnosis not present

## 2018-04-25 DIAGNOSIS — M545 Low back pain: Secondary | ICD-10-CM | POA: Diagnosis not present

## 2018-04-26 DIAGNOSIS — N189 Chronic kidney disease, unspecified: Secondary | ICD-10-CM | POA: Diagnosis not present

## 2018-04-26 DIAGNOSIS — E1165 Type 2 diabetes mellitus with hyperglycemia: Secondary | ICD-10-CM | POA: Diagnosis not present

## 2018-04-26 DIAGNOSIS — I10 Essential (primary) hypertension: Secondary | ICD-10-CM | POA: Diagnosis not present

## 2018-04-26 DIAGNOSIS — F4310 Post-traumatic stress disorder, unspecified: Secondary | ICD-10-CM | POA: Diagnosis not present

## 2018-04-26 DIAGNOSIS — M545 Low back pain: Secondary | ICD-10-CM | POA: Diagnosis not present

## 2018-04-27 DIAGNOSIS — N189 Chronic kidney disease, unspecified: Secondary | ICD-10-CM | POA: Diagnosis not present

## 2018-04-27 DIAGNOSIS — E1165 Type 2 diabetes mellitus with hyperglycemia: Secondary | ICD-10-CM | POA: Diagnosis not present

## 2018-04-27 DIAGNOSIS — I10 Essential (primary) hypertension: Secondary | ICD-10-CM | POA: Diagnosis not present

## 2018-04-27 DIAGNOSIS — F4310 Post-traumatic stress disorder, unspecified: Secondary | ICD-10-CM | POA: Diagnosis not present

## 2018-04-27 DIAGNOSIS — M545 Low back pain: Secondary | ICD-10-CM | POA: Diagnosis not present

## 2018-04-30 DIAGNOSIS — F4310 Post-traumatic stress disorder, unspecified: Secondary | ICD-10-CM | POA: Diagnosis not present

## 2018-04-30 DIAGNOSIS — N189 Chronic kidney disease, unspecified: Secondary | ICD-10-CM | POA: Diagnosis not present

## 2018-04-30 DIAGNOSIS — E1165 Type 2 diabetes mellitus with hyperglycemia: Secondary | ICD-10-CM | POA: Diagnosis not present

## 2018-04-30 DIAGNOSIS — I10 Essential (primary) hypertension: Secondary | ICD-10-CM | POA: Diagnosis not present

## 2018-04-30 DIAGNOSIS — M545 Low back pain: Secondary | ICD-10-CM | POA: Diagnosis not present

## 2018-05-01 DIAGNOSIS — L89891 Pressure ulcer of other site, stage 1: Secondary | ICD-10-CM | POA: Diagnosis not present

## 2018-05-01 DIAGNOSIS — N189 Chronic kidney disease, unspecified: Secondary | ICD-10-CM | POA: Diagnosis not present

## 2018-05-01 DIAGNOSIS — F4310 Post-traumatic stress disorder, unspecified: Secondary | ICD-10-CM | POA: Diagnosis not present

## 2018-05-01 DIAGNOSIS — M5136 Other intervertebral disc degeneration, lumbar region: Secondary | ICD-10-CM | POA: Diagnosis not present

## 2018-05-01 DIAGNOSIS — G8929 Other chronic pain: Secondary | ICD-10-CM | POA: Diagnosis not present

## 2018-05-01 DIAGNOSIS — M79675 Pain in left toe(s): Secondary | ICD-10-CM | POA: Diagnosis not present

## 2018-05-01 DIAGNOSIS — M545 Low back pain: Secondary | ICD-10-CM | POA: Diagnosis not present

## 2018-05-01 DIAGNOSIS — M86172 Other acute osteomyelitis, left ankle and foot: Secondary | ICD-10-CM | POA: Diagnosis not present

## 2018-05-01 DIAGNOSIS — M79671 Pain in right foot: Secondary | ICD-10-CM | POA: Diagnosis not present

## 2018-05-01 DIAGNOSIS — M79672 Pain in left foot: Secondary | ICD-10-CM | POA: Diagnosis not present

## 2018-05-01 DIAGNOSIS — M24575 Contracture, left foot: Secondary | ICD-10-CM | POA: Diagnosis not present

## 2018-05-01 DIAGNOSIS — B351 Tinea unguium: Secondary | ICD-10-CM | POA: Diagnosis not present

## 2018-05-01 DIAGNOSIS — L97521 Non-pressure chronic ulcer of other part of left foot limited to breakdown of skin: Secondary | ICD-10-CM | POA: Diagnosis not present

## 2018-05-01 DIAGNOSIS — M79674 Pain in right toe(s): Secondary | ICD-10-CM | POA: Diagnosis not present

## 2018-05-01 DIAGNOSIS — I10 Essential (primary) hypertension: Secondary | ICD-10-CM | POA: Diagnosis not present

## 2018-05-01 DIAGNOSIS — E1165 Type 2 diabetes mellitus with hyperglycemia: Secondary | ICD-10-CM | POA: Diagnosis not present

## 2018-05-01 DIAGNOSIS — M7752 Other enthesopathy of left foot: Secondary | ICD-10-CM | POA: Diagnosis not present

## 2018-05-02 DIAGNOSIS — N189 Chronic kidney disease, unspecified: Secondary | ICD-10-CM | POA: Diagnosis not present

## 2018-05-02 DIAGNOSIS — E1165 Type 2 diabetes mellitus with hyperglycemia: Secondary | ICD-10-CM | POA: Diagnosis not present

## 2018-05-02 DIAGNOSIS — M545 Low back pain: Secondary | ICD-10-CM | POA: Diagnosis not present

## 2018-05-02 DIAGNOSIS — F4310 Post-traumatic stress disorder, unspecified: Secondary | ICD-10-CM | POA: Diagnosis not present

## 2018-05-02 DIAGNOSIS — I10 Essential (primary) hypertension: Secondary | ICD-10-CM | POA: Diagnosis not present

## 2018-05-03 DIAGNOSIS — F4310 Post-traumatic stress disorder, unspecified: Secondary | ICD-10-CM | POA: Diagnosis not present

## 2018-05-03 DIAGNOSIS — G894 Chronic pain syndrome: Secondary | ICD-10-CM | POA: Diagnosis not present

## 2018-05-03 DIAGNOSIS — M79671 Pain in right foot: Secondary | ICD-10-CM | POA: Diagnosis not present

## 2018-05-03 DIAGNOSIS — N189 Chronic kidney disease, unspecified: Secondary | ICD-10-CM | POA: Diagnosis not present

## 2018-05-03 DIAGNOSIS — M79672 Pain in left foot: Secondary | ICD-10-CM | POA: Diagnosis not present

## 2018-05-03 DIAGNOSIS — M545 Low back pain: Secondary | ICD-10-CM | POA: Diagnosis not present

## 2018-05-03 DIAGNOSIS — E1165 Type 2 diabetes mellitus with hyperglycemia: Secondary | ICD-10-CM | POA: Diagnosis not present

## 2018-05-03 DIAGNOSIS — I10 Essential (primary) hypertension: Secondary | ICD-10-CM | POA: Diagnosis not present

## 2018-05-03 DIAGNOSIS — M79604 Pain in right leg: Secondary | ICD-10-CM | POA: Diagnosis not present

## 2018-05-04 DIAGNOSIS — I10 Essential (primary) hypertension: Secondary | ICD-10-CM | POA: Diagnosis not present

## 2018-05-04 DIAGNOSIS — M545 Low back pain: Secondary | ICD-10-CM | POA: Diagnosis not present

## 2018-05-04 DIAGNOSIS — N189 Chronic kidney disease, unspecified: Secondary | ICD-10-CM | POA: Diagnosis not present

## 2018-05-04 DIAGNOSIS — F4310 Post-traumatic stress disorder, unspecified: Secondary | ICD-10-CM | POA: Diagnosis not present

## 2018-05-04 DIAGNOSIS — E1165 Type 2 diabetes mellitus with hyperglycemia: Secondary | ICD-10-CM | POA: Diagnosis not present

## 2018-05-07 DIAGNOSIS — F4310 Post-traumatic stress disorder, unspecified: Secondary | ICD-10-CM | POA: Diagnosis not present

## 2018-05-07 DIAGNOSIS — I10 Essential (primary) hypertension: Secondary | ICD-10-CM | POA: Diagnosis not present

## 2018-05-07 DIAGNOSIS — M545 Low back pain: Secondary | ICD-10-CM | POA: Diagnosis not present

## 2018-05-07 DIAGNOSIS — N189 Chronic kidney disease, unspecified: Secondary | ICD-10-CM | POA: Diagnosis not present

## 2018-05-07 DIAGNOSIS — E1165 Type 2 diabetes mellitus with hyperglycemia: Secondary | ICD-10-CM | POA: Diagnosis not present

## 2018-05-08 DIAGNOSIS — E1165 Type 2 diabetes mellitus with hyperglycemia: Secondary | ICD-10-CM | POA: Diagnosis not present

## 2018-05-08 DIAGNOSIS — M545 Low back pain: Secondary | ICD-10-CM | POA: Diagnosis not present

## 2018-05-08 DIAGNOSIS — N189 Chronic kidney disease, unspecified: Secondary | ICD-10-CM | POA: Diagnosis not present

## 2018-05-08 DIAGNOSIS — I10 Essential (primary) hypertension: Secondary | ICD-10-CM | POA: Diagnosis not present

## 2018-05-08 DIAGNOSIS — F4310 Post-traumatic stress disorder, unspecified: Secondary | ICD-10-CM | POA: Diagnosis not present

## 2018-05-09 DIAGNOSIS — N189 Chronic kidney disease, unspecified: Secondary | ICD-10-CM | POA: Diagnosis not present

## 2018-05-09 DIAGNOSIS — E1165 Type 2 diabetes mellitus with hyperglycemia: Secondary | ICD-10-CM | POA: Diagnosis not present

## 2018-05-09 DIAGNOSIS — F4310 Post-traumatic stress disorder, unspecified: Secondary | ICD-10-CM | POA: Diagnosis not present

## 2018-05-09 DIAGNOSIS — M545 Low back pain: Secondary | ICD-10-CM | POA: Diagnosis not present

## 2018-05-09 DIAGNOSIS — I10 Essential (primary) hypertension: Secondary | ICD-10-CM | POA: Diagnosis not present

## 2018-05-10 DIAGNOSIS — I10 Essential (primary) hypertension: Secondary | ICD-10-CM | POA: Diagnosis not present

## 2018-05-10 DIAGNOSIS — M545 Low back pain: Secondary | ICD-10-CM | POA: Diagnosis not present

## 2018-05-10 DIAGNOSIS — F4310 Post-traumatic stress disorder, unspecified: Secondary | ICD-10-CM | POA: Diagnosis not present

## 2018-05-10 DIAGNOSIS — N189 Chronic kidney disease, unspecified: Secondary | ICD-10-CM | POA: Diagnosis not present

## 2018-05-10 DIAGNOSIS — E1165 Type 2 diabetes mellitus with hyperglycemia: Secondary | ICD-10-CM | POA: Diagnosis not present

## 2018-05-11 DIAGNOSIS — M545 Low back pain: Secondary | ICD-10-CM | POA: Diagnosis not present

## 2018-05-11 DIAGNOSIS — F4310 Post-traumatic stress disorder, unspecified: Secondary | ICD-10-CM | POA: Diagnosis not present

## 2018-05-11 DIAGNOSIS — E1165 Type 2 diabetes mellitus with hyperglycemia: Secondary | ICD-10-CM | POA: Diagnosis not present

## 2018-05-11 DIAGNOSIS — N189 Chronic kidney disease, unspecified: Secondary | ICD-10-CM | POA: Diagnosis not present

## 2018-05-11 DIAGNOSIS — I10 Essential (primary) hypertension: Secondary | ICD-10-CM | POA: Diagnosis not present

## 2018-05-12 DIAGNOSIS — I10 Essential (primary) hypertension: Secondary | ICD-10-CM | POA: Diagnosis not present

## 2018-05-12 DIAGNOSIS — F4310 Post-traumatic stress disorder, unspecified: Secondary | ICD-10-CM | POA: Diagnosis not present

## 2018-05-12 DIAGNOSIS — E1165 Type 2 diabetes mellitus with hyperglycemia: Secondary | ICD-10-CM | POA: Diagnosis not present

## 2018-05-12 DIAGNOSIS — M545 Low back pain: Secondary | ICD-10-CM | POA: Diagnosis not present

## 2018-05-12 DIAGNOSIS — N189 Chronic kidney disease, unspecified: Secondary | ICD-10-CM | POA: Diagnosis not present

## 2018-05-14 DIAGNOSIS — F4310 Post-traumatic stress disorder, unspecified: Secondary | ICD-10-CM | POA: Diagnosis not present

## 2018-05-14 DIAGNOSIS — M545 Low back pain: Secondary | ICD-10-CM | POA: Diagnosis not present

## 2018-05-14 DIAGNOSIS — M79672 Pain in left foot: Secondary | ICD-10-CM | POA: Diagnosis not present

## 2018-05-14 DIAGNOSIS — N189 Chronic kidney disease, unspecified: Secondary | ICD-10-CM | POA: Diagnosis not present

## 2018-05-14 DIAGNOSIS — E1165 Type 2 diabetes mellitus with hyperglycemia: Secondary | ICD-10-CM | POA: Diagnosis not present

## 2018-05-14 DIAGNOSIS — R208 Other disturbances of skin sensation: Secondary | ICD-10-CM | POA: Diagnosis not present

## 2018-05-14 DIAGNOSIS — I10 Essential (primary) hypertension: Secondary | ICD-10-CM | POA: Diagnosis not present

## 2018-05-14 DIAGNOSIS — M79671 Pain in right foot: Secondary | ICD-10-CM | POA: Diagnosis not present

## 2018-05-14 DIAGNOSIS — G894 Chronic pain syndrome: Secondary | ICD-10-CM | POA: Diagnosis not present

## 2018-05-15 DIAGNOSIS — F4310 Post-traumatic stress disorder, unspecified: Secondary | ICD-10-CM | POA: Diagnosis not present

## 2018-05-15 DIAGNOSIS — E1165 Type 2 diabetes mellitus with hyperglycemia: Secondary | ICD-10-CM | POA: Diagnosis not present

## 2018-05-15 DIAGNOSIS — M545 Low back pain: Secondary | ICD-10-CM | POA: Diagnosis not present

## 2018-05-15 DIAGNOSIS — I10 Essential (primary) hypertension: Secondary | ICD-10-CM | POA: Diagnosis not present

## 2018-05-15 DIAGNOSIS — N189 Chronic kidney disease, unspecified: Secondary | ICD-10-CM | POA: Diagnosis not present

## 2018-05-16 DIAGNOSIS — M545 Low back pain: Secondary | ICD-10-CM | POA: Diagnosis not present

## 2018-05-16 DIAGNOSIS — E1165 Type 2 diabetes mellitus with hyperglycemia: Secondary | ICD-10-CM | POA: Diagnosis not present

## 2018-05-16 DIAGNOSIS — I10 Essential (primary) hypertension: Secondary | ICD-10-CM | POA: Diagnosis not present

## 2018-05-16 DIAGNOSIS — N189 Chronic kidney disease, unspecified: Secondary | ICD-10-CM | POA: Diagnosis not present

## 2018-05-16 DIAGNOSIS — F4310 Post-traumatic stress disorder, unspecified: Secondary | ICD-10-CM | POA: Diagnosis not present

## 2018-05-17 DIAGNOSIS — N189 Chronic kidney disease, unspecified: Secondary | ICD-10-CM | POA: Diagnosis not present

## 2018-05-17 DIAGNOSIS — E1165 Type 2 diabetes mellitus with hyperglycemia: Secondary | ICD-10-CM | POA: Diagnosis not present

## 2018-05-17 DIAGNOSIS — M545 Low back pain: Secondary | ICD-10-CM | POA: Diagnosis not present

## 2018-05-17 DIAGNOSIS — I10 Essential (primary) hypertension: Secondary | ICD-10-CM | POA: Diagnosis not present

## 2018-05-17 DIAGNOSIS — F4310 Post-traumatic stress disorder, unspecified: Secondary | ICD-10-CM | POA: Diagnosis not present

## 2018-05-18 DIAGNOSIS — N189 Chronic kidney disease, unspecified: Secondary | ICD-10-CM | POA: Diagnosis not present

## 2018-05-18 DIAGNOSIS — F4310 Post-traumatic stress disorder, unspecified: Secondary | ICD-10-CM | POA: Diagnosis not present

## 2018-05-18 DIAGNOSIS — E1165 Type 2 diabetes mellitus with hyperglycemia: Secondary | ICD-10-CM | POA: Diagnosis not present

## 2018-05-18 DIAGNOSIS — I10 Essential (primary) hypertension: Secondary | ICD-10-CM | POA: Diagnosis not present

## 2018-05-18 DIAGNOSIS — M545 Low back pain: Secondary | ICD-10-CM | POA: Diagnosis not present

## 2018-05-19 DIAGNOSIS — I10 Essential (primary) hypertension: Secondary | ICD-10-CM | POA: Diagnosis not present

## 2018-05-19 DIAGNOSIS — E1165 Type 2 diabetes mellitus with hyperglycemia: Secondary | ICD-10-CM | POA: Diagnosis not present

## 2018-05-19 DIAGNOSIS — N189 Chronic kidney disease, unspecified: Secondary | ICD-10-CM | POA: Diagnosis not present

## 2018-05-19 DIAGNOSIS — M545 Low back pain: Secondary | ICD-10-CM | POA: Diagnosis not present

## 2018-05-19 DIAGNOSIS — F4310 Post-traumatic stress disorder, unspecified: Secondary | ICD-10-CM | POA: Diagnosis not present

## 2018-05-21 DIAGNOSIS — M24575 Contracture, left foot: Secondary | ICD-10-CM | POA: Diagnosis not present

## 2018-05-21 DIAGNOSIS — I10 Essential (primary) hypertension: Secondary | ICD-10-CM | POA: Diagnosis not present

## 2018-05-21 DIAGNOSIS — M545 Low back pain: Secondary | ICD-10-CM | POA: Diagnosis not present

## 2018-05-21 DIAGNOSIS — M79675 Pain in left toe(s): Secondary | ICD-10-CM | POA: Diagnosis not present

## 2018-05-21 DIAGNOSIS — M86172 Other acute osteomyelitis, left ankle and foot: Secondary | ICD-10-CM | POA: Diagnosis not present

## 2018-05-21 DIAGNOSIS — M79674 Pain in right toe(s): Secondary | ICD-10-CM | POA: Diagnosis not present

## 2018-05-21 DIAGNOSIS — L89891 Pressure ulcer of other site, stage 1: Secondary | ICD-10-CM | POA: Diagnosis not present

## 2018-05-21 DIAGNOSIS — E1165 Type 2 diabetes mellitus with hyperglycemia: Secondary | ICD-10-CM | POA: Diagnosis not present

## 2018-05-21 DIAGNOSIS — M7752 Other enthesopathy of left foot: Secondary | ICD-10-CM | POA: Diagnosis not present

## 2018-05-21 DIAGNOSIS — F4310 Post-traumatic stress disorder, unspecified: Secondary | ICD-10-CM | POA: Diagnosis not present

## 2018-05-21 DIAGNOSIS — N189 Chronic kidney disease, unspecified: Secondary | ICD-10-CM | POA: Diagnosis not present

## 2018-05-21 DIAGNOSIS — L97521 Non-pressure chronic ulcer of other part of left foot limited to breakdown of skin: Secondary | ICD-10-CM | POA: Diagnosis not present

## 2018-05-21 DIAGNOSIS — B351 Tinea unguium: Secondary | ICD-10-CM | POA: Diagnosis not present

## 2018-05-22 DIAGNOSIS — D631 Anemia in chronic kidney disease: Secondary | ICD-10-CM | POA: Diagnosis not present

## 2018-05-22 DIAGNOSIS — M545 Low back pain: Secondary | ICD-10-CM | POA: Diagnosis not present

## 2018-05-22 DIAGNOSIS — F4310 Post-traumatic stress disorder, unspecified: Secondary | ICD-10-CM | POA: Diagnosis not present

## 2018-05-22 DIAGNOSIS — E1165 Type 2 diabetes mellitus with hyperglycemia: Secondary | ICD-10-CM | POA: Diagnosis not present

## 2018-05-22 DIAGNOSIS — N189 Chronic kidney disease, unspecified: Secondary | ICD-10-CM | POA: Diagnosis not present

## 2018-05-22 DIAGNOSIS — N184 Chronic kidney disease, stage 4 (severe): Secondary | ICD-10-CM | POA: Diagnosis not present

## 2018-05-22 DIAGNOSIS — I10 Essential (primary) hypertension: Secondary | ICD-10-CM | POA: Diagnosis not present

## 2018-05-23 DIAGNOSIS — E1165 Type 2 diabetes mellitus with hyperglycemia: Secondary | ICD-10-CM | POA: Diagnosis not present

## 2018-05-23 DIAGNOSIS — F4310 Post-traumatic stress disorder, unspecified: Secondary | ICD-10-CM | POA: Diagnosis not present

## 2018-05-23 DIAGNOSIS — N189 Chronic kidney disease, unspecified: Secondary | ICD-10-CM | POA: Diagnosis not present

## 2018-05-23 DIAGNOSIS — M545 Low back pain: Secondary | ICD-10-CM | POA: Diagnosis not present

## 2018-05-23 DIAGNOSIS — I10 Essential (primary) hypertension: Secondary | ICD-10-CM | POA: Diagnosis not present

## 2018-05-24 DIAGNOSIS — I10 Essential (primary) hypertension: Secondary | ICD-10-CM | POA: Diagnosis not present

## 2018-05-24 DIAGNOSIS — F4310 Post-traumatic stress disorder, unspecified: Secondary | ICD-10-CM | POA: Diagnosis not present

## 2018-05-24 DIAGNOSIS — N189 Chronic kidney disease, unspecified: Secondary | ICD-10-CM | POA: Diagnosis not present

## 2018-05-24 DIAGNOSIS — M545 Low back pain: Secondary | ICD-10-CM | POA: Diagnosis not present

## 2018-05-24 DIAGNOSIS — E1165 Type 2 diabetes mellitus with hyperglycemia: Secondary | ICD-10-CM | POA: Diagnosis not present

## 2018-05-25 DIAGNOSIS — M545 Low back pain: Secondary | ICD-10-CM | POA: Diagnosis not present

## 2018-05-25 DIAGNOSIS — N189 Chronic kidney disease, unspecified: Secondary | ICD-10-CM | POA: Diagnosis not present

## 2018-05-25 DIAGNOSIS — E1165 Type 2 diabetes mellitus with hyperglycemia: Secondary | ICD-10-CM | POA: Diagnosis not present

## 2018-05-25 DIAGNOSIS — I10 Essential (primary) hypertension: Secondary | ICD-10-CM | POA: Diagnosis not present

## 2018-05-25 DIAGNOSIS — F4310 Post-traumatic stress disorder, unspecified: Secondary | ICD-10-CM | POA: Diagnosis not present

## 2018-05-28 DIAGNOSIS — N189 Chronic kidney disease, unspecified: Secondary | ICD-10-CM | POA: Diagnosis not present

## 2018-05-28 DIAGNOSIS — I10 Essential (primary) hypertension: Secondary | ICD-10-CM | POA: Diagnosis not present

## 2018-05-28 DIAGNOSIS — F4310 Post-traumatic stress disorder, unspecified: Secondary | ICD-10-CM | POA: Diagnosis not present

## 2018-05-28 DIAGNOSIS — E1165 Type 2 diabetes mellitus with hyperglycemia: Secondary | ICD-10-CM | POA: Diagnosis not present

## 2018-05-28 DIAGNOSIS — M545 Low back pain: Secondary | ICD-10-CM | POA: Diagnosis not present

## 2018-05-29 DIAGNOSIS — I10 Essential (primary) hypertension: Secondary | ICD-10-CM | POA: Diagnosis not present

## 2018-05-29 DIAGNOSIS — F4310 Post-traumatic stress disorder, unspecified: Secondary | ICD-10-CM | POA: Diagnosis not present

## 2018-05-29 DIAGNOSIS — M545 Low back pain: Secondary | ICD-10-CM | POA: Diagnosis not present

## 2018-05-29 DIAGNOSIS — E1165 Type 2 diabetes mellitus with hyperglycemia: Secondary | ICD-10-CM | POA: Diagnosis not present

## 2018-05-29 DIAGNOSIS — N189 Chronic kidney disease, unspecified: Secondary | ICD-10-CM | POA: Diagnosis not present

## 2018-05-30 DIAGNOSIS — F4310 Post-traumatic stress disorder, unspecified: Secondary | ICD-10-CM | POA: Diagnosis not present

## 2018-05-30 DIAGNOSIS — N189 Chronic kidney disease, unspecified: Secondary | ICD-10-CM | POA: Diagnosis not present

## 2018-05-30 DIAGNOSIS — E1165 Type 2 diabetes mellitus with hyperglycemia: Secondary | ICD-10-CM | POA: Diagnosis not present

## 2018-05-30 DIAGNOSIS — M545 Low back pain: Secondary | ICD-10-CM | POA: Diagnosis not present

## 2018-05-30 DIAGNOSIS — I10 Essential (primary) hypertension: Secondary | ICD-10-CM | POA: Diagnosis not present

## 2018-05-31 DIAGNOSIS — N189 Chronic kidney disease, unspecified: Secondary | ICD-10-CM | POA: Diagnosis not present

## 2018-05-31 DIAGNOSIS — N184 Chronic kidney disease, stage 4 (severe): Secondary | ICD-10-CM | POA: Diagnosis not present

## 2018-05-31 DIAGNOSIS — I10 Essential (primary) hypertension: Secondary | ICD-10-CM | POA: Diagnosis not present

## 2018-05-31 DIAGNOSIS — D631 Anemia in chronic kidney disease: Secondary | ICD-10-CM | POA: Diagnosis not present

## 2018-05-31 DIAGNOSIS — E1165 Type 2 diabetes mellitus with hyperglycemia: Secondary | ICD-10-CM | POA: Diagnosis not present

## 2018-05-31 DIAGNOSIS — M545 Low back pain: Secondary | ICD-10-CM | POA: Diagnosis not present

## 2018-05-31 DIAGNOSIS — F4310 Post-traumatic stress disorder, unspecified: Secondary | ICD-10-CM | POA: Diagnosis not present

## 2018-06-01 DIAGNOSIS — E1165 Type 2 diabetes mellitus with hyperglycemia: Secondary | ICD-10-CM | POA: Diagnosis not present

## 2018-06-01 DIAGNOSIS — N189 Chronic kidney disease, unspecified: Secondary | ICD-10-CM | POA: Diagnosis not present

## 2018-06-01 DIAGNOSIS — I10 Essential (primary) hypertension: Secondary | ICD-10-CM | POA: Diagnosis not present

## 2018-06-01 DIAGNOSIS — F4310 Post-traumatic stress disorder, unspecified: Secondary | ICD-10-CM | POA: Diagnosis not present

## 2018-06-01 DIAGNOSIS — M545 Low back pain: Secondary | ICD-10-CM | POA: Diagnosis not present

## 2018-06-02 DIAGNOSIS — N189 Chronic kidney disease, unspecified: Secondary | ICD-10-CM | POA: Diagnosis not present

## 2018-06-02 DIAGNOSIS — E1165 Type 2 diabetes mellitus with hyperglycemia: Secondary | ICD-10-CM | POA: Diagnosis not present

## 2018-06-02 DIAGNOSIS — I10 Essential (primary) hypertension: Secondary | ICD-10-CM | POA: Diagnosis not present

## 2018-06-02 DIAGNOSIS — M545 Low back pain: Secondary | ICD-10-CM | POA: Diagnosis not present

## 2018-06-02 DIAGNOSIS — F4310 Post-traumatic stress disorder, unspecified: Secondary | ICD-10-CM | POA: Diagnosis not present

## 2018-06-04 DIAGNOSIS — M545 Low back pain: Secondary | ICD-10-CM | POA: Diagnosis not present

## 2018-06-04 DIAGNOSIS — I10 Essential (primary) hypertension: Secondary | ICD-10-CM | POA: Diagnosis not present

## 2018-06-04 DIAGNOSIS — E1165 Type 2 diabetes mellitus with hyperglycemia: Secondary | ICD-10-CM | POA: Diagnosis not present

## 2018-06-04 DIAGNOSIS — N189 Chronic kidney disease, unspecified: Secondary | ICD-10-CM | POA: Diagnosis not present

## 2018-06-04 DIAGNOSIS — F4310 Post-traumatic stress disorder, unspecified: Secondary | ICD-10-CM | POA: Diagnosis not present

## 2018-06-05 DIAGNOSIS — I10 Essential (primary) hypertension: Secondary | ICD-10-CM | POA: Diagnosis not present

## 2018-06-05 DIAGNOSIS — E1165 Type 2 diabetes mellitus with hyperglycemia: Secondary | ICD-10-CM | POA: Diagnosis not present

## 2018-06-05 DIAGNOSIS — M545 Low back pain: Secondary | ICD-10-CM | POA: Diagnosis not present

## 2018-06-05 DIAGNOSIS — N189 Chronic kidney disease, unspecified: Secondary | ICD-10-CM | POA: Diagnosis not present

## 2018-06-05 DIAGNOSIS — F4310 Post-traumatic stress disorder, unspecified: Secondary | ICD-10-CM | POA: Diagnosis not present

## 2018-06-06 DIAGNOSIS — N189 Chronic kidney disease, unspecified: Secondary | ICD-10-CM | POA: Diagnosis not present

## 2018-06-06 DIAGNOSIS — F4310 Post-traumatic stress disorder, unspecified: Secondary | ICD-10-CM | POA: Diagnosis not present

## 2018-06-06 DIAGNOSIS — I10 Essential (primary) hypertension: Secondary | ICD-10-CM | POA: Diagnosis not present

## 2018-06-06 DIAGNOSIS — M545 Low back pain: Secondary | ICD-10-CM | POA: Diagnosis not present

## 2018-06-06 DIAGNOSIS — E1165 Type 2 diabetes mellitus with hyperglycemia: Secondary | ICD-10-CM | POA: Diagnosis not present

## 2018-06-07 DIAGNOSIS — E1165 Type 2 diabetes mellitus with hyperglycemia: Secondary | ICD-10-CM | POA: Diagnosis not present

## 2018-06-07 DIAGNOSIS — M545 Low back pain: Secondary | ICD-10-CM | POA: Diagnosis not present

## 2018-06-07 DIAGNOSIS — N189 Chronic kidney disease, unspecified: Secondary | ICD-10-CM | POA: Diagnosis not present

## 2018-06-07 DIAGNOSIS — F4310 Post-traumatic stress disorder, unspecified: Secondary | ICD-10-CM | POA: Diagnosis not present

## 2018-06-07 DIAGNOSIS — I10 Essential (primary) hypertension: Secondary | ICD-10-CM | POA: Diagnosis not present

## 2018-06-08 DIAGNOSIS — F4310 Post-traumatic stress disorder, unspecified: Secondary | ICD-10-CM | POA: Diagnosis not present

## 2018-06-08 DIAGNOSIS — E1165 Type 2 diabetes mellitus with hyperglycemia: Secondary | ICD-10-CM | POA: Diagnosis not present

## 2018-06-08 DIAGNOSIS — N189 Chronic kidney disease, unspecified: Secondary | ICD-10-CM | POA: Diagnosis not present

## 2018-06-08 DIAGNOSIS — M545 Low back pain: Secondary | ICD-10-CM | POA: Diagnosis not present

## 2018-06-08 DIAGNOSIS — I10 Essential (primary) hypertension: Secondary | ICD-10-CM | POA: Diagnosis not present

## 2018-06-09 DIAGNOSIS — E1165 Type 2 diabetes mellitus with hyperglycemia: Secondary | ICD-10-CM | POA: Diagnosis not present

## 2018-06-09 DIAGNOSIS — F4310 Post-traumatic stress disorder, unspecified: Secondary | ICD-10-CM | POA: Diagnosis not present

## 2018-06-09 DIAGNOSIS — N189 Chronic kidney disease, unspecified: Secondary | ICD-10-CM | POA: Diagnosis not present

## 2018-06-09 DIAGNOSIS — M545 Low back pain: Secondary | ICD-10-CM | POA: Diagnosis not present

## 2018-06-09 DIAGNOSIS — I10 Essential (primary) hypertension: Secondary | ICD-10-CM | POA: Diagnosis not present

## 2018-06-11 DIAGNOSIS — M79672 Pain in left foot: Secondary | ICD-10-CM | POA: Diagnosis not present

## 2018-06-11 DIAGNOSIS — I10 Essential (primary) hypertension: Secondary | ICD-10-CM | POA: Diagnosis not present

## 2018-06-11 DIAGNOSIS — F4310 Post-traumatic stress disorder, unspecified: Secondary | ICD-10-CM | POA: Diagnosis not present

## 2018-06-11 DIAGNOSIS — M545 Low back pain: Secondary | ICD-10-CM | POA: Diagnosis not present

## 2018-06-11 DIAGNOSIS — R208 Other disturbances of skin sensation: Secondary | ICD-10-CM | POA: Diagnosis not present

## 2018-06-11 DIAGNOSIS — G894 Chronic pain syndrome: Secondary | ICD-10-CM | POA: Diagnosis not present

## 2018-06-11 DIAGNOSIS — E1165 Type 2 diabetes mellitus with hyperglycemia: Secondary | ICD-10-CM | POA: Diagnosis not present

## 2018-06-11 DIAGNOSIS — M79671 Pain in right foot: Secondary | ICD-10-CM | POA: Diagnosis not present

## 2018-06-11 DIAGNOSIS — N189 Chronic kidney disease, unspecified: Secondary | ICD-10-CM | POA: Diagnosis not present

## 2018-06-12 DIAGNOSIS — I10 Essential (primary) hypertension: Secondary | ICD-10-CM | POA: Diagnosis not present

## 2018-06-12 DIAGNOSIS — E1165 Type 2 diabetes mellitus with hyperglycemia: Secondary | ICD-10-CM | POA: Diagnosis not present

## 2018-06-12 DIAGNOSIS — N189 Chronic kidney disease, unspecified: Secondary | ICD-10-CM | POA: Diagnosis not present

## 2018-06-12 DIAGNOSIS — M545 Low back pain: Secondary | ICD-10-CM | POA: Diagnosis not present

## 2018-06-12 DIAGNOSIS — F4310 Post-traumatic stress disorder, unspecified: Secondary | ICD-10-CM | POA: Diagnosis not present

## 2018-06-13 DIAGNOSIS — M545 Low back pain: Secondary | ICD-10-CM | POA: Diagnosis not present

## 2018-06-13 DIAGNOSIS — I10 Essential (primary) hypertension: Secondary | ICD-10-CM | POA: Diagnosis not present

## 2018-06-13 DIAGNOSIS — N189 Chronic kidney disease, unspecified: Secondary | ICD-10-CM | POA: Diagnosis not present

## 2018-06-13 DIAGNOSIS — E1165 Type 2 diabetes mellitus with hyperglycemia: Secondary | ICD-10-CM | POA: Diagnosis not present

## 2018-06-13 DIAGNOSIS — F4310 Post-traumatic stress disorder, unspecified: Secondary | ICD-10-CM | POA: Diagnosis not present

## 2018-06-14 DIAGNOSIS — M545 Low back pain: Secondary | ICD-10-CM | POA: Diagnosis not present

## 2018-06-14 DIAGNOSIS — F4310 Post-traumatic stress disorder, unspecified: Secondary | ICD-10-CM | POA: Diagnosis not present

## 2018-06-14 DIAGNOSIS — E1165 Type 2 diabetes mellitus with hyperglycemia: Secondary | ICD-10-CM | POA: Diagnosis not present

## 2018-06-14 DIAGNOSIS — I10 Essential (primary) hypertension: Secondary | ICD-10-CM | POA: Diagnosis not present

## 2018-06-14 DIAGNOSIS — N189 Chronic kidney disease, unspecified: Secondary | ICD-10-CM | POA: Diagnosis not present

## 2018-06-15 DIAGNOSIS — N189 Chronic kidney disease, unspecified: Secondary | ICD-10-CM | POA: Diagnosis not present

## 2018-06-15 DIAGNOSIS — E119 Type 2 diabetes mellitus without complications: Secondary | ICD-10-CM | POA: Diagnosis not present

## 2018-06-15 DIAGNOSIS — E1165 Type 2 diabetes mellitus with hyperglycemia: Secondary | ICD-10-CM | POA: Diagnosis not present

## 2018-06-15 DIAGNOSIS — M545 Low back pain: Secondary | ICD-10-CM | POA: Diagnosis not present

## 2018-06-15 DIAGNOSIS — F4310 Post-traumatic stress disorder, unspecified: Secondary | ICD-10-CM | POA: Diagnosis not present

## 2018-06-15 DIAGNOSIS — I10 Essential (primary) hypertension: Secondary | ICD-10-CM | POA: Diagnosis not present

## 2018-06-15 DIAGNOSIS — M549 Dorsalgia, unspecified: Secondary | ICD-10-CM | POA: Diagnosis not present

## 2018-06-16 DIAGNOSIS — I10 Essential (primary) hypertension: Secondary | ICD-10-CM | POA: Diagnosis not present

## 2018-06-16 DIAGNOSIS — N189 Chronic kidney disease, unspecified: Secondary | ICD-10-CM | POA: Diagnosis not present

## 2018-06-16 DIAGNOSIS — M545 Low back pain: Secondary | ICD-10-CM | POA: Diagnosis not present

## 2018-06-16 DIAGNOSIS — E1151 Type 2 diabetes mellitus with diabetic peripheral angiopathy without gangrene: Secondary | ICD-10-CM | POA: Diagnosis not present

## 2018-06-16 DIAGNOSIS — M24575 Contracture, left foot: Secondary | ICD-10-CM | POA: Diagnosis not present

## 2018-06-16 DIAGNOSIS — E1165 Type 2 diabetes mellitus with hyperglycemia: Secondary | ICD-10-CM | POA: Diagnosis not present

## 2018-06-16 DIAGNOSIS — F4310 Post-traumatic stress disorder, unspecified: Secondary | ICD-10-CM | POA: Diagnosis not present

## 2018-06-16 DIAGNOSIS — M24572 Contracture, left ankle: Secondary | ICD-10-CM | POA: Diagnosis not present

## 2018-06-16 DIAGNOSIS — M79672 Pain in left foot: Secondary | ICD-10-CM | POA: Diagnosis not present

## 2018-06-16 DIAGNOSIS — L89890 Pressure ulcer of other site, unstageable: Secondary | ICD-10-CM | POA: Diagnosis not present

## 2018-06-17 DIAGNOSIS — N189 Chronic kidney disease, unspecified: Secondary | ICD-10-CM | POA: Diagnosis not present

## 2018-06-17 DIAGNOSIS — F4310 Post-traumatic stress disorder, unspecified: Secondary | ICD-10-CM | POA: Diagnosis not present

## 2018-06-17 DIAGNOSIS — E1165 Type 2 diabetes mellitus with hyperglycemia: Secondary | ICD-10-CM | POA: Diagnosis not present

## 2018-06-17 DIAGNOSIS — I10 Essential (primary) hypertension: Secondary | ICD-10-CM | POA: Diagnosis not present

## 2018-06-17 DIAGNOSIS — M545 Low back pain: Secondary | ICD-10-CM | POA: Diagnosis not present

## 2018-06-18 DIAGNOSIS — I10 Essential (primary) hypertension: Secondary | ICD-10-CM | POA: Diagnosis not present

## 2018-06-18 DIAGNOSIS — N189 Chronic kidney disease, unspecified: Secondary | ICD-10-CM | POA: Diagnosis not present

## 2018-06-18 DIAGNOSIS — F4310 Post-traumatic stress disorder, unspecified: Secondary | ICD-10-CM | POA: Diagnosis not present

## 2018-06-18 DIAGNOSIS — M545 Low back pain: Secondary | ICD-10-CM | POA: Diagnosis not present

## 2018-06-18 DIAGNOSIS — E1165 Type 2 diabetes mellitus with hyperglycemia: Secondary | ICD-10-CM | POA: Diagnosis not present

## 2018-06-19 DIAGNOSIS — M545 Low back pain: Secondary | ICD-10-CM | POA: Diagnosis not present

## 2018-06-19 DIAGNOSIS — I10 Essential (primary) hypertension: Secondary | ICD-10-CM | POA: Diagnosis not present

## 2018-06-19 DIAGNOSIS — F4310 Post-traumatic stress disorder, unspecified: Secondary | ICD-10-CM | POA: Diagnosis not present

## 2018-06-19 DIAGNOSIS — E1165 Type 2 diabetes mellitus with hyperglycemia: Secondary | ICD-10-CM | POA: Diagnosis not present

## 2018-06-19 DIAGNOSIS — N189 Chronic kidney disease, unspecified: Secondary | ICD-10-CM | POA: Diagnosis not present

## 2018-06-20 DIAGNOSIS — E1165 Type 2 diabetes mellitus with hyperglycemia: Secondary | ICD-10-CM | POA: Diagnosis not present

## 2018-06-20 DIAGNOSIS — I10 Essential (primary) hypertension: Secondary | ICD-10-CM | POA: Diagnosis not present

## 2018-06-20 DIAGNOSIS — F4310 Post-traumatic stress disorder, unspecified: Secondary | ICD-10-CM | POA: Diagnosis not present

## 2018-06-20 DIAGNOSIS — M545 Low back pain: Secondary | ICD-10-CM | POA: Diagnosis not present

## 2018-06-20 DIAGNOSIS — N189 Chronic kidney disease, unspecified: Secondary | ICD-10-CM | POA: Diagnosis not present

## 2018-06-21 DIAGNOSIS — I10 Essential (primary) hypertension: Secondary | ICD-10-CM | POA: Diagnosis not present

## 2018-06-21 DIAGNOSIS — M545 Low back pain: Secondary | ICD-10-CM | POA: Diagnosis not present

## 2018-06-21 DIAGNOSIS — D631 Anemia in chronic kidney disease: Secondary | ICD-10-CM | POA: Diagnosis not present

## 2018-06-21 DIAGNOSIS — F4310 Post-traumatic stress disorder, unspecified: Secondary | ICD-10-CM | POA: Diagnosis not present

## 2018-06-21 DIAGNOSIS — N184 Chronic kidney disease, stage 4 (severe): Secondary | ICD-10-CM | POA: Diagnosis not present

## 2018-06-21 DIAGNOSIS — E1165 Type 2 diabetes mellitus with hyperglycemia: Secondary | ICD-10-CM | POA: Diagnosis not present

## 2018-06-21 DIAGNOSIS — N189 Chronic kidney disease, unspecified: Secondary | ICD-10-CM | POA: Diagnosis not present

## 2018-06-22 DIAGNOSIS — M545 Low back pain: Secondary | ICD-10-CM | POA: Diagnosis not present

## 2018-06-22 DIAGNOSIS — E1165 Type 2 diabetes mellitus with hyperglycemia: Secondary | ICD-10-CM | POA: Diagnosis not present

## 2018-06-22 DIAGNOSIS — N189 Chronic kidney disease, unspecified: Secondary | ICD-10-CM | POA: Diagnosis not present

## 2018-06-22 DIAGNOSIS — H4010X0 Unspecified open-angle glaucoma, stage unspecified: Secondary | ICD-10-CM | POA: Diagnosis not present

## 2018-06-22 DIAGNOSIS — F4310 Post-traumatic stress disorder, unspecified: Secondary | ICD-10-CM | POA: Diagnosis not present

## 2018-06-22 DIAGNOSIS — I10 Essential (primary) hypertension: Secondary | ICD-10-CM | POA: Diagnosis not present

## 2018-06-23 DIAGNOSIS — E1165 Type 2 diabetes mellitus with hyperglycemia: Secondary | ICD-10-CM | POA: Diagnosis not present

## 2018-06-23 DIAGNOSIS — I10 Essential (primary) hypertension: Secondary | ICD-10-CM | POA: Diagnosis not present

## 2018-06-23 DIAGNOSIS — M545 Low back pain: Secondary | ICD-10-CM | POA: Diagnosis not present

## 2018-06-23 DIAGNOSIS — F4310 Post-traumatic stress disorder, unspecified: Secondary | ICD-10-CM | POA: Diagnosis not present

## 2018-06-23 DIAGNOSIS — N189 Chronic kidney disease, unspecified: Secondary | ICD-10-CM | POA: Diagnosis not present

## 2018-06-24 DIAGNOSIS — I10 Essential (primary) hypertension: Secondary | ICD-10-CM | POA: Diagnosis not present

## 2018-06-24 DIAGNOSIS — F4310 Post-traumatic stress disorder, unspecified: Secondary | ICD-10-CM | POA: Diagnosis not present

## 2018-06-24 DIAGNOSIS — N189 Chronic kidney disease, unspecified: Secondary | ICD-10-CM | POA: Diagnosis not present

## 2018-06-24 DIAGNOSIS — M545 Low back pain: Secondary | ICD-10-CM | POA: Diagnosis not present

## 2018-06-24 DIAGNOSIS — E1165 Type 2 diabetes mellitus with hyperglycemia: Secondary | ICD-10-CM | POA: Diagnosis not present

## 2018-06-25 DIAGNOSIS — I10 Essential (primary) hypertension: Secondary | ICD-10-CM | POA: Diagnosis not present

## 2018-06-25 DIAGNOSIS — F4310 Post-traumatic stress disorder, unspecified: Secondary | ICD-10-CM | POA: Diagnosis not present

## 2018-06-25 DIAGNOSIS — M545 Low back pain: Secondary | ICD-10-CM | POA: Diagnosis not present

## 2018-06-25 DIAGNOSIS — N189 Chronic kidney disease, unspecified: Secondary | ICD-10-CM | POA: Diagnosis not present

## 2018-06-25 DIAGNOSIS — E1165 Type 2 diabetes mellitus with hyperglycemia: Secondary | ICD-10-CM | POA: Diagnosis not present

## 2018-06-26 DIAGNOSIS — I6522 Occlusion and stenosis of left carotid artery: Secondary | ICD-10-CM | POA: Diagnosis not present

## 2018-06-26 DIAGNOSIS — E1122 Type 2 diabetes mellitus with diabetic chronic kidney disease: Secondary | ICD-10-CM | POA: Diagnosis not present

## 2018-06-26 DIAGNOSIS — E785 Hyperlipidemia, unspecified: Secondary | ICD-10-CM | POA: Diagnosis not present

## 2018-06-26 DIAGNOSIS — Z7984 Long term (current) use of oral hypoglycemic drugs: Secondary | ICD-10-CM | POA: Diagnosis not present

## 2018-06-26 DIAGNOSIS — M545 Low back pain: Secondary | ICD-10-CM | POA: Diagnosis not present

## 2018-06-26 DIAGNOSIS — I509 Heart failure, unspecified: Secondary | ICD-10-CM | POA: Diagnosis not present

## 2018-06-26 DIAGNOSIS — I10 Essential (primary) hypertension: Secondary | ICD-10-CM | POA: Diagnosis not present

## 2018-06-26 DIAGNOSIS — N189 Chronic kidney disease, unspecified: Secondary | ICD-10-CM | POA: Diagnosis not present

## 2018-06-26 DIAGNOSIS — F4310 Post-traumatic stress disorder, unspecified: Secondary | ICD-10-CM | POA: Diagnosis not present

## 2018-06-26 DIAGNOSIS — D631 Anemia in chronic kidney disease: Secondary | ICD-10-CM | POA: Diagnosis not present

## 2018-06-26 DIAGNOSIS — E119 Type 2 diabetes mellitus without complications: Secondary | ICD-10-CM | POA: Diagnosis not present

## 2018-06-26 DIAGNOSIS — N185 Chronic kidney disease, stage 5: Secondary | ICD-10-CM | POA: Diagnosis not present

## 2018-06-26 DIAGNOSIS — G629 Polyneuropathy, unspecified: Secondary | ICD-10-CM | POA: Diagnosis not present

## 2018-06-26 DIAGNOSIS — E1165 Type 2 diabetes mellitus with hyperglycemia: Secondary | ICD-10-CM | POA: Diagnosis not present

## 2018-06-27 DIAGNOSIS — I10 Essential (primary) hypertension: Secondary | ICD-10-CM | POA: Diagnosis not present

## 2018-06-27 DIAGNOSIS — E1165 Type 2 diabetes mellitus with hyperglycemia: Secondary | ICD-10-CM | POA: Diagnosis not present

## 2018-06-27 DIAGNOSIS — M545 Low back pain: Secondary | ICD-10-CM | POA: Diagnosis not present

## 2018-06-27 DIAGNOSIS — N189 Chronic kidney disease, unspecified: Secondary | ICD-10-CM | POA: Diagnosis not present

## 2018-06-27 DIAGNOSIS — F4310 Post-traumatic stress disorder, unspecified: Secondary | ICD-10-CM | POA: Diagnosis not present

## 2018-06-28 DIAGNOSIS — E1165 Type 2 diabetes mellitus with hyperglycemia: Secondary | ICD-10-CM | POA: Diagnosis not present

## 2018-06-28 DIAGNOSIS — N189 Chronic kidney disease, unspecified: Secondary | ICD-10-CM | POA: Diagnosis not present

## 2018-06-28 DIAGNOSIS — I10 Essential (primary) hypertension: Secondary | ICD-10-CM | POA: Diagnosis not present

## 2018-06-28 DIAGNOSIS — F4310 Post-traumatic stress disorder, unspecified: Secondary | ICD-10-CM | POA: Diagnosis not present

## 2018-06-28 DIAGNOSIS — M545 Low back pain: Secondary | ICD-10-CM | POA: Diagnosis not present

## 2018-06-29 DIAGNOSIS — M545 Low back pain: Secondary | ICD-10-CM | POA: Diagnosis not present

## 2018-06-29 DIAGNOSIS — E1165 Type 2 diabetes mellitus with hyperglycemia: Secondary | ICD-10-CM | POA: Diagnosis not present

## 2018-06-29 DIAGNOSIS — F4310 Post-traumatic stress disorder, unspecified: Secondary | ICD-10-CM | POA: Diagnosis not present

## 2018-06-29 DIAGNOSIS — N189 Chronic kidney disease, unspecified: Secondary | ICD-10-CM | POA: Diagnosis not present

## 2018-06-29 DIAGNOSIS — I10 Essential (primary) hypertension: Secondary | ICD-10-CM | POA: Diagnosis not present

## 2018-06-30 DIAGNOSIS — E1165 Type 2 diabetes mellitus with hyperglycemia: Secondary | ICD-10-CM | POA: Diagnosis not present

## 2018-06-30 DIAGNOSIS — M545 Low back pain: Secondary | ICD-10-CM | POA: Diagnosis not present

## 2018-06-30 DIAGNOSIS — I10 Essential (primary) hypertension: Secondary | ICD-10-CM | POA: Diagnosis not present

## 2018-06-30 DIAGNOSIS — F4310 Post-traumatic stress disorder, unspecified: Secondary | ICD-10-CM | POA: Diagnosis not present

## 2018-06-30 DIAGNOSIS — N189 Chronic kidney disease, unspecified: Secondary | ICD-10-CM | POA: Diagnosis not present

## 2018-07-01 DIAGNOSIS — M545 Low back pain: Secondary | ICD-10-CM | POA: Diagnosis not present

## 2018-07-01 DIAGNOSIS — F4310 Post-traumatic stress disorder, unspecified: Secondary | ICD-10-CM | POA: Diagnosis not present

## 2018-07-01 DIAGNOSIS — I10 Essential (primary) hypertension: Secondary | ICD-10-CM | POA: Diagnosis not present

## 2018-07-01 DIAGNOSIS — N189 Chronic kidney disease, unspecified: Secondary | ICD-10-CM | POA: Diagnosis not present

## 2018-07-01 DIAGNOSIS — E1165 Type 2 diabetes mellitus with hyperglycemia: Secondary | ICD-10-CM | POA: Diagnosis not present

## 2018-07-02 DIAGNOSIS — M545 Low back pain: Secondary | ICD-10-CM | POA: Diagnosis not present

## 2018-07-02 DIAGNOSIS — F4310 Post-traumatic stress disorder, unspecified: Secondary | ICD-10-CM | POA: Diagnosis not present

## 2018-07-02 DIAGNOSIS — I10 Essential (primary) hypertension: Secondary | ICD-10-CM | POA: Diagnosis not present

## 2018-07-02 DIAGNOSIS — E1165 Type 2 diabetes mellitus with hyperglycemia: Secondary | ICD-10-CM | POA: Diagnosis not present

## 2018-07-02 DIAGNOSIS — N189 Chronic kidney disease, unspecified: Secondary | ICD-10-CM | POA: Diagnosis not present

## 2018-07-03 DIAGNOSIS — N189 Chronic kidney disease, unspecified: Secondary | ICD-10-CM | POA: Diagnosis not present

## 2018-07-03 DIAGNOSIS — E1165 Type 2 diabetes mellitus with hyperglycemia: Secondary | ICD-10-CM | POA: Diagnosis not present

## 2018-07-03 DIAGNOSIS — F4310 Post-traumatic stress disorder, unspecified: Secondary | ICD-10-CM | POA: Diagnosis not present

## 2018-07-03 DIAGNOSIS — M545 Low back pain: Secondary | ICD-10-CM | POA: Diagnosis not present

## 2018-07-03 DIAGNOSIS — I10 Essential (primary) hypertension: Secondary | ICD-10-CM | POA: Diagnosis not present

## 2018-07-04 DIAGNOSIS — M545 Low back pain: Secondary | ICD-10-CM | POA: Diagnosis not present

## 2018-07-04 DIAGNOSIS — I10 Essential (primary) hypertension: Secondary | ICD-10-CM | POA: Diagnosis not present

## 2018-07-04 DIAGNOSIS — F4310 Post-traumatic stress disorder, unspecified: Secondary | ICD-10-CM | POA: Diagnosis not present

## 2018-07-04 DIAGNOSIS — E1165 Type 2 diabetes mellitus with hyperglycemia: Secondary | ICD-10-CM | POA: Diagnosis not present

## 2018-07-04 DIAGNOSIS — N189 Chronic kidney disease, unspecified: Secondary | ICD-10-CM | POA: Diagnosis not present

## 2018-07-05 DIAGNOSIS — M545 Low back pain: Secondary | ICD-10-CM | POA: Diagnosis not present

## 2018-07-05 DIAGNOSIS — I10 Essential (primary) hypertension: Secondary | ICD-10-CM | POA: Diagnosis not present

## 2018-07-05 DIAGNOSIS — N189 Chronic kidney disease, unspecified: Secondary | ICD-10-CM | POA: Diagnosis not present

## 2018-07-05 DIAGNOSIS — E1165 Type 2 diabetes mellitus with hyperglycemia: Secondary | ICD-10-CM | POA: Diagnosis not present

## 2018-07-05 DIAGNOSIS — F4310 Post-traumatic stress disorder, unspecified: Secondary | ICD-10-CM | POA: Diagnosis not present

## 2018-07-06 DIAGNOSIS — F4310 Post-traumatic stress disorder, unspecified: Secondary | ICD-10-CM | POA: Diagnosis not present

## 2018-07-06 DIAGNOSIS — N189 Chronic kidney disease, unspecified: Secondary | ICD-10-CM | POA: Diagnosis not present

## 2018-07-06 DIAGNOSIS — M545 Low back pain: Secondary | ICD-10-CM | POA: Diagnosis not present

## 2018-07-06 DIAGNOSIS — E1165 Type 2 diabetes mellitus with hyperglycemia: Secondary | ICD-10-CM | POA: Diagnosis not present

## 2018-07-06 DIAGNOSIS — I10 Essential (primary) hypertension: Secondary | ICD-10-CM | POA: Diagnosis not present

## 2018-07-09 DIAGNOSIS — E1165 Type 2 diabetes mellitus with hyperglycemia: Secondary | ICD-10-CM | POA: Diagnosis not present

## 2018-07-09 DIAGNOSIS — M545 Low back pain: Secondary | ICD-10-CM | POA: Diagnosis not present

## 2018-07-09 DIAGNOSIS — I10 Essential (primary) hypertension: Secondary | ICD-10-CM | POA: Diagnosis not present

## 2018-07-09 DIAGNOSIS — N189 Chronic kidney disease, unspecified: Secondary | ICD-10-CM | POA: Diagnosis not present

## 2018-07-09 DIAGNOSIS — F4310 Post-traumatic stress disorder, unspecified: Secondary | ICD-10-CM | POA: Diagnosis not present

## 2018-07-10 DIAGNOSIS — N189 Chronic kidney disease, unspecified: Secondary | ICD-10-CM | POA: Diagnosis not present

## 2018-07-10 DIAGNOSIS — F4310 Post-traumatic stress disorder, unspecified: Secondary | ICD-10-CM | POA: Diagnosis not present

## 2018-07-10 DIAGNOSIS — E1165 Type 2 diabetes mellitus with hyperglycemia: Secondary | ICD-10-CM | POA: Diagnosis not present

## 2018-07-10 DIAGNOSIS — M545 Low back pain: Secondary | ICD-10-CM | POA: Diagnosis not present

## 2018-07-10 DIAGNOSIS — I10 Essential (primary) hypertension: Secondary | ICD-10-CM | POA: Diagnosis not present

## 2018-07-11 DIAGNOSIS — E1165 Type 2 diabetes mellitus with hyperglycemia: Secondary | ICD-10-CM | POA: Diagnosis not present

## 2018-07-11 DIAGNOSIS — N189 Chronic kidney disease, unspecified: Secondary | ICD-10-CM | POA: Diagnosis not present

## 2018-07-11 DIAGNOSIS — M545 Low back pain: Secondary | ICD-10-CM | POA: Diagnosis not present

## 2018-07-11 DIAGNOSIS — I10 Essential (primary) hypertension: Secondary | ICD-10-CM | POA: Diagnosis not present

## 2018-07-11 DIAGNOSIS — F4310 Post-traumatic stress disorder, unspecified: Secondary | ICD-10-CM | POA: Diagnosis not present

## 2018-07-12 DIAGNOSIS — I10 Essential (primary) hypertension: Secondary | ICD-10-CM | POA: Diagnosis not present

## 2018-07-12 DIAGNOSIS — M79672 Pain in left foot: Secondary | ICD-10-CM | POA: Diagnosis not present

## 2018-07-12 DIAGNOSIS — G894 Chronic pain syndrome: Secondary | ICD-10-CM | POA: Diagnosis not present

## 2018-07-12 DIAGNOSIS — M545 Low back pain: Secondary | ICD-10-CM | POA: Diagnosis not present

## 2018-07-12 DIAGNOSIS — E1165 Type 2 diabetes mellitus with hyperglycemia: Secondary | ICD-10-CM | POA: Diagnosis not present

## 2018-07-12 DIAGNOSIS — N189 Chronic kidney disease, unspecified: Secondary | ICD-10-CM | POA: Diagnosis not present

## 2018-07-12 DIAGNOSIS — F4310 Post-traumatic stress disorder, unspecified: Secondary | ICD-10-CM | POA: Diagnosis not present

## 2018-07-12 DIAGNOSIS — M25572 Pain in left ankle and joints of left foot: Secondary | ICD-10-CM | POA: Diagnosis not present

## 2018-07-12 DIAGNOSIS — M79671 Pain in right foot: Secondary | ICD-10-CM | POA: Diagnosis not present

## 2018-07-13 DIAGNOSIS — N184 Chronic kidney disease, stage 4 (severe): Secondary | ICD-10-CM | POA: Diagnosis not present

## 2018-07-13 DIAGNOSIS — F4310 Post-traumatic stress disorder, unspecified: Secondary | ICD-10-CM | POA: Diagnosis not present

## 2018-07-13 DIAGNOSIS — I10 Essential (primary) hypertension: Secondary | ICD-10-CM | POA: Diagnosis not present

## 2018-07-13 DIAGNOSIS — E1165 Type 2 diabetes mellitus with hyperglycemia: Secondary | ICD-10-CM | POA: Diagnosis not present

## 2018-07-13 DIAGNOSIS — N189 Chronic kidney disease, unspecified: Secondary | ICD-10-CM | POA: Diagnosis not present

## 2018-07-13 DIAGNOSIS — D631 Anemia in chronic kidney disease: Secondary | ICD-10-CM | POA: Diagnosis not present

## 2018-07-13 DIAGNOSIS — M545 Low back pain: Secondary | ICD-10-CM | POA: Diagnosis not present

## 2018-07-14 DIAGNOSIS — E1165 Type 2 diabetes mellitus with hyperglycemia: Secondary | ICD-10-CM | POA: Diagnosis not present

## 2018-07-14 DIAGNOSIS — F4310 Post-traumatic stress disorder, unspecified: Secondary | ICD-10-CM | POA: Diagnosis not present

## 2018-07-14 DIAGNOSIS — I10 Essential (primary) hypertension: Secondary | ICD-10-CM | POA: Diagnosis not present

## 2018-07-14 DIAGNOSIS — N189 Chronic kidney disease, unspecified: Secondary | ICD-10-CM | POA: Diagnosis not present

## 2018-07-14 DIAGNOSIS — M545 Low back pain: Secondary | ICD-10-CM | POA: Diagnosis not present

## 2018-07-16 DIAGNOSIS — N189 Chronic kidney disease, unspecified: Secondary | ICD-10-CM | POA: Diagnosis not present

## 2018-07-16 DIAGNOSIS — B351 Tinea unguium: Secondary | ICD-10-CM | POA: Diagnosis not present

## 2018-07-16 DIAGNOSIS — L89890 Pressure ulcer of other site, unstageable: Secondary | ICD-10-CM | POA: Diagnosis not present

## 2018-07-16 DIAGNOSIS — I10 Essential (primary) hypertension: Secondary | ICD-10-CM | POA: Diagnosis not present

## 2018-07-16 DIAGNOSIS — E1165 Type 2 diabetes mellitus with hyperglycemia: Secondary | ICD-10-CM | POA: Diagnosis not present

## 2018-07-16 DIAGNOSIS — M79674 Pain in right toe(s): Secondary | ICD-10-CM | POA: Diagnosis not present

## 2018-07-16 DIAGNOSIS — M545 Low back pain: Secondary | ICD-10-CM | POA: Diagnosis not present

## 2018-07-16 DIAGNOSIS — M79675 Pain in left toe(s): Secondary | ICD-10-CM | POA: Diagnosis not present

## 2018-07-16 DIAGNOSIS — M79672 Pain in left foot: Secondary | ICD-10-CM | POA: Diagnosis not present

## 2018-07-16 DIAGNOSIS — F4310 Post-traumatic stress disorder, unspecified: Secondary | ICD-10-CM | POA: Diagnosis not present

## 2018-07-17 DIAGNOSIS — E1165 Type 2 diabetes mellitus with hyperglycemia: Secondary | ICD-10-CM | POA: Diagnosis not present

## 2018-07-17 DIAGNOSIS — M545 Low back pain: Secondary | ICD-10-CM | POA: Diagnosis not present

## 2018-07-17 DIAGNOSIS — I10 Essential (primary) hypertension: Secondary | ICD-10-CM | POA: Diagnosis not present

## 2018-07-17 DIAGNOSIS — F4310 Post-traumatic stress disorder, unspecified: Secondary | ICD-10-CM | POA: Diagnosis not present

## 2018-07-17 DIAGNOSIS — N189 Chronic kidney disease, unspecified: Secondary | ICD-10-CM | POA: Diagnosis not present

## 2018-07-18 DIAGNOSIS — I10 Essential (primary) hypertension: Secondary | ICD-10-CM | POA: Diagnosis not present

## 2018-07-18 DIAGNOSIS — M545 Low back pain: Secondary | ICD-10-CM | POA: Diagnosis not present

## 2018-07-18 DIAGNOSIS — F4310 Post-traumatic stress disorder, unspecified: Secondary | ICD-10-CM | POA: Diagnosis not present

## 2018-07-18 DIAGNOSIS — E1165 Type 2 diabetes mellitus with hyperglycemia: Secondary | ICD-10-CM | POA: Diagnosis not present

## 2018-07-18 DIAGNOSIS — N189 Chronic kidney disease, unspecified: Secondary | ICD-10-CM | POA: Diagnosis not present

## 2018-07-19 DIAGNOSIS — I10 Essential (primary) hypertension: Secondary | ICD-10-CM | POA: Diagnosis not present

## 2018-07-19 DIAGNOSIS — N189 Chronic kidney disease, unspecified: Secondary | ICD-10-CM | POA: Diagnosis not present

## 2018-07-19 DIAGNOSIS — E1165 Type 2 diabetes mellitus with hyperglycemia: Secondary | ICD-10-CM | POA: Diagnosis not present

## 2018-07-19 DIAGNOSIS — M545 Low back pain: Secondary | ICD-10-CM | POA: Diagnosis not present

## 2018-07-19 DIAGNOSIS — F4310 Post-traumatic stress disorder, unspecified: Secondary | ICD-10-CM | POA: Diagnosis not present

## 2018-07-20 DIAGNOSIS — E1165 Type 2 diabetes mellitus with hyperglycemia: Secondary | ICD-10-CM | POA: Diagnosis not present

## 2018-07-20 DIAGNOSIS — I10 Essential (primary) hypertension: Secondary | ICD-10-CM | POA: Diagnosis not present

## 2018-07-20 DIAGNOSIS — N189 Chronic kidney disease, unspecified: Secondary | ICD-10-CM | POA: Diagnosis not present

## 2018-07-20 DIAGNOSIS — M545 Low back pain: Secondary | ICD-10-CM | POA: Diagnosis not present

## 2018-07-20 DIAGNOSIS — F4310 Post-traumatic stress disorder, unspecified: Secondary | ICD-10-CM | POA: Diagnosis not present

## 2018-07-21 DIAGNOSIS — N189 Chronic kidney disease, unspecified: Secondary | ICD-10-CM | POA: Diagnosis not present

## 2018-07-21 DIAGNOSIS — E1165 Type 2 diabetes mellitus with hyperglycemia: Secondary | ICD-10-CM | POA: Diagnosis not present

## 2018-07-21 DIAGNOSIS — F4310 Post-traumatic stress disorder, unspecified: Secondary | ICD-10-CM | POA: Diagnosis not present

## 2018-07-21 DIAGNOSIS — I10 Essential (primary) hypertension: Secondary | ICD-10-CM | POA: Diagnosis not present

## 2018-07-21 DIAGNOSIS — M545 Low back pain: Secondary | ICD-10-CM | POA: Diagnosis not present

## 2018-07-27 DIAGNOSIS — E785 Hyperlipidemia, unspecified: Secondary | ICD-10-CM | POA: Diagnosis not present

## 2018-07-27 DIAGNOSIS — N184 Chronic kidney disease, stage 4 (severe): Secondary | ICD-10-CM | POA: Diagnosis not present

## 2018-07-27 DIAGNOSIS — N185 Chronic kidney disease, stage 5: Secondary | ICD-10-CM | POA: Diagnosis not present

## 2018-07-27 DIAGNOSIS — I6522 Occlusion and stenosis of left carotid artery: Secondary | ICD-10-CM | POA: Diagnosis not present

## 2018-07-27 DIAGNOSIS — G629 Polyneuropathy, unspecified: Secondary | ICD-10-CM | POA: Diagnosis not present

## 2018-07-27 DIAGNOSIS — E119 Type 2 diabetes mellitus without complications: Secondary | ICD-10-CM | POA: Diagnosis not present

## 2018-07-27 DIAGNOSIS — D631 Anemia in chronic kidney disease: Secondary | ICD-10-CM | POA: Diagnosis not present

## 2018-07-27 DIAGNOSIS — N189 Chronic kidney disease, unspecified: Secondary | ICD-10-CM | POA: Diagnosis not present

## 2018-07-27 DIAGNOSIS — I509 Heart failure, unspecified: Secondary | ICD-10-CM | POA: Diagnosis not present

## 2018-07-27 DIAGNOSIS — I10 Essential (primary) hypertension: Secondary | ICD-10-CM | POA: Diagnosis not present

## 2018-07-30 DIAGNOSIS — L89891 Pressure ulcer of other site, stage 1: Secondary | ICD-10-CM | POA: Diagnosis not present

## 2018-07-30 DIAGNOSIS — N189 Chronic kidney disease, unspecified: Secondary | ICD-10-CM | POA: Diagnosis not present

## 2018-07-30 DIAGNOSIS — L03116 Cellulitis of left lower limb: Secondary | ICD-10-CM | POA: Diagnosis not present

## 2018-07-30 DIAGNOSIS — I10 Essential (primary) hypertension: Secondary | ICD-10-CM | POA: Diagnosis not present

## 2018-07-30 DIAGNOSIS — M86172 Other acute osteomyelitis, left ankle and foot: Secondary | ICD-10-CM | POA: Diagnosis not present

## 2018-07-30 DIAGNOSIS — L02612 Cutaneous abscess of left foot: Secondary | ICD-10-CM | POA: Diagnosis not present

## 2018-07-30 DIAGNOSIS — F4310 Post-traumatic stress disorder, unspecified: Secondary | ICD-10-CM | POA: Diagnosis not present

## 2018-07-30 DIAGNOSIS — L89890 Pressure ulcer of other site, unstageable: Secondary | ICD-10-CM | POA: Diagnosis not present

## 2018-07-30 DIAGNOSIS — M79672 Pain in left foot: Secondary | ICD-10-CM | POA: Diagnosis not present

## 2018-07-30 DIAGNOSIS — E1165 Type 2 diabetes mellitus with hyperglycemia: Secondary | ICD-10-CM | POA: Diagnosis not present

## 2018-07-30 DIAGNOSIS — M545 Low back pain: Secondary | ICD-10-CM | POA: Diagnosis not present

## 2018-07-31 DIAGNOSIS — M545 Low back pain: Secondary | ICD-10-CM | POA: Diagnosis not present

## 2018-07-31 DIAGNOSIS — N189 Chronic kidney disease, unspecified: Secondary | ICD-10-CM | POA: Diagnosis not present

## 2018-07-31 DIAGNOSIS — E1165 Type 2 diabetes mellitus with hyperglycemia: Secondary | ICD-10-CM | POA: Diagnosis not present

## 2018-07-31 DIAGNOSIS — I10 Essential (primary) hypertension: Secondary | ICD-10-CM | POA: Diagnosis not present

## 2018-07-31 DIAGNOSIS — F4310 Post-traumatic stress disorder, unspecified: Secondary | ICD-10-CM | POA: Diagnosis not present

## 2018-08-01 DIAGNOSIS — N189 Chronic kidney disease, unspecified: Secondary | ICD-10-CM | POA: Diagnosis not present

## 2018-08-01 DIAGNOSIS — I10 Essential (primary) hypertension: Secondary | ICD-10-CM | POA: Diagnosis not present

## 2018-08-01 DIAGNOSIS — E1165 Type 2 diabetes mellitus with hyperglycemia: Secondary | ICD-10-CM | POA: Diagnosis not present

## 2018-08-01 DIAGNOSIS — M545 Low back pain: Secondary | ICD-10-CM | POA: Diagnosis not present

## 2018-08-01 DIAGNOSIS — F4310 Post-traumatic stress disorder, unspecified: Secondary | ICD-10-CM | POA: Diagnosis not present

## 2018-08-02 DIAGNOSIS — M545 Low back pain: Secondary | ICD-10-CM | POA: Diagnosis not present

## 2018-08-02 DIAGNOSIS — F4310 Post-traumatic stress disorder, unspecified: Secondary | ICD-10-CM | POA: Diagnosis not present

## 2018-08-02 DIAGNOSIS — N189 Chronic kidney disease, unspecified: Secondary | ICD-10-CM | POA: Diagnosis not present

## 2018-08-02 DIAGNOSIS — I10 Essential (primary) hypertension: Secondary | ICD-10-CM | POA: Diagnosis not present

## 2018-08-02 DIAGNOSIS — E1165 Type 2 diabetes mellitus with hyperglycemia: Secondary | ICD-10-CM | POA: Diagnosis not present

## 2018-08-03 DIAGNOSIS — F4310 Post-traumatic stress disorder, unspecified: Secondary | ICD-10-CM | POA: Diagnosis not present

## 2018-08-03 DIAGNOSIS — N189 Chronic kidney disease, unspecified: Secondary | ICD-10-CM | POA: Diagnosis not present

## 2018-08-03 DIAGNOSIS — I10 Essential (primary) hypertension: Secondary | ICD-10-CM | POA: Diagnosis not present

## 2018-08-03 DIAGNOSIS — M545 Low back pain: Secondary | ICD-10-CM | POA: Diagnosis not present

## 2018-08-03 DIAGNOSIS — E1165 Type 2 diabetes mellitus with hyperglycemia: Secondary | ICD-10-CM | POA: Diagnosis not present

## 2018-08-04 DIAGNOSIS — M545 Low back pain: Secondary | ICD-10-CM | POA: Diagnosis not present

## 2018-08-04 DIAGNOSIS — E1165 Type 2 diabetes mellitus with hyperglycemia: Secondary | ICD-10-CM | POA: Diagnosis not present

## 2018-08-04 DIAGNOSIS — F4310 Post-traumatic stress disorder, unspecified: Secondary | ICD-10-CM | POA: Diagnosis not present

## 2018-08-04 DIAGNOSIS — I10 Essential (primary) hypertension: Secondary | ICD-10-CM | POA: Diagnosis not present

## 2018-08-04 DIAGNOSIS — N189 Chronic kidney disease, unspecified: Secondary | ICD-10-CM | POA: Diagnosis not present

## 2018-08-06 DIAGNOSIS — E1165 Type 2 diabetes mellitus with hyperglycemia: Secondary | ICD-10-CM | POA: Diagnosis not present

## 2018-08-06 DIAGNOSIS — L03116 Cellulitis of left lower limb: Secondary | ICD-10-CM | POA: Diagnosis not present

## 2018-08-06 DIAGNOSIS — M545 Low back pain: Secondary | ICD-10-CM | POA: Diagnosis not present

## 2018-08-06 DIAGNOSIS — L97521 Non-pressure chronic ulcer of other part of left foot limited to breakdown of skin: Secondary | ICD-10-CM | POA: Diagnosis not present

## 2018-08-06 DIAGNOSIS — L89890 Pressure ulcer of other site, unstageable: Secondary | ICD-10-CM | POA: Diagnosis not present

## 2018-08-06 DIAGNOSIS — M86172 Other acute osteomyelitis, left ankle and foot: Secondary | ICD-10-CM | POA: Diagnosis not present

## 2018-08-06 DIAGNOSIS — I10 Essential (primary) hypertension: Secondary | ICD-10-CM | POA: Diagnosis not present

## 2018-08-06 DIAGNOSIS — F4310 Post-traumatic stress disorder, unspecified: Secondary | ICD-10-CM | POA: Diagnosis not present

## 2018-08-06 DIAGNOSIS — M79672 Pain in left foot: Secondary | ICD-10-CM | POA: Diagnosis not present

## 2018-08-06 DIAGNOSIS — N189 Chronic kidney disease, unspecified: Secondary | ICD-10-CM | POA: Diagnosis not present

## 2018-08-07 DIAGNOSIS — I10 Essential (primary) hypertension: Secondary | ICD-10-CM | POA: Diagnosis not present

## 2018-08-07 DIAGNOSIS — N189 Chronic kidney disease, unspecified: Secondary | ICD-10-CM | POA: Diagnosis not present

## 2018-08-07 DIAGNOSIS — M545 Low back pain: Secondary | ICD-10-CM | POA: Diagnosis not present

## 2018-08-07 DIAGNOSIS — F4310 Post-traumatic stress disorder, unspecified: Secondary | ICD-10-CM | POA: Diagnosis not present

## 2018-08-07 DIAGNOSIS — E1165 Type 2 diabetes mellitus with hyperglycemia: Secondary | ICD-10-CM | POA: Diagnosis not present

## 2018-08-08 DIAGNOSIS — F4310 Post-traumatic stress disorder, unspecified: Secondary | ICD-10-CM | POA: Diagnosis not present

## 2018-08-08 DIAGNOSIS — E1165 Type 2 diabetes mellitus with hyperglycemia: Secondary | ICD-10-CM | POA: Diagnosis not present

## 2018-08-08 DIAGNOSIS — I10 Essential (primary) hypertension: Secondary | ICD-10-CM | POA: Diagnosis not present

## 2018-08-08 DIAGNOSIS — N189 Chronic kidney disease, unspecified: Secondary | ICD-10-CM | POA: Diagnosis not present

## 2018-08-08 DIAGNOSIS — M545 Low back pain: Secondary | ICD-10-CM | POA: Diagnosis not present

## 2018-08-09 DIAGNOSIS — I10 Essential (primary) hypertension: Secondary | ICD-10-CM | POA: Diagnosis not present

## 2018-08-09 DIAGNOSIS — M545 Low back pain: Secondary | ICD-10-CM | POA: Diagnosis not present

## 2018-08-09 DIAGNOSIS — F4310 Post-traumatic stress disorder, unspecified: Secondary | ICD-10-CM | POA: Diagnosis not present

## 2018-08-09 DIAGNOSIS — N189 Chronic kidney disease, unspecified: Secondary | ICD-10-CM | POA: Diagnosis not present

## 2018-08-09 DIAGNOSIS — E1165 Type 2 diabetes mellitus with hyperglycemia: Secondary | ICD-10-CM | POA: Diagnosis not present

## 2018-08-10 DIAGNOSIS — J309 Allergic rhinitis, unspecified: Secondary | ICD-10-CM | POA: Diagnosis not present

## 2018-08-10 DIAGNOSIS — I10 Essential (primary) hypertension: Secondary | ICD-10-CM | POA: Diagnosis not present

## 2018-08-10 DIAGNOSIS — F4310 Post-traumatic stress disorder, unspecified: Secondary | ICD-10-CM | POA: Diagnosis not present

## 2018-08-10 DIAGNOSIS — E1165 Type 2 diabetes mellitus with hyperglycemia: Secondary | ICD-10-CM | POA: Diagnosis not present

## 2018-08-10 DIAGNOSIS — N189 Chronic kidney disease, unspecified: Secondary | ICD-10-CM | POA: Diagnosis not present

## 2018-08-10 DIAGNOSIS — M545 Low back pain: Secondary | ICD-10-CM | POA: Diagnosis not present

## 2018-08-11 DIAGNOSIS — E1165 Type 2 diabetes mellitus with hyperglycemia: Secondary | ICD-10-CM | POA: Diagnosis not present

## 2018-08-11 DIAGNOSIS — N189 Chronic kidney disease, unspecified: Secondary | ICD-10-CM | POA: Diagnosis not present

## 2018-08-11 DIAGNOSIS — I10 Essential (primary) hypertension: Secondary | ICD-10-CM | POA: Diagnosis not present

## 2018-08-11 DIAGNOSIS — F4310 Post-traumatic stress disorder, unspecified: Secondary | ICD-10-CM | POA: Diagnosis not present

## 2018-08-11 DIAGNOSIS — M545 Low back pain: Secondary | ICD-10-CM | POA: Diagnosis not present

## 2018-08-13 DIAGNOSIS — J309 Allergic rhinitis, unspecified: Secondary | ICD-10-CM | POA: Diagnosis not present

## 2018-08-13 DIAGNOSIS — N189 Chronic kidney disease, unspecified: Secondary | ICD-10-CM | POA: Diagnosis not present

## 2018-08-13 DIAGNOSIS — E1165 Type 2 diabetes mellitus with hyperglycemia: Secondary | ICD-10-CM | POA: Diagnosis not present

## 2018-08-13 DIAGNOSIS — I10 Essential (primary) hypertension: Secondary | ICD-10-CM | POA: Diagnosis not present

## 2018-08-13 DIAGNOSIS — F4310 Post-traumatic stress disorder, unspecified: Secondary | ICD-10-CM | POA: Diagnosis not present

## 2018-08-13 DIAGNOSIS — M545 Low back pain: Secondary | ICD-10-CM | POA: Diagnosis not present

## 2018-08-14 DIAGNOSIS — N189 Chronic kidney disease, unspecified: Secondary | ICD-10-CM | POA: Diagnosis not present

## 2018-08-14 DIAGNOSIS — I10 Essential (primary) hypertension: Secondary | ICD-10-CM | POA: Diagnosis not present

## 2018-08-14 DIAGNOSIS — M545 Low back pain: Secondary | ICD-10-CM | POA: Diagnosis not present

## 2018-08-14 DIAGNOSIS — J309 Allergic rhinitis, unspecified: Secondary | ICD-10-CM | POA: Diagnosis not present

## 2018-08-14 DIAGNOSIS — F4310 Post-traumatic stress disorder, unspecified: Secondary | ICD-10-CM | POA: Diagnosis not present

## 2018-08-14 DIAGNOSIS — E1165 Type 2 diabetes mellitus with hyperglycemia: Secondary | ICD-10-CM | POA: Diagnosis not present

## 2018-08-15 DIAGNOSIS — N189 Chronic kidney disease, unspecified: Secondary | ICD-10-CM | POA: Diagnosis not present

## 2018-08-15 DIAGNOSIS — I10 Essential (primary) hypertension: Secondary | ICD-10-CM | POA: Diagnosis not present

## 2018-08-15 DIAGNOSIS — F4310 Post-traumatic stress disorder, unspecified: Secondary | ICD-10-CM | POA: Diagnosis not present

## 2018-08-15 DIAGNOSIS — J309 Allergic rhinitis, unspecified: Secondary | ICD-10-CM | POA: Diagnosis not present

## 2018-08-15 DIAGNOSIS — M545 Low back pain: Secondary | ICD-10-CM | POA: Diagnosis not present

## 2018-08-15 DIAGNOSIS — E1165 Type 2 diabetes mellitus with hyperglycemia: Secondary | ICD-10-CM | POA: Diagnosis not present

## 2018-08-16 DIAGNOSIS — M545 Low back pain: Secondary | ICD-10-CM | POA: Diagnosis not present

## 2018-08-16 DIAGNOSIS — F4310 Post-traumatic stress disorder, unspecified: Secondary | ICD-10-CM | POA: Diagnosis not present

## 2018-08-16 DIAGNOSIS — I10 Essential (primary) hypertension: Secondary | ICD-10-CM | POA: Diagnosis not present

## 2018-08-16 DIAGNOSIS — E1165 Type 2 diabetes mellitus with hyperglycemia: Secondary | ICD-10-CM | POA: Diagnosis not present

## 2018-08-16 DIAGNOSIS — J309 Allergic rhinitis, unspecified: Secondary | ICD-10-CM | POA: Diagnosis not present

## 2018-08-16 DIAGNOSIS — N189 Chronic kidney disease, unspecified: Secondary | ICD-10-CM | POA: Diagnosis not present

## 2018-08-17 DIAGNOSIS — M545 Low back pain: Secondary | ICD-10-CM | POA: Diagnosis not present

## 2018-08-17 DIAGNOSIS — N189 Chronic kidney disease, unspecified: Secondary | ICD-10-CM | POA: Diagnosis not present

## 2018-08-17 DIAGNOSIS — E1165 Type 2 diabetes mellitus with hyperglycemia: Secondary | ICD-10-CM | POA: Diagnosis not present

## 2018-08-17 DIAGNOSIS — I10 Essential (primary) hypertension: Secondary | ICD-10-CM | POA: Diagnosis not present

## 2018-08-17 DIAGNOSIS — F4310 Post-traumatic stress disorder, unspecified: Secondary | ICD-10-CM | POA: Diagnosis not present

## 2018-08-17 DIAGNOSIS — J309 Allergic rhinitis, unspecified: Secondary | ICD-10-CM | POA: Diagnosis not present

## 2018-08-18 DIAGNOSIS — I10 Essential (primary) hypertension: Secondary | ICD-10-CM | POA: Diagnosis not present

## 2018-08-18 DIAGNOSIS — F4310 Post-traumatic stress disorder, unspecified: Secondary | ICD-10-CM | POA: Diagnosis not present

## 2018-08-18 DIAGNOSIS — E1165 Type 2 diabetes mellitus with hyperglycemia: Secondary | ICD-10-CM | POA: Diagnosis not present

## 2018-08-18 DIAGNOSIS — J309 Allergic rhinitis, unspecified: Secondary | ICD-10-CM | POA: Diagnosis not present

## 2018-08-18 DIAGNOSIS — N189 Chronic kidney disease, unspecified: Secondary | ICD-10-CM | POA: Diagnosis not present

## 2018-08-18 DIAGNOSIS — M545 Low back pain: Secondary | ICD-10-CM | POA: Diagnosis not present

## 2018-08-20 DIAGNOSIS — I10 Essential (primary) hypertension: Secondary | ICD-10-CM | POA: Diagnosis not present

## 2018-08-20 DIAGNOSIS — E1165 Type 2 diabetes mellitus with hyperglycemia: Secondary | ICD-10-CM | POA: Diagnosis not present

## 2018-08-20 DIAGNOSIS — F4310 Post-traumatic stress disorder, unspecified: Secondary | ICD-10-CM | POA: Diagnosis not present

## 2018-08-20 DIAGNOSIS — M545 Low back pain: Secondary | ICD-10-CM | POA: Diagnosis not present

## 2018-08-20 DIAGNOSIS — N189 Chronic kidney disease, unspecified: Secondary | ICD-10-CM | POA: Diagnosis not present

## 2018-08-21 DIAGNOSIS — I10 Essential (primary) hypertension: Secondary | ICD-10-CM | POA: Diagnosis not present

## 2018-08-21 DIAGNOSIS — E1165 Type 2 diabetes mellitus with hyperglycemia: Secondary | ICD-10-CM | POA: Diagnosis not present

## 2018-08-21 DIAGNOSIS — M545 Low back pain: Secondary | ICD-10-CM | POA: Diagnosis not present

## 2018-08-21 DIAGNOSIS — F4310 Post-traumatic stress disorder, unspecified: Secondary | ICD-10-CM | POA: Diagnosis not present

## 2018-08-21 DIAGNOSIS — N189 Chronic kidney disease, unspecified: Secondary | ICD-10-CM | POA: Diagnosis not present

## 2018-08-22 DIAGNOSIS — F4310 Post-traumatic stress disorder, unspecified: Secondary | ICD-10-CM | POA: Diagnosis not present

## 2018-08-22 DIAGNOSIS — E1165 Type 2 diabetes mellitus with hyperglycemia: Secondary | ICD-10-CM | POA: Diagnosis not present

## 2018-08-22 DIAGNOSIS — N189 Chronic kidney disease, unspecified: Secondary | ICD-10-CM | POA: Diagnosis not present

## 2018-08-22 DIAGNOSIS — I10 Essential (primary) hypertension: Secondary | ICD-10-CM | POA: Diagnosis not present

## 2018-08-22 DIAGNOSIS — M545 Low back pain: Secondary | ICD-10-CM | POA: Diagnosis not present

## 2018-08-23 DIAGNOSIS — I10 Essential (primary) hypertension: Secondary | ICD-10-CM | POA: Diagnosis not present

## 2018-08-23 DIAGNOSIS — F4310 Post-traumatic stress disorder, unspecified: Secondary | ICD-10-CM | POA: Diagnosis not present

## 2018-08-23 DIAGNOSIS — M545 Low back pain: Secondary | ICD-10-CM | POA: Diagnosis not present

## 2018-08-23 DIAGNOSIS — N189 Chronic kidney disease, unspecified: Secondary | ICD-10-CM | POA: Diagnosis not present

## 2018-08-23 DIAGNOSIS — E1165 Type 2 diabetes mellitus with hyperglycemia: Secondary | ICD-10-CM | POA: Diagnosis not present

## 2018-08-24 DIAGNOSIS — F4310 Post-traumatic stress disorder, unspecified: Secondary | ICD-10-CM | POA: Diagnosis not present

## 2018-08-24 DIAGNOSIS — E1165 Type 2 diabetes mellitus with hyperglycemia: Secondary | ICD-10-CM | POA: Diagnosis not present

## 2018-08-24 DIAGNOSIS — I10 Essential (primary) hypertension: Secondary | ICD-10-CM | POA: Diagnosis not present

## 2018-08-24 DIAGNOSIS — M545 Low back pain: Secondary | ICD-10-CM | POA: Diagnosis not present

## 2018-08-24 DIAGNOSIS — N189 Chronic kidney disease, unspecified: Secondary | ICD-10-CM | POA: Diagnosis not present

## 2018-08-25 DIAGNOSIS — N189 Chronic kidney disease, unspecified: Secondary | ICD-10-CM | POA: Diagnosis not present

## 2018-08-25 DIAGNOSIS — F4310 Post-traumatic stress disorder, unspecified: Secondary | ICD-10-CM | POA: Diagnosis not present

## 2018-08-25 DIAGNOSIS — E1165 Type 2 diabetes mellitus with hyperglycemia: Secondary | ICD-10-CM | POA: Diagnosis not present

## 2018-08-25 DIAGNOSIS — M545 Low back pain: Secondary | ICD-10-CM | POA: Diagnosis not present

## 2018-08-25 DIAGNOSIS — I10 Essential (primary) hypertension: Secondary | ICD-10-CM | POA: Diagnosis not present

## 2018-08-27 DIAGNOSIS — N189 Chronic kidney disease, unspecified: Secondary | ICD-10-CM | POA: Diagnosis not present

## 2018-08-27 DIAGNOSIS — M545 Low back pain: Secondary | ICD-10-CM | POA: Diagnosis not present

## 2018-08-27 DIAGNOSIS — E1165 Type 2 diabetes mellitus with hyperglycemia: Secondary | ICD-10-CM | POA: Diagnosis not present

## 2018-08-27 DIAGNOSIS — I10 Essential (primary) hypertension: Secondary | ICD-10-CM | POA: Diagnosis not present

## 2018-08-27 DIAGNOSIS — F4310 Post-traumatic stress disorder, unspecified: Secondary | ICD-10-CM | POA: Diagnosis not present

## 2018-08-28 DIAGNOSIS — I10 Essential (primary) hypertension: Secondary | ICD-10-CM | POA: Diagnosis not present

## 2018-08-28 DIAGNOSIS — N189 Chronic kidney disease, unspecified: Secondary | ICD-10-CM | POA: Diagnosis not present

## 2018-08-28 DIAGNOSIS — F4310 Post-traumatic stress disorder, unspecified: Secondary | ICD-10-CM | POA: Diagnosis not present

## 2018-08-28 DIAGNOSIS — E1165 Type 2 diabetes mellitus with hyperglycemia: Secondary | ICD-10-CM | POA: Diagnosis not present

## 2018-08-28 DIAGNOSIS — M545 Low back pain: Secondary | ICD-10-CM | POA: Diagnosis not present

## 2018-08-29 DIAGNOSIS — E1165 Type 2 diabetes mellitus with hyperglycemia: Secondary | ICD-10-CM | POA: Diagnosis not present

## 2018-08-29 DIAGNOSIS — M545 Low back pain: Secondary | ICD-10-CM | POA: Diagnosis not present

## 2018-08-29 DIAGNOSIS — I10 Essential (primary) hypertension: Secondary | ICD-10-CM | POA: Diagnosis not present

## 2018-08-29 DIAGNOSIS — F4310 Post-traumatic stress disorder, unspecified: Secondary | ICD-10-CM | POA: Diagnosis not present

## 2018-08-29 DIAGNOSIS — N189 Chronic kidney disease, unspecified: Secondary | ICD-10-CM | POA: Diagnosis not present

## 2018-08-30 DIAGNOSIS — E1165 Type 2 diabetes mellitus with hyperglycemia: Secondary | ICD-10-CM | POA: Diagnosis not present

## 2018-08-30 DIAGNOSIS — N189 Chronic kidney disease, unspecified: Secondary | ICD-10-CM | POA: Diagnosis not present

## 2018-08-30 DIAGNOSIS — M545 Low back pain: Secondary | ICD-10-CM | POA: Diagnosis not present

## 2018-08-30 DIAGNOSIS — F4310 Post-traumatic stress disorder, unspecified: Secondary | ICD-10-CM | POA: Diagnosis not present

## 2018-08-30 DIAGNOSIS — I10 Essential (primary) hypertension: Secondary | ICD-10-CM | POA: Diagnosis not present

## 2018-08-31 DIAGNOSIS — N189 Chronic kidney disease, unspecified: Secondary | ICD-10-CM | POA: Diagnosis not present

## 2018-08-31 DIAGNOSIS — M545 Low back pain: Secondary | ICD-10-CM | POA: Diagnosis not present

## 2018-08-31 DIAGNOSIS — E1165 Type 2 diabetes mellitus with hyperglycemia: Secondary | ICD-10-CM | POA: Diagnosis not present

## 2018-08-31 DIAGNOSIS — I10 Essential (primary) hypertension: Secondary | ICD-10-CM | POA: Diagnosis not present

## 2018-08-31 DIAGNOSIS — F4310 Post-traumatic stress disorder, unspecified: Secondary | ICD-10-CM | POA: Diagnosis not present

## 2018-09-01 DIAGNOSIS — I10 Essential (primary) hypertension: Secondary | ICD-10-CM | POA: Diagnosis not present

## 2018-09-01 DIAGNOSIS — F4310 Post-traumatic stress disorder, unspecified: Secondary | ICD-10-CM | POA: Diagnosis not present

## 2018-09-01 DIAGNOSIS — M545 Low back pain: Secondary | ICD-10-CM | POA: Diagnosis not present

## 2018-09-01 DIAGNOSIS — N189 Chronic kidney disease, unspecified: Secondary | ICD-10-CM | POA: Diagnosis not present

## 2018-09-01 DIAGNOSIS — E1165 Type 2 diabetes mellitus with hyperglycemia: Secondary | ICD-10-CM | POA: Diagnosis not present

## 2018-09-03 DIAGNOSIS — M545 Low back pain: Secondary | ICD-10-CM | POA: Diagnosis not present

## 2018-09-03 DIAGNOSIS — E1165 Type 2 diabetes mellitus with hyperglycemia: Secondary | ICD-10-CM | POA: Diagnosis not present

## 2018-09-03 DIAGNOSIS — N189 Chronic kidney disease, unspecified: Secondary | ICD-10-CM | POA: Diagnosis not present

## 2018-09-03 DIAGNOSIS — F4310 Post-traumatic stress disorder, unspecified: Secondary | ICD-10-CM | POA: Diagnosis not present

## 2018-09-03 DIAGNOSIS — I10 Essential (primary) hypertension: Secondary | ICD-10-CM | POA: Diagnosis not present

## 2018-09-04 DIAGNOSIS — F4310 Post-traumatic stress disorder, unspecified: Secondary | ICD-10-CM | POA: Diagnosis not present

## 2018-09-04 DIAGNOSIS — M545 Low back pain: Secondary | ICD-10-CM | POA: Diagnosis not present

## 2018-09-04 DIAGNOSIS — E1165 Type 2 diabetes mellitus with hyperglycemia: Secondary | ICD-10-CM | POA: Diagnosis not present

## 2018-09-04 DIAGNOSIS — I10 Essential (primary) hypertension: Secondary | ICD-10-CM | POA: Diagnosis not present

## 2018-09-04 DIAGNOSIS — N189 Chronic kidney disease, unspecified: Secondary | ICD-10-CM | POA: Diagnosis not present

## 2018-09-05 DIAGNOSIS — I10 Essential (primary) hypertension: Secondary | ICD-10-CM | POA: Diagnosis not present

## 2018-09-05 DIAGNOSIS — N189 Chronic kidney disease, unspecified: Secondary | ICD-10-CM | POA: Diagnosis not present

## 2018-09-05 DIAGNOSIS — M545 Low back pain: Secondary | ICD-10-CM | POA: Diagnosis not present

## 2018-09-05 DIAGNOSIS — F4310 Post-traumatic stress disorder, unspecified: Secondary | ICD-10-CM | POA: Diagnosis not present

## 2018-09-05 DIAGNOSIS — E1165 Type 2 diabetes mellitus with hyperglycemia: Secondary | ICD-10-CM | POA: Diagnosis not present

## 2018-09-06 DIAGNOSIS — E1165 Type 2 diabetes mellitus with hyperglycemia: Secondary | ICD-10-CM | POA: Diagnosis not present

## 2018-09-06 DIAGNOSIS — I10 Essential (primary) hypertension: Secondary | ICD-10-CM | POA: Diagnosis not present

## 2018-09-06 DIAGNOSIS — N189 Chronic kidney disease, unspecified: Secondary | ICD-10-CM | POA: Diagnosis not present

## 2018-09-06 DIAGNOSIS — M545 Low back pain: Secondary | ICD-10-CM | POA: Diagnosis not present

## 2018-09-06 DIAGNOSIS — F4310 Post-traumatic stress disorder, unspecified: Secondary | ICD-10-CM | POA: Diagnosis not present

## 2018-09-07 DIAGNOSIS — F4310 Post-traumatic stress disorder, unspecified: Secondary | ICD-10-CM | POA: Diagnosis not present

## 2018-09-07 DIAGNOSIS — E1165 Type 2 diabetes mellitus with hyperglycemia: Secondary | ICD-10-CM | POA: Diagnosis not present

## 2018-09-07 DIAGNOSIS — M79605 Pain in left leg: Secondary | ICD-10-CM | POA: Diagnosis not present

## 2018-09-07 DIAGNOSIS — I10 Essential (primary) hypertension: Secondary | ICD-10-CM | POA: Diagnosis not present

## 2018-09-07 DIAGNOSIS — N189 Chronic kidney disease, unspecified: Secondary | ICD-10-CM | POA: Diagnosis not present

## 2018-09-07 DIAGNOSIS — M79604 Pain in right leg: Secondary | ICD-10-CM | POA: Diagnosis not present

## 2018-09-07 DIAGNOSIS — M545 Low back pain: Secondary | ICD-10-CM | POA: Diagnosis not present

## 2018-09-07 DIAGNOSIS — M25572 Pain in left ankle and joints of left foot: Secondary | ICD-10-CM | POA: Diagnosis not present

## 2018-09-08 DIAGNOSIS — M545 Low back pain: Secondary | ICD-10-CM | POA: Diagnosis not present

## 2018-09-08 DIAGNOSIS — F4310 Post-traumatic stress disorder, unspecified: Secondary | ICD-10-CM | POA: Diagnosis not present

## 2018-09-08 DIAGNOSIS — E1165 Type 2 diabetes mellitus with hyperglycemia: Secondary | ICD-10-CM | POA: Diagnosis not present

## 2018-09-08 DIAGNOSIS — I10 Essential (primary) hypertension: Secondary | ICD-10-CM | POA: Diagnosis not present

## 2018-09-08 DIAGNOSIS — N189 Chronic kidney disease, unspecified: Secondary | ICD-10-CM | POA: Diagnosis not present

## 2018-09-10 DIAGNOSIS — N189 Chronic kidney disease, unspecified: Secondary | ICD-10-CM | POA: Diagnosis not present

## 2018-09-10 DIAGNOSIS — I10 Essential (primary) hypertension: Secondary | ICD-10-CM | POA: Diagnosis not present

## 2018-09-10 DIAGNOSIS — F4310 Post-traumatic stress disorder, unspecified: Secondary | ICD-10-CM | POA: Diagnosis not present

## 2018-09-10 DIAGNOSIS — M545 Low back pain: Secondary | ICD-10-CM | POA: Diagnosis not present

## 2018-09-10 DIAGNOSIS — E1165 Type 2 diabetes mellitus with hyperglycemia: Secondary | ICD-10-CM | POA: Diagnosis not present

## 2018-09-11 DIAGNOSIS — F4310 Post-traumatic stress disorder, unspecified: Secondary | ICD-10-CM | POA: Diagnosis not present

## 2018-09-11 DIAGNOSIS — E1165 Type 2 diabetes mellitus with hyperglycemia: Secondary | ICD-10-CM | POA: Diagnosis not present

## 2018-09-11 DIAGNOSIS — M545 Low back pain: Secondary | ICD-10-CM | POA: Diagnosis not present

## 2018-09-11 DIAGNOSIS — I10 Essential (primary) hypertension: Secondary | ICD-10-CM | POA: Diagnosis not present

## 2018-09-11 DIAGNOSIS — N189 Chronic kidney disease, unspecified: Secondary | ICD-10-CM | POA: Diagnosis not present

## 2018-09-12 DIAGNOSIS — F4310 Post-traumatic stress disorder, unspecified: Secondary | ICD-10-CM | POA: Diagnosis not present

## 2018-09-12 DIAGNOSIS — M545 Low back pain: Secondary | ICD-10-CM | POA: Diagnosis not present

## 2018-09-12 DIAGNOSIS — E1165 Type 2 diabetes mellitus with hyperglycemia: Secondary | ICD-10-CM | POA: Diagnosis not present

## 2018-09-12 DIAGNOSIS — N189 Chronic kidney disease, unspecified: Secondary | ICD-10-CM | POA: Diagnosis not present

## 2018-09-12 DIAGNOSIS — I10 Essential (primary) hypertension: Secondary | ICD-10-CM | POA: Diagnosis not present

## 2018-09-13 DIAGNOSIS — N189 Chronic kidney disease, unspecified: Secondary | ICD-10-CM | POA: Diagnosis not present

## 2018-09-13 DIAGNOSIS — M545 Low back pain: Secondary | ICD-10-CM | POA: Diagnosis not present

## 2018-09-13 DIAGNOSIS — E1165 Type 2 diabetes mellitus with hyperglycemia: Secondary | ICD-10-CM | POA: Diagnosis not present

## 2018-09-13 DIAGNOSIS — F4310 Post-traumatic stress disorder, unspecified: Secondary | ICD-10-CM | POA: Diagnosis not present

## 2018-09-13 DIAGNOSIS — I10 Essential (primary) hypertension: Secondary | ICD-10-CM | POA: Diagnosis not present

## 2018-09-14 DIAGNOSIS — M545 Low back pain: Secondary | ICD-10-CM | POA: Diagnosis not present

## 2018-09-14 DIAGNOSIS — F4310 Post-traumatic stress disorder, unspecified: Secondary | ICD-10-CM | POA: Diagnosis not present

## 2018-09-14 DIAGNOSIS — N189 Chronic kidney disease, unspecified: Secondary | ICD-10-CM | POA: Diagnosis not present

## 2018-09-14 DIAGNOSIS — I10 Essential (primary) hypertension: Secondary | ICD-10-CM | POA: Diagnosis not present

## 2018-09-14 DIAGNOSIS — E1165 Type 2 diabetes mellitus with hyperglycemia: Secondary | ICD-10-CM | POA: Diagnosis not present

## 2018-09-15 DIAGNOSIS — N189 Chronic kidney disease, unspecified: Secondary | ICD-10-CM | POA: Diagnosis not present

## 2018-09-15 DIAGNOSIS — I10 Essential (primary) hypertension: Secondary | ICD-10-CM | POA: Diagnosis not present

## 2018-09-15 DIAGNOSIS — F4310 Post-traumatic stress disorder, unspecified: Secondary | ICD-10-CM | POA: Diagnosis not present

## 2018-09-15 DIAGNOSIS — E1165 Type 2 diabetes mellitus with hyperglycemia: Secondary | ICD-10-CM | POA: Diagnosis not present

## 2018-09-15 DIAGNOSIS — M545 Low back pain: Secondary | ICD-10-CM | POA: Diagnosis not present

## 2018-09-17 DIAGNOSIS — N189 Chronic kidney disease, unspecified: Secondary | ICD-10-CM | POA: Diagnosis not present

## 2018-09-17 DIAGNOSIS — M545 Low back pain: Secondary | ICD-10-CM | POA: Diagnosis not present

## 2018-09-17 DIAGNOSIS — E1165 Type 2 diabetes mellitus with hyperglycemia: Secondary | ICD-10-CM | POA: Diagnosis not present

## 2018-09-17 DIAGNOSIS — F4310 Post-traumatic stress disorder, unspecified: Secondary | ICD-10-CM | POA: Diagnosis not present

## 2018-09-17 DIAGNOSIS — I10 Essential (primary) hypertension: Secondary | ICD-10-CM | POA: Diagnosis not present

## 2018-09-18 DIAGNOSIS — N189 Chronic kidney disease, unspecified: Secondary | ICD-10-CM | POA: Diagnosis not present

## 2018-09-18 DIAGNOSIS — I10 Essential (primary) hypertension: Secondary | ICD-10-CM | POA: Diagnosis not present

## 2018-09-18 DIAGNOSIS — F4310 Post-traumatic stress disorder, unspecified: Secondary | ICD-10-CM | POA: Diagnosis not present

## 2018-09-18 DIAGNOSIS — M545 Low back pain: Secondary | ICD-10-CM | POA: Diagnosis not present

## 2018-09-18 DIAGNOSIS — E1165 Type 2 diabetes mellitus with hyperglycemia: Secondary | ICD-10-CM | POA: Diagnosis not present

## 2018-09-19 DIAGNOSIS — E1165 Type 2 diabetes mellitus with hyperglycemia: Secondary | ICD-10-CM | POA: Diagnosis not present

## 2018-09-19 DIAGNOSIS — I10 Essential (primary) hypertension: Secondary | ICD-10-CM | POA: Diagnosis not present

## 2018-09-19 DIAGNOSIS — N189 Chronic kidney disease, unspecified: Secondary | ICD-10-CM | POA: Diagnosis not present

## 2018-09-19 DIAGNOSIS — F4310 Post-traumatic stress disorder, unspecified: Secondary | ICD-10-CM | POA: Diagnosis not present

## 2018-09-19 DIAGNOSIS — M545 Low back pain: Secondary | ICD-10-CM | POA: Diagnosis not present

## 2018-09-20 DIAGNOSIS — I10 Essential (primary) hypertension: Secondary | ICD-10-CM | POA: Diagnosis not present

## 2018-09-20 DIAGNOSIS — N189 Chronic kidney disease, unspecified: Secondary | ICD-10-CM | POA: Diagnosis not present

## 2018-09-20 DIAGNOSIS — M545 Low back pain: Secondary | ICD-10-CM | POA: Diagnosis not present

## 2018-09-20 DIAGNOSIS — F4310 Post-traumatic stress disorder, unspecified: Secondary | ICD-10-CM | POA: Diagnosis not present

## 2018-09-20 DIAGNOSIS — E1165 Type 2 diabetes mellitus with hyperglycemia: Secondary | ICD-10-CM | POA: Diagnosis not present

## 2018-09-21 DIAGNOSIS — I10 Essential (primary) hypertension: Secondary | ICD-10-CM | POA: Diagnosis not present

## 2018-09-21 DIAGNOSIS — E1165 Type 2 diabetes mellitus with hyperglycemia: Secondary | ICD-10-CM | POA: Diagnosis not present

## 2018-09-21 DIAGNOSIS — M545 Low back pain: Secondary | ICD-10-CM | POA: Diagnosis not present

## 2018-09-21 DIAGNOSIS — F4310 Post-traumatic stress disorder, unspecified: Secondary | ICD-10-CM | POA: Diagnosis not present

## 2018-09-21 DIAGNOSIS — N189 Chronic kidney disease, unspecified: Secondary | ICD-10-CM | POA: Diagnosis not present

## 2018-09-22 DIAGNOSIS — I10 Essential (primary) hypertension: Secondary | ICD-10-CM | POA: Diagnosis not present

## 2018-09-22 DIAGNOSIS — N189 Chronic kidney disease, unspecified: Secondary | ICD-10-CM | POA: Diagnosis not present

## 2018-09-22 DIAGNOSIS — F4310 Post-traumatic stress disorder, unspecified: Secondary | ICD-10-CM | POA: Diagnosis not present

## 2018-09-22 DIAGNOSIS — M545 Low back pain: Secondary | ICD-10-CM | POA: Diagnosis not present

## 2018-09-22 DIAGNOSIS — E1165 Type 2 diabetes mellitus with hyperglycemia: Secondary | ICD-10-CM | POA: Diagnosis not present

## 2018-09-24 DIAGNOSIS — I10 Essential (primary) hypertension: Secondary | ICD-10-CM | POA: Diagnosis not present

## 2018-09-24 DIAGNOSIS — M545 Low back pain: Secondary | ICD-10-CM | POA: Diagnosis not present

## 2018-09-24 DIAGNOSIS — N189 Chronic kidney disease, unspecified: Secondary | ICD-10-CM | POA: Diagnosis not present

## 2018-09-24 DIAGNOSIS — E1165 Type 2 diabetes mellitus with hyperglycemia: Secondary | ICD-10-CM | POA: Diagnosis not present

## 2018-09-24 DIAGNOSIS — F4310 Post-traumatic stress disorder, unspecified: Secondary | ICD-10-CM | POA: Diagnosis not present

## 2018-09-25 DIAGNOSIS — M545 Low back pain: Secondary | ICD-10-CM | POA: Diagnosis not present

## 2018-09-25 DIAGNOSIS — E1165 Type 2 diabetes mellitus with hyperglycemia: Secondary | ICD-10-CM | POA: Diagnosis not present

## 2018-09-25 DIAGNOSIS — F4310 Post-traumatic stress disorder, unspecified: Secondary | ICD-10-CM | POA: Diagnosis not present

## 2018-09-25 DIAGNOSIS — N189 Chronic kidney disease, unspecified: Secondary | ICD-10-CM | POA: Diagnosis not present

## 2018-09-25 DIAGNOSIS — I10 Essential (primary) hypertension: Secondary | ICD-10-CM | POA: Diagnosis not present

## 2018-09-26 DIAGNOSIS — M545 Low back pain: Secondary | ICD-10-CM | POA: Diagnosis not present

## 2018-09-26 DIAGNOSIS — I10 Essential (primary) hypertension: Secondary | ICD-10-CM | POA: Diagnosis not present

## 2018-09-26 DIAGNOSIS — F4310 Post-traumatic stress disorder, unspecified: Secondary | ICD-10-CM | POA: Diagnosis not present

## 2018-09-26 DIAGNOSIS — E1165 Type 2 diabetes mellitus with hyperglycemia: Secondary | ICD-10-CM | POA: Diagnosis not present

## 2018-09-26 DIAGNOSIS — N189 Chronic kidney disease, unspecified: Secondary | ICD-10-CM | POA: Diagnosis not present

## 2018-09-27 DIAGNOSIS — I10 Essential (primary) hypertension: Secondary | ICD-10-CM | POA: Diagnosis not present

## 2018-09-27 DIAGNOSIS — F4310 Post-traumatic stress disorder, unspecified: Secondary | ICD-10-CM | POA: Diagnosis not present

## 2018-09-27 DIAGNOSIS — N189 Chronic kidney disease, unspecified: Secondary | ICD-10-CM | POA: Diagnosis not present

## 2018-09-27 DIAGNOSIS — M545 Low back pain: Secondary | ICD-10-CM | POA: Diagnosis not present

## 2018-09-27 DIAGNOSIS — E1165 Type 2 diabetes mellitus with hyperglycemia: Secondary | ICD-10-CM | POA: Diagnosis not present

## 2018-09-28 DIAGNOSIS — N189 Chronic kidney disease, unspecified: Secondary | ICD-10-CM | POA: Diagnosis not present

## 2018-09-28 DIAGNOSIS — F4310 Post-traumatic stress disorder, unspecified: Secondary | ICD-10-CM | POA: Diagnosis not present

## 2018-09-28 DIAGNOSIS — I10 Essential (primary) hypertension: Secondary | ICD-10-CM | POA: Diagnosis not present

## 2018-09-28 DIAGNOSIS — E1165 Type 2 diabetes mellitus with hyperglycemia: Secondary | ICD-10-CM | POA: Diagnosis not present

## 2018-09-28 DIAGNOSIS — M545 Low back pain: Secondary | ICD-10-CM | POA: Diagnosis not present

## 2018-09-29 DIAGNOSIS — I10 Essential (primary) hypertension: Secondary | ICD-10-CM | POA: Diagnosis not present

## 2018-09-29 DIAGNOSIS — F4310 Post-traumatic stress disorder, unspecified: Secondary | ICD-10-CM | POA: Diagnosis not present

## 2018-09-29 DIAGNOSIS — N189 Chronic kidney disease, unspecified: Secondary | ICD-10-CM | POA: Diagnosis not present

## 2018-09-29 DIAGNOSIS — M545 Low back pain: Secondary | ICD-10-CM | POA: Diagnosis not present

## 2018-09-29 DIAGNOSIS — E1165 Type 2 diabetes mellitus with hyperglycemia: Secondary | ICD-10-CM | POA: Diagnosis not present

## 2018-10-05 DIAGNOSIS — M79604 Pain in right leg: Secondary | ICD-10-CM | POA: Diagnosis not present

## 2018-10-05 DIAGNOSIS — M545 Low back pain: Secondary | ICD-10-CM | POA: Diagnosis not present

## 2018-10-05 DIAGNOSIS — G894 Chronic pain syndrome: Secondary | ICD-10-CM | POA: Diagnosis not present

## 2018-10-05 DIAGNOSIS — M79605 Pain in left leg: Secondary | ICD-10-CM | POA: Diagnosis not present

## 2018-10-08 DIAGNOSIS — N189 Chronic kidney disease, unspecified: Secondary | ICD-10-CM | POA: Diagnosis not present

## 2018-10-08 DIAGNOSIS — E1165 Type 2 diabetes mellitus with hyperglycemia: Secondary | ICD-10-CM | POA: Diagnosis not present

## 2018-10-08 DIAGNOSIS — F4310 Post-traumatic stress disorder, unspecified: Secondary | ICD-10-CM | POA: Diagnosis not present

## 2018-10-08 DIAGNOSIS — M545 Low back pain: Secondary | ICD-10-CM | POA: Diagnosis not present

## 2018-10-08 DIAGNOSIS — I10 Essential (primary) hypertension: Secondary | ICD-10-CM | POA: Diagnosis not present

## 2018-10-09 DIAGNOSIS — M545 Low back pain: Secondary | ICD-10-CM | POA: Diagnosis not present

## 2018-10-09 DIAGNOSIS — E1165 Type 2 diabetes mellitus with hyperglycemia: Secondary | ICD-10-CM | POA: Diagnosis not present

## 2018-10-09 DIAGNOSIS — N189 Chronic kidney disease, unspecified: Secondary | ICD-10-CM | POA: Diagnosis not present

## 2018-10-09 DIAGNOSIS — I10 Essential (primary) hypertension: Secondary | ICD-10-CM | POA: Diagnosis not present

## 2018-10-09 DIAGNOSIS — F4310 Post-traumatic stress disorder, unspecified: Secondary | ICD-10-CM | POA: Diagnosis not present

## 2018-10-10 DIAGNOSIS — E119 Type 2 diabetes mellitus without complications: Secondary | ICD-10-CM | POA: Diagnosis not present

## 2018-10-10 DIAGNOSIS — I120 Hypertensive chronic kidney disease with stage 5 chronic kidney disease or end stage renal disease: Secondary | ICD-10-CM | POA: Diagnosis not present

## 2018-10-10 DIAGNOSIS — F4310 Post-traumatic stress disorder, unspecified: Secondary | ICD-10-CM | POA: Diagnosis not present

## 2018-10-10 DIAGNOSIS — N189 Chronic kidney disease, unspecified: Secondary | ICD-10-CM | POA: Diagnosis not present

## 2018-10-10 DIAGNOSIS — D631 Anemia in chronic kidney disease: Secondary | ICD-10-CM | POA: Diagnosis not present

## 2018-10-10 DIAGNOSIS — E785 Hyperlipidemia, unspecified: Secondary | ICD-10-CM | POA: Diagnosis not present

## 2018-10-10 DIAGNOSIS — E1122 Type 2 diabetes mellitus with diabetic chronic kidney disease: Secondary | ICD-10-CM | POA: Diagnosis not present

## 2018-10-10 DIAGNOSIS — E1165 Type 2 diabetes mellitus with hyperglycemia: Secondary | ICD-10-CM | POA: Diagnosis not present

## 2018-10-10 DIAGNOSIS — I10 Essential (primary) hypertension: Secondary | ICD-10-CM | POA: Diagnosis not present

## 2018-10-10 DIAGNOSIS — Z7984 Long term (current) use of oral hypoglycemic drugs: Secondary | ICD-10-CM | POA: Diagnosis not present

## 2018-10-10 DIAGNOSIS — N185 Chronic kidney disease, stage 5: Secondary | ICD-10-CM | POA: Diagnosis not present

## 2018-10-10 DIAGNOSIS — I6522 Occlusion and stenosis of left carotid artery: Secondary | ICD-10-CM | POA: Diagnosis not present

## 2018-10-10 DIAGNOSIS — M545 Low back pain: Secondary | ICD-10-CM | POA: Diagnosis not present

## 2018-10-10 DIAGNOSIS — I509 Heart failure, unspecified: Secondary | ICD-10-CM | POA: Diagnosis not present

## 2018-10-10 DIAGNOSIS — G629 Polyneuropathy, unspecified: Secondary | ICD-10-CM | POA: Diagnosis not present

## 2018-10-11 DIAGNOSIS — I10 Essential (primary) hypertension: Secondary | ICD-10-CM | POA: Diagnosis not present

## 2018-10-11 DIAGNOSIS — N189 Chronic kidney disease, unspecified: Secondary | ICD-10-CM | POA: Diagnosis not present

## 2018-10-11 DIAGNOSIS — M545 Low back pain: Secondary | ICD-10-CM | POA: Diagnosis not present

## 2018-10-11 DIAGNOSIS — E1165 Type 2 diabetes mellitus with hyperglycemia: Secondary | ICD-10-CM | POA: Diagnosis not present

## 2018-10-11 DIAGNOSIS — F4310 Post-traumatic stress disorder, unspecified: Secondary | ICD-10-CM | POA: Diagnosis not present

## 2018-10-12 DIAGNOSIS — N189 Chronic kidney disease, unspecified: Secondary | ICD-10-CM | POA: Diagnosis not present

## 2018-10-12 DIAGNOSIS — F4310 Post-traumatic stress disorder, unspecified: Secondary | ICD-10-CM | POA: Diagnosis not present

## 2018-10-12 DIAGNOSIS — M545 Low back pain: Secondary | ICD-10-CM | POA: Diagnosis not present

## 2018-10-12 DIAGNOSIS — E1165 Type 2 diabetes mellitus with hyperglycemia: Secondary | ICD-10-CM | POA: Diagnosis not present

## 2018-10-12 DIAGNOSIS — I10 Essential (primary) hypertension: Secondary | ICD-10-CM | POA: Diagnosis not present

## 2018-10-13 DIAGNOSIS — M545 Low back pain: Secondary | ICD-10-CM | POA: Diagnosis not present

## 2018-10-13 DIAGNOSIS — F4310 Post-traumatic stress disorder, unspecified: Secondary | ICD-10-CM | POA: Diagnosis not present

## 2018-10-13 DIAGNOSIS — E1165 Type 2 diabetes mellitus with hyperglycemia: Secondary | ICD-10-CM | POA: Diagnosis not present

## 2018-10-13 DIAGNOSIS — I10 Essential (primary) hypertension: Secondary | ICD-10-CM | POA: Diagnosis not present

## 2018-10-13 DIAGNOSIS — N189 Chronic kidney disease, unspecified: Secondary | ICD-10-CM | POA: Diagnosis not present

## 2018-10-15 DIAGNOSIS — I10 Essential (primary) hypertension: Secondary | ICD-10-CM | POA: Diagnosis not present

## 2018-10-15 DIAGNOSIS — E1165 Type 2 diabetes mellitus with hyperglycemia: Secondary | ICD-10-CM | POA: Diagnosis not present

## 2018-10-15 DIAGNOSIS — M545 Low back pain: Secondary | ICD-10-CM | POA: Diagnosis not present

## 2018-10-15 DIAGNOSIS — F4310 Post-traumatic stress disorder, unspecified: Secondary | ICD-10-CM | POA: Diagnosis not present

## 2018-10-15 DIAGNOSIS — N189 Chronic kidney disease, unspecified: Secondary | ICD-10-CM | POA: Diagnosis not present

## 2018-10-16 DIAGNOSIS — M545 Low back pain: Secondary | ICD-10-CM | POA: Diagnosis not present

## 2018-10-16 DIAGNOSIS — F4310 Post-traumatic stress disorder, unspecified: Secondary | ICD-10-CM | POA: Diagnosis not present

## 2018-10-16 DIAGNOSIS — N189 Chronic kidney disease, unspecified: Secondary | ICD-10-CM | POA: Diagnosis not present

## 2018-10-16 DIAGNOSIS — E1165 Type 2 diabetes mellitus with hyperglycemia: Secondary | ICD-10-CM | POA: Diagnosis not present

## 2018-10-16 DIAGNOSIS — I10 Essential (primary) hypertension: Secondary | ICD-10-CM | POA: Diagnosis not present

## 2018-10-17 DIAGNOSIS — N189 Chronic kidney disease, unspecified: Secondary | ICD-10-CM | POA: Diagnosis not present

## 2018-10-17 DIAGNOSIS — I10 Essential (primary) hypertension: Secondary | ICD-10-CM | POA: Diagnosis not present

## 2018-10-17 DIAGNOSIS — M545 Low back pain: Secondary | ICD-10-CM | POA: Diagnosis not present

## 2018-10-17 DIAGNOSIS — E1165 Type 2 diabetes mellitus with hyperglycemia: Secondary | ICD-10-CM | POA: Diagnosis not present

## 2018-10-17 DIAGNOSIS — F4310 Post-traumatic stress disorder, unspecified: Secondary | ICD-10-CM | POA: Diagnosis not present

## 2018-10-18 DIAGNOSIS — E1165 Type 2 diabetes mellitus with hyperglycemia: Secondary | ICD-10-CM | POA: Diagnosis not present

## 2018-10-18 DIAGNOSIS — N189 Chronic kidney disease, unspecified: Secondary | ICD-10-CM | POA: Diagnosis not present

## 2018-10-18 DIAGNOSIS — I10 Essential (primary) hypertension: Secondary | ICD-10-CM | POA: Diagnosis not present

## 2018-10-18 DIAGNOSIS — F4310 Post-traumatic stress disorder, unspecified: Secondary | ICD-10-CM | POA: Diagnosis not present

## 2018-10-18 DIAGNOSIS — M545 Low back pain: Secondary | ICD-10-CM | POA: Diagnosis not present

## 2018-10-19 DIAGNOSIS — N189 Chronic kidney disease, unspecified: Secondary | ICD-10-CM | POA: Diagnosis not present

## 2018-10-19 DIAGNOSIS — I10 Essential (primary) hypertension: Secondary | ICD-10-CM | POA: Diagnosis not present

## 2018-10-19 DIAGNOSIS — M545 Low back pain: Secondary | ICD-10-CM | POA: Diagnosis not present

## 2018-10-19 DIAGNOSIS — F4310 Post-traumatic stress disorder, unspecified: Secondary | ICD-10-CM | POA: Diagnosis not present

## 2018-10-19 DIAGNOSIS — E1165 Type 2 diabetes mellitus with hyperglycemia: Secondary | ICD-10-CM | POA: Diagnosis not present

## 2018-10-20 DIAGNOSIS — I10 Essential (primary) hypertension: Secondary | ICD-10-CM | POA: Diagnosis not present

## 2018-10-20 DIAGNOSIS — M545 Low back pain: Secondary | ICD-10-CM | POA: Diagnosis not present

## 2018-10-20 DIAGNOSIS — F4310 Post-traumatic stress disorder, unspecified: Secondary | ICD-10-CM | POA: Diagnosis not present

## 2018-10-20 DIAGNOSIS — E1165 Type 2 diabetes mellitus with hyperglycemia: Secondary | ICD-10-CM | POA: Diagnosis not present

## 2018-10-20 DIAGNOSIS — N189 Chronic kidney disease, unspecified: Secondary | ICD-10-CM | POA: Diagnosis not present

## 2018-10-22 DIAGNOSIS — F4310 Post-traumatic stress disorder, unspecified: Secondary | ICD-10-CM | POA: Diagnosis not present

## 2018-10-22 DIAGNOSIS — N189 Chronic kidney disease, unspecified: Secondary | ICD-10-CM | POA: Diagnosis not present

## 2018-10-22 DIAGNOSIS — E1165 Type 2 diabetes mellitus with hyperglycemia: Secondary | ICD-10-CM | POA: Diagnosis not present

## 2018-10-22 DIAGNOSIS — M545 Low back pain: Secondary | ICD-10-CM | POA: Diagnosis not present

## 2018-10-22 DIAGNOSIS — I10 Essential (primary) hypertension: Secondary | ICD-10-CM | POA: Diagnosis not present

## 2018-10-22 DIAGNOSIS — N185 Chronic kidney disease, stage 5: Secondary | ICD-10-CM | POA: Diagnosis not present

## 2018-10-22 DIAGNOSIS — E119 Type 2 diabetes mellitus without complications: Secondary | ICD-10-CM | POA: Diagnosis not present

## 2018-10-22 DIAGNOSIS — E785 Hyperlipidemia, unspecified: Secondary | ICD-10-CM | POA: Diagnosis not present

## 2018-10-23 DIAGNOSIS — M79674 Pain in right toe(s): Secondary | ICD-10-CM | POA: Diagnosis not present

## 2018-10-23 DIAGNOSIS — N189 Chronic kidney disease, unspecified: Secondary | ICD-10-CM | POA: Diagnosis not present

## 2018-10-23 DIAGNOSIS — B351 Tinea unguium: Secondary | ICD-10-CM | POA: Diagnosis not present

## 2018-10-23 DIAGNOSIS — D492 Neoplasm of unspecified behavior of bone, soft tissue, and skin: Secondary | ICD-10-CM | POA: Diagnosis not present

## 2018-10-23 DIAGNOSIS — L97521 Non-pressure chronic ulcer of other part of left foot limited to breakdown of skin: Secondary | ICD-10-CM | POA: Diagnosis not present

## 2018-10-23 DIAGNOSIS — E1165 Type 2 diabetes mellitus with hyperglycemia: Secondary | ICD-10-CM | POA: Diagnosis not present

## 2018-10-23 DIAGNOSIS — N185 Chronic kidney disease, stage 5: Secondary | ICD-10-CM | POA: Diagnosis not present

## 2018-10-23 DIAGNOSIS — I10 Essential (primary) hypertension: Secondary | ICD-10-CM | POA: Diagnosis not present

## 2018-10-23 DIAGNOSIS — F4310 Post-traumatic stress disorder, unspecified: Secondary | ICD-10-CM | POA: Diagnosis not present

## 2018-10-23 DIAGNOSIS — E785 Hyperlipidemia, unspecified: Secondary | ICD-10-CM | POA: Diagnosis not present

## 2018-10-23 DIAGNOSIS — E109 Type 1 diabetes mellitus without complications: Secondary | ICD-10-CM | POA: Diagnosis not present

## 2018-10-23 DIAGNOSIS — E119 Type 2 diabetes mellitus without complications: Secondary | ICD-10-CM | POA: Diagnosis not present

## 2018-10-23 DIAGNOSIS — M79675 Pain in left toe(s): Secondary | ICD-10-CM | POA: Diagnosis not present

## 2018-10-23 DIAGNOSIS — M545 Low back pain: Secondary | ICD-10-CM | POA: Diagnosis not present

## 2018-10-24 DIAGNOSIS — N189 Chronic kidney disease, unspecified: Secondary | ICD-10-CM | POA: Diagnosis not present

## 2018-10-24 DIAGNOSIS — F4310 Post-traumatic stress disorder, unspecified: Secondary | ICD-10-CM | POA: Diagnosis not present

## 2018-10-24 DIAGNOSIS — E1165 Type 2 diabetes mellitus with hyperglycemia: Secondary | ICD-10-CM | POA: Diagnosis not present

## 2018-10-24 DIAGNOSIS — M545 Low back pain: Secondary | ICD-10-CM | POA: Diagnosis not present

## 2018-10-24 DIAGNOSIS — I10 Essential (primary) hypertension: Secondary | ICD-10-CM | POA: Diagnosis not present

## 2018-10-25 DIAGNOSIS — F4310 Post-traumatic stress disorder, unspecified: Secondary | ICD-10-CM | POA: Diagnosis not present

## 2018-10-25 DIAGNOSIS — I10 Essential (primary) hypertension: Secondary | ICD-10-CM | POA: Diagnosis not present

## 2018-10-25 DIAGNOSIS — E1165 Type 2 diabetes mellitus with hyperglycemia: Secondary | ICD-10-CM | POA: Diagnosis not present

## 2018-10-25 DIAGNOSIS — M545 Low back pain: Secondary | ICD-10-CM | POA: Diagnosis not present

## 2018-10-25 DIAGNOSIS — N189 Chronic kidney disease, unspecified: Secondary | ICD-10-CM | POA: Diagnosis not present

## 2018-10-26 DIAGNOSIS — E1165 Type 2 diabetes mellitus with hyperglycemia: Secondary | ICD-10-CM | POA: Diagnosis not present

## 2018-10-26 DIAGNOSIS — M545 Low back pain: Secondary | ICD-10-CM | POA: Diagnosis not present

## 2018-10-26 DIAGNOSIS — F4310 Post-traumatic stress disorder, unspecified: Secondary | ICD-10-CM | POA: Diagnosis not present

## 2018-10-26 DIAGNOSIS — N189 Chronic kidney disease, unspecified: Secondary | ICD-10-CM | POA: Diagnosis not present

## 2018-10-26 DIAGNOSIS — I10 Essential (primary) hypertension: Secondary | ICD-10-CM | POA: Diagnosis not present

## 2018-10-27 DIAGNOSIS — E1165 Type 2 diabetes mellitus with hyperglycemia: Secondary | ICD-10-CM | POA: Diagnosis not present

## 2018-10-27 DIAGNOSIS — M545 Low back pain: Secondary | ICD-10-CM | POA: Diagnosis not present

## 2018-10-27 DIAGNOSIS — F4310 Post-traumatic stress disorder, unspecified: Secondary | ICD-10-CM | POA: Diagnosis not present

## 2018-10-27 DIAGNOSIS — N189 Chronic kidney disease, unspecified: Secondary | ICD-10-CM | POA: Diagnosis not present

## 2018-10-27 DIAGNOSIS — I10 Essential (primary) hypertension: Secondary | ICD-10-CM | POA: Diagnosis not present

## 2018-10-29 DIAGNOSIS — E1165 Type 2 diabetes mellitus with hyperglycemia: Secondary | ICD-10-CM | POA: Diagnosis not present

## 2018-10-29 DIAGNOSIS — I10 Essential (primary) hypertension: Secondary | ICD-10-CM | POA: Diagnosis not present

## 2018-10-29 DIAGNOSIS — M545 Low back pain: Secondary | ICD-10-CM | POA: Diagnosis not present

## 2018-10-29 DIAGNOSIS — N189 Chronic kidney disease, unspecified: Secondary | ICD-10-CM | POA: Diagnosis not present

## 2018-10-29 DIAGNOSIS — F4310 Post-traumatic stress disorder, unspecified: Secondary | ICD-10-CM | POA: Diagnosis not present

## 2018-10-30 DIAGNOSIS — M545 Low back pain: Secondary | ICD-10-CM | POA: Diagnosis not present

## 2018-10-30 DIAGNOSIS — I10 Essential (primary) hypertension: Secondary | ICD-10-CM | POA: Diagnosis not present

## 2018-10-30 DIAGNOSIS — E1165 Type 2 diabetes mellitus with hyperglycemia: Secondary | ICD-10-CM | POA: Diagnosis not present

## 2018-10-30 DIAGNOSIS — N189 Chronic kidney disease, unspecified: Secondary | ICD-10-CM | POA: Diagnosis not present

## 2018-10-30 DIAGNOSIS — F4310 Post-traumatic stress disorder, unspecified: Secondary | ICD-10-CM | POA: Diagnosis not present

## 2018-10-31 DIAGNOSIS — N189 Chronic kidney disease, unspecified: Secondary | ICD-10-CM | POA: Diagnosis not present

## 2018-10-31 DIAGNOSIS — D631 Anemia in chronic kidney disease: Secondary | ICD-10-CM | POA: Diagnosis not present

## 2018-10-31 DIAGNOSIS — N184 Chronic kidney disease, stage 4 (severe): Secondary | ICD-10-CM | POA: Diagnosis not present

## 2018-10-31 DIAGNOSIS — E1165 Type 2 diabetes mellitus with hyperglycemia: Secondary | ICD-10-CM | POA: Diagnosis not present

## 2018-10-31 DIAGNOSIS — F4310 Post-traumatic stress disorder, unspecified: Secondary | ICD-10-CM | POA: Diagnosis not present

## 2018-10-31 DIAGNOSIS — M545 Low back pain: Secondary | ICD-10-CM | POA: Diagnosis not present

## 2018-10-31 DIAGNOSIS — I10 Essential (primary) hypertension: Secondary | ICD-10-CM | POA: Diagnosis not present

## 2018-11-01 DIAGNOSIS — N189 Chronic kidney disease, unspecified: Secondary | ICD-10-CM | POA: Diagnosis not present

## 2018-11-01 DIAGNOSIS — E1165 Type 2 diabetes mellitus with hyperglycemia: Secondary | ICD-10-CM | POA: Diagnosis not present

## 2018-11-01 DIAGNOSIS — M545 Low back pain: Secondary | ICD-10-CM | POA: Diagnosis not present

## 2018-11-01 DIAGNOSIS — F4310 Post-traumatic stress disorder, unspecified: Secondary | ICD-10-CM | POA: Diagnosis not present

## 2018-11-01 DIAGNOSIS — I10 Essential (primary) hypertension: Secondary | ICD-10-CM | POA: Diagnosis not present

## 2018-11-02 DIAGNOSIS — M545 Low back pain: Secondary | ICD-10-CM | POA: Diagnosis not present

## 2018-11-02 DIAGNOSIS — M79604 Pain in right leg: Secondary | ICD-10-CM | POA: Diagnosis not present

## 2018-11-02 DIAGNOSIS — M25571 Pain in right ankle and joints of right foot: Secondary | ICD-10-CM | POA: Diagnosis not present

## 2018-11-02 DIAGNOSIS — N189 Chronic kidney disease, unspecified: Secondary | ICD-10-CM | POA: Diagnosis not present

## 2018-11-02 DIAGNOSIS — I10 Essential (primary) hypertension: Secondary | ICD-10-CM | POA: Diagnosis not present

## 2018-11-02 DIAGNOSIS — E1165 Type 2 diabetes mellitus with hyperglycemia: Secondary | ICD-10-CM | POA: Diagnosis not present

## 2018-11-02 DIAGNOSIS — M79605 Pain in left leg: Secondary | ICD-10-CM | POA: Diagnosis not present

## 2018-11-02 DIAGNOSIS — F4310 Post-traumatic stress disorder, unspecified: Secondary | ICD-10-CM | POA: Diagnosis not present

## 2018-11-03 DIAGNOSIS — N189 Chronic kidney disease, unspecified: Secondary | ICD-10-CM | POA: Diagnosis not present

## 2018-11-03 DIAGNOSIS — F4310 Post-traumatic stress disorder, unspecified: Secondary | ICD-10-CM | POA: Diagnosis not present

## 2018-11-03 DIAGNOSIS — I10 Essential (primary) hypertension: Secondary | ICD-10-CM | POA: Diagnosis not present

## 2018-11-03 DIAGNOSIS — M545 Low back pain: Secondary | ICD-10-CM | POA: Diagnosis not present

## 2018-11-03 DIAGNOSIS — E1165 Type 2 diabetes mellitus with hyperglycemia: Secondary | ICD-10-CM | POA: Diagnosis not present

## 2018-11-05 DIAGNOSIS — E1165 Type 2 diabetes mellitus with hyperglycemia: Secondary | ICD-10-CM | POA: Diagnosis not present

## 2018-11-05 DIAGNOSIS — N189 Chronic kidney disease, unspecified: Secondary | ICD-10-CM | POA: Diagnosis not present

## 2018-11-05 DIAGNOSIS — M545 Low back pain: Secondary | ICD-10-CM | POA: Diagnosis not present

## 2018-11-05 DIAGNOSIS — I10 Essential (primary) hypertension: Secondary | ICD-10-CM | POA: Diagnosis not present

## 2018-11-05 DIAGNOSIS — F4310 Post-traumatic stress disorder, unspecified: Secondary | ICD-10-CM | POA: Diagnosis not present

## 2018-11-06 DIAGNOSIS — F4310 Post-traumatic stress disorder, unspecified: Secondary | ICD-10-CM | POA: Diagnosis not present

## 2018-11-06 DIAGNOSIS — N189 Chronic kidney disease, unspecified: Secondary | ICD-10-CM | POA: Diagnosis not present

## 2018-11-06 DIAGNOSIS — M545 Low back pain: Secondary | ICD-10-CM | POA: Diagnosis not present

## 2018-11-06 DIAGNOSIS — E1165 Type 2 diabetes mellitus with hyperglycemia: Secondary | ICD-10-CM | POA: Diagnosis not present

## 2018-11-06 DIAGNOSIS — I10 Essential (primary) hypertension: Secondary | ICD-10-CM | POA: Diagnosis not present

## 2018-11-07 DIAGNOSIS — N189 Chronic kidney disease, unspecified: Secondary | ICD-10-CM | POA: Diagnosis not present

## 2018-11-07 DIAGNOSIS — I10 Essential (primary) hypertension: Secondary | ICD-10-CM | POA: Diagnosis not present

## 2018-11-07 DIAGNOSIS — M545 Low back pain: Secondary | ICD-10-CM | POA: Diagnosis not present

## 2018-11-07 DIAGNOSIS — E1165 Type 2 diabetes mellitus with hyperglycemia: Secondary | ICD-10-CM | POA: Diagnosis not present

## 2018-11-07 DIAGNOSIS — F4310 Post-traumatic stress disorder, unspecified: Secondary | ICD-10-CM | POA: Diagnosis not present

## 2018-11-08 DIAGNOSIS — M545 Low back pain: Secondary | ICD-10-CM | POA: Diagnosis not present

## 2018-11-08 DIAGNOSIS — N189 Chronic kidney disease, unspecified: Secondary | ICD-10-CM | POA: Diagnosis not present

## 2018-11-08 DIAGNOSIS — I10 Essential (primary) hypertension: Secondary | ICD-10-CM | POA: Diagnosis not present

## 2018-11-08 DIAGNOSIS — F4310 Post-traumatic stress disorder, unspecified: Secondary | ICD-10-CM | POA: Diagnosis not present

## 2018-11-08 DIAGNOSIS — E1165 Type 2 diabetes mellitus with hyperglycemia: Secondary | ICD-10-CM | POA: Diagnosis not present

## 2018-11-09 DIAGNOSIS — N189 Chronic kidney disease, unspecified: Secondary | ICD-10-CM | POA: Diagnosis not present

## 2018-11-09 DIAGNOSIS — I10 Essential (primary) hypertension: Secondary | ICD-10-CM | POA: Diagnosis not present

## 2018-11-09 DIAGNOSIS — F4310 Post-traumatic stress disorder, unspecified: Secondary | ICD-10-CM | POA: Diagnosis not present

## 2018-11-09 DIAGNOSIS — E1165 Type 2 diabetes mellitus with hyperglycemia: Secondary | ICD-10-CM | POA: Diagnosis not present

## 2018-11-09 DIAGNOSIS — M545 Low back pain: Secondary | ICD-10-CM | POA: Diagnosis not present

## 2018-11-12 DIAGNOSIS — M545 Low back pain: Secondary | ICD-10-CM | POA: Diagnosis not present

## 2018-11-12 DIAGNOSIS — N189 Chronic kidney disease, unspecified: Secondary | ICD-10-CM | POA: Diagnosis not present

## 2018-11-12 DIAGNOSIS — I10 Essential (primary) hypertension: Secondary | ICD-10-CM | POA: Diagnosis not present

## 2018-11-12 DIAGNOSIS — F4310 Post-traumatic stress disorder, unspecified: Secondary | ICD-10-CM | POA: Diagnosis not present

## 2018-11-12 DIAGNOSIS — E1165 Type 2 diabetes mellitus with hyperglycemia: Secondary | ICD-10-CM | POA: Diagnosis not present

## 2018-11-13 DIAGNOSIS — M545 Low back pain: Secondary | ICD-10-CM | POA: Diagnosis not present

## 2018-11-13 DIAGNOSIS — I10 Essential (primary) hypertension: Secondary | ICD-10-CM | POA: Diagnosis not present

## 2018-11-13 DIAGNOSIS — E1165 Type 2 diabetes mellitus with hyperglycemia: Secondary | ICD-10-CM | POA: Diagnosis not present

## 2018-11-13 DIAGNOSIS — E119 Type 2 diabetes mellitus without complications: Secondary | ICD-10-CM | POA: Diagnosis not present

## 2018-11-13 DIAGNOSIS — G629 Polyneuropathy, unspecified: Secondary | ICD-10-CM | POA: Diagnosis not present

## 2018-11-13 DIAGNOSIS — F4310 Post-traumatic stress disorder, unspecified: Secondary | ICD-10-CM | POA: Diagnosis not present

## 2018-11-13 DIAGNOSIS — G4700 Insomnia, unspecified: Secondary | ICD-10-CM | POA: Diagnosis not present

## 2018-11-13 DIAGNOSIS — N189 Chronic kidney disease, unspecified: Secondary | ICD-10-CM | POA: Diagnosis not present

## 2018-11-14 DIAGNOSIS — I10 Essential (primary) hypertension: Secondary | ICD-10-CM | POA: Diagnosis not present

## 2018-11-14 DIAGNOSIS — N189 Chronic kidney disease, unspecified: Secondary | ICD-10-CM | POA: Diagnosis not present

## 2018-11-14 DIAGNOSIS — E1165 Type 2 diabetes mellitus with hyperglycemia: Secondary | ICD-10-CM | POA: Diagnosis not present

## 2018-11-14 DIAGNOSIS — M545 Low back pain: Secondary | ICD-10-CM | POA: Diagnosis not present

## 2018-11-14 DIAGNOSIS — F4310 Post-traumatic stress disorder, unspecified: Secondary | ICD-10-CM | POA: Diagnosis not present

## 2018-11-15 DIAGNOSIS — N189 Chronic kidney disease, unspecified: Secondary | ICD-10-CM | POA: Diagnosis not present

## 2018-11-15 DIAGNOSIS — M545 Low back pain: Secondary | ICD-10-CM | POA: Diagnosis not present

## 2018-11-15 DIAGNOSIS — F4310 Post-traumatic stress disorder, unspecified: Secondary | ICD-10-CM | POA: Diagnosis not present

## 2018-11-15 DIAGNOSIS — I10 Essential (primary) hypertension: Secondary | ICD-10-CM | POA: Diagnosis not present

## 2018-11-15 DIAGNOSIS — E1165 Type 2 diabetes mellitus with hyperglycemia: Secondary | ICD-10-CM | POA: Diagnosis not present

## 2018-11-16 DIAGNOSIS — Z20828 Contact with and (suspected) exposure to other viral communicable diseases: Secondary | ICD-10-CM | POA: Diagnosis not present

## 2018-11-21 DIAGNOSIS — F4310 Post-traumatic stress disorder, unspecified: Secondary | ICD-10-CM | POA: Diagnosis not present

## 2018-11-21 DIAGNOSIS — N189 Chronic kidney disease, unspecified: Secondary | ICD-10-CM | POA: Diagnosis not present

## 2018-11-21 DIAGNOSIS — M545 Low back pain: Secondary | ICD-10-CM | POA: Diagnosis not present

## 2018-11-21 DIAGNOSIS — I10 Essential (primary) hypertension: Secondary | ICD-10-CM | POA: Diagnosis not present

## 2018-11-21 DIAGNOSIS — E1165 Type 2 diabetes mellitus with hyperglycemia: Secondary | ICD-10-CM | POA: Diagnosis not present

## 2018-11-22 DIAGNOSIS — N189 Chronic kidney disease, unspecified: Secondary | ICD-10-CM | POA: Diagnosis not present

## 2018-11-22 DIAGNOSIS — I10 Essential (primary) hypertension: Secondary | ICD-10-CM | POA: Diagnosis not present

## 2018-11-22 DIAGNOSIS — F4310 Post-traumatic stress disorder, unspecified: Secondary | ICD-10-CM | POA: Diagnosis not present

## 2018-11-22 DIAGNOSIS — M545 Low back pain: Secondary | ICD-10-CM | POA: Diagnosis not present

## 2018-11-22 DIAGNOSIS — E1165 Type 2 diabetes mellitus with hyperglycemia: Secondary | ICD-10-CM | POA: Diagnosis not present

## 2018-11-23 DIAGNOSIS — F4310 Post-traumatic stress disorder, unspecified: Secondary | ICD-10-CM | POA: Diagnosis not present

## 2018-11-23 DIAGNOSIS — E1165 Type 2 diabetes mellitus with hyperglycemia: Secondary | ICD-10-CM | POA: Diagnosis not present

## 2018-11-23 DIAGNOSIS — M545 Low back pain: Secondary | ICD-10-CM | POA: Diagnosis not present

## 2018-11-23 DIAGNOSIS — I10 Essential (primary) hypertension: Secondary | ICD-10-CM | POA: Diagnosis not present

## 2018-11-23 DIAGNOSIS — N189 Chronic kidney disease, unspecified: Secondary | ICD-10-CM | POA: Diagnosis not present

## 2018-11-24 DIAGNOSIS — I10 Essential (primary) hypertension: Secondary | ICD-10-CM | POA: Diagnosis not present

## 2018-11-24 DIAGNOSIS — N189 Chronic kidney disease, unspecified: Secondary | ICD-10-CM | POA: Diagnosis not present

## 2018-11-24 DIAGNOSIS — M545 Low back pain: Secondary | ICD-10-CM | POA: Diagnosis not present

## 2018-11-24 DIAGNOSIS — F4310 Post-traumatic stress disorder, unspecified: Secondary | ICD-10-CM | POA: Diagnosis not present

## 2018-11-24 DIAGNOSIS — E1165 Type 2 diabetes mellitus with hyperglycemia: Secondary | ICD-10-CM | POA: Diagnosis not present

## 2018-11-26 DIAGNOSIS — N189 Chronic kidney disease, unspecified: Secondary | ICD-10-CM | POA: Diagnosis not present

## 2018-11-26 DIAGNOSIS — E1165 Type 2 diabetes mellitus with hyperglycemia: Secondary | ICD-10-CM | POA: Diagnosis not present

## 2018-11-26 DIAGNOSIS — M545 Low back pain: Secondary | ICD-10-CM | POA: Diagnosis not present

## 2018-11-26 DIAGNOSIS — F4310 Post-traumatic stress disorder, unspecified: Secondary | ICD-10-CM | POA: Diagnosis not present

## 2018-11-26 DIAGNOSIS — I10 Essential (primary) hypertension: Secondary | ICD-10-CM | POA: Diagnosis not present

## 2018-11-27 DIAGNOSIS — N189 Chronic kidney disease, unspecified: Secondary | ICD-10-CM | POA: Diagnosis not present

## 2018-11-27 DIAGNOSIS — Z89421 Acquired absence of other right toe(s): Secondary | ICD-10-CM | POA: Diagnosis not present

## 2018-11-27 DIAGNOSIS — M779 Enthesopathy, unspecified: Secondary | ICD-10-CM | POA: Diagnosis not present

## 2018-11-27 DIAGNOSIS — D492 Neoplasm of unspecified behavior of bone, soft tissue, and skin: Secondary | ICD-10-CM | POA: Diagnosis not present

## 2018-11-27 DIAGNOSIS — I10 Essential (primary) hypertension: Secondary | ICD-10-CM | POA: Diagnosis not present

## 2018-11-27 DIAGNOSIS — E109 Type 1 diabetes mellitus without complications: Secondary | ICD-10-CM | POA: Diagnosis not present

## 2018-11-27 DIAGNOSIS — L97521 Non-pressure chronic ulcer of other part of left foot limited to breakdown of skin: Secondary | ICD-10-CM | POA: Diagnosis not present

## 2018-11-27 DIAGNOSIS — E1165 Type 2 diabetes mellitus with hyperglycemia: Secondary | ICD-10-CM | POA: Diagnosis not present

## 2018-11-27 DIAGNOSIS — F4310 Post-traumatic stress disorder, unspecified: Secondary | ICD-10-CM | POA: Diagnosis not present

## 2018-11-27 DIAGNOSIS — M24575 Contracture, left foot: Secondary | ICD-10-CM | POA: Diagnosis not present

## 2018-11-27 DIAGNOSIS — M545 Low back pain: Secondary | ICD-10-CM | POA: Diagnosis not present

## 2018-11-28 DIAGNOSIS — N189 Chronic kidney disease, unspecified: Secondary | ICD-10-CM | POA: Diagnosis not present

## 2018-11-28 DIAGNOSIS — F4310 Post-traumatic stress disorder, unspecified: Secondary | ICD-10-CM | POA: Diagnosis not present

## 2018-11-28 DIAGNOSIS — M545 Low back pain: Secondary | ICD-10-CM | POA: Diagnosis not present

## 2018-11-28 DIAGNOSIS — I10 Essential (primary) hypertension: Secondary | ICD-10-CM | POA: Diagnosis not present

## 2018-11-28 DIAGNOSIS — E1165 Type 2 diabetes mellitus with hyperglycemia: Secondary | ICD-10-CM | POA: Diagnosis not present

## 2018-11-29 DIAGNOSIS — E1165 Type 2 diabetes mellitus with hyperglycemia: Secondary | ICD-10-CM | POA: Diagnosis not present

## 2018-11-29 DIAGNOSIS — F4310 Post-traumatic stress disorder, unspecified: Secondary | ICD-10-CM | POA: Diagnosis not present

## 2018-11-29 DIAGNOSIS — I10 Essential (primary) hypertension: Secondary | ICD-10-CM | POA: Diagnosis not present

## 2018-11-29 DIAGNOSIS — M545 Low back pain: Secondary | ICD-10-CM | POA: Diagnosis not present

## 2018-11-29 DIAGNOSIS — N189 Chronic kidney disease, unspecified: Secondary | ICD-10-CM | POA: Diagnosis not present

## 2018-11-30 DIAGNOSIS — N189 Chronic kidney disease, unspecified: Secondary | ICD-10-CM | POA: Diagnosis not present

## 2018-11-30 DIAGNOSIS — F4310 Post-traumatic stress disorder, unspecified: Secondary | ICD-10-CM | POA: Diagnosis not present

## 2018-11-30 DIAGNOSIS — M545 Low back pain: Secondary | ICD-10-CM | POA: Diagnosis not present

## 2018-11-30 DIAGNOSIS — I10 Essential (primary) hypertension: Secondary | ICD-10-CM | POA: Diagnosis not present

## 2018-11-30 DIAGNOSIS — E1165 Type 2 diabetes mellitus with hyperglycemia: Secondary | ICD-10-CM | POA: Diagnosis not present

## 2018-12-01 DIAGNOSIS — F4310 Post-traumatic stress disorder, unspecified: Secondary | ICD-10-CM | POA: Diagnosis not present

## 2018-12-01 DIAGNOSIS — M545 Low back pain: Secondary | ICD-10-CM | POA: Diagnosis not present

## 2018-12-01 DIAGNOSIS — N189 Chronic kidney disease, unspecified: Secondary | ICD-10-CM | POA: Diagnosis not present

## 2018-12-01 DIAGNOSIS — I10 Essential (primary) hypertension: Secondary | ICD-10-CM | POA: Diagnosis not present

## 2018-12-01 DIAGNOSIS — E1165 Type 2 diabetes mellitus with hyperglycemia: Secondary | ICD-10-CM | POA: Diagnosis not present

## 2018-12-03 DIAGNOSIS — F4310 Post-traumatic stress disorder, unspecified: Secondary | ICD-10-CM | POA: Diagnosis not present

## 2018-12-03 DIAGNOSIS — E1165 Type 2 diabetes mellitus with hyperglycemia: Secondary | ICD-10-CM | POA: Diagnosis not present

## 2018-12-03 DIAGNOSIS — N189 Chronic kidney disease, unspecified: Secondary | ICD-10-CM | POA: Diagnosis not present

## 2018-12-03 DIAGNOSIS — I10 Essential (primary) hypertension: Secondary | ICD-10-CM | POA: Diagnosis not present

## 2018-12-03 DIAGNOSIS — M545 Low back pain: Secondary | ICD-10-CM | POA: Diagnosis not present

## 2018-12-04 DIAGNOSIS — E1165 Type 2 diabetes mellitus with hyperglycemia: Secondary | ICD-10-CM | POA: Diagnosis not present

## 2018-12-04 DIAGNOSIS — F4310 Post-traumatic stress disorder, unspecified: Secondary | ICD-10-CM | POA: Diagnosis not present

## 2018-12-04 DIAGNOSIS — I10 Essential (primary) hypertension: Secondary | ICD-10-CM | POA: Diagnosis not present

## 2018-12-04 DIAGNOSIS — M545 Low back pain: Secondary | ICD-10-CM | POA: Diagnosis not present

## 2018-12-04 DIAGNOSIS — N189 Chronic kidney disease, unspecified: Secondary | ICD-10-CM | POA: Diagnosis not present

## 2018-12-05 DIAGNOSIS — E1165 Type 2 diabetes mellitus with hyperglycemia: Secondary | ICD-10-CM | POA: Diagnosis not present

## 2018-12-05 DIAGNOSIS — M545 Low back pain: Secondary | ICD-10-CM | POA: Diagnosis not present

## 2018-12-05 DIAGNOSIS — I10 Essential (primary) hypertension: Secondary | ICD-10-CM | POA: Diagnosis not present

## 2018-12-05 DIAGNOSIS — N189 Chronic kidney disease, unspecified: Secondary | ICD-10-CM | POA: Diagnosis not present

## 2018-12-05 DIAGNOSIS — F4310 Post-traumatic stress disorder, unspecified: Secondary | ICD-10-CM | POA: Diagnosis not present

## 2018-12-06 DIAGNOSIS — I10 Essential (primary) hypertension: Secondary | ICD-10-CM | POA: Diagnosis not present

## 2018-12-06 DIAGNOSIS — E1165 Type 2 diabetes mellitus with hyperglycemia: Secondary | ICD-10-CM | POA: Diagnosis not present

## 2018-12-06 DIAGNOSIS — J3081 Allergic rhinitis due to animal (cat) (dog) hair and dander: Secondary | ICD-10-CM | POA: Diagnosis not present

## 2018-12-06 DIAGNOSIS — M79671 Pain in right foot: Secondary | ICD-10-CM | POA: Diagnosis not present

## 2018-12-06 DIAGNOSIS — J301 Allergic rhinitis due to pollen: Secondary | ICD-10-CM | POA: Diagnosis not present

## 2018-12-06 DIAGNOSIS — M79672 Pain in left foot: Secondary | ICD-10-CM | POA: Diagnosis not present

## 2018-12-06 DIAGNOSIS — F4310 Post-traumatic stress disorder, unspecified: Secondary | ICD-10-CM | POA: Diagnosis not present

## 2018-12-06 DIAGNOSIS — M545 Low back pain: Secondary | ICD-10-CM | POA: Diagnosis not present

## 2018-12-06 DIAGNOSIS — N189 Chronic kidney disease, unspecified: Secondary | ICD-10-CM | POA: Diagnosis not present

## 2018-12-07 DIAGNOSIS — I10 Essential (primary) hypertension: Secondary | ICD-10-CM | POA: Diagnosis not present

## 2018-12-07 DIAGNOSIS — E1165 Type 2 diabetes mellitus with hyperglycemia: Secondary | ICD-10-CM | POA: Diagnosis not present

## 2018-12-07 DIAGNOSIS — F4310 Post-traumatic stress disorder, unspecified: Secondary | ICD-10-CM | POA: Diagnosis not present

## 2018-12-07 DIAGNOSIS — N189 Chronic kidney disease, unspecified: Secondary | ICD-10-CM | POA: Diagnosis not present

## 2018-12-07 DIAGNOSIS — M545 Low back pain: Secondary | ICD-10-CM | POA: Diagnosis not present

## 2018-12-08 DIAGNOSIS — F4310 Post-traumatic stress disorder, unspecified: Secondary | ICD-10-CM | POA: Diagnosis not present

## 2018-12-08 DIAGNOSIS — M545 Low back pain: Secondary | ICD-10-CM | POA: Diagnosis not present

## 2018-12-08 DIAGNOSIS — I10 Essential (primary) hypertension: Secondary | ICD-10-CM | POA: Diagnosis not present

## 2018-12-08 DIAGNOSIS — E1165 Type 2 diabetes mellitus with hyperglycemia: Secondary | ICD-10-CM | POA: Diagnosis not present

## 2018-12-08 DIAGNOSIS — N189 Chronic kidney disease, unspecified: Secondary | ICD-10-CM | POA: Diagnosis not present

## 2018-12-10 DIAGNOSIS — N189 Chronic kidney disease, unspecified: Secondary | ICD-10-CM | POA: Diagnosis not present

## 2018-12-10 DIAGNOSIS — F4310 Post-traumatic stress disorder, unspecified: Secondary | ICD-10-CM | POA: Diagnosis not present

## 2018-12-10 DIAGNOSIS — I10 Essential (primary) hypertension: Secondary | ICD-10-CM | POA: Diagnosis not present

## 2018-12-10 DIAGNOSIS — M545 Low back pain: Secondary | ICD-10-CM | POA: Diagnosis not present

## 2018-12-10 DIAGNOSIS — E1165 Type 2 diabetes mellitus with hyperglycemia: Secondary | ICD-10-CM | POA: Diagnosis not present

## 2018-12-11 DIAGNOSIS — I10 Essential (primary) hypertension: Secondary | ICD-10-CM | POA: Diagnosis not present

## 2018-12-11 DIAGNOSIS — M545 Low back pain: Secondary | ICD-10-CM | POA: Diagnosis not present

## 2018-12-11 DIAGNOSIS — E1165 Type 2 diabetes mellitus with hyperglycemia: Secondary | ICD-10-CM | POA: Diagnosis not present

## 2018-12-11 DIAGNOSIS — F4310 Post-traumatic stress disorder, unspecified: Secondary | ICD-10-CM | POA: Diagnosis not present

## 2018-12-11 DIAGNOSIS — N189 Chronic kidney disease, unspecified: Secondary | ICD-10-CM | POA: Diagnosis not present

## 2018-12-12 DIAGNOSIS — I10 Essential (primary) hypertension: Secondary | ICD-10-CM | POA: Diagnosis not present

## 2018-12-12 DIAGNOSIS — E1165 Type 2 diabetes mellitus with hyperglycemia: Secondary | ICD-10-CM | POA: Diagnosis not present

## 2018-12-12 DIAGNOSIS — M545 Low back pain: Secondary | ICD-10-CM | POA: Diagnosis not present

## 2018-12-12 DIAGNOSIS — N189 Chronic kidney disease, unspecified: Secondary | ICD-10-CM | POA: Diagnosis not present

## 2018-12-12 DIAGNOSIS — F4310 Post-traumatic stress disorder, unspecified: Secondary | ICD-10-CM | POA: Diagnosis not present

## 2018-12-13 DIAGNOSIS — E1165 Type 2 diabetes mellitus with hyperglycemia: Secondary | ICD-10-CM | POA: Diagnosis not present

## 2018-12-13 DIAGNOSIS — I10 Essential (primary) hypertension: Secondary | ICD-10-CM | POA: Diagnosis not present

## 2018-12-13 DIAGNOSIS — F4310 Post-traumatic stress disorder, unspecified: Secondary | ICD-10-CM | POA: Diagnosis not present

## 2018-12-13 DIAGNOSIS — N189 Chronic kidney disease, unspecified: Secondary | ICD-10-CM | POA: Diagnosis not present

## 2018-12-13 DIAGNOSIS — M545 Low back pain: Secondary | ICD-10-CM | POA: Diagnosis not present

## 2018-12-14 DIAGNOSIS — N189 Chronic kidney disease, unspecified: Secondary | ICD-10-CM | POA: Diagnosis not present

## 2018-12-14 DIAGNOSIS — I10 Essential (primary) hypertension: Secondary | ICD-10-CM | POA: Diagnosis not present

## 2018-12-14 DIAGNOSIS — F4310 Post-traumatic stress disorder, unspecified: Secondary | ICD-10-CM | POA: Diagnosis not present

## 2018-12-14 DIAGNOSIS — E1165 Type 2 diabetes mellitus with hyperglycemia: Secondary | ICD-10-CM | POA: Diagnosis not present

## 2018-12-14 DIAGNOSIS — M545 Low back pain: Secondary | ICD-10-CM | POA: Diagnosis not present

## 2018-12-15 DIAGNOSIS — F4310 Post-traumatic stress disorder, unspecified: Secondary | ICD-10-CM | POA: Diagnosis not present

## 2018-12-15 DIAGNOSIS — N189 Chronic kidney disease, unspecified: Secondary | ICD-10-CM | POA: Diagnosis not present

## 2018-12-15 DIAGNOSIS — M545 Low back pain: Secondary | ICD-10-CM | POA: Diagnosis not present

## 2018-12-15 DIAGNOSIS — E1165 Type 2 diabetes mellitus with hyperglycemia: Secondary | ICD-10-CM | POA: Diagnosis not present

## 2018-12-15 DIAGNOSIS — I10 Essential (primary) hypertension: Secondary | ICD-10-CM | POA: Diagnosis not present

## 2018-12-28 DIAGNOSIS — M25571 Pain in right ankle and joints of right foot: Secondary | ICD-10-CM | POA: Diagnosis not present

## 2018-12-28 DIAGNOSIS — M79604 Pain in right leg: Secondary | ICD-10-CM | POA: Diagnosis not present

## 2018-12-28 DIAGNOSIS — M79605 Pain in left leg: Secondary | ICD-10-CM | POA: Diagnosis not present

## 2018-12-28 DIAGNOSIS — M25572 Pain in left ankle and joints of left foot: Secondary | ICD-10-CM | POA: Diagnosis not present

## 2019-01-03 DIAGNOSIS — N189 Chronic kidney disease, unspecified: Secondary | ICD-10-CM | POA: Diagnosis not present

## 2019-01-03 DIAGNOSIS — E1165 Type 2 diabetes mellitus with hyperglycemia: Secondary | ICD-10-CM | POA: Diagnosis not present

## 2019-01-03 DIAGNOSIS — I10 Essential (primary) hypertension: Secondary | ICD-10-CM | POA: Diagnosis not present

## 2019-01-03 DIAGNOSIS — F4310 Post-traumatic stress disorder, unspecified: Secondary | ICD-10-CM | POA: Diagnosis not present

## 2019-01-03 DIAGNOSIS — M545 Low back pain: Secondary | ICD-10-CM | POA: Diagnosis not present

## 2019-01-04 DIAGNOSIS — N189 Chronic kidney disease, unspecified: Secondary | ICD-10-CM | POA: Diagnosis not present

## 2019-01-04 DIAGNOSIS — I10 Essential (primary) hypertension: Secondary | ICD-10-CM | POA: Diagnosis not present

## 2019-01-04 DIAGNOSIS — F4310 Post-traumatic stress disorder, unspecified: Secondary | ICD-10-CM | POA: Diagnosis not present

## 2019-01-04 DIAGNOSIS — M545 Low back pain: Secondary | ICD-10-CM | POA: Diagnosis not present

## 2019-01-04 DIAGNOSIS — E1165 Type 2 diabetes mellitus with hyperglycemia: Secondary | ICD-10-CM | POA: Diagnosis not present

## 2019-01-05 DIAGNOSIS — N189 Chronic kidney disease, unspecified: Secondary | ICD-10-CM | POA: Diagnosis not present

## 2019-01-05 DIAGNOSIS — F4310 Post-traumatic stress disorder, unspecified: Secondary | ICD-10-CM | POA: Diagnosis not present

## 2019-01-05 DIAGNOSIS — E1165 Type 2 diabetes mellitus with hyperglycemia: Secondary | ICD-10-CM | POA: Diagnosis not present

## 2019-01-05 DIAGNOSIS — I10 Essential (primary) hypertension: Secondary | ICD-10-CM | POA: Diagnosis not present

## 2019-01-05 DIAGNOSIS — M545 Low back pain: Secondary | ICD-10-CM | POA: Diagnosis not present

## 2019-01-09 ENCOUNTER — Telehealth: Payer: Self-pay | Admitting: Cardiology

## 2019-01-09 NOTE — Telephone Encounter (Signed)
New message:     I called patient for a appt. Patient has moved to [facility] and he would like a referral. Please call patient.

## 2019-01-09 NOTE — Telephone Encounter (Signed)
Spoke with the patient and made him aware of Dr. Allison Quarry response. Patient verbalized understanding and voiced appreciation for the call.

## 2019-01-09 NOTE — Telephone Encounter (Signed)
I have not seen this patient in almost 2 years and only saw him once at that time.  My recommendation would be for him to be established with a nephrologist and primary care doctor locally in [facility] and let them recommend a cardiologist.  There are some new cardiologist there, but I do not know any personally.  Glenetta Hew, MD

## 2019-01-16 DIAGNOSIS — M25572 Pain in left ankle and joints of left foot: Secondary | ICD-10-CM | POA: Diagnosis not present

## 2019-01-16 DIAGNOSIS — M25571 Pain in right ankle and joints of right foot: Secondary | ICD-10-CM | POA: Diagnosis not present

## 2019-01-16 DIAGNOSIS — M79604 Pain in right leg: Secondary | ICD-10-CM | POA: Diagnosis not present

## 2019-01-16 DIAGNOSIS — M79671 Pain in right foot: Secondary | ICD-10-CM | POA: Diagnosis not present

## 2019-01-16 DIAGNOSIS — J3081 Allergic rhinitis due to animal (cat) (dog) hair and dander: Secondary | ICD-10-CM | POA: Diagnosis not present

## 2019-01-16 DIAGNOSIS — J301 Allergic rhinitis due to pollen: Secondary | ICD-10-CM | POA: Diagnosis not present

## 2019-01-16 DIAGNOSIS — J309 Allergic rhinitis, unspecified: Secondary | ICD-10-CM | POA: Diagnosis not present

## 2019-01-16 DIAGNOSIS — R208 Other disturbances of skin sensation: Secondary | ICD-10-CM | POA: Diagnosis not present

## 2019-01-16 DIAGNOSIS — M79605 Pain in left leg: Secondary | ICD-10-CM | POA: Diagnosis not present

## 2019-01-16 DIAGNOSIS — M545 Low back pain: Secondary | ICD-10-CM | POA: Diagnosis not present

## 2019-01-16 DIAGNOSIS — M79672 Pain in left foot: Secondary | ICD-10-CM | POA: Diagnosis not present

## 2019-01-16 DIAGNOSIS — G894 Chronic pain syndrome: Secondary | ICD-10-CM | POA: Diagnosis not present

## 2019-01-17 DIAGNOSIS — I6522 Occlusion and stenosis of left carotid artery: Secondary | ICD-10-CM | POA: Diagnosis not present

## 2019-01-17 DIAGNOSIS — N184 Chronic kidney disease, stage 4 (severe): Secondary | ICD-10-CM | POA: Diagnosis not present

## 2019-01-17 DIAGNOSIS — N185 Chronic kidney disease, stage 5: Secondary | ICD-10-CM | POA: Diagnosis not present

## 2019-01-17 DIAGNOSIS — E119 Type 2 diabetes mellitus without complications: Secondary | ICD-10-CM | POA: Diagnosis not present

## 2019-01-17 DIAGNOSIS — N189 Chronic kidney disease, unspecified: Secondary | ICD-10-CM | POA: Diagnosis not present

## 2019-01-17 DIAGNOSIS — I509 Heart failure, unspecified: Secondary | ICD-10-CM | POA: Diagnosis not present

## 2019-01-17 DIAGNOSIS — I10 Essential (primary) hypertension: Secondary | ICD-10-CM | POA: Diagnosis not present

## 2019-01-17 DIAGNOSIS — E785 Hyperlipidemia, unspecified: Secondary | ICD-10-CM | POA: Diagnosis not present

## 2019-01-17 DIAGNOSIS — D631 Anemia in chronic kidney disease: Secondary | ICD-10-CM | POA: Diagnosis not present

## 2019-01-17 DIAGNOSIS — G629 Polyneuropathy, unspecified: Secondary | ICD-10-CM | POA: Diagnosis not present

## 2019-01-21 DIAGNOSIS — E1165 Type 2 diabetes mellitus with hyperglycemia: Secondary | ICD-10-CM | POA: Diagnosis not present

## 2019-01-21 DIAGNOSIS — F4310 Post-traumatic stress disorder, unspecified: Secondary | ICD-10-CM | POA: Diagnosis not present

## 2019-01-21 DIAGNOSIS — I10 Essential (primary) hypertension: Secondary | ICD-10-CM | POA: Diagnosis not present

## 2019-01-21 DIAGNOSIS — N189 Chronic kidney disease, unspecified: Secondary | ICD-10-CM | POA: Diagnosis not present

## 2019-01-21 DIAGNOSIS — M545 Low back pain: Secondary | ICD-10-CM | POA: Diagnosis not present

## 2019-01-22 DIAGNOSIS — I10 Essential (primary) hypertension: Secondary | ICD-10-CM | POA: Diagnosis not present

## 2019-01-22 DIAGNOSIS — M545 Low back pain: Secondary | ICD-10-CM | POA: Diagnosis not present

## 2019-01-22 DIAGNOSIS — E1165 Type 2 diabetes mellitus with hyperglycemia: Secondary | ICD-10-CM | POA: Diagnosis not present

## 2019-01-22 DIAGNOSIS — N189 Chronic kidney disease, unspecified: Secondary | ICD-10-CM | POA: Diagnosis not present

## 2019-01-22 DIAGNOSIS — F4310 Post-traumatic stress disorder, unspecified: Secondary | ICD-10-CM | POA: Diagnosis not present

## 2019-01-23 DIAGNOSIS — I10 Essential (primary) hypertension: Secondary | ICD-10-CM | POA: Diagnosis not present

## 2019-01-23 DIAGNOSIS — M545 Low back pain: Secondary | ICD-10-CM | POA: Diagnosis not present

## 2019-01-23 DIAGNOSIS — E1165 Type 2 diabetes mellitus with hyperglycemia: Secondary | ICD-10-CM | POA: Diagnosis not present

## 2019-01-23 DIAGNOSIS — F4310 Post-traumatic stress disorder, unspecified: Secondary | ICD-10-CM | POA: Diagnosis not present

## 2019-01-23 DIAGNOSIS — N189 Chronic kidney disease, unspecified: Secondary | ICD-10-CM | POA: Diagnosis not present

## 2019-01-24 DIAGNOSIS — M545 Low back pain: Secondary | ICD-10-CM | POA: Diagnosis not present

## 2019-01-24 DIAGNOSIS — N189 Chronic kidney disease, unspecified: Secondary | ICD-10-CM | POA: Diagnosis not present

## 2019-01-24 DIAGNOSIS — I10 Essential (primary) hypertension: Secondary | ICD-10-CM | POA: Diagnosis not present

## 2019-01-24 DIAGNOSIS — E1165 Type 2 diabetes mellitus with hyperglycemia: Secondary | ICD-10-CM | POA: Diagnosis not present

## 2019-01-24 DIAGNOSIS — F4310 Post-traumatic stress disorder, unspecified: Secondary | ICD-10-CM | POA: Diagnosis not present

## 2019-01-25 DIAGNOSIS — E1165 Type 2 diabetes mellitus with hyperglycemia: Secondary | ICD-10-CM | POA: Diagnosis not present

## 2019-01-25 DIAGNOSIS — N189 Chronic kidney disease, unspecified: Secondary | ICD-10-CM | POA: Diagnosis not present

## 2019-01-25 DIAGNOSIS — I10 Essential (primary) hypertension: Secondary | ICD-10-CM | POA: Diagnosis not present

## 2019-01-25 DIAGNOSIS — M545 Low back pain: Secondary | ICD-10-CM | POA: Diagnosis not present

## 2019-01-25 DIAGNOSIS — F4310 Post-traumatic stress disorder, unspecified: Secondary | ICD-10-CM | POA: Diagnosis not present

## 2019-01-26 DIAGNOSIS — E1165 Type 2 diabetes mellitus with hyperglycemia: Secondary | ICD-10-CM | POA: Diagnosis not present

## 2019-01-26 DIAGNOSIS — N189 Chronic kidney disease, unspecified: Secondary | ICD-10-CM | POA: Diagnosis not present

## 2019-01-26 DIAGNOSIS — F4310 Post-traumatic stress disorder, unspecified: Secondary | ICD-10-CM | POA: Diagnosis not present

## 2019-01-26 DIAGNOSIS — I10 Essential (primary) hypertension: Secondary | ICD-10-CM | POA: Diagnosis not present

## 2019-01-26 DIAGNOSIS — M545 Low back pain: Secondary | ICD-10-CM | POA: Diagnosis not present

## 2019-01-28 DIAGNOSIS — F4310 Post-traumatic stress disorder, unspecified: Secondary | ICD-10-CM | POA: Diagnosis not present

## 2019-01-28 DIAGNOSIS — I509 Heart failure, unspecified: Secondary | ICD-10-CM | POA: Diagnosis not present

## 2019-01-28 DIAGNOSIS — N189 Chronic kidney disease, unspecified: Secondary | ICD-10-CM | POA: Diagnosis not present

## 2019-01-28 DIAGNOSIS — M545 Low back pain: Secondary | ICD-10-CM | POA: Diagnosis not present

## 2019-01-28 DIAGNOSIS — G629 Polyneuropathy, unspecified: Secondary | ICD-10-CM | POA: Diagnosis not present

## 2019-01-28 DIAGNOSIS — E785 Hyperlipidemia, unspecified: Secondary | ICD-10-CM | POA: Diagnosis not present

## 2019-01-28 DIAGNOSIS — E1122 Type 2 diabetes mellitus with diabetic chronic kidney disease: Secondary | ICD-10-CM | POA: Diagnosis not present

## 2019-01-28 DIAGNOSIS — E1165 Type 2 diabetes mellitus with hyperglycemia: Secondary | ICD-10-CM | POA: Diagnosis not present

## 2019-01-28 DIAGNOSIS — Z7984 Long term (current) use of oral hypoglycemic drugs: Secondary | ICD-10-CM | POA: Diagnosis not present

## 2019-01-28 DIAGNOSIS — D631 Anemia in chronic kidney disease: Secondary | ICD-10-CM | POA: Diagnosis not present

## 2019-01-28 DIAGNOSIS — I10 Essential (primary) hypertension: Secondary | ICD-10-CM | POA: Diagnosis not present

## 2019-01-28 DIAGNOSIS — N185 Chronic kidney disease, stage 5: Secondary | ICD-10-CM | POA: Diagnosis not present

## 2019-01-28 DIAGNOSIS — I120 Hypertensive chronic kidney disease with stage 5 chronic kidney disease or end stage renal disease: Secondary | ICD-10-CM | POA: Diagnosis not present

## 2019-01-29 DIAGNOSIS — M7752 Other enthesopathy of left foot: Secondary | ICD-10-CM | POA: Diagnosis not present

## 2019-01-29 DIAGNOSIS — L97521 Non-pressure chronic ulcer of other part of left foot limited to breakdown of skin: Secondary | ICD-10-CM | POA: Diagnosis not present

## 2019-01-29 DIAGNOSIS — B351 Tinea unguium: Secondary | ICD-10-CM | POA: Diagnosis not present

## 2019-01-29 DIAGNOSIS — M24575 Contracture, left foot: Secondary | ICD-10-CM | POA: Diagnosis not present

## 2019-01-29 DIAGNOSIS — M79674 Pain in right toe(s): Secondary | ICD-10-CM | POA: Diagnosis not present

## 2019-01-29 DIAGNOSIS — E1165 Type 2 diabetes mellitus with hyperglycemia: Secondary | ICD-10-CM | POA: Diagnosis not present

## 2019-01-29 DIAGNOSIS — M545 Low back pain: Secondary | ICD-10-CM | POA: Diagnosis not present

## 2019-01-29 DIAGNOSIS — N189 Chronic kidney disease, unspecified: Secondary | ICD-10-CM | POA: Diagnosis not present

## 2019-01-29 DIAGNOSIS — Z23 Encounter for immunization: Secondary | ICD-10-CM | POA: Diagnosis not present

## 2019-01-29 DIAGNOSIS — L89891 Pressure ulcer of other site, stage 1: Secondary | ICD-10-CM | POA: Diagnosis not present

## 2019-01-29 DIAGNOSIS — F4310 Post-traumatic stress disorder, unspecified: Secondary | ICD-10-CM | POA: Diagnosis not present

## 2019-01-29 DIAGNOSIS — M79675 Pain in left toe(s): Secondary | ICD-10-CM | POA: Diagnosis not present

## 2019-01-29 DIAGNOSIS — I10 Essential (primary) hypertension: Secondary | ICD-10-CM | POA: Diagnosis not present

## 2019-01-29 DIAGNOSIS — M86172 Other acute osteomyelitis, left ankle and foot: Secondary | ICD-10-CM | POA: Diagnosis not present

## 2019-01-30 DIAGNOSIS — N189 Chronic kidney disease, unspecified: Secondary | ICD-10-CM | POA: Diagnosis not present

## 2019-01-30 DIAGNOSIS — M545 Low back pain: Secondary | ICD-10-CM | POA: Diagnosis not present

## 2019-01-30 DIAGNOSIS — I10 Essential (primary) hypertension: Secondary | ICD-10-CM | POA: Diagnosis not present

## 2019-01-30 DIAGNOSIS — F4310 Post-traumatic stress disorder, unspecified: Secondary | ICD-10-CM | POA: Diagnosis not present

## 2019-01-30 DIAGNOSIS — E1165 Type 2 diabetes mellitus with hyperglycemia: Secondary | ICD-10-CM | POA: Diagnosis not present

## 2019-01-31 DIAGNOSIS — E1165 Type 2 diabetes mellitus with hyperglycemia: Secondary | ICD-10-CM | POA: Diagnosis not present

## 2019-01-31 DIAGNOSIS — M545 Low back pain: Secondary | ICD-10-CM | POA: Diagnosis not present

## 2019-01-31 DIAGNOSIS — F4310 Post-traumatic stress disorder, unspecified: Secondary | ICD-10-CM | POA: Diagnosis not present

## 2019-01-31 DIAGNOSIS — N189 Chronic kidney disease, unspecified: Secondary | ICD-10-CM | POA: Diagnosis not present

## 2019-01-31 DIAGNOSIS — I10 Essential (primary) hypertension: Secondary | ICD-10-CM | POA: Diagnosis not present

## 2019-02-01 DIAGNOSIS — I10 Essential (primary) hypertension: Secondary | ICD-10-CM | POA: Diagnosis not present

## 2019-02-01 DIAGNOSIS — F4310 Post-traumatic stress disorder, unspecified: Secondary | ICD-10-CM | POA: Diagnosis not present

## 2019-02-01 DIAGNOSIS — N189 Chronic kidney disease, unspecified: Secondary | ICD-10-CM | POA: Diagnosis not present

## 2019-02-01 DIAGNOSIS — E1165 Type 2 diabetes mellitus with hyperglycemia: Secondary | ICD-10-CM | POA: Diagnosis not present

## 2019-02-01 DIAGNOSIS — M545 Low back pain: Secondary | ICD-10-CM | POA: Diagnosis not present

## 2019-02-02 DIAGNOSIS — N189 Chronic kidney disease, unspecified: Secondary | ICD-10-CM | POA: Diagnosis not present

## 2019-02-02 DIAGNOSIS — M545 Low back pain: Secondary | ICD-10-CM | POA: Diagnosis not present

## 2019-02-02 DIAGNOSIS — E1165 Type 2 diabetes mellitus with hyperglycemia: Secondary | ICD-10-CM | POA: Diagnosis not present

## 2019-02-02 DIAGNOSIS — I10 Essential (primary) hypertension: Secondary | ICD-10-CM | POA: Diagnosis not present

## 2019-02-02 DIAGNOSIS — F4310 Post-traumatic stress disorder, unspecified: Secondary | ICD-10-CM | POA: Diagnosis not present

## 2019-02-04 DIAGNOSIS — E1165 Type 2 diabetes mellitus with hyperglycemia: Secondary | ICD-10-CM | POA: Diagnosis not present

## 2019-02-04 DIAGNOSIS — M545 Low back pain: Secondary | ICD-10-CM | POA: Diagnosis not present

## 2019-02-04 DIAGNOSIS — I10 Essential (primary) hypertension: Secondary | ICD-10-CM | POA: Diagnosis not present

## 2019-02-04 DIAGNOSIS — F4310 Post-traumatic stress disorder, unspecified: Secondary | ICD-10-CM | POA: Diagnosis not present

## 2019-02-04 DIAGNOSIS — N189 Chronic kidney disease, unspecified: Secondary | ICD-10-CM | POA: Diagnosis not present

## 2019-02-05 DIAGNOSIS — E1165 Type 2 diabetes mellitus with hyperglycemia: Secondary | ICD-10-CM | POA: Diagnosis not present

## 2019-02-05 DIAGNOSIS — N189 Chronic kidney disease, unspecified: Secondary | ICD-10-CM | POA: Diagnosis not present

## 2019-02-05 DIAGNOSIS — I10 Essential (primary) hypertension: Secondary | ICD-10-CM | POA: Diagnosis not present

## 2019-02-05 DIAGNOSIS — M545 Low back pain: Secondary | ICD-10-CM | POA: Diagnosis not present

## 2019-02-05 DIAGNOSIS — F4310 Post-traumatic stress disorder, unspecified: Secondary | ICD-10-CM | POA: Diagnosis not present

## 2019-02-06 DIAGNOSIS — E1165 Type 2 diabetes mellitus with hyperglycemia: Secondary | ICD-10-CM | POA: Diagnosis not present

## 2019-02-06 DIAGNOSIS — I10 Essential (primary) hypertension: Secondary | ICD-10-CM | POA: Diagnosis not present

## 2019-02-06 DIAGNOSIS — M545 Low back pain: Secondary | ICD-10-CM | POA: Diagnosis not present

## 2019-02-06 DIAGNOSIS — N189 Chronic kidney disease, unspecified: Secondary | ICD-10-CM | POA: Diagnosis not present

## 2019-02-06 DIAGNOSIS — F4310 Post-traumatic stress disorder, unspecified: Secondary | ICD-10-CM | POA: Diagnosis not present

## 2019-02-07 DIAGNOSIS — E1165 Type 2 diabetes mellitus with hyperglycemia: Secondary | ICD-10-CM | POA: Diagnosis not present

## 2019-02-07 DIAGNOSIS — F4310 Post-traumatic stress disorder, unspecified: Secondary | ICD-10-CM | POA: Diagnosis not present

## 2019-02-07 DIAGNOSIS — M545 Low back pain: Secondary | ICD-10-CM | POA: Diagnosis not present

## 2019-02-07 DIAGNOSIS — I10 Essential (primary) hypertension: Secondary | ICD-10-CM | POA: Diagnosis not present

## 2019-02-07 DIAGNOSIS — N189 Chronic kidney disease, unspecified: Secondary | ICD-10-CM | POA: Diagnosis not present

## 2019-02-08 DIAGNOSIS — F4310 Post-traumatic stress disorder, unspecified: Secondary | ICD-10-CM | POA: Diagnosis not present

## 2019-02-08 DIAGNOSIS — I10 Essential (primary) hypertension: Secondary | ICD-10-CM | POA: Diagnosis not present

## 2019-02-08 DIAGNOSIS — N189 Chronic kidney disease, unspecified: Secondary | ICD-10-CM | POA: Diagnosis not present

## 2019-02-08 DIAGNOSIS — M545 Low back pain: Secondary | ICD-10-CM | POA: Diagnosis not present

## 2019-02-08 DIAGNOSIS — E1165 Type 2 diabetes mellitus with hyperglycemia: Secondary | ICD-10-CM | POA: Diagnosis not present

## 2019-02-09 DIAGNOSIS — M545 Low back pain: Secondary | ICD-10-CM | POA: Diagnosis not present

## 2019-02-09 DIAGNOSIS — I10 Essential (primary) hypertension: Secondary | ICD-10-CM | POA: Diagnosis not present

## 2019-02-09 DIAGNOSIS — F4310 Post-traumatic stress disorder, unspecified: Secondary | ICD-10-CM | POA: Diagnosis not present

## 2019-02-09 DIAGNOSIS — E1165 Type 2 diabetes mellitus with hyperglycemia: Secondary | ICD-10-CM | POA: Diagnosis not present

## 2019-02-09 DIAGNOSIS — N189 Chronic kidney disease, unspecified: Secondary | ICD-10-CM | POA: Diagnosis not present

## 2019-02-11 DIAGNOSIS — N189 Chronic kidney disease, unspecified: Secondary | ICD-10-CM | POA: Diagnosis not present

## 2019-02-11 DIAGNOSIS — I509 Heart failure, unspecified: Secondary | ICD-10-CM | POA: Diagnosis not present

## 2019-02-11 DIAGNOSIS — G629 Polyneuropathy, unspecified: Secondary | ICD-10-CM | POA: Diagnosis not present

## 2019-02-11 DIAGNOSIS — I6522 Occlusion and stenosis of left carotid artery: Secondary | ICD-10-CM | POA: Diagnosis not present

## 2019-02-11 DIAGNOSIS — N185 Chronic kidney disease, stage 5: Secondary | ICD-10-CM | POA: Diagnosis not present

## 2019-02-11 DIAGNOSIS — E1165 Type 2 diabetes mellitus with hyperglycemia: Secondary | ICD-10-CM | POA: Diagnosis not present

## 2019-02-11 DIAGNOSIS — E119 Type 2 diabetes mellitus without complications: Secondary | ICD-10-CM | POA: Diagnosis not present

## 2019-02-11 DIAGNOSIS — M545 Low back pain: Secondary | ICD-10-CM | POA: Diagnosis not present

## 2019-02-11 DIAGNOSIS — F4310 Post-traumatic stress disorder, unspecified: Secondary | ICD-10-CM | POA: Diagnosis not present

## 2019-02-11 DIAGNOSIS — I10 Essential (primary) hypertension: Secondary | ICD-10-CM | POA: Diagnosis not present

## 2019-02-11 DIAGNOSIS — E785 Hyperlipidemia, unspecified: Secondary | ICD-10-CM | POA: Diagnosis not present

## 2019-02-11 DIAGNOSIS — D631 Anemia in chronic kidney disease: Secondary | ICD-10-CM | POA: Diagnosis not present

## 2019-02-12 DIAGNOSIS — F4310 Post-traumatic stress disorder, unspecified: Secondary | ICD-10-CM | POA: Diagnosis not present

## 2019-02-12 DIAGNOSIS — E1165 Type 2 diabetes mellitus with hyperglycemia: Secondary | ICD-10-CM | POA: Diagnosis not present

## 2019-02-12 DIAGNOSIS — M545 Low back pain: Secondary | ICD-10-CM | POA: Diagnosis not present

## 2019-02-12 DIAGNOSIS — N189 Chronic kidney disease, unspecified: Secondary | ICD-10-CM | POA: Diagnosis not present

## 2019-02-12 DIAGNOSIS — I10 Essential (primary) hypertension: Secondary | ICD-10-CM | POA: Diagnosis not present

## 2019-02-13 DIAGNOSIS — N189 Chronic kidney disease, unspecified: Secondary | ICD-10-CM | POA: Diagnosis not present

## 2019-02-13 DIAGNOSIS — M545 Low back pain: Secondary | ICD-10-CM | POA: Diagnosis not present

## 2019-02-13 DIAGNOSIS — F4310 Post-traumatic stress disorder, unspecified: Secondary | ICD-10-CM | POA: Diagnosis not present

## 2019-02-13 DIAGNOSIS — I10 Essential (primary) hypertension: Secondary | ICD-10-CM | POA: Diagnosis not present

## 2019-02-13 DIAGNOSIS — E1165 Type 2 diabetes mellitus with hyperglycemia: Secondary | ICD-10-CM | POA: Diagnosis not present

## 2019-02-14 DIAGNOSIS — E1165 Type 2 diabetes mellitus with hyperglycemia: Secondary | ICD-10-CM | POA: Diagnosis not present

## 2019-02-14 DIAGNOSIS — N189 Chronic kidney disease, unspecified: Secondary | ICD-10-CM | POA: Diagnosis not present

## 2019-02-14 DIAGNOSIS — I10 Essential (primary) hypertension: Secondary | ICD-10-CM | POA: Diagnosis not present

## 2019-02-14 DIAGNOSIS — F4310 Post-traumatic stress disorder, unspecified: Secondary | ICD-10-CM | POA: Diagnosis not present

## 2019-02-14 DIAGNOSIS — M545 Low back pain: Secondary | ICD-10-CM | POA: Diagnosis not present

## 2019-02-15 DIAGNOSIS — M545 Low back pain: Secondary | ICD-10-CM | POA: Diagnosis not present

## 2019-02-15 DIAGNOSIS — F4310 Post-traumatic stress disorder, unspecified: Secondary | ICD-10-CM | POA: Diagnosis not present

## 2019-02-15 DIAGNOSIS — N189 Chronic kidney disease, unspecified: Secondary | ICD-10-CM | POA: Diagnosis not present

## 2019-02-15 DIAGNOSIS — E1165 Type 2 diabetes mellitus with hyperglycemia: Secondary | ICD-10-CM | POA: Diagnosis not present

## 2019-02-15 DIAGNOSIS — I10 Essential (primary) hypertension: Secondary | ICD-10-CM | POA: Diagnosis not present

## 2019-02-16 DIAGNOSIS — I10 Essential (primary) hypertension: Secondary | ICD-10-CM | POA: Diagnosis not present

## 2019-02-16 DIAGNOSIS — E1165 Type 2 diabetes mellitus with hyperglycemia: Secondary | ICD-10-CM | POA: Diagnosis not present

## 2019-02-16 DIAGNOSIS — N189 Chronic kidney disease, unspecified: Secondary | ICD-10-CM | POA: Diagnosis not present

## 2019-02-16 DIAGNOSIS — F4310 Post-traumatic stress disorder, unspecified: Secondary | ICD-10-CM | POA: Diagnosis not present

## 2019-02-16 DIAGNOSIS — M545 Low back pain: Secondary | ICD-10-CM | POA: Diagnosis not present

## 2019-02-18 DIAGNOSIS — M545 Low back pain: Secondary | ICD-10-CM | POA: Diagnosis not present

## 2019-02-18 DIAGNOSIS — N189 Chronic kidney disease, unspecified: Secondary | ICD-10-CM | POA: Diagnosis not present

## 2019-02-18 DIAGNOSIS — I10 Essential (primary) hypertension: Secondary | ICD-10-CM | POA: Diagnosis not present

## 2019-02-18 DIAGNOSIS — E1165 Type 2 diabetes mellitus with hyperglycemia: Secondary | ICD-10-CM | POA: Diagnosis not present

## 2019-02-18 DIAGNOSIS — F4310 Post-traumatic stress disorder, unspecified: Secondary | ICD-10-CM | POA: Diagnosis not present

## 2019-02-19 DIAGNOSIS — M545 Low back pain: Secondary | ICD-10-CM | POA: Diagnosis not present

## 2019-02-19 DIAGNOSIS — E1165 Type 2 diabetes mellitus with hyperglycemia: Secondary | ICD-10-CM | POA: Diagnosis not present

## 2019-02-19 DIAGNOSIS — I10 Essential (primary) hypertension: Secondary | ICD-10-CM | POA: Diagnosis not present

## 2019-02-19 DIAGNOSIS — F4310 Post-traumatic stress disorder, unspecified: Secondary | ICD-10-CM | POA: Diagnosis not present

## 2019-02-19 DIAGNOSIS — N189 Chronic kidney disease, unspecified: Secondary | ICD-10-CM | POA: Diagnosis not present

## 2019-02-20 DIAGNOSIS — E1165 Type 2 diabetes mellitus with hyperglycemia: Secondary | ICD-10-CM | POA: Diagnosis not present

## 2019-02-20 DIAGNOSIS — F4310 Post-traumatic stress disorder, unspecified: Secondary | ICD-10-CM | POA: Diagnosis not present

## 2019-02-20 DIAGNOSIS — N189 Chronic kidney disease, unspecified: Secondary | ICD-10-CM | POA: Diagnosis not present

## 2019-02-20 DIAGNOSIS — I10 Essential (primary) hypertension: Secondary | ICD-10-CM | POA: Diagnosis not present

## 2019-02-20 DIAGNOSIS — M545 Low back pain: Secondary | ICD-10-CM | POA: Diagnosis not present

## 2019-02-21 DIAGNOSIS — M79604 Pain in right leg: Secondary | ICD-10-CM | POA: Diagnosis not present

## 2019-02-21 DIAGNOSIS — I10 Essential (primary) hypertension: Secondary | ICD-10-CM | POA: Diagnosis not present

## 2019-02-21 DIAGNOSIS — E1165 Type 2 diabetes mellitus with hyperglycemia: Secondary | ICD-10-CM | POA: Diagnosis not present

## 2019-02-21 DIAGNOSIS — F4310 Post-traumatic stress disorder, unspecified: Secondary | ICD-10-CM | POA: Diagnosis not present

## 2019-02-21 DIAGNOSIS — N189 Chronic kidney disease, unspecified: Secondary | ICD-10-CM | POA: Diagnosis not present

## 2019-02-21 DIAGNOSIS — M79605 Pain in left leg: Secondary | ICD-10-CM | POA: Diagnosis not present

## 2019-02-21 DIAGNOSIS — M25571 Pain in right ankle and joints of right foot: Secondary | ICD-10-CM | POA: Diagnosis not present

## 2019-02-21 DIAGNOSIS — M545 Low back pain: Secondary | ICD-10-CM | POA: Diagnosis not present

## 2019-02-22 DIAGNOSIS — F4310 Post-traumatic stress disorder, unspecified: Secondary | ICD-10-CM | POA: Diagnosis not present

## 2019-02-22 DIAGNOSIS — E559 Vitamin D deficiency, unspecified: Secondary | ICD-10-CM | POA: Diagnosis not present

## 2019-02-22 DIAGNOSIS — E1165 Type 2 diabetes mellitus with hyperglycemia: Secondary | ICD-10-CM | POA: Diagnosis not present

## 2019-02-22 DIAGNOSIS — N189 Chronic kidney disease, unspecified: Secondary | ICD-10-CM | POA: Diagnosis not present

## 2019-02-22 DIAGNOSIS — Z719 Counseling, unspecified: Secondary | ICD-10-CM | POA: Diagnosis not present

## 2019-02-22 DIAGNOSIS — M545 Low back pain: Secondary | ICD-10-CM | POA: Diagnosis not present

## 2019-02-22 DIAGNOSIS — E1121 Type 2 diabetes mellitus with diabetic nephropathy: Secondary | ICD-10-CM | POA: Diagnosis not present

## 2019-02-22 DIAGNOSIS — I10 Essential (primary) hypertension: Secondary | ICD-10-CM | POA: Diagnosis not present

## 2019-02-23 DIAGNOSIS — E1165 Type 2 diabetes mellitus with hyperglycemia: Secondary | ICD-10-CM | POA: Diagnosis not present

## 2019-02-23 DIAGNOSIS — N189 Chronic kidney disease, unspecified: Secondary | ICD-10-CM | POA: Diagnosis not present

## 2019-02-23 DIAGNOSIS — M545 Low back pain: Secondary | ICD-10-CM | POA: Diagnosis not present

## 2019-02-23 DIAGNOSIS — F4310 Post-traumatic stress disorder, unspecified: Secondary | ICD-10-CM | POA: Diagnosis not present

## 2019-02-23 DIAGNOSIS — I10 Essential (primary) hypertension: Secondary | ICD-10-CM | POA: Diagnosis not present

## 2019-02-25 DIAGNOSIS — M545 Low back pain: Secondary | ICD-10-CM | POA: Diagnosis not present

## 2019-02-25 DIAGNOSIS — E1165 Type 2 diabetes mellitus with hyperglycemia: Secondary | ICD-10-CM | POA: Diagnosis not present

## 2019-02-25 DIAGNOSIS — N189 Chronic kidney disease, unspecified: Secondary | ICD-10-CM | POA: Diagnosis not present

## 2019-02-25 DIAGNOSIS — F4310 Post-traumatic stress disorder, unspecified: Secondary | ICD-10-CM | POA: Diagnosis not present

## 2019-02-25 DIAGNOSIS — I10 Essential (primary) hypertension: Secondary | ICD-10-CM | POA: Diagnosis not present

## 2019-02-26 DIAGNOSIS — I10 Essential (primary) hypertension: Secondary | ICD-10-CM | POA: Diagnosis not present

## 2019-02-26 DIAGNOSIS — N189 Chronic kidney disease, unspecified: Secondary | ICD-10-CM | POA: Diagnosis not present

## 2019-02-26 DIAGNOSIS — M545 Low back pain: Secondary | ICD-10-CM | POA: Diagnosis not present

## 2019-02-26 DIAGNOSIS — E1165 Type 2 diabetes mellitus with hyperglycemia: Secondary | ICD-10-CM | POA: Diagnosis not present

## 2019-02-26 DIAGNOSIS — F4310 Post-traumatic stress disorder, unspecified: Secondary | ICD-10-CM | POA: Diagnosis not present

## 2019-02-27 DIAGNOSIS — I10 Essential (primary) hypertension: Secondary | ICD-10-CM | POA: Diagnosis not present

## 2019-02-27 DIAGNOSIS — F4310 Post-traumatic stress disorder, unspecified: Secondary | ICD-10-CM | POA: Diagnosis not present

## 2019-02-27 DIAGNOSIS — M545 Low back pain: Secondary | ICD-10-CM | POA: Diagnosis not present

## 2019-02-27 DIAGNOSIS — E1165 Type 2 diabetes mellitus with hyperglycemia: Secondary | ICD-10-CM | POA: Diagnosis not present

## 2019-02-27 DIAGNOSIS — N189 Chronic kidney disease, unspecified: Secondary | ICD-10-CM | POA: Diagnosis not present

## 2019-02-28 DIAGNOSIS — I10 Essential (primary) hypertension: Secondary | ICD-10-CM | POA: Diagnosis not present

## 2019-02-28 DIAGNOSIS — E1165 Type 2 diabetes mellitus with hyperglycemia: Secondary | ICD-10-CM | POA: Diagnosis not present

## 2019-02-28 DIAGNOSIS — F4310 Post-traumatic stress disorder, unspecified: Secondary | ICD-10-CM | POA: Diagnosis not present

## 2019-02-28 DIAGNOSIS — N189 Chronic kidney disease, unspecified: Secondary | ICD-10-CM | POA: Diagnosis not present

## 2019-02-28 DIAGNOSIS — M545 Low back pain: Secondary | ICD-10-CM | POA: Diagnosis not present

## 2019-03-01 DIAGNOSIS — N189 Chronic kidney disease, unspecified: Secondary | ICD-10-CM | POA: Diagnosis not present

## 2019-03-01 DIAGNOSIS — E1165 Type 2 diabetes mellitus with hyperglycemia: Secondary | ICD-10-CM | POA: Diagnosis not present

## 2019-03-01 DIAGNOSIS — I10 Essential (primary) hypertension: Secondary | ICD-10-CM | POA: Diagnosis not present

## 2019-03-01 DIAGNOSIS — M545 Low back pain: Secondary | ICD-10-CM | POA: Diagnosis not present

## 2019-03-01 DIAGNOSIS — F4310 Post-traumatic stress disorder, unspecified: Secondary | ICD-10-CM | POA: Diagnosis not present

## 2019-03-02 DIAGNOSIS — M545 Low back pain: Secondary | ICD-10-CM | POA: Diagnosis not present

## 2019-03-02 DIAGNOSIS — E1165 Type 2 diabetes mellitus with hyperglycemia: Secondary | ICD-10-CM | POA: Diagnosis not present

## 2019-03-02 DIAGNOSIS — N189 Chronic kidney disease, unspecified: Secondary | ICD-10-CM | POA: Diagnosis not present

## 2019-03-02 DIAGNOSIS — I10 Essential (primary) hypertension: Secondary | ICD-10-CM | POA: Diagnosis not present

## 2019-03-02 DIAGNOSIS — F4310 Post-traumatic stress disorder, unspecified: Secondary | ICD-10-CM | POA: Diagnosis not present

## 2019-03-04 DIAGNOSIS — I10 Essential (primary) hypertension: Secondary | ICD-10-CM | POA: Diagnosis not present

## 2019-03-04 DIAGNOSIS — M545 Low back pain: Secondary | ICD-10-CM | POA: Diagnosis not present

## 2019-03-04 DIAGNOSIS — N189 Chronic kidney disease, unspecified: Secondary | ICD-10-CM | POA: Diagnosis not present

## 2019-03-04 DIAGNOSIS — F4310 Post-traumatic stress disorder, unspecified: Secondary | ICD-10-CM | POA: Diagnosis not present

## 2019-03-04 DIAGNOSIS — E1165 Type 2 diabetes mellitus with hyperglycemia: Secondary | ICD-10-CM | POA: Diagnosis not present

## 2019-03-05 DIAGNOSIS — F4310 Post-traumatic stress disorder, unspecified: Secondary | ICD-10-CM | POA: Diagnosis not present

## 2019-03-05 DIAGNOSIS — M545 Low back pain: Secondary | ICD-10-CM | POA: Diagnosis not present

## 2019-03-05 DIAGNOSIS — I10 Essential (primary) hypertension: Secondary | ICD-10-CM | POA: Diagnosis not present

## 2019-03-05 DIAGNOSIS — E1165 Type 2 diabetes mellitus with hyperglycemia: Secondary | ICD-10-CM | POA: Diagnosis not present

## 2019-03-05 DIAGNOSIS — N189 Chronic kidney disease, unspecified: Secondary | ICD-10-CM | POA: Diagnosis not present

## 2019-03-06 DIAGNOSIS — E1165 Type 2 diabetes mellitus with hyperglycemia: Secondary | ICD-10-CM | POA: Diagnosis not present

## 2019-03-06 DIAGNOSIS — F4310 Post-traumatic stress disorder, unspecified: Secondary | ICD-10-CM | POA: Diagnosis not present

## 2019-03-06 DIAGNOSIS — N189 Chronic kidney disease, unspecified: Secondary | ICD-10-CM | POA: Diagnosis not present

## 2019-03-06 DIAGNOSIS — M545 Low back pain: Secondary | ICD-10-CM | POA: Diagnosis not present

## 2019-03-06 DIAGNOSIS — I10 Essential (primary) hypertension: Secondary | ICD-10-CM | POA: Diagnosis not present

## 2019-03-07 DIAGNOSIS — N189 Chronic kidney disease, unspecified: Secondary | ICD-10-CM | POA: Diagnosis not present

## 2019-03-07 DIAGNOSIS — F4310 Post-traumatic stress disorder, unspecified: Secondary | ICD-10-CM | POA: Diagnosis not present

## 2019-03-07 DIAGNOSIS — I10 Essential (primary) hypertension: Secondary | ICD-10-CM | POA: Diagnosis not present

## 2019-03-07 DIAGNOSIS — E1165 Type 2 diabetes mellitus with hyperglycemia: Secondary | ICD-10-CM | POA: Diagnosis not present

## 2019-03-07 DIAGNOSIS — M545 Low back pain: Secondary | ICD-10-CM | POA: Diagnosis not present

## 2019-03-08 DIAGNOSIS — N189 Chronic kidney disease, unspecified: Secondary | ICD-10-CM | POA: Diagnosis not present

## 2019-03-08 DIAGNOSIS — I10 Essential (primary) hypertension: Secondary | ICD-10-CM | POA: Diagnosis not present

## 2019-03-08 DIAGNOSIS — F4310 Post-traumatic stress disorder, unspecified: Secondary | ICD-10-CM | POA: Diagnosis not present

## 2019-03-08 DIAGNOSIS — M545 Low back pain: Secondary | ICD-10-CM | POA: Diagnosis not present

## 2019-03-08 DIAGNOSIS — E1165 Type 2 diabetes mellitus with hyperglycemia: Secondary | ICD-10-CM | POA: Diagnosis not present

## 2019-03-09 DIAGNOSIS — E1165 Type 2 diabetes mellitus with hyperglycemia: Secondary | ICD-10-CM | POA: Diagnosis not present

## 2019-03-09 DIAGNOSIS — F4310 Post-traumatic stress disorder, unspecified: Secondary | ICD-10-CM | POA: Diagnosis not present

## 2019-03-09 DIAGNOSIS — M545 Low back pain: Secondary | ICD-10-CM | POA: Diagnosis not present

## 2019-03-09 DIAGNOSIS — I10 Essential (primary) hypertension: Secondary | ICD-10-CM | POA: Diagnosis not present

## 2019-03-09 DIAGNOSIS — N189 Chronic kidney disease, unspecified: Secondary | ICD-10-CM | POA: Diagnosis not present

## 2019-03-11 DIAGNOSIS — F4310 Post-traumatic stress disorder, unspecified: Secondary | ICD-10-CM | POA: Diagnosis not present

## 2019-03-11 DIAGNOSIS — M545 Low back pain: Secondary | ICD-10-CM | POA: Diagnosis not present

## 2019-03-11 DIAGNOSIS — N189 Chronic kidney disease, unspecified: Secondary | ICD-10-CM | POA: Diagnosis not present

## 2019-03-11 DIAGNOSIS — I10 Essential (primary) hypertension: Secondary | ICD-10-CM | POA: Diagnosis not present

## 2019-03-11 DIAGNOSIS — E1165 Type 2 diabetes mellitus with hyperglycemia: Secondary | ICD-10-CM | POA: Diagnosis not present

## 2019-03-12 DIAGNOSIS — M545 Low back pain: Secondary | ICD-10-CM | POA: Diagnosis not present

## 2019-03-12 DIAGNOSIS — F4310 Post-traumatic stress disorder, unspecified: Secondary | ICD-10-CM | POA: Diagnosis not present

## 2019-03-12 DIAGNOSIS — N189 Chronic kidney disease, unspecified: Secondary | ICD-10-CM | POA: Diagnosis not present

## 2019-03-12 DIAGNOSIS — E1165 Type 2 diabetes mellitus with hyperglycemia: Secondary | ICD-10-CM | POA: Diagnosis not present

## 2019-03-12 DIAGNOSIS — I10 Essential (primary) hypertension: Secondary | ICD-10-CM | POA: Diagnosis not present

## 2019-03-13 DIAGNOSIS — F4310 Post-traumatic stress disorder, unspecified: Secondary | ICD-10-CM | POA: Diagnosis not present

## 2019-03-13 DIAGNOSIS — I10 Essential (primary) hypertension: Secondary | ICD-10-CM | POA: Diagnosis not present

## 2019-03-13 DIAGNOSIS — M545 Low back pain: Secondary | ICD-10-CM | POA: Diagnosis not present

## 2019-03-13 DIAGNOSIS — N189 Chronic kidney disease, unspecified: Secondary | ICD-10-CM | POA: Diagnosis not present

## 2019-03-13 DIAGNOSIS — E1165 Type 2 diabetes mellitus with hyperglycemia: Secondary | ICD-10-CM | POA: Diagnosis not present

## 2019-03-14 DIAGNOSIS — I10 Essential (primary) hypertension: Secondary | ICD-10-CM | POA: Diagnosis not present

## 2019-03-14 DIAGNOSIS — E1165 Type 2 diabetes mellitus with hyperglycemia: Secondary | ICD-10-CM | POA: Diagnosis not present

## 2019-03-14 DIAGNOSIS — F4310 Post-traumatic stress disorder, unspecified: Secondary | ICD-10-CM | POA: Diagnosis not present

## 2019-03-14 DIAGNOSIS — M545 Low back pain: Secondary | ICD-10-CM | POA: Diagnosis not present

## 2019-03-14 DIAGNOSIS — N189 Chronic kidney disease, unspecified: Secondary | ICD-10-CM | POA: Diagnosis not present

## 2019-03-15 DIAGNOSIS — F4310 Post-traumatic stress disorder, unspecified: Secondary | ICD-10-CM | POA: Diagnosis not present

## 2019-03-15 DIAGNOSIS — I10 Essential (primary) hypertension: Secondary | ICD-10-CM | POA: Diagnosis not present

## 2019-03-15 DIAGNOSIS — E1165 Type 2 diabetes mellitus with hyperglycemia: Secondary | ICD-10-CM | POA: Diagnosis not present

## 2019-03-15 DIAGNOSIS — N189 Chronic kidney disease, unspecified: Secondary | ICD-10-CM | POA: Diagnosis not present

## 2019-03-15 DIAGNOSIS — M545 Low back pain: Secondary | ICD-10-CM | POA: Diagnosis not present

## 2019-03-16 DIAGNOSIS — I10 Essential (primary) hypertension: Secondary | ICD-10-CM | POA: Diagnosis not present

## 2019-03-16 DIAGNOSIS — E1165 Type 2 diabetes mellitus with hyperglycemia: Secondary | ICD-10-CM | POA: Diagnosis not present

## 2019-03-16 DIAGNOSIS — N189 Chronic kidney disease, unspecified: Secondary | ICD-10-CM | POA: Diagnosis not present

## 2019-03-16 DIAGNOSIS — F4310 Post-traumatic stress disorder, unspecified: Secondary | ICD-10-CM | POA: Diagnosis not present

## 2019-03-16 DIAGNOSIS — M545 Low back pain: Secondary | ICD-10-CM | POA: Diagnosis not present

## 2019-03-18 DIAGNOSIS — N189 Chronic kidney disease, unspecified: Secondary | ICD-10-CM | POA: Diagnosis not present

## 2019-03-18 DIAGNOSIS — I10 Essential (primary) hypertension: Secondary | ICD-10-CM | POA: Diagnosis not present

## 2019-03-18 DIAGNOSIS — M545 Low back pain: Secondary | ICD-10-CM | POA: Diagnosis not present

## 2019-03-18 DIAGNOSIS — E1165 Type 2 diabetes mellitus with hyperglycemia: Secondary | ICD-10-CM | POA: Diagnosis not present

## 2019-03-18 DIAGNOSIS — F4310 Post-traumatic stress disorder, unspecified: Secondary | ICD-10-CM | POA: Diagnosis not present

## 2019-03-19 DIAGNOSIS — E1165 Type 2 diabetes mellitus with hyperglycemia: Secondary | ICD-10-CM | POA: Diagnosis not present

## 2019-03-19 DIAGNOSIS — F4310 Post-traumatic stress disorder, unspecified: Secondary | ICD-10-CM | POA: Diagnosis not present

## 2019-03-19 DIAGNOSIS — I10 Essential (primary) hypertension: Secondary | ICD-10-CM | POA: Diagnosis not present

## 2019-03-19 DIAGNOSIS — N189 Chronic kidney disease, unspecified: Secondary | ICD-10-CM | POA: Diagnosis not present

## 2019-03-19 DIAGNOSIS — M545 Low back pain: Secondary | ICD-10-CM | POA: Diagnosis not present

## 2019-03-20 DIAGNOSIS — M545 Low back pain: Secondary | ICD-10-CM | POA: Diagnosis not present

## 2019-03-20 DIAGNOSIS — E1165 Type 2 diabetes mellitus with hyperglycemia: Secondary | ICD-10-CM | POA: Diagnosis not present

## 2019-03-20 DIAGNOSIS — I10 Essential (primary) hypertension: Secondary | ICD-10-CM | POA: Diagnosis not present

## 2019-03-20 DIAGNOSIS — F4310 Post-traumatic stress disorder, unspecified: Secondary | ICD-10-CM | POA: Diagnosis not present

## 2019-03-20 DIAGNOSIS — N189 Chronic kidney disease, unspecified: Secondary | ICD-10-CM | POA: Diagnosis not present

## 2019-03-21 DIAGNOSIS — N189 Chronic kidney disease, unspecified: Secondary | ICD-10-CM | POA: Diagnosis not present

## 2019-03-21 DIAGNOSIS — I10 Essential (primary) hypertension: Secondary | ICD-10-CM | POA: Diagnosis not present

## 2019-03-21 DIAGNOSIS — M25571 Pain in right ankle and joints of right foot: Secondary | ICD-10-CM | POA: Diagnosis not present

## 2019-03-21 DIAGNOSIS — F4310 Post-traumatic stress disorder, unspecified: Secondary | ICD-10-CM | POA: Diagnosis not present

## 2019-03-21 DIAGNOSIS — E1165 Type 2 diabetes mellitus with hyperglycemia: Secondary | ICD-10-CM | POA: Diagnosis not present

## 2019-03-21 DIAGNOSIS — M545 Low back pain: Secondary | ICD-10-CM | POA: Diagnosis not present

## 2019-03-21 DIAGNOSIS — M79604 Pain in right leg: Secondary | ICD-10-CM | POA: Diagnosis not present

## 2019-03-21 DIAGNOSIS — M79605 Pain in left leg: Secondary | ICD-10-CM | POA: Diagnosis not present

## 2019-03-22 DIAGNOSIS — F4310 Post-traumatic stress disorder, unspecified: Secondary | ICD-10-CM | POA: Diagnosis not present

## 2019-03-22 DIAGNOSIS — N189 Chronic kidney disease, unspecified: Secondary | ICD-10-CM | POA: Diagnosis not present

## 2019-03-22 DIAGNOSIS — M545 Low back pain: Secondary | ICD-10-CM | POA: Diagnosis not present

## 2019-03-22 DIAGNOSIS — I10 Essential (primary) hypertension: Secondary | ICD-10-CM | POA: Diagnosis not present

## 2019-03-22 DIAGNOSIS — E1165 Type 2 diabetes mellitus with hyperglycemia: Secondary | ICD-10-CM | POA: Diagnosis not present

## 2019-03-23 DIAGNOSIS — I10 Essential (primary) hypertension: Secondary | ICD-10-CM | POA: Diagnosis not present

## 2019-03-23 DIAGNOSIS — M545 Low back pain: Secondary | ICD-10-CM | POA: Diagnosis not present

## 2019-03-23 DIAGNOSIS — E1165 Type 2 diabetes mellitus with hyperglycemia: Secondary | ICD-10-CM | POA: Diagnosis not present

## 2019-03-23 DIAGNOSIS — N189 Chronic kidney disease, unspecified: Secondary | ICD-10-CM | POA: Diagnosis not present

## 2019-03-23 DIAGNOSIS — F4310 Post-traumatic stress disorder, unspecified: Secondary | ICD-10-CM | POA: Diagnosis not present

## 2019-03-25 DIAGNOSIS — N189 Chronic kidney disease, unspecified: Secondary | ICD-10-CM | POA: Diagnosis not present

## 2019-03-25 DIAGNOSIS — I10 Essential (primary) hypertension: Secondary | ICD-10-CM | POA: Diagnosis not present

## 2019-03-25 DIAGNOSIS — F4310 Post-traumatic stress disorder, unspecified: Secondary | ICD-10-CM | POA: Diagnosis not present

## 2019-03-25 DIAGNOSIS — M545 Low back pain: Secondary | ICD-10-CM | POA: Diagnosis not present

## 2019-03-25 DIAGNOSIS — E1165 Type 2 diabetes mellitus with hyperglycemia: Secondary | ICD-10-CM | POA: Diagnosis not present

## 2019-03-26 DIAGNOSIS — F4310 Post-traumatic stress disorder, unspecified: Secondary | ICD-10-CM | POA: Diagnosis not present

## 2019-03-26 DIAGNOSIS — M545 Low back pain: Secondary | ICD-10-CM | POA: Diagnosis not present

## 2019-03-26 DIAGNOSIS — I10 Essential (primary) hypertension: Secondary | ICD-10-CM | POA: Diagnosis not present

## 2019-03-26 DIAGNOSIS — E1165 Type 2 diabetes mellitus with hyperglycemia: Secondary | ICD-10-CM | POA: Diagnosis not present

## 2019-03-26 DIAGNOSIS — N189 Chronic kidney disease, unspecified: Secondary | ICD-10-CM | POA: Diagnosis not present

## 2019-03-27 DIAGNOSIS — I10 Essential (primary) hypertension: Secondary | ICD-10-CM | POA: Diagnosis not present

## 2019-03-27 DIAGNOSIS — E1165 Type 2 diabetes mellitus with hyperglycemia: Secondary | ICD-10-CM | POA: Diagnosis not present

## 2019-03-27 DIAGNOSIS — N189 Chronic kidney disease, unspecified: Secondary | ICD-10-CM | POA: Diagnosis not present

## 2019-03-27 DIAGNOSIS — M545 Low back pain: Secondary | ICD-10-CM | POA: Diagnosis not present

## 2019-03-27 DIAGNOSIS — F4310 Post-traumatic stress disorder, unspecified: Secondary | ICD-10-CM | POA: Diagnosis not present

## 2019-03-28 DIAGNOSIS — F4310 Post-traumatic stress disorder, unspecified: Secondary | ICD-10-CM | POA: Diagnosis not present

## 2019-03-28 DIAGNOSIS — I10 Essential (primary) hypertension: Secondary | ICD-10-CM | POA: Diagnosis not present

## 2019-03-28 DIAGNOSIS — E1165 Type 2 diabetes mellitus with hyperglycemia: Secondary | ICD-10-CM | POA: Diagnosis not present

## 2019-03-28 DIAGNOSIS — N189 Chronic kidney disease, unspecified: Secondary | ICD-10-CM | POA: Diagnosis not present

## 2019-03-28 DIAGNOSIS — M545 Low back pain: Secondary | ICD-10-CM | POA: Diagnosis not present

## 2019-03-29 DIAGNOSIS — F4310 Post-traumatic stress disorder, unspecified: Secondary | ICD-10-CM | POA: Diagnosis not present

## 2019-03-29 DIAGNOSIS — I10 Essential (primary) hypertension: Secondary | ICD-10-CM | POA: Diagnosis not present

## 2019-03-29 DIAGNOSIS — M545 Low back pain: Secondary | ICD-10-CM | POA: Diagnosis not present

## 2019-03-29 DIAGNOSIS — E1165 Type 2 diabetes mellitus with hyperglycemia: Secondary | ICD-10-CM | POA: Diagnosis not present

## 2019-03-29 DIAGNOSIS — N189 Chronic kidney disease, unspecified: Secondary | ICD-10-CM | POA: Diagnosis not present

## 2019-04-01 DIAGNOSIS — F4310 Post-traumatic stress disorder, unspecified: Secondary | ICD-10-CM | POA: Diagnosis not present

## 2019-04-01 DIAGNOSIS — I10 Essential (primary) hypertension: Secondary | ICD-10-CM | POA: Diagnosis not present

## 2019-04-01 DIAGNOSIS — N189 Chronic kidney disease, unspecified: Secondary | ICD-10-CM | POA: Diagnosis not present

## 2019-04-01 DIAGNOSIS — E1165 Type 2 diabetes mellitus with hyperglycemia: Secondary | ICD-10-CM | POA: Diagnosis not present

## 2019-04-01 DIAGNOSIS — M545 Low back pain: Secondary | ICD-10-CM | POA: Diagnosis not present

## 2019-04-02 DIAGNOSIS — I10 Essential (primary) hypertension: Secondary | ICD-10-CM | POA: Diagnosis not present

## 2019-04-02 DIAGNOSIS — N189 Chronic kidney disease, unspecified: Secondary | ICD-10-CM | POA: Diagnosis not present

## 2019-04-02 DIAGNOSIS — E1165 Type 2 diabetes mellitus with hyperglycemia: Secondary | ICD-10-CM | POA: Diagnosis not present

## 2019-04-02 DIAGNOSIS — M545 Low back pain: Secondary | ICD-10-CM | POA: Diagnosis not present

## 2019-04-02 DIAGNOSIS — F4310 Post-traumatic stress disorder, unspecified: Secondary | ICD-10-CM | POA: Diagnosis not present

## 2019-04-03 DIAGNOSIS — N189 Chronic kidney disease, unspecified: Secondary | ICD-10-CM | POA: Diagnosis not present

## 2019-04-03 DIAGNOSIS — E1165 Type 2 diabetes mellitus with hyperglycemia: Secondary | ICD-10-CM | POA: Diagnosis not present

## 2019-04-03 DIAGNOSIS — I10 Essential (primary) hypertension: Secondary | ICD-10-CM | POA: Diagnosis not present

## 2019-04-03 DIAGNOSIS — M545 Low back pain: Secondary | ICD-10-CM | POA: Diagnosis not present

## 2019-04-03 DIAGNOSIS — F4310 Post-traumatic stress disorder, unspecified: Secondary | ICD-10-CM | POA: Diagnosis not present

## 2019-04-04 DIAGNOSIS — F4310 Post-traumatic stress disorder, unspecified: Secondary | ICD-10-CM | POA: Diagnosis not present

## 2019-04-04 DIAGNOSIS — M545 Low back pain: Secondary | ICD-10-CM | POA: Diagnosis not present

## 2019-04-04 DIAGNOSIS — E1165 Type 2 diabetes mellitus with hyperglycemia: Secondary | ICD-10-CM | POA: Diagnosis not present

## 2019-04-04 DIAGNOSIS — I10 Essential (primary) hypertension: Secondary | ICD-10-CM | POA: Diagnosis not present

## 2019-04-04 DIAGNOSIS — N189 Chronic kidney disease, unspecified: Secondary | ICD-10-CM | POA: Diagnosis not present

## 2019-04-05 DIAGNOSIS — I10 Essential (primary) hypertension: Secondary | ICD-10-CM | POA: Diagnosis not present

## 2019-04-05 DIAGNOSIS — M549 Dorsalgia, unspecified: Secondary | ICD-10-CM | POA: Diagnosis not present

## 2019-04-05 DIAGNOSIS — N186 End stage renal disease: Secondary | ICD-10-CM | POA: Diagnosis not present

## 2019-04-05 DIAGNOSIS — N189 Chronic kidney disease, unspecified: Secondary | ICD-10-CM | POA: Diagnosis not present

## 2019-04-05 DIAGNOSIS — F4310 Post-traumatic stress disorder, unspecified: Secondary | ICD-10-CM | POA: Diagnosis not present

## 2019-04-05 DIAGNOSIS — M545 Low back pain: Secondary | ICD-10-CM | POA: Diagnosis not present

## 2019-04-05 DIAGNOSIS — E1165 Type 2 diabetes mellitus with hyperglycemia: Secondary | ICD-10-CM | POA: Diagnosis not present

## 2019-04-06 DIAGNOSIS — F4310 Post-traumatic stress disorder, unspecified: Secondary | ICD-10-CM | POA: Diagnosis not present

## 2019-04-06 DIAGNOSIS — M545 Low back pain: Secondary | ICD-10-CM | POA: Diagnosis not present

## 2019-04-06 DIAGNOSIS — N189 Chronic kidney disease, unspecified: Secondary | ICD-10-CM | POA: Diagnosis not present

## 2019-04-06 DIAGNOSIS — E1165 Type 2 diabetes mellitus with hyperglycemia: Secondary | ICD-10-CM | POA: Diagnosis not present

## 2019-04-06 DIAGNOSIS — I10 Essential (primary) hypertension: Secondary | ICD-10-CM | POA: Diagnosis not present

## 2019-04-08 DIAGNOSIS — Z01818 Encounter for other preprocedural examination: Secondary | ICD-10-CM | POA: Diagnosis not present

## 2019-04-08 DIAGNOSIS — N186 End stage renal disease: Secondary | ICD-10-CM | POA: Diagnosis not present

## 2019-04-08 DIAGNOSIS — Z7289 Other problems related to lifestyle: Secondary | ICD-10-CM | POA: Diagnosis not present

## 2019-04-15 DIAGNOSIS — M545 Low back pain: Secondary | ICD-10-CM | POA: Diagnosis not present

## 2019-04-15 DIAGNOSIS — I10 Essential (primary) hypertension: Secondary | ICD-10-CM | POA: Diagnosis not present

## 2019-04-15 DIAGNOSIS — E1165 Type 2 diabetes mellitus with hyperglycemia: Secondary | ICD-10-CM | POA: Diagnosis not present

## 2019-04-15 DIAGNOSIS — N185 Chronic kidney disease, stage 5: Secondary | ICD-10-CM | POA: Diagnosis not present

## 2019-04-15 DIAGNOSIS — N2581 Secondary hyperparathyroidism of renal origin: Secondary | ICD-10-CM | POA: Diagnosis not present

## 2019-04-15 DIAGNOSIS — F4310 Post-traumatic stress disorder, unspecified: Secondary | ICD-10-CM | POA: Diagnosis not present

## 2019-04-15 DIAGNOSIS — N189 Chronic kidney disease, unspecified: Secondary | ICD-10-CM | POA: Diagnosis not present

## 2019-04-16 DIAGNOSIS — F4310 Post-traumatic stress disorder, unspecified: Secondary | ICD-10-CM | POA: Diagnosis not present

## 2019-04-16 DIAGNOSIS — I10 Essential (primary) hypertension: Secondary | ICD-10-CM | POA: Diagnosis not present

## 2019-04-16 DIAGNOSIS — M545 Low back pain: Secondary | ICD-10-CM | POA: Diagnosis not present

## 2019-04-16 DIAGNOSIS — E1165 Type 2 diabetes mellitus with hyperglycemia: Secondary | ICD-10-CM | POA: Diagnosis not present

## 2019-04-16 DIAGNOSIS — N189 Chronic kidney disease, unspecified: Secondary | ICD-10-CM | POA: Diagnosis not present

## 2019-04-17 DIAGNOSIS — F4310 Post-traumatic stress disorder, unspecified: Secondary | ICD-10-CM | POA: Diagnosis not present

## 2019-04-17 DIAGNOSIS — E1165 Type 2 diabetes mellitus with hyperglycemia: Secondary | ICD-10-CM | POA: Diagnosis not present

## 2019-04-17 DIAGNOSIS — N189 Chronic kidney disease, unspecified: Secondary | ICD-10-CM | POA: Diagnosis not present

## 2019-04-17 DIAGNOSIS — M545 Low back pain: Secondary | ICD-10-CM | POA: Diagnosis not present

## 2019-04-17 DIAGNOSIS — I10 Essential (primary) hypertension: Secondary | ICD-10-CM | POA: Diagnosis not present

## 2019-04-18 ENCOUNTER — Ambulatory Visit: Payer: Medicaid Other | Admitting: Cardiology

## 2019-04-18 DIAGNOSIS — F4310 Post-traumatic stress disorder, unspecified: Secondary | ICD-10-CM | POA: Diagnosis not present

## 2019-04-18 DIAGNOSIS — I10 Essential (primary) hypertension: Secondary | ICD-10-CM | POA: Diagnosis not present

## 2019-04-18 DIAGNOSIS — N189 Chronic kidney disease, unspecified: Secondary | ICD-10-CM | POA: Diagnosis not present

## 2019-04-18 DIAGNOSIS — M545 Low back pain: Secondary | ICD-10-CM | POA: Diagnosis not present

## 2019-04-18 DIAGNOSIS — E1165 Type 2 diabetes mellitus with hyperglycemia: Secondary | ICD-10-CM | POA: Diagnosis not present

## 2019-04-19 DIAGNOSIS — N189 Chronic kidney disease, unspecified: Secondary | ICD-10-CM | POA: Diagnosis not present

## 2019-04-19 DIAGNOSIS — M545 Low back pain: Secondary | ICD-10-CM | POA: Diagnosis not present

## 2019-04-19 DIAGNOSIS — F4310 Post-traumatic stress disorder, unspecified: Secondary | ICD-10-CM | POA: Diagnosis not present

## 2019-04-19 DIAGNOSIS — I10 Essential (primary) hypertension: Secondary | ICD-10-CM | POA: Diagnosis not present

## 2019-04-19 DIAGNOSIS — E1165 Type 2 diabetes mellitus with hyperglycemia: Secondary | ICD-10-CM | POA: Diagnosis not present

## 2019-04-20 DIAGNOSIS — N189 Chronic kidney disease, unspecified: Secondary | ICD-10-CM | POA: Diagnosis not present

## 2019-04-20 DIAGNOSIS — E1165 Type 2 diabetes mellitus with hyperglycemia: Secondary | ICD-10-CM | POA: Diagnosis not present

## 2019-04-20 DIAGNOSIS — I10 Essential (primary) hypertension: Secondary | ICD-10-CM | POA: Diagnosis not present

## 2019-04-20 DIAGNOSIS — M545 Low back pain: Secondary | ICD-10-CM | POA: Diagnosis not present

## 2019-04-20 DIAGNOSIS — F4310 Post-traumatic stress disorder, unspecified: Secondary | ICD-10-CM | POA: Diagnosis not present

## 2019-04-22 DIAGNOSIS — I10 Essential (primary) hypertension: Secondary | ICD-10-CM | POA: Diagnosis not present

## 2019-04-22 DIAGNOSIS — F4310 Post-traumatic stress disorder, unspecified: Secondary | ICD-10-CM | POA: Diagnosis not present

## 2019-04-22 DIAGNOSIS — M545 Low back pain: Secondary | ICD-10-CM | POA: Diagnosis not present

## 2019-04-22 DIAGNOSIS — N189 Chronic kidney disease, unspecified: Secondary | ICD-10-CM | POA: Diagnosis not present

## 2019-04-22 DIAGNOSIS — E1165 Type 2 diabetes mellitus with hyperglycemia: Secondary | ICD-10-CM | POA: Diagnosis not present

## 2019-04-23 DIAGNOSIS — E1165 Type 2 diabetes mellitus with hyperglycemia: Secondary | ICD-10-CM | POA: Diagnosis not present

## 2019-04-23 DIAGNOSIS — N189 Chronic kidney disease, unspecified: Secondary | ICD-10-CM | POA: Diagnosis not present

## 2019-04-23 DIAGNOSIS — M545 Low back pain: Secondary | ICD-10-CM | POA: Diagnosis not present

## 2019-04-23 DIAGNOSIS — F4310 Post-traumatic stress disorder, unspecified: Secondary | ICD-10-CM | POA: Diagnosis not present

## 2019-04-23 DIAGNOSIS — I10 Essential (primary) hypertension: Secondary | ICD-10-CM | POA: Diagnosis not present

## 2019-04-24 DIAGNOSIS — M545 Low back pain: Secondary | ICD-10-CM | POA: Diagnosis not present

## 2019-04-24 DIAGNOSIS — I10 Essential (primary) hypertension: Secondary | ICD-10-CM | POA: Diagnosis not present

## 2019-04-24 DIAGNOSIS — E1165 Type 2 diabetes mellitus with hyperglycemia: Secondary | ICD-10-CM | POA: Diagnosis not present

## 2019-04-24 DIAGNOSIS — F4310 Post-traumatic stress disorder, unspecified: Secondary | ICD-10-CM | POA: Diagnosis not present

## 2019-04-24 DIAGNOSIS — N189 Chronic kidney disease, unspecified: Secondary | ICD-10-CM | POA: Diagnosis not present

## 2019-04-25 DIAGNOSIS — H401122 Primary open-angle glaucoma, left eye, moderate stage: Secondary | ICD-10-CM | POA: Diagnosis not present

## 2019-04-25 DIAGNOSIS — N189 Chronic kidney disease, unspecified: Secondary | ICD-10-CM | POA: Diagnosis not present

## 2019-04-25 DIAGNOSIS — F4310 Post-traumatic stress disorder, unspecified: Secondary | ICD-10-CM | POA: Diagnosis not present

## 2019-04-25 DIAGNOSIS — M545 Low back pain: Secondary | ICD-10-CM | POA: Diagnosis not present

## 2019-04-25 DIAGNOSIS — E113593 Type 2 diabetes mellitus with proliferative diabetic retinopathy without macular edema, bilateral: Secondary | ICD-10-CM | POA: Diagnosis not present

## 2019-04-25 DIAGNOSIS — E1165 Type 2 diabetes mellitus with hyperglycemia: Secondary | ICD-10-CM | POA: Diagnosis not present

## 2019-04-25 DIAGNOSIS — I10 Essential (primary) hypertension: Secondary | ICD-10-CM | POA: Diagnosis not present

## 2019-04-26 DIAGNOSIS — I10 Essential (primary) hypertension: Secondary | ICD-10-CM | POA: Diagnosis not present

## 2019-04-26 DIAGNOSIS — M545 Low back pain: Secondary | ICD-10-CM | POA: Diagnosis not present

## 2019-04-26 DIAGNOSIS — N189 Chronic kidney disease, unspecified: Secondary | ICD-10-CM | POA: Diagnosis not present

## 2019-04-26 DIAGNOSIS — F4310 Post-traumatic stress disorder, unspecified: Secondary | ICD-10-CM | POA: Diagnosis not present

## 2019-04-26 DIAGNOSIS — E1165 Type 2 diabetes mellitus with hyperglycemia: Secondary | ICD-10-CM | POA: Diagnosis not present

## 2019-04-27 DIAGNOSIS — E1165 Type 2 diabetes mellitus with hyperglycemia: Secondary | ICD-10-CM | POA: Diagnosis not present

## 2019-04-27 DIAGNOSIS — N189 Chronic kidney disease, unspecified: Secondary | ICD-10-CM | POA: Diagnosis not present

## 2019-04-27 DIAGNOSIS — M545 Low back pain: Secondary | ICD-10-CM | POA: Diagnosis not present

## 2019-04-27 DIAGNOSIS — F4310 Post-traumatic stress disorder, unspecified: Secondary | ICD-10-CM | POA: Diagnosis not present

## 2019-04-27 DIAGNOSIS — I10 Essential (primary) hypertension: Secondary | ICD-10-CM | POA: Diagnosis not present

## 2019-04-29 DIAGNOSIS — N189 Chronic kidney disease, unspecified: Secondary | ICD-10-CM | POA: Diagnosis not present

## 2019-04-29 DIAGNOSIS — M545 Low back pain: Secondary | ICD-10-CM | POA: Diagnosis not present

## 2019-04-29 DIAGNOSIS — I10 Essential (primary) hypertension: Secondary | ICD-10-CM | POA: Diagnosis not present

## 2019-04-29 DIAGNOSIS — F4310 Post-traumatic stress disorder, unspecified: Secondary | ICD-10-CM | POA: Diagnosis not present

## 2019-04-29 DIAGNOSIS — E1165 Type 2 diabetes mellitus with hyperglycemia: Secondary | ICD-10-CM | POA: Diagnosis not present

## 2019-04-30 DIAGNOSIS — I779 Disorder of arteries and arterioles, unspecified: Secondary | ICD-10-CM | POA: Diagnosis not present

## 2019-04-30 DIAGNOSIS — F4310 Post-traumatic stress disorder, unspecified: Secondary | ICD-10-CM | POA: Diagnosis not present

## 2019-04-30 DIAGNOSIS — M545 Low back pain: Secondary | ICD-10-CM | POA: Diagnosis not present

## 2019-04-30 DIAGNOSIS — M25571 Pain in right ankle and joints of right foot: Secondary | ICD-10-CM | POA: Diagnosis not present

## 2019-04-30 DIAGNOSIS — L97509 Non-pressure chronic ulcer of other part of unspecified foot with unspecified severity: Secondary | ICD-10-CM | POA: Diagnosis not present

## 2019-04-30 DIAGNOSIS — E1165 Type 2 diabetes mellitus with hyperglycemia: Secondary | ICD-10-CM | POA: Diagnosis not present

## 2019-04-30 DIAGNOSIS — E11621 Type 2 diabetes mellitus with foot ulcer: Secondary | ICD-10-CM | POA: Diagnosis not present

## 2019-04-30 DIAGNOSIS — I10 Essential (primary) hypertension: Secondary | ICD-10-CM | POA: Diagnosis not present

## 2019-04-30 DIAGNOSIS — M79604 Pain in right leg: Secondary | ICD-10-CM | POA: Diagnosis not present

## 2019-04-30 DIAGNOSIS — N189 Chronic kidney disease, unspecified: Secondary | ICD-10-CM | POA: Diagnosis not present

## 2019-04-30 DIAGNOSIS — M79605 Pain in left leg: Secondary | ICD-10-CM | POA: Diagnosis not present

## 2019-05-01 DIAGNOSIS — F4310 Post-traumatic stress disorder, unspecified: Secondary | ICD-10-CM | POA: Diagnosis not present

## 2019-05-01 DIAGNOSIS — H5213 Myopia, bilateral: Secondary | ICD-10-CM | POA: Diagnosis not present

## 2019-05-01 DIAGNOSIS — M545 Low back pain: Secondary | ICD-10-CM | POA: Diagnosis not present

## 2019-05-01 DIAGNOSIS — E1165 Type 2 diabetes mellitus with hyperglycemia: Secondary | ICD-10-CM | POA: Diagnosis not present

## 2019-05-01 DIAGNOSIS — N189 Chronic kidney disease, unspecified: Secondary | ICD-10-CM | POA: Diagnosis not present

## 2019-05-01 DIAGNOSIS — I10 Essential (primary) hypertension: Secondary | ICD-10-CM | POA: Diagnosis not present

## 2019-05-02 DIAGNOSIS — I10 Essential (primary) hypertension: Secondary | ICD-10-CM | POA: Diagnosis not present

## 2019-05-02 DIAGNOSIS — F4310 Post-traumatic stress disorder, unspecified: Secondary | ICD-10-CM | POA: Diagnosis not present

## 2019-05-02 DIAGNOSIS — E1165 Type 2 diabetes mellitus with hyperglycemia: Secondary | ICD-10-CM | POA: Diagnosis not present

## 2019-05-02 DIAGNOSIS — N189 Chronic kidney disease, unspecified: Secondary | ICD-10-CM | POA: Diagnosis not present

## 2019-05-02 DIAGNOSIS — M545 Low back pain: Secondary | ICD-10-CM | POA: Diagnosis not present

## 2019-05-03 DIAGNOSIS — I10 Essential (primary) hypertension: Secondary | ICD-10-CM | POA: Diagnosis not present

## 2019-05-03 DIAGNOSIS — E1165 Type 2 diabetes mellitus with hyperglycemia: Secondary | ICD-10-CM | POA: Diagnosis not present

## 2019-05-03 DIAGNOSIS — F4310 Post-traumatic stress disorder, unspecified: Secondary | ICD-10-CM | POA: Diagnosis not present

## 2019-05-03 DIAGNOSIS — N189 Chronic kidney disease, unspecified: Secondary | ICD-10-CM | POA: Diagnosis not present

## 2019-05-03 DIAGNOSIS — M545 Low back pain: Secondary | ICD-10-CM | POA: Diagnosis not present

## 2019-05-04 DIAGNOSIS — I10 Essential (primary) hypertension: Secondary | ICD-10-CM | POA: Diagnosis not present

## 2019-05-04 DIAGNOSIS — N189 Chronic kidney disease, unspecified: Secondary | ICD-10-CM | POA: Diagnosis not present

## 2019-05-04 DIAGNOSIS — F4310 Post-traumatic stress disorder, unspecified: Secondary | ICD-10-CM | POA: Diagnosis not present

## 2019-05-04 DIAGNOSIS — M545 Low back pain: Secondary | ICD-10-CM | POA: Diagnosis not present

## 2019-05-04 DIAGNOSIS — E1165 Type 2 diabetes mellitus with hyperglycemia: Secondary | ICD-10-CM | POA: Diagnosis not present

## 2019-05-06 DIAGNOSIS — E1165 Type 2 diabetes mellitus with hyperglycemia: Secondary | ICD-10-CM | POA: Diagnosis not present

## 2019-05-06 DIAGNOSIS — M545 Low back pain: Secondary | ICD-10-CM | POA: Diagnosis not present

## 2019-05-06 DIAGNOSIS — F4310 Post-traumatic stress disorder, unspecified: Secondary | ICD-10-CM | POA: Diagnosis not present

## 2019-05-06 DIAGNOSIS — I10 Essential (primary) hypertension: Secondary | ICD-10-CM | POA: Diagnosis not present

## 2019-05-06 DIAGNOSIS — N189 Chronic kidney disease, unspecified: Secondary | ICD-10-CM | POA: Diagnosis not present

## 2019-05-07 DIAGNOSIS — I10 Essential (primary) hypertension: Secondary | ICD-10-CM | POA: Diagnosis not present

## 2019-05-07 DIAGNOSIS — M545 Low back pain: Secondary | ICD-10-CM | POA: Diagnosis not present

## 2019-05-07 DIAGNOSIS — E1165 Type 2 diabetes mellitus with hyperglycemia: Secondary | ICD-10-CM | POA: Diagnosis not present

## 2019-05-07 DIAGNOSIS — F4310 Post-traumatic stress disorder, unspecified: Secondary | ICD-10-CM | POA: Diagnosis not present

## 2019-05-07 DIAGNOSIS — N189 Chronic kidney disease, unspecified: Secondary | ICD-10-CM | POA: Diagnosis not present

## 2019-05-08 DIAGNOSIS — F4310 Post-traumatic stress disorder, unspecified: Secondary | ICD-10-CM | POA: Diagnosis not present

## 2019-05-08 DIAGNOSIS — N189 Chronic kidney disease, unspecified: Secondary | ICD-10-CM | POA: Diagnosis not present

## 2019-05-08 DIAGNOSIS — I10 Essential (primary) hypertension: Secondary | ICD-10-CM | POA: Diagnosis not present

## 2019-05-08 DIAGNOSIS — E1165 Type 2 diabetes mellitus with hyperglycemia: Secondary | ICD-10-CM | POA: Diagnosis not present

## 2019-05-08 DIAGNOSIS — M545 Low back pain: Secondary | ICD-10-CM | POA: Diagnosis not present

## 2019-05-09 DIAGNOSIS — N189 Chronic kidney disease, unspecified: Secondary | ICD-10-CM | POA: Diagnosis not present

## 2019-05-09 DIAGNOSIS — M545 Low back pain: Secondary | ICD-10-CM | POA: Diagnosis not present

## 2019-05-09 DIAGNOSIS — I10 Essential (primary) hypertension: Secondary | ICD-10-CM | POA: Diagnosis not present

## 2019-05-09 DIAGNOSIS — F4310 Post-traumatic stress disorder, unspecified: Secondary | ICD-10-CM | POA: Diagnosis not present

## 2019-05-09 DIAGNOSIS — E1165 Type 2 diabetes mellitus with hyperglycemia: Secondary | ICD-10-CM | POA: Diagnosis not present

## 2019-05-10 DIAGNOSIS — N189 Chronic kidney disease, unspecified: Secondary | ICD-10-CM | POA: Diagnosis not present

## 2019-05-10 DIAGNOSIS — M545 Low back pain: Secondary | ICD-10-CM | POA: Diagnosis not present

## 2019-05-10 DIAGNOSIS — F4310 Post-traumatic stress disorder, unspecified: Secondary | ICD-10-CM | POA: Diagnosis not present

## 2019-05-10 DIAGNOSIS — E1165 Type 2 diabetes mellitus with hyperglycemia: Secondary | ICD-10-CM | POA: Diagnosis not present

## 2019-05-10 DIAGNOSIS — I10 Essential (primary) hypertension: Secondary | ICD-10-CM | POA: Diagnosis not present

## 2019-05-11 DIAGNOSIS — F4310 Post-traumatic stress disorder, unspecified: Secondary | ICD-10-CM | POA: Diagnosis not present

## 2019-05-11 DIAGNOSIS — N189 Chronic kidney disease, unspecified: Secondary | ICD-10-CM | POA: Diagnosis not present

## 2019-05-11 DIAGNOSIS — E1165 Type 2 diabetes mellitus with hyperglycemia: Secondary | ICD-10-CM | POA: Diagnosis not present

## 2019-05-11 DIAGNOSIS — M545 Low back pain: Secondary | ICD-10-CM | POA: Diagnosis not present

## 2019-05-11 DIAGNOSIS — I10 Essential (primary) hypertension: Secondary | ICD-10-CM | POA: Diagnosis not present

## 2019-05-13 DIAGNOSIS — E1165 Type 2 diabetes mellitus with hyperglycemia: Secondary | ICD-10-CM | POA: Diagnosis not present

## 2019-05-13 DIAGNOSIS — M545 Low back pain: Secondary | ICD-10-CM | POA: Diagnosis not present

## 2019-05-13 DIAGNOSIS — F4310 Post-traumatic stress disorder, unspecified: Secondary | ICD-10-CM | POA: Diagnosis not present

## 2019-05-13 DIAGNOSIS — N189 Chronic kidney disease, unspecified: Secondary | ICD-10-CM | POA: Diagnosis not present

## 2019-05-13 DIAGNOSIS — I10 Essential (primary) hypertension: Secondary | ICD-10-CM | POA: Diagnosis not present

## 2019-05-14 DIAGNOSIS — N189 Chronic kidney disease, unspecified: Secondary | ICD-10-CM | POA: Diagnosis not present

## 2019-05-14 DIAGNOSIS — I10 Essential (primary) hypertension: Secondary | ICD-10-CM | POA: Diagnosis not present

## 2019-05-14 DIAGNOSIS — E1165 Type 2 diabetes mellitus with hyperglycemia: Secondary | ICD-10-CM | POA: Diagnosis not present

## 2019-05-14 DIAGNOSIS — M545 Low back pain: Secondary | ICD-10-CM | POA: Diagnosis not present

## 2019-05-14 DIAGNOSIS — F4310 Post-traumatic stress disorder, unspecified: Secondary | ICD-10-CM | POA: Diagnosis not present

## 2019-05-15 DIAGNOSIS — F4310 Post-traumatic stress disorder, unspecified: Secondary | ICD-10-CM | POA: Diagnosis not present

## 2019-05-15 DIAGNOSIS — M545 Low back pain: Secondary | ICD-10-CM | POA: Diagnosis not present

## 2019-05-15 DIAGNOSIS — N189 Chronic kidney disease, unspecified: Secondary | ICD-10-CM | POA: Diagnosis not present

## 2019-05-15 DIAGNOSIS — I10 Essential (primary) hypertension: Secondary | ICD-10-CM | POA: Diagnosis not present

## 2019-05-15 DIAGNOSIS — E1165 Type 2 diabetes mellitus with hyperglycemia: Secondary | ICD-10-CM | POA: Diagnosis not present

## 2019-05-16 DIAGNOSIS — I10 Essential (primary) hypertension: Secondary | ICD-10-CM | POA: Diagnosis not present

## 2019-05-16 DIAGNOSIS — N189 Chronic kidney disease, unspecified: Secondary | ICD-10-CM | POA: Diagnosis not present

## 2019-05-16 DIAGNOSIS — F4310 Post-traumatic stress disorder, unspecified: Secondary | ICD-10-CM | POA: Diagnosis not present

## 2019-05-16 DIAGNOSIS — E1165 Type 2 diabetes mellitus with hyperglycemia: Secondary | ICD-10-CM | POA: Diagnosis not present

## 2019-05-16 DIAGNOSIS — M545 Low back pain: Secondary | ICD-10-CM | POA: Diagnosis not present

## 2019-05-17 DIAGNOSIS — M545 Low back pain: Secondary | ICD-10-CM | POA: Diagnosis not present

## 2019-05-17 DIAGNOSIS — N189 Chronic kidney disease, unspecified: Secondary | ICD-10-CM | POA: Diagnosis not present

## 2019-05-17 DIAGNOSIS — E1165 Type 2 diabetes mellitus with hyperglycemia: Secondary | ICD-10-CM | POA: Diagnosis not present

## 2019-05-17 DIAGNOSIS — F4310 Post-traumatic stress disorder, unspecified: Secondary | ICD-10-CM | POA: Diagnosis not present

## 2019-05-17 DIAGNOSIS — I10 Essential (primary) hypertension: Secondary | ICD-10-CM | POA: Diagnosis not present

## 2019-05-18 DIAGNOSIS — E1165 Type 2 diabetes mellitus with hyperglycemia: Secondary | ICD-10-CM | POA: Diagnosis not present

## 2019-05-18 DIAGNOSIS — N189 Chronic kidney disease, unspecified: Secondary | ICD-10-CM | POA: Diagnosis not present

## 2019-05-18 DIAGNOSIS — F4310 Post-traumatic stress disorder, unspecified: Secondary | ICD-10-CM | POA: Diagnosis not present

## 2019-05-18 DIAGNOSIS — M545 Low back pain: Secondary | ICD-10-CM | POA: Diagnosis not present

## 2019-05-18 DIAGNOSIS — I10 Essential (primary) hypertension: Secondary | ICD-10-CM | POA: Diagnosis not present

## 2019-05-20 ENCOUNTER — Other Ambulatory Visit: Payer: Self-pay

## 2019-05-20 ENCOUNTER — Encounter: Payer: Self-pay | Admitting: Cardiology

## 2019-05-20 ENCOUNTER — Ambulatory Visit: Payer: Medicaid Other | Admitting: Cardiology

## 2019-05-20 VITALS — BP 138/74 | HR 69 | Temp 96.1°F | Ht 73.0 in | Wt 199.6 lb

## 2019-05-20 DIAGNOSIS — N184 Chronic kidney disease, stage 4 (severe): Secondary | ICD-10-CM | POA: Diagnosis not present

## 2019-05-20 DIAGNOSIS — N185 Chronic kidney disease, stage 5: Secondary | ICD-10-CM | POA: Diagnosis not present

## 2019-05-20 DIAGNOSIS — F4310 Post-traumatic stress disorder, unspecified: Secondary | ICD-10-CM | POA: Diagnosis not present

## 2019-05-20 DIAGNOSIS — I5030 Unspecified diastolic (congestive) heart failure: Secondary | ICD-10-CM | POA: Diagnosis not present

## 2019-05-20 DIAGNOSIS — I10 Essential (primary) hypertension: Secondary | ICD-10-CM

## 2019-05-20 DIAGNOSIS — Z01810 Encounter for preprocedural cardiovascular examination: Secondary | ICD-10-CM

## 2019-05-20 DIAGNOSIS — I132 Hypertensive heart and chronic kidney disease with heart failure and with stage 5 chronic kidney disease, or end stage renal disease: Secondary | ICD-10-CM

## 2019-05-20 DIAGNOSIS — M545 Low back pain: Secondary | ICD-10-CM | POA: Diagnosis not present

## 2019-05-20 DIAGNOSIS — N189 Chronic kidney disease, unspecified: Secondary | ICD-10-CM | POA: Diagnosis not present

## 2019-05-20 DIAGNOSIS — E1165 Type 2 diabetes mellitus with hyperglycemia: Secondary | ICD-10-CM | POA: Diagnosis not present

## 2019-05-20 NOTE — Progress Notes (Signed)
Primary Care Provider: Kerin Perna, NP Cardiologist: Glenetta Hew, MD Transplant Nephrologist: Dr. Reatha [facility]  Clinic Note: Chief Complaint  Patient presents with  . Follow-up    2.5 years; reestablish care  . Congestive Heart Failure    History of chronic HFpEF   HPI:    Cody Webster is a 54 y.o. 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  Cody Webster is currently undergoing kidney pretransplant evaluation at [facility].  Cody Webster was last seen on year in June 2018 for preop evaluation for fistula placement having been given a diagnosis of" heart failure ".  At that time he was prehemodialysis CKD-5.  Echo during an episode of hypertensive urgency and volume overload in February 2016 showed given percent. --> He was seen for hospital follow-up back in March 2019 after a Myoview that have been read as intermediate to high risk, -> on review, this was felt to be relatively low risk.  Is actually been seen by [facility] 6 months later (Dr. Jilda Panda) who agreed with this plan.  Patient had not had any recurrent symptoms.  Was on aspirin beta-blocker.  Held off on statin because of LFT elevation. --> He still has not yet started dialysis.  Recent Hospitalizations: None  Reviewed  CV studies:    The following studies were reviewed today: (if available, images/films reviewed: From Epic Chart or Care Everywhere) . TTE March 2019: EF 60%.,  Moderate LVH.  GR 1 DD.  No R WMA.  Mild RV and moderate RA dilation. . Myoview March 2019: Moderate sized-moderate severity reversible defect in the inferobasal wall. -->  On further review by cardiology, this was clearly not intermediate to high risk study with very small focal area of partial reversibility. o Plan was to treat medically based on high risk of contrast-induced nephropathy leading to  permanent dialysis.  Interval History:   Cody Webster returns here today to reestablish cardiology care.  He is accompanied by a caregiver from [facility] facility.  She is helping out with Cody paperwork because he has significant visual problems from diabetic retinopathy.  He has trouble seeing especially of Cody right eye.  Cody Webster recently moved back to [place] from the [place] area, but still continues to follow-up with [facility] for transplant evaluation.  Since he was last seen in 2019, he has not had any signs or symptoms to suggest ischemic CAD.  No chest pain or pressure with rest or exertion.  He does not do a lot of walking because of diabetic feet with significant neuropathy and ulcers.  He also has very poor balance as a result of neuropathy.  As such he is quite deconditioned.  To the extent that he is able to walk, he has not had any chest pain or pressure with rest or exertion.  He probably does not walk enough to note any claudication although he likely would have.  He denies any PND, orthopnea or edema.  Maybe a few rare palpitations but no rapid irregular heartbeats.  No syncope/near syncope, but does have some positional dizziness.  CV Review of Symptoms (Summary): no chest pain or dyspnea on exertion positive for - Probable claudication from diabetic peripheral vascular disease. negative for - edema, irregular heartbeat, orthopnea, palpitations, paroxysmal nocturnal dyspnea, rapid heart rate, shortness of breath or Syncope/near syncope (but does have some positional dizziness).  No  TIA or amaurosis fugax.  The patient does not have symptoms concerning for COVID-19 infection (fever, chills, cough, or new shortness of breath).  The patient is practicing social distancing. ++ Masking.    REVIEWED OF SYSTEMS   A comprehensive ROS was performed. Review of Systems  Constitutional: Negative for malaise/fatigue (Just does not have much energy or drive to do things.  Quite  deconditioned.) and weight loss.  HENT: Negative for congestion and nosebleeds.   Eyes:       Significant vision loss-left eye worse than right  Respiratory: Negative for cough, shortness of breath and wheezing.   Gastrointestinal: Negative for abdominal pain, blood in stool and melena.  Genitourinary: Negative for hematuria.  Musculoskeletal: Positive for falls (He has had some borderline falls, no true falls.). Negative for joint pain.       Significant foot pain from ulcers and peripheral neuropathy.  Neurological: Positive for tingling (Feet) and weakness (Legs are quite weak and he has very poor balance).  Psychiatric/Behavioral: Negative for memory loss. The patient is not nervous/anxious and does not have insomnia.   All other systems reviewed and are negative.    I have reviewed and (if needed) personally updated the patient's problem list, medications, allergies, past medical and surgical history, social and family history.   PAST MEDICAL HISTORY   Past Medical History:  Diagnosis Date  . Carotid artery occlusion   . 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 Diabetes mellitus without complication ([place])    type 2  . GERD (gastroesophageal reflux disease)   . Hypertension   . 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 Peripheral neuropathy   . Pneumonia   . Seizures ([place])    27 years ago     PAST SURGICAL HISTORY   Past Surgical History:  Procedure Laterality Date  . AV FISTULA PLACEMENT Left 11/08/2016   Procedure: ARTERIOVENOUS (AV) FISTULA CREATION;  Surgeon: Angelia Mould, MD;  Location: [facility];  Service: Vascular;  Laterality: Left;  . NM MYOVIEW LTD  07/2016   "Moderate sized moderate severity" (on cardiology was very small size, small intensity) partially reversible inferoapical//inferoseptal defect.  (Read as  intermediate-high risk) -> over read by cardiology as LOW RISK  . right foot surgery    . TOE AMPUTATION    . TRANSTHORACIC ECHOCARDIOGRAM  07/2016   EF 60%.,  Moderate LVH.  GR 1 DD.  No R WMA.  Mild RV and moderate RA dilation.     MEDICATIONS/ALLERGIES   Current Meds  Medication Sig  . acetaminophen (TYLENOL) 500 MG tablet Take 1,000 mg by mouth every 6 (six) hours as needed (for pain/headaches.).  Marland Kitchen amitriptyline (ELAVIL) 75 MG tablet Take 75 mg by mouth at bedtime.  Marland Kitchen aspirin EC 81 MG tablet Take 81 mg by mouth daily.  Marland Kitchen atorvastatin (LIPITOR) 80 MG tablet Take 1 tablet (80 mg total) by mouth daily at 6 PM.  . calcitRIOL (ROCALTROL) 0.25 MCG capsule Take 0.25 mcg by mouth daily.  . carvedilol (COREG) 25 MG tablet Take 1 tablet (25 mg total) by mouth 2 (two) times daily with a meal. Reported on 05/27/2015  . cetirizine (ZYRTEC) 10 MG tablet Take 10 mg by mouth daily.  . cloNIDine (CATAPRES) 0.1 MG tablet Take 0.1 mg by mouth 3 (three) times daily.  Marland Kitchen gabapentin (NEURONTIN)  300 MG capsule TAKE 1 CAPSULE (300 MG TOTAL) BY MOUTH 2 (TWO) TIMES DAILY.  . hydrALAZINE (APRESOLINE) 50 MG tablet TAKE 2 TABLETS (100 MG TOTAL) BY MOUTH 3 (THREE) TIMES DAILY.  . nitroGLYCERIN (NITROSTAT) 0.4 MG SL tablet Place 1 tablet (0.4 mg total) under the tongue every 5 (five) minutes as needed for chest pain (CP or SOB).  Marland Kitchen oxyCODONE-acetaminophen (PERCOCET) 10-325 MG tablet Take 1 tablet by mouth every 6 (six) hours as needed. For pain.  . sitaGLIPtin (JANUVIA) 25 MG tablet Take 1 tablet (25 mg total) by mouth daily.  . traZODone (DESYREL) 50 MG tablet Take 50 mg by mouth at bedtime.    Allergies  Allergen Reactions  . Influenza Vaccines     Chest congestion, fever  . Morphine And Related Shortness Of Breath and Other (See Comments)    Hallucination  Tolerates Norco/Vicodin  . Pneumococcal Vaccines Nausea And Vomiting    FEVER     SOCIAL HISTORY/FAMILY HISTORY   Social History   Tobacco Use    . Smoking status: Current Every Day Smoker    Packs/day: 1.00    Years: 30.00    Pack years: 30.00    Types: Cigarettes, Cigars  . Smokeless tobacco: Former User    Types: Snuff, Sarina Ser    Quit date: 12/11/1983  Substance Use Topics  . Alcohol use: Yes    Comment: ocassaionlly   . Drug use: No   Social History   Social History Narrative  . Not on file    Family History family history includes CAD in Cody Webster and Webster; Diabetes type II in Cody brother and father; Hypertension in Cody Webster.   OBJCTIVE -PE, EKG, labs   Wt Readings from Last 3 Encounters:  05/20/19 199 lb 9.6 oz (90.5 kg)  01/03/18 190 lb (86.2 kg)  12/20/17 190 lb (86.2 kg)    Physical Exam: BP 138/74   Pulse 69   Temp (!) 96.1 F (35.6 C)   Ht 6\' 1"  (1.854 m)   Wt 199 lb 9.6 oz (90.5 kg)   SpO2 100%   BMI 26.33 kg/m  Physical Exam  Constitutional: He is oriented to person, place, and time. He appears well-developed and well-nourished. No distress.  Somewhat chronically ill-appearing gentleman.  Well-groomed.  HENT:  Head: Normocephalic and atraumatic.  Eyes:  Extraocular muscles somewhat diminished.  Vision appears to be quite poor, most notably in left eye.  Neck: No JVD present. Carotid bruit is not present (He has a radiating bruit from Cody left upper extremity fistula).  Cardiovascular: Normal rate, regular rhythm, S1 normal and S2 normal.  Occasional extrasystoles are present. PMI is not displaced. Exam reveals distant heart sounds and decreased pulses (Palpable but diminished pedal pulses). Exam reveals no gallop and no friction rub.  Murmur (Soft 1/6 SEM at RUSB) heard. Pulmonary/Chest: Effort normal and breath sounds normal. No respiratory distress. He has no wheezes.  Abdominal: Soft. Bowel sounds are normal. He exhibits no distension. There is no abdominal tenderness.  Musculoskeletal:        General: No edema. Normal range of motion.     Cervical back: Normal range of motion and neck  supple.  Neurological: He is alert and oriented to person, place, and time.  Psychiatric: Cody behavior is normal. Judgment and thought content normal.  Somewhat flat/blunted affect     Adult ECG Report  Rate: 68 ;  Rhythm: normal sinus rhythm and Left atrial enlargement, nonspecific ST-T wave changes with repolarization  changes.;   Narrative Interpretation: Stable EKG.  No change  Recent Labs: No recent lipids. --> September 2020: Na+ 142, K+ 4.9.  Cl-113, CO2 21, BUN 52, Cr 7.03. Glu 109.  CA 2+  Lab Results  Component Value Date   CHOL 163 08/30/2017   HDL 78 08/30/2017   LDLCALC 65 08/30/2017   TRIG 102 08/30/2017   CHOLHDL 2.1 08/30/2017   Lab Results  Component Value Date   CREATININE 4.89 (H) 07/22/2017   BUN 47 (H) 07/22/2017   NA 141 07/22/2017   K 4.8 07/22/2017   CL 110 07/22/2017   CO2 23 07/22/2017    ASSESSMENT/PLAN    Problem List Items Addressed This Visit    CKD (chronic kidney disease), stage IV (HCC) - Primary (Chronic)   Relevant Orders   EKG 12-Lead (Completed)   Essential hypertension (Chronic)    Borderline blood pressure today.  He is on clonidine plus carvedilol and hydralazine.  Will defer to nephrology.  Would like to potentially use something else other than clonidine, but I am not sure what has been used in the past.  Consider calcium channel blocker.      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  Not currently on diuretic.  Would defer to PCP D and nephrology. Is on carvedilol and hydralazine combination along with clonidine.   He may be class I CHF symptoms, but difficult to assess because of Cody deconditioning and limited mobility.      Preoperative cardiovascular examination    Most recent evaluation in 2019 showed relatively normal echocardiogram  and on further review of relatively negative/nonischemic stress test in the absence of any symptoms.  I do suspect that as part of Cody transplant evaluation, he would potentially need another ischemic evaluation.  However he is currently not actively having symptoms and I therefore would not check at this point.  Would be reluctant to perform until we are at the stage where he is actually going to be needed having transplant or on dialysis, because with Cody baseline renal dysfunction, would probably not tolerate any contrast load from heart catheterization if there is abnormal findings.  We will defer additional preop evaluation until the decision is made about whether he will start dialysis versus transplant.         COVID-19 Education: The signs and symptoms of COVID-19 were discussed with the patient and how to seek care for testing (follow up with PCP or arrange E-visit).   The importance of social distancing was discussed today.  I spent a total of 50minutes with the patient and chart review. >  50% of the time was spent in direct patient consultation.  Additional time spent with chart review (studies, outside notes, etc): 8 Total Time: 70min   Current medicines are reviewed at length with the patient today.  (+/- concerns) n/a   Patient Instructions / Medication Changes & Studies & Tests Ordered   Patient Instructions  Medication Instructions:  No change to current medications  *If you need a refill on your cardiac medications before your next appointment, please call your pharmacy*  Lab Work: Not needed  Testing/Procedures: Not needed  Follow-Up: At [facility], you and your health needs are our priority.  As part of our continuing mission to provide you with exceptional heart care, we  have created designated Provider Care Teams.  These Care Teams include your primary Cardiologist (physician) and Advanced Practice Providers (APPs -  Physician Assistants and Nurse  Practitioners) who all work together to provide you with the care you need, when you need it.  Your next appointment:   12 month(s)  The format for your next appointment:   In Person  Provider:   Glenetta Hew, MD      Studies Ordered:   Orders Placed This Encounter  Procedures  . EKG 12-Lead     Glenetta Hew, M.D., M.S. Interventional Cardiologist   Pager # [phone] Phone # [phone] [address] [place], [place] [address]   Thank you for choosing Heartcare at [facility]!!

## 2019-05-20 NOTE — Patient Instructions (Signed)
Medication Instructions:  No change to current medications  *If you need a refill on your cardiac medications before your next appointment, please call your pharmacy*  Lab Work: Not needed  Testing/Procedures: Not needed  Follow-Up: At [facility], you and your health needs are our priority.  As part of our continuing mission to provide you with exceptional heart care, we have created designated Provider Care Teams.  These Care Teams include your primary Cardiologist (physician) and Advanced Practice Providers (APPs -  Physician Assistants and Nurse Practitioners) who all work together to provide you with the care you need, when you need it.  Your next appointment:   12 month(s)  The format for your next appointment:   In Person  Provider:   Glenetta Hew, MD

## 2019-05-21 ENCOUNTER — Encounter: Payer: Self-pay | Admitting: Cardiology

## 2019-05-21 DIAGNOSIS — N189 Chronic kidney disease, unspecified: Secondary | ICD-10-CM | POA: Diagnosis not present

## 2019-05-21 DIAGNOSIS — F4310 Post-traumatic stress disorder, unspecified: Secondary | ICD-10-CM | POA: Diagnosis not present

## 2019-05-21 DIAGNOSIS — E1165 Type 2 diabetes mellitus with hyperglycemia: Secondary | ICD-10-CM | POA: Diagnosis not present

## 2019-05-21 DIAGNOSIS — I10 Essential (primary) hypertension: Secondary | ICD-10-CM | POA: Diagnosis not present

## 2019-05-21 DIAGNOSIS — M545 Low back pain: Secondary | ICD-10-CM | POA: Diagnosis not present

## 2019-05-21 NOTE — Assessment & Plan Note (Signed)
Borderline blood pressure today.  He is on clonidine plus carvedilol and hydralazine.  Will defer to nephrology.  Would like to potentially use something else other than clonidine, but I am not sure what has been used in the past.  Consider calcium channel blocker.

## 2019-05-21 NOTE — Assessment & Plan Note (Signed)
Most recent evaluation in 2019 showed relatively normal echocardiogram and on further review of relatively negative/nonischemic stress test in the absence of any symptoms.  I do suspect that as part of his transplant evaluation, he would potentially need another ischemic evaluation.  However he is currently not actively having symptoms and I therefore would not check at this point.  Would be reluctant to perform until we are at the stage where he is actually going to be needed having transplant or on dialysis, because with his baseline renal dysfunction, would probably not tolerate any contrast load from heart catheterization if there is abnormal findings.  We will defer additional preop evaluation until the decision is made about whether he will start dialysis versus transplant.

## 2019-05-21 NOTE — Assessment & Plan Note (Signed)
Echocardiogram of the only showed grade 1 diastolic dysfunction 2 years ago with moderate LVH.  Not currently on diuretic.  Would defer to PCP D and nephrology. Is on carvedilol and hydralazine combination along with clonidine.   He may be class I CHF symptoms, but difficult to assess because of his deconditioning and limited mobility.

## 2019-05-22 DIAGNOSIS — F4310 Post-traumatic stress disorder, unspecified: Secondary | ICD-10-CM | POA: Diagnosis not present

## 2019-05-22 DIAGNOSIS — E1165 Type 2 diabetes mellitus with hyperglycemia: Secondary | ICD-10-CM | POA: Diagnosis not present

## 2019-05-22 DIAGNOSIS — N189 Chronic kidney disease, unspecified: Secondary | ICD-10-CM | POA: Diagnosis not present

## 2019-05-22 DIAGNOSIS — M545 Low back pain: Secondary | ICD-10-CM | POA: Diagnosis not present

## 2019-05-22 DIAGNOSIS — I10 Essential (primary) hypertension: Secondary | ICD-10-CM | POA: Diagnosis not present

## 2019-05-23 DIAGNOSIS — M545 Low back pain: Secondary | ICD-10-CM | POA: Diagnosis not present

## 2019-05-23 DIAGNOSIS — N189 Chronic kidney disease, unspecified: Secondary | ICD-10-CM | POA: Diagnosis not present

## 2019-05-23 DIAGNOSIS — F4310 Post-traumatic stress disorder, unspecified: Secondary | ICD-10-CM | POA: Diagnosis not present

## 2019-05-23 DIAGNOSIS — I10 Essential (primary) hypertension: Secondary | ICD-10-CM | POA: Diagnosis not present

## 2019-05-23 DIAGNOSIS — E1165 Type 2 diabetes mellitus with hyperglycemia: Secondary | ICD-10-CM | POA: Diagnosis not present

## 2019-05-24 DIAGNOSIS — N189 Chronic kidney disease, unspecified: Secondary | ICD-10-CM | POA: Diagnosis not present

## 2019-05-24 DIAGNOSIS — I10 Essential (primary) hypertension: Secondary | ICD-10-CM | POA: Diagnosis not present

## 2019-05-24 DIAGNOSIS — E1165 Type 2 diabetes mellitus with hyperglycemia: Secondary | ICD-10-CM | POA: Diagnosis not present

## 2019-05-24 DIAGNOSIS — M545 Low back pain: Secondary | ICD-10-CM | POA: Diagnosis not present

## 2019-05-24 DIAGNOSIS — F4310 Post-traumatic stress disorder, unspecified: Secondary | ICD-10-CM | POA: Diagnosis not present

## 2019-05-25 DIAGNOSIS — N189 Chronic kidney disease, unspecified: Secondary | ICD-10-CM | POA: Diagnosis not present

## 2019-05-25 DIAGNOSIS — E1165 Type 2 diabetes mellitus with hyperglycemia: Secondary | ICD-10-CM | POA: Diagnosis not present

## 2019-05-25 DIAGNOSIS — F4310 Post-traumatic stress disorder, unspecified: Secondary | ICD-10-CM | POA: Diagnosis not present

## 2019-05-25 DIAGNOSIS — I10 Essential (primary) hypertension: Secondary | ICD-10-CM | POA: Diagnosis not present

## 2019-05-25 DIAGNOSIS — M545 Low back pain: Secondary | ICD-10-CM | POA: Diagnosis not present

## 2019-05-27 ENCOUNTER — Telehealth: Payer: Self-pay | Admitting: Cardiology

## 2019-05-27 DIAGNOSIS — I10 Essential (primary) hypertension: Secondary | ICD-10-CM | POA: Diagnosis not present

## 2019-05-27 DIAGNOSIS — N189 Chronic kidney disease, unspecified: Secondary | ICD-10-CM | POA: Diagnosis not present

## 2019-05-27 DIAGNOSIS — E1165 Type 2 diabetes mellitus with hyperglycemia: Secondary | ICD-10-CM | POA: Diagnosis not present

## 2019-05-27 DIAGNOSIS — M545 Low back pain: Secondary | ICD-10-CM | POA: Diagnosis not present

## 2019-05-27 DIAGNOSIS — F4310 Post-traumatic stress disorder, unspecified: Secondary | ICD-10-CM | POA: Diagnosis not present

## 2019-05-27 MED ORDER — CLONIDINE HCL 0.1 MG PO TABS: 0.1000 mg | ORAL_TABLET | Freq: Three times a day (TID) | ORAL | 3 refills | Status: DC

## 2019-05-27 NOTE — Telephone Encounter (Signed)
New message      *STAT* If patient is at the pharmacy, call can be transferred to refill team.   1. Which medications need to be refilled? (please list name of each medication and dose if known) cloNIDine (CATAPRES) 0.1 MG tablet  2. Which pharmacy/location (including street and city if local pharmacy) is medication to be sent to?[place], [place]  3. Do they need a 30 day or 90 day supply?[place]

## 2019-05-28 DIAGNOSIS — E1165 Type 2 diabetes mellitus with hyperglycemia: Secondary | ICD-10-CM | POA: Diagnosis not present

## 2019-05-28 DIAGNOSIS — F4310 Post-traumatic stress disorder, unspecified: Secondary | ICD-10-CM | POA: Diagnosis not present

## 2019-05-28 DIAGNOSIS — N189 Chronic kidney disease, unspecified: Secondary | ICD-10-CM | POA: Diagnosis not present

## 2019-05-28 DIAGNOSIS — M545 Low back pain: Secondary | ICD-10-CM | POA: Diagnosis not present

## 2019-05-28 DIAGNOSIS — I10 Essential (primary) hypertension: Secondary | ICD-10-CM | POA: Diagnosis not present

## 2019-05-29 DIAGNOSIS — N189 Chronic kidney disease, unspecified: Secondary | ICD-10-CM | POA: Diagnosis not present

## 2019-05-29 DIAGNOSIS — I10 Essential (primary) hypertension: Secondary | ICD-10-CM | POA: Diagnosis not present

## 2019-05-29 DIAGNOSIS — N2581 Secondary hyperparathyroidism of renal origin: Secondary | ICD-10-CM | POA: Diagnosis not present

## 2019-05-29 DIAGNOSIS — M545 Low back pain: Secondary | ICD-10-CM | POA: Diagnosis not present

## 2019-05-29 DIAGNOSIS — N185 Chronic kidney disease, stage 5: Secondary | ICD-10-CM | POA: Diagnosis not present

## 2019-05-29 DIAGNOSIS — F4310 Post-traumatic stress disorder, unspecified: Secondary | ICD-10-CM | POA: Diagnosis not present

## 2019-05-29 DIAGNOSIS — E1165 Type 2 diabetes mellitus with hyperglycemia: Secondary | ICD-10-CM | POA: Diagnosis not present

## 2019-05-30 DIAGNOSIS — N189 Chronic kidney disease, unspecified: Secondary | ICD-10-CM | POA: Diagnosis not present

## 2019-05-30 DIAGNOSIS — M79604 Pain in right leg: Secondary | ICD-10-CM | POA: Diagnosis not present

## 2019-05-30 DIAGNOSIS — E1165 Type 2 diabetes mellitus with hyperglycemia: Secondary | ICD-10-CM | POA: Diagnosis not present

## 2019-05-30 DIAGNOSIS — I10 Essential (primary) hypertension: Secondary | ICD-10-CM | POA: Diagnosis not present

## 2019-05-30 DIAGNOSIS — M25571 Pain in right ankle and joints of right foot: Secondary | ICD-10-CM | POA: Diagnosis not present

## 2019-05-30 DIAGNOSIS — M79605 Pain in left leg: Secondary | ICD-10-CM | POA: Diagnosis not present

## 2019-05-30 DIAGNOSIS — M545 Low back pain: Secondary | ICD-10-CM | POA: Diagnosis not present

## 2019-05-30 DIAGNOSIS — F4310 Post-traumatic stress disorder, unspecified: Secondary | ICD-10-CM | POA: Diagnosis not present

## 2019-05-31 DIAGNOSIS — E1165 Type 2 diabetes mellitus with hyperglycemia: Secondary | ICD-10-CM | POA: Diagnosis not present

## 2019-05-31 DIAGNOSIS — F4310 Post-traumatic stress disorder, unspecified: Secondary | ICD-10-CM | POA: Diagnosis not present

## 2019-05-31 DIAGNOSIS — N189 Chronic kidney disease, unspecified: Secondary | ICD-10-CM | POA: Diagnosis not present

## 2019-05-31 DIAGNOSIS — M545 Low back pain: Secondary | ICD-10-CM | POA: Diagnosis not present

## 2019-05-31 DIAGNOSIS — I10 Essential (primary) hypertension: Secondary | ICD-10-CM | POA: Diagnosis not present

## 2019-06-01 DIAGNOSIS — F4310 Post-traumatic stress disorder, unspecified: Secondary | ICD-10-CM | POA: Diagnosis not present

## 2019-06-01 DIAGNOSIS — N189 Chronic kidney disease, unspecified: Secondary | ICD-10-CM | POA: Diagnosis not present

## 2019-06-01 DIAGNOSIS — I10 Essential (primary) hypertension: Secondary | ICD-10-CM | POA: Diagnosis not present

## 2019-06-01 DIAGNOSIS — M545 Low back pain: Secondary | ICD-10-CM | POA: Diagnosis not present

## 2019-06-01 DIAGNOSIS — E1165 Type 2 diabetes mellitus with hyperglycemia: Secondary | ICD-10-CM | POA: Diagnosis not present

## 2019-06-03 DIAGNOSIS — N189 Chronic kidney disease, unspecified: Secondary | ICD-10-CM | POA: Diagnosis not present

## 2019-06-03 DIAGNOSIS — I10 Essential (primary) hypertension: Secondary | ICD-10-CM | POA: Diagnosis not present

## 2019-06-03 DIAGNOSIS — F4310 Post-traumatic stress disorder, unspecified: Secondary | ICD-10-CM | POA: Diagnosis not present

## 2019-06-03 DIAGNOSIS — M545 Low back pain: Secondary | ICD-10-CM | POA: Diagnosis not present

## 2019-06-03 DIAGNOSIS — E1165 Type 2 diabetes mellitus with hyperglycemia: Secondary | ICD-10-CM | POA: Diagnosis not present

## 2019-06-04 DIAGNOSIS — I10 Essential (primary) hypertension: Secondary | ICD-10-CM | POA: Diagnosis not present

## 2019-06-04 DIAGNOSIS — M545 Low back pain: Secondary | ICD-10-CM | POA: Diagnosis not present

## 2019-06-04 DIAGNOSIS — F4310 Post-traumatic stress disorder, unspecified: Secondary | ICD-10-CM | POA: Diagnosis not present

## 2019-06-04 DIAGNOSIS — N189 Chronic kidney disease, unspecified: Secondary | ICD-10-CM | POA: Diagnosis not present

## 2019-06-04 DIAGNOSIS — E1165 Type 2 diabetes mellitus with hyperglycemia: Secondary | ICD-10-CM | POA: Diagnosis not present

## 2019-06-05 DIAGNOSIS — F4310 Post-traumatic stress disorder, unspecified: Secondary | ICD-10-CM | POA: Diagnosis not present

## 2019-06-05 DIAGNOSIS — E1165 Type 2 diabetes mellitus with hyperglycemia: Secondary | ICD-10-CM | POA: Diagnosis not present

## 2019-06-05 DIAGNOSIS — M545 Low back pain: Secondary | ICD-10-CM | POA: Diagnosis not present

## 2019-06-05 DIAGNOSIS — I10 Essential (primary) hypertension: Secondary | ICD-10-CM | POA: Diagnosis not present

## 2019-06-05 DIAGNOSIS — N189 Chronic kidney disease, unspecified: Secondary | ICD-10-CM | POA: Diagnosis not present

## 2019-06-06 DIAGNOSIS — E1165 Type 2 diabetes mellitus with hyperglycemia: Secondary | ICD-10-CM | POA: Diagnosis not present

## 2019-06-06 DIAGNOSIS — I10 Essential (primary) hypertension: Secondary | ICD-10-CM | POA: Diagnosis not present

## 2019-06-06 DIAGNOSIS — M545 Low back pain: Secondary | ICD-10-CM | POA: Diagnosis not present

## 2019-06-06 DIAGNOSIS — F4310 Post-traumatic stress disorder, unspecified: Secondary | ICD-10-CM | POA: Diagnosis not present

## 2019-06-06 DIAGNOSIS — N189 Chronic kidney disease, unspecified: Secondary | ICD-10-CM | POA: Diagnosis not present

## 2019-06-07 DIAGNOSIS — I10 Essential (primary) hypertension: Secondary | ICD-10-CM | POA: Diagnosis not present

## 2019-06-07 DIAGNOSIS — N189 Chronic kidney disease, unspecified: Secondary | ICD-10-CM | POA: Diagnosis not present

## 2019-06-07 DIAGNOSIS — F4310 Post-traumatic stress disorder, unspecified: Secondary | ICD-10-CM | POA: Diagnosis not present

## 2019-06-07 DIAGNOSIS — N186 End stage renal disease: Secondary | ICD-10-CM | POA: Diagnosis not present

## 2019-06-07 DIAGNOSIS — I5032 Chronic diastolic (congestive) heart failure: Secondary | ICD-10-CM | POA: Diagnosis not present

## 2019-06-07 DIAGNOSIS — E119 Type 2 diabetes mellitus without complications: Secondary | ICD-10-CM | POA: Diagnosis not present

## 2019-06-07 DIAGNOSIS — E1165 Type 2 diabetes mellitus with hyperglycemia: Secondary | ICD-10-CM | POA: Diagnosis not present

## 2019-06-07 DIAGNOSIS — M545 Low back pain: Secondary | ICD-10-CM | POA: Diagnosis not present

## 2019-06-08 DIAGNOSIS — I10 Essential (primary) hypertension: Secondary | ICD-10-CM | POA: Diagnosis not present

## 2019-06-08 DIAGNOSIS — N189 Chronic kidney disease, unspecified: Secondary | ICD-10-CM | POA: Diagnosis not present

## 2019-06-08 DIAGNOSIS — F4310 Post-traumatic stress disorder, unspecified: Secondary | ICD-10-CM | POA: Diagnosis not present

## 2019-06-08 DIAGNOSIS — M545 Low back pain: Secondary | ICD-10-CM | POA: Diagnosis not present

## 2019-06-08 DIAGNOSIS — E1165 Type 2 diabetes mellitus with hyperglycemia: Secondary | ICD-10-CM | POA: Diagnosis not present

## 2019-06-10 DIAGNOSIS — I10 Essential (primary) hypertension: Secondary | ICD-10-CM | POA: Diagnosis not present

## 2019-06-10 DIAGNOSIS — M545 Low back pain: Secondary | ICD-10-CM | POA: Diagnosis not present

## 2019-06-10 DIAGNOSIS — N189 Chronic kidney disease, unspecified: Secondary | ICD-10-CM | POA: Diagnosis not present

## 2019-06-10 DIAGNOSIS — E1165 Type 2 diabetes mellitus with hyperglycemia: Secondary | ICD-10-CM | POA: Diagnosis not present

## 2019-06-10 DIAGNOSIS — F4310 Post-traumatic stress disorder, unspecified: Secondary | ICD-10-CM | POA: Diagnosis not present

## 2019-06-11 DIAGNOSIS — N189 Chronic kidney disease, unspecified: Secondary | ICD-10-CM | POA: Diagnosis not present

## 2019-06-11 DIAGNOSIS — F4310 Post-traumatic stress disorder, unspecified: Secondary | ICD-10-CM | POA: Diagnosis not present

## 2019-06-11 DIAGNOSIS — I10 Essential (primary) hypertension: Secondary | ICD-10-CM | POA: Diagnosis not present

## 2019-06-11 DIAGNOSIS — E1165 Type 2 diabetes mellitus with hyperglycemia: Secondary | ICD-10-CM | POA: Diagnosis not present

## 2019-06-11 DIAGNOSIS — M545 Low back pain: Secondary | ICD-10-CM | POA: Diagnosis not present

## 2019-06-12 DIAGNOSIS — I10 Essential (primary) hypertension: Secondary | ICD-10-CM | POA: Diagnosis not present

## 2019-06-12 DIAGNOSIS — N189 Chronic kidney disease, unspecified: Secondary | ICD-10-CM | POA: Diagnosis not present

## 2019-06-12 DIAGNOSIS — F4310 Post-traumatic stress disorder, unspecified: Secondary | ICD-10-CM | POA: Diagnosis not present

## 2019-06-12 DIAGNOSIS — M545 Low back pain: Secondary | ICD-10-CM | POA: Diagnosis not present

## 2019-06-12 DIAGNOSIS — E1165 Type 2 diabetes mellitus with hyperglycemia: Secondary | ICD-10-CM | POA: Diagnosis not present

## 2019-06-13 DIAGNOSIS — M545 Low back pain: Secondary | ICD-10-CM | POA: Diagnosis not present

## 2019-06-13 DIAGNOSIS — N189 Chronic kidney disease, unspecified: Secondary | ICD-10-CM | POA: Diagnosis not present

## 2019-06-13 DIAGNOSIS — I10 Essential (primary) hypertension: Secondary | ICD-10-CM | POA: Diagnosis not present

## 2019-06-13 DIAGNOSIS — F4310 Post-traumatic stress disorder, unspecified: Secondary | ICD-10-CM | POA: Diagnosis not present

## 2019-06-13 DIAGNOSIS — E1165 Type 2 diabetes mellitus with hyperglycemia: Secondary | ICD-10-CM | POA: Diagnosis not present

## 2019-06-14 DIAGNOSIS — I10 Essential (primary) hypertension: Secondary | ICD-10-CM | POA: Diagnosis not present

## 2019-06-14 DIAGNOSIS — E1165 Type 2 diabetes mellitus with hyperglycemia: Secondary | ICD-10-CM | POA: Diagnosis not present

## 2019-06-14 DIAGNOSIS — M545 Low back pain: Secondary | ICD-10-CM | POA: Diagnosis not present

## 2019-06-14 DIAGNOSIS — F4310 Post-traumatic stress disorder, unspecified: Secondary | ICD-10-CM | POA: Diagnosis not present

## 2019-06-14 DIAGNOSIS — N189 Chronic kidney disease, unspecified: Secondary | ICD-10-CM | POA: Diagnosis not present

## 2019-06-15 DIAGNOSIS — M545 Low back pain: Secondary | ICD-10-CM | POA: Diagnosis not present

## 2019-06-15 DIAGNOSIS — F4310 Post-traumatic stress disorder, unspecified: Secondary | ICD-10-CM | POA: Diagnosis not present

## 2019-06-15 DIAGNOSIS — E1165 Type 2 diabetes mellitus with hyperglycemia: Secondary | ICD-10-CM | POA: Diagnosis not present

## 2019-06-15 DIAGNOSIS — N189 Chronic kidney disease, unspecified: Secondary | ICD-10-CM | POA: Diagnosis not present

## 2019-06-15 DIAGNOSIS — I10 Essential (primary) hypertension: Secondary | ICD-10-CM | POA: Diagnosis not present

## 2019-06-17 DIAGNOSIS — N189 Chronic kidney disease, unspecified: Secondary | ICD-10-CM | POA: Diagnosis not present

## 2019-06-17 DIAGNOSIS — I10 Essential (primary) hypertension: Secondary | ICD-10-CM | POA: Diagnosis not present

## 2019-06-17 DIAGNOSIS — F4310 Post-traumatic stress disorder, unspecified: Secondary | ICD-10-CM | POA: Diagnosis not present

## 2019-06-17 DIAGNOSIS — E1165 Type 2 diabetes mellitus with hyperglycemia: Secondary | ICD-10-CM | POA: Diagnosis not present

## 2019-06-17 DIAGNOSIS — M545 Low back pain: Secondary | ICD-10-CM | POA: Diagnosis not present

## 2019-06-18 DIAGNOSIS — M545 Low back pain: Secondary | ICD-10-CM | POA: Diagnosis not present

## 2019-06-18 DIAGNOSIS — N189 Chronic kidney disease, unspecified: Secondary | ICD-10-CM | POA: Diagnosis not present

## 2019-06-18 DIAGNOSIS — I10 Essential (primary) hypertension: Secondary | ICD-10-CM | POA: Diagnosis not present

## 2019-06-18 DIAGNOSIS — F4310 Post-traumatic stress disorder, unspecified: Secondary | ICD-10-CM | POA: Diagnosis not present

## 2019-06-18 DIAGNOSIS — E1165 Type 2 diabetes mellitus with hyperglycemia: Secondary | ICD-10-CM | POA: Diagnosis not present

## 2019-06-19 DIAGNOSIS — E1165 Type 2 diabetes mellitus with hyperglycemia: Secondary | ICD-10-CM | POA: Diagnosis not present

## 2019-06-19 DIAGNOSIS — F4310 Post-traumatic stress disorder, unspecified: Secondary | ICD-10-CM | POA: Diagnosis not present

## 2019-06-19 DIAGNOSIS — M545 Low back pain: Secondary | ICD-10-CM | POA: Diagnosis not present

## 2019-06-19 DIAGNOSIS — I10 Essential (primary) hypertension: Secondary | ICD-10-CM | POA: Diagnosis not present

## 2019-06-19 DIAGNOSIS — N189 Chronic kidney disease, unspecified: Secondary | ICD-10-CM | POA: Diagnosis not present

## 2019-06-20 DIAGNOSIS — N189 Chronic kidney disease, unspecified: Secondary | ICD-10-CM | POA: Diagnosis not present

## 2019-06-20 DIAGNOSIS — I10 Essential (primary) hypertension: Secondary | ICD-10-CM | POA: Diagnosis not present

## 2019-06-20 DIAGNOSIS — F4310 Post-traumatic stress disorder, unspecified: Secondary | ICD-10-CM | POA: Diagnosis not present

## 2019-06-20 DIAGNOSIS — E1165 Type 2 diabetes mellitus with hyperglycemia: Secondary | ICD-10-CM | POA: Diagnosis not present

## 2019-06-20 DIAGNOSIS — M545 Low back pain: Secondary | ICD-10-CM | POA: Diagnosis not present

## 2019-06-21 DIAGNOSIS — M545 Low back pain: Secondary | ICD-10-CM | POA: Diagnosis not present

## 2019-06-21 DIAGNOSIS — N189 Chronic kidney disease, unspecified: Secondary | ICD-10-CM | POA: Diagnosis not present

## 2019-06-21 DIAGNOSIS — F4310 Post-traumatic stress disorder, unspecified: Secondary | ICD-10-CM | POA: Diagnosis not present

## 2019-06-21 DIAGNOSIS — E1165 Type 2 diabetes mellitus with hyperglycemia: Secondary | ICD-10-CM | POA: Diagnosis not present

## 2019-06-21 DIAGNOSIS — I10 Essential (primary) hypertension: Secondary | ICD-10-CM | POA: Diagnosis not present

## 2019-06-22 DIAGNOSIS — N189 Chronic kidney disease, unspecified: Secondary | ICD-10-CM | POA: Diagnosis not present

## 2019-06-22 DIAGNOSIS — I10 Essential (primary) hypertension: Secondary | ICD-10-CM | POA: Diagnosis not present

## 2019-06-22 DIAGNOSIS — F4310 Post-traumatic stress disorder, unspecified: Secondary | ICD-10-CM | POA: Diagnosis not present

## 2019-06-22 DIAGNOSIS — M545 Low back pain: Secondary | ICD-10-CM | POA: Diagnosis not present

## 2019-06-22 DIAGNOSIS — E1165 Type 2 diabetes mellitus with hyperglycemia: Secondary | ICD-10-CM | POA: Diagnosis not present

## 2019-06-23 NOTE — Progress Notes (Signed)
Labs from May 30, 2019: None ESRD on HD Na+ 145, K+ 5.6, Cl- 112, HCO3-17, BUN 43, Cr 6.26, Glu 100, Ca2+ 8.7; AST 24, ALT 31, AlkP 494 CBC: W 6.2, H/H 9.9/38.9, Plt 181 TC 227, TG 136, HDL 67, LDL 136  Glenetta Hew, MD

## 2019-06-24 DIAGNOSIS — M545 Low back pain: Secondary | ICD-10-CM | POA: Diagnosis not present

## 2019-06-24 DIAGNOSIS — F4310 Post-traumatic stress disorder, unspecified: Secondary | ICD-10-CM | POA: Diagnosis not present

## 2019-06-24 DIAGNOSIS — I10 Essential (primary) hypertension: Secondary | ICD-10-CM | POA: Diagnosis not present

## 2019-06-24 DIAGNOSIS — N189 Chronic kidney disease, unspecified: Secondary | ICD-10-CM | POA: Diagnosis not present

## 2019-06-24 DIAGNOSIS — E1165 Type 2 diabetes mellitus with hyperglycemia: Secondary | ICD-10-CM | POA: Diagnosis not present

## 2019-06-25 DIAGNOSIS — F4310 Post-traumatic stress disorder, unspecified: Secondary | ICD-10-CM | POA: Diagnosis not present

## 2019-06-25 DIAGNOSIS — N189 Chronic kidney disease, unspecified: Secondary | ICD-10-CM | POA: Diagnosis not present

## 2019-06-25 DIAGNOSIS — I10 Essential (primary) hypertension: Secondary | ICD-10-CM | POA: Diagnosis not present

## 2019-06-25 DIAGNOSIS — M545 Low back pain: Secondary | ICD-10-CM | POA: Diagnosis not present

## 2019-06-25 DIAGNOSIS — E1165 Type 2 diabetes mellitus with hyperglycemia: Secondary | ICD-10-CM | POA: Diagnosis not present

## 2019-06-26 DIAGNOSIS — M545 Low back pain: Secondary | ICD-10-CM | POA: Diagnosis not present

## 2019-06-26 DIAGNOSIS — E1165 Type 2 diabetes mellitus with hyperglycemia: Secondary | ICD-10-CM | POA: Diagnosis not present

## 2019-06-26 DIAGNOSIS — F4310 Post-traumatic stress disorder, unspecified: Secondary | ICD-10-CM | POA: Diagnosis not present

## 2019-06-26 DIAGNOSIS — I10 Essential (primary) hypertension: Secondary | ICD-10-CM | POA: Diagnosis not present

## 2019-06-26 DIAGNOSIS — N189 Chronic kidney disease, unspecified: Secondary | ICD-10-CM | POA: Diagnosis not present

## 2019-06-27 DIAGNOSIS — M25571 Pain in right ankle and joints of right foot: Secondary | ICD-10-CM | POA: Diagnosis not present

## 2019-06-27 DIAGNOSIS — M79604 Pain in right leg: Secondary | ICD-10-CM | POA: Diagnosis not present

## 2019-06-27 DIAGNOSIS — I10 Essential (primary) hypertension: Secondary | ICD-10-CM | POA: Diagnosis not present

## 2019-06-27 DIAGNOSIS — M79605 Pain in left leg: Secondary | ICD-10-CM | POA: Diagnosis not present

## 2019-06-27 DIAGNOSIS — F4310 Post-traumatic stress disorder, unspecified: Secondary | ICD-10-CM | POA: Diagnosis not present

## 2019-06-27 DIAGNOSIS — M545 Low back pain: Secondary | ICD-10-CM | POA: Diagnosis not present

## 2019-06-27 DIAGNOSIS — N189 Chronic kidney disease, unspecified: Secondary | ICD-10-CM | POA: Diagnosis not present

## 2019-06-27 DIAGNOSIS — E1165 Type 2 diabetes mellitus with hyperglycemia: Secondary | ICD-10-CM | POA: Diagnosis not present

## 2019-06-28 DIAGNOSIS — E1165 Type 2 diabetes mellitus with hyperglycemia: Secondary | ICD-10-CM | POA: Diagnosis not present

## 2019-06-28 DIAGNOSIS — M545 Low back pain: Secondary | ICD-10-CM | POA: Diagnosis not present

## 2019-06-28 DIAGNOSIS — F4310 Post-traumatic stress disorder, unspecified: Secondary | ICD-10-CM | POA: Diagnosis not present

## 2019-06-28 DIAGNOSIS — N185 Chronic kidney disease, stage 5: Secondary | ICD-10-CM | POA: Diagnosis not present

## 2019-06-28 DIAGNOSIS — N189 Chronic kidney disease, unspecified: Secondary | ICD-10-CM | POA: Diagnosis not present

## 2019-06-28 DIAGNOSIS — I10 Essential (primary) hypertension: Secondary | ICD-10-CM | POA: Diagnosis not present

## 2019-06-29 DIAGNOSIS — F4310 Post-traumatic stress disorder, unspecified: Secondary | ICD-10-CM | POA: Diagnosis not present

## 2019-06-29 DIAGNOSIS — E1165 Type 2 diabetes mellitus with hyperglycemia: Secondary | ICD-10-CM | POA: Diagnosis not present

## 2019-06-29 DIAGNOSIS — N189 Chronic kidney disease, unspecified: Secondary | ICD-10-CM | POA: Diagnosis not present

## 2019-06-29 DIAGNOSIS — M545 Low back pain: Secondary | ICD-10-CM | POA: Diagnosis not present

## 2019-06-29 DIAGNOSIS — I10 Essential (primary) hypertension: Secondary | ICD-10-CM | POA: Diagnosis not present

## 2019-07-01 DIAGNOSIS — N189 Chronic kidney disease, unspecified: Secondary | ICD-10-CM | POA: Diagnosis not present

## 2019-07-01 DIAGNOSIS — E1165 Type 2 diabetes mellitus with hyperglycemia: Secondary | ICD-10-CM | POA: Diagnosis not present

## 2019-07-01 DIAGNOSIS — M545 Low back pain: Secondary | ICD-10-CM | POA: Diagnosis not present

## 2019-07-01 DIAGNOSIS — I10 Essential (primary) hypertension: Secondary | ICD-10-CM | POA: Diagnosis not present

## 2019-07-01 DIAGNOSIS — F4310 Post-traumatic stress disorder, unspecified: Secondary | ICD-10-CM | POA: Diagnosis not present

## 2019-07-02 DIAGNOSIS — M545 Low back pain: Secondary | ICD-10-CM | POA: Diagnosis not present

## 2019-07-02 DIAGNOSIS — F4310 Post-traumatic stress disorder, unspecified: Secondary | ICD-10-CM | POA: Diagnosis not present

## 2019-07-02 DIAGNOSIS — N189 Chronic kidney disease, unspecified: Secondary | ICD-10-CM | POA: Diagnosis not present

## 2019-07-02 DIAGNOSIS — N185 Chronic kidney disease, stage 5: Secondary | ICD-10-CM | POA: Diagnosis not present

## 2019-07-02 DIAGNOSIS — E1165 Type 2 diabetes mellitus with hyperglycemia: Secondary | ICD-10-CM | POA: Diagnosis not present

## 2019-07-02 DIAGNOSIS — I10 Essential (primary) hypertension: Secondary | ICD-10-CM | POA: Diagnosis not present

## 2019-07-03 DIAGNOSIS — I10 Essential (primary) hypertension: Secondary | ICD-10-CM | POA: Diagnosis not present

## 2019-07-03 DIAGNOSIS — F4310 Post-traumatic stress disorder, unspecified: Secondary | ICD-10-CM | POA: Diagnosis not present

## 2019-07-03 DIAGNOSIS — N189 Chronic kidney disease, unspecified: Secondary | ICD-10-CM | POA: Diagnosis not present

## 2019-07-03 DIAGNOSIS — M545 Low back pain: Secondary | ICD-10-CM | POA: Diagnosis not present

## 2019-07-03 DIAGNOSIS — E1165 Type 2 diabetes mellitus with hyperglycemia: Secondary | ICD-10-CM | POA: Diagnosis not present

## 2019-07-04 DIAGNOSIS — I10 Essential (primary) hypertension: Secondary | ICD-10-CM | POA: Diagnosis not present

## 2019-07-04 DIAGNOSIS — F4310 Post-traumatic stress disorder, unspecified: Secondary | ICD-10-CM | POA: Diagnosis not present

## 2019-07-04 DIAGNOSIS — E1165 Type 2 diabetes mellitus with hyperglycemia: Secondary | ICD-10-CM | POA: Diagnosis not present

## 2019-07-04 DIAGNOSIS — N189 Chronic kidney disease, unspecified: Secondary | ICD-10-CM | POA: Diagnosis not present

## 2019-07-04 DIAGNOSIS — M545 Low back pain: Secondary | ICD-10-CM | POA: Diagnosis not present

## 2019-07-05 DIAGNOSIS — F4310 Post-traumatic stress disorder, unspecified: Secondary | ICD-10-CM | POA: Diagnosis not present

## 2019-07-05 DIAGNOSIS — E1165 Type 2 diabetes mellitus with hyperglycemia: Secondary | ICD-10-CM | POA: Diagnosis not present

## 2019-07-05 DIAGNOSIS — I10 Essential (primary) hypertension: Secondary | ICD-10-CM | POA: Diagnosis not present

## 2019-07-05 DIAGNOSIS — M545 Low back pain: Secondary | ICD-10-CM | POA: Diagnosis not present

## 2019-07-05 DIAGNOSIS — N189 Chronic kidney disease, unspecified: Secondary | ICD-10-CM | POA: Diagnosis not present

## 2019-07-06 DIAGNOSIS — M545 Low back pain: Secondary | ICD-10-CM | POA: Diagnosis not present

## 2019-07-06 DIAGNOSIS — N189 Chronic kidney disease, unspecified: Secondary | ICD-10-CM | POA: Diagnosis not present

## 2019-07-06 DIAGNOSIS — I10 Essential (primary) hypertension: Secondary | ICD-10-CM | POA: Diagnosis not present

## 2019-07-06 DIAGNOSIS — F4310 Post-traumatic stress disorder, unspecified: Secondary | ICD-10-CM | POA: Diagnosis not present

## 2019-07-06 DIAGNOSIS — E1165 Type 2 diabetes mellitus with hyperglycemia: Secondary | ICD-10-CM | POA: Diagnosis not present

## 2019-07-08 DIAGNOSIS — I10 Essential (primary) hypertension: Secondary | ICD-10-CM | POA: Diagnosis not present

## 2019-07-08 DIAGNOSIS — E1165 Type 2 diabetes mellitus with hyperglycemia: Secondary | ICD-10-CM | POA: Diagnosis not present

## 2019-07-08 DIAGNOSIS — F4310 Post-traumatic stress disorder, unspecified: Secondary | ICD-10-CM | POA: Diagnosis not present

## 2019-07-08 DIAGNOSIS — M545 Low back pain: Secondary | ICD-10-CM | POA: Diagnosis not present

## 2019-07-08 DIAGNOSIS — N189 Chronic kidney disease, unspecified: Secondary | ICD-10-CM | POA: Diagnosis not present

## 2019-07-09 DIAGNOSIS — N189 Chronic kidney disease, unspecified: Secondary | ICD-10-CM | POA: Diagnosis not present

## 2019-07-09 DIAGNOSIS — E1165 Type 2 diabetes mellitus with hyperglycemia: Secondary | ICD-10-CM | POA: Diagnosis not present

## 2019-07-09 DIAGNOSIS — F4310 Post-traumatic stress disorder, unspecified: Secondary | ICD-10-CM | POA: Diagnosis not present

## 2019-07-09 DIAGNOSIS — M545 Low back pain: Secondary | ICD-10-CM | POA: Diagnosis not present

## 2019-07-09 DIAGNOSIS — I10 Essential (primary) hypertension: Secondary | ICD-10-CM | POA: Diagnosis not present

## 2019-07-10 DIAGNOSIS — F4310 Post-traumatic stress disorder, unspecified: Secondary | ICD-10-CM | POA: Diagnosis not present

## 2019-07-10 DIAGNOSIS — N189 Chronic kidney disease, unspecified: Secondary | ICD-10-CM | POA: Diagnosis not present

## 2019-07-10 DIAGNOSIS — E1165 Type 2 diabetes mellitus with hyperglycemia: Secondary | ICD-10-CM | POA: Diagnosis not present

## 2019-07-10 DIAGNOSIS — M545 Low back pain: Secondary | ICD-10-CM | POA: Diagnosis not present

## 2019-07-10 DIAGNOSIS — I10 Essential (primary) hypertension: Secondary | ICD-10-CM | POA: Diagnosis not present

## 2019-07-11 DIAGNOSIS — E1165 Type 2 diabetes mellitus with hyperglycemia: Secondary | ICD-10-CM | POA: Diagnosis not present

## 2019-07-11 DIAGNOSIS — F4310 Post-traumatic stress disorder, unspecified: Secondary | ICD-10-CM | POA: Diagnosis not present

## 2019-07-11 DIAGNOSIS — N189 Chronic kidney disease, unspecified: Secondary | ICD-10-CM | POA: Diagnosis not present

## 2019-07-11 DIAGNOSIS — I10 Essential (primary) hypertension: Secondary | ICD-10-CM | POA: Diagnosis not present

## 2019-07-11 DIAGNOSIS — M545 Low back pain: Secondary | ICD-10-CM | POA: Diagnosis not present

## 2019-07-12 DIAGNOSIS — I10 Essential (primary) hypertension: Secondary | ICD-10-CM | POA: Diagnosis not present

## 2019-07-12 DIAGNOSIS — F4310 Post-traumatic stress disorder, unspecified: Secondary | ICD-10-CM | POA: Diagnosis not present

## 2019-07-12 DIAGNOSIS — E1165 Type 2 diabetes mellitus with hyperglycemia: Secondary | ICD-10-CM | POA: Diagnosis not present

## 2019-07-12 DIAGNOSIS — N189 Chronic kidney disease, unspecified: Secondary | ICD-10-CM | POA: Diagnosis not present

## 2019-07-12 DIAGNOSIS — M545 Low back pain: Secondary | ICD-10-CM | POA: Diagnosis not present

## 2019-07-13 DIAGNOSIS — I10 Essential (primary) hypertension: Secondary | ICD-10-CM | POA: Diagnosis not present

## 2019-07-13 DIAGNOSIS — M545 Low back pain: Secondary | ICD-10-CM | POA: Diagnosis not present

## 2019-07-13 DIAGNOSIS — E1165 Type 2 diabetes mellitus with hyperglycemia: Secondary | ICD-10-CM | POA: Diagnosis not present

## 2019-07-13 DIAGNOSIS — F4310 Post-traumatic stress disorder, unspecified: Secondary | ICD-10-CM | POA: Diagnosis not present

## 2019-07-13 DIAGNOSIS — N189 Chronic kidney disease, unspecified: Secondary | ICD-10-CM | POA: Diagnosis not present

## 2019-07-15 DIAGNOSIS — F4310 Post-traumatic stress disorder, unspecified: Secondary | ICD-10-CM | POA: Diagnosis not present

## 2019-07-15 DIAGNOSIS — I10 Essential (primary) hypertension: Secondary | ICD-10-CM | POA: Diagnosis not present

## 2019-07-15 DIAGNOSIS — M545 Low back pain: Secondary | ICD-10-CM | POA: Diagnosis not present

## 2019-07-15 DIAGNOSIS — N189 Chronic kidney disease, unspecified: Secondary | ICD-10-CM | POA: Diagnosis not present

## 2019-07-15 DIAGNOSIS — E1165 Type 2 diabetes mellitus with hyperglycemia: Secondary | ICD-10-CM | POA: Diagnosis not present

## 2019-07-16 ENCOUNTER — Ambulatory Visit (INDEPENDENT_AMBULATORY_CARE_PROVIDER_SITE_OTHER): Payer: Medicaid Other | Admitting: Primary Care

## 2019-07-16 ENCOUNTER — Other Ambulatory Visit: Payer: Self-pay

## 2019-07-16 ENCOUNTER — Encounter (INDEPENDENT_AMBULATORY_CARE_PROVIDER_SITE_OTHER): Payer: Self-pay | Admitting: Primary Care

## 2019-07-16 DIAGNOSIS — I132 Hypertensive heart and chronic kidney disease with heart failure and with stage 5 chronic kidney disease, or end stage renal disease: Secondary | ICD-10-CM

## 2019-07-16 DIAGNOSIS — I10 Essential (primary) hypertension: Secondary | ICD-10-CM | POA: Diagnosis not present

## 2019-07-16 DIAGNOSIS — N185 Chronic kidney disease, stage 5: Secondary | ICD-10-CM

## 2019-07-16 DIAGNOSIS — I5030 Unspecified diastolic (congestive) heart failure: Secondary | ICD-10-CM

## 2019-07-16 DIAGNOSIS — L97421 Non-pressure chronic ulcer of left heel and midfoot limited to breakdown of skin: Secondary | ICD-10-CM

## 2019-07-16 DIAGNOSIS — Z7689 Persons encountering health services in other specified circumstances: Secondary | ICD-10-CM

## 2019-07-16 DIAGNOSIS — M545 Low back pain: Secondary | ICD-10-CM | POA: Diagnosis not present

## 2019-07-16 DIAGNOSIS — F4310 Post-traumatic stress disorder, unspecified: Secondary | ICD-10-CM | POA: Diagnosis not present

## 2019-07-16 DIAGNOSIS — E119 Type 2 diabetes mellitus without complications: Secondary | ICD-10-CM | POA: Diagnosis not present

## 2019-07-16 DIAGNOSIS — E08621 Diabetes mellitus due to underlying condition with foot ulcer: Secondary | ICD-10-CM

## 2019-07-16 DIAGNOSIS — E1165 Type 2 diabetes mellitus with hyperglycemia: Secondary | ICD-10-CM | POA: Diagnosis not present

## 2019-07-16 DIAGNOSIS — F17200 Nicotine dependence, unspecified, uncomplicated: Secondary | ICD-10-CM | POA: Diagnosis not present

## 2019-07-16 DIAGNOSIS — N189 Chronic kidney disease, unspecified: Secondary | ICD-10-CM | POA: Diagnosis not present

## 2019-07-16 MED ORDER — BUPROPION HCL ER (XL) 150 MG PO TB24: 150.0000 mg | ORAL_TABLET | Freq: Every day | ORAL | 1 refills | Status: DC

## 2019-07-16 NOTE — Progress Notes (Signed)
Virtual Visit via Telephone Note  I connected with Cody Webster on 07/16/19 at 10:30 AM EST by telephone and verified that I am speaking with the correct person using two identifiers.   I discussed the limitations, risks, security and privacy concerns of performing an evaluation and management service by telephone and the availability of in person appointments. I also discussed with the patient that there may be a patient responsible charge related to this service. The patient expressed understanding and agreed to proceed.   History of Present Illness: Cody Webster is establishing care at [facility] well known to me was previous provider at a different location. Consequentially he found out I am practicing  at [facility] and transfer care.  He voices concerns that his left foot has small area on it and would like to be seen by Dr. Sharol Given states he could not get an appointment referral made and Dr. Sharol Given made aware. Patient is followed by nephrologist awaiting a kidney transplant and cardiology for chronic diastolic  CHF. Past Medical History:  Diagnosis Date  . Carotid artery occlusion   . 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 Diabetes mellitus without complication (Cody Webster)    type 2  . GERD (gastroesophageal reflux disease)   . Hypertension   . 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 Peripheral neuropathy   . Pneumonia   . Seizures ([place])    27 years ago   Current Outpatient Medications on File Prior to Visit  Medication Sig Dispense Refill  . acetaminophen (TYLENOL) 500 MG tablet Take 1,000 mg by mouth every 6 (six) hours as needed (for pain/headaches.).    Marland Kitchen aspirin EC 81 MG tablet Take 81 mg by mouth daily.    . carvedilol (COREG) 25 MG tablet Take 1 tablet (25 mg total) by mouth 2 (two) times daily with a meal. Reported on 05/27/2015 180 tablet 3  . cetirizine  (ZYRTEC) 10 MG tablet Take 10 mg by mouth daily.  6  . cloNIDine (CATAPRES) 0.1 MG tablet Take 1 tablet (0.1 mg total) by mouth 3 (three) times daily. 270 tablet 3  . gabapentin (NEURONTIN) 300 MG capsule TAKE 1 CAPSULE (300 MG TOTAL) BY MOUTH 2 (TWO) TIMES DAILY. 60 capsule 3  . hydrALAZINE (APRESOLINE) 50 MG tablet TAKE 2 TABLETS (100 MG TOTAL) BY MOUTH 3 (THREE) TIMES DAILY. 180 tablet 2  . oxyCODONE-acetaminophen (PERCOCET) 10-325 MG tablet Take 1 tablet by mouth every 6 (six) hours as needed. For pain. 8 tablet 0  . traZODone (DESYREL) 50 MG tablet Take 50 mg by mouth at bedtime.  3  . atorvastatin (LIPITOR) 80 MG tablet Take 1 tablet (80 mg total) by mouth daily at 6 PM. (Patient not taking: Reported on 07/16/2019) 90 tablet 3  . nitroGLYCERIN (NITROSTAT) 0.4 MG SL tablet Place 1 tablet (0.4 mg total) under the tongue every 5 (five) minutes as needed for chest pain (CP or SOB). 30 tablet 0  . sitaGLIPtin (JANUVIA) 25 MG tablet Take 1 tablet (25 mg total) by mouth daily. (Patient not taking: Reported on 07/16/2019) 30 tablet 0   No current facility-administered medications on file prior to visit.   Observations/Objective: Review of Systems  Eyes: Positive for blurred vision.  Skin:       Area on left foot concerns   All other systems reviewed and are  negative.   Assessment and Plan: Cody Webster was seen today for herpes zoster.  Diagnoses and all orders for this visit:  Encounter to establish care Juluis Mire, NP-C will be your  (PCP) she is mastered prepared . She is skilled to diagnosed and treat illness. Also able to answer health concern as well as continuing care of varied medical conditions, not limited by cause, organ system, or diagnosis.   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 Managing with diet modification and exercise as tolerated   Tobacco use disorder Agreed to medication- this will also help with vasculatation after kidney transplant  he is aware of  increased risk for lung cancer and other respiratory diseases recommend cessation.  This will be reminded at each clinical visit.  -     buPROPion (WELLBUTRIN XL) 150 MG 24 hr tablet; Take 1 tablet (150 mg total) by mouth daily.  Diabetic ulcer of left midfoot associated with diabetes mellitus due to underlying condition, limited to breakdown of skin ([place]) History of amputation refer to Dr. Sharol Given  I discussed the assessment and treatment plan with the patient. The patient was provided an opportunity to ask questions and all were answered. The patient agreed with the plan and demonstrated an understanding of the instructions.   The patient was advised to call back or seek an in-person evaluation if the symptoms worsen or if the condition fails to improve as anticipated.  I provided 18  minutes of non-face-to-face time during this encounter. Reviewing previous encounters, labs, procedures and  imaging   Kerin Perna, NP

## 2019-07-16 NOTE — Patient Instructions (Signed)
Bupropion sustained-release tablets (smoking cessation) What is this medicine? BUPROPION (byoo PROE pee on) is used to help people quit smoking. This medicine may be used for other purposes; ask your health care provider or pharmacist if you have questions. COMMON BRAND NAME(S): Buproban, Zyban What should I tell my health care provider before I take this medicine? They need to know if you have any of these conditions:  an eating disorder, such as anorexia or bulimia  bipolar disorder or psychosis  diabetes or high blood sugar, treated with medication  glaucoma  head injury or brain tumor  heart disease, previous heart attack, or irregular heart beat  high blood pressure  kidney or liver disease  seizures  suicidal thoughts or a previous suicide attempt  Tourette's syndrome  weight loss  an unusual or allergic reaction to bupropion, other medicines, foods, dyes, or preservatives  breast-feeding  pregnant or trying to become pregnant How should I use this medicine? Take this medicine by mouth with a glass of water. Follow the directions on the prescription label. You can take it with or without food. If it upsets your stomach, take it with food. Do not cut, crush or chew this medicine. Take your medicine at regular intervals. If you take this medicine more than once a day, take your second dose at least 8 hours after you take your first dose. To limit difficulty in sleeping, avoid taking this medicine at bedtime. Do not take your medicine more often than directed. Do not stop taking this medicine suddenly except upon the advice of your doctor. Stopping this medicine too quickly may cause serious side effects. A special MedGuide will be given to you by the pharmacist with each prescription and refill. Be sure to read this information carefully each time. Talk to your pediatrician regarding the use of this medicine in children. Special care may be needed. Overdosage: If you  think you have taken too much of this medicine contact a poison control center or emergency room at once. NOTE: This medicine is only for you. Do not share this medicine with others. What if I miss a dose? If you miss a dose, skip the missed dose and take your next tablet at the regular time. There should be at least 8 hours between doses. Do not take double or extra doses. What may interact with this medicine? Do not take this medicine with any of the following medications:  linezolid  MAOIs like Azilect, Carbex, Eldepryl, Marplan, Nardil, and Parnate  methylene blue (injected into a vein)  other medicines that contain bupropion like Wellbutrin This medicine may also interact with the following medications:  alcohol  certain medicines for anxiety or sleep  certain medicines for blood pressure like metoprolol, propranolol  certain medicines for depression or psychotic disturbances  certain medicines for HIV or AIDS like efavirenz, lopinavir, nelfinavir, ritonavir  certain medicines for irregular heart beat like propafenone, flecainide  certain medicines for Parkinson's disease like amantadine, levodopa  certain medicines for seizures like carbamazepine, phenytoin, phenobarbital  cimetidine  clopidogrel  cyclophosphamide  digoxin  furazolidone  isoniazid  nicotine  orphenadrine  procarbazine  steroid medicines like prednisone or cortisone  stimulant medicines for attention disorders, weight loss, or to stay awake  tamoxifen  theophylline  thiotepa  ticlopidine  tramadol  warfarin This list may not describe all possible interactions. Give your health care provider a list of all the medicines, herbs, non-prescription drugs, or dietary supplements you use. Also tell them if you smoke,   drink alcohol, or use illegal drugs. Some items may interact with your medicine. What should I watch for while using this medicine? Visit your doctor or healthcare provider  for regular checks on your progress. This medicine should be used together with a patient support program. It is important to participate in a behavioral program, counseling, or other support program that is recommended by your healthcare provider. This medicine may cause serious skin reactions. They can happen weeks to months after starting the medicine. Contact your healthcare provider right away if you notice fevers or flu-like symptoms with a rash. The rash may be red or purple and then turn into blisters or peeling of the skin. Or, you might notice a red rash with swelling of the face, lips or lymph nodes in your neck or under your arms. Patients and their families should watch out for new or worsening thoughts of suicide or depression. Also watch out for sudden changes in feelings such as feeling anxious, agitated, panicky, irritable, hostile, aggressive, impulsive, severely restless, overly excited and hyperactive, or not being able to sleep. If this happens, especially at the beginning of treatment or after a change in dose, call your healthcare provider. Avoid alcoholic drinks while taking this medicine. Drinking excessive alcoholic beverages, using sleeping or anxiety medicines, or quickly stopping the use of these agents while taking this medicine may increase your risk for a seizure. Do not drive or use heavy machinery until you know how this medicine affects you. This medicine can impair your ability to perform these tasks. Do not take this medicine close to bedtime. It may prevent you from sleeping. Your mouth may get dry. Chewing sugarless gum or sucking hard candy, and drinking plenty of water may help. Contact your doctor if the problem does not go away or is severe. Do not use nicotine patches or chewing gum without the advice of your doctor or healthcare provider while taking this medicine. You may need to have your blood pressure taken regularly if your doctor recommends that you use both  nicotine and this medicine together. What side effects may I notice from receiving this medicine? Side effects that you should report to your doctor or health care professional as soon as possible:  allergic reactions like skin rash, itching or hives, swelling of the face, lips, or tongue  breathing problems  changes in vision  confusion  elevated mood, decreased need for sleep, racing thoughts, impulsive behavior  fast or irregular heartbeat  hallucinations, loss of contact with reality  increased blood pressure  rash, fever, and swollen lymph nodes  redness, blistering, peeling, or loosening of the skin, including inside the mouth  seizures  suicidal thoughts or other mood changes  unusually weak or tired  vomiting Side effects that usually do not require medical attention (report to your doctor or health care professional if they continue or are bothersome):  constipation  headache  loss of appetite  nausea  tremors  weight loss This list may not describe all possible side effects. Call your doctor for medical advice about side effects. You may report side effects to FDA at 1-800-FDA-1088. Where should I keep my medicine? Keep out of the reach of children. Store at room temperature between 20 and 25 degrees C (68 and 77 degrees F). Protect from light. Keep container tightly closed. Throw away any unused medicine after the expiration date. NOTE: This sheet is a summary. It may not cover all possible information. If you have questions about this medicine,   talk to your doctor, pharmacist, or health care provider.  2020 Elsevier/Gold Standard (2018-07-26 13:59:09)  

## 2019-07-16 NOTE — Progress Notes (Signed)
Pt would like to know if it is ok for him to get a shingles vaccine Pt states he took all three antihypertensives and believes it caused to drop significantly. Would like to discuss which medication he needs to come off of. He is now only taking clonidine and hydralazine for Bp and believes it is controlling Bp just fine.

## 2019-07-17 DIAGNOSIS — E1165 Type 2 diabetes mellitus with hyperglycemia: Secondary | ICD-10-CM | POA: Diagnosis not present

## 2019-07-17 DIAGNOSIS — I10 Essential (primary) hypertension: Secondary | ICD-10-CM | POA: Diagnosis not present

## 2019-07-17 DIAGNOSIS — N189 Chronic kidney disease, unspecified: Secondary | ICD-10-CM | POA: Diagnosis not present

## 2019-07-17 DIAGNOSIS — M545 Low back pain: Secondary | ICD-10-CM | POA: Diagnosis not present

## 2019-07-17 DIAGNOSIS — F4310 Post-traumatic stress disorder, unspecified: Secondary | ICD-10-CM | POA: Diagnosis not present

## 2019-07-18 ENCOUNTER — Ambulatory Visit (INDEPENDENT_AMBULATORY_CARE_PROVIDER_SITE_OTHER): Payer: Medicaid Other | Admitting: Orthopedic Surgery

## 2019-07-18 ENCOUNTER — Other Ambulatory Visit: Payer: Self-pay

## 2019-07-18 ENCOUNTER — Encounter: Payer: Self-pay | Admitting: Orthopedic Surgery

## 2019-07-18 DIAGNOSIS — E10621 Type 1 diabetes mellitus with foot ulcer: Secondary | ICD-10-CM | POA: Diagnosis not present

## 2019-07-18 DIAGNOSIS — M545 Low back pain: Secondary | ICD-10-CM | POA: Diagnosis not present

## 2019-07-18 DIAGNOSIS — N189 Chronic kidney disease, unspecified: Secondary | ICD-10-CM | POA: Diagnosis not present

## 2019-07-18 DIAGNOSIS — B351 Tinea unguium: Secondary | ICD-10-CM

## 2019-07-18 DIAGNOSIS — I10 Essential (primary) hypertension: Secondary | ICD-10-CM | POA: Diagnosis not present

## 2019-07-18 DIAGNOSIS — F4310 Post-traumatic stress disorder, unspecified: Secondary | ICD-10-CM | POA: Diagnosis not present

## 2019-07-18 DIAGNOSIS — E1169 Type 2 diabetes mellitus with other specified complication: Secondary | ICD-10-CM

## 2019-07-18 DIAGNOSIS — L97523 Non-pressure chronic ulcer of other part of left foot with necrosis of muscle: Secondary | ICD-10-CM | POA: Diagnosis not present

## 2019-07-18 DIAGNOSIS — E1165 Type 2 diabetes mellitus with hyperglycemia: Secondary | ICD-10-CM | POA: Diagnosis not present

## 2019-07-18 NOTE — Progress Notes (Signed)
Office Visit Note   Patient: Cody Webster           Date of Birth: 1966-04-16           MRN: [id number] Visit Date: 07/18/2019              Requested by: Kerin Perna, NP [address] [place],  [place] [address] PCP: Kerin Perna, NP  Chief Complaint  Patient presents with  . Left Foot - Pain      HPI: Patient is a 54 year old gentleman who was seen in follow-up for Wagner grade 1 ulcer plantar aspect left foot as well as painful onychomycotic nails.  Patient states that he has not had any drainage or cellulitis but has a painful ulcer beneath the fifth metatarsal head left foot.  Assessment & Plan: Visit Diagnoses:  1. 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 Onychomycosis     Plan: Ulcer was debrided of skin and soft tissue nails were trimmed x5 continue with protective shoe wear reevaluate in 3 months.  Follow-Up Instructions: Return in about 3 months (around 10/18/2019).   Ortho Exam  Patient is alert, oriented, no adenopathy, well-dressed, normal affect, normal respiratory effort. Examination patient has a slight equinus contracture on the left with dorsiflexion about 10 degrees short of neutral.  Recommend Achilles stretching to unload the forefoot.  He has thickened discolored onychomycotic nails x5 he is unable to safely trim the nails on his own and nails were trimmed x5 without complications.  He has a Wagner grade 1 ulcer beneath the fifth metatarsal head.  After informed consent a 10 blade knife was used to debride the skin and soft tissue back to healthy viable granulation tissue.  The ulcer is 2 cm in diameter 5 mm deep.  A Band-Aid was applied.  Imaging: No results found. No images are attached to the encounter.  Labs: Lab Results  Component Value Date   HGBA1C 5.1 07/20/2017   HGBA1C 6.7 (H) 04/23/2016   HGBA1C 10.7 (H) 09/06/2014   ESRSEDRATE 25 (H) 12/10/2013     CRP <0.5 (L) 12/10/2013   REPTSTATUS 04/30/2016 FINAL 04/24/2016   REPTSTATUS 04/30/2016 FINAL 04/24/2016   CULT  04/24/2016    NO GROWTH 5 DAYS Performed at [facility]  04/24/2016    NO GROWTH 5 DAYS Performed at [facility]      Lab Results  Component Value Date   ALBUMIN 4.0 08/30/2017   ALBUMIN 3.4 (L) 07/20/2017   ALBUMIN 2.6 (L) 04/27/2016    Lab Results  Component Value Date   MG 1.7 04/27/2016   MG 1.9 04/26/2016   MG 1.8 04/25/2016   No results found for: VD25OH  No results found for: PREALBUMIN CBC EXTENDED Latest Ref Rng & Units 01/03/2018 12/20/2017 11/27/2017  WBC 4.0 - 10.5 K/uL - - -  RBC 4.22 - 5.81 MIL/uL - - -  HGB 13.0 - 17.0 g/dL 10.8(L) 9.9(L) 10.8(L)  HCT 39.0 - 52.0 % - - -  PLT 150 - 400 K/uL - - -  NEUTROABS 1.7 - 7.7 K/uL - - -  LYMPHSABS 0.7 - 4.0 K/uL - - -     There is no height or weight on file to calculate BMI.  Orders:  No orders of the defined types were placed in this encounter.  No orders of the defined types were placed in  this encounter.    Procedures: No procedures performed  Clinical Data: No additional findings.  ROS:  All other systems negative, except as noted in the HPI. Review of Systems  Objective: Vital Signs: There were no vitals taken for this visit.  Specialty Comments:  No specialty comments available.  PMFS History: Patient Active Problem List   Diagnosis Date Noted  . Chest pain 07/20/2017  . Preoperative cardiovascular examination 11/04/2016  . Malnutrition of moderate degree ([place]) 01/12/2015  . DM type 2, uncontrolled, with neuropathy ([place]) 01/11/2015  . Glaucoma   . Eye pain   . Acute glaucoma of right eye 09/06/2014  . Acute renal failure superimposed on chronic kidney disease ([place]) 09/06/2014  . Anemia 09/06/2014  . Tobacco use disorder 09/06/2014  . Frequent headaches 07/11/2014  . CKD (chronic kidney disease), stage IV ([place]) 07/11/2014  . Hypertensive  heart disease with diastolic heart failure and stage 5 chronic kidney disease, not on chronic dialysis ([place]) 07/11/2014  . Ataxia 07/11/2014  . CKD (chronic kidney disease) 12/11/2013  . Diabetic foot ulcer ([place]) 12/10/2013  . Essential hypertension 12/10/2013  . Peripheral neuropathy ([place]) 12/10/2013   Past Medical History:  Diagnosis Date  . Carotid artery occlusion   . 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 Diabetes mellitus without complication ([place])    type 2  . GERD (gastroesophageal reflux disease)   . Hypertension   . 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 Peripheral neuropathy   . Pneumonia   . Seizures ([place])    27 years ago    Family History  Problem Relation Age of Onset  . CAD Mother   . Diabetes type II Father   . Hypertension Sister   . CAD Sister   . Diabetes type II Brother     Past Surgical History:  Procedure Laterality Date  . AV FISTULA PLACEMENT Left 11/08/2016   Procedure: ARTERIOVENOUS (AV) FISTULA CREATION;  Surgeon: Angelia Mould, MD;  Location: [facility];  Service: Vascular;  Laterality: Left;  . NM MYOVIEW LTD  07/2016   "Moderate sized moderate severity" (on cardiology was very small size, small intensity) partially reversible inferoapical//inferoseptal defect.  (Read as intermediate-high risk) -> over read by cardiology as LOW RISK  . right foot surgery    . TOE AMPUTATION    . TRANSTHORACIC ECHOCARDIOGRAM  07/2016   EF 60%.,  Moderate LVH.  GR 1 DD.  No R WMA.  Mild RV and moderate RA dilation.   Social History   Occupational History    Comment: Disabled - for Back pain & foot ulcer  Tobacco Use  . Smoking status: Current Every Day Smoker    Packs/day: 1.00    Years: 30.00    Pack years: 30.00    Types: Cigarettes, Cigars  . Smokeless tobacco: Former Systems developer    Types: Snuff, Sarina Ser    Quit date:  12/11/1983  Substance and Sexual Activity  . Alcohol use: Yes    Comment: ocassaionlly   . Drug use: No  . Sexual activity: Not Currently

## 2019-07-19 DIAGNOSIS — E1165 Type 2 diabetes mellitus with hyperglycemia: Secondary | ICD-10-CM | POA: Diagnosis not present

## 2019-07-19 DIAGNOSIS — M545 Low back pain: Secondary | ICD-10-CM | POA: Diagnosis not present

## 2019-07-19 DIAGNOSIS — N189 Chronic kidney disease, unspecified: Secondary | ICD-10-CM | POA: Diagnosis not present

## 2019-07-19 DIAGNOSIS — F4310 Post-traumatic stress disorder, unspecified: Secondary | ICD-10-CM | POA: Diagnosis not present

## 2019-07-19 DIAGNOSIS — I10 Essential (primary) hypertension: Secondary | ICD-10-CM | POA: Diagnosis not present

## 2019-07-20 DIAGNOSIS — N189 Chronic kidney disease, unspecified: Secondary | ICD-10-CM | POA: Diagnosis not present

## 2019-07-20 DIAGNOSIS — M545 Low back pain: Secondary | ICD-10-CM | POA: Diagnosis not present

## 2019-07-20 DIAGNOSIS — I10 Essential (primary) hypertension: Secondary | ICD-10-CM | POA: Diagnosis not present

## 2019-07-20 DIAGNOSIS — F4310 Post-traumatic stress disorder, unspecified: Secondary | ICD-10-CM | POA: Diagnosis not present

## 2019-07-20 DIAGNOSIS — E1165 Type 2 diabetes mellitus with hyperglycemia: Secondary | ICD-10-CM | POA: Diagnosis not present

## 2019-07-22 DIAGNOSIS — F4310 Post-traumatic stress disorder, unspecified: Secondary | ICD-10-CM | POA: Diagnosis not present

## 2019-07-22 DIAGNOSIS — I10 Essential (primary) hypertension: Secondary | ICD-10-CM | POA: Diagnosis not present

## 2019-07-22 DIAGNOSIS — E1165 Type 2 diabetes mellitus with hyperglycemia: Secondary | ICD-10-CM | POA: Diagnosis not present

## 2019-07-22 DIAGNOSIS — N189 Chronic kidney disease, unspecified: Secondary | ICD-10-CM | POA: Diagnosis not present

## 2019-07-22 DIAGNOSIS — M545 Low back pain: Secondary | ICD-10-CM | POA: Diagnosis not present

## 2019-07-23 DIAGNOSIS — E1165 Type 2 diabetes mellitus with hyperglycemia: Secondary | ICD-10-CM | POA: Diagnosis not present

## 2019-07-23 DIAGNOSIS — N189 Chronic kidney disease, unspecified: Secondary | ICD-10-CM | POA: Diagnosis not present

## 2019-07-23 DIAGNOSIS — F4310 Post-traumatic stress disorder, unspecified: Secondary | ICD-10-CM | POA: Diagnosis not present

## 2019-07-23 DIAGNOSIS — M545 Low back pain: Secondary | ICD-10-CM | POA: Diagnosis not present

## 2019-07-23 DIAGNOSIS — I10 Essential (primary) hypertension: Secondary | ICD-10-CM | POA: Diagnosis not present

## 2019-07-24 DIAGNOSIS — N189 Chronic kidney disease, unspecified: Secondary | ICD-10-CM | POA: Diagnosis not present

## 2019-07-24 DIAGNOSIS — E1165 Type 2 diabetes mellitus with hyperglycemia: Secondary | ICD-10-CM | POA: Diagnosis not present

## 2019-07-24 DIAGNOSIS — F4310 Post-traumatic stress disorder, unspecified: Secondary | ICD-10-CM | POA: Diagnosis not present

## 2019-07-24 DIAGNOSIS — M545 Low back pain: Secondary | ICD-10-CM | POA: Diagnosis not present

## 2019-07-24 DIAGNOSIS — I10 Essential (primary) hypertension: Secondary | ICD-10-CM | POA: Diagnosis not present

## 2019-07-25 DIAGNOSIS — M79605 Pain in left leg: Secondary | ICD-10-CM | POA: Diagnosis not present

## 2019-07-25 DIAGNOSIS — M545 Low back pain: Secondary | ICD-10-CM | POA: Diagnosis not present

## 2019-07-25 DIAGNOSIS — F4310 Post-traumatic stress disorder, unspecified: Secondary | ICD-10-CM | POA: Diagnosis not present

## 2019-07-25 DIAGNOSIS — I10 Essential (primary) hypertension: Secondary | ICD-10-CM | POA: Diagnosis not present

## 2019-07-25 DIAGNOSIS — M25571 Pain in right ankle and joints of right foot: Secondary | ICD-10-CM | POA: Diagnosis not present

## 2019-07-25 DIAGNOSIS — N189 Chronic kidney disease, unspecified: Secondary | ICD-10-CM | POA: Diagnosis not present

## 2019-07-25 DIAGNOSIS — M79604 Pain in right leg: Secondary | ICD-10-CM | POA: Diagnosis not present

## 2019-07-25 DIAGNOSIS — E1165 Type 2 diabetes mellitus with hyperglycemia: Secondary | ICD-10-CM | POA: Diagnosis not present

## 2019-07-26 DIAGNOSIS — N189 Chronic kidney disease, unspecified: Secondary | ICD-10-CM | POA: Diagnosis not present

## 2019-07-26 DIAGNOSIS — M545 Low back pain: Secondary | ICD-10-CM | POA: Diagnosis not present

## 2019-07-26 DIAGNOSIS — F4310 Post-traumatic stress disorder, unspecified: Secondary | ICD-10-CM | POA: Diagnosis not present

## 2019-07-26 DIAGNOSIS — E1165 Type 2 diabetes mellitus with hyperglycemia: Secondary | ICD-10-CM | POA: Diagnosis not present

## 2019-07-26 DIAGNOSIS — I10 Essential (primary) hypertension: Secondary | ICD-10-CM | POA: Diagnosis not present

## 2019-07-27 DIAGNOSIS — F4310 Post-traumatic stress disorder, unspecified: Secondary | ICD-10-CM | POA: Diagnosis not present

## 2019-07-27 DIAGNOSIS — M545 Low back pain: Secondary | ICD-10-CM | POA: Diagnosis not present

## 2019-07-27 DIAGNOSIS — N189 Chronic kidney disease, unspecified: Secondary | ICD-10-CM | POA: Diagnosis not present

## 2019-07-27 DIAGNOSIS — E1165 Type 2 diabetes mellitus with hyperglycemia: Secondary | ICD-10-CM | POA: Diagnosis not present

## 2019-07-27 DIAGNOSIS — I10 Essential (primary) hypertension: Secondary | ICD-10-CM | POA: Diagnosis not present

## 2019-07-29 DIAGNOSIS — M545 Low back pain: Secondary | ICD-10-CM | POA: Diagnosis not present

## 2019-07-29 DIAGNOSIS — I10 Essential (primary) hypertension: Secondary | ICD-10-CM | POA: Diagnosis not present

## 2019-07-29 DIAGNOSIS — F4310 Post-traumatic stress disorder, unspecified: Secondary | ICD-10-CM | POA: Diagnosis not present

## 2019-07-29 DIAGNOSIS — N189 Chronic kidney disease, unspecified: Secondary | ICD-10-CM | POA: Diagnosis not present

## 2019-07-29 DIAGNOSIS — E1165 Type 2 diabetes mellitus with hyperglycemia: Secondary | ICD-10-CM | POA: Diagnosis not present

## 2019-07-30 DIAGNOSIS — E1165 Type 2 diabetes mellitus with hyperglycemia: Secondary | ICD-10-CM | POA: Diagnosis not present

## 2019-07-30 DIAGNOSIS — I10 Essential (primary) hypertension: Secondary | ICD-10-CM | POA: Diagnosis not present

## 2019-07-30 DIAGNOSIS — F4310 Post-traumatic stress disorder, unspecified: Secondary | ICD-10-CM | POA: Diagnosis not present

## 2019-07-30 DIAGNOSIS — M545 Low back pain: Secondary | ICD-10-CM | POA: Diagnosis not present

## 2019-07-30 DIAGNOSIS — N189 Chronic kidney disease, unspecified: Secondary | ICD-10-CM | POA: Diagnosis not present

## 2019-07-31 DIAGNOSIS — N189 Chronic kidney disease, unspecified: Secondary | ICD-10-CM | POA: Diagnosis not present

## 2019-07-31 DIAGNOSIS — E1165 Type 2 diabetes mellitus with hyperglycemia: Secondary | ICD-10-CM | POA: Diagnosis not present

## 2019-07-31 DIAGNOSIS — M545 Low back pain: Secondary | ICD-10-CM | POA: Diagnosis not present

## 2019-07-31 DIAGNOSIS — I10 Essential (primary) hypertension: Secondary | ICD-10-CM | POA: Diagnosis not present

## 2019-07-31 DIAGNOSIS — F4310 Post-traumatic stress disorder, unspecified: Secondary | ICD-10-CM | POA: Diagnosis not present

## 2019-08-01 DIAGNOSIS — M545 Low back pain: Secondary | ICD-10-CM | POA: Diagnosis not present

## 2019-08-01 DIAGNOSIS — F4310 Post-traumatic stress disorder, unspecified: Secondary | ICD-10-CM | POA: Diagnosis not present

## 2019-08-01 DIAGNOSIS — I10 Essential (primary) hypertension: Secondary | ICD-10-CM | POA: Diagnosis not present

## 2019-08-01 DIAGNOSIS — N189 Chronic kidney disease, unspecified: Secondary | ICD-10-CM | POA: Diagnosis not present

## 2019-08-01 DIAGNOSIS — E1165 Type 2 diabetes mellitus with hyperglycemia: Secondary | ICD-10-CM | POA: Diagnosis not present

## 2019-08-02 DIAGNOSIS — I10 Essential (primary) hypertension: Secondary | ICD-10-CM | POA: Diagnosis not present

## 2019-08-02 DIAGNOSIS — N189 Chronic kidney disease, unspecified: Secondary | ICD-10-CM | POA: Diagnosis not present

## 2019-08-02 DIAGNOSIS — E1165 Type 2 diabetes mellitus with hyperglycemia: Secondary | ICD-10-CM | POA: Diagnosis not present

## 2019-08-02 DIAGNOSIS — M545 Low back pain: Secondary | ICD-10-CM | POA: Diagnosis not present

## 2019-08-02 DIAGNOSIS — F4310 Post-traumatic stress disorder, unspecified: Secondary | ICD-10-CM | POA: Diagnosis not present

## 2019-08-21 DIAGNOSIS — N2581 Secondary hyperparathyroidism of renal origin: Secondary | ICD-10-CM | POA: Diagnosis not present

## 2019-08-21 DIAGNOSIS — N185 Chronic kidney disease, stage 5: Secondary | ICD-10-CM | POA: Diagnosis not present

## 2019-08-22 DIAGNOSIS — M79605 Pain in left leg: Secondary | ICD-10-CM | POA: Diagnosis not present

## 2019-08-22 DIAGNOSIS — M79604 Pain in right leg: Secondary | ICD-10-CM | POA: Diagnosis not present

## 2019-08-22 DIAGNOSIS — M25571 Pain in right ankle and joints of right foot: Secondary | ICD-10-CM | POA: Diagnosis not present

## 2019-08-22 DIAGNOSIS — M545 Low back pain: Secondary | ICD-10-CM | POA: Diagnosis not present

## 2019-09-02 DIAGNOSIS — Z23 Encounter for immunization: Secondary | ICD-10-CM | POA: Diagnosis not present

## 2019-09-06 DIAGNOSIS — Z1211 Encounter for screening for malignant neoplasm of colon: Secondary | ICD-10-CM | POA: Diagnosis not present

## 2019-09-06 DIAGNOSIS — J309 Allergic rhinitis, unspecified: Secondary | ICD-10-CM | POA: Diagnosis not present

## 2019-09-06 DIAGNOSIS — I10 Essential (primary) hypertension: Secondary | ICD-10-CM | POA: Diagnosis not present

## 2019-09-06 DIAGNOSIS — E119 Type 2 diabetes mellitus without complications: Secondary | ICD-10-CM | POA: Diagnosis not present

## 2019-09-06 DIAGNOSIS — N186 End stage renal disease: Secondary | ICD-10-CM | POA: Diagnosis not present

## 2019-09-14 HISTORY — PX: NM MYOVIEW LTD: HXRAD82

## 2019-09-19 DIAGNOSIS — M545 Low back pain: Secondary | ICD-10-CM | POA: Diagnosis not present

## 2019-09-19 DIAGNOSIS — M25571 Pain in right ankle and joints of right foot: Secondary | ICD-10-CM | POA: Diagnosis not present

## 2019-09-19 DIAGNOSIS — M79604 Pain in right leg: Secondary | ICD-10-CM | POA: Diagnosis not present

## 2019-09-19 DIAGNOSIS — M79605 Pain in left leg: Secondary | ICD-10-CM | POA: Diagnosis not present

## 2019-09-25 ENCOUNTER — Other Ambulatory Visit: Payer: Self-pay

## 2019-09-25 ENCOUNTER — Encounter (INDEPENDENT_AMBULATORY_CARE_PROVIDER_SITE_OTHER): Payer: Self-pay | Admitting: Primary Care

## 2019-09-25 ENCOUNTER — Ambulatory Visit (INDEPENDENT_AMBULATORY_CARE_PROVIDER_SITE_OTHER): Payer: Medicaid Other | Admitting: Primary Care

## 2019-09-25 VITALS — BP 198/118 | HR 74 | Temp 97.2°F | Resp 16 | Wt 201.4 lb

## 2019-09-25 DIAGNOSIS — N185 Chronic kidney disease, stage 5: Secondary | ICD-10-CM | POA: Diagnosis not present

## 2019-09-25 DIAGNOSIS — Z1211 Encounter for screening for malignant neoplasm of colon: Secondary | ICD-10-CM | POA: Diagnosis not present

## 2019-09-25 DIAGNOSIS — E119 Type 2 diabetes mellitus without complications: Secondary | ICD-10-CM

## 2019-09-25 DIAGNOSIS — F4310 Post-traumatic stress disorder, unspecified: Secondary | ICD-10-CM

## 2019-09-25 DIAGNOSIS — Z0000 Encounter for general adult medical examination without abnormal findings: Secondary | ICD-10-CM

## 2019-09-25 DIAGNOSIS — I10 Essential (primary) hypertension: Secondary | ICD-10-CM

## 2019-09-25 DIAGNOSIS — F17200 Nicotine dependence, unspecified, uncomplicated: Secondary | ICD-10-CM

## 2019-09-25 LAB — POCT GLYCOSYLATED HEMOGLOBIN (HGB A1C): HbA1c POC (<> result, manual entry): 4.7 % (ref 4.0–5.6)

## 2019-09-25 LAB — GLUCOSE, POCT (MANUAL RESULT ENTRY): POC Glucose: 80 mg/dl (ref 70–99)

## 2019-09-25 NOTE — Patient Instructions (Signed)
Chronic Kidney Disease, Adult Chronic kidney disease (CKD) occurs when the kidneys become damaged slowly over a long period of time. The kidneys are a pair of organs that do many important jobs in the body, including:  Removing waste and extra fluid from the blood to make urine.  Making hormones that maintain the amount of fluid in tissues and blood vessels.  Maintaining the right amount of fluids and chemicals in the body. A small amount of kidney damage may not cause problems, but a large amount of damage may make it hard or impossible for the kidneys to work the way they should. If steps are not taken to slow down kidney damage or to stop it from getting worse, the kidneys may stop working permanently (end-stage renal disease or ESRD). Most of the time, CKD does not go away, but it can often be controlled. People who have CKD are usually able to live normal lives. What are the causes? The most common causes of this condition are diabetes and high blood pressure (hypertension). Other causes include:  Heart and blood vessel (cardiovascular) disease.  Kidney diseases, such as: ? Glomerulonephritis. ? Interstitial nephritis. ? Polycystic kidney disease. ? Renal vascular disease.  Diseases that affect the immune system.  Genetic diseases.  Medicines that damage the kidneys, such as anti-inflammatory medicines.  Being around or being in contact with poisonous (toxic) substances.  A kidney or urinary infection that occurs again and again (recurs).  Vasculitis. This is swelling or inflammation of the blood vessels.  A problem with urine flow that may be caused by: ? Cancer. ? Having kidney stones more than one time. ? An enlarged prostate, in males. What increases the risk? You are more likely to develop this condition if you:  Are older than age 60.  Are male.  Are African-American, Hispanic, Asian, Pacific Islander, or American Indian.  Are a current or former smoker.   Are obese.  Have a family history of kidney disease or failure.  Often take medicines that are damaging to the kidneys. What are the signs or symptoms? Symptoms of this condition include:  Swelling (edema) of the face, legs, ankles, or feet.  Tiredness (lethargy) and having less energy.  Nausea or vomiting.  Confusion or trouble concentrating.  Problems with urination, such as: ? Painful or burning feeling during urination. ? Decreased urine production. ? Frequent urination, especially at night. ? Bloody urine.  Muscle twitches and cramps, especially in the legs.  Shortness of breath.  Weakness.  Loss of appetite.  Metallic taste in the mouth.  Trouble sleeping.  Dry, itchy skin.  A low blood count (anemia).  Pale lining of the eyelids and surface of the eye (conjunctiva). Symptoms develop slowly and may not be obvious until the kidney damage becomes severe. It is possible to have kidney disease for years without having any symptoms. How is this diagnosed? This condition may be diagnosed based on:  Blood tests.  Urine tests.  Imaging tests, such as an ultrasound or CT scan.  A test in which a sample of tissue is removed from the kidneys to be examined under a microscope (kidney biopsy). These test results will help your health care provider determine how serious the CKD is. How is this treated? There is no cure for most cases of this condition, but treatment usually relieves symptoms and prevents or slows the progression of the disease. Treatment may include:  Making diet changes, which may require you to avoid alcohol, salty foods (sodium),   and foods that are high in potassium, calcium, and protein.  Medicines: ? To lower blood pressure. ? To control blood glucose. ? To relieve anemia. ? To relieve swelling. ? To protect your bones. ? To improve the balance of electrolytes in your blood.  Removing toxic waste from the body through types of dialysis, if  the kidneys can no longer do their job (kidney failure).  Managing any other conditions that are causing your CKD or making it worse. Follow these instructions at home: Medicines  Take over-the-counter and prescription medicines only as told by your health care provider. The dose of some medicines that you take may need to be adjusted.  Do not take any new medicines unless approved by your health care provider. Many medicines can worsen your kidney damage.  Do not take any vitamin and mineral supplements unless approved by your health care provider. Many nutritional supplements can worsen your kidney damage. General instructions  Follow your prescribed diet as told by your health care provider.  Do not use any products that contain nicotine or tobacco, such as cigarettes and e-cigarettes. If you need help quitting, ask your health care provider.  Monitor and track your blood pressure at home. Report changes in your blood pressure as told by your health care provider.  If you are being treated for diabetes, monitor and track your blood sugar (blood glucose) levels as told by your health care provider.  Maintain a healthy weight. If you need help with this, ask your health care provider.  Start or continue an exercise plan. Exercise at least 30 minutes a day, 5 days a week.  Keep your immunizations up to date as told by your health care provider.  Keep all follow-up visits as told by your health care provider. This is important. Where to find more information  American Association of Kidney Patients: www.aakp.org  National Kidney Foundation: www.kidney.org  American Kidney Fund: www.akfinc.org  [facility]: www.lifeoptions.org and www.kidneyschool.org Contact a health care provider if:  Your symptoms get worse.  You develop new symptoms. Get help right away if:  You develop symptoms of ESRD, which include: ? Headaches. ? Numbness in the hands or  feet. ? Easy bruising. ? Frequent hiccups. ? Chest pain. ? Shortness of breath. ? Lack of menstruation, in women.  You have a fever.  You have decreased urine production.  You have pain or bleeding when you urinate. Summary  Chronic kidney disease (CKD) occurs when the kidneys become damaged slowly over a long period of time.  The most common causes of this condition are diabetes and high blood pressure (hypertension).  There is no cure for most cases of this condition, but treatment usually relieves symptoms and prevents or slows the progression of the disease. Treatment may include a combination of medicines and lifestyle changes. This information is not intended to replace advice given to you by your health care provider. Make sure you discuss any questions you have with your health care provider. Document Revised: 04/14/2017 Document Reviewed: 06/09/2016 Elsevier Patient Education  2020 Elsevier Inc.  

## 2019-09-25 NOTE — Progress Notes (Signed)
New Patient Office Visit  Subjective:  Patient ID: Cody Webster, male    DOB: May 05, 1966  Age: 54 y.o. MRN: [id number]  CC:  Chief Complaint  Patient presents with  . Hypertension    HPI [name] presents follow up. He has chronic medical conditions to include Chronic kidney disease stage 5 he is waiting for a kidney transplant from [facility]. Controlled type 2 diabetic A1C 4.7. Blood pressure is elevated today - he forgot to take his medications.  Denies shortness of breath, headaches, chest pain or lower extremity edema, sudden onset, vision changes ( left eye blind), unilateral weakness, dizziness, paresthesias  Past Medical History:  Diagnosis Date  . Carotid artery occlusion   . 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 Diabetes mellitus without complication ([place])    type 2  . GERD (gastroesophageal reflux disease)   . Hypertension   . 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 Peripheral neuropathy   . Pneumonia   . Seizures ([place])    27 years ago    Past Surgical History:  Procedure Laterality Date  . AV FISTULA PLACEMENT Left 11/08/2016   Procedure: ARTERIOVENOUS (AV) FISTULA CREATION;  Surgeon: Angelia Mould, MD;  Location: [facility];  Service: Vascular;  Laterality: Left;  . NM MYOVIEW LTD  07/2016   "Moderate sized moderate severity" (on cardiology was very small size, small intensity) partially reversible inferoapical//inferoseptal defect.  (Read as intermediate-high risk) -> over read by cardiology as LOW RISK  . right foot surgery    . TOE AMPUTATION    . TRANSTHORACIC ECHOCARDIOGRAM  07/2016   EF 60%.,  Moderate LVH.  GR 1 DD.  No R WMA.  Mild RV and moderate RA dilation.    Family History  Problem Relation Age of Onset  . CAD Mother   . Diabetes type II Father   . Hypertension Sister   . CAD Sister   . Diabetes  type II Brother     Social History   Socioeconomic History  . Marital status: Single    Spouse name: Not on file  . Number of children: 1  . Years of education: Not on file  . Highest education level: Not on file  Occupational History    Comment: Disabled - for Back pain & foot ulcer  Tobacco Use  . Smoking status: Current Every Day Smoker    Packs/day: 1.00    Years: 30.00    Pack years: 30.00    Types: Cigarettes, Cigars  . Smokeless tobacco: Former Systems developer    Types: Snuff, Sarina Ser    Quit date: 12/11/1983  Substance and Sexual Activity  . Alcohol use: Yes    Comment: ocassaionlly   . Drug use: No  . Sexual activity: Not Currently  Other Topics Concern  . Not on file  Social History Narrative  . Not on file   Social Determinants of Health   Financial Resource Strain:   . Difficulty of Paying Living Expenses:   Food Insecurity:   . Worried About Charity fundraiser in the Last Year:   . Arboriculturist in the Last Year:   Transportation Needs:   . Film/video editor (Medical):   Marland Kitchen Lack of Transportation (Non-Medical):   Physical Activity:   . Days of Exercise per Week:   .  Minutes of Exercise per Session:   Stress:   . Feeling of Stress :   Social Connections:   . Frequency of Communication with Friends and Family:   . Frequency of Social Gatherings with Friends and Family:   . Attends Religious Services:   . Active Member of Clubs or Organizations:   . Attends Archivist Meetings:   Marland Kitchen Marital Status:   Intimate Partner Violence:   . Fear of Current or Ex-Partner:   . Emotionally Abused:   Marland Kitchen Physically Abused:   . Sexually Abused:     ROS Review of Systems  Constitutional: Positive for appetite change.       Decrease appetite   HENT:       No teeth  Eyes:       Right eye blind  Allergic/Immunologic: Positive for environmental allergies.  Psychiatric/Behavioral: Positive for sleep disturbance. The patient is nervous/anxious.   All other  systems reviewed and are negative.   Objective:   Today's Vitals: BP (!) 198/118   Pulse 74   Temp (!) 97.2 F (36.2 C)   Resp 16   Wt 201 lb 6.4 oz (91.4 kg)   SpO2 100%   BMI 26.57 kg/m   Physical Exam Vitals reviewed.  Constitutional:      Appearance: Normal appearance. He is normal weight.  HENT:     Head: Normocephalic.     Right Ear: Tympanic membrane normal.     Left Ear: Tympanic membrane normal.     Nose: Nose normal.  Eyes:     Extraocular Movements: Extraocular movements intact.     Comments: right eye blind  Cardiovascular:     Rate and Rhythm: Normal rate and regular rhythm.     Pulses: Normal pulses.     Heart sounds: Normal heart sounds.  Pulmonary:     Effort: Pulmonary effort is normal.     Breath sounds: Normal breath sounds.  Abdominal:     General: Bowel sounds are normal.  Musculoskeletal:        General: Normal range of motion.     Cervical back: Normal range of motion and neck supple.     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  Neurological:     Mental Status: He is alert.     Assessment & Plan:  Cody Webster was seen today for hypertension.  Diagnoses and all orders for this visit:  Type 2 diabetes mellitus without complication, without long-term current use of insulin ([place]) Patient has stopped taking diabetic medication, -     POCT glucose (manual entry) -     POCT glycosylated hemoglobin (Hb A1C) 4.7   Colon cancer screening Refer to GI for colonoscopy. Normal colon cancer screening.  CDC recommends colorectal screening from ages 42-75.  Essential hypertension Uncontrolled also underlying CKD stage iV . Bp medication was not taken discussed compliance. Asked to go home and take his morning Bp medications and take Bp and call with readings.   Stage 5 chronic kidney disease not on chronic dialysis [facility]) Patient has a schedule appointment with [facility] for follow up on kidney  transplant plans.  Discussed he has to stop smoking.   PTSD (post-traumatic stress disorder) Controlled on Wellbutrin  triggered by a terrifying event - he has symptoms that  include flashbacks, nightmares and severe anxiety,  Depression screen [facility] 2/9 09/25/2019 07/16/2019  Decreased Interest 2 0  Down, Depressed, Hopeless 1 0  PHQ - 2 Score 3 0  Altered sleeping 2 -  Tired, decreased energy 2 -  Change in appetite 2 -  Feeling bad or failure about yourself  1 -  Trouble concentrating 0 -  Moving slowly or fidgety/restless 0 -  Suicidal thoughts 0 -  PHQ-9 Score 10 -   Tobacco use disorder He is aware that the nicotine affect every organ in the body and with CKD this needs to stopped before transplant.  Increased risk for  respiratory diseases recommend cessation.  This will be reminded at each clinical visit.  Annual physical exam Completed- blood work done by [facility]    Outpatient Encounter Medications as of 09/25/2019  Medication Sig  . acetaminophen (TYLENOL) 500 MG tablet Take 1,000 mg by mouth every 6 (six) hours as needed (for pain/headaches.).  Marland Kitchen aspirin EC 81 MG tablet Take 81 mg by mouth daily.  Marland Kitchen atorvastatin (LIPITOR) 80 MG tablet Take 1 tablet (80 mg total) by mouth daily at 6 PM. (Patient not taking: Reported on 07/16/2019)  . buPROPion (WELLBUTRIN XL) 150 MG 24 hr tablet Take 1 tablet (150 mg total) by mouth daily.  . carvedilol (COREG) 25 MG tablet Take 1 tablet (25 mg total) by mouth 2 (two) times daily with a meal. Reported on 05/27/2015  . cetirizine (ZYRTEC) 10 MG tablet Take 10 mg by mouth daily.  . cloNIDine (CATAPRES) 0.1 MG tablet Take 1 tablet (0.1 mg total) by mouth 3 (three) times daily.  Marland Kitchen gabapentin (NEURONTIN) 300 MG capsule TAKE 1 CAPSULE (300 MG TOTAL) BY MOUTH 2 (TWO) TIMES DAILY.  . hydrALAZINE (APRESOLINE) 50 MG tablet TAKE 2 TABLETS (100 MG TOTAL) BY MOUTH 3 (THREE) TIMES DAILY.  . nitroGLYCERIN (NITROSTAT) 0.4 MG SL tablet Place 1 tablet (0.4 mg total) under the  tongue every 5 (five) minutes as needed for chest pain (CP or SOB).  Marland Kitchen oxyCODONE-acetaminophen (PERCOCET) 10-325 MG tablet Take 1 tablet by mouth every 6 (six) hours as needed. For pain.  . sitaGLIPtin (JANUVIA) 25 MG tablet Take 1 tablet (25 mg total) by mouth daily. (Patient not taking: Reported on 07/16/2019)  . sitaGLIPtin (JANUVIA) 50 MG tablet Take by mouth.  . traZODone (DESYREL) 50 MG tablet Take 50 mg by mouth at bedtime.   No facility-administered encounter medications on file as of 09/25/2019.    Follow-up: Return in about 4 weeks (around 10/23/2019) for follow up.   Kerin Perna, NP

## 2019-09-27 ENCOUNTER — Telehealth (INDEPENDENT_AMBULATORY_CARE_PROVIDER_SITE_OTHER): Payer: Self-pay

## 2019-09-27 NOTE — Telephone Encounter (Signed)
Patient called to make a medication refill for   cetirizine (ZYRTEC) 10 MG tablet [[id number] fluticasone (FLONASE) 50 MCG/ACT nasal spray 1 spray     Patient uses  [place], [place] [facility]   Please advice  [phone]

## 2019-10-01 ENCOUNTER — Other Ambulatory Visit (INDEPENDENT_AMBULATORY_CARE_PROVIDER_SITE_OTHER): Payer: Self-pay | Admitting: Primary Care

## 2019-10-01 MED ORDER — CETIRIZINE HCL 10 MG PO TABS: 10.0000 mg | ORAL_TABLET | Freq: Every day | ORAL | 6 refills | Status: DC

## 2019-10-01 MED ORDER — FLUTICASONE PROPIONATE 50 MCG/ACT NA SUSP: 2.0000 | Freq: Every day | NASAL | 6 refills | Status: DC

## 2019-10-03 ENCOUNTER — Emergency Department (HOSPITAL_COMMUNITY): Payer: Medicaid Other

## 2019-10-03 ENCOUNTER — Other Ambulatory Visit: Payer: Self-pay

## 2019-10-03 ENCOUNTER — Encounter (HOSPITAL_COMMUNITY): Payer: Self-pay

## 2019-10-03 ENCOUNTER — Emergency Department (HOSPITAL_COMMUNITY)
Admission: EM | Admit: 2019-10-03 | Discharge: 2019-10-04 | Disposition: A | Discharge status: Home / Self Care | Payer: Medicaid Other | Attending: Emergency Medicine | Admitting: Emergency Medicine

## 2019-10-03 DIAGNOSIS — R0602 Shortness of breath: Secondary | ICD-10-CM | POA: Insufficient documentation

## 2019-10-03 DIAGNOSIS — I10 Essential (primary) hypertension: Secondary | ICD-10-CM

## 2019-10-03 DIAGNOSIS — F17210 Nicotine dependence, cigarettes, uncomplicated: Secondary | ICD-10-CM | POA: Insufficient documentation

## 2019-10-03 DIAGNOSIS — Z79899 Other long term (current) drug therapy: Secondary | ICD-10-CM | POA: Diagnosis not present

## 2019-10-03 DIAGNOSIS — Z7982 Long term (current) use of aspirin: Secondary | ICD-10-CM | POA: Diagnosis not present

## 2019-10-03 DIAGNOSIS — I120 Hypertensive chronic kidney disease with stage 5 chronic kidney disease or end stage renal disease: Secondary | ICD-10-CM | POA: Diagnosis not present

## 2019-10-03 DIAGNOSIS — I132 Hypertensive heart and chronic kidney disease with heart failure and with stage 5 chronic kidney disease, or end stage renal disease: Secondary | ICD-10-CM | POA: Insufficient documentation

## 2019-10-03 DIAGNOSIS — R05 Cough: Secondary | ICD-10-CM | POA: Diagnosis not present

## 2019-10-03 DIAGNOSIS — R079 Chest pain, unspecified: Secondary | ICD-10-CM

## 2019-10-03 DIAGNOSIS — R0789 Other chest pain: Secondary | ICD-10-CM | POA: Insufficient documentation

## 2019-10-03 DIAGNOSIS — E1122 Type 2 diabetes mellitus with diabetic chronic kidney disease: Secondary | ICD-10-CM | POA: Insufficient documentation

## 2019-10-03 DIAGNOSIS — Z89429 Acquired absence of other toe(s), unspecified side: Secondary | ICD-10-CM | POA: Diagnosis not present

## 2019-10-03 DIAGNOSIS — I5030 Unspecified diastolic (congestive) heart failure: Secondary | ICD-10-CM | POA: Diagnosis not present

## 2019-10-03 DIAGNOSIS — N186 End stage renal disease: Secondary | ICD-10-CM | POA: Insufficient documentation

## 2019-10-03 HISTORY — PX: TRANSTHORACIC ECHOCARDIOGRAM: SHX275

## 2019-10-03 LAB — BASIC METABOLIC PANEL
Anion gap: 10 (ref 5–15)
BUN: 55 mg/dL — ABNORMAL HIGH (ref 6–20)
CO2: 19 mmol/L — ABNORMAL LOW (ref 22–32)
Calcium: 8.1 mg/dL — ABNORMAL LOW (ref 8.9–10.3)
Chloride: 110 mmol/L (ref 98–111)
Creatinine, Ser: 8.04 mg/dL — ABNORMAL HIGH (ref 0.61–1.24)
GFR calc Af Amer: 8 mL/min — ABNORMAL LOW (ref >60)
GFR calc non Af Amer: 7 mL/min — ABNORMAL LOW (ref >60)
Glucose, Bld: 105 mg/dL — ABNORMAL HIGH (ref 70–99)
Potassium: 4.7 mmol/L (ref 3.5–5.1)
Sodium: 139 mmol/L (ref 135–145)

## 2019-10-03 LAB — CBC
HCT: 31.8 % — ABNORMAL LOW (ref 39.0–52.0)
Hemoglobin: 9.6 g/dL — ABNORMAL LOW (ref 13.0–17.0)
MCH: 30 pg (ref 26.0–34.0)
MCHC: 30.2 g/dL (ref 30.0–36.0)
MCV: 99.4 fL (ref 80.0–100.0)
Platelets: 184 10*3/uL (ref 150–400)
RBC: 3.2 MIL/uL — ABNORMAL LOW (ref 4.22–5.81)
RDW: 14.4 % (ref 11.5–15.5)
WBC: 4.5 10*3/uL (ref 4.0–10.5)
nRBC: 0 % (ref 0.0–0.2)

## 2019-10-03 LAB — TROPONIN I (HIGH SENSITIVITY)
Troponin I (High Sensitivity): 40 ng/L — ABNORMAL HIGH (ref <18)
Troponin I (High Sensitivity): 47 ng/L — ABNORMAL HIGH (ref <18)

## 2019-10-03 MED ORDER — SODIUM CHLORIDE 0.9% FLUSH: 3.0000 mL | Freq: Once | INTRAVENOUS | Status: DC

## 2019-10-03 NOTE — ED Triage Notes (Signed)
Pt reports mid-sternal chest pain that started around 7p. He stated that he was just laying down when it started. He denies vomiting, nausea, or diarrhea. Endorses some SOB. States that nothing makes the pain better or worse.

## 2019-10-04 MED ORDER — OXYCODONE-ACETAMINOPHEN 5-325 MG PO TABS
2.0000 | ORAL_TABLET | Freq: Once | ORAL | Status: AC
  Administered 2019-10-04: 2 via ORAL
  Filled 2019-10-04: qty 2

## 2019-10-04 NOTE — Discharge Instructions (Signed)
Take your blood pressure medicines when you get home.  Follow-up with your doctors

## 2019-10-04 NOTE — ED Provider Notes (Signed)
[facility] DEPT Provider Note   CSN: [id number] Arrival date & time: 10/03/19  1949     History Chief Complaint  Patient presents with  . Chest Pain    Cody Webster is a 54 y.o. male.  HPI Patient presents with chest pain and shortness of breath.  Began around 7 PM.  States he was laying down and began to feel shortness of breath and then some chest tightness.  States he had a little bit of a cough.  He is a smoker but states he has not been diagnosed with COPD.  Has end-stage renal disease and is pending transplant.  Not on dialysis.  States that he had a stress test yesterday.  Does have a history of hypertensive heart disease.  States he feels like this sometimes when he gets pneumonia.  No fevers.  No diaphoresis.  States he is feeling better now but does have a headache.    Past Medical History:  Diagnosis Date  . Carotid artery occlusion   . 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 Diabetes mellitus without complication ([place])    type 2  . GERD (gastroesophageal reflux disease)   . Hypertension   . 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 ([place]) 2015   EF now improved back to normal mildly reduced  . Peripheral neuropathy   . Pneumonia   . Seizures ([place])    27 years ago    Patient Active Problem List   Diagnosis Date Noted  . Chest pain 07/20/2017  . Preoperative cardiovascular examination 11/04/2016  . Malnutrition of moderate degree ([place]) 01/12/2015  . DM type 2, uncontrolled, with neuropathy ([place]) 01/11/2015  . Glaucoma   . Eye pain   . Acute glaucoma of right eye 09/06/2014  . Acute renal failure superimposed on chronic kidney disease ([place]) 09/06/2014  . Anemia 09/06/2014  . Tobacco use disorder 09/06/2014  . Frequent headaches 07/11/2014  . CKD (chronic kidney disease), stage IV ([place]) 07/11/2014  . 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  and stage 5 chronic kidney disease, not on chronic dialysis ([place]) 07/11/2014  . Ataxia 07/11/2014  . CKD (chronic kidney disease) 12/11/2013  . Diabetic foot ulcer ([place]) 12/10/2013  . Essential hypertension 12/10/2013  . Peripheral neuropathy ([place]) 12/10/2013    Past Surgical History:  Procedure Laterality Date  . AV FISTULA PLACEMENT Left 11/08/2016   Procedure: ARTERIOVENOUS (AV) FISTULA CREATION;  Surgeon: Angelia Mould, MD;  Location: [facility];  Service: Vascular;  Laterality: Left;  . NM MYOVIEW LTD  07/2016   "Moderate sized moderate severity" (on cardiology was very small size, small intensity) partially reversible inferoapical//inferoseptal defect.  (Read as intermediate-high risk) -> over read by cardiology as LOW RISK  . right foot surgery    . TOE AMPUTATION    . TRANSTHORACIC ECHOCARDIOGRAM  07/2016   EF 60%.,  Moderate LVH.  GR 1 DD.  No R WMA.  Mild RV and moderate RA dilation.       Family History  Problem Relation Age of Onset  . CAD Mother   . Diabetes type II Father   . Hypertension Sister   . CAD Sister   . Diabetes type II Brother     Social History   Tobacco Use  . Smoking status: Current Every Day Smoker    Packs/day: 1.00    Years: 30.00    Pack years:  30.00    Types: Cigarettes, Cigars  . Smokeless tobacco: Former User    Types: Snuff, Sarina Ser    Quit date: 12/11/1983  Substance Use Topics  . Alcohol use: Yes    Comment: ocassaionlly   . Drug use: No    Home Medications Prior to Admission medications   Medication Sig Start Date End Date Taking? Authorizing Provider  acetaminophen (TYLENOL) 500 MG tablet Take 1,000 mg by mouth every 6 (six) hours as needed (for pain/headaches.).    [provider]  aspirin EC 81 MG tablet Take 81 mg by mouth daily.    [provider]  atorvastatin (LIPITOR) 80 MG tablet Take 1 tablet (80 mg total) by mouth daily at 6 PM. Patient not taking: Reported on 07/16/2019 08/11/17   Almyra Deforest, PA    buPROPion (WELLBUTRIN XL) 150 MG 24 hr tablet Take 1 tablet (150 mg total) by mouth daily. 07/16/19   Kerin Perna, NP  carvedilol (COREG) 25 MG tablet Take 1 tablet (25 mg total) by mouth 2 (two) times daily with a meal. Reported on 05/27/2015 08/11/17   Almyra Deforest, PA  cetirizine (ZYRTEC) 10 MG tablet Take 1 tablet (10 mg total) by mouth daily. 10/01/19   Kerin Perna, NP  cloNIDine (CATAPRES) 0.1 MG tablet Take 1 tablet (0.1 mg total) by mouth 3 (three) times daily. 05/27/19   Leonie Man, MD  fluticasone [facility]) 50 MCG/ACT nasal spray Place 2 sprays into both nostrils daily. 10/01/19   Kerin Perna, NP  gabapentin (NEURONTIN) 300 MG capsule TAKE 1 CAPSULE (300 MG TOTAL) BY MOUTH 2 (TWO) TIMES DAILY. 01/11/18   Newt Minion, MD  hydrALAZINE (APRESOLINE) 50 MG tablet TAKE 2 TABLETS (100 MG TOTAL) BY MOUTH 3 (THREE) TIMES DAILY. 12/14/17   Leonie Man, MD  nitroGLYCERIN (NITROSTAT) 0.4 MG SL tablet Place 1 tablet (0.4 mg total) under the tongue every 5 (five) minutes as needed for chest pain (CP or SOB). 07/22/17   Florencia Reasons, MD  oxyCODONE-acetaminophen (PERCOCET) 10-325 MG tablet Take 1 tablet by mouth every 6 (six) hours as needed. For pain. 11/08/16   Rhyne, Hulen Shouts, PA-C  sitaGLIPtin (JANUVIA) 25 MG tablet Take 1 tablet (25 mg total) by mouth daily. Patient not taking: Reported on 07/16/2019 07/22/17   Florencia Reasons, MD  sitaGLIPtin (JANUVIA) 50 MG tablet Take by mouth.    [provider]  traZODone (DESYREL) 50 MG tablet Take 50 mg by mouth at bedtime. 07/03/17   [provider]    Allergies    Influenza vaccines, Morphine and related, and Pneumococcal vaccines  Review of Systems   Review of Systems  Constitutional: Negative for appetite change.  HENT: Negative for congestion.   Respiratory: Positive for shortness of breath.   Cardiovascular: Positive for chest pain.  Gastrointestinal: Negative for abdominal pain.  Musculoskeletal: Negative for back  pain.  Skin: Negative for rash.  Neurological: Negative for weakness.  Psychiatric/Behavioral: Negative for confusion.    Physical Exam Updated Vital Signs BP (!) 205/90 (BP Location: Right Arm)   Pulse 83   Temp 98.3 F (36.8 C) (Oral)   Resp 20   SpO2 97%   Physical Exam Vitals and nursing note reviewed.  HENT:     Head: Normocephalic.  Cardiovascular:     Rate and Rhythm: Normal rate and regular rhythm.  Pulmonary:     Breath sounds: No wheezing, rhonchi or rales.  Chest:     Chest wall: No  tenderness.  Abdominal:     Tenderness: There is no abdominal tenderness.  Musculoskeletal:     Cervical back: Neck supple.     Right lower leg: No edema.     Left lower leg: No edema.  Skin:    General: Skin is warm.     Capillary Refill: Capillary refill takes less than 2 seconds.  Neurological:     Mental Status: He is alert.     ED Results / Procedures / Treatments   Labs (all labs ordered are listed, but only abnormal results are displayed) Labs Reviewed  BASIC METABOLIC PANEL - Abnormal; Notable for the following components:      Result Value   CO2 19 (*)    Glucose, Bld 105 (*)    BUN 55 (*)    Creatinine, Ser 8.04 (*)    Calcium 8.1 (*)    GFR calc non Af Amer 7 (*)    GFR calc Af Amer 8 (*)    All other components within normal limits  CBC - Abnormal; Notable for the following components:   RBC 3.20 (*)    Hemoglobin 9.6 (*)    HCT 31.8 (*)    All other components within normal limits  TROPONIN I (HIGH SENSITIVITY) - Abnormal; Notable for the following components:   Troponin I (High Sensitivity) 40 (*)    All other components within normal limits  TROPONIN I (HIGH SENSITIVITY) - Abnormal; Notable for the following components:   Troponin I (High Sensitivity) 47 (*)    All other components within normal limits    EKG EKG Interpretation  Date/Time:  Thursday Oct 03 2019 20:00:05 EDT Ventricular Rate:  88 PR Interval:    QRS Duration: 107 QT  Interval:  366 QTC Calculation: 443 R Axis:   75 Text Interpretation: Sinus rhythm Probable left ventricular hypertrophy Borderline T abnormalities, lateral leads ST elevation, consider anterior injury 12 Lead; [place] No significant change since last tracing Confirmed by Davonna Belling [phone]) on 10/03/2019 10:24:25 PM   Radiology DG Chest 2 View  Result Date: 10/03/2019 CLINICAL DATA:  Chest pain EXAM: CHEST - 2 VIEW COMPARISON:  07/20/2017 FINDINGS: The heart size and mediastinal contours are within normal limits. Both lungs are clear. Aortic atherosclerosis. The visualized skeletal structures are unremarkable. IMPRESSION: No active cardiopulmonary disease. Electronically Signed   By: Donavan Foil M.D.   On: 10/03/2019 20:35    Procedures Procedures (including critical care time)  Medications Ordered in ED Medications  sodium chloride flush (NS) 0.9 % injection 3 mL (has no administration in time range)  oxyCODONE-acetaminophen (PERCOCET/ROXICET) 5-325 MG per tablet 2 tablet (has no administration in time range)    ED Course  I have reviewed the triage vital signs and the nursing notes.  Pertinent labs & imaging results that were available during my care of the patient were reviewed by me and considered in my medical decision making (see chart for details).    MDM Rules/Calculators/A&P                      Patient presents with chest pain.  Was mostly shortness of breath however.  Feeling much better now.  Still hypertensive but that even has improved.  Patient states that his blood pressure always goes up when he comes in the hospital.  He is due for blood pressure medicines tonight.  Creatinine is elevated but not all that much different from what appears to be his baseline.  Troponin  is mildly elevated at 40 and 47 with a repeat.  However patient did have a negative stress test yesterday.  Results of stress test reviewed.  Feeling better except for headache.  Does have  hypertension but may be component of whitecoat syndrome and needing his night medicines.  Will discharge home as we can take him.  Will follow up with his cardiologist and transplant service. Final Clinical Impression(s) / ED Diagnoses Final diagnoses:  Nonspecific chest pain  Hypertension, unspecified type  End stage renal disease [facility])    Rx / DC Orders ED Discharge Orders    None       Davonna Belling, MD 10/04/19 [phone]

## 2019-10-17 DIAGNOSIS — M25572 Pain in left ankle and joints of left foot: Secondary | ICD-10-CM | POA: Diagnosis not present

## 2019-10-17 DIAGNOSIS — M79604 Pain in right leg: Secondary | ICD-10-CM | POA: Diagnosis not present

## 2019-10-17 DIAGNOSIS — M79605 Pain in left leg: Secondary | ICD-10-CM | POA: Diagnosis not present

## 2019-10-17 DIAGNOSIS — M545 Low back pain: Secondary | ICD-10-CM | POA: Diagnosis not present

## 2019-10-18 ENCOUNTER — Encounter: Payer: Self-pay | Admitting: Family

## 2019-10-18 ENCOUNTER — Ambulatory Visit: Payer: Medicaid Other | Admitting: Physician Assistant

## 2019-10-18 ENCOUNTER — Other Ambulatory Visit: Payer: Self-pay

## 2019-10-18 ENCOUNTER — Ambulatory Visit (INDEPENDENT_AMBULATORY_CARE_PROVIDER_SITE_OTHER): Payer: Medicaid Other | Admitting: Family

## 2019-10-18 VITALS — Ht 73.0 in | Wt 201.4 lb

## 2019-10-18 DIAGNOSIS — L97523 Non-pressure chronic ulcer of other part of left foot with necrosis of muscle: Secondary | ICD-10-CM | POA: Diagnosis not present

## 2019-10-18 DIAGNOSIS — E10621 Type 1 diabetes mellitus with foot ulcer: Secondary | ICD-10-CM

## 2019-10-18 DIAGNOSIS — B351 Tinea unguium: Secondary | ICD-10-CM | POA: Diagnosis not present

## 2019-10-22 ENCOUNTER — Encounter: Payer: Self-pay | Admitting: Family

## 2019-10-22 NOTE — Progress Notes (Signed)
Office Visit Note   Patient: Cody Webster           Date of Birth: 05/28/1965           MRN: [id number] Visit Date: 10/18/2019              Requested by: Kerin Perna, NP [address] [place],  [place] [address] PCP: Kerin Perna, NP  Chief Complaint  Patient presents with  . Left Foot - Follow-up      HPI: Patient is a 54 year old gentleman who was seen in follow-up for Wagner grade 1 ulcer plantar aspect left foot as well as painful onychomycotic nails.    Assessment & Plan: Visit Diagnoses:  No diagnosis found.  Plan: Ulcer was debrided of skin and soft tissue. nails were trimmed x5. continue with protective shoe wear reevaluate in 6 weeks.  Follow-Up Instructions: Return in about 6 weeks (around 11/29/2019).   Ortho Exam  Patient is alert, oriented, no adenopathy, well-dressed, normal affect, normal respiratory effort. Examination patient has a slight equinus contracture on the left with dorsiflexion about 10 degrees short of neutral.  Recommend Achilles stretching to unload the forefoot.  He has thickened discolored onychomycotic nails x5 he is unable to safely trim the nails on his own and nails were trimmed x5 without complications.  He has a Wagner grade 1 ulcer beneath the fifth metatarsal head.  After informed consent a 10 blade knife was used to debride the skin and soft tissue back to healthy viable granulation tissue.  The ulcer is 2 cm in diameter 5 mm deep.  A Band-Aid was applied.  Imaging: No results found. No images are attached to the encounter.  Labs: Lab Results  Component Value Date   HGBA1C 4.7 09/25/2019   HGBA1C 5.1 07/20/2017   HGBA1C 6.7 (H) 04/23/2016   ESRSEDRATE 25 (H) 12/10/2013   CRP <0.5 (L) 12/10/2013   REPTSTATUS 04/30/2016 FINAL 04/24/2016   REPTSTATUS 04/30/2016 FINAL 04/24/2016   CULT  04/24/2016    NO GROWTH 5 DAYS Performed at [facility]  04/24/2016    NO GROWTH 5 DAYS Performed at [facility]      Lab Results  Component Value Date   ALBUMIN 4.0 08/30/2017   ALBUMIN 3.4 (L) 07/20/2017   ALBUMIN 2.6 (L) 04/27/2016    Lab Results  Component Value Date   MG 1.7 04/27/2016   MG 1.9 04/26/2016   MG 1.8 04/25/2016   No results found for: VD25OH  No results found for: PREALBUMIN CBC EXTENDED Latest Ref Rng & Units 10/03/2019 01/03/2018 12/20/2017  WBC 4.0 - 10.5 K/uL 4.5 - -  RBC 4.22 - 5.81 MIL/uL 3.20(L) - -  HGB 13.0 - 17.0 g/dL 9.6(L) 10.8(L) 9.9(L)  HCT 39.0 - 52.0 % 31.8(L) - -  PLT 150 - 400 K/uL 184 - -  NEUTROABS 1.7 - 7.7 K/uL - - -  LYMPHSABS 0.7 - 4.0 K/uL - - -     Body mass index is 26.57 kg/m.  Orders:  No orders of the defined types were placed in this encounter.  No orders of the defined types were placed in this encounter.    Procedures: No procedures performed  Clinical Data: No additional findings.  ROS:  All other systems negative, except as noted in the HPI. Review of Systems  Constitutional: Negative for chills and fever.  Skin: Positive for wound. Negative for color change.    Objective: Vital Signs:  Ht 6\' 1"  (1.854 m)   Wt 201 lb 6.4 oz (91.4 kg)   BMI 26.57 kg/m   Specialty Comments:  No specialty comments available.  PMFS History: Patient Active Problem List   Diagnosis Date Noted  . Chest pain 07/20/2017  . Preoperative cardiovascular examination 11/04/2016  . Malnutrition of moderate degree ([place]) 01/12/2015  . DM type 2, uncontrolled, with neuropathy ([place]) 01/11/2015  . Glaucoma   . Eye pain   . Acute glaucoma of right eye 09/06/2014  . Acute renal failure superimposed on chronic kidney disease ([place]) 09/06/2014  . Anemia 09/06/2014  . Tobacco use disorder 09/06/2014  . Frequent headaches 07/11/2014  . CKD (chronic kidney disease), stage IV ([place]) 07/11/2014  . 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, not on chronic dialysis ([place]) 07/11/2014  .  Ataxia 07/11/2014  . CKD (chronic kidney disease) 12/11/2013  . Diabetic foot ulcer ([place]) 12/10/2013  . Essential hypertension 12/10/2013  . Peripheral neuropathy ([place]) 12/10/2013   Past Medical History:  Diagnosis Date  . Carotid artery occlusion   . 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 Diabetes mellitus without complication ([place])    type 2  . GERD (gastroesophageal reflux disease)   . Hypertension   . 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 ([place]) 2015   EF now improved back to normal mildly reduced  . Peripheral neuropathy   . Pneumonia   . Seizures ([place])    27 years ago    Family History  Problem Relation Age of Onset  . CAD Mother   . Diabetes type II Father   . Hypertension Sister   . CAD Sister   . Diabetes type II Brother     Past Surgical History:  Procedure Laterality Date  . AV FISTULA PLACEMENT Left 11/08/2016   Procedure: ARTERIOVENOUS (AV) FISTULA CREATION;  Surgeon: Angelia Mould, MD;  Location: [facility];  Service: Vascular;  Laterality: Left;  . NM MYOVIEW LTD  07/2016   "Moderate sized moderate severity" (on cardiology was very small size, small intensity) partially reversible inferoapical//inferoseptal defect.  (Read as intermediate-high risk) -> over read by cardiology as LOW RISK  . right foot surgery    . TOE AMPUTATION    . TRANSTHORACIC ECHOCARDIOGRAM  07/2016   EF 60%.,  Moderate LVH.  GR 1 DD.  No R WMA.  Mild RV and moderate RA dilation.   Social History   Occupational History    Comment: Disabled - for Back pain & foot ulcer  Tobacco Use  . Smoking status: Current Every Day Smoker    Packs/day: 1.00    Years: 30.00    Pack years: 30.00    Types: Cigarettes, Cigars  . Smokeless tobacco: Former Systems developer    Types: Snuff, Sarina Ser    Quit date: 12/11/1983  Substance and Sexual Activity  . Alcohol use: Yes    Comment: ocassaionlly   . Drug use: No  . Sexual  activity: Not Currently

## 2019-10-28 ENCOUNTER — Ambulatory Visit (INDEPENDENT_AMBULATORY_CARE_PROVIDER_SITE_OTHER): Payer: Medicaid Other | Admitting: Primary Care

## 2019-11-05 ENCOUNTER — Telehealth (INDEPENDENT_AMBULATORY_CARE_PROVIDER_SITE_OTHER): Payer: Medicaid Other | Admitting: Primary Care

## 2019-11-05 ENCOUNTER — Other Ambulatory Visit: Payer: Self-pay

## 2019-11-05 ENCOUNTER — Encounter (INDEPENDENT_AMBULATORY_CARE_PROVIDER_SITE_OTHER): Payer: Self-pay | Admitting: Primary Care

## 2019-11-05 DIAGNOSIS — F17200 Nicotine dependence, unspecified, uncomplicated: Secondary | ICD-10-CM

## 2019-11-05 DIAGNOSIS — G894 Chronic pain syndrome: Secondary | ICD-10-CM

## 2019-11-05 DIAGNOSIS — Z0130 Encounter for examination of blood pressure without abnormal findings: Secondary | ICD-10-CM | POA: Diagnosis not present

## 2019-11-05 DIAGNOSIS — E119 Type 2 diabetes mellitus without complications: Secondary | ICD-10-CM

## 2019-11-05 DIAGNOSIS — G4733 Obstructive sleep apnea (adult) (pediatric): Secondary | ICD-10-CM

## 2019-11-05 DIAGNOSIS — F5101 Primary insomnia: Secondary | ICD-10-CM

## 2019-11-05 MED ORDER — TRAZODONE HCL 100 MG PO TABS: 100.0000 mg | ORAL_TABLET | Freq: Every evening | ORAL | 1 refills | Status: DC | PRN

## 2019-11-05 NOTE — Progress Notes (Addendum)
Virtual Visit via Telephone Note  I connected with Cody Webster on 11/05/19 at 11:10 AM EDT by telephone and verified that I am speaking with the correct person using two identifiers.   I discussed the limitations, risks, security and privacy concerns of performing an evaluation and management service by telephone and the availability of in person appointments. I also discussed with the patient that there may be a patient responsible charge related to this service. The patient expressed understanding and agreed to proceed. Patient was at home Cody Mire, NP was at [facility]  History of Present Illness: Cody Webster is a 54 year old male is having a tele visit for blood pressure follow up. He has a nurse that comes out six days a week and checks his blood pressure , however he did not write a log for review.   Pt has not taken bp today he just woke up and unable to fine his machine because his nurse has placed it somewhere in his home and she has not been in today. Denies shortness of breath, headaches, chest pain or lower extremity edema, sudden onset, vision changes, unilateral weakness, dizziness and paresthesias.   Past Medical History:  Diagnosis Date  . Carotid artery occlusion   . 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 Diabetes mellitus without complication ([place])    type 2  . GERD (gastroesophageal reflux disease)   . Hypertension   . 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 ([place]) 2015   EF now improved back to normal mildly reduced  . Peripheral neuropathy   . Pneumonia   . Seizures ([place])    27 years ago   Current Outpatient Medications on File Prior to Visit  Medication Sig Dispense Refill  . acetaminophen (TYLENOL) 500 MG tablet Take 1,000 mg by mouth every 6 (six) hours as needed (for pain/headaches.).    Marland Kitchen aspirin EC 81 MG tablet Take 81 mg by mouth daily.    Marland Kitchen  atorvastatin (LIPITOR) 80 MG tablet Take 1 tablet (80 mg total) by mouth daily at 6 PM. 90 tablet 3  . buPROPion (WELLBUTRIN XL) 150 MG 24 hr tablet Take 1 tablet (150 mg total) by mouth daily. 90 tablet 1  . carvedilol (COREG) 25 MG tablet Take 1 tablet (25 mg total) by mouth 2 (two) times daily with a meal. Reported on 05/27/2015 180 tablet 3  . cetirizine (ZYRTEC) 10 MG tablet Take 1 tablet (10 mg total) by mouth daily. 90 tablet 6  . cloNIDine (CATAPRES) 0.1 MG tablet Take 1 tablet (0.1 mg total) by mouth 3 (three) times daily. 270 tablet 3  . fluticasone (FLONASE) 50 MCG/ACT nasal spray Place 2 sprays into both nostrils daily. 16 g 6  . gabapentin (NEURONTIN) 300 MG capsule TAKE 1 CAPSULE (300 MG TOTAL) BY MOUTH 2 (TWO) TIMES DAILY. 60 capsule 3  . hydrALAZINE (APRESOLINE) 50 MG tablet TAKE 2 TABLETS (100 MG TOTAL) BY MOUTH 3 (THREE) TIMES DAILY. 180 tablet 2  . nitroGLYCERIN (NITROSTAT) 0.4 MG SL tablet Place 1 tablet (0.4 mg total) under the tongue every 5 (five) minutes as needed for chest pain (CP or SOB). 30 tablet 0  . oxyCODONE-acetaminophen (PERCOCET) 10-325 MG tablet Take 1 tablet by mouth every 6 (six) hours as needed. For pain. 8 tablet 0  . sitaGLIPtin (JANUVIA) 25 MG tablet Take 1 tablet (25 mg total) by mouth daily. [place]  tablet 0  . sitaGLIPtin (JANUVIA) 50 MG tablet Take by mouth.    . traZODone (DESYREL) 50 MG tablet Take 50 mg by mouth at bedtime.  3   No current facility-administered medications on file prior to visit.   Observations/Objective: Review of Systems  Musculoskeletal:       Bilateral hand pain problems with grasping and picking up objects   Neurological: Positive for tingling.       Hands  Psychiatric/Behavioral: The patient has insomnia.   All other systems reviewed and are negative.   Assessment and Plan: Janes was seen today for hypertension.  Diagnoses and all orders for this visit:  Type 2 diabetes mellitus without complication, without long-term  current use of insulin (HCC) Currently on no medication  No recent A1C  Blood pressure check Unable to determine nurse will start keeping a log of his daily Bp   Tobacco use disorder He is on the kidney transplant list and is aware of ncreased risk for lung cancer and other respiratory diseases recommend cessation.  This will be reminded at each clinical visit.   Primary insomnia -     traZODone (DESYREL) 100 MG tablet; Take 1 tablet (100 mg total) by mouth at bedtime as needed for sleep. Take 1/2 to 1 tablet at bedtime for insomnia  OSA (obstructive sleep apnea) -     Nocturnal polysomnography (NPSG); Future Patient presents with possible obstructive sleep apnea. Patent has a  history of symptoms of OSA. Patient generally gets 3 or 4 hours of sleep per night, and states they generally have nightime awakenings. Snoring of severe severity are present. Apneic episodes is not present. Nasal obstruction is not present.  Patient has had tonsillectomy.   Chronic pain syndrome  Follow Up Instructions:    I discussed the assessment and treatment plan with the patient. The patient was provided an opportunity to ask questions and all were answered. The patient agreed with the plan and demonstrated an understanding of the instructions.   The patient was advised to call back or seek an in-person evaluation if the symptoms worsen or if the condition fails to improve as anticipated.  I provided 16 minutes of non-face-to-face time during this encounter.   Kerin Perna, NP

## 2019-11-05 NOTE — Progress Notes (Signed)
Pt has not taken bp today he just woke up Has not been able to sleep for the past few nights  Can not find Bp machine to recall readings back

## 2019-11-08 ENCOUNTER — Encounter: Payer: Self-pay | Admitting: Gastroenterology

## 2019-11-13 ENCOUNTER — Telehealth: Payer: Self-pay | Admitting: *Deleted

## 2019-11-13 NOTE — Telephone Encounter (Signed)
Dr Ardis Hughs  This pt is scheduled for a PV 7-8 and colon 7-21- he has a hx of CKD stage 5 , htn, hypertensive heart disease with diastolic heart failure, DM- no meds, hx of seizures, last 27 yrs ago - he is on the renal transplant list- blind    he has a referral for a screening colon in Epic- Epic note states ok to sch pt in [place] per Dr Ardis Hughs - he has never had any GI hx with you-  And there is no documented TE for this pt --I wanted to be sure that he is ok to proceed as scheduled due to his hx with PV and direct colon and no OV    Thanks so much for your time  Marijean Niemann

## 2019-11-13 NOTE — Telephone Encounter (Signed)
error 

## 2019-11-14 DIAGNOSIS — M545 Low back pain: Secondary | ICD-10-CM | POA: Diagnosis not present

## 2019-11-14 DIAGNOSIS — J3089 Other allergic rhinitis: Secondary | ICD-10-CM | POA: Diagnosis not present

## 2019-11-14 DIAGNOSIS — J3081 Allergic rhinitis due to animal (cat) (dog) hair and dander: Secondary | ICD-10-CM | POA: Diagnosis not present

## 2019-11-14 DIAGNOSIS — M79604 Pain in right leg: Secondary | ICD-10-CM | POA: Diagnosis not present

## 2019-11-14 DIAGNOSIS — M25571 Pain in right ankle and joints of right foot: Secondary | ICD-10-CM | POA: Diagnosis not present

## 2019-11-14 DIAGNOSIS — M79671 Pain in right foot: Secondary | ICD-10-CM | POA: Diagnosis not present

## 2019-11-14 DIAGNOSIS — M25572 Pain in left ankle and joints of left foot: Secondary | ICD-10-CM | POA: Diagnosis not present

## 2019-11-14 DIAGNOSIS — R208 Other disturbances of skin sensation: Secondary | ICD-10-CM | POA: Diagnosis not present

## 2019-11-14 DIAGNOSIS — M79605 Pain in left leg: Secondary | ICD-10-CM | POA: Diagnosis not present

## 2019-11-14 DIAGNOSIS — M79672 Pain in left foot: Secondary | ICD-10-CM | POA: Diagnosis not present

## 2019-11-14 DIAGNOSIS — J309 Allergic rhinitis, unspecified: Secondary | ICD-10-CM | POA: Diagnosis not present

## 2019-11-14 DIAGNOSIS — G894 Chronic pain syndrome: Secondary | ICD-10-CM | POA: Diagnosis not present

## 2019-11-14 NOTE — Telephone Encounter (Signed)
The pt has been scheduled for 11/20/19 at 250 pm for appt with Dr Ardis Hughs.  The pt is aware

## 2019-11-14 NOTE — Telephone Encounter (Signed)
Please cancel his upcoming PV.  Instead I want to see him Wednesday July 7th in PM in my office.  Keep him on for the [facility] appt for now.  Patty, Please add to the end of my already full schedule for OV next Wednesday. If he cannot make that appt then offer him my first available OV. May need to cancel the [facility] appt if that OV is not before.  Thanks

## 2019-11-20 ENCOUNTER — Ambulatory Visit: Payer: Medicaid Other | Admitting: Gastroenterology

## 2019-11-28 ENCOUNTER — Ambulatory Visit: Payer: Medicaid Other | Admitting: Physician Assistant

## 2019-12-04 ENCOUNTER — Encounter: Payer: Medicaid Other | Admitting: Gastroenterology

## 2019-12-05 ENCOUNTER — Other Ambulatory Visit: Payer: Self-pay

## 2019-12-05 ENCOUNTER — Ambulatory Visit (HOSPITAL_BASED_OUTPATIENT_CLINIC_OR_DEPARTMENT_OTHER): Payer: Medicaid Other | Attending: Primary Care | Admitting: Internal Medicine

## 2019-12-05 VITALS — Ht 73.0 in | Wt 201.0 lb

## 2019-12-05 DIAGNOSIS — G4761 Periodic limb movement disorder: Secondary | ICD-10-CM | POA: Diagnosis not present

## 2019-12-05 DIAGNOSIS — I493 Ventricular premature depolarization: Secondary | ICD-10-CM | POA: Diagnosis not present

## 2019-12-05 DIAGNOSIS — R0683 Snoring: Secondary | ICD-10-CM | POA: Insufficient documentation

## 2019-12-05 DIAGNOSIS — G4733 Obstructive sleep apnea (adult) (pediatric): Secondary | ICD-10-CM | POA: Insufficient documentation

## 2019-12-12 DIAGNOSIS — M545 Low back pain: Secondary | ICD-10-CM | POA: Diagnosis not present

## 2019-12-12 DIAGNOSIS — M79605 Pain in left leg: Secondary | ICD-10-CM | POA: Diagnosis not present

## 2019-12-12 DIAGNOSIS — G894 Chronic pain syndrome: Secondary | ICD-10-CM | POA: Diagnosis not present

## 2019-12-12 DIAGNOSIS — R208 Other disturbances of skin sensation: Secondary | ICD-10-CM | POA: Diagnosis not present

## 2019-12-12 DIAGNOSIS — M25572 Pain in left ankle and joints of left foot: Secondary | ICD-10-CM | POA: Diagnosis not present

## 2019-12-12 DIAGNOSIS — M79672 Pain in left foot: Secondary | ICD-10-CM | POA: Diagnosis not present

## 2019-12-12 DIAGNOSIS — M79604 Pain in right leg: Secondary | ICD-10-CM | POA: Diagnosis not present

## 2019-12-12 DIAGNOSIS — M25571 Pain in right ankle and joints of right foot: Secondary | ICD-10-CM | POA: Diagnosis not present

## 2019-12-12 DIAGNOSIS — M79671 Pain in right foot: Secondary | ICD-10-CM | POA: Diagnosis not present

## 2019-12-12 DIAGNOSIS — J309 Allergic rhinitis, unspecified: Secondary | ICD-10-CM | POA: Diagnosis not present

## 2019-12-12 DIAGNOSIS — J301 Allergic rhinitis due to pollen: Secondary | ICD-10-CM | POA: Diagnosis not present

## 2019-12-12 DIAGNOSIS — J3089 Other allergic rhinitis: Secondary | ICD-10-CM | POA: Diagnosis not present

## 2019-12-14 DIAGNOSIS — G4733 Obstructive sleep apnea (adult) (pediatric): Secondary | ICD-10-CM

## 2019-12-14 NOTE — Procedures (Signed)
   Patient Name: Cody Webster, Cody Webster Date: 12/05/2019 Gender: Male D.O.B: 07/02/1965 Age (years): 73 Referring Provider: Kerin Perna NP Height (inches): 71 Interpreting Physician: Baird Lyons MD, ABSM Weight (lbs): 201 RPSGT: Jorge Ny BMI: 27 MRN: [id number] Neck Size: 16.50  CLINICAL INFORMATION Sleep Study Type: NPSG Indication for sleep study: Congestive Heart Failure, Diabetes, Hypertension, Insomnia, Morning Headaches, Snoring Epworth Sleepiness Score: 3  SLEEP STUDY TECHNIQUE As per the AASM Manual for the Scoring of Sleep and Associated Events v2.3 (April 2016) with a hypopnea requiring 4% desaturations.  The channels recorded and monitored were frontal, central and occipital EEG, electrooculogram (EOG), submentalis EMG (chin), nasal and oral airflow, thoracic and abdominal wall motion, anterior tibialis EMG, snore microphone, electrocardiogram, and pulse oximetry.  MEDICATIONS Medications self-administered by patient taken the night of the study : none reported  SLEEP ARCHITECTURE The study was initiated at 10:57:51 PM and ended at 5:17:25 AM.  Sleep onset time was 10.0 minutes and the sleep efficiency was 83.1%%. The total sleep time was 315.5 minutes.  Stage REM latency was 191.5 minutes.  The patient spent 9.2%% of the night in stage N1 sleep, 79.9%% in stage N2 sleep, 0.0%% in stage N3 and 10.9% in REM.  Alpha intrusion was absent.  Supine sleep was 7.13%.  RESPIRATORY PARAMETERS The overall apnea/hypopnea index (AHI) was 1.9 per hour. There were 1 total apneas, including 1 obstructive, 0 central and 0 mixed apneas. There were 9 hypopneas and 12 RERAs.  The AHI during Stage REM sleep was 7.0 per hour.  AHI while supine was 10.7 per hour.  The mean oxygen saturation was 96.1%. The minimum SpO2 during sleep was 90.0%.  moderate snoring was noted during this study.  CARDIAC DATA The 2 lead EKG demonstrated sinus rhythm. The mean heart  rate was 81.9 beats per minute. Other EKG findings include: PVCs.  LEG MOVEMENT DATA The total PLMS were 0 with a resulting PLMS index of 0.0. Associated arousal with leg movement index was 15.0 .  IMPRESSIONS - No significant obstructive sleep apnea occurred during this study (AHI = 1.9/h). - No significant central sleep apnea occurred during this study (CAI = 0.0/h). - The patient had minimal or no oxygen desaturation during the study (Min O2 = 90.0%) - The patient snored with moderate snoring volume. - EKG findings include PVCs. - Limb movement total 371. Limb movements with arousal 79 (15/ hr).  DIAGNOSIS - Periodic Limb Movement During Sleep (G47.61)  RECOMMENDATIONS - Consider trial of Mirapex, Requip, or Sinemet for treatment of Periodic Leg Movements of Sleep. - Sleep hygiene should be reviewed to assess factors that may improve sleep quality. - Weight management and regular exercise should be initiated or continued if appropriate.  [Electronically signed] 12/14/2019 11:23 AM  Baird Lyons MD, ABSM Diplomate, American Board of Sleep Medicine   NPI: [id number]                          [place], [facility]  ELECTRONICALLY SIGNED ON:  12/14/2019, 11:20 AM [place] PH: [phone]   FX: [phone] [place]

## 2019-12-16 ENCOUNTER — Other Ambulatory Visit (INDEPENDENT_AMBULATORY_CARE_PROVIDER_SITE_OTHER): Payer: Self-pay | Admitting: Primary Care

## 2019-12-16 DIAGNOSIS — G2581 Restless legs syndrome: Secondary | ICD-10-CM

## 2019-12-16 MED ORDER — ROPINIROLE HCL 0.25 MG PO TABS: 0.2500 mg | ORAL_TABLET | Freq: Every day | ORAL | 0 refills | Status: DC

## 2019-12-17 ENCOUNTER — Telehealth (INDEPENDENT_AMBULATORY_CARE_PROVIDER_SITE_OTHER): Payer: Self-pay

## 2019-12-17 NOTE — Telephone Encounter (Signed)
-----   Message from Kerin Perna, NP sent at 12/16/2019  9:54 PM EDT ----- Reviewing Nocturnal polysomnography no OSA but does have restless leg syndrome Requip 0.25 mg take at bedtime sent into the pharmacy

## 2019-12-17 NOTE — Telephone Encounter (Signed)
Patient is aware that he does not have OSA but does have restless leg syndrome medication sent to pharmacy; take at bedtime. He verbalized understanding and did not have any questions. Nat Christen, CMA

## 2020-01-01 DIAGNOSIS — H44521 Atrophy of globe, right eye: Secondary | ICD-10-CM | POA: Diagnosis not present

## 2020-01-01 DIAGNOSIS — H5711 Ocular pain, right eye: Secondary | ICD-10-CM | POA: Diagnosis not present

## 2020-01-02 DIAGNOSIS — N2581 Secondary hyperparathyroidism of renal origin: Secondary | ICD-10-CM | POA: Diagnosis not present

## 2020-01-02 DIAGNOSIS — N189 Chronic kidney disease, unspecified: Secondary | ICD-10-CM | POA: Diagnosis not present

## 2020-01-02 DIAGNOSIS — N185 Chronic kidney disease, stage 5: Secondary | ICD-10-CM | POA: Diagnosis not present

## 2020-01-09 DIAGNOSIS — J3081 Allergic rhinitis due to animal (cat) (dog) hair and dander: Secondary | ICD-10-CM | POA: Diagnosis not present

## 2020-01-09 DIAGNOSIS — M79671 Pain in right foot: Secondary | ICD-10-CM | POA: Diagnosis not present

## 2020-01-09 DIAGNOSIS — M79604 Pain in right leg: Secondary | ICD-10-CM | POA: Diagnosis not present

## 2020-01-09 DIAGNOSIS — M545 Low back pain: Secondary | ICD-10-CM | POA: Diagnosis not present

## 2020-01-09 DIAGNOSIS — G894 Chronic pain syndrome: Secondary | ICD-10-CM | POA: Diagnosis not present

## 2020-01-09 DIAGNOSIS — J3089 Other allergic rhinitis: Secondary | ICD-10-CM | POA: Diagnosis not present

## 2020-01-09 DIAGNOSIS — J309 Allergic rhinitis, unspecified: Secondary | ICD-10-CM | POA: Diagnosis not present

## 2020-01-09 DIAGNOSIS — M79605 Pain in left leg: Secondary | ICD-10-CM | POA: Diagnosis not present

## 2020-01-09 DIAGNOSIS — R208 Other disturbances of skin sensation: Secondary | ICD-10-CM | POA: Diagnosis not present

## 2020-01-09 DIAGNOSIS — M79672 Pain in left foot: Secondary | ICD-10-CM | POA: Diagnosis not present

## 2020-01-09 DIAGNOSIS — M25572 Pain in left ankle and joints of left foot: Secondary | ICD-10-CM | POA: Diagnosis not present

## 2020-01-09 DIAGNOSIS — M25571 Pain in right ankle and joints of right foot: Secondary | ICD-10-CM | POA: Diagnosis not present

## 2020-01-13 ENCOUNTER — Encounter: Payer: Self-pay | Admitting: Orthopedic Surgery

## 2020-01-13 ENCOUNTER — Ambulatory Visit (INDEPENDENT_AMBULATORY_CARE_PROVIDER_SITE_OTHER): Payer: Medicaid Other | Admitting: Physician Assistant

## 2020-01-13 VITALS — Ht 73.0 in | Wt 201.0 lb

## 2020-01-13 DIAGNOSIS — B351 Tinea unguium: Secondary | ICD-10-CM

## 2020-01-13 NOTE — Progress Notes (Signed)
Office Visit Note   Patient: Cody Webster           Date of Birth: 18-Apr-1966           MRN: [id number] Visit Date: 01/13/2020              Requested by: Kerin Perna, NP [address] [place],  [place] [address] PCP: Kerin Perna, NP  Chief Complaint  Patient presents with  . Right Foot - Pain  . Left Foot - Pain      HPI: This is a pleasant gentleman who comes in for bilateral foot check.  Also would like his nails trimmed.  He has noticed the development a thickened callus on the lateral plantar side of his left foot.  Wants to make sure this is okay.  Assessment & Plan: Visit Diagnoses: No diagnosis found.  Plan: Follow-up in 1 month  Follow-Up Instructions: No follow-ups on file.   Ortho Exam  Patient is alert, oriented, no adenopathy, well-dressed, normal affect, normal respiratory effort. Palpable dorsalis pedis pulses.  He has some skin discoloration from previous ulcers no cellulitis no foul odor no drainage.  He does have a thickened callus beneath the fifth MTP of his left foot.  There is no surrounding cellulitis or drainage.  After obtaining verbal consent this was debrided to a healthy surface.  Nails trimmed x9  Imaging: No results found. No images are attached to the encounter.  Labs: Lab Results  Component Value Date   HGBA1C 4.7 09/25/2019   HGBA1C 5.1 07/20/2017   HGBA1C 6.7 (H) 04/23/2016   ESRSEDRATE 25 (H) 12/10/2013   CRP <0.5 (L) 12/10/2013   REPTSTATUS 04/30/2016 FINAL 04/24/2016   REPTSTATUS 04/30/2016 FINAL 04/24/2016   CULT  04/24/2016    NO GROWTH 5 DAYS Performed at [facility]  04/24/2016    NO GROWTH 5 DAYS Performed at [facility]      Lab Results  Component Value Date   ALBUMIN 4.0 08/30/2017   ALBUMIN 3.4 (L) 07/20/2017   ALBUMIN 2.6 (L) 04/27/2016    Lab Results  Component Value Date   MG 1.7 04/27/2016   MG 1.9 04/26/2016   MG 1.8 04/25/2016   No results found for:  VD25OH  No results found for: PREALBUMIN CBC EXTENDED Latest Ref Rng & Units 10/03/2019 01/03/2018 12/20/2017  WBC 4.0 - 10.5 K/uL 4.5 - -  RBC 4.22 - 5.81 MIL/uL 3.20(L) - -  HGB 13.0 - 17.0 g/dL 9.6(L) 10.8(L) 9.9(L)  HCT 39 - 52 % 31.8(L) - -  PLT 150 - 400 K/uL 184 - -  NEUTROABS 1.7 - 7.7 K/uL - - -  LYMPHSABS 0.7 - 4.0 K/uL - - -     Body mass index is 26.52 kg/m.  Orders:  No orders of the defined types were placed in this encounter.  No orders of the defined types were placed in this encounter.    Procedures: No procedures performed  Clinical Data: No additional findings.  ROS:  All other systems negative, except as noted in the HPI. Review of Systems  Objective: Vital Signs: Ht 6\' 1"  (1.854 m)   Wt 201 lb (91.2 kg)   BMI 26.52 kg/m   Specialty Comments:  No specialty comments available.  PMFS History: Patient Active Problem List   Diagnosis Date Noted  . OSA (obstructive sleep apnea) 12/05/2019  . Chest pain 07/20/2017  . Preoperative cardiovascular examination 11/04/2016  . Malnutrition of  moderate degree ([facility]) 01/12/2015  . DM type 2, uncontrolled, with neuropathy ([place]) 01/11/2015  . Glaucoma   . Eye pain   . Acute glaucoma of right eye 09/06/2014  . Acute renal failure superimposed on chronic kidney disease ([place]) 09/06/2014  . Anemia 09/06/2014  . Tobacco use disorder 09/06/2014  . Frequent headaches 07/11/2014  . CKD (chronic kidney disease), stage IV ([facility]) 07/11/2014  . 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 Ataxia 07/11/2014  . CKD (chronic kidney disease) 12/11/2013  . Diabetic foot ulcer ([facility]) 12/10/2013  . Essential hypertension 12/10/2013  . Peripheral neuropathy ([place]) 12/10/2013   Past Medical History:  Diagnosis Date  . Carotid artery occlusion   . 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 Diabetes mellitus  without complication ([place])    type 2  . GERD (gastroesophageal reflux disease)   . Hypertension   . 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 ([place]) 2015   EF now improved back to normal mildly reduced  . Peripheral neuropathy   . Pneumonia   . Seizures ([place])    27 years ago    Family History  Problem Relation Age of Onset  . CAD Mother   . Diabetes type II Father   . Hypertension Sister   . CAD Sister   . Diabetes type II Brother     Past Surgical History:  Procedure Laterality Date  . AV FISTULA PLACEMENT Left 11/08/2016   Procedure: ARTERIOVENOUS (AV) FISTULA CREATION;  Surgeon: Angelia Mould, MD;  Location: [facility];  Service: Vascular;  Laterality: Left;  . NM MYOVIEW LTD  07/2016   "Moderate sized moderate severity" (on cardiology was very small size, small intensity) partially reversible inferoapical//inferoseptal defect.  (Read as intermediate-high risk) -> over read by cardiology as LOW RISK  . right foot surgery    . TOE AMPUTATION    . TRANSTHORACIC ECHOCARDIOGRAM  07/2016   EF 60%.,  Moderate LVH.  GR 1 DD.  No R WMA.  Mild RV and moderate RA dilation.   Social History   Occupational History    Comment: Disabled - for Back pain & foot ulcer  Tobacco Use  . Smoking status: Current Every Day Smoker    Packs/day: 1.00    Years: 30.00    Pack years: 30.00    Types: Cigarettes, Cigars  . Smokeless tobacco: Former Systems developer    Types: Snuff, Sarina Ser    Quit date: 12/11/1983  Vaping Use  . Vaping Use: Former  Substance and Sexual Activity  . Alcohol use: Yes    Comment: ocassaionlly   . Drug use: No  . Sexual activity: Not Currently

## 2020-01-24 DIAGNOSIS — E1122 Type 2 diabetes mellitus with diabetic chronic kidney disease: Secondary | ICD-10-CM | POA: Diagnosis not present

## 2020-01-24 DIAGNOSIS — N186 End stage renal disease: Secondary | ICD-10-CM | POA: Diagnosis not present

## 2020-01-28 DIAGNOSIS — E113593 Type 2 diabetes mellitus with proliferative diabetic retinopathy without macular edema, bilateral: Secondary | ICD-10-CM | POA: Diagnosis not present

## 2020-01-28 DIAGNOSIS — H44521 Atrophy of globe, right eye: Secondary | ICD-10-CM | POA: Diagnosis not present

## 2020-02-06 ENCOUNTER — Encounter (HOSPITAL_COMMUNITY): Payer: Self-pay

## 2020-02-06 ENCOUNTER — Emergency Department (HOSPITAL_COMMUNITY)
Admission: EM | Admit: 2020-02-06 | Discharge: 2020-02-06 | Disposition: A | Discharge status: Home / Self Care | Payer: Medicaid Other | Attending: Emergency Medicine | Admitting: Emergency Medicine

## 2020-02-06 DIAGNOSIS — R531 Weakness: Secondary | ICD-10-CM | POA: Insufficient documentation

## 2020-02-06 DIAGNOSIS — M79604 Pain in right leg: Secondary | ICD-10-CM | POA: Diagnosis not present

## 2020-02-06 DIAGNOSIS — M25572 Pain in left ankle and joints of left foot: Secondary | ICD-10-CM | POA: Diagnosis not present

## 2020-02-06 DIAGNOSIS — G894 Chronic pain syndrome: Secondary | ICD-10-CM | POA: Diagnosis not present

## 2020-02-06 DIAGNOSIS — J3089 Other allergic rhinitis: Secondary | ICD-10-CM | POA: Diagnosis not present

## 2020-02-06 DIAGNOSIS — J309 Allergic rhinitis, unspecified: Secondary | ICD-10-CM | POA: Diagnosis not present

## 2020-02-06 DIAGNOSIS — M79671 Pain in right foot: Secondary | ICD-10-CM | POA: Diagnosis not present

## 2020-02-06 DIAGNOSIS — R42 Dizziness and giddiness: Secondary | ICD-10-CM | POA: Diagnosis not present

## 2020-02-06 DIAGNOSIS — J301 Allergic rhinitis due to pollen: Secondary | ICD-10-CM | POA: Diagnosis not present

## 2020-02-06 DIAGNOSIS — J3081 Allergic rhinitis due to animal (cat) (dog) hair and dander: Secondary | ICD-10-CM | POA: Diagnosis not present

## 2020-02-06 DIAGNOSIS — M5489 Other dorsalgia: Secondary | ICD-10-CM | POA: Diagnosis not present

## 2020-02-06 DIAGNOSIS — R110 Nausea: Secondary | ICD-10-CM | POA: Diagnosis not present

## 2020-02-06 DIAGNOSIS — R208 Other disturbances of skin sensation: Secondary | ICD-10-CM | POA: Diagnosis not present

## 2020-02-06 DIAGNOSIS — M25571 Pain in right ankle and joints of right foot: Secondary | ICD-10-CM | POA: Diagnosis not present

## 2020-02-06 DIAGNOSIS — M79605 Pain in left leg: Secondary | ICD-10-CM | POA: Diagnosis not present

## 2020-02-06 DIAGNOSIS — R001 Bradycardia, unspecified: Secondary | ICD-10-CM | POA: Diagnosis not present

## 2020-02-06 DIAGNOSIS — Z5321 Procedure and treatment not carried out due to patient leaving prior to being seen by health care provider: Secondary | ICD-10-CM | POA: Diagnosis not present

## 2020-02-06 DIAGNOSIS — M79672 Pain in left foot: Secondary | ICD-10-CM | POA: Diagnosis not present

## 2020-02-06 DIAGNOSIS — R0902 Hypoxemia: Secondary | ICD-10-CM | POA: Diagnosis not present

## 2020-02-06 NOTE — ED Triage Notes (Signed)
Pt presents with c/o weakness and dizziness. Pt was at the pain management clinic and started to feel weak and dizzy upon standing. Pt does have a fistula in place but is not on dialysis at this time.

## 2020-02-10 DIAGNOSIS — N189 Chronic kidney disease, unspecified: Secondary | ICD-10-CM | POA: Diagnosis not present

## 2020-02-10 DIAGNOSIS — N185 Chronic kidney disease, stage 5: Secondary | ICD-10-CM | POA: Diagnosis not present

## 2020-02-10 DIAGNOSIS — N2581 Secondary hyperparathyroidism of renal origin: Secondary | ICD-10-CM | POA: Diagnosis not present

## 2020-02-21 ENCOUNTER — Other Ambulatory Visit (INDEPENDENT_AMBULATORY_CARE_PROVIDER_SITE_OTHER): Payer: Self-pay | Admitting: Primary Care

## 2020-02-21 DIAGNOSIS — F17200 Nicotine dependence, unspecified, uncomplicated: Secondary | ICD-10-CM

## 2020-02-21 MED ORDER — BUPROPION HCL ER (XL) 150 MG PO TB24: 150.0000 mg | ORAL_TABLET | Freq: Every day | ORAL | 1 refills | Status: DC

## 2020-03-02 ENCOUNTER — Ambulatory Visit (INDEPENDENT_AMBULATORY_CARE_PROVIDER_SITE_OTHER): Payer: Medicaid Other | Admitting: Primary Care

## 2020-03-02 ENCOUNTER — Encounter (INDEPENDENT_AMBULATORY_CARE_PROVIDER_SITE_OTHER): Payer: Self-pay | Admitting: Primary Care

## 2020-03-02 ENCOUNTER — Other Ambulatory Visit: Payer: Self-pay

## 2020-03-02 VITALS — BP 135/76 | HR 89 | Temp 97.5°F | Ht 73.0 in | Wt 193.0 lb

## 2020-03-02 DIAGNOSIS — Z9114 Patient's other noncompliance with medication regimen: Secondary | ICD-10-CM | POA: Diagnosis not present

## 2020-03-02 DIAGNOSIS — Z9181 History of falling: Secondary | ICD-10-CM

## 2020-03-02 DIAGNOSIS — R112 Nausea with vomiting, unspecified: Secondary | ICD-10-CM | POA: Diagnosis not present

## 2020-03-02 DIAGNOSIS — I10 Essential (primary) hypertension: Secondary | ICD-10-CM

## 2020-03-02 DIAGNOSIS — F4310 Post-traumatic stress disorder, unspecified: Secondary | ICD-10-CM | POA: Diagnosis not present

## 2020-03-02 DIAGNOSIS — Z0000 Encounter for general adult medical examination without abnormal findings: Secondary | ICD-10-CM | POA: Diagnosis not present

## 2020-03-02 MED ORDER — ONDANSETRON 4 MG PO TBDP
4.0000 mg | ORAL_TABLET | Freq: Once | ORAL | Status: AC
  Administered 2020-03-02: 4 mg via ORAL

## 2020-03-02 NOTE — Progress Notes (Signed)
Established Patient Office Visit  Subjective:  Patient ID: Cody Webster, male    DOB: 07-Nov-1965  Age: 54 y.o. MRN: [id number]  CC:  Chief Complaint  Patient presents with  . pcs services    HPI [name] is 54 year old male  presents for needing assistance with care in the home.  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  He has a history of seizures but has not had one in an extensive period of time.  He will have difficulty with ADLs example cooking for self, he is able to provide personal care such as bathing grooming brushing teeth.  He also has a unstable gait fall risk with several falls in the last 2 weeks.   Past Medical History:  Diagnosis Date  . Carotid artery occlusion   . 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 Diabetes mellitus without complication ([facility])    type 2  . GERD (gastroesophageal reflux disease)   . Hypertension   . 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 Peripheral neuropathy   . Pneumonia   . Seizures ([facility])    27 years ago    Past Surgical History:  Procedure Laterality Date  . AV FISTULA PLACEMENT Left 11/08/2016   Procedure: ARTERIOVENOUS (AV) FISTULA CREATION;  Surgeon: Angelia Mould, MD;  Location: [facility];  Service: Vascular;  Laterality: Left;  . NM MYOVIEW LTD  07/2016   "Moderate sized moderate severity" (on cardiology was very small size, small intensity) partially reversible inferoapical//inferoseptal defect.  (Read as intermediate-high risk) -> over read by cardiology as LOW RISK  . right foot surgery    . TOE AMPUTATION    . TRANSTHORACIC ECHOCARDIOGRAM  07/2016   EF 60%.,  Moderate LVH.  GR 1 DD.  No R WMA.  Mild RV and moderate RA dilation.  And stage I disease.  Family History  Problem Relation Age of Onset  . CAD Mother   . Diabetes type II  Father   . Hypertension Sister   . CAD Sister   . Diabetes type II Brother     Social History   Socioeconomic History  . Marital status: Single    Spouse name: Not on file  . Number of children: 1  . Years of education: Not on file  . Highest education level: Not on file  Occupational History    Comment: Disabled - for Back pain & foot ulcer  Tobacco Use  . Smoking status: Current Every Day Smoker    Packs/day: 1.00    Years: 30.00    Pack years: 30.00    Types: Cigarettes, Cigars  . Smokeless tobacco: Former Systems developer    Types: Snuff, Sarina Ser    Quit date: 12/11/1983  Vaping Use  . Vaping Use: Former  Substance and Sexual Activity  . Alcohol use: Yes    Comment: ocassaionlly   . Drug use: No  . Sexual activity: Not Currently  Other Topics Concern  . Not on file  Social History Narrative  . Not on file   Social Determinants of Health   Financial Resource Strain:   . Difficulty of Paying Living Expenses: Not on file  Food Insecurity:   . Worried About Charity fundraiser in the Last Year: Not on file  . Ran Out  of Food in the Last Year: Not on file  Transportation Needs:   . Lack of Transportation (Medical): Not on file  . Lack of Transportation (Non-Medical): Not on file  Physical Activity:   . Days of Exercise per Week: Not on file  . Minutes of Exercise per Session: Not on file  Stress:   . Feeling of Stress : Not on file  Social Connections:   . Frequency of Communication with Friends and Family: Not on file  . Frequency of Social Gatherings with Friends and Family: Not on file  . Attends Religious Services: Not on file  . Active Member of Clubs or Organizations: Not on file  . Attends Archivist Meetings: Not on file  . Marital Status: Not on file  Intimate Partner Violence:   . Fear of Current or Ex-Partner: Not on file  . Emotionally Abused: Not on file  . Physically Abused: Not on file  . Sexually Abused: Not on file    Outpatient Medications  Prior to Visit  Medication Sig Dispense Refill  . acetaminophen (TYLENOL) 500 MG tablet Take 1,000 mg by mouth every 6 (six) hours as needed (for pain/headaches.).    Marland Kitchen aspirin EC 81 MG tablet Take 81 mg by mouth daily.    Marland Kitchen atorvastatin (LIPITOR) 80 MG tablet Take 1 tablet (80 mg total) by mouth daily at 6 PM. 90 tablet 3  . buPROPion (WELLBUTRIN XL) 150 MG 24 hr tablet Take 1 tablet (150 mg total) by mouth daily. 90 tablet 1  . carvedilol (COREG) 25 MG tablet Take 1 tablet (25 mg total) by mouth 2 (two) times daily with a meal. Reported on 05/27/2015 180 tablet 3  . cetirizine (ZYRTEC) 10 MG tablet Take 1 tablet (10 mg total) by mouth daily. 90 tablet 6  . cloNIDine (CATAPRES) 0.1 MG tablet Take 1 tablet (0.1 mg total) by mouth 3 (three) times daily. 270 tablet 3  . fluticasone (FLONASE) 50 MCG/ACT nasal spray Place 2 sprays into both nostrils daily. 16 g 6  . gabapentin (NEURONTIN) 300 MG capsule TAKE 1 CAPSULE (300 MG TOTAL) BY MOUTH 2 (TWO) TIMES DAILY. 60 capsule 3  . hydrALAZINE (APRESOLINE) 50 MG tablet TAKE 2 TABLETS (100 MG TOTAL) BY MOUTH 3 (THREE) TIMES DAILY. 180 tablet 2  . nitroGLYCERIN (NITROSTAT) 0.4 MG SL tablet Place 1 tablet (0.4 mg total) under the tongue every 5 (five) minutes as needed for chest pain (CP or SOB). 30 tablet 0  . oxyCODONE-acetaminophen (PERCOCET) 10-325 MG tablet Take 1 tablet by mouth every 6 (six) hours as needed. For pain. 8 tablet 0  . rOPINIRole (REQUIP) 0.25 MG tablet Take 1 tablet (0.25 mg total) by mouth at bedtime. 90 tablet 0  . traZODone (DESYREL) 100 MG tablet Take 1 tablet (100 mg total) by mouth at bedtime as needed for sleep. Take 1/2 to 1 tablet at bedtime for insomnia 90 tablet 1   No facility-administered medications prior to visit.    Allergies  Allergen Reactions  . Influenza Vaccines     Chest congestion, fever  . Morphine And Related Shortness Of Breath and Other (See Comments)    Hallucination  Tolerates Norco/Vicodin  .  Pneumococcal Vaccines Nausea And Vomiting    FEVER    ROS Review of Systems  Eyes: Positive for visual disturbance.  Respiratory: Positive for shortness of breath.        With excertion  Musculoskeletal: Positive for gait problem.  Neurological: Positive for weakness.  Lower extremities   Psychiatric/Behavioral: Positive for sleep disturbance.       PTSD  All other systems reviewed and are negative.     Objective:    Physical Exam Vitals reviewed.  Constitutional:      Appearance: Normal appearance.  HENT:     Head: Normocephalic.     Right Ear: Tympanic membrane normal.     Left Ear: Tympanic membrane normal.  Eyes:     Extraocular Movements: Extraocular movements intact.  Cardiovascular:     Rate and Rhythm: Normal rate and regular rhythm.  Pulmonary:     Effort: Pulmonary effort is normal.     Breath sounds: Normal breath sounds.  Abdominal:     General: Bowel sounds are normal.     Palpations: Abdomen is soft.  Musculoskeletal:        General: Normal range of motion.     Cervical back: Normal range of motion.     Comments: Shunt no thrill felt or bruit auscultated   Skin:    General: Skin is warm and dry.  Neurological:     Mental Status: He is alert and oriented to person, place, and time.  Psychiatric:        Mood and Affect: Mood normal.        Behavior: Behavior normal.        Thought Content: Thought content normal.        Judgment: Judgment normal.     BP 135/76 (BP Location: Right Arm, Patient Position: Sitting, Cuff Size: Normal)   Pulse 89   Temp (!) 97.5 F (36.4 C) (Temporal)   Ht 6\' 1"  (1.854 m)   Wt 193 lb (87.5 kg)   SpO2 97%   BMI 25.46 kg/m  Wt Readings from Last 3 Encounters:  03/02/20 193 lb (87.5 kg)  01/13/20 201 lb (91.2 kg)  12/05/19 201 lb (91.2 kg)     Health Maintenance Due  Topic Date Due  . Hepatitis C Screening  Never done  . PNEUMOCOCCAL POLYSACCHARIDE VACCINE AGE 40-64 HIGH RISK  Never done  .  OPHTHALMOLOGY EXAM  Never done  . COVID-19 Vaccine (1) Never done  . TETANUS/TDAP  Never done  . COLONOSCOPY  Never done    There are no preventive care reminders to display for this patient.  Lab Results  Component Value Date   TSH 1.016 07/12/2014   Lab Results  Component Value Date   WBC 4.5 10/03/2019   HGB 9.6 (L) 10/03/2019   HCT 31.8 (L) 10/03/2019   MCV 99.4 10/03/2019   PLT 184 10/03/2019   Lab Results  Component Value Date   NA 139 10/03/2019   K 4.7 10/03/2019   CO2 19 (L) 10/03/2019   GLUCOSE 105 (H) 10/03/2019   BUN 55 (H) 10/03/2019   CREATININE 8.04 (H) 10/03/2019   BILITOT 0.3 08/30/2017   ALKPHOS 708 (H) 08/30/2017   AST 53 (H) 08/30/2017   ALT 50 (H) 08/30/2017   PROT 6.8 08/30/2017   ALBUMIN 4.0 08/30/2017   CALCIUM 8.1 (L) 10/03/2019   ANIONGAP 10 10/03/2019   Lab Results  Component Value Date   CHOL 163 08/30/2017   Lab Results  Component Value Date   HDL 78 08/30/2017   Lab Results  Component Value Date   LDLCALC 65 08/30/2017   Lab Results  Component Value Date   TRIG 102 08/30/2017   Lab Results  Component Value Date   CHOLHDL 2.1 08/30/2017   Lab Results  Component Value Date   HGBA1C 4.7 09/25/2019      Assessment & Plan:  Johnrobert was seen today for pcs services.  Diagnoses and all orders for this visit:  At maximum risk for fall Blind with multiple falls weekly and unstable gait -need for increase supervision   PTSD (post-traumatic stress disorder)   Office Visit from 03/02/2020 in [facility]  PHQ-9 Total Score 11    On trazodone 100 mg at bedtime for sleep Wellbutrin XL 150mg  for  smoking sensation and depression  Noncompliance with medications He admits to forgetting to take them at times would like someone to help him get back on track with medication regiment   Essential hypertension Counseled on blood pressure goal of less than 130/80, low-sodium, DASH diet, medication compliance,  150 minutes of moderate intensity exercise per week. Discussed medication compliance, adverse effects.  Healthcare maintenance Deferred vaccines became nauseous/vomiting  and tx with antiemetic medication   Nausea and vomiting, intractability of vomiting not specified, unspecified vomiting type -     ondansetron (ZOFRAN-ODT) disintegrating tablet 4 mg    Meds ordered this encounter  Medications  . ondansetron (ZOFRAN-ODT) disintegrating tablet 4 mg    Follow-up: Return in about 2 weeks (around 03/16/2020) for 2-3 weeks exam and fastingf labs.    Kerin Perna, NP

## 2020-03-03 DIAGNOSIS — G894 Chronic pain syndrome: Secondary | ICD-10-CM | POA: Diagnosis not present

## 2020-03-03 DIAGNOSIS — R208 Other disturbances of skin sensation: Secondary | ICD-10-CM | POA: Diagnosis not present

## 2020-03-03 DIAGNOSIS — M79605 Pain in left leg: Secondary | ICD-10-CM | POA: Diagnosis not present

## 2020-03-03 DIAGNOSIS — M79672 Pain in left foot: Secondary | ICD-10-CM | POA: Diagnosis not present

## 2020-03-03 DIAGNOSIS — J3089 Other allergic rhinitis: Secondary | ICD-10-CM | POA: Diagnosis not present

## 2020-03-03 DIAGNOSIS — M79671 Pain in right foot: Secondary | ICD-10-CM | POA: Diagnosis not present

## 2020-03-03 DIAGNOSIS — J301 Allergic rhinitis due to pollen: Secondary | ICD-10-CM | POA: Diagnosis not present

## 2020-03-03 DIAGNOSIS — M25571 Pain in right ankle and joints of right foot: Secondary | ICD-10-CM | POA: Diagnosis not present

## 2020-03-03 DIAGNOSIS — J309 Allergic rhinitis, unspecified: Secondary | ICD-10-CM | POA: Diagnosis not present

## 2020-03-03 DIAGNOSIS — J3081 Allergic rhinitis due to animal (cat) (dog) hair and dander: Secondary | ICD-10-CM | POA: Diagnosis not present

## 2020-03-03 DIAGNOSIS — M25572 Pain in left ankle and joints of left foot: Secondary | ICD-10-CM | POA: Diagnosis not present

## 2020-03-03 DIAGNOSIS — M79604 Pain in right leg: Secondary | ICD-10-CM | POA: Diagnosis not present

## 2020-03-05 ENCOUNTER — Other Ambulatory Visit: Payer: Self-pay

## 2020-03-05 ENCOUNTER — Encounter (HOSPITAL_COMMUNITY)
Admission: RE | Admit: 2020-03-05 | Discharge: 2020-03-05 | Disposition: A | Discharge status: Home / Self Care | Payer: Medicaid Other | Source: Ambulatory Visit | Attending: Nephrology | Admitting: Nephrology

## 2020-03-05 VITALS — BP 117/58 | HR 74 | Temp 97.5°F | Resp 18

## 2020-03-05 DIAGNOSIS — N170 Acute kidney failure with tubular necrosis: Secondary | ICD-10-CM

## 2020-03-05 DIAGNOSIS — N185 Chronic kidney disease, stage 5: Secondary | ICD-10-CM | POA: Insufficient documentation

## 2020-03-05 LAB — IRON AND TIBC
Iron: 72 ug/dL (ref 45–182)
Saturation Ratios: 32 % (ref 17.9–39.5)
TIBC: 224 ug/dL — ABNORMAL LOW (ref 250–450)
UIBC: 152 ug/dL

## 2020-03-05 LAB — FERRITIN: Ferritin: 188 ng/mL (ref 24–336)

## 2020-03-05 LAB — POCT HEMOGLOBIN-HEMACUE: Hemoglobin: 9.4 g/dL — ABNORMAL LOW (ref 13.0–17.0)

## 2020-03-05 MED ORDER — EPOETIN ALFA 10000 UNIT/ML IJ SOLN: 10000.0000 [IU] | INTRAMUSCULAR | Status: DC

## 2020-03-05 MED ORDER — EPOETIN ALFA-EPBX 10000 UNIT/ML IJ SOLN
INTRAMUSCULAR | Status: AC
  Administered 2020-03-05: 10000 [IU] via SUBCUTANEOUS
  Filled 2020-03-05: qty 1

## 2020-03-10 ENCOUNTER — Other Ambulatory Visit: Payer: Self-pay

## 2020-03-10 ENCOUNTER — Ambulatory Visit (INDEPENDENT_AMBULATORY_CARE_PROVIDER_SITE_OTHER): Payer: Medicaid Other | Admitting: Physician Assistant

## 2020-03-10 ENCOUNTER — Ambulatory Visit (HOSPITAL_COMMUNITY)
Admission: RE | Admit: 2020-03-10 | Discharge: 2020-03-10 | Disposition: A | Discharge status: Home / Self Care | Payer: Medicaid Other | Source: Ambulatory Visit | Attending: Vascular Surgery | Admitting: Vascular Surgery

## 2020-03-10 ENCOUNTER — Other Ambulatory Visit (HOSPITAL_COMMUNITY): Payer: Self-pay | Admitting: Vascular Surgery

## 2020-03-10 VITALS — BP 132/72 | HR 80 | Temp 98.6°F | Resp 20 | Ht 73.0 in | Wt 198.6 lb

## 2020-03-10 DIAGNOSIS — R52 Pain, unspecified: Secondary | ICD-10-CM | POA: Insufficient documentation

## 2020-03-10 DIAGNOSIS — N185 Chronic kidney disease, stage 5: Secondary | ICD-10-CM | POA: Diagnosis not present

## 2020-03-10 NOTE — Progress Notes (Signed)
Established Dialysis Access   History of Present Illness   Cody Webster is a 54 y.o. (1966/04/16) male who presents for re-evaluation of permanent access.  He is status post left brachiocephalic fistula creation by Dr. Scot Dock in June 2018.  He is not yet been started on hemodialysis.  He was referred back to the office for evaluation of readiness of left arm fistula as he is approaching initiation of hemodialysis.  Patient states he had a pain and soreness in his fistula about 4 to 6 weeks ago.  He no longer feels any flow through the fistula.  He is frustrated that his kidneys are failing and that he may require hemodialysis soon.    The patient's PMH, PSH, SH, and FamHx were reviewed and are unchanged from prior visit.  Current Outpatient Medications  Medication Sig Dispense Refill  . acetaminophen (TYLENOL) 500 MG tablet Take 1,000 mg by mouth every 6 (six) hours as needed (for pain/headaches.).    Marland Kitchen aspirin EC 81 MG tablet Take 81 mg by mouth daily.    Marland Kitchen atorvastatin (LIPITOR) 80 MG tablet Take 1 tablet (80 mg total) by mouth daily at 6 PM. 90 tablet 3  . buPROPion (WELLBUTRIN XL) 150 MG 24 hr tablet Take 1 tablet (150 mg total) by mouth daily. 90 tablet 1  . calcitRIOL (ROCALTROL) 0.5 MCG capsule Take 0.5 mcg by mouth daily.    . carvedilol (COREG) 25 MG tablet Take 1 tablet (25 mg total) by mouth 2 (two) times daily with a meal. Reported on 05/27/2015 180 tablet 3  . cetirizine (ZYRTEC) 10 MG tablet Take 1 tablet (10 mg total) by mouth daily. 90 tablet 6  . cloNIDine (CATAPRES) 0.1 MG tablet Take 1 tablet (0.1 mg total) by mouth 3 (three) times daily. 270 tablet 3  . fluticasone (FLONASE) 50 MCG/ACT nasal spray Place 2 sprays into both nostrils daily. 16 g 6  . gabapentin (NEURONTIN) 300 MG capsule TAKE 1 CAPSULE (300 MG TOTAL) BY MOUTH 2 (TWO) TIMES DAILY. 60 capsule 3  . hydrALAZINE (APRESOLINE) 50 MG tablet TAKE 2 TABLETS (100 MG TOTAL) BY MOUTH 3 (THREE) TIMES DAILY. 180 tablet 2   . ISOPTO ATROPINE 1 % ophthalmic solution Place 1 drop into the right eye daily.    . nitroGLYCERIN (NITROSTAT) 0.4 MG SL tablet Place 1 tablet (0.4 mg total) under the tongue every 5 (five) minutes as needed for chest pain (CP or SOB). 30 tablet 0  . oxyCODONE-acetaminophen (PERCOCET) 10-325 MG tablet Take 1 tablet by mouth every 6 (six) hours as needed. For pain. 8 tablet 0  . rOPINIRole (REQUIP) 0.25 MG tablet Take 1 tablet (0.25 mg total) by mouth at bedtime. 90 tablet 0  . traZODone (DESYREL) 100 MG tablet Take 1 tablet (100 mg total) by mouth at bedtime as needed for sleep. Take 1/2 to 1 tablet at bedtime for insomnia 90 tablet 1   No current facility-administered medications for this visit.    REVIEW OF SYSTEMS (negative unless checked):   Cardiac:  []  Chest pain or chest pressure? []  Shortness of breath upon activity? []  Shortness of breath when lying flat? []  Irregular heart rhythm?  Vascular:  []  Pain in calf, thigh, or hip brought on by walking? []  Pain in feet at night that wakes you up from your sleep? []  Blood clot in your veins? []  Leg swelling?  Pulmonary:  []  Oxygen at home? []  Productive cough? []  Wheezing?  Neurologic:  []  Sudden weakness in  arms or legs? []  Sudden numbness in arms or legs? []  Sudden onset of difficult speaking or slurred speech? []  Temporary loss of vision in one eye? []  Problems with dizziness?  Gastrointestinal:  []  Blood in stool? []  Vomited blood?  Genitourinary:  []  Burning when urinating? []  Blood in urine?  Psychiatric:  []  Major depression  Hematologic:  []  Bleeding problems? []  Problems with blood clotting?  Dermatologic:  []  Rashes or ulcers?  Constitutional:  []  Fever or chills?  Ear/Nose/Throat:  []  Change in hearing? []  Nose bleeds? []  Sore throat?  Musculoskeletal:  []  Back pain? []  Joint pain? []  Muscle pain?   Physical Examination   Vitals:   03/10/20 1028  BP: 132/72  Pulse: 80  Resp: 20    Temp: 98.6 F (37 C)  TempSrc: Temporal  SpO2: 100%  Weight: 198 lb 9.6 oz (90.1 kg)  Height: 6\' 1"  (1.854 m)   Body mass index is 26.2 kg/m.  General:  WDWN in NAD; vital signs documented above Gait: Not observed HENT: WNL, normocephalic Pulmonary: normal non-labored breathing , without Rales, rhonchi,  wheezing Cardiac: regular HR Abdomen: soft, NT, no masses Skin: without rashes Vascular Exam/Pulses:  Right Left  Radial 2+ (normal) 2+ (normal)   Extremities: Palpable stump of left brachiocephalic fistula; large clot burden near anastomosis firm with no palpable flow beyond clot Musculoskeletal: no muscle wasting or atrophy  Neurologic: A&O X 3;  No focal weakness or paresthesias are detected Psychiatric:  The pt has Normal affect.   Non-invasive Vascular Imaging   BUE Vein Mapping  :   R arm: no acceptable vein conduits  L arm: acceptable vein conduits include basilic    Medical Decision Making   Cody Webster is a 54 y.o. male who presents for evaluation of left brachiocephalic fistula   On exam left brachiocephalic fistula has clotted and does not feel salvageable based on palpable clot burden in distal upper arm  Vein mapping was repeated today in the office  Plan will be for left basilic vein fistula creation in the next week or two; I discussed this case with Dr. Moshe Cipro who will review patient's chart and notify our office if he will also require [facility] placement Risk, benefits, and alternatives to access surgery were discussed.   The patient is aware the risks include but are not limited to: bleeding, infection, steal syndrome, nerve damage, thrombosis, failure to mature, and need for additional procedures.   The patient agrees to proceed with the procedure.   Cody Ligas PA-C [facility] [place] Office: [phone]  Clinic MD: Cody Webster

## 2020-03-10 NOTE — H&P (View-Only) (Signed)
Established Dialysis Access   History of Present Illness   Cody Webster is a 54 y.o. (11/21/1965) male who presents for re-evaluation of permanent access.  He is status post left brachiocephalic fistula creation by Dr. Scot Dock in June 2018.  He is not yet been started on hemodialysis.  He was referred back to the office for evaluation of readiness of left arm fistula as he is approaching initiation of hemodialysis.  Patient states he had a pain and soreness in his fistula about 4 to 6 weeks ago.  He no longer feels any flow through the fistula.  He is frustrated that his kidneys are failing and that he may require hemodialysis soon.    The patient's PMH, PSH, SH, and FamHx were reviewed and are unchanged from prior visit.  Current Outpatient Medications  Medication Sig Dispense Refill  . acetaminophen (TYLENOL) 500 MG tablet Take 1,000 mg by mouth every 6 (six) hours as needed (for pain/headaches.).    Marland Kitchen aspirin EC 81 MG tablet Take 81 mg by mouth daily.    Marland Kitchen atorvastatin (LIPITOR) 80 MG tablet Take 1 tablet (80 mg total) by mouth daily at 6 PM. 90 tablet 3  . buPROPion (WELLBUTRIN XL) 150 MG 24 hr tablet Take 1 tablet (150 mg total) by mouth daily. 90 tablet 1  . calcitRIOL (ROCALTROL) 0.5 MCG capsule Take 0.5 mcg by mouth daily.    . carvedilol (COREG) 25 MG tablet Take 1 tablet (25 mg total) by mouth 2 (two) times daily with a meal. Reported on 05/27/2015 180 tablet 3  . cetirizine (ZYRTEC) 10 MG tablet Take 1 tablet (10 mg total) by mouth daily. 90 tablet 6  . cloNIDine (CATAPRES) 0.1 MG tablet Take 1 tablet (0.1 mg total) by mouth 3 (three) times daily. 270 tablet 3  . fluticasone (FLONASE) 50 MCG/ACT nasal spray Place 2 sprays into both nostrils daily. 16 g 6  . gabapentin (NEURONTIN) 300 MG capsule TAKE 1 CAPSULE (300 MG TOTAL) BY MOUTH 2 (TWO) TIMES DAILY. 60 capsule 3  . hydrALAZINE (APRESOLINE) 50 MG tablet TAKE 2 TABLETS (100 MG TOTAL) BY MOUTH 3 (THREE) TIMES DAILY. 180 tablet 2   . ISOPTO ATROPINE 1 % ophthalmic solution Place 1 drop into the right eye daily.    . nitroGLYCERIN (NITROSTAT) 0.4 MG SL tablet Place 1 tablet (0.4 mg total) under the tongue every 5 (five) minutes as needed for chest pain (CP or SOB). 30 tablet 0  . oxyCODONE-acetaminophen (PERCOCET) 10-325 MG tablet Take 1 tablet by mouth every 6 (six) hours as needed. For pain. 8 tablet 0  . rOPINIRole (REQUIP) 0.25 MG tablet Take 1 tablet (0.25 mg total) by mouth at bedtime. 90 tablet 0  . traZODone (DESYREL) 100 MG tablet Take 1 tablet (100 mg total) by mouth at bedtime as needed for sleep. Take 1/2 to 1 tablet at bedtime for insomnia 90 tablet 1   No current facility-administered medications for this visit.    REVIEW OF SYSTEMS (negative unless checked):   Cardiac:  []  Chest pain or chest pressure? []  Shortness of breath upon activity? []  Shortness of breath when lying flat? []  Irregular heart rhythm?  Vascular:  []  Pain in calf, thigh, or hip brought on by walking? []  Pain in feet at night that wakes you up from your sleep? []  Blood clot in your veins? []  Leg swelling?  Pulmonary:  []  Oxygen at home? []  Productive cough? []  Wheezing?  Neurologic:  []  Sudden weakness in  arms or legs? []  Sudden numbness in arms or legs? []  Sudden onset of difficult speaking or slurred speech? []  Temporary loss of vision in one eye? []  Problems with dizziness?  Gastrointestinal:  []  Blood in stool? []  Vomited blood?  Genitourinary:  []  Burning when urinating? []  Blood in urine?  Psychiatric:  []  Major depression  Hematologic:  []  Bleeding problems? []  Problems with blood clotting?  Dermatologic:  []  Rashes or ulcers?  Constitutional:  []  Fever or chills?  Ear/Nose/Throat:  []  Change in hearing? []  Nose bleeds? []  Sore throat?  Musculoskeletal:  []  Back pain? []  Joint pain? []  Muscle pain?   Physical Examination   Vitals:   03/10/20 1028  BP: 132/72  Pulse: 80  Resp: 20    Temp: 98.6 F (37 C)  TempSrc: Temporal  SpO2: 100%  Weight: 198 lb 9.6 oz (90.1 kg)  Height: 6\' 1"  (1.854 m)   Body mass index is 26.2 kg/m.  General:  WDWN in NAD; vital signs documented above Gait: Not observed HENT: WNL, normocephalic Pulmonary: normal non-labored breathing , without Rales, rhonchi,  wheezing Cardiac: regular HR Abdomen: soft, NT, no masses Skin: without rashes Vascular Exam/Pulses:  Right Left  Radial 2+ (normal) 2+ (normal)   Extremities: Palpable stump of left brachiocephalic fistula; large clot burden near anastomosis firm with no palpable flow beyond clot Musculoskeletal: no muscle wasting or atrophy  Neurologic: A&O X 3;  No focal weakness or paresthesias are detected Psychiatric:  The pt has Normal affect.   Non-invasive Vascular Imaging   BUE Vein Mapping  :   R arm: no acceptable vein conduits  L arm: acceptable vein conduits include basilic    Medical Decision Making   Shown Dissinger is a 54 y.o. male who presents for evaluation of left brachiocephalic fistula   On exam left brachiocephalic fistula has clotted and does not feel salvageable based on palpable clot burden in distal upper arm  Vein mapping was repeated today in the office  Plan will be for left basilic vein fistula creation in the next week or two; I discussed this case with Dr. Moshe Cipro who will review patient's chart and notify our office if he will also require [facility] placement Risk, benefits, and alternatives to access surgery were discussed.   The patient is aware the risks include but are not limited to: bleeding, infection, steal syndrome, nerve damage, thrombosis, failure to mature, and need for additional procedures.   The patient agrees to proceed with the procedure.   Dagoberto Ligas PA-C [facility] [place] Office: [phone]  Clinic MD: Carlis Abbott

## 2020-03-19 ENCOUNTER — Inpatient Hospital Stay (HOSPITAL_COMMUNITY)
Admission: RE | Admit: 2020-03-19 | Discharge: 2020-03-19 | Disposition: A | Discharge status: Home / Self Care | Payer: Medicaid Other | Source: Ambulatory Visit | Attending: Nephrology | Admitting: Nephrology

## 2020-03-20 ENCOUNTER — Other Ambulatory Visit (HOSPITAL_COMMUNITY)
Admission: RE | Admit: 2020-03-20 | Discharge: 2020-03-20 | Disposition: A | Discharge status: Home / Self Care | Payer: Medicaid Other | Source: Ambulatory Visit | Attending: Vascular Surgery | Admitting: Vascular Surgery

## 2020-03-20 ENCOUNTER — Encounter (HOSPITAL_COMMUNITY): Payer: Self-pay | Admitting: Vascular Surgery

## 2020-03-20 ENCOUNTER — Other Ambulatory Visit: Payer: Self-pay

## 2020-03-20 DIAGNOSIS — Z20822 Contact with and (suspected) exposure to covid-19: Secondary | ICD-10-CM | POA: Diagnosis not present

## 2020-03-20 DIAGNOSIS — Z01812 Encounter for preprocedural laboratory examination: Secondary | ICD-10-CM | POA: Insufficient documentation

## 2020-03-20 LAB — SARS CORONAVIRUS 2 (TAT 6-24 HRS): SARS Coronavirus 2: NEGATIVE

## 2020-03-20 NOTE — Progress Notes (Signed)
   03/20/20 1051  OBSTRUCTIVE SLEEP APNEA  Have you ever been diagnosed with sleep apnea through a sleep study? No  Do you snore loudly (loud enough to be heard through closed doors)?  1  Do you often feel tired, fatigued, or sleepy during the daytime (such as falling asleep during driving or talking to someone)? 1  Has anyone observed you stop breathing during your sleep? 0  Do you have, or are you being treated for high blood pressure? 1  BMI more than 35 kg/m2? 0  Age > 48 (1-yes) 1  Male Gender (Yes=1) 1  Obstructive Sleep Apnea Score 5  Score 5 or greater  Results sent to PCP

## 2020-03-20 NOTE — Progress Notes (Signed)
Anesthesia Chart Review: Same day workup  Patient had recent cardiac testing at [facility] as part of evaluation for renal transplant.  Echo 5/21 showed moderately decreased systolic function with LVEF 40%, mild MR.  Nuclear stress 5/21 showed no definitive evidence of ischemia, small, mild, completely reversible defect involving the apical lateral and mid anterolateral segments consistent with probable artifact but cannot be ruled out mild ischemia.  Full results below.  He has not had cardiology follow-up since that testing. Last cardiology visit was with Dr. Ellyn Hack 05/20/19, per note, "He may be class I CHF symptoms, but difficult to assess because of his deconditioning and limited mobility... Most recent evaluation in 2019 showed relatively normal echocardiogram and on further review of relatively negative/nonischemic stress test in the absence of any symptoms.  I do suspect that as part of his transplant evaluation, he would potentially need another ischemic evaluation.  However he is currently not actively having symptoms and I therefore would not check at this point.  Would be reluctant to perform until we are at the stage where he is actually going to be needed having transplant or on dialysis, because with his baseline renal dysfunction, would probably not tolerate any contrast load from heart catheterization if there is abnormal findings.  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 Moshe Cipro.  In 2018 he had a left brachiocephalic fistula created by Dr. Scot Dock in anticipation of future HD (he has not yet required initiation of HD).  This is now clotted and new access is required.  Will need DOS labs and eval.  EKG 10/03/19: Sinus rhythm. Rate 80. Probable left atrial enlargement. Probable left ventricular hypertrophy. Nonspecific T abnormalities, lateral leads. Non-specific  ST-t changes  TTE 10/09/2019 (Care Everywhere): Summary  1. The left ventricle is upper normal in size with moderately increased wall  thickness.  2. The left ventricular systolic function is moderately decreased, LVEF is  visually estimated at 40%.  3. There is mild mitral valve regurgitation.  4. The aortic valve is trileaflet with mildly thickened leaflets with mildly  reduced excursion.  5. The left atrium is moderately dilated in size.  6. The right ventricle is normal in size, with normal systolic function.    Nuclear stress 10/01/2019 (Care Everywhere): Impressions:  - No definitive evidence of ischemia.  - There is a small in size, mild in severity, completely reversible defect involving the apical lateral and mid anterolateral segments. This is consistent with probable artifact but cannot rule out mild ischemia.  - Post stress: Global systolic function is normal. The ejection fraction calculated at 55%.  - Coronary calcifications are noted    Wynonia Musty [facility] Short Stay Center/Anesthesiology Phone [phone] 03/20/2020 1:11 PM

## 2020-03-20 NOTE — Anesthesia Preprocedure Evaluation (Addendum)
Anesthesia Evaluation  Patient identified by MRN, date of birth, ID band Patient awake    Reviewed: Allergy & Precautions, NPO status , Patient's Chart, lab work & pertinent test results  Airway Mallampati: II  TM Distance: >3 FB Neck ROM: Full    Dental  (+) Dental Advisory Given   Pulmonary sleep apnea , Current Smoker and Patient abstained from smoking.,    breath sounds clear to auscultation       Cardiovascular hypertension, Pt. on medications +CHF   Rhythm:Regular Rate:Normal     Neuro/Psych Seizures -,   Neuromuscular disease    GI/Hepatic Neg liver ROS, GERD  ,  Endo/Other  diabetes, Type 2  Renal/GU CRFRenal disease     Musculoskeletal   Abdominal   Peds  Hematology  (+) anemia ,   Anesthesia Other Findings   Reproductive/Obstetrics                            Anesthesia Physical Anesthesia Plan  ASA: III  Anesthesia Plan: General   Post-op Pain Management:    Induction: Intravenous  PONV Risk Score and Plan: 1 and Dexamethasone, Ondansetron and Treatment may vary due to age or medical condition  Airway Management Planned: LMA  Additional Equipment: None  Intra-op Plan:   Post-operative Plan: Extubation in OR  Informed Consent: I have reviewed the patients History and Physical, chart, labs and discussed the procedure including the risks, benefits and alternatives for the proposed anesthesia with the patient or authorized representative who has indicated his/her understanding and acceptance.     Dental advisory given  Plan Discussed with: CRNA  Anesthesia Plan Comments:         Anesthesia Quick Evaluation

## 2020-03-20 NOTE — Progress Notes (Signed)
Spoke with pt for pre-op call. Pt states he's been told he had CHF but states he's also told that he does not have it. Pt denies any other cardiac hx. Pt is a type 2 diabetic. He states he has not been on medications for 8 months. Last A1C was 4.7 on 09/25/19.   Covid test done today.  Pt states he's been in quarantine since the test was done and will stay in quarantine until he comes to the hospital on Monday.  Chart sent to Anesthesia PA for review.

## 2020-03-23 ENCOUNTER — Ambulatory Visit (HOSPITAL_COMMUNITY)
Admission: RE | Admit: 2020-03-23 | Discharge: 2020-03-23 | Disposition: A | Discharge status: Home / Self Care | Payer: Medicaid Other | Attending: Vascular Surgery | Admitting: Vascular Surgery

## 2020-03-23 ENCOUNTER — Encounter (HOSPITAL_COMMUNITY): Payer: Self-pay | Admitting: Vascular Surgery

## 2020-03-23 ENCOUNTER — Encounter (HOSPITAL_COMMUNITY)
Admission: RE | Disposition: A | Discharge status: Home / Self Care | Payer: Self-pay | Source: Home / Self Care | Attending: Vascular Surgery

## 2020-03-23 ENCOUNTER — Other Ambulatory Visit: Payer: Self-pay

## 2020-03-23 ENCOUNTER — Ambulatory Visit (HOSPITAL_COMMUNITY): Payer: Medicaid Other | Admitting: Physician Assistant

## 2020-03-23 DIAGNOSIS — I509 Heart failure, unspecified: Secondary | ICD-10-CM | POA: Diagnosis not present

## 2020-03-23 DIAGNOSIS — N185 Chronic kidney disease, stage 5: Secondary | ICD-10-CM | POA: Diagnosis not present

## 2020-03-23 DIAGNOSIS — X58XXXA Exposure to other specified factors, initial encounter: Secondary | ICD-10-CM | POA: Insufficient documentation

## 2020-03-23 DIAGNOSIS — Z7982 Long term (current) use of aspirin: Secondary | ICD-10-CM | POA: Diagnosis not present

## 2020-03-23 DIAGNOSIS — I2510 Atherosclerotic heart disease of native coronary artery without angina pectoris: Secondary | ICD-10-CM | POA: Diagnosis not present

## 2020-03-23 DIAGNOSIS — E1122 Type 2 diabetes mellitus with diabetic chronic kidney disease: Secondary | ICD-10-CM | POA: Diagnosis not present

## 2020-03-23 DIAGNOSIS — D631 Anemia in chronic kidney disease: Secondary | ICD-10-CM | POA: Diagnosis not present

## 2020-03-23 DIAGNOSIS — I132 Hypertensive heart and chronic kidney disease with heart failure and with stage 5 chronic kidney disease, or end stage renal disease: Secondary | ICD-10-CM | POA: Diagnosis not present

## 2020-03-23 DIAGNOSIS — T82898A Other specified complication of vascular prosthetic devices, implants and grafts, initial encounter: Secondary | ICD-10-CM | POA: Insufficient documentation

## 2020-03-23 DIAGNOSIS — Z79899 Other long term (current) drug therapy: Secondary | ICD-10-CM | POA: Insufficient documentation

## 2020-03-23 DIAGNOSIS — N179 Acute kidney failure, unspecified: Secondary | ICD-10-CM | POA: Diagnosis not present

## 2020-03-23 DIAGNOSIS — F17200 Nicotine dependence, unspecified, uncomplicated: Secondary | ICD-10-CM | POA: Insufficient documentation

## 2020-03-23 DIAGNOSIS — I5032 Chronic diastolic (congestive) heart failure: Secondary | ICD-10-CM | POA: Diagnosis not present

## 2020-03-23 HISTORY — DX: Anemia, unspecified: D64.9

## 2020-03-23 HISTORY — PX: AV FISTULA PLACEMENT: SHX1204

## 2020-03-23 HISTORY — DX: Post-traumatic stress disorder, unspecified: F43.10

## 2020-03-23 LAB — POCT I-STAT, CHEM 8
BUN: 49 mg/dL — ABNORMAL HIGH (ref 6–20)
Calcium, Ion: 1.19 mmol/L (ref 1.15–1.40)
Chloride: 113 mmol/L — ABNORMAL HIGH (ref 98–111)
Creatinine, Ser: 9.2 mg/dL — ABNORMAL HIGH (ref 0.61–1.24)
Glucose, Bld: 89 mg/dL (ref 70–99)
HCT: 30 % — ABNORMAL LOW (ref 39.0–52.0)
Hemoglobin: 10.2 g/dL — ABNORMAL LOW (ref 13.0–17.0)
Potassium: 4.5 mmol/L (ref 3.5–5.1)
Sodium: 142 mmol/L (ref 135–145)
TCO2: 17 mmol/L — ABNORMAL LOW (ref 22–32)

## 2020-03-23 LAB — SURGICAL PCR SCREEN
MRSA, PCR: NEGATIVE
Staphylococcus aureus: NEGATIVE

## 2020-03-23 LAB — GLUCOSE, CAPILLARY
Glucose-Capillary: 98 mg/dL (ref 70–99)
Glucose-Capillary: 96 mg/dL (ref 70–99)

## 2020-03-23 SURGERY — ARTERIOVENOUS (AV) FISTULA CREATION
Anesthesia: General | Site: Arm Upper | Laterality: Left

## 2020-03-23 MED ORDER — ONDANSETRON HCL 4 MG/2ML IJ SOLN
INTRAMUSCULAR | Status: AC
  Filled 2020-03-23: qty 2

## 2020-03-23 MED ORDER — SODIUM CHLORIDE 0.9 % IV SOLN: INTRAVENOUS | Status: DC

## 2020-03-23 MED ORDER — 0.9 % SODIUM CHLORIDE (POUR BTL) OPTIME
TOPICAL | Status: DC | PRN
  Administered 2020-03-23: 1000 mL

## 2020-03-23 MED ORDER — EPHEDRINE SULFATE-NACL 50-0.9 MG/10ML-% IV SOSY
PREFILLED_SYRINGE | INTRAVENOUS | Status: DC | PRN
  Administered 2020-03-23: 5 mg via INTRAVENOUS

## 2020-03-23 MED ORDER — TRAMADOL HCL 50 MG PO TABS
50.0000 mg | ORAL_TABLET | Freq: Four times a day (QID) | ORAL | 0 refills | Status: DC | PRN
Start: 2020-03-23 — End: 2020-05-27

## 2020-03-23 MED ORDER — CHLORHEXIDINE GLUCONATE 0.12 % MT SOLN
OROMUCOSAL | Status: AC
  Administered 2020-03-23: 15 mL via OROMUCOSAL
  Filled 2020-03-23: qty 15

## 2020-03-23 MED ORDER — ONDANSETRON HCL 4 MG/2ML IJ SOLN
INTRAMUSCULAR | Status: DC | PRN
  Administered 2020-03-23: 4 mg via INTRAVENOUS

## 2020-03-23 MED ORDER — DEXAMETHASONE SODIUM PHOSPHATE 10 MG/ML IJ SOLN
INTRAMUSCULAR | Status: AC
  Filled 2020-03-23: qty 1

## 2020-03-23 MED ORDER — FENTANYL CITRATE (PF) 250 MCG/5ML IJ SOLN
INTRAMUSCULAR | Status: DC | PRN
  Administered 2020-03-23: 50 ug via INTRAVENOUS

## 2020-03-23 MED ORDER — MIDAZOLAM HCL 2 MG/2ML IJ SOLN
INTRAMUSCULAR | Status: DC | PRN
  Administered 2020-03-23: 2 mg via INTRAVENOUS

## 2020-03-23 MED ORDER — PROTAMINE SULFATE 10 MG/ML IV SOLN
INTRAVENOUS | Status: AC
  Filled 2020-03-23: qty 5

## 2020-03-23 MED ORDER — PHENYLEPHRINE HCL-NACL 10-0.9 MG/250ML-% IV SOLN
INTRAVENOUS | Status: DC | PRN
  Administered 2020-03-23: 20 ug/min via INTRAVENOUS

## 2020-03-23 MED ORDER — CHLORHEXIDINE GLUCONATE 4 % EX LIQD: 60.0000 mL | Freq: Once | CUTANEOUS | Status: DC

## 2020-03-23 MED ORDER — PROPOFOL 10 MG/ML IV BOLUS
INTRAVENOUS | Status: DC | PRN
  Administered 2020-03-23: 150 mg via INTRAVENOUS

## 2020-03-23 MED ORDER — EPHEDRINE 5 MG/ML INJ
INTRAVENOUS | Status: AC
  Filled 2020-03-23: qty 10

## 2020-03-23 MED ORDER — CHLORHEXIDINE GLUCONATE 0.12 % MT SOLN: 15.0000 mL | Freq: Once | OROMUCOSAL | Status: AC

## 2020-03-23 MED ORDER — FENTANYL CITRATE (PF) 250 MCG/5ML IJ SOLN
INTRAMUSCULAR | Status: AC
  Filled 2020-03-23: qty 5

## 2020-03-23 MED ORDER — DEXAMETHASONE SODIUM PHOSPHATE 10 MG/ML IJ SOLN
INTRAMUSCULAR | Status: DC | PRN
  Administered 2020-03-23: 4 mg via INTRAVENOUS

## 2020-03-23 MED ORDER — HEPARIN SODIUM (PORCINE) 1000 UNIT/ML IJ SOLN
INTRAMUSCULAR | Status: AC
  Filled 2020-03-23: qty 1

## 2020-03-23 MED ORDER — LIDOCAINE-EPINEPHRINE (PF) 1 %-1:200000 IJ SOLN
INTRAMUSCULAR | Status: DC | PRN
  Administered 2020-03-23: 5 mL

## 2020-03-23 MED ORDER — LIDOCAINE 2% (20 MG/ML) 5 ML SYRINGE
INTRAMUSCULAR | Status: DC | PRN
  Administered 2020-03-23: 20 mg via INTRAVENOUS

## 2020-03-23 MED ORDER — PHENYLEPHRINE 40 MCG/ML (10ML) SYRINGE FOR IV PUSH (FOR BLOOD PRESSURE SUPPORT)
PREFILLED_SYRINGE | INTRAVENOUS | Status: AC
  Filled 2020-03-23: qty 10

## 2020-03-23 MED ORDER — PROPOFOL 10 MG/ML IV BOLUS
INTRAVENOUS | Status: AC
  Filled 2020-03-23: qty 20

## 2020-03-23 MED ORDER — CEFAZOLIN SODIUM-DEXTROSE 2-4 GM/100ML-% IV SOLN
2.0000 g | INTRAVENOUS | Status: AC
  Administered 2020-03-23: 2 g via INTRAVENOUS
  Filled 2020-03-23: qty 100

## 2020-03-23 MED ORDER — MIDAZOLAM HCL 2 MG/2ML IJ SOLN
INTRAMUSCULAR | Status: AC
  Filled 2020-03-23: qty 2

## 2020-03-23 MED ORDER — ACETAMINOPHEN 500 MG PO TABS
1000.0000 mg | ORAL_TABLET | Freq: Once | ORAL | Status: AC
  Administered 2020-03-23: 1000 mg via ORAL
  Filled 2020-03-23: qty 2

## 2020-03-23 MED ORDER — LIDOCAINE 2% (20 MG/ML) 5 ML SYRINGE
INTRAMUSCULAR | Status: AC
  Filled 2020-03-23: qty 5

## 2020-03-23 MED ORDER — PROTAMINE SULFATE 10 MG/ML IV SOLN
INTRAVENOUS | Status: DC | PRN
  Administered 2020-03-23 (×4): 10 mg via INTRAVENOUS

## 2020-03-23 MED ORDER — FENTANYL CITRATE (PF) 100 MCG/2ML IJ SOLN: 25.0000 ug | INTRAMUSCULAR | Status: DC | PRN

## 2020-03-23 MED ORDER — SODIUM CHLORIDE 0.9 % IV SOLN
INTRAVENOUS | Status: DC | PRN
  Administered 2020-03-23: 500 mL

## 2020-03-23 MED ORDER — HEPARIN SODIUM (PORCINE) 1000 UNIT/ML IJ SOLN
INTRAMUSCULAR | Status: DC | PRN
  Administered 2020-03-23: 8000 [IU] via INTRAVENOUS

## 2020-03-23 SURGICAL SUPPLY — 31 items
ARMBAND PINK RESTRICT EXTREMIT (MISCELLANEOUS) ×6 IMPLANT
CANISTER SUCT 3000ML PPV (MISCELLANEOUS) ×3 IMPLANT
CANNULA VESSEL 3MM 2 BLNT TIP (CANNULA) ×3 IMPLANT
CLIP VESOCCLUDE MED 6/CT (CLIP) ×3 IMPLANT
CLIP VESOCCLUDE SM WIDE 6/CT (CLIP) ×3 IMPLANT
COVER PROBE W GEL 5X96 (DRAPES) IMPLANT
COVER WAND RF STERILE (DRAPES) ×3 IMPLANT
DECANTER SPIKE VIAL GLASS SM (MISCELLANEOUS) ×3 IMPLANT
DERMABOND ADVANCED (GAUZE/BANDAGES/DRESSINGS) ×2
DERMABOND ADVANCED .7 DNX12 (GAUZE/BANDAGES/DRESSINGS) ×1 IMPLANT
ELECT REM PT RETURN 9FT ADLT (ELECTROSURGICAL) ×3
ELECTRODE REM PT RTRN 9FT ADLT (ELECTROSURGICAL) ×1 IMPLANT
GLOVE BIO SURGEON STRL SZ7.5 (GLOVE) ×3 IMPLANT
GLOVE BIOGEL PI IND STRL 8 (GLOVE) ×1 IMPLANT
GLOVE BIOGEL PI INDICATOR 8 (GLOVE) ×2
GOWN STRL REUS W/ TWL LRG LVL3 (GOWN DISPOSABLE) ×3 IMPLANT
GOWN STRL REUS W/TWL LRG LVL3 (GOWN DISPOSABLE) ×6
KIT BASIN OR (CUSTOM PROCEDURE TRAY) ×3 IMPLANT
KIT TURNOVER KIT B (KITS) ×3 IMPLANT
NS IRRIG 1000ML POUR BTL (IV SOLUTION) ×3 IMPLANT
PACK CV ACCESS (CUSTOM PROCEDURE TRAY) ×3 IMPLANT
PAD ARMBOARD 7.5X6 YLW CONV (MISCELLANEOUS) ×6 IMPLANT
SPONGE SURGIFOAM ABS GEL 100 (HEMOSTASIS) IMPLANT
SUT PROLENE 6 0 BV (SUTURE) ×3 IMPLANT
SUT VIC AB 3-0 SH 27 (SUTURE) ×2
SUT VIC AB 3-0 SH 27X BRD (SUTURE) ×1 IMPLANT
SUT VICRYL 4-0 PS2 18IN ABS (SUTURE) ×3 IMPLANT
SYR TOOMEY 50ML (SYRINGE) ×3 IMPLANT
TOWEL GREEN STERILE (TOWEL DISPOSABLE) ×3 IMPLANT
UNDERPAD 30X36 HEAVY ABSORB (UNDERPADS AND DIAPERS) ×3 IMPLANT
WATER STERILE IRR 1000ML POUR (IV SOLUTION) ×3 IMPLANT

## 2020-03-23 NOTE — Anesthesia Postprocedure Evaluation (Signed)
Anesthesia Post Note  Patient: Cody Webster  Procedure(s) Performed: LEFT BASILIC VEIN FISTULA CREATION (Left Arm Upper)     Patient location during evaluation: PACU Anesthesia Type: General Level of consciousness: awake and alert Pain management: pain level controlled Vital Signs Assessment: post-procedure vital signs reviewed and stable Respiratory status: spontaneous breathing, nonlabored ventilation, respiratory function stable and patient connected to nasal cannula oxygen Cardiovascular status: blood pressure returned to baseline and stable Postop Assessment: no apparent nausea or vomiting Anesthetic complications: no   No complications documented.  Last Vitals:  Vitals:   03/23/20 0941 03/23/20 0956  BP: (!) 141/70 138/68  Pulse: 61 61  Resp: 18 12  Temp:  (!) 36.2 C  SpO2: 99% 98%    Last Pain:  Vitals:   03/23/20 0956  TempSrc:   PainSc: 0-No pain                 Tiajuana Amass

## 2020-03-23 NOTE — Interval H&P Note (Signed)
History and Physical Interval Note:  03/23/2020 7:09 AM  Cody Webster  has presented today for surgery, with the diagnosis of CHRONIC KIDNEY DISEASE STAGE V.  The various methods of treatment have been discussed with the patient and family. After consideration of risks, benefits and other options for treatment, the patient has consented to  Procedure(s): LEFT BASILIC VEIN FISTULA CREATION (Left) as a surgical intervention.  The patient's history has been reviewed, patient examined, no change in status, stable for surgery.  I have reviewed the patient's chart and labs.  Questions were answered to the patient's satisfaction.     [name]

## 2020-03-23 NOTE — Transfer of Care (Signed)
Immediate Anesthesia Transfer of Care Note  Patient: Mattew Chriswell  Procedure(s) Performed: LEFT BASILIC VEIN FISTULA CREATION (Left Arm Upper)  Patient Location: PACU  Anesthesia Type:General  Level of Consciousness: drowsy and patient cooperative  Airway & Oxygen Therapy: Patient Spontanous Breathing and Patient connected to face mask oxygen  Post-op Assessment: Report given to RN and Post -op Vital signs reviewed and stable  Post vital signs: Reviewed and stable  Last Vitals:  Vitals Value Taken Time  BP 138/65 03/23/20 0926  Temp 36.2 C 03/23/20 0926  Pulse 62 03/23/20 0929  Resp 14 03/23/20 0929  SpO2 100 % 03/23/20 0929  Vitals shown include unvalidated device data.  Last Pain:  Vitals:   03/23/20 0704  TempSrc:   PainSc: 8       Patients Stated Pain Goal: 6 (93/81/82 9937)  Complications: No complications documented.

## 2020-03-23 NOTE — Anesthesia Procedure Notes (Signed)
Procedure Name: LMA Insertion Date/Time: 03/23/2020 7:56 AM Performed by: Renato Shin, CRNA Pre-anesthesia Checklist: Patient identified, Emergency Drugs available and Suction available Patient Re-evaluated:Patient Re-evaluated prior to induction Oxygen Delivery Method: Circle system utilized Preoxygenation: Pre-oxygenation with 100% oxygen Induction Type: IV induction LMA: LMA inserted LMA Size: 5.0 Number of attempts: 1 Placement Confirmation: positive ETCO2 and breath sounds checked- equal and bilateral Tube secured with: Tape Dental Injury: Teeth and Oropharynx as per pre-operative assessment

## 2020-03-23 NOTE — Op Note (Signed)
    NAME: Cody Webster    MRN: [id number] DOB: [birth year]    DATE OF OPERATION: 03/23/2020  PREOP DIAGNOSIS:    Stage 5 CKD  POSTOP DIAGNOSIS:    Same  PROCEDURE:    First stage left basilic vein transposition  SURGEON: Judeth Cornfield. Scot Dock, MD  ASSIST: Darl Householder, RNFA  ANESTHESIA: General  EBL: Minimal  INDICATIONS:    Cody Webster is a 54 y.o. male who presents for new access.  He has an occluded left brachiocephalic fistula.  He was set up for basilic vein transposition  FINDINGS:   0-4/8 mm basilic vein.  TECHNIQUE:   The patient was taken to the operating room and received a general anesthetic.  The left arm was prepped and draped in usual sterile fashion.  I looked at the basilic vein myself with the SonoSite and I felt this looked reasonable.  A longitudinal incision was made over the vein and the vein was dissected free and separated from the nerve.  Branches were divided between clips and 3-0 silk ties.  The vein was ligated distally and irrigated up nicely with heparinized saline.  The fascia was opened over the brachial artery and the brachial artery was dissected free.  The patient was heparinized.  The brachial artery was clamped proximally distally and a longitudinal arteriotomy was made.  The vein was sewn end-to-side to the artery using continuous 6-0 Prolene suture.  I left the vein somewhat redundant in anticipation of the second stage.  Hemostasis was obtained in the wound.  There was a good radial and ulnar signal with the Doppler.  This augmented with compression of the fistula.  The wound was then closed with 2 deep layers of 3-0 Vicryl and the skin closed with 4-0 Vicryl.  Dermabond was applied.  The patient tolerated the procedure well was transferred to recovery room in stable condition.  All needle and sponge counts were correct.  Given the complexity of the case a first assistant was necessary in order to expedient the procedure and safely  perform the technical aspects of the operation.  [name], MD, FACS [facility]  DATE OF DICTATION:   03/23/2020

## 2020-03-23 NOTE — Progress Notes (Signed)
VASCULAR SURGERY:  This patient was seen by the physicians assistant on 03/10/2020 and set up for a left basilic vein transposition.  I have reviewed the vein map and it looks like the left basilic vein is marginal in size.  I explained that if I am able to perform a basilic vein transposition this would likely be done in 2 stages.  If the vein is not adequate I have explained that I will place an AV graft.  He is not on dialysis.  He tells me that he does not need to have a catheter placed.   [name], MD Office: [phone]

## 2020-03-24 ENCOUNTER — Encounter (HOSPITAL_COMMUNITY): Payer: Self-pay | Admitting: Vascular Surgery

## 2020-04-01 ENCOUNTER — Telehealth (INDEPENDENT_AMBULATORY_CARE_PROVIDER_SITE_OTHER): Payer: Self-pay | Admitting: Primary Care

## 2020-04-01 NOTE — Telephone Encounter (Signed)
Cody Webster with [employer]  is calling Cody Webster would like personal care service and the order needs to be on letterhead or prescription pad . Personal care service provided per treatment plan with code [phone] the member DX and the patient name , dob and the doctor signature. Please fax to [phone]

## 2020-04-02 ENCOUNTER — Encounter (HOSPITAL_COMMUNITY): Payer: Medicaid Other

## 2020-04-02 DIAGNOSIS — M79604 Pain in right leg: Secondary | ICD-10-CM | POA: Diagnosis not present

## 2020-04-02 DIAGNOSIS — G894 Chronic pain syndrome: Secondary | ICD-10-CM | POA: Diagnosis not present

## 2020-04-02 DIAGNOSIS — M545 Low back pain, unspecified: Secondary | ICD-10-CM | POA: Diagnosis not present

## 2020-04-02 DIAGNOSIS — J309 Allergic rhinitis, unspecified: Secondary | ICD-10-CM | POA: Diagnosis not present

## 2020-04-02 DIAGNOSIS — M79671 Pain in right foot: Secondary | ICD-10-CM | POA: Diagnosis not present

## 2020-04-02 DIAGNOSIS — J3081 Allergic rhinitis due to animal (cat) (dog) hair and dander: Secondary | ICD-10-CM | POA: Diagnosis not present

## 2020-04-02 DIAGNOSIS — M25571 Pain in right ankle and joints of right foot: Secondary | ICD-10-CM | POA: Diagnosis not present

## 2020-04-02 DIAGNOSIS — M79672 Pain in left foot: Secondary | ICD-10-CM | POA: Diagnosis not present

## 2020-04-02 DIAGNOSIS — R208 Other disturbances of skin sensation: Secondary | ICD-10-CM | POA: Diagnosis not present

## 2020-04-02 DIAGNOSIS — J301 Allergic rhinitis due to pollen: Secondary | ICD-10-CM | POA: Diagnosis not present

## 2020-04-02 DIAGNOSIS — M79605 Pain in left leg: Secondary | ICD-10-CM | POA: Diagnosis not present

## 2020-04-02 DIAGNOSIS — M25572 Pain in left ankle and joints of left foot: Secondary | ICD-10-CM | POA: Diagnosis not present

## 2020-04-08 DIAGNOSIS — N189 Chronic kidney disease, unspecified: Secondary | ICD-10-CM | POA: Diagnosis not present

## 2020-04-08 DIAGNOSIS — N2581 Secondary hyperparathyroidism of renal origin: Secondary | ICD-10-CM | POA: Diagnosis not present

## 2020-04-08 DIAGNOSIS — N185 Chronic kidney disease, stage 5: Secondary | ICD-10-CM | POA: Diagnosis not present

## 2020-04-08 NOTE — Telephone Encounter (Signed)
Order was faxed.

## 2020-04-13 NOTE — Telephone Encounter (Signed)
Refaxed order today  

## 2020-04-13 NOTE — Telephone Encounter (Signed)
Caryl Pina RN with [employer] is calling to state that she did not receive the orders. Can the orders be refaxed please? [phone]

## 2020-04-16 ENCOUNTER — Telehealth: Payer: Self-pay

## 2020-04-16 ENCOUNTER — Telehealth (INDEPENDENT_AMBULATORY_CARE_PROVIDER_SITE_OTHER): Payer: Self-pay | Admitting: Primary Care

## 2020-04-16 NOTE — Telephone Encounter (Signed)
Patient will call with pain management physicians who take his insurance

## 2020-04-16 NOTE — Telephone Encounter (Cosign Needed)
PT needs a referral for pain management because his insurance doesn't cover it.   Needs it before the 21st of Dec.   Please advise and thank you

## 2020-04-16 NOTE — Telephone Encounter (Signed)
Copied from [place] [id number]. Topic: General - Other >> Apr 16, 2020  2:40 PM Greggory Keen D wrote: Reason for CRM: Pt stated he was just in the office today and he was supposed to call back with another doctors name and phone number for [facility].  [place]   Please advice

## 2020-04-19 ENCOUNTER — Other Ambulatory Visit (INDEPENDENT_AMBULATORY_CARE_PROVIDER_SITE_OTHER): Payer: Self-pay | Admitting: Primary Care

## 2020-04-19 DIAGNOSIS — G894 Chronic pain syndrome: Secondary | ICD-10-CM

## 2020-04-21 ENCOUNTER — Other Ambulatory Visit: Payer: Self-pay

## 2020-04-21 ENCOUNTER — Other Ambulatory Visit (HOSPITAL_COMMUNITY): Payer: Self-pay | Admitting: *Deleted

## 2020-04-21 DIAGNOSIS — N185 Chronic kidney disease, stage 5: Secondary | ICD-10-CM

## 2020-04-22 ENCOUNTER — Ambulatory Visit (HOSPITAL_COMMUNITY)
Admission: RE | Admit: 2020-04-22 | Discharge: 2020-04-22 | Disposition: A | Discharge status: Home / Self Care | Payer: Medicaid Other | Source: Ambulatory Visit | Attending: Nephrology | Admitting: Nephrology

## 2020-04-22 ENCOUNTER — Other Ambulatory Visit: Payer: Self-pay

## 2020-04-22 VITALS — BP 142/63 | HR 75 | Temp 98.1°F | Resp 18

## 2020-04-22 DIAGNOSIS — N185 Chronic kidney disease, stage 5: Secondary | ICD-10-CM | POA: Insufficient documentation

## 2020-04-22 DIAGNOSIS — N170 Acute kidney failure with tubular necrosis: Secondary | ICD-10-CM | POA: Diagnosis not present

## 2020-04-22 LAB — POCT HEMOGLOBIN-HEMACUE: Hemoglobin: 7.1 g/dL — ABNORMAL LOW (ref 13.0–17.0)

## 2020-04-22 MED ORDER — EPOETIN ALFA-EPBX 10000 UNIT/ML IJ SOLN: 20000.0000 [IU] | INTRAMUSCULAR | Status: DC

## 2020-04-22 MED ORDER — SODIUM CHLORIDE 0.9 % IV SOLN
510.0000 mg | INTRAVENOUS | Status: DC
  Administered 2020-04-22: 510 mg via INTRAVENOUS
  Filled 2020-04-22: qty 510

## 2020-04-22 MED ORDER — EPOETIN ALFA-EPBX 10000 UNIT/ML IJ SOLN
INTRAMUSCULAR | Status: AC
  Administered 2020-04-22: 20000 [IU] via SUBCUTANEOUS
  Filled 2020-04-22: qty 2

## 2020-04-23 ENCOUNTER — Ambulatory Visit (INDEPENDENT_AMBULATORY_CARE_PROVIDER_SITE_OTHER): Payer: Medicaid Other | Admitting: Primary Care

## 2020-04-23 ENCOUNTER — Other Ambulatory Visit: Payer: Self-pay

## 2020-04-23 ENCOUNTER — Encounter (INDEPENDENT_AMBULATORY_CARE_PROVIDER_SITE_OTHER): Payer: Self-pay | Admitting: Primary Care

## 2020-04-23 VITALS — BP 169/78 | HR 79 | Temp 97.5°F | Ht 73.0 in | Wt 201.2 lb

## 2020-04-23 DIAGNOSIS — Z1211 Encounter for screening for malignant neoplasm of colon: Secondary | ICD-10-CM | POA: Diagnosis not present

## 2020-04-23 DIAGNOSIS — N184 Chronic kidney disease, stage 4 (severe): Secondary | ICD-10-CM | POA: Diagnosis not present

## 2020-04-23 DIAGNOSIS — Z23 Encounter for immunization: Secondary | ICD-10-CM

## 2020-04-23 DIAGNOSIS — E119 Type 2 diabetes mellitus without complications: Secondary | ICD-10-CM | POA: Diagnosis not present

## 2020-04-23 DIAGNOSIS — Z0000 Encounter for general adult medical examination without abnormal findings: Secondary | ICD-10-CM

## 2020-04-23 DIAGNOSIS — M2550 Pain in unspecified joint: Secondary | ICD-10-CM | POA: Diagnosis not present

## 2020-04-23 DIAGNOSIS — I10 Essential (primary) hypertension: Secondary | ICD-10-CM

## 2020-04-23 NOTE — Progress Notes (Signed)
Established Patient Office Visit  Subjective:  Patient ID: Cody Webster, male    DOB: Oct 13, 1965  Age: 54 y.o. MRN: [id number]  CC:  Chief Complaint  Patient presents with   Follow-up    With fasting labs     HPI Mr.Cody Webster is a 54 year old male who presents for labs and requesting a referral for pain management due to his insurance will no longer pay for current provider. Referral made.   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 ([place]) 2015   EF now improved back to normal mildly reduced   Peripheral neuropathy    Pneumonia    PTSD (post-traumatic stress disorder)    Seizures ([place])    27 years ago    Past Surgical History:  Procedure Laterality Date   AV FISTULA PLACEMENT Left 11/08/2016   Procedure: ARTERIOVENOUS (AV) FISTULA CREATION;  Surgeon: Angelia Mould, MD;  Location: [place];  Service: Vascular;  Laterality: Left;   AV FISTULA PLACEMENT Left 03/23/2020   Procedure: LEFT BASILIC VEIN FISTULA CREATION;  Surgeon: Angelia Mould, MD;  Location: [facility] OR;  Service: Vascular;  Laterality: Left;   NM MYOVIEW LTD  07/2016   "Moderate sized moderate severity" (on cardiology was very small size, small intensity) partially reversible inferoapical//inferoseptal defect.  (Read as intermediate-high risk) -> over read by cardiology as LOW RISK   right foot surgery     TOE AMPUTATION     TRANSTHORACIC ECHOCARDIOGRAM  07/2016   EF 60%.,  Moderate LVH.  GR 1 DD.  No R WMA.  Mild RV and moderate RA dilation.    Family History  Problem Relation Age of Onset   CAD Mother    Diabetes type II Father    Hypertension Sister    CAD Sister     Diabetes type II Brother     Social History   Socioeconomic History   Marital status: Single    Spouse name: Not on file   Number of children: 1   Years of education: Not on file   Highest education level: Not on file  Occupational History    Comment: Disabled - for Back pain & foot ulcer  Tobacco Use   Smoking status: Current Every Day Smoker    Packs/day: 1.00    Years: 30.00    Pack years: 30.00    Types: Cigarettes   Smokeless tobacco: Former Systems developer    Types: Snuff, Chew    Quit date: 12/11/1983  Vaping Use   Vaping Use: Never used  Substance and Sexual Activity   Alcohol use: Not Currently    Comment: occasionally   Drug use: No   Sexual activity: Not Currently  Other Topics Concern   Not on file  Social History Narrative   Not on file   Social Determinants of Health   Financial Resource Strain: Not on file  Food Insecurity: Not on file  Transportation Needs: Not on file  Physical Activity: Not on file  Stress: Not on file  Social Connections: Not on file  Intimate Partner Violence: Not on file    Outpatient Medications Prior to Visit  Medication Sig Dispense Refill   acetaminophen (TYLENOL) 500 MG tablet Take 1,000 mg by mouth every 6 (six) hours as needed (for pain/headaches.).     aspirin EC 81 MG tablet Take 81 mg by mouth daily.     calcitRIOL (ROCALTROL) 0.5 MCG capsule Take 0.5 mcg by mouth daily.     carvedilol (COREG) 25 MG tablet Take 1 tablet (25 mg total) by mouth 2 (two) times daily with a meal. Reported on 05/27/2015 (Patient taking differently: Take 25 mg by mouth 3 (three) times daily with meals.) 180 tablet 3   cetirizine (ZYRTEC) 10 MG tablet Take 1 tablet (10 mg total) by mouth daily. 90 tablet 6   cloNIDine (CATAPRES) 0.1 MG tablet Take 1 tablet (0.1 mg total) by mouth 3 (three) times daily. 270 tablet 3   gabapentin (NEURONTIN) 300 MG capsule TAKE 1 CAPSULE (300 MG TOTAL) BY MOUTH 2 (TWO) TIMES DAILY. 60 capsule 3    hydrALAZINE (APRESOLINE) 50 MG tablet TAKE 2 TABLETS (100 MG TOTAL) BY MOUTH 3 (THREE) TIMES DAILY. 180 tablet 2   nitroGLYCERIN (NITROSTAT) 0.4 MG SL tablet Place 1 tablet (0.4 mg total) under the tongue every 5 (five) minutes as needed for chest pain (CP or SOB). 30 tablet 0   oxyCODONE-acetaminophen (PERCOCET) 10-325 MG tablet Take 1 tablet by mouth every 6 (six) hours as needed. For pain. (Patient taking differently: Take 1 tablet by mouth 5 (five) times daily. For pain.) 8 tablet 0   rOPINIRole (REQUIP) 0.25 MG tablet Take 1 tablet (0.25 mg total) by mouth at bedtime. 90 tablet 0   traMADol (ULTRAM) 50 MG tablet Take 1 tablet (50 mg total) by mouth every 6 (six) hours as needed. 15 tablet 0   traZODone (DESYREL) 100 MG tablet Take 1 tablet (100 mg total) by mouth at bedtime as needed for sleep. Take 1/2 to 1 tablet at bedtime for insomnia 90 tablet 1   buPROPion (WELLBUTRIN XL) 150 MG 24 hr tablet Take 1 tablet (150 mg total) by mouth daily. (Patient not taking: No sig reported) 90 tablet 1   fluticasone (FLONASE) 50 MCG/ACT nasal spray Place 2 sprays into both nostrils daily. (Patient not taking: Reported on 04/23/2020) 16 g 6   No facility-administered medications prior to visit.    Allergies  Allergen Reactions   Influenza Vaccines     Chest congestion, fever   Morphine And Related Shortness Of Breath and Other (See Comments)    Hallucination  Tolerates Norco/Vicodin   Pneumococcal Vaccines Nausea And Vomiting    FEVER    ROS Review of Systems  Eyes:       Right eye blind  Musculoskeletal: Positive for arthralgias and back pain.       Legs feet  , knees  Neurological: Positive for numbness.       Tingling hands and feet  All other systems reviewed and are negative.     Objective:    Physical Exam Vitals reviewed.  Constitutional:      Appearance: Normal appearance.  HENT:     Head: Normocephalic.     Nose: Nose normal.  Eyes:     Extraocular Movements:  Extraocular movements intact.  Cardiovascular:     Rate and Rhythm: Normal rate and regular rhythm.     Pulses: Normal pulses.     Heart sounds: Normal heart sounds.  Pulmonary:     Effort: Pulmonary effort is normal.     Breath sounds: Normal breath sounds.  Abdominal:  General: Bowel sounds are normal.     Palpations: Abdomen is soft.  Musculoskeletal:        General: Normal range of motion.     Cervical back: Normal range of motion.  Skin:    General: Skin is warm and dry.  Neurological:     Mental Status: He is alert and oriented to person, place, and time.  Psychiatric:        Mood and Affect: Mood normal.        Behavior: Behavior normal.        Thought Content: Thought content normal.        Judgment: Judgment normal.     BP (!) 169/78 (BP Location: Right Arm, Patient Position: Sitting, Cuff Size: Normal)    Pulse 79    Temp (!) 97.5 F (36.4 C) (Temporal)    Ht '6\' 1"'  (1.854 m)    Wt 201 lb 3.2 oz (91.3 kg)    SpO2 92%    BMI 26.55 kg/m  Wt Readings from Last 3 Encounters:  04/23/20 201 lb 3.2 oz (91.3 kg)  03/23/20 193 lb (87.5 kg)  03/10/20 198 lb 9.6 oz (90.1 kg)     Health Maintenance Due  Topic Date Due   Hepatitis C Screening  Never done   PNEUMOCOCCAL POLYSACCHARIDE VACCINE AGE 6-64 HIGH RISK  Never done   OPHTHALMOLOGY EXAM  Never done   COVID-19 Vaccine (1) Never done   COLONOSCOPY  Never done   HEMOGLOBIN A1C  03/27/2020    There are no preventive care reminders to display for this patient.  Lab Results  Component Value Date   TSH 1.016 07/12/2014   Lab Results  Component Value Date   WBC 4.5 10/03/2019   HGB 7.1 (L) 04/22/2020   HCT 30.0 (L) 03/23/2020   MCV 99.4 10/03/2019   PLT 184 10/03/2019   Lab Results  Component Value Date   NA 142 03/23/2020   K 4.5 03/23/2020   CO2 19 (L) 10/03/2019   GLUCOSE 89 03/23/2020   BUN 49 (H) 03/23/2020   CREATININE 9.20 (H) 03/23/2020   BILITOT 0.3 08/30/2017   ALKPHOS 708 (H)  08/30/2017   AST 53 (H) 08/30/2017   ALT 50 (H) 08/30/2017   PROT 6.8 08/30/2017   ALBUMIN 4.0 08/30/2017   CALCIUM 8.1 (L) 10/03/2019   ANIONGAP 10 10/03/2019   Lab Results  Component Value Date   CHOL 163 08/30/2017   Lab Results  Component Value Date   HDL 78 08/30/2017   Lab Results  Component Value Date   LDLCALC 65 08/30/2017   Lab Results  Component Value Date   TRIG 102 08/30/2017   Lab Results  Component Value Date   CHOLHDL 2.1 08/30/2017   Lab Results  Component Value Date   HGBA1C 4.7 09/25/2019      Assessment & Plan:  [place] was seen today for follow-up.  Diagnoses and all orders for this visit:  Essential hypertension Elevated followed cardiology stopped taking carvedilol 25 mg bid  But takes hydralazine 144m tid  (states only takes bid) takes clonidine .140mbid prescribed tid   Type 2 diabetes mellitus without complication, without long-term current use of insulin (HCC) -     Lipid Panel  Healthcare maintenance Pneumococcal conjugate vaccine  CKD (chronic kidney disease), stage IV (HCC) Followed by nephrology awaiting kidney transplant  -     CMP14+EGFR  Arthralgia, unspecified joint -     Rheumatoid Arthritis Profile  No orders of the defined types were placed in this encounter.   Follow-up: Return if symptoms worsen or fail to improve.    Kerin Perna, NP

## 2020-04-25 LAB — CMP14+EGFR
ALT: 15 IU/L (ref 0–44)
AST: 13 IU/L (ref 0–40)
Albumin/Globulin Ratio: 1.2 (ref 1.2–2.2)
Albumin: 3.6 g/dL — ABNORMAL LOW (ref 3.8–4.9)
Alkaline Phosphatase: 313 IU/L — ABNORMAL HIGH (ref 44–121)
BUN/Creatinine Ratio: 7 — ABNORMAL LOW (ref 9–20)
BUN: 49 mg/dL — ABNORMAL HIGH (ref 6–24)
Bilirubin Total: 0.4 mg/dL (ref 0.0–1.2)
CO2: 16 mmol/L — ABNORMAL LOW (ref 20–29)
Calcium: 8.4 mg/dL — ABNORMAL LOW (ref 8.7–10.2)
Chloride: 109 mmol/L — ABNORMAL HIGH (ref 96–106)
Creatinine, Ser: 7.32 mg/dL — ABNORMAL HIGH (ref 0.76–1.27)
GFR calc Af Amer: 9 mL/min/{1.73_m2} — ABNORMAL LOW (ref >59)
GFR calc non Af Amer: 8 mL/min/{1.73_m2} — ABNORMAL LOW (ref >59)
Globulin, Total: 3.1 g/dL (ref 1.5–4.5)
Glucose: 84 mg/dL (ref 65–99)
Potassium: 5.2 mmol/L (ref 3.5–5.2)
Sodium: 138 mmol/L (ref 134–144)
Total Protein: 6.7 g/dL (ref 6.0–8.5)

## 2020-04-25 LAB — LIPID PANEL
Chol/HDL Ratio: 3.8 ratio (ref 0.0–5.0)
Cholesterol, Total: 192 mg/dL (ref 100–199)
HDL: 50 mg/dL (ref >39)
LDL Chol Calc (NIH): 128 mg/dL — ABNORMAL HIGH (ref 0–99)
Triglycerides: 74 mg/dL (ref 0–149)
VLDL Cholesterol Cal: 14 mg/dL (ref 5–40)

## 2020-04-25 LAB — RHEUMATOID ARTHRITIS PROFILE
Cyclic Citrullin Peptide Ab: 7 units (ref 0–19)
Rheumatoid fact SerPl-aCnc: <10 IU/mL (ref <14.0)

## 2020-04-27 ENCOUNTER — Telehealth (INDEPENDENT_AMBULATORY_CARE_PROVIDER_SITE_OTHER): Payer: Self-pay | Admitting: Primary Care

## 2020-04-27 NOTE — Telephone Encounter (Signed)
Pt needs a new referral for pain management placed asap/ please advise due to denial of the referral placed already / please advised asap

## 2020-04-27 NOTE — Telephone Encounter (Signed)
Caryl Pina RN calling [employer] is calling to request a referral for pain mangagement. Patient states that the current pain management doctor does not accept his insurance. The current doctors name Dr. Erasmo Downer [facility]. They denied him as a patient for pain management. [place] [phone] Please advise with Patient.

## 2020-04-29 ENCOUNTER — Ambulatory Visit (HOSPITAL_COMMUNITY)
Admission: RE | Admit: 2020-04-29 | Discharge: 2020-04-29 | Disposition: A | Discharge status: Home / Self Care | Payer: Medicaid Other | Source: Ambulatory Visit | Attending: Vascular Surgery | Admitting: Vascular Surgery

## 2020-04-29 ENCOUNTER — Encounter: Payer: Self-pay | Admitting: Vascular Surgery

## 2020-04-29 ENCOUNTER — Other Ambulatory Visit: Payer: Self-pay

## 2020-04-29 ENCOUNTER — Ambulatory Visit (INDEPENDENT_AMBULATORY_CARE_PROVIDER_SITE_OTHER): Payer: Self-pay | Admitting: Vascular Surgery

## 2020-04-29 VITALS — BP 189/92 | HR 79 | Temp 98.1°F | Resp 20 | Ht 73.0 in | Wt 197.0 lb

## 2020-04-29 DIAGNOSIS — N185 Chronic kidney disease, stage 5: Secondary | ICD-10-CM | POA: Diagnosis not present

## 2020-04-29 NOTE — Progress Notes (Signed)
Patient name: Cody Webster MRN: [id number] DOB: 1965/07/09 Sex: male  REASON FOR VISIT:   Follow-up after left first stage basilic vein transposition  HPI:   Cody Webster is a pleasant 54 y.o. male who previously had a left brachiocephalic fistula which occluded.  He was set up for a basilic vein transposition.  On 03/23/2020 he had a left first stage basilic vein transposition.  He comes in for 6-week follow-up visit.  Since I saw him last, he denies any history of significant left arm pain.  He is not on dialysis.  Current Outpatient Medications  Medication Sig Dispense Refill  . acetaminophen (TYLENOL) 500 MG tablet Take 1,000 mg by mouth every 6 (six) hours as needed (for pain/headaches.).    Marland Kitchen aspirin EC 81 MG tablet Take 81 mg by mouth daily.    Marland Kitchen buPROPion (WELLBUTRIN XL) 150 MG 24 hr tablet Take 1 tablet (150 mg total) by mouth daily. 90 tablet 1  . calcitRIOL (ROCALTROL) 0.5 MCG capsule Take 0.5 mcg by mouth daily.    . carvedilol (COREG) 25 MG tablet Take 1 tablet (25 mg total) by mouth 2 (two) times daily with a meal. Reported on 05/27/2015 (Patient taking differently: Take 25 mg by mouth 3 (three) times daily with meals.) 180 tablet 3  . cetirizine (ZYRTEC) 10 MG tablet Take 1 tablet (10 mg total) by mouth daily. 90 tablet 6  . cloNIDine (CATAPRES) 0.1 MG tablet Take 1 tablet (0.1 mg total) by mouth 3 (three) times daily. 270 tablet 3  . fluticasone (FLONASE) 50 MCG/ACT nasal spray Place 2 sprays into both nostrils daily. 16 g 6  . gabapentin (NEURONTIN) 300 MG capsule TAKE 1 CAPSULE (300 MG TOTAL) BY MOUTH 2 (TWO) TIMES DAILY. 60 capsule 3  . hydrALAZINE (APRESOLINE) 50 MG tablet TAKE 2 TABLETS (100 MG TOTAL) BY MOUTH 3 (THREE) TIMES DAILY. 180 tablet 2  . nitroGLYCERIN (NITROSTAT) 0.4 MG SL tablet Place 1 tablet (0.4 mg total) under the tongue every 5 (five) minutes as needed for chest pain (CP or SOB). 30 tablet 0  . oxyCODONE-acetaminophen (PERCOCET) 10-325 MG tablet Take 1  tablet by mouth every 6 (six) hours as needed. For pain. (Patient taking differently: Take 1 tablet by mouth 5 (five) times daily. For pain.) 8 tablet 0  . rOPINIRole (REQUIP) 0.25 MG tablet Take 1 tablet (0.25 mg total) by mouth at bedtime. 90 tablet 0  . traMADol (ULTRAM) 50 MG tablet Take 1 tablet (50 mg total) by mouth every 6 (six) hours as needed. 15 tablet 0  . traZODone (DESYREL) 100 MG tablet Take 1 tablet (100 mg total) by mouth at bedtime as needed for sleep. Take 1/2 to 1 tablet at bedtime for insomnia 90 tablet 1   No current facility-administered medications for this visit.    REVIEW OF SYSTEMS:  [X]  denotes positive finding, [ ]  denotes negative finding Vascular    Leg swelling    Cardiac    Chest pain or chest pressure:    Shortness of breath upon exertion:    Short of breath when lying flat:    Irregular heart rhythm:    Constitutional    Fever or chills:     PHYSICAL EXAM:   Vitals:   04/29/20 1218  BP: (!) 189/92  Pulse: 79  Resp: 20  Temp: 98.1 F (36.7 C)  SpO2: 97%  Weight: 197 lb (89.4 kg)  Height: 6\' 1"  (1.854 m)    GENERAL: The patient is a  well-nourished male, in no acute distress. The vital signs are documented above. CARDIOVASCULAR: There is a regular rate and rhythm. PULMONARY: There is good air exchange bilaterally without wheezing or rales. His fistula has an excellent thrill.  He has a diminished radial pulse.  DATA:   DUPLEX AV FISTULA: I have independently interpreted his duplex of the AV fistula.  The fistula is widely patent.  The diameters of the fistula ranged from 0.61-1.06 cm.  MEDICAL ISSUES:   STAGE V CHRONIC KIDNEY DISEASE: The patient's for stage left basilic vein transposition is maturing nicely.  I think we can proceed with a second stage.  This has been scheduled for Monday, 05/18/2020.  I discussed the indications for the procedure and the potential complications and he is agreeable to proceed.  [name] [facility] [phone]

## 2020-04-30 DIAGNOSIS — M79604 Pain in right leg: Secondary | ICD-10-CM | POA: Diagnosis not present

## 2020-04-30 DIAGNOSIS — J301 Allergic rhinitis due to pollen: Secondary | ICD-10-CM | POA: Diagnosis not present

## 2020-04-30 DIAGNOSIS — G894 Chronic pain syndrome: Secondary | ICD-10-CM | POA: Diagnosis not present

## 2020-04-30 DIAGNOSIS — J3089 Other allergic rhinitis: Secondary | ICD-10-CM | POA: Diagnosis not present

## 2020-04-30 DIAGNOSIS — M25571 Pain in right ankle and joints of right foot: Secondary | ICD-10-CM | POA: Diagnosis not present

## 2020-04-30 DIAGNOSIS — M79672 Pain in left foot: Secondary | ICD-10-CM | POA: Diagnosis not present

## 2020-04-30 DIAGNOSIS — M545 Low back pain, unspecified: Secondary | ICD-10-CM | POA: Diagnosis not present

## 2020-04-30 DIAGNOSIS — J309 Allergic rhinitis, unspecified: Secondary | ICD-10-CM | POA: Diagnosis not present

## 2020-04-30 DIAGNOSIS — R208 Other disturbances of skin sensation: Secondary | ICD-10-CM | POA: Diagnosis not present

## 2020-04-30 DIAGNOSIS — M79671 Pain in right foot: Secondary | ICD-10-CM | POA: Diagnosis not present

## 2020-04-30 DIAGNOSIS — M25572 Pain in left ankle and joints of left foot: Secondary | ICD-10-CM | POA: Diagnosis not present

## 2020-04-30 DIAGNOSIS — M79605 Pain in left leg: Secondary | ICD-10-CM | POA: Diagnosis not present

## 2020-05-06 ENCOUNTER — Encounter (HOSPITAL_COMMUNITY)
Admission: RE | Admit: 2020-05-06 | Discharge: 2020-05-06 | Disposition: A | Discharge status: Home / Self Care | Payer: Medicaid Other | Source: Ambulatory Visit | Attending: Nephrology | Admitting: Nephrology

## 2020-05-06 ENCOUNTER — Other Ambulatory Visit: Payer: Self-pay

## 2020-05-06 VITALS — BP 133/56 | HR 73 | Temp 97.7°F | Resp 18

## 2020-05-06 DIAGNOSIS — N185 Chronic kidney disease, stage 5: Secondary | ICD-10-CM | POA: Diagnosis not present

## 2020-05-06 DIAGNOSIS — N170 Acute kidney failure with tubular necrosis: Secondary | ICD-10-CM | POA: Diagnosis not present

## 2020-05-06 LAB — POCT HEMOGLOBIN-HEMACUE: Hemoglobin: 8.2 g/dL — ABNORMAL LOW (ref 13.0–17.0)

## 2020-05-06 MED ORDER — EPOETIN ALFA-EPBX 10000 UNIT/ML IJ SOLN
INTRAMUSCULAR | Status: AC
  Filled 2020-05-06: qty 2

## 2020-05-06 MED ORDER — SODIUM CHLORIDE 0.9 % IV SOLN
510.0000 mg | INTRAVENOUS | Status: DC
  Administered 2020-05-06: 12:00:00 510 mg via INTRAVENOUS
  Filled 2020-05-06: qty 510

## 2020-05-06 MED ORDER — EPOETIN ALFA-EPBX 10000 UNIT/ML IJ SOLN
20000.0000 [IU] | INTRAMUSCULAR | Status: DC
  Administered 2020-05-06: 12:00:00 20000 [IU] via SUBCUTANEOUS

## 2020-05-06 NOTE — Progress Notes (Signed)
Patient  Iv would not run with feraheme, difficult to flush. Iv team was notified . Patient Left before IV team came and did not receive feraheme.

## 2020-05-12 ENCOUNTER — Encounter (HOSPITAL_COMMUNITY)
Admission: RE | Admit: 2020-05-12 | Discharge: 2020-05-12 | Disposition: A | Discharge status: Home / Self Care | Payer: Medicaid Other | Source: Ambulatory Visit | Attending: Nephrology | Admitting: Nephrology

## 2020-05-12 ENCOUNTER — Other Ambulatory Visit: Payer: Self-pay

## 2020-05-12 DIAGNOSIS — N185 Chronic kidney disease, stage 5: Secondary | ICD-10-CM | POA: Diagnosis not present

## 2020-05-12 DIAGNOSIS — N170 Acute kidney failure with tubular necrosis: Secondary | ICD-10-CM | POA: Diagnosis not present

## 2020-05-12 MED ORDER — SODIUM CHLORIDE 0.9 % IV SOLN
510.0000 mg | Freq: Once | INTRAVENOUS | Status: AC
  Administered 2020-05-12: 09:00:00 510 mg via INTRAVENOUS
  Filled 2020-05-12: qty 510

## 2020-05-13 ENCOUNTER — Encounter (HOSPITAL_COMMUNITY): Payer: Self-pay | Admitting: Vascular Surgery

## 2020-05-13 ENCOUNTER — Other Ambulatory Visit: Payer: Self-pay

## 2020-05-13 NOTE — Progress Notes (Signed)
Anesthesia Chart Review: Cody Webster   Case: [id number] Date/Time: 05/18/20 1039   Procedure: LEFT SECOND STAGE BASCILIC VEIN TRANSPOSITION (Left )   Anesthesia type: Monitor Anesthesia Care   Pre-op diagnosis: CHRONIC KIDNEY DISEASE STAGE V   Location: [place] OR ROOM 12 / [place] OR   Surgeons: Angelia Mould, MD      DISCUSSION: Patient is a 54 year old male scheduled for the above procedure. He is s/p first stage left basilic vein transposition on 03/23/2020.  History includes smoking, HTN, DM2, GERD, CKD, anemia, PTSD, carotid artery disease (moderate LICA stenosis 8588), seizure (> 25 years ago), peripheral neuropathy, toe amputation.  See anesthesia APP note by Karoline Caldwell, PA-C which includes: "Patient had recent cardiac testing at [facility] as part of evaluation for renal transplant.  Echo 5/21 showed moderately decreased systolic function with LVEF 40%, mild MR.  Nuclear stress 5/21 showed no definitive evidence of ischemia, small, mild, completely reversible defect involving the apical lateral and mid anterolateral segments consistent with probable artifact but cannot be ruled out mild ischemia.  Full results below.  He has not had cardiology follow-up since that testing. Last cardiology visit was with Dr. Ellyn Hack 05/20/19, per note, 'He may be class I CHF symptoms, but difficult to assess because of his deconditioning and limited mobility...      Most recent evaluation in 2019 showed relatively normal echocardiogram and on further review of relatively negative/nonischemic stress test in the absence of any symptoms.  I do suspect that as part of his transplant evaluation, he would potentially need another ischemic evaluation.  However he is currently not actively having symptoms and I therefore would not check at this point.  Would be reluctant to perform until we are at the stage where he is actually going to be needed having transplant or on dialysis, because with his baseline renal  dysfunction, would probably not tolerate any contrast load from heart catheterization if there is abnormal findings.  We will defer additional preop evaluation until the decision is made about whether he will start dialysis versus transplant.'"  He tolerated first stage procedure within the last two months. He will continue ASA per [facility]. Anesthesia team to evaluate on the day of surgery.  Preoperative COVID-19 testing is scheduled for 05/14/2020.   VS: Ht 6\' 1"  (1.854 m)   Wt 88.5 kg   BMI 25.73 kg/m   BP Readings from Last 3 Encounters:  05/12/20 140/66  05/06/20 (!) 133/56  04/29/20 (!) 189/92   Pulse Readings from Last 3 Encounters:  05/12/20 79  05/06/20 73  04/29/20 79    PROVIDERS: Kerin Perna, NP is PCP  Glenetta Hew, MD is Cardiologist Corliss Parish, MD is nephrologist   LABS: For day of surgery. As of 05/06/20, HGB 8.2. A1c 4.7% on 09/25/19.    IMAGES: CXR 10/03/19: FINDINGS: The heart size and mediastinal contours are within normal limits. Both lungs are clear. Aortic atherosclerosis. The visualized skeletal structures are unremarkable. IMPRESSION: No active cardiopulmonary disease.  EKG 10/03/19: Sinus rhythm. Rate 80. Probable left atrial enlargement. Probable left ventricular hypertrophy. Nonspecific T abnormalities, lateral leads. Non-specific ST-t changes    CV: TTE 10/09/2019 (Care Everywhere): Summary  1. The left ventricle is upper normal in size with moderately increased wall  thickness.  2. The left ventricular systolic function is moderately decreased, LVEF is  visually estimated at 40%.  3. There is mild mitral valve regurgitation.  4. The aortic valve is trileaflet with mildly thickened  leaflets with mildly  reduced excursion.  5. The left atrium is moderately dilated in size.  6. The right ventricle is normal in size, with normal systolic function.   Nuclear stress 10/01/2019 (Care Everywhere): Impressions:   - No definitive evidence of ischemia.  - There is a small in size, mild in severity, completely reversible defect involving the apical lateral and mid anterolateral segments. This is consistent with probable artifact but cannot rule out mild ischemia.  - Post stress: Global systolic function is normal. The ejection fraction calculated at 55%.  - Coronary calcifications are noted   - Per vascular surgeon note Oneida Alar, Juanda Crumble, MD) on 05/27/15, "Patient had a carotid duplex scanning [place] imaging dated 04/01/2015. This suggested greater than 70% left internal carotid artery stenosis. However velocities were only 269/13 which I would suggest is probably less than 60% stenosis. Right side had no significant stenosis."   Past Medical History:  Diagnosis Date  . Anemia   . Carotid artery occlusion   . 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 Diabetes mellitus without complication ([place])    type 2  . GERD (gastroesophageal reflux disease)   . Hypertension   . 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 Peripheral neuropathy   . Pneumonia   . PTSD (post-traumatic stress disorder)   . Seizures ([place])    27 years ago    Past Surgical History:  Procedure Laterality Date  . AV FISTULA PLACEMENT Left 11/08/2016   Procedure: ARTERIOVENOUS (AV) FISTULA CREATION;  Surgeon: Angelia Mould, MD;  Location: [place];  Service: Vascular;  Laterality: Left;  . AV FISTULA PLACEMENT Left 03/23/2020   Procedure: LEFT BASILIC VEIN FISTULA CREATION;  Surgeon: Angelia Mould, MD;  Location: [place];  Service: Vascular;  Laterality: Left;  . NM MYOVIEW LTD  07/2016   "Moderate sized moderate severity" (on cardiology was very small size, small intensity) partially reversible inferoapical//inferoseptal defect.  (Read as intermediate-high risk) -> over read by cardiology  as LOW RISK  . right foot surgery    . TOE AMPUTATION    . TRANSTHORACIC ECHOCARDIOGRAM  07/2016   EF 60%.,  Moderate LVH.  GR 1 DD.  No R WMA.  Mild RV and moderate RA dilation.    MEDICATIONS: No current facility-administered medications for this encounter.   Marland Kitchen acetaminophen (TYLENOL) 500 MG tablet  . aspirin EC 81 MG tablet  . cloNIDine (CATAPRES) 0.1 MG tablet  . fluticasone (FLONASE) 50 MCG/ACT nasal spray  . gabapentin (NEURONTIN) 300 MG capsule  . hydrALAZINE (APRESOLINE) 50 MG tablet  . nitroGLYCERIN (NITROSTAT) 0.4 MG SL tablet  . oxyCODONE-acetaminophen (PERCOCET) 10-325 MG tablet  . traZODone (DESYREL) 100 MG tablet  . buPROPion (WELLBUTRIN XL) 150 MG 24 hr tablet  . cetirizine (ZYRTEC) 10 MG tablet  . rOPINIRole (REQUIP) 0.25 MG tablet  . traMADol (ULTRAM) 50 MG tablet    Myra Gianotti, PA-C Surgical Short Stay/Anesthesiology [facility] Phone [phone] [facility] Phone [phone] 05/13/2020 7:41 PM

## 2020-05-13 NOTE — Progress Notes (Addendum)
PCP - Dr. Oletta Lamas  Cardiologist - Dr. Ellyn Hack   Chest x-ray - 10/03/19 EKG - 10/04/19 Stress Test - deneis ECHO - 07/21/17 Cardiac Cath - denies  Pt does not check sugars at home   Blood Thinner Instructions: n/a Aspirin Instructions: per order of VVS to continue ASA  COVID TEST- 05/14/20  Anesthesia review: n/a  -------------  SDW INSTRUCTIONS:  Your procedure is scheduled on 05/18/20 Monday. Please report to [facility] Main Entrance "A" at [place].M., and check in at the Admitting office. Call this number if you have problems the morning of surgery: [phone]   Remember: Do not eat or drink after midnight the night before your surgery   Medications to take morning of surgery with a sip of water include: acetaminophen (TYLENOL)  cloNIDine (CATAPRES) fluticasone (FLONASE)  hydrALAZINE (APRESOLINE) nitroGLYCERIN (NITROSTAT)  If needed oxyCODONE-acetaminophen (PERCOCET)  If needed  As of today, STOP taking any Aleve, Naproxen, Ibuprofen, Motrin, Advil, Goody's, BC's, all herbal medications, fish oil, and all vitamins.    The Morning of Surgery Do not wear jewelry Do not wear lotions, powders, colognes, or deodorant Men may shave face and neck. Do not bring valuables to the hospital. [facility] is not responsible for any belongings or valuables. If you are a smoker, DO NOT Smoke 24 hours prior to surgery  Remember that you must have someone to transport you home after your surgery, and remain with you for 24 hours if you are discharged the same day. Please bring cases for contacts, glasses, hearing aids, dentures or bridgework because it cannot be worn into surgery.   Patients discharged the day of surgery will not be allowed to drive home.   Please shower the NIGHT BEFORE SURGERY and the MORNING OF SURGERY with DIAL Soap. Wear comfortable clothes the morning of surgery. Oral Hygiene is also important to reduce your risk of infection.  Remember - BRUSH YOUR TEETH THE  MORNING OF SURGERY WITH YOUR REGULAR TOOTHPASTE  Patient denies shortness of breath, fever, cough and chest pain.

## 2020-05-13 NOTE — Anesthesia Preprocedure Evaluation (Deleted)
Anesthesia Evaluation    Airway        Dental   Pulmonary Current Smoker and Patient abstained from smoking.,  05/14/2020 SARS coronavirus NEG          Cardiovascular hypertension, Pt. on medications + Peripheral Vascular Disease    '19 ECHO: EF 55-60%. Wall motion was normal; there are no regional wall motion abnormalities. grade 1 diastolic dysfunction, no sig valvular abnormalities   Neuro/Psych  Headaches, PSYCHIATRIC DISORDERS (PTSD) Anxiety    GI/Hepatic   Endo/Other  diabetes  Renal/GU ESRFRenal disease     Musculoskeletal   Abdominal   Peds  Hematology  (+) Blood dyscrasia (Hb 8.2), anemia ,   Anesthesia Other Findings   Reproductive/Obstetrics                            Anesthesia Physical Anesthesia Plan  ASA: III  Anesthesia Plan: General   Post-op Pain Management:    Induction: Intravenous  PONV Risk Score and Plan: 1 and Ondansetron and Dexamethasone  Airway Management Planned: LMA  Additional Equipment: None  Intra-op Plan:   Post-operative Plan:   Informed Consent:   Plan Discussed with:   Anesthesia Plan Comments: (PAT note written 05/13/2020 by Myra Gianotti, PA-C. )       Anesthesia Quick Evaluation

## 2020-05-14 ENCOUNTER — Other Ambulatory Visit (HOSPITAL_COMMUNITY)
Admission: RE | Admit: 2020-05-14 | Discharge: 2020-05-14 | Disposition: A | Discharge status: Home / Self Care | Payer: Medicaid Other | Source: Ambulatory Visit | Attending: Vascular Surgery | Admitting: Vascular Surgery

## 2020-05-14 DIAGNOSIS — Z20822 Contact with and (suspected) exposure to covid-19: Secondary | ICD-10-CM | POA: Diagnosis not present

## 2020-05-14 DIAGNOSIS — Z01812 Encounter for preprocedural laboratory examination: Secondary | ICD-10-CM | POA: Diagnosis not present

## 2020-05-14 LAB — SARS CORONAVIRUS 2 (TAT 6-24 HRS): SARS Coronavirus 2: NEGATIVE

## 2020-05-18 ENCOUNTER — Ambulatory Visit (HOSPITAL_COMMUNITY): Admission: RE | Admit: 2020-05-18 | Payer: Medicaid Other | Source: Home / Self Care | Admitting: Vascular Surgery

## 2020-05-18 SURGERY — TRANSPOSITION, VEIN, BASILIC
Anesthesia: Monitor Anesthesia Care | Laterality: Left

## 2020-05-20 ENCOUNTER — Encounter (HOSPITAL_COMMUNITY): Payer: Medicaid Other

## 2020-05-27 ENCOUNTER — Telehealth (INDEPENDENT_AMBULATORY_CARE_PROVIDER_SITE_OTHER): Payer: Self-pay

## 2020-05-27 ENCOUNTER — Encounter (INDEPENDENT_AMBULATORY_CARE_PROVIDER_SITE_OTHER): Payer: Self-pay | Admitting: Primary Care

## 2020-05-27 ENCOUNTER — Other Ambulatory Visit: Payer: Self-pay

## 2020-05-27 ENCOUNTER — Ambulatory Visit (INDEPENDENT_AMBULATORY_CARE_PROVIDER_SITE_OTHER): Payer: Medicaid Other | Admitting: Primary Care

## 2020-05-27 VITALS — BP 188/92 | HR 89 | Resp 16 | Wt 198.0 lb

## 2020-05-27 DIAGNOSIS — E119 Type 2 diabetes mellitus without complications: Secondary | ICD-10-CM | POA: Diagnosis not present

## 2020-05-27 DIAGNOSIS — N184 Chronic kidney disease, stage 4 (severe): Secondary | ICD-10-CM

## 2020-05-27 DIAGNOSIS — I10 Essential (primary) hypertension: Secondary | ICD-10-CM | POA: Diagnosis not present

## 2020-05-27 DIAGNOSIS — D649 Anemia, unspecified: Secondary | ICD-10-CM | POA: Diagnosis not present

## 2020-05-27 DIAGNOSIS — F5101 Primary insomnia: Secondary | ICD-10-CM | POA: Diagnosis not present

## 2020-05-27 MED ORDER — GABAPENTIN 300 MG PO CAPS: 300.0000 mg | ORAL_CAPSULE | Freq: Two times a day (BID) | ORAL | 1 refills | Status: DC

## 2020-05-27 MED ORDER — TRAZODONE HCL 100 MG PO TABS
100.0000 mg | ORAL_TABLET | Freq: Every evening | ORAL | 1 refills | Status: DC | PRN
Start: 2020-05-27 — End: 2020-08-25

## 2020-05-27 NOTE — Progress Notes (Signed)
Referral for pain management.  Please refer to [facility] for pain  Needs RF on medications

## 2020-05-27 NOTE — Progress Notes (Addendum)
Established Patient Office Visit  Subjective:  Patient ID: Cody Webster, male    DOB: Oct 17, 1965  Age: 55 y.o. MRN: [id number]  CC: No chief complaint on file.   HPI [name] is a 55 year old male who presents for follow up on where pain management referral is standing. Reviewed records with patient and informed that he was not a candidate for there office. He has chronic pain from lower back injury in the [employer]   Past Medical History:  Diagnosis Date  . Anemia   . Carotid artery occlusion   . 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 Diabetes mellitus without complication ([place])    type 2  . GERD (gastroesophageal reflux disease)   . Hypertension   . 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 ([place]) 2015   EF now improved back to normal mildly reduced  . Peripheral neuropathy   . Pneumonia   . PTSD (post-traumatic stress disorder)   . Seizures ([place])    27 years ago    Past Surgical History:  Procedure Laterality Date  . AV FISTULA PLACEMENT Left 11/08/2016   Procedure: ARTERIOVENOUS (AV) FISTULA CREATION;  Surgeon: Angelia Mould, MD;  Location: [place];  Service: Vascular;  Laterality: Left;  . AV FISTULA PLACEMENT Left 03/23/2020   Procedure: LEFT BASILIC VEIN FISTULA CREATION;  Surgeon: Angelia Mould, MD;  Location: [place];  Service: Vascular;  Laterality: Left;  . NM MYOVIEW LTD  07/2016   "Moderate sized moderate severity" (on cardiology was very small size, small intensity) partially reversible inferoapical//inferoseptal defect.  (Read as intermediate-high risk) -> over read by cardiology as LOW RISK  . right foot surgery    . TOE AMPUTATION    . TRANSTHORACIC ECHOCARDIOGRAM  07/2016   EF 60%.,  Moderate LVH.  GR 1 DD.  No R WMA.  Mild RV and moderate RA dilation.    Family History  Problem Relation Age of Onset  . CAD Mother   . Diabetes type II Father   .  Hypertension Sister   . CAD Sister   . Diabetes type II Brother     Social History   Socioeconomic History  . Marital status: Single    Spouse name: Not on file  . Number of children: 1  . Years of education: Not on file  . Highest education level: Not on file  Occupational History    Comment: Disabled - for Back pain & foot ulcer  Tobacco Use  . Smoking status: Current Every Day Smoker    Packs/day: 0.50    Years: 30.00    Pack years: 15.00    Types: Cigarettes  . Smokeless tobacco: Former Systems developer    Types: Snuff, Sarina Ser    Quit date: 12/11/1983  Vaping Use  . Vaping Use: Never used  Substance and Sexual Activity  . Alcohol use: Not Currently    Comment: occasionally  . Drug use: No  . Sexual activity: Not Currently  Other Topics Concern  . Not on file  Social History Narrative  . Not on file   Social Determinants of Health   Financial Resource Strain: Not on file  Food Insecurity: Not on file  Transportation Needs: Not on file  Physical Activity: Not on file  Stress: Not on file  Social Connections: Not on file  Intimate Partner Violence: Not on file    Outpatient Medications Prior  to Visit  Medication Sig Dispense Refill  . cloNIDine (CATAPRES) 0.1 MG tablet Take 1 tablet (0.1 mg total) by mouth 3 (three) times daily. 270 tablet 3  . hydrALAZINE (APRESOLINE) 50 MG tablet TAKE 2 TABLETS (100 MG TOTAL) BY MOUTH 3 (THREE) TIMES DAILY. 180 tablet 2  . nitroGLYCERIN (NITROSTAT) 0.4 MG SL tablet Place 1 tablet (0.4 mg total) under the tongue every 5 (five) minutes as needed for chest pain (CP or SOB). 30 tablet 0  . oxyCODONE-acetaminophen (PERCOCET) 10-325 MG tablet Take 1 tablet by mouth every 6 (six) hours as needed. For pain. 8 tablet 0  . gabapentin (NEURONTIN) 300 MG capsule TAKE 1 CAPSULE (300 MG TOTAL) BY MOUTH 2 (TWO) TIMES DAILY. 60 capsule 3  . traZODone (DESYREL) 100 MG tablet Take 1 tablet (100 mg total) by mouth at bedtime as needed for sleep. Take 1/2 to 1  tablet at bedtime for insomnia 90 tablet 1  . acetaminophen (TYLENOL) 500 MG tablet Take 1,000 mg by mouth every 6 (six) hours as needed (for pain/headaches.).    Marland Kitchen aspirin EC 81 MG tablet Take 81 mg by mouth every evening. (Patient not taking: Reported on 05/27/2020)    . buPROPion (WELLBUTRIN XL) 150 MG 24 hr tablet Take 1 tablet (150 mg total) by mouth daily. (Patient not taking: No sig reported) 90 tablet 1  . cetirizine (ZYRTEC) 10 MG tablet Take 1 tablet (10 mg total) by mouth daily. (Patient not taking: No sig reported) 90 tablet 6  . fluticasone (FLONASE) 50 MCG/ACT nasal spray Place 2 sprays into both nostrils daily. (Patient not taking: Reported on 05/27/2020) 16 g 6  . rOPINIRole (REQUIP) 0.25 MG tablet Take 1 tablet (0.25 mg total) by mouth at bedtime. (Patient not taking: No sig reported) 90 tablet 0  . traMADol (ULTRAM) 50 MG tablet Take 1 tablet (50 mg total) by mouth every 6 (six) hours as needed. (Patient not taking: No sig reported) 15 tablet 0   No facility-administered medications prior to visit.    Allergies  Allergen Reactions  . Influenza Vaccines     Chest congestion, fever  . Morphine And Related Shortness Of Breath and Other (See Comments)    Hallucination  Tolerates Norco/Vicodin  . Pneumococcal Vaccines Nausea And Vomiting    FEVER    ROS Review of Systems  Eyes: Positive for visual disturbance.  Musculoskeletal: Positive for back pain, neck pain and neck stiffness.  Psychiatric/Behavioral: Positive for behavioral problems. The patient is nervous/anxious.        PTSD  All other systems reviewed and are negative.     Objective:    Physical Exam Vitals reviewed.  Constitutional:      Appearance: Normal appearance.  HENT:     Right Ear: External ear normal.     Left Ear: External ear normal.  Cardiovascular:     Rate and Rhythm: Normal rate and regular rhythm.  Pulmonary:     Effort: Pulmonary effort is normal.     Breath sounds: Normal breath  sounds.  Abdominal:     General: Bowel sounds are normal.  Musculoskeletal:        General: Normal range of motion.     Cervical back: Normal range of motion.  Skin:    General: Skin is warm and dry.  Neurological:     Mental Status: He is oriented to person, place, and time.  Psychiatric:        Mood and Affect: Mood normal.  Behavior: Behavior normal.        Thought Content: Thought content normal.        Judgment: Judgment normal.     BP (!) 188/92   Pulse 89   Resp 16   Wt 198 lb (89.8 kg)   SpO2 100%   BMI 26.12 kg/m  Wt Readings from Last 3 Encounters:  05/27/20 198 lb (89.8 kg)  04/29/20 197 lb (89.4 kg)  04/23/20 201 lb 3.2 oz (91.3 kg)     Health Maintenance Due  Topic Date Due  . Hepatitis C Screening  Never done  . PNEUMOCOCCAL POLYSACCHARIDE VACCINE AGE 48-64 HIGH RISK  Never done  . COVID-19 Vaccine (1) Never done  . OPHTHALMOLOGY EXAM  Never done  . COLONOSCOPY (Pts 45-58yrs Insurance coverage will need to be confirmed)  Never done  . HEMOGLOBIN A1C  03/27/2020    There are no preventive care reminders to display for this patient.  Lab Results  Component Value Date   TSH 1.016 07/12/2014   Lab Results  Component Value Date   WBC 4.1 05/27/2020   HGB 8.9 (L) 05/27/2020   HCT 26.4 (L) 05/27/2020   MCV 93 05/27/2020   PLT 185 05/27/2020   Lab Results  Component Value Date   NA 138 04/23/2020   K 5.2 04/23/2020   CO2 16 (L) 04/23/2020   GLUCOSE 84 04/23/2020   BUN 49 (H) 04/23/2020   CREATININE 7.32 (H) 04/23/2020   BILITOT 0.4 04/23/2020   ALKPHOS 313 (H) 04/23/2020   AST 13 04/23/2020   ALT 15 04/23/2020   PROT 6.7 04/23/2020   ALBUMIN 3.6 (L) 04/23/2020   CALCIUM 8.4 (L) 04/23/2020   ANIONGAP 10 10/03/2019   Lab Results  Component Value Date   CHOL 192 04/23/2020   Lab Results  Component Value Date   HDL 50 04/23/2020   Lab Results  Component Value Date   LDLCALC 128 (H) 04/23/2020   Lab Results  Component Value  Date   TRIG 74 04/23/2020   Lab Results  Component Value Date   CHOLHDL 3.8 04/23/2020   Lab Results  Component Value Date   HGBA1C 4.7 09/25/2019      Assessment & Plan:  Diagnoses and all orders for this visit:  CKD (chronic kidney disease), stage IV (HCC) -     CBC with Differential followed by nephrology   Essential hypertension blood pressure goal is difficult with underlying CKD IV of less than 130/80, low-sodium, DASH diet, medication compliance, 150 minutes of moderate intensity exercise per week. Discussed medication compliance, adverse effects.  Type 2 diabetes mellitus without complication, without long-term current use of insulin (HCC) A1C 4.7 per ADA guideline is not a diabetic. (resolved will monitor)  Anemia, unspecified type Previous H/H low recheck for any changes  -     CBC with Differential  Primary insomnia -     traZODone (DESYREL) 100 MG tablet; Take 1 tablet (100 mg total) by mouth at bedtime as needed for sleep. Take 1/2 to 1 tablet at bedtime for insomnia  Other orders -     gabapentin (NEURONTIN) 300 MG capsule; Take 1 capsule (300 mg total) by mouth 2 (two) times daily.   Meds ordered this encounter  Medications  . gabapentin (NEURONTIN) 300 MG capsule    Sig: Take 1 capsule (300 mg total) by mouth 2 (two) times daily.    Dispense:  180 capsule    Refill:  1  . traZODone (DESYREL) 100  MG tablet    Sig: Take 1 tablet (100 mg total) by mouth at bedtime as needed for sleep. Take 1/2 to 1 tablet at bedtime for insomnia    Dispense:  90 tablet    Refill:  1    Follow-up: No follow-ups on file.    Kerin Perna, NP

## 2020-05-27 NOTE — Telephone Encounter (Signed)
Copied from [place] [phone]. Topic: Referral - Request for Referral >> May 26, 2020  1:44 PM Lennox Solders wrote: Has patient seen PCP for this complaint? Yes Reason for referral: Pt has found pain management place that is in his [facility] . [facility] phone number [phone]. PT is having back pain and neuropathy in knees, ankle , leg and feet. [facility] not accept [facility]   Please place a NEW pain management referral.

## 2020-05-27 NOTE — Telephone Encounter (Signed)
Upon checking out from today's visit, patient requested to speak to [facility] about a new pain management referral to a clinic that accepts [facility].

## 2020-05-28 DIAGNOSIS — M79604 Pain in right leg: Secondary | ICD-10-CM | POA: Diagnosis not present

## 2020-05-28 DIAGNOSIS — J301 Allergic rhinitis due to pollen: Secondary | ICD-10-CM | POA: Diagnosis not present

## 2020-05-28 DIAGNOSIS — M79605 Pain in left leg: Secondary | ICD-10-CM | POA: Diagnosis not present

## 2020-05-28 DIAGNOSIS — M79671 Pain in right foot: Secondary | ICD-10-CM | POA: Diagnosis not present

## 2020-05-28 DIAGNOSIS — J3081 Allergic rhinitis due to animal (cat) (dog) hair and dander: Secondary | ICD-10-CM | POA: Diagnosis not present

## 2020-05-28 DIAGNOSIS — M25572 Pain in left ankle and joints of left foot: Secondary | ICD-10-CM | POA: Diagnosis not present

## 2020-05-28 DIAGNOSIS — M545 Low back pain, unspecified: Secondary | ICD-10-CM | POA: Diagnosis not present

## 2020-05-28 DIAGNOSIS — M25571 Pain in right ankle and joints of right foot: Secondary | ICD-10-CM | POA: Diagnosis not present

## 2020-05-28 DIAGNOSIS — J309 Allergic rhinitis, unspecified: Secondary | ICD-10-CM | POA: Diagnosis not present

## 2020-05-28 DIAGNOSIS — J3089 Other allergic rhinitis: Secondary | ICD-10-CM | POA: Diagnosis not present

## 2020-05-28 DIAGNOSIS — R208 Other disturbances of skin sensation: Secondary | ICD-10-CM | POA: Diagnosis not present

## 2020-05-28 DIAGNOSIS — M79672 Pain in left foot: Secondary | ICD-10-CM | POA: Diagnosis not present

## 2020-05-28 LAB — CBC WITH DIFFERENTIAL/PLATELET
Basophils Absolute: 0 10*3/uL (ref 0.0–0.2)
Basos: 1 %
EOS (ABSOLUTE): 0.1 10*3/uL (ref 0.0–0.4)
Eos: 3 %
Hematocrit: 26.4 % — ABNORMAL LOW (ref 37.5–51.0)
Hemoglobin: 8.9 g/dL — ABNORMAL LOW (ref 13.0–17.7)
Immature Grans (Abs): 0 10*3/uL (ref 0.0–0.1)
Immature Granulocytes: 0 %
Lymphocytes Absolute: 0.9 10*3/uL (ref 0.7–3.1)
Lymphs: 21 %
MCH: 31.4 pg (ref 26.6–33.0)
MCHC: 33.7 g/dL (ref 31.5–35.7)
MCV: 93 fL (ref 79–97)
Monocytes Absolute: 0.4 10*3/uL (ref 0.1–0.9)
Monocytes: 10 %
Neutrophils Absolute: 2.6 10*3/uL (ref 1.4–7.0)
Neutrophils: 65 %
Platelets: 185 10*3/uL (ref 150–450)
RBC: 2.83 x10E6/uL — ABNORMAL LOW (ref 4.14–5.80)
RDW: 13.8 % (ref 11.6–15.4)
WBC: 4.1 10*3/uL (ref 3.4–10.8)

## 2020-05-28 NOTE — Telephone Encounter (Signed)
Pain management decline referral inform patient at his appt 05/28/20

## 2020-06-03 ENCOUNTER — Inpatient Hospital Stay (HOSPITAL_COMMUNITY): Admission: RE | Admit: 2020-06-03 | Payer: Medicaid Other | Source: Ambulatory Visit

## 2020-06-17 ENCOUNTER — Encounter (HOSPITAL_COMMUNITY): Payer: Medicaid Other

## 2020-06-25 ENCOUNTER — Telehealth (INDEPENDENT_AMBULATORY_CARE_PROVIDER_SITE_OTHER): Payer: Medicaid Other | Admitting: Cardiology

## 2020-06-25 ENCOUNTER — Encounter: Payer: Self-pay | Admitting: Cardiology

## 2020-06-25 DIAGNOSIS — E1140 Type 2 diabetes mellitus with diabetic neuropathy, unspecified: Secondary | ICD-10-CM | POA: Diagnosis not present

## 2020-06-25 DIAGNOSIS — J3081 Allergic rhinitis due to animal (cat) (dog) hair and dander: Secondary | ICD-10-CM | POA: Diagnosis not present

## 2020-06-25 DIAGNOSIS — I132 Hypertensive heart and chronic kidney disease with heart failure and with stage 5 chronic kidney disease, or end stage renal disease: Secondary | ICD-10-CM | POA: Diagnosis not present

## 2020-06-25 DIAGNOSIS — M79605 Pain in left leg: Secondary | ICD-10-CM | POA: Diagnosis not present

## 2020-06-25 DIAGNOSIS — J309 Allergic rhinitis, unspecified: Secondary | ICD-10-CM | POA: Diagnosis not present

## 2020-06-25 DIAGNOSIS — M25571 Pain in right ankle and joints of right foot: Secondary | ICD-10-CM | POA: Diagnosis not present

## 2020-06-25 DIAGNOSIS — J301 Allergic rhinitis due to pollen: Secondary | ICD-10-CM | POA: Diagnosis not present

## 2020-06-25 DIAGNOSIS — E782 Mixed hyperlipidemia: Secondary | ICD-10-CM

## 2020-06-25 DIAGNOSIS — N185 Chronic kidney disease, stage 5: Secondary | ICD-10-CM

## 2020-06-25 DIAGNOSIS — G894 Chronic pain syndrome: Secondary | ICD-10-CM | POA: Diagnosis not present

## 2020-06-25 DIAGNOSIS — I5032 Chronic diastolic (congestive) heart failure: Secondary | ICD-10-CM

## 2020-06-25 DIAGNOSIS — R9439 Abnormal result of other cardiovascular function study: Secondary | ICD-10-CM | POA: Diagnosis not present

## 2020-06-25 DIAGNOSIS — M79604 Pain in right leg: Secondary | ICD-10-CM | POA: Diagnosis not present

## 2020-06-25 DIAGNOSIS — IMO0002 Reserved for concepts with insufficient information to code with codable children: Secondary | ICD-10-CM

## 2020-06-25 DIAGNOSIS — J3089 Other allergic rhinitis: Secondary | ICD-10-CM | POA: Diagnosis not present

## 2020-06-25 DIAGNOSIS — M79671 Pain in right foot: Secondary | ICD-10-CM | POA: Diagnosis not present

## 2020-06-25 DIAGNOSIS — M79672 Pain in left foot: Secondary | ICD-10-CM | POA: Diagnosis not present

## 2020-06-25 DIAGNOSIS — M545 Low back pain, unspecified: Secondary | ICD-10-CM | POA: Diagnosis not present

## 2020-06-25 DIAGNOSIS — M25572 Pain in left ankle and joints of left foot: Secondary | ICD-10-CM | POA: Diagnosis not present

## 2020-06-25 DIAGNOSIS — F17200 Nicotine dependence, unspecified, uncomplicated: Secondary | ICD-10-CM

## 2020-06-25 DIAGNOSIS — E1165 Type 2 diabetes mellitus with hyperglycemia: Secondary | ICD-10-CM

## 2020-06-25 NOTE — Assessment & Plan Note (Signed)
Clearly limited by walking because of his peripheral neuropathy.  Also diabetic retinopathy and diabetic kidney disease.Marland Kitchen He takes gabapentin for his neuropathy, bilateral see any diabetes therapy.  I suspect that he could potentially benefit from seeing an endocrinologist, but will defer to PCP for the call.  In theory nephrology could also handle.

## 2020-06-25 NOTE — Progress Notes (Signed)
Virtual Visit via Telephone Note   This visit type was conducted due to national recommendations for restrictions regarding the COVID-19 Pandemic (e.g. social distancing) in an effort to limit this patient's exposure and mitigate transmission in our community.  Due to his co-morbid illnesses, this patient is at least at moderate risk for complications without adequate follow up.  This format is felt to be most appropriate for this patient at this time.  The patient did not have access to video technology/had technical difficulties with video requiring transitioning to audio format only (telephone).  All issues noted in this document were discussed and addressed.  No physical exam could be performed with this format.  Please refer to the patient's chart for his  consent to telehealth for [facility].   Patient has given verbal permission to conduct this visit via virtual appointment and to bill insurance 06/25/2020 11:21 AM     Video visit not done because the patient is legally blind.  He did not have anybody at home available to help him set up the video.  Evaluation Performed:  Follow-up visit  Date:  06/25/2020   ID:  Cody Webster, DOB Feb 07, 1966, MRN [id number]  Patient Location: Home Provider Location: Other:  Hospital office  PCP:  Kerin Perna, NP  Cardiologist:  Glenetta Hew, MD  Electrophysiologist:  None     Chief Complaint  Patient presents with  . Follow-up    No active cardiac symptoms, -> changing from [facility] to [facility] for transplant evaluation.  Not yet on dialysis.  . Congestive Heart Failure    He is concerned about having a diagnosis of congestive heart rate listed.  He is not having symptoms of heart failure-no orthopnea, PND or edema. 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  He does have moderate LVH and does have hypertension that has equivocal control.  Interestingly, the echocardiogram done at [facility] read the EF is 40% despite the fact that the nuclear stress test was read as 55%. => With equivocal numbers, I would like to reassess with an echocardiogram here to determine what his EF is prior to him getting to [facility] and trying to get on the transplant list.  He is on clonidine and hydralazine which is quite an interesting combination.  I have asked that he not use the clonidine as a standing medication since it seems to really drop his blood pressures.  He we do need to get him to check his blood pressures at home.  I would prefer to use a beta-blocker in light of his hypertensive heart disease.  He is not having any edema, PND orthopnea, therefore volume status seems okay without diuretic.  Plan: For now continue hydralazine.  Monitor blood pressures so we can readjust medications when I see him in return.  Recheck 2D echocardiogram to reevaluate EF (EF 40% seems unlikely with lack of CHF symptoms, and Myoview reading EF is 55%)      Relevant Orders   ECHOCARDIOGRAM COMPLETE   Tobacco use disorder (Chronic)    We talked about the importance of smoking cessation counseling.  He is  quite frustrated with his health conditions, but does not seem to be yet ready to quit.  Will be discussed in follow-up.      DM type 2, uncontrolled, with neuropathy (HCC) (Chronic)    Clearly limited by walking because of his peripheral neuropathy.  Also diabetic retinopathy and diabetic kidney disease.Marland Kitchen He takes gabapentin for his neuropathy, bilateral see any diabetes therapy.  I suspect that he could potentially benefit from seeing an endocrinologist, but will defer to PCP for the  call.  In theory nephrology could also handle.      Relevant Orders   Lipid panel   Abnormal cardiovascular stress test (Chronic)    He had again another stress test suggesting a possible defect.  I read the report, there was mention of a completely reversible anterior defect.  Unfortunately, not able to see the images. EF on the Myoview actually was normal where his echocardiogram showed reduced EF.  In the absence of any ongoing anginal symptoms, I do not see any reason to consider invasive evaluation.  Especially, in light of his renal insufficiency I will try to avoid contrast      Relevant Orders   ECHOCARDIOGRAM COMPLETE   Hyperlipidemia (Chronic)    He is due to have lipids checked.  I will see that he can get fasting lipids checked when he comes back to see me after his echocardiogram.      Relevant Orders   Lipid panel     ====================================  History of Present Illness:    Cody Webster is a 55 y.o. male with PMH notable for ESRD-not on HD, Severe Diabetes with Diabetic Retinopathy leading to Blindness & with severe PN (limiting walking), Hypertension and Hypertensive Heart Disease presents via audio/video conferencing for a telehealth visit today as a annual follow-up.  Cody Webster was last seen on May 20, 2019 -> he did not note any symptoms to suggest any ischemic CAD such as chest pain or pressure with rest or exertion.  Not very active because of his diabetic neuropathy in his feet as well as poor balance and poor vision.  Very deconditioned.  A few palpitations but that is all.  Did not walk enough to notice claudication. => In 2019 he had a Myoview that showed possible prior infarct with peri-infarct ischemia but was read as low risk.  Echocardiogram at that time showed normal EF with normal wall motion.  With the lack of symptoms, we chose to avoid ischemic evaluation (at that time he was not on dialysis)  Hospitalizations:  . n/a   Recent -  Interim CV studies:    The following studies were reviewed today: . Myoview 10/01/2019:  Small sized, mild severity reversible defect involving apical lateral & mid anterolateral wall thought to be artifacts - but CRO mild ischemia.  EF 55%.  . Echocardiogram 10/09/2019 - EF 40%. Mild MR. Mild Ao Calcification.   Inerval History   Cody Webster tells me he is doing pretty well overall from a cardiac standpoint.  He denies any chest pain or pressure with rest or exertion, although he does not really exert himself any.  He barely walks around the house-limited by pedal neuropathy.  Denies any claudication symptoms just foot pain.  He is also pretty much legally blind and therefore is fearful of walking because of bumping into things.  He just basal he tells me he is tired all the time, extremely fatigued.  He was told that this is probably related to his kidney  disease and anemia.  He denies any syncope near syncope, but he does say that his pressures drop dramatically when he takes clonidine.  As such, he really has stopped taking clonidine unless he feels like his pressures may be up and then he takes dose of clonidine.  Within a half an hour he will start feeling dizzy.  He does fine with hydralazine, but has become frustrated with other care providers to tell him that he needs to take meds.  The clonidine makes him feel tired and dizzy.  He therefore is not taking it routinely.  This is the only blood pressure medicine she is taking.   Cardiovascular ROS: no chest pain or dyspnea on exertion positive for - Lightheadedness with change in blood pressures after taking BP meds.  Fatigue, lack of drive. negative for - edema, orthopnea, palpitations, paroxysmal nocturnal dyspnea, rapid heart rate, shortness of breath or True syncope, TIA or amaurosis fugax. He does not walk enough to no claudication.     ROS:  Please see the history of present illness.    The patient does not have symptoms concerning  for COVID-19 infection (fever, chills, cough, or new shortness of breath).  Review of Systems  Constitutional: Positive for malaise/fatigue (No energy.  Feels tired all the time). Negative for weight loss.  HENT: Negative for congestion.   Eyes:       Essentially now legally blind  Respiratory: Positive for cough (Of and on.  Worse in the morning.). Negative for shortness of breath and wheezing.   Cardiovascular: Negative for claudication and leg swelling.  Genitourinary: Negative for hematuria.       He still makes urine  Musculoskeletal: Positive for joint pain. Negative for falls.  Neurological: Positive for dizziness (Associated with clonidine) and tingling.       Pretty significant pedal neuropathy.  Very poor balance.  Difficult to walk.  Psychiatric/Behavioral: Positive for depression. Negative for suicidal ideas. The patient is not nervous/anxious and does not have insomnia.     The patient is practicing social distancing.  Past Medical History:  Diagnosis Date  . Anemia   . Carotid artery occlusion   . 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 Diabetes mellitus without complication ([place])    type 2  . GERD (gastroesophageal reflux disease)   . Hypertension   . 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 ([place]) 2015   EF now improved back to normal mildly reduced  . Peripheral neuropathy   . Pneumonia   . PTSD (post-traumatic stress disorder)   . Seizures ([place])    27 years ago    Past Surgical History:  Procedure Laterality Date  . AV FISTULA PLACEMENT Left 11/08/2016   Procedure: ARTERIOVENOUS (AV) FISTULA CREATION;  Surgeon: Angelia Mould, MD;  Location: [place];  Service: Vascular;  Laterality: Left;  . AV FISTULA PLACEMENT Left 03/23/2020   Procedure: LEFT BASILIC VEIN FISTULA CREATION;  Surgeon: Angelia Mould, MD;  Location: [place];  Service: Vascular;  Laterality: Left;  . NM  MYOVIEW LTD  07/2017   "Moderate sized moderate severity" (on cardiology was very small size, small intensity) partially reversible inferoapical//inferoseptal defect.  (Read as intermediate-high risk) -> over read by cardiology as LOW RISK  . [place]  09/2019   [facility] Small sized, mild severity reversible defect involving apical lateral & mid anterolateral wall thought to be artifacts -  but CRO mild ischemia.  EF 55%. => Read as LOW RISK:  . right foot surgery    . TOE AMPUTATION    . TRANSTHORACIC ECHOCARDIOGRAM  07/2017   EF 60%.,  Moderate LVH.  GR 1 DD.  No R WMA.  Mild RV and moderate RA dilation.  . TRANSTHORACIC ECHOCARDIOGRAM  10/03/2019   [facility]: EF 40%, mild MR, mild aortic calcification     Current Meds  Medication Sig  . acetaminophen (TYLENOL) 500 MG tablet Take 1,000 mg by mouth every 6 (six) hours as needed (for pain/headaches.).  Marland Kitchen aspirin EC 81 MG tablet Take 81 mg by mouth every evening.  . cetirizine (ZYRTEC) 10 MG tablet Take 1 tablet (10 mg total) by mouth daily.  . cloNIDine (CATAPRES) 0.1 MG tablet Take 1 tablet (0.1 mg total) by mouth 3 (three) times daily. (Patient taking differently: Take 0.1 mg by mouth 3 (three) times daily as needed.)  . fluticasone (FLONASE) 50 MCG/ACT nasal spray Place 2 sprays into both nostrils daily.  Marland Kitchen gabapentin (NEURONTIN) 300 MG capsule Take 1 capsule (300 mg total) by mouth 2 (two) times daily.  . hydrALAZINE (APRESOLINE) 50 MG tablet TAKE 2 TABLETS (100 MG TOTAL) BY MOUTH 3 (THREE) TIMES DAILY. (Patient taking differently: Take 100 mg by mouth 3 (three) times daily as needed.)  . nitroGLYCERIN (NITROSTAT) 0.4 MG SL tablet Place 1 tablet (0.4 mg total) under the tongue every 5 (five) minutes as needed for chest pain (CP or SOB).  Marland Kitchen oxyCODONE-acetaminophen (PERCOCET) 10-325 MG tablet Take 1 tablet by mouth every 6 (six) hours as needed. For pain.  . traZODone (DESYREL) 100 MG tablet Take 1 tablet (100 mg total) by mouth  at bedtime as needed for sleep. Take 1/2 to 1 tablet at bedtime for insomnia     Allergies:   Morphine and related   Social History   Tobacco Use  . Smoking status: Current Every Day Smoker    Packs/day: 0.50    Years: 30.00    Pack years: 15.00    Types: Cigarettes  . Smokeless tobacco: Former Systems developer    Types: Snuff, Sarina Ser    Quit date: 12/11/1983  Vaping Use  . Vaping Use: Never used  Substance Use Topics  . Alcohol use: Not Currently    Comment: occasionally  . Drug use: No     Family Hx: The patient's family history includes CAD in his father, mother, and sister; Diabetes type II in his brother and father; Heart attack in his mother; Hypertension in his sister; Stroke in his father.   Labs/Other Tests and Data Reviewed:    EKG:  No ECG reviewed.  Recent Labs: 04/23/2020: ALT 15; BUN 49; Creatinine, Ser 7.32; Potassium 5.2; Sodium 138 05/27/2020: Hemoglobin 8.9; Platelets 185   Recent Lipid Panel Lab Results  Component Value Date/Time   CHOL 192 04/23/2020 09:55 AM   TRIG 74 04/23/2020 09:55 AM   HDL 50 04/23/2020 09:55 AM   CHOLHDL 3.8 04/23/2020 09:55 AM   CHOLHDL 3.6 07/21/2017 05:43 AM   CHOLHDL 3.9 07/21/2017 05:43 AM   LDLCALC 128 (H) 04/23/2020 09:55 AM    Wt Readings from Last 3 Encounters:  05/27/20 198 lb (89.8 kg)  04/29/20 197 lb (89.4 kg)  04/23/20 201 lb 3.2 oz (91.3 kg)     Objective:    Vital Signs:  There were no vitals taken for this visit.  VITAL SIGNS:  reviewed pleasant male in no acute distress. A&O x 3.  normal Mood & Affect Non-labored respirations  ==========================================  COVID-19 Education: The signs and symptoms of COVID-19 were discussed with the patient and how to seek care for testing (follow up with PCP or arrange E-visit).   The importance of social distancing was discussed today.  Time:   Today, I have spent 32 minutes with the patient with telehealth technology discussing the above problems.   An  additional  9 minutes spent charting (reviewing prior notes, hospital records, studies, labs etc.) Total 41 minutes   Medication Adjustments/Labs and Tests Ordered: Current medicines are reviewed at length with the patient today.  Concerns regarding medicines are outlined above.   Patient Instructions  Medication Instructions:   For now, I would say use the clonidine more in a as needed basis if your systolic blood pressure (the top number) is greater than 140 to 150 mmHg  *If you need a refill on your cardiac medications before your next appointment, please call your pharmacy*   Lab Work:  We did not talk about this, but it when you come back for follow-up appointment, come in fasting so we can check your cholesterol levels.  If you have labs (blood work) drawn today and your tests are completely normal, you will receive your results only by: Marland Kitchen MyChart Message (if you have MyChart) OR . A paper copy in the mail If you have any lab test that is abnormal or we need to change your treatment, we will call you to review the results.   Testing/Procedures:   2D echocardiogram   Follow-Up: At [facility], you and your health needs are our priority.  As part of our continuing mission to provide you with exceptional heart care, we have created designated Provider Care Teams.  These Care Teams include your primary Cardiologist (physician) and Advanced Practice Providers (APPs -  Physician Assistants and Nurse Practitioners) who all work together to provide you with the care you need, when you need it.  We recommend signing up for the patient portal called "MyChart".  Sign up information is provided on this After Visit Summary.  MyChart is used to connect with patients for Virtual Visits (Telemedicine).  Patients are able to view lab/test results, encounter notes, upcoming appointments, etc.  Non-urgent messages can be sent to your provider as well.   To learn more about what you can do  with MyChart, go to [email or web address].    Your next appointment:   1-2 month(s) -> but after the echocardiogram  The format for your next appointment:   In Person  Provider:   Glenetta Hew, MD   Other Instructions N/A     Signed, Glenetta Hew, MD  06/25/2020 11:21 AM    [place]

## 2020-06-25 NOTE — Assessment & Plan Note (Signed)
He had again another stress test suggesting a possible defect.  I read the report, there was mention of a completely reversible anterior defect.  Unfortunately, not able to see the images. EF on the Myoview actually was normal where his echocardiogram showed reduced EF.  In the absence of any ongoing anginal symptoms, I do not see any reason to consider invasive evaluation.  Especially, in light of his renal insufficiency I will try to avoid contrast

## 2020-06-25 NOTE — Patient Instructions (Signed)
Medication Instructions:   For now, I would say use the clonidine more in a as needed basis if your systolic blood pressure (the top number) is greater than 140 to 150 mmHg  *If you need a refill on your cardiac medications before your next appointment, please call your pharmacy*   Lab Work:  We did not talk about this, but it when you come back for follow-up appointment, come in fasting so we can check your cholesterol levels.  If you have labs (blood work) drawn today and your tests are completely normal, you will receive your results only by: Marland Kitchen MyChart Message (if you have MyChart) OR . A paper copy in the mail If you have any lab test that is abnormal or we need to change your treatment, we will call you to review the results.   Testing/Procedures:   2D echocardiogram   Follow-Up: At [facility], you and your health needs are our priority.  As part of our continuing mission to provide you with exceptional heart care, we have created designated Provider Care Teams.  These Care Teams include your primary Cardiologist (physician) and Advanced Practice Providers (APPs -  Physician Assistants and Nurse Practitioners) who all work together to provide you with the care you need, when you need it.  We recommend signing up for the patient portal called "MyChart".  Sign up information is provided on this After Visit Summary.  MyChart is used to connect with patients for Virtual Visits (Telemedicine).  Patients are able to view lab/test results, encounter notes, upcoming appointments, etc.  Non-urgent messages can be sent to your provider as well.   To learn more about what you can do with MyChart, go to [email or web address].    Your next appointment:   1-2 month(s) -> but after the echocardiogram  The format for your next appointment:   In Person  Provider:   Glenetta Hew, MD   Other Instructions N/A

## 2020-06-25 NOTE — Assessment & Plan Note (Signed)
He is due to have lipids checked.  I will see that he can get fasting lipids checked when he comes back to see me after his echocardiogram.

## 2020-06-25 NOTE — Assessment & Plan Note (Signed)
We talked about the importance of smoking cessation counseling.  He is quite frustrated with his health conditions, but does not seem to be yet ready to quit.  Will be discussed in follow-up.

## 2020-06-25 NOTE — Assessment & Plan Note (Addendum)
Previous echocardiograms have shown normal EF with only grade 1 or grade 2 diastolic dysfunction.  He does have moderate LVH and does have hypertension that has equivocal control.  Interestingly, the echocardiogram done at [facility] read the EF is 40% despite the fact that the nuclear stress test was read as 55%. => With equivocal numbers, I would like to reassess with an echocardiogram here to determine what his EF is prior to him getting to [facility] and trying to get on the transplant list.  He is on clonidine and hydralazine which is quite an interesting combination.  I have asked that he not use the clonidine as a standing medication since it seems to really drop his blood pressures.  He we do need to get him to check his blood pressures at home.  I would prefer to use a beta-blocker in light of his hypertensive heart disease.  He is not having any edema, PND orthopnea, therefore volume status seems okay without diuretic.  Plan: For now continue hydralazine.  Monitor blood pressures so we can readjust medications when I see him in return.  Recheck 2D echocardiogram to reevaluate EF (EF 40% seems unlikely with lack of CHF symptoms, and Myoview reading EF is 55%)

## 2020-07-01 ENCOUNTER — Telehealth: Payer: Self-pay | Admitting: *Deleted

## 2020-07-01 DIAGNOSIS — N185 Chronic kidney disease, stage 5: Secondary | ICD-10-CM | POA: Diagnosis not present

## 2020-07-01 DIAGNOSIS — N2581 Secondary hyperparathyroidism of renal origin: Secondary | ICD-10-CM | POA: Diagnosis not present

## 2020-07-01 DIAGNOSIS — N189 Chronic kidney disease, unspecified: Secondary | ICD-10-CM | POA: Diagnosis not present

## 2020-07-01 NOTE — Telephone Encounter (Signed)
RN -received  Message    Arelia Sneddon, Lexine Baton, RN  P Cv Div Nl Pcc Cc: Raiford Simmonds, RN Hello --   This patient of Dr. Ellyn Hack needs an echo appt, fasting lab work same day (so AM echo) and f/u with Dr. Ellyn Hack in 1-2 months after testing.   Thanks    patient was schedule for  March 4.2022 for Echo  RN called patient an schedule follow up appointment with Dr Ellyn Hack for May 5,2022 at 11:20 am. Patient verbalized understanding.

## 2020-07-13 ENCOUNTER — Other Ambulatory Visit: Payer: Self-pay

## 2020-07-13 ENCOUNTER — Ambulatory Visit (AMBULATORY_SURGERY_CENTER): Payer: Medicaid Other

## 2020-07-13 VITALS — Ht 73.0 in | Wt 207.0 lb

## 2020-07-13 DIAGNOSIS — Z1211 Encounter for screening for malignant neoplasm of colon: Secondary | ICD-10-CM

## 2020-07-13 MED ORDER — NA SULFATE-K SULFATE-MG SULF 17.5-3.13-1.6 GM/177ML PO SOLN: 1.0000 | Freq: Once | ORAL | 0 refills | Status: AC

## 2020-07-13 NOTE — Progress Notes (Signed)
No allergies to soy or egg Pt is not on blood thinners or diet pills Denies issues with sedation/intubation Denies atrial flutter/fib Denies constipation   Emmi instructions given to pt  Pt is aware of Covid safety and care partner requirements.   Minna Merritts, CNA   Hx of seizure-last one > than 20 yrs   Pt is legally blind.  CMA Co-signed documents with pt  Pt is legally blind, but CMA is able to guide him and will be available to guide pt to follow instructions. Is able to walk with just needing guidance due to his sight.

## 2020-07-16 ENCOUNTER — Encounter: Payer: Self-pay | Admitting: Orthopedic Surgery

## 2020-07-16 ENCOUNTER — Ambulatory Visit (INDEPENDENT_AMBULATORY_CARE_PROVIDER_SITE_OTHER): Payer: Medicaid Other | Admitting: Physician Assistant

## 2020-07-16 DIAGNOSIS — B351 Tinea unguium: Secondary | ICD-10-CM

## 2020-07-16 DIAGNOSIS — M79671 Pain in right foot: Secondary | ICD-10-CM | POA: Diagnosis not present

## 2020-07-16 NOTE — Progress Notes (Signed)
Office Visit Note   Patient: Cody Webster           Date of Birth: 1965/12/08           MRN: [id number] Visit Date: 07/16/2020              Requested by: Kerin Perna, NP [address] [place],  [place] [address] PCP: Kerin Perna, NP  Chief Complaint  Patient presents with  . Right Foot - Pain  . Left Foot - Pain      HPI: Patient is a pleasant 55 year old gentleman with a history of diabetes and neuropathy.  He is status post right fifth toe amputation.  He comes in today because he caught the nail of his third toe on the right in his shoe and started to have some bleeding.  Also asking if we could trim his nails.  He also has a thickened callus on the left foot beneath the fifth MTP Assessment & Plan: Visit Diagnoses: No diagnosis found.  Plan: Patient should use a small amount of antibiotic ointment on the nailbed with the nail was removed with a Band-Aid.  Should keep this covered.  He is requesting a postop shoe so that he does not have any more rubbing of his shoes while this is healing and we have provided this to him today.  Also given a prescription for compression socks.  He will follow-up in 4 weeks.  Follow-Up Instructions: No follow-ups on file.   Ortho Exam  Patient is alert, oriented, no adenopathy, well-dressed, normal affect, normal respiratory effort. Focused examination of his right foot status post fifth ray amputation is well-healed.  No swelling no cellulitis.  The third toe the onychomycotic nail has separated from the nailbed.  There is no surrounding infection or cellulitis.  After obtaining verbal consent this was taken off.  He had good healthy bleeding tissue beneath.  Antibiotic ointment and a a Band-Aid were applied.  On the left foot he has some brawny skin changes no cellulitis no significant swelling although he does have swelling in the left lower extremity.  He has an extremely thickened callus beneath the fifth MTP.  After  obtaining verbal consent this was debrided down to a healthy soft surface.  There was no surrounding infection there was no open ulcer  Imaging: No results found. No images are attached to the encounter.  Labs: Lab Results  Component Value Date   HGBA1C 4.7 09/25/2019   HGBA1C 5.1 07/20/2017   HGBA1C 6.7 (H) 04/23/2016   ESRSEDRATE 25 (H) 12/10/2013   CRP <0.5 (L) 12/10/2013   REPTSTATUS 04/30/2016 FINAL 04/24/2016   REPTSTATUS 04/30/2016 FINAL 04/24/2016   CULT  04/24/2016    NO GROWTH 5 DAYS Performed at [facility]  04/24/2016    NO GROWTH 5 DAYS Performed at [facility]      Lab Results  Component Value Date   ALBUMIN 3.6 (L) 04/23/2020   ALBUMIN 4.0 08/30/2017   ALBUMIN 3.4 (L) 07/20/2017    Lab Results  Component Value Date   MG 1.7 04/27/2016   MG 1.9 04/26/2016   MG 1.8 04/25/2016   No results found for: VD25OH  No results found for: PREALBUMIN CBC EXTENDED Latest Ref Rng & Units 05/27/2020 05/06/2020 04/22/2020  WBC 3.4 - 10.8 x10E3/uL 4.1 - -  RBC 4.14 - 5.80 x10E6/uL 2.83(L) - -  HGB 13.0 - 17.7 g/dL 8.9(L) 8.2(L) 7.1(L)  HCT 37.5 - 51.0 %  26.4(L) - -  PLT 150 - 450 x10E3/uL 185 - -  NEUTROABS 1.4 - 7.0 x10E3/uL 2.6 - -  LYMPHSABS 0.7 - 3.1 x10E3/uL 0.9 - -     There is no height or weight on file to calculate BMI.  Orders:  No orders of the defined types were placed in this encounter.  No orders of the defined types were placed in this encounter.    Procedures: No procedures performed  Clinical Data: No additional findings.  ROS:  All other systems negative, except as noted in the HPI. Review of Systems  Objective: Vital Signs: There were no vitals taken for this visit.  Specialty Comments:  No specialty comments available.  PMFS History: Patient Active Problem List   Diagnosis Date Noted  . OSA (obstructive sleep apnea) 12/05/2019  . Chronic foot pain, right 04/02/2018  . DDD (degenerative disc  disease), lumbar 04/02/2018  . Abnormal liver enzymes 03/30/2018  . Abnormal cardiovascular stress test 02/08/2018  . Preoperative cardiovascular examination 11/04/2016  . Malnutrition of moderate degree ([place]) 01/12/2015  . DM type 2, uncontrolled, with neuropathy ([place]) 01/11/2015  . Glaucoma   . Eye pain   . Acute glaucoma of right eye 09/06/2014  . Acute renal failure superimposed on chronic kidney disease ([place]) 09/06/2014  . Anemia 09/06/2014  . Tobacco use disorder 09/06/2014  . Frequent headaches 07/11/2014  . CKD (chronic kidney disease), stage V (HCC) -> not yet on hemodialysis 07/11/2014  . 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 Ataxia 07/11/2014  . CKD (chronic kidney disease) 12/11/2013  . Diabetic foot ulcer ([place]) 12/10/2013  . Essential hypertension 12/10/2013  . Peripheral neuropathy ([place]) 12/10/2013  . Pain, dental 10/08/2013  . Osteomyelitis of ankle or foot, right, acute ([place]) 08/30/2013  . Hyperlipidemia 10/20/2012   Past Medical History:  Diagnosis Date  . Anemia   . Carotid artery occlusion   . 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 Depression   . Diabetes mellitus without complication ([place])    type 2  . GERD (gastroesophageal reflux disease)   . Hypertension   . 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 ([place]) 2015   EF now improved back to normal mildly reduced  . Peripheral neuropathy   . Pneumonia   . PTSD (post-traumatic stress disorder)   . Seizures ([place])    27 years ago    Family History  Problem Relation Age of Onset  . CAD Mother   . Heart attack Mother   . Diabetes type II Father   . Stroke Father   . CAD Father   . Hypertension Sister   . CAD Sister   . Diabetes type II Brother   . Colon cancer Maternal Uncle        2015  . Colon polyps Neg Hx   . Esophageal cancer  Neg Hx   . Stomach cancer Neg Hx   . Rectal cancer Neg Hx     Past Surgical History:  Procedure Laterality Date  . AV FISTULA PLACEMENT Left 11/08/2016   Procedure: ARTERIOVENOUS (AV) FISTULA CREATION;  Surgeon: Angelia Mould, MD;  Location: [facility];  Service: Vascular;  Laterality: Left;  . AV FISTULA PLACEMENT Left 03/23/2020   Procedure: LEFT BASILIC VEIN FISTULA CREATION;  Surgeon: Angelia Mould, MD;  Location: [facility];  Service:  Vascular;  Laterality: Left;  . NM MYOVIEW LTD  07/2017   "Moderate sized moderate severity" (on cardiology was very small size, small intensity) partially reversible inferoapical//inferoseptal defect.  (Read as intermediate-high risk) -> over read by cardiology as LOW RISK  . [place]  09/2019   [facility] Small sized, mild severity reversible defect involving apical lateral & mid anterolateral wall thought to be artifacts - but CRO mild ischemia.  EF 55%. => Read as LOW RISK:  . right foot surgery    . TOE AMPUTATION    . TRANSTHORACIC ECHOCARDIOGRAM  07/2017   EF 60%.,  Moderate LVH.  GR 1 DD.  No R WMA.  Mild RV and moderate RA dilation.  . TRANSTHORACIC ECHOCARDIOGRAM  10/03/2019   [facility]: EF 40%, mild MR, mild aortic calcification   Social History   Occupational History    Comment: Disabled - for Back pain & foot ulcer  Tobacco Use  . Smoking status: Current Every Day Smoker    Packs/day: 0.50    Years: 30.00    Pack years: 15.00    Types: Cigarettes  . Smokeless tobacco: Former Systems developer    Types: Snuff, Sarina Ser    Quit date: 12/11/1983  Vaping Use  . Vaping Use: Never used  Substance and Sexual Activity  . Alcohol use: Not Currently    Comment: occasionally  . Drug use: No  . Sexual activity: Not Currently

## 2020-07-17 ENCOUNTER — Ambulatory Visit (HOSPITAL_COMMUNITY): Payer: Medicaid Other | Attending: Cardiology

## 2020-07-17 ENCOUNTER — Other Ambulatory Visit: Payer: Self-pay

## 2020-07-17 DIAGNOSIS — N185 Chronic kidney disease, stage 5: Secondary | ICD-10-CM | POA: Insufficient documentation

## 2020-07-17 DIAGNOSIS — R9439 Abnormal result of other cardiovascular function study: Secondary | ICD-10-CM | POA: Diagnosis not present

## 2020-07-17 DIAGNOSIS — I132 Hypertensive heart and chronic kidney disease with heart failure and with stage 5 chronic kidney disease, or end stage renal disease: Secondary | ICD-10-CM | POA: Diagnosis not present

## 2020-07-17 DIAGNOSIS — I5032 Chronic diastolic (congestive) heart failure: Secondary | ICD-10-CM | POA: Diagnosis not present

## 2020-07-17 LAB — ECHOCARDIOGRAM COMPLETE
Area-P 1/2: 2.68 cm2
S' Lateral: 3.8 cm

## 2020-07-23 ENCOUNTER — Telehealth: Payer: Self-pay

## 2020-07-23 NOTE — Telephone Encounter (Signed)
Copied from [facility] [phone] [id number]. Topic: General - Other >> Jul 23, 2020  1:58 PM Tessa Lerner A wrote: Reason for CRM: Patient is required to have paperwork that explains and verifies his disability in order to receive modifications to their apartment  Patient's landlord is requiring documentation in order to help make patient's bathroom handicap accessible   Please contact to advise further. Patient would like to have paperwork completed by 07/29/20   Patient shares they are able to pick up paperwork when it is completed

## 2020-07-24 ENCOUNTER — Encounter: Payer: Self-pay | Admitting: Gastroenterology

## 2020-07-24 ENCOUNTER — Ambulatory Visit (AMBULATORY_SURGERY_CENTER): Payer: Medicaid Other | Admitting: Gastroenterology

## 2020-07-24 ENCOUNTER — Other Ambulatory Visit: Payer: Self-pay

## 2020-07-24 VITALS — BP 156/77 | HR 83 | Temp 97.8°F | Resp 16 | Ht 73.0 in | Wt 207.0 lb

## 2020-07-24 DIAGNOSIS — D123 Benign neoplasm of transverse colon: Secondary | ICD-10-CM

## 2020-07-24 DIAGNOSIS — Z1211 Encounter for screening for malignant neoplasm of colon: Secondary | ICD-10-CM | POA: Diagnosis not present

## 2020-07-24 MED ORDER — SODIUM CHLORIDE 0.9 % IV SOLN
500.0000 mL | INTRAVENOUS | Status: DC
Start: 2020-07-24 — End: 2020-07-24

## 2020-07-24 NOTE — Telephone Encounter (Signed)
Attempted to reach patient about paperwork as nothing has been dropped off at clinic. His voicemail is not setup.

## 2020-07-24 NOTE — Patient Instructions (Signed)
Please read handouts provided. Continue present medications. Await pathology results.   YOU HAD AN ENDOSCOPIC PROCEDURE TODAY AT THE [facility]:   Refer to the procedure report that was given to you for any specific questions about what was found during the examination.  If the procedure report does not answer your questions, please call your gastroenterologist to clarify.  If you requested that your care partner not be given the details of your procedure findings, then the procedure report has been included in a sealed envelope for you to review at your convenience later.  YOU SHOULD EXPECT: Some feelings of bloating in the abdomen. Passage of more gas than usual.  Walking can help get rid of the air that was put into your GI tract during the procedure and reduce the bloating. If you had a lower endoscopy (such as a colonoscopy or flexible sigmoidoscopy) you may notice spotting of blood in your stool or on the toilet paper. If you underwent a bowel prep for your procedure, you may not have a normal bowel movement for a few days.  Please Note:  You might notice some irritation and congestion in your nose or some drainage.  This is from the oxygen used during your procedure.  There is no need for concern and it should clear up in a day or so.  SYMPTOMS TO REPORT IMMEDIATELY:  Following lower endoscopy (colonoscopy or flexible sigmoidoscopy):  Excessive amounts of blood in the stool  Significant tenderness or worsening of abdominal pains  Swelling of the abdomen that is new, acute  Fever of 100F or higher   For urgent or emergent issues, a gastroenterologist can be reached at any hour by calling [phone]. Do not use MyChart messaging for urgent concerns.    DIET:  We do recommend a small meal at first, but then you may proceed to your regular diet.  Drink plenty of fluids but you should avoid alcoholic beverages for 24 hours.  ACTIVITY:  You should plan to take it easy  for the rest of today and you should NOT DRIVE or use heavy machinery until tomorrow (because of the sedation medicines used during the test).    FOLLOW UP: Our staff will call the number listed on your records 48-72 hours following your procedure to check on you and address any questions or concerns that you may have regarding the information given to you following your procedure. If we do not reach you, we will leave a message.  We will attempt to reach you two times.  During this call, we will ask if you have developed any symptoms of COVID 19. If you develop any symptoms (ie: fever, flu-like symptoms, shortness of breath, cough etc.) before then, please call [phone].  If you test positive for Covid 19 in the 2 weeks post procedure, please call and report this information to us.    If any biopsies were taken you will be contacted by phone or by letter within the next 1-3 weeks.  Please call us at [phone] if you have not heard about the biopsies in 3 weeks.    SIGNATURES/CONFIDENTIALITY: You and/or your care partner have signed paperwork which will be entered into your electronic medical record.  These signatures attest to the fact that that the information above on your After Visit Summary has been reviewed and is understood.  Full responsibility of the confidentiality of this discharge information lies with you and/or your care-partner.  

## 2020-07-24 NOTE — Op Note (Signed)
[facility] Patient Name: Cody Webster Procedure Date: 07/24/2020 9:39 AM MRN: [id number] Endoscopist: Milus Banister , MD Age: 55 Referring MD:  Date of Birth: 1965/08/07 Gender: Male Account #: [id number] Procedure:                Colonoscopy Indications:              Screening for colorectal malignant neoplasm Medicines:                Monitored Anesthesia Care Procedure:                Pre-Anesthesia Assessment:                           - Prior to the procedure, a History and Physical                            was performed, and patient medications and                            allergies were reviewed. The patient's tolerance of                            previous anesthesia was also reviewed. The risks                            and benefits of the procedure and the sedation                            options and risks were discussed with the patient.                            All questions were answered, and informed consent                            was obtained. Prior Anticoagulants: The patient has                            taken no previous anticoagulant or antiplatelet                            agents. ASA Grade Assessment: III - A patient with                            severe systemic disease. After reviewing the risks                            and benefits, the patient was deemed in                            satisfactory condition to undergo the procedure.                           After obtaining informed consent, the colonoscope  was passed under direct vision. Throughout the                            procedure, the patient's blood pressure, pulse, and                            oxygen saturations were monitored continuously. The                            Olympus CF-[id number]) Colonoscope was                            introduced through the anus and advanced to the the                            cecum, identified  by appendiceal orifice and                            ileocecal valve. The colonoscopy was performed                            without difficulty. The patient tolerated the                            procedure well. The quality of the bowel                            preparation was good. The ileocecal valve,                            appendiceal orifice, and rectum were photographed. Scope In: 9:50:10 AM Scope Out: 10:01:34 AM Scope Withdrawal Time: 0 hours 6 minutes 36 seconds  Total Procedure Duration: 0 hours 11 minutes 24 seconds  Findings:                 A 10 mm polyp was found in the transverse colon.                            The polyp was heaped up, removed with                            snare/cautery. Resection and retrieval were                            complete.                           The exam was otherwise without abnormality on                            direct and retroflexion views. Complications:            No immediate complications. Estimated blood loss:  None. Estimated Blood Loss:     Estimated blood loss: none. Impression:               - One 10 mm polyp in the transverse colon, removed                            with snare/cautery. Resected and retrieved.                           - The examination was otherwise normal on direct                            and retroflexion views. Recommendation:           - Patient has a contact number available for                            emergencies. The signs and symptoms of potential                            delayed complications were discussed with the                            patient. Return to normal activities tomorrow.                            Written discharge instructions were provided to the                            patient.                           - Resume previous diet.                           - Continue present medications.                           - Await  pathology results. Milus Banister, MD 07/24/2020 10:03:36 AM This report has been signed electronically.

## 2020-07-24 NOTE — Progress Notes (Signed)
A and O x3. Report to RN. Tolerated MAC anesthesia well.

## 2020-07-24 NOTE — Progress Notes (Signed)
Called to room to assist during endoscopic procedure.  Patient ID and intended procedure confirmed with present staff. Received instructions for my participation in the procedure from the performing physician.  

## 2020-07-28 NOTE — Telephone Encounter (Signed)
Patient needs letter stating he needs a handicap accessible apartment. States he has neuropathy and is a fall risk. He really needs letter completed by today.

## 2020-07-29 ENCOUNTER — Telehealth: Payer: Self-pay | Admitting: *Deleted

## 2020-07-29 ENCOUNTER — Encounter (INDEPENDENT_AMBULATORY_CARE_PROVIDER_SITE_OTHER): Payer: Self-pay | Admitting: Primary Care

## 2020-07-29 ENCOUNTER — Telehealth: Payer: Self-pay

## 2020-07-29 NOTE — Telephone Encounter (Signed)
No voicemail set up 

## 2020-07-29 NOTE — Telephone Encounter (Signed)
  Follow up Call-  Call back number 07/24/2020  Post procedure Call Back phone  # [phone]  Permission to leave phone message Yes  Some recent data might be hidden     Patient questions:  Do you have a fever, pain , or abdominal swelling? No. Pain Score  0 *  Have you tolerated food without any problems? Yes.    Have you been able to return to your normal activities? Yes.    Do you have any questions about your discharge instructions: Diet   No. Medications  No. Follow up visit  No.  Do you have questions or concerns about your Care? No.  Actions: * If pain score is 4 or above: No action needed, pain <4.   1. Have you developed a fever since your procedure? no  2.   Have you had an respiratory symptoms (SOB or cough) since your procedure? no  3.   Have you tested positive for COVID 19 since your procedure no  4.   Have you had any family members/close contacts diagnosed with the COVID 19 since your procedure?  no   If yes to any of these questions please route to Joylene John, RN and Joella Prince, RN

## 2020-08-03 ENCOUNTER — Encounter: Payer: Self-pay | Admitting: Gastroenterology

## 2020-08-03 DIAGNOSIS — H44521 Atrophy of globe, right eye: Secondary | ICD-10-CM | POA: Diagnosis not present

## 2020-08-03 DIAGNOSIS — H2512 Age-related nuclear cataract, left eye: Secondary | ICD-10-CM | POA: Diagnosis not present

## 2020-08-03 NOTE — Telephone Encounter (Signed)
Chart entered in error. SM

## 2020-08-04 ENCOUNTER — Telehealth: Payer: Self-pay | Admitting: Cardiology

## 2020-08-04 NOTE — Telephone Encounter (Signed)
Patient is returning call to discuss 07/17/20 echo results.

## 2020-08-04 NOTE — Telephone Encounter (Signed)
Pt updated with ECHO results and verbalized understanding.  

## 2020-08-05 DIAGNOSIS — Z79891 Long term (current) use of opiate analgesic: Secondary | ICD-10-CM | POA: Diagnosis not present

## 2020-08-05 DIAGNOSIS — M792 Neuralgia and neuritis, unspecified: Secondary | ICD-10-CM | POA: Diagnosis not present

## 2020-08-13 ENCOUNTER — Ambulatory Visit: Payer: Medicaid Other | Admitting: Orthopedic Surgery

## 2020-08-25 ENCOUNTER — Other Ambulatory Visit: Payer: Self-pay

## 2020-08-25 ENCOUNTER — Ambulatory Visit (INDEPENDENT_AMBULATORY_CARE_PROVIDER_SITE_OTHER): Payer: Medicaid Other | Admitting: Primary Care

## 2020-08-25 ENCOUNTER — Encounter (INDEPENDENT_AMBULATORY_CARE_PROVIDER_SITE_OTHER): Payer: Self-pay | Admitting: Primary Care

## 2020-08-25 VITALS — BP 189/99 | HR 83 | Temp 97.3°F | Resp 16 | Wt 197.0 lb

## 2020-08-25 DIAGNOSIS — I10 Essential (primary) hypertension: Secondary | ICD-10-CM | POA: Diagnosis not present

## 2020-08-25 DIAGNOSIS — F5101 Primary insomnia: Secondary | ICD-10-CM | POA: Diagnosis not present

## 2020-08-25 DIAGNOSIS — J329 Chronic sinusitis, unspecified: Secondary | ICD-10-CM | POA: Diagnosis not present

## 2020-08-25 MED ORDER — SULFAMETHOXAZOLE-TRIMETHOPRIM 800-160 MG PO TABS: 1.0000 | ORAL_TABLET | Freq: Two times a day (BID) | ORAL | 0 refills | Status: DC

## 2020-08-25 MED ORDER — FLUTICASONE PROPIONATE 50 MCG/ACT NA SUSP: 2.0000 | Freq: Every day | NASAL | 6 refills | Status: AC

## 2020-08-25 MED ORDER — CETIRIZINE HCL 10 MG PO TABS: 10.0000 mg | ORAL_TABLET | Freq: Every day | ORAL | 6 refills | Status: DC

## 2020-08-25 MED ORDER — TRAZODONE HCL 100 MG PO TABS: 100.0000 mg | ORAL_TABLET | Freq: Every evening | ORAL | 1 refills | Status: DC | PRN

## 2020-08-25 NOTE — Progress Notes (Signed)
Demetrios Loll   Established Patient Office Visit  Subjective:  Patient ID: Cody Webster, male    DOB: [birth year]  Age: 55 y.o. MRN: [id number]  CC: sinus pain   HPI Tanvir Hipple presents for sinus pain and rhinitis. The patient reports chronic sinus infections for 4 weeks.  His symptoms include nasal congestion, purulent rhinorrhea, sniffing, snoring.  There has not been a history of nasal congestion, clear rhinorrhea, sneezing, sniffing. There has not been a history of chronic otitis media or pharyngotonsillitis.  Prior antibiotic therapy has included no recent courses. Other medications have included Claritin.  He has not had allergy testing     Past Medical History:  Diagnosis Date  . Anemia   . Carotid artery occlusion   . 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 Depression   . Diabetes mellitus without complication ([place])    type 2  . GERD (gastroesophageal reflux disease)   . Hypertension   . 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 Peripheral neuropathy   . Pneumonia   . PTSD (post-traumatic stress disorder)   . Seizures ([place])    27 years ago    Past Surgical History:  Procedure Laterality Date  . AV FISTULA PLACEMENT Left 11/08/2016   Procedure: ARTERIOVENOUS (AV) FISTULA CREATION;  Surgeon: Angelia Mould, MD;  Location: [place];  Service: Vascular;  Laterality: Left;  . AV FISTULA PLACEMENT Left 03/23/2020   Procedure: LEFT BASILIC VEIN FISTULA CREATION;  Surgeon: Angelia Mould, MD;  Location: [place];  Service: Vascular;  Laterality: Left;  . NM MYOVIEW LTD  07/2017   "Moderate sized moderate severity" (on cardiology was very small size, small intensity) partially reversible inferoapical//inferoseptal defect.  (Read as intermediate-high risk) -> over read by cardiology as LOW RISK  . [place]  09/2019   [facility] ([place]) Small  sized, mild severity reversible defect involving apical lateral & mid anterolateral wall thought to be artifacts - but CRO mild ischemia.  EF 55%. => Read as LOW RISK:  . right foot surgery    . TOE AMPUTATION    . TRANSTHORACIC ECHOCARDIOGRAM  07/2017   EF 60%.,  Moderate LVH.  GR 1 DD.  No R WMA.  Mild RV and moderate RA dilation.  . TRANSTHORACIC ECHOCARDIOGRAM  10/03/2019   [facility]: EF 40%, mild MR, mild aortic calcification    Family History  Problem Relation Age of Onset  . CAD Mother   . Heart attack Mother   . Diabetes type II Father   . Stroke Father   . CAD Father   . Hypertension Sister   . CAD Sister   . Diabetes type II Brother   . Colon cancer Maternal Uncle        2015  . Colon polyps Neg Hx   . Esophageal cancer Neg Hx   . Stomach cancer Neg Hx   . Rectal cancer Neg Hx     Social History   Socioeconomic History  . Marital status: Single    Spouse name: Not on file  . Number of children: 1  . Years of education: Not on file  . Highest education level: Not on file  Occupational History    Comment: Disabled - for Back pain & foot ulcer  Tobacco Use  . Smoking status: Current Every Day Smoker  Packs/day: 0.50    Years: 30.00    Pack years: 15.00    Types: Cigarettes  . Smokeless tobacco: Former Systems developer    Types: Snuff, Sarina Ser    Quit date: 12/11/1983  Vaping Use  . Vaping Use: Never used  Substance and Sexual Activity  . Alcohol use: Not Currently    Comment: occasionally  . Drug use: No  . Sexual activity: Not Currently  Other Topics Concern  . Not on file  Social History Narrative  . Not on file   Social Determinants of Health   Financial Resource Strain: Not on file  Food Insecurity: Not on file  Transportation Needs: Not on file  Physical Activity: Not on file  Stress: Not on file  Social Connections: Not on file  Intimate Partner Violence: Not on file    Outpatient Medications Prior to Visit  Medication Sig Dispense Refill  .  acetaminophen (TYLENOL) 500 MG tablet Take 1,000 mg by mouth every 6 (six) hours as needed (for pain/headaches.).    Marland Kitchen aspirin EC 81 MG tablet Take 81 mg by mouth every evening.    . cloNIDine (CATAPRES) 0.1 MG tablet Take 1 tablet (0.1 mg total) by mouth 3 (three) times daily. (Patient taking differently: Take 0.1 mg by mouth 3 (three) times daily as needed.) 270 tablet 3  . gabapentin (NEURONTIN) 300 MG capsule Take 1 capsule (300 mg total) by mouth 2 (two) times daily. 180 capsule 1  . hydrALAZINE (APRESOLINE) 50 MG tablet TAKE 2 TABLETS (100 MG TOTAL) BY MOUTH 3 (THREE) TIMES DAILY. (Patient taking differently: Take 100 mg by mouth 3 (three) times daily as needed.) 180 tablet 2  . nitroGLYCERIN (NITROSTAT) 0.4 MG SL tablet Place 1 tablet (0.4 mg total) under the tongue every 5 (five) minutes as needed for chest pain (CP or SOB). 30 tablet 0  . oxyCODONE-acetaminophen (PERCOCET) 10-325 MG tablet Take 1 tablet by mouth every 6 (six) hours as needed. For pain. 8 tablet 0  . cetirizine (ZYRTEC) 10 MG tablet Take 1 tablet (10 mg total) by mouth daily. (Patient not taking: Reported on 08/25/2020) 90 tablet 6  . fluticasone (FLONASE) 50 MCG/ACT nasal spray Place 2 sprays into both nostrils daily. (Patient not taking: Reported on 08/25/2020) 16 g 6  . traZODone (DESYREL) 100 MG tablet Take 1 tablet (100 mg total) by mouth at bedtime as needed for sleep. Take 1/2 to 1 tablet at bedtime for insomnia (Patient not taking: Reported on 08/25/2020) 90 tablet 1   No facility-administered medications prior to visit.    Allergies  Allergen Reactions  . Morphine And Related Shortness Of Breath and Other (See Comments)    Hallucination  Tolerates Norco/Vicodin    ROS Review of Systems  HENT: Positive for congestion, sinus pressure and sinus pain.   Eyes: Positive for itching.  Psychiatric/Behavioral: Positive for sleep disturbance.  All other systems reviewed and are negative.     Objective:    BP (!)  189/99   Pulse 83   Temp (!) 97.3 F (36.3 C)   Resp 16   Wt 197 lb (89.4 kg)   SpO2 99%   BMI 25.99 kg/m  Physical Exam Vitals reviewed.  Constitutional:      Appearance: Normal appearance.  HENT:     Head:     Comments: Rhinitis, throat red, frontal and maxillary tenderness    Right Ear: External ear normal.     Left Ear: External ear normal.     Nose:  Nose normal.     Mouth/Throat:     Mouth: Mucous membranes are moist.  Eyes:     Extraocular Movements: Extraocular movements intact.  Cardiovascular:     Rate and Rhythm: Normal rate and regular rhythm.  Pulmonary:     Effort: Pulmonary effort is normal.     Breath sounds: Normal breath sounds.  Abdominal:     General: Bowel sounds are normal. There is distension.     Palpations: Abdomen is soft.  Musculoskeletal:        General: Normal range of motion.     Cervical back: Normal range of motion.  Skin:    General: Skin is warm and dry.  Neurological:     Mental Status: He is alert and oriented to person, place, and time.  Psychiatric:        Mood and Affect: Mood normal.        Behavior: Behavior normal.        Thought Content: Thought content normal.        Judgment: Judgment normal.     BP (!) 189/99   Pulse 83   Temp (!) 97.3 F (36.3 C)   Resp 16   Wt 197 lb (89.4 kg)   SpO2 99%   BMI 25.99 kg/m  Wt Readings from Last 3 Encounters:  08/25/20 197 lb (89.4 kg)  07/24/20 207 lb (93.9 kg)  07/13/20 207 lb (93.9 kg)     Health Maintenance Due  Topic Date Due  . Hepatitis C Screening  Never done  . PNEUMOCOCCAL POLYSACCHARIDE VACCINE AGE 39-64 HIGH RISK  Never done  . COVID-19 Vaccine (1) Never done  . OPHTHALMOLOGY EXAM  Never done  . HEMOGLOBIN A1C  03/27/2020    There are no preventive care reminders to display for this patient.  Lab Results  Component Value Date   TSH 1.016 07/12/2014   Lab Results  Component Value Date   WBC 4.1 05/27/2020   HGB 8.9 (L) 05/27/2020   HCT 26.4 (L)  05/27/2020   MCV 93 05/27/2020   PLT 185 05/27/2020   Lab Results  Component Value Date   NA 138 04/23/2020   K 5.2 04/23/2020   CO2 16 (L) 04/23/2020   GLUCOSE 84 04/23/2020   BUN 49 (H) 04/23/2020   CREATININE 7.32 (H) 04/23/2020   BILITOT 0.4 04/23/2020   ALKPHOS 313 (H) 04/23/2020   AST 13 04/23/2020   ALT 15 04/23/2020   PROT 6.7 04/23/2020   ALBUMIN 3.6 (L) 04/23/2020   CALCIUM 8.4 (L) 04/23/2020   ANIONGAP 10 10/03/2019   Lab Results  Component Value Date   CHOL 192 04/23/2020   Lab Results  Component Value Date   HDL 50 04/23/2020   Lab Results  Component Value Date   LDLCALC 128 (H) 04/23/2020   Lab Results  Component Value Date   TRIG 74 04/23/2020   Lab Results  Component Value Date   CHOLHDL 3.8 04/23/2020   Lab Results  Component Value Date   HGBA1C 4.7 09/25/2019      Assessment & Plan:   Diagnoses and all orders for this visit:  Chronic sinusitis, unspecified location With signs and symptoms on exam will treat for sinus infection and encouraged to take antihistamine during this high pollen time  Primary insomnia Secondary to posttraumatic stress disorder -     traZODone (DESYREL) 100 MG tablet; Take 1 tablet (100 mg total) by mouth at bedtime as needed for sleep. Take 1/2 to  1 tablet at bedtime for insomnia  Essential hypertension Blood pressure is elevated 189/99 he admits to not taking his blood pressure medication prior to appointment.  Goal remains 130/80.  Also, a contributing factor to elevated blood pressure is chronic kidney disease stage IV-V.  Review of previous blood pressure-no change in therapy  Other orders -     fluticasone (FLONASE) 50 MCG/ACT nasal spray; Place 2 sprays into both nostrils daily. -     cetirizine (ZYRTEC) 10 MG tablet; Take 1 tablet (10 mg total) by mouth daily. -     sulfamethoxazole-trimethoprim (BACTRIM DS) 800-160 MG tablet; Take 1 tablet by mouth 2 (two) times daily.   Follow-up: Return in about 2  months (around 10/25/2020) for Blood pressure follow-up on medication.    Kerin Perna, NP

## 2020-08-25 NOTE — Progress Notes (Signed)
Back pain  Sinus medication Needs fluid pills, retaining fluid in legs  RF on meds:  Trazodone

## 2020-08-27 DIAGNOSIS — M792 Neuralgia and neuritis, unspecified: Secondary | ICD-10-CM | POA: Diagnosis not present

## 2020-08-27 DIAGNOSIS — G629 Polyneuropathy, unspecified: Secondary | ICD-10-CM | POA: Diagnosis not present

## 2020-08-27 DIAGNOSIS — Z79891 Long term (current) use of opiate analgesic: Secondary | ICD-10-CM | POA: Diagnosis not present

## 2020-09-10 DIAGNOSIS — I110 Hypertensive heart disease with heart failure: Secondary | ICD-10-CM | POA: Diagnosis not present

## 2020-09-11 DIAGNOSIS — I110 Hypertensive heart disease with heart failure: Secondary | ICD-10-CM | POA: Diagnosis not present

## 2020-09-16 DIAGNOSIS — N2581 Secondary hyperparathyroidism of renal origin: Secondary | ICD-10-CM | POA: Diagnosis not present

## 2020-09-16 DIAGNOSIS — N189 Chronic kidney disease, unspecified: Secondary | ICD-10-CM | POA: Diagnosis not present

## 2020-09-16 DIAGNOSIS — N185 Chronic kidney disease, stage 5: Secondary | ICD-10-CM | POA: Diagnosis not present

## 2020-09-17 ENCOUNTER — Ambulatory Visit: Payer: Medicaid Other | Admitting: Cardiology

## 2020-09-21 DIAGNOSIS — I110 Hypertensive heart disease with heart failure: Secondary | ICD-10-CM | POA: Diagnosis not present

## 2020-09-22 DIAGNOSIS — I110 Hypertensive heart disease with heart failure: Secondary | ICD-10-CM | POA: Diagnosis not present

## 2020-09-23 DIAGNOSIS — I110 Hypertensive heart disease with heart failure: Secondary | ICD-10-CM | POA: Diagnosis not present

## 2020-09-24 DIAGNOSIS — I110 Hypertensive heart disease with heart failure: Secondary | ICD-10-CM | POA: Diagnosis not present

## 2020-09-25 DIAGNOSIS — I110 Hypertensive heart disease with heart failure: Secondary | ICD-10-CM | POA: Diagnosis not present

## 2020-09-28 DIAGNOSIS — I110 Hypertensive heart disease with heart failure: Secondary | ICD-10-CM | POA: Diagnosis not present

## 2020-09-29 DIAGNOSIS — I110 Hypertensive heart disease with heart failure: Secondary | ICD-10-CM | POA: Diagnosis not present

## 2020-09-30 ENCOUNTER — Other Ambulatory Visit (INDEPENDENT_AMBULATORY_CARE_PROVIDER_SITE_OTHER): Payer: Self-pay | Admitting: Primary Care

## 2020-09-30 DIAGNOSIS — I110 Hypertensive heart disease with heart failure: Secondary | ICD-10-CM | POA: Diagnosis not present

## 2020-09-30 NOTE — Telephone Encounter (Signed)
  Notes to clinic:  medication filled by a different provider  Review for refill    Requested Prescriptions  Pending Prescriptions Disp Refills   cloNIDine (CATAPRES) 0.1 MG tablet [Pharmacy Med Name: CLONIDINE HCL 0.1 MG ORAL TABLET] 90 tablet     Sig: TAKE 1 TABLET (0.1 MG) BY ORAL ROUTE 3 TIMES PER DAY      Cardiovascular:  Alpha-2 Agonists Failed - 09/30/2020  1:33 PM      Failed - Last BP in normal range    BP Readings from Last 1 Encounters:  08/25/20 (!) 189/99          Passed - Last Heart Rate in normal range    Pulse Readings from Last 1 Encounters:  08/25/20 83          Passed - Valid encounter within last 6 months    Recent Outpatient Visits           1 month ago Chronic sinusitis, unspecified location   [place], Michelle P, NP   4 months ago CKD (chronic kidney disease), stage IV ([place])   [place] [facility] Kerin Perna, NP   5 months ago Essential hypertension   [place], Michelle P, NP   7 months ago At maximum risk for fall   [place] Juluis Mire P, NP   11 months ago Type 2 diabetes mellitus without complication, without long-term current use of insulin ([place])   [place] Kerin Perna, NP

## 2020-10-01 ENCOUNTER — Ambulatory Visit (INDEPENDENT_AMBULATORY_CARE_PROVIDER_SITE_OTHER): Payer: Self-pay | Admitting: *Deleted

## 2020-10-01 ENCOUNTER — Other Ambulatory Visit (INDEPENDENT_AMBULATORY_CARE_PROVIDER_SITE_OTHER): Payer: Self-pay | Admitting: Primary Care

## 2020-10-01 ENCOUNTER — Telehealth: Payer: Self-pay

## 2020-10-01 DIAGNOSIS — I110 Hypertensive heart disease with heart failure: Secondary | ICD-10-CM | POA: Diagnosis not present

## 2020-10-01 MED ORDER — GUAIFENESIN ER 600 MG PO TB12: 600.0000 mg | ORAL_TABLET | Freq: Two times a day (BID) | ORAL | 1 refills | Status: DC | PRN

## 2020-10-01 NOTE — Telephone Encounter (Signed)
Sent to PCP ?

## 2020-10-01 NOTE — Telephone Encounter (Signed)
Copied from [place] [id number]. Topic: General - Other >> Oct 01, 2020  4:10 PM Tessa Lerner A wrote: Reason for CRM: Patient would like to be prescribed a medication to help with congestion  Patient has been experiencing congestion for roughly 4-5 days and has seen little to no relief from OTC medications  Please contact to further advise if needed

## 2020-10-01 NOTE — Telephone Encounter (Signed)
Patient called to report symptoms of congestion x 5 days ago and OTC medications are not helping. Dry cough and now productive with yellowish green sputum. Denies chest pain or pressure, difficulty breathing, fever or runny nose. Denies bodyaches, no N/V. Reports sinus headache, poor appetite and no energy. Patient has not been tested for covid and reports he has not been around anyone with covid. Does have a hx pnm and CHF. Patient requesting if he can get any medication to help with symptoms. mucinex is not helping with symptoms. appt scheduled virtual visit for 10/28/20. Patient requesting earlier virtual visit. Please advise. Care advise given. Patient verbalized understanding of care advise and to call back or go to [facility] or ED if symptoms worsen.   Reason for Disposition . Cough  Answer Assessment - Initial Assessment Questions 1. ONSET: "When did the cough begin?"      5 days ago  2. SEVERITY: "How bad is the cough today?"      Getting worse and now productive cough 3. SPUTUM: "Describe the color of your sputum" (none, dry cough; clear, white, yellow, green)     Yellowish green  4. HEMOPTYSIS: "Are you coughing up any blood?" If so ask: "How much?" (flecks, streaks, tablespoons, etc.)     No  5. DIFFICULTY BREATHING: "Are you having difficulty breathing?" If Yes, ask: "How bad is it?" (e.g., mild, moderate, severe)    - MILD: No SOB at rest, mild SOB with walking, speaks normally in sentences, can lie down, no retractions, pulse < 100.    - MODERATE: SOB at rest, SOB with minimal exertion and prefers to sit, cannot lie down flat, speaks in phrases, mild retractions, audible wheezing, pulse 100-120.    - SEVERE: Very SOB at rest, speaks in single words, struggling to breathe, sitting hunched forward, retractions, pulse > 120      No  6. FEVER: "Do you have a fever?" If Yes, ask: "What is your temperature, how was it measured, and when did it start?"     No  7. CARDIAC HISTORY: "Do you have any  history of heart disease?" (e.g., heart attack, congestive heart failure)      CHF 8. LUNG HISTORY: "Do you have any history of lung disease?"  (e.g., pulmonary embolus, asthma, emphysema)     Hx pneumonia  9. PE RISK FACTORS: "Do you have a history of blood clots?" (or: recent major surgery, recent prolonged travel, bedridden)     no 10. OTHER SYMPTOMS: "Do you have any other symptoms?" (e.g., runny nose, wheezing, chest pain)       Congestion  11. PREGNANCY: "Is there any chance you are pregnant?" "When was your last menstrual period?"       na 12. TRAVEL: "Have you traveled out of the country in the last month?" (e.g., travel history, exposures)       na  Protocols used: [place]

## 2020-10-01 NOTE — Telephone Encounter (Signed)
Call patient has had a call for approximately a week nonproductive feels like congestion and unable to cough it up.  Sent in expectorant guaifenesin twice daily as needed.  Note also has an appointment tomorrow with Dr.Goldsbough asked him to also let her know. From the phone call to tell feeling a little down started back smoking half a pack a day reminded him about kidney transplant in the risk of not being eligible candidate.  We will discuss this further at follow-up appointment in June.

## 2020-10-02 ENCOUNTER — Ambulatory Visit (HOSPITAL_COMMUNITY)
Admission: RE | Admit: 2020-10-02 | Discharge: 2020-10-02 | Disposition: A | Discharge status: Home / Self Care | Payer: Medicaid Other | Source: Ambulatory Visit | Attending: Nephrology | Admitting: Nephrology

## 2020-10-02 ENCOUNTER — Other Ambulatory Visit: Payer: Self-pay

## 2020-10-02 DIAGNOSIS — N185 Chronic kidney disease, stage 5: Secondary | ICD-10-CM | POA: Diagnosis not present

## 2020-10-02 DIAGNOSIS — I110 Hypertensive heart disease with heart failure: Secondary | ICD-10-CM | POA: Diagnosis not present

## 2020-10-02 DIAGNOSIS — N170 Acute kidney failure with tubular necrosis: Secondary | ICD-10-CM | POA: Diagnosis not present

## 2020-10-02 LAB — POCT HEMOGLOBIN-HEMACUE: Hemoglobin: 8.8 g/dL — ABNORMAL LOW (ref 13.0–17.0)

## 2020-10-02 MED ORDER — EPOETIN ALFA-EPBX 10000 UNIT/ML IJ SOLN: 20000.0000 [IU] | INTRAMUSCULAR | Status: DC

## 2020-10-02 MED ORDER — EPOETIN ALFA-EPBX 10000 UNIT/ML IJ SOLN
INTRAMUSCULAR | Status: AC
  Administered 2020-10-02: 20000 [IU] via SUBCUTANEOUS
  Filled 2020-10-02: qty 2

## 2020-10-05 DIAGNOSIS — I110 Hypertensive heart disease with heart failure: Secondary | ICD-10-CM | POA: Diagnosis not present

## 2020-10-06 DIAGNOSIS — I110 Hypertensive heart disease with heart failure: Secondary | ICD-10-CM | POA: Diagnosis not present

## 2020-10-06 NOTE — Telephone Encounter (Signed)
Please advise 

## 2020-10-07 DIAGNOSIS — I110 Hypertensive heart disease with heart failure: Secondary | ICD-10-CM | POA: Diagnosis not present

## 2020-10-08 DIAGNOSIS — I110 Hypertensive heart disease with heart failure: Secondary | ICD-10-CM | POA: Diagnosis not present

## 2020-10-09 DIAGNOSIS — I110 Hypertensive heart disease with heart failure: Secondary | ICD-10-CM | POA: Diagnosis not present

## 2020-10-13 DIAGNOSIS — I110 Hypertensive heart disease with heart failure: Secondary | ICD-10-CM | POA: Diagnosis not present

## 2020-10-14 DIAGNOSIS — I110 Hypertensive heart disease with heart failure: Secondary | ICD-10-CM | POA: Diagnosis not present

## 2020-10-15 DIAGNOSIS — I110 Hypertensive heart disease with heart failure: Secondary | ICD-10-CM | POA: Diagnosis not present

## 2020-10-16 ENCOUNTER — Inpatient Hospital Stay (HOSPITAL_COMMUNITY): Admission: RE | Admit: 2020-10-16 | Payer: Medicaid Other | Source: Ambulatory Visit

## 2020-10-16 DIAGNOSIS — I110 Hypertensive heart disease with heart failure: Secondary | ICD-10-CM | POA: Diagnosis not present

## 2020-10-19 DIAGNOSIS — I110 Hypertensive heart disease with heart failure: Secondary | ICD-10-CM | POA: Diagnosis not present

## 2020-10-20 DIAGNOSIS — I110 Hypertensive heart disease with heart failure: Secondary | ICD-10-CM | POA: Diagnosis not present

## 2020-10-21 DIAGNOSIS — I110 Hypertensive heart disease with heart failure: Secondary | ICD-10-CM | POA: Diagnosis not present

## 2020-10-22 DIAGNOSIS — I110 Hypertensive heart disease with heart failure: Secondary | ICD-10-CM | POA: Diagnosis not present

## 2020-10-23 DIAGNOSIS — I110 Hypertensive heart disease with heart failure: Secondary | ICD-10-CM | POA: Diagnosis not present

## 2020-10-23 DIAGNOSIS — Z79891 Long term (current) use of opiate analgesic: Secondary | ICD-10-CM | POA: Diagnosis not present

## 2020-10-23 DIAGNOSIS — M792 Neuralgia and neuritis, unspecified: Secondary | ICD-10-CM | POA: Diagnosis not present

## 2020-10-23 DIAGNOSIS — G629 Polyneuropathy, unspecified: Secondary | ICD-10-CM | POA: Diagnosis not present

## 2020-10-24 DIAGNOSIS — I110 Hypertensive heart disease with heart failure: Secondary | ICD-10-CM | POA: Diagnosis not present

## 2020-10-25 DIAGNOSIS — I110 Hypertensive heart disease with heart failure: Secondary | ICD-10-CM | POA: Diagnosis not present

## 2020-10-26 DIAGNOSIS — I110 Hypertensive heart disease with heart failure: Secondary | ICD-10-CM | POA: Diagnosis not present

## 2020-10-27 DIAGNOSIS — I110 Hypertensive heart disease with heart failure: Secondary | ICD-10-CM | POA: Diagnosis not present

## 2020-10-28 ENCOUNTER — Other Ambulatory Visit: Payer: Self-pay

## 2020-10-28 ENCOUNTER — Ambulatory Visit (INDEPENDENT_AMBULATORY_CARE_PROVIDER_SITE_OTHER): Payer: Medicaid Other | Admitting: Primary Care

## 2020-10-28 DIAGNOSIS — E440 Moderate protein-calorie malnutrition: Secondary | ICD-10-CM

## 2020-10-28 DIAGNOSIS — J069 Acute upper respiratory infection, unspecified: Secondary | ICD-10-CM

## 2020-10-28 DIAGNOSIS — I110 Hypertensive heart disease with heart failure: Secondary | ICD-10-CM | POA: Diagnosis not present

## 2020-10-28 DIAGNOSIS — N184 Chronic kidney disease, stage 4 (severe): Secondary | ICD-10-CM

## 2020-10-28 DIAGNOSIS — I10 Essential (primary) hypertension: Secondary | ICD-10-CM

## 2020-10-28 DIAGNOSIS — I129 Hypertensive chronic kidney disease with stage 1 through stage 4 chronic kidney disease, or unspecified chronic kidney disease: Secondary | ICD-10-CM

## 2020-10-28 NOTE — Progress Notes (Deleted)
[facility] Telephone Note  I connected with Cody Webster, on 10/28/2020 at 11:13 AM by telephone  and verified that I am speaking with the correct person using two identifiers.   Consent: I discussed the limitations, risks, security and privacy concerns of performing an evaluation and management service by telephone and the availability of in person appointments. I also discussed with the patient that there may be a patient responsible charge related to this service. The patient expressed understanding and agreed to proceed.  Location of Patient: Home  Location of Provider: [facility] [place]   Persons participating in Telemedicine visit: [place] Juluis Mire,  NP  History of Present Illness: Cody Webster is a 55 year old male who was having a visit for cold and chest congestion which is resolving.   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     aspirin EC 81 MG tablet Take 81 mg by mouth every evening.     cetirizine (ZYRTEC) 10 MG tablet Take 1 tablet (10 mg total)  by mouth daily. 90 tablet 6   cloNIDine (CATAPRES) 0.1 MG tablet TAKE 1 TABLET (0.1 MG) BY ORAL ROUTE 3 TIMES PER DAY 90 tablet 1   fluticasone (FLONASE) 50 MCG/ACT nasal spray Place 2 sprays into both nostrils daily. 16 g 6   gabapentin (NEURONTIN) 300 MG capsule Take 1 capsule (300 mg total) by mouth 2 (two) times daily. 180 capsule 1   guaiFENesin (MUCINEX) 600 MG 12 hr tablet Take 1 tablet (600 mg total) by mouth 2 (two) times daily as needed for to loosen phlegm. 60 tablet 1   hydrALAZINE (APRESOLINE) 50 MG tablet TAKE 2 TABLETS (100 MG TOTAL) BY MOUTH 3 (THREE) TIMES DAILY. (Patient taking differently: Take 100 mg by mouth 3 (three) times daily as needed.) 180 tablet 2   nitroGLYCERIN (NITROSTAT) 0.4 MG SL tablet Place 1 tablet (0.4 mg total) under the tongue every 5 (five) minutes as needed for chest pain (CP or SOB). 30 tablet 0   oxyCODONE-acetaminophen (PERCOCET) 10-325 MG tablet Take 1 tablet by mouth every 6 (six) hours as needed. For pain. 8 tablet 0   sulfamethoxazole-trimethoprim (BACTRIM DS) 800-160 MG tablet Take 1 tablet by mouth 2 (two) times daily. 14 tablet 0   traZODone (DESYREL) 100 MG tablet Take 1 tablet (100 mg total) by mouth at bedtime as needed for sleep. Take 1/2 to 1 tablet at bedtime for insomnia 90 tablet 1   No current facility-administered medications on file  prior to visit.    Observations/Objective:  Assessment and Plan:   Follow Up Instructions:    I discussed the assessment and treatment plan with the patient. The patient was provided an opportunity to ask questions and all were answered. The patient agreed with the plan and demonstrated an understanding of the instructions.   The patient was advised to call back or seek an in-person evaluation if the symptoms worsen or if the condition fails to improve as anticipated.     I provided *** minutes total of non-face-to-face time during this encounter including median intraservice time, reviewing  previous notes, investigations, ordering medications, medical decision making, coordinating care and patient verbalized understanding at the end of the visit.    This note has been created with Surveyor, quantity. Any transcriptional errors are unintentional.   Kerin Perna, NP 10/28/2020, 11:13 AM

## 2020-10-28 NOTE — Progress Notes (Signed)
[facility] Telephone Note  I connected with Cody Webster, on 10/28/2020 at 11:24 AM by telephone  and verified that I am speaking with the correct person using two identifiers.   Consent: I discussed the limitations, risks, security and privacy concerns of performing an evaluation and management service by telephone and the availability of in person appointments. I also discussed with the patient that there may be a patient responsible charge related to this service. The patient expressed understanding and agreed to proceed.  Location of Patient: Home  Location of Provider: [facility] [place]   Persons participating in Telemedicine visit: Cody Shi Juluis Mire,  NP  History of Present Illness: [name] is a 55 year old male who was having a visit for cold and chest congestion which is resolving.   Past Medical History:  Diagnosis Date  . Anemia   . Carotid artery occlusion   . 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 Depression   . Diabetes mellitus without complication ([place])    type 2  . GERD (gastroesophageal reflux disease)   . Hypertension   . 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 Peripheral neuropathy   . Pneumonia   . PTSD (post-traumatic stress disorder)   . Seizures ([place])    27 years ago   Allergies  Allergen Reactions  . Morphine And Related Shortness Of Breath and Other (See Comments)    Hallucination  Tolerates Norco/Vicodin    Current Outpatient Medications on File Prior to Visit  Medication Sig Dispense Refill  . acetaminophen (TYLENOL) 500 MG tablet Take 1,000 mg by mouth every 6 (six) hours as needed (for pain/headaches.).    Marland Kitchen aspirin EC 81 MG tablet Take 81 mg by mouth every evening.    . cetirizine (ZYRTEC) 10 MG tablet Take 1 tablet  (10 mg total) by mouth daily. 90 tablet 6  . cloNIDine (CATAPRES) 0.1 MG tablet TAKE 1 TABLET (0.1 MG) BY ORAL ROUTE 3 TIMES PER DAY 90 tablet 1  . fluticasone (FLONASE) 50 MCG/ACT nasal spray Place 2 sprays into both nostrils daily. 16 g 6  . gabapentin (NEURONTIN) 300 MG capsule Take 1 capsule (300 mg total) by mouth 2 (two) times daily. 180 capsule 1  . guaiFENesin (MUCINEX) 600 MG 12 hr tablet Take 1 tablet (600 mg total) by mouth 2 (two) times daily as needed for to loosen phlegm. 60 tablet 1  . hydrALAZINE (APRESOLINE) 50 MG tablet TAKE 2 TABLETS (100 MG TOTAL) BY MOUTH 3 (THREE) TIMES DAILY. (Patient taking differently: Take 100 mg by mouth 3 (three) times daily as needed.) 180 tablet 2  . nitroGLYCERIN (NITROSTAT) 0.4 MG SL tablet Place 1 tablet (0.4 mg total) under the tongue every 5 (five) minutes as needed for chest pain (CP or SOB). 30 tablet 0  . oxyCODONE-acetaminophen (PERCOCET) 10-325 MG tablet Take 1 tablet by mouth every 6 (six) hours as needed. For pain. 8 tablet 0  . sulfamethoxazole-trimethoprim (BACTRIM DS) 800-160 MG tablet Take 1 tablet by mouth 2 (two) times daily. 14 tablet 0  . traZODone (DESYREL) 100 MG tablet Take 1 tablet (100 mg total) by mouth at bedtime as needed for sleep. Take 1/2 to 1 tablet at bedtime for insomnia 90 tablet 1   No current facility-administered medications on file  prior to visit.    Observations/Objective: Review of Systems  HENT:  Positive for congestion.   Psychiatric/Behavioral:  The patient has insomnia.   All other systems reviewed and are negative.   Assessment and Plan: Diagnoses and all orders for this visit:  URI with cough and congestion Self limiting- prior to appt date problematic   CKD (chronic kidney disease), stage IV ([place]) Followed by nephrology previously on kidney transplant unclear where he stands now  Essential hypertension  Counseled on blood pressure goal of less than 130/80, low-sodium, DASH diet, medication  compliance, 150 minutes of moderate intensity exercise per week. Discussed medication compliance, adverse effects. Can be more difficult to manage with CKD  Malnutrition of moderate degree (HCC)  Glucerna 1.0 cal/Carb nutritional supplement   I discussed the assessment and treatment plan with the patient. The patient was provided an opportunity to ask questions and all were answered. The patient agreed with the plan and demonstrated an understanding of the instructions.   The patient was advised to call back or seek an in-person evaluation if the symptoms worsen or if the condition fails to improve as anticipated.     I provided 15 minutes total of non-face-to-face time during this encounter including median intraservice time, reviewing previous notes, investigations, ordering medications, medical decision making, coordinating care and patient verbalized understanding at the end of the visit.    This note has been created with Surveyor, quantity. Any transcriptional errors are unintentional.   Kerin Perna, NP 10/28/2020, 11:24 AM

## 2020-10-29 DIAGNOSIS — I110 Hypertensive heart disease with heart failure: Secondary | ICD-10-CM | POA: Diagnosis not present

## 2020-10-30 DIAGNOSIS — I110 Hypertensive heart disease with heart failure: Secondary | ICD-10-CM | POA: Diagnosis not present

## 2020-10-31 DIAGNOSIS — I110 Hypertensive heart disease with heart failure: Secondary | ICD-10-CM | POA: Diagnosis not present

## 2020-11-01 DIAGNOSIS — I110 Hypertensive heart disease with heart failure: Secondary | ICD-10-CM | POA: Diagnosis not present

## 2020-11-02 DIAGNOSIS — I110 Hypertensive heart disease with heart failure: Secondary | ICD-10-CM | POA: Diagnosis not present

## 2020-11-03 DIAGNOSIS — I110 Hypertensive heart disease with heart failure: Secondary | ICD-10-CM | POA: Diagnosis not present

## 2020-11-05 DIAGNOSIS — I110 Hypertensive heart disease with heart failure: Secondary | ICD-10-CM | POA: Diagnosis not present

## 2020-11-08 DIAGNOSIS — I110 Hypertensive heart disease with heart failure: Secondary | ICD-10-CM | POA: Diagnosis not present

## 2020-11-08 MED ORDER — GLUCERNA 1.0 CAL/CARBSTEADY PO LIQD: 237.0000 mL | Freq: Three times a day (TID) | ORAL | 12 refills | Status: DC

## 2020-11-09 DIAGNOSIS — I110 Hypertensive heart disease with heart failure: Secondary | ICD-10-CM | POA: Diagnosis not present

## 2020-11-11 DIAGNOSIS — I110 Hypertensive heart disease with heart failure: Secondary | ICD-10-CM | POA: Diagnosis not present

## 2020-11-12 DIAGNOSIS — I110 Hypertensive heart disease with heart failure: Secondary | ICD-10-CM | POA: Diagnosis not present

## 2020-11-13 DIAGNOSIS — I110 Hypertensive heart disease with heart failure: Secondary | ICD-10-CM | POA: Diagnosis not present

## 2020-11-14 DIAGNOSIS — I110 Hypertensive heart disease with heart failure: Secondary | ICD-10-CM | POA: Diagnosis not present

## 2020-11-17 DIAGNOSIS — I110 Hypertensive heart disease with heart failure: Secondary | ICD-10-CM | POA: Diagnosis not present

## 2020-11-18 DIAGNOSIS — I110 Hypertensive heart disease with heart failure: Secondary | ICD-10-CM | POA: Diagnosis not present

## 2020-11-19 DIAGNOSIS — I110 Hypertensive heart disease with heart failure: Secondary | ICD-10-CM | POA: Diagnosis not present

## 2020-11-20 DIAGNOSIS — I110 Hypertensive heart disease with heart failure: Secondary | ICD-10-CM | POA: Diagnosis not present

## 2020-11-21 DIAGNOSIS — I110 Hypertensive heart disease with heart failure: Secondary | ICD-10-CM | POA: Diagnosis not present

## 2020-11-22 DIAGNOSIS — I110 Hypertensive heart disease with heart failure: Secondary | ICD-10-CM | POA: Diagnosis not present

## 2020-11-23 DIAGNOSIS — I110 Hypertensive heart disease with heart failure: Secondary | ICD-10-CM | POA: Diagnosis not present

## 2020-11-24 DIAGNOSIS — I110 Hypertensive heart disease with heart failure: Secondary | ICD-10-CM | POA: Diagnosis not present

## 2020-11-25 DIAGNOSIS — I110 Hypertensive heart disease with heart failure: Secondary | ICD-10-CM | POA: Diagnosis not present

## 2020-11-26 DIAGNOSIS — I110 Hypertensive heart disease with heart failure: Secondary | ICD-10-CM | POA: Diagnosis not present

## 2020-11-27 DIAGNOSIS — I110 Hypertensive heart disease with heart failure: Secondary | ICD-10-CM | POA: Diagnosis not present

## 2020-11-28 DIAGNOSIS — I110 Hypertensive heart disease with heart failure: Secondary | ICD-10-CM | POA: Diagnosis not present

## 2020-11-29 DIAGNOSIS — I110 Hypertensive heart disease with heart failure: Secondary | ICD-10-CM | POA: Diagnosis not present

## 2020-11-30 DIAGNOSIS — I110 Hypertensive heart disease with heart failure: Secondary | ICD-10-CM | POA: Diagnosis not present

## 2020-12-01 DIAGNOSIS — I110 Hypertensive heart disease with heart failure: Secondary | ICD-10-CM | POA: Diagnosis not present

## 2020-12-02 DIAGNOSIS — I110 Hypertensive heart disease with heart failure: Secondary | ICD-10-CM | POA: Diagnosis not present

## 2020-12-03 ENCOUNTER — Encounter: Payer: Self-pay | Admitting: Orthopedic Surgery

## 2020-12-03 ENCOUNTER — Ambulatory Visit: Payer: Medicaid Other | Admitting: Physician Assistant

## 2020-12-03 DIAGNOSIS — B351 Tinea unguium: Secondary | ICD-10-CM

## 2020-12-03 DIAGNOSIS — I110 Hypertensive heart disease with heart failure: Secondary | ICD-10-CM | POA: Diagnosis not present

## 2020-12-03 NOTE — Progress Notes (Signed)
Office Visit Note   Patient: Cody Webster           Date of Birth: 18-Oct-1965           MRN: [id number] Visit Date: 12/03/2020              Requested by: Kerin Perna, NP [address] [place],  [place] [address] PCP: Kerin Perna, NP  Chief Complaint  Patient presents with   Right Foot - Nail Problem   Left Foot - Nail Problem      HPI: Patient presents today for trimming of his onychomycotic nails as well as trimming of the callus on the plantar surface of his left foot.  He has a history of diabetic neuropathy and previous toe amputations  Assessment & Plan: Visit Diagnoses: No diagnosis found.  Plan: Onychomycotic nails trimmed x9.  Also trimming of callus beneath plantars forefoot surfa  Follow-Up Instructions: No follow-ups on file.   Ortho Exam  Patient is alert, oriented, no adenopathy, well-dressed, normal affect, normal respiratory effort. Examination demonstrates foot is warm feet are warm pink without any signs of cellulitis no open areas no infection onychomycotic nails.  These were trimmed to stable safe surfaces.  Also thick callus beneath metatarsal head was trimmed to a soft surface there is no surrounding erythema no drainage no signs of infection  Imaging: No results found. No images are attached to the encounter.  Labs: Lab Results  Component Value Date   HGBA1C 4.7 09/25/2019   HGBA1C 5.1 07/20/2017   HGBA1C 6.7 (H) 04/23/2016   ESRSEDRATE 25 (H) 12/10/2013   CRP <0.5 (L) 12/10/2013   REPTSTATUS 04/30/2016 FINAL 04/24/2016   REPTSTATUS 04/30/2016 FINAL 04/24/2016   CULT  04/24/2016    NO GROWTH 5 DAYS Performed at [facility]  04/24/2016    NO GROWTH 5 DAYS Performed at [facility]      Lab Results  Component Value Date   ALBUMIN 3.6 (L) 04/23/2020   ALBUMIN 4.0 08/30/2017   ALBUMIN 3.4 (L) 07/20/2017    Lab Results  Component Value Date   MG 1.7 04/27/2016   MG 1.9 04/26/2016   MG 1.8  04/25/2016   No results found for: VD25OH  No results found for: PREALBUMIN CBC EXTENDED Latest Ref Rng & Units 10/02/2020 05/27/2020 05/06/2020  WBC 3.4 - 10.8 x10E3/uL - 4.1 -  RBC 4.14 - 5.80 x10E6/uL - 2.83(L) -  HGB 13.0 - 17.0 g/dL 8.8(L) 8.9(L) 8.2(L)  HCT 37.5 - 51.0 % - 26.4(L) -  PLT 150 - 450 x10E3/uL - 185 -  NEUTROABS 1.4 - 7.0 x10E3/uL - 2.6 -  LYMPHSABS 0.7 - 3.1 x10E3/uL - 0.9 -     There is no height or weight on file to calculate BMI.  Orders:  No orders of the defined types were placed in this encounter.  No orders of the defined types were placed in this encounter.    Procedures: No procedures performed  Clinical Data: No additional findings.  ROS:  All other systems negative, except as noted in the HPI. Review of Systems  Objective: Vital Signs: There were no vitals taken for this visit.  Specialty Comments:  No specialty comments available.  PMFS History: Patient Active Problem List   Diagnosis Date Noted   OSA (obstructive sleep apnea) 12/05/2019   Chronic foot pain, right 04/02/2018   DDD (degenerative disc disease), lumbar 04/02/2018   Abnormal liver enzymes 03/30/2018   Abnormal  cardiovascular stress test 02/08/2018   Preoperative cardiovascular examination 11/04/2016   Malnutrition of moderate degree ([place]) 01/12/2015   DM type 2, uncontrolled, with neuropathy ([place]) 01/11/2015   Glaucoma    Eye pain    Acute glaucoma of right eye 09/06/2014   Acute renal failure superimposed on chronic kidney disease ([place]) 09/06/2014   Anemia 09/06/2014   Tobacco use disorder 09/06/2014   Frequent headaches 07/11/2014   CKD (chronic kidney disease), stage V (HCC) -> not yet on hemodialysis 07/11/2014   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, not on chronic dialysis ([place]) 07/11/2014   Ataxia 07/11/2014   CKD (chronic kidney disease) 12/11/2013   Diabetic foot ulcer ([place]) 12/10/2013   Essential  hypertension 12/10/2013   Peripheral neuropathy ([employer]) 12/10/2013   Pain, dental 10/08/2013   Osteomyelitis of ankle or foot, right, acute ([place]) 08/30/2013   Hyperlipidemia 10/20/2012   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   Depression    Diabetes mellitus without complication ([place])    type 2   GERD (gastroesophageal reflux disease)    Hypertension    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 ([place]) 2015   EF now improved back to normal mildly reduced   Peripheral neuropathy    Pneumonia    PTSD (post-traumatic stress disorder)    Seizures ([place])    27 years ago    Family History  Problem Relation Age of Onset   CAD Mother    Heart attack Mother    Diabetes type II Father    Stroke Father    CAD Father    Hypertension Sister    CAD Sister    Diabetes type II Brother    Colon cancer Maternal Uncle        2015   Colon polyps Neg Hx    Esophageal cancer Neg Hx    Stomach cancer Neg Hx    Rectal cancer Neg Hx     Past Surgical History:  Procedure Laterality Date   AV FISTULA PLACEMENT Left 11/08/2016   Procedure: ARTERIOVENOUS (AV) FISTULA CREATION;  Surgeon: Angelia Mould, MD;  Location: [facility];  Service: Vascular;  Laterality: Left;   AV FISTULA PLACEMENT Left 03/23/2020   Procedure: LEFT BASILIC VEIN FISTULA CREATION;  Surgeon: Angelia Mould, MD;  Location: [facility] OR;  Service: Vascular;  Laterality: Left;   NM MYOVIEW LTD  07/2017   "Moderate sized moderate severity" (on cardiology was very small size, small intensity) partially reversible inferoapical//inferoseptal defect.  (Read as intermediate-high risk) -> over read by cardiology as [place]  09/2019   [facility] Small sized, mild severity reversible defect involving apical lateral & mid anterolateral wall thought to be artifacts - but  CRO mild ischemia.  EF 55%. => Read as LOW RISK:   right foot surgery     TOE AMPUTATION     TRANSTHORACIC ECHOCARDIOGRAM  07/2017   EF 60%.,  Moderate LVH.  GR 1 DD.  No R WMA.  Mild RV and moderate RA dilation.   TRANSTHORACIC ECHOCARDIOGRAM  10/03/2019   [facility]: EF 40%, mild MR, mild aortic calcification   Social History   Occupational History    Comment: Disabled - for Back pain & foot ulcer  Tobacco  Use   Smoking status: Every Day    Packs/day: 0.50    Years: 30.00    Pack years: 15.00    Types: Cigarettes   Smokeless tobacco: Former    Types: Snuff, Chew    Quit date: 12/11/1983  Vaping Use   Vaping Use: Never used  Substance and Sexual Activity   Alcohol use: Not Currently    Comment: occasionally   Drug use: No   Sexual activity: Not Currently

## 2020-12-04 DIAGNOSIS — I110 Hypertensive heart disease with heart failure: Secondary | ICD-10-CM | POA: Diagnosis not present

## 2020-12-05 DIAGNOSIS — I110 Hypertensive heart disease with heart failure: Secondary | ICD-10-CM | POA: Diagnosis not present

## 2020-12-06 DIAGNOSIS — I110 Hypertensive heart disease with heart failure: Secondary | ICD-10-CM | POA: Diagnosis not present

## 2020-12-07 DIAGNOSIS — I110 Hypertensive heart disease with heart failure: Secondary | ICD-10-CM | POA: Diagnosis not present

## 2020-12-08 DIAGNOSIS — I110 Hypertensive heart disease with heart failure: Secondary | ICD-10-CM | POA: Diagnosis not present

## 2020-12-09 DIAGNOSIS — I110 Hypertensive heart disease with heart failure: Secondary | ICD-10-CM | POA: Diagnosis not present

## 2020-12-10 DIAGNOSIS — I110 Hypertensive heart disease with heart failure: Secondary | ICD-10-CM | POA: Diagnosis not present

## 2020-12-11 DIAGNOSIS — I110 Hypertensive heart disease with heart failure: Secondary | ICD-10-CM | POA: Diagnosis not present

## 2020-12-12 DIAGNOSIS — I110 Hypertensive heart disease with heart failure: Secondary | ICD-10-CM | POA: Diagnosis not present

## 2020-12-13 DIAGNOSIS — I110 Hypertensive heart disease with heart failure: Secondary | ICD-10-CM | POA: Diagnosis not present

## 2020-12-14 DIAGNOSIS — I110 Hypertensive heart disease with heart failure: Secondary | ICD-10-CM | POA: Diagnosis not present

## 2020-12-15 DIAGNOSIS — I110 Hypertensive heart disease with heart failure: Secondary | ICD-10-CM | POA: Diagnosis not present

## 2020-12-16 DIAGNOSIS — I110 Hypertensive heart disease with heart failure: Secondary | ICD-10-CM | POA: Diagnosis not present

## 2020-12-17 DIAGNOSIS — I110 Hypertensive heart disease with heart failure: Secondary | ICD-10-CM | POA: Diagnosis not present

## 2020-12-17 DIAGNOSIS — M792 Neuralgia and neuritis, unspecified: Secondary | ICD-10-CM | POA: Diagnosis not present

## 2020-12-17 DIAGNOSIS — Z79891 Long term (current) use of opiate analgesic: Secondary | ICD-10-CM | POA: Diagnosis not present

## 2020-12-17 DIAGNOSIS — G629 Polyneuropathy, unspecified: Secondary | ICD-10-CM | POA: Diagnosis not present

## 2020-12-18 DIAGNOSIS — I110 Hypertensive heart disease with heart failure: Secondary | ICD-10-CM | POA: Diagnosis not present

## 2020-12-19 DIAGNOSIS — I110 Hypertensive heart disease with heart failure: Secondary | ICD-10-CM | POA: Diagnosis not present

## 2020-12-20 DIAGNOSIS — I110 Hypertensive heart disease with heart failure: Secondary | ICD-10-CM | POA: Diagnosis not present

## 2020-12-21 DIAGNOSIS — I110 Hypertensive heart disease with heart failure: Secondary | ICD-10-CM | POA: Diagnosis not present

## 2020-12-22 DIAGNOSIS — I110 Hypertensive heart disease with heart failure: Secondary | ICD-10-CM | POA: Diagnosis not present

## 2020-12-23 DIAGNOSIS — I110 Hypertensive heart disease with heart failure: Secondary | ICD-10-CM | POA: Diagnosis not present

## 2020-12-24 DIAGNOSIS — I110 Hypertensive heart disease with heart failure: Secondary | ICD-10-CM | POA: Diagnosis not present

## 2020-12-25 DIAGNOSIS — I110 Hypertensive heart disease with heart failure: Secondary | ICD-10-CM | POA: Diagnosis not present

## 2020-12-26 DIAGNOSIS — I110 Hypertensive heart disease with heart failure: Secondary | ICD-10-CM | POA: Diagnosis not present

## 2020-12-27 DIAGNOSIS — I110 Hypertensive heart disease with heart failure: Secondary | ICD-10-CM | POA: Diagnosis not present

## 2020-12-28 DIAGNOSIS — I110 Hypertensive heart disease with heart failure: Secondary | ICD-10-CM | POA: Diagnosis not present

## 2020-12-29 DIAGNOSIS — I110 Hypertensive heart disease with heart failure: Secondary | ICD-10-CM | POA: Diagnosis not present

## 2020-12-30 DIAGNOSIS — I110 Hypertensive heart disease with heart failure: Secondary | ICD-10-CM | POA: Diagnosis not present

## 2020-12-31 DIAGNOSIS — I110 Hypertensive heart disease with heart failure: Secondary | ICD-10-CM | POA: Diagnosis not present

## 2021-01-01 DIAGNOSIS — I110 Hypertensive heart disease with heart failure: Secondary | ICD-10-CM | POA: Diagnosis not present

## 2021-01-02 DIAGNOSIS — I110 Hypertensive heart disease with heart failure: Secondary | ICD-10-CM | POA: Diagnosis not present

## 2021-01-03 DIAGNOSIS — I110 Hypertensive heart disease with heart failure: Secondary | ICD-10-CM | POA: Diagnosis not present

## 2021-01-04 DIAGNOSIS — I110 Hypertensive heart disease with heart failure: Secondary | ICD-10-CM | POA: Diagnosis not present

## 2021-01-05 DIAGNOSIS — I110 Hypertensive heart disease with heart failure: Secondary | ICD-10-CM | POA: Diagnosis not present

## 2021-01-06 DIAGNOSIS — I110 Hypertensive heart disease with heart failure: Secondary | ICD-10-CM | POA: Diagnosis not present

## 2021-01-07 DIAGNOSIS — I110 Hypertensive heart disease with heart failure: Secondary | ICD-10-CM | POA: Diagnosis not present

## 2021-01-08 DIAGNOSIS — I110 Hypertensive heart disease with heart failure: Secondary | ICD-10-CM | POA: Diagnosis not present

## 2021-01-10 DIAGNOSIS — I110 Hypertensive heart disease with heart failure: Secondary | ICD-10-CM | POA: Diagnosis not present

## 2021-01-11 DIAGNOSIS — I110 Hypertensive heart disease with heart failure: Secondary | ICD-10-CM | POA: Diagnosis not present

## 2021-01-12 DIAGNOSIS — I110 Hypertensive heart disease with heart failure: Secondary | ICD-10-CM | POA: Diagnosis not present

## 2021-01-13 DIAGNOSIS — I110 Hypertensive heart disease with heart failure: Secondary | ICD-10-CM | POA: Diagnosis not present

## 2021-01-14 DIAGNOSIS — I110 Hypertensive heart disease with heart failure: Secondary | ICD-10-CM | POA: Diagnosis not present

## 2021-01-15 DIAGNOSIS — I110 Hypertensive heart disease with heart failure: Secondary | ICD-10-CM | POA: Diagnosis not present

## 2021-01-16 DIAGNOSIS — I110 Hypertensive heart disease with heart failure: Secondary | ICD-10-CM | POA: Diagnosis not present

## 2021-01-17 DIAGNOSIS — I110 Hypertensive heart disease with heart failure: Secondary | ICD-10-CM | POA: Diagnosis not present

## 2021-01-18 DIAGNOSIS — I110 Hypertensive heart disease with heart failure: Secondary | ICD-10-CM | POA: Diagnosis not present

## 2021-01-19 DIAGNOSIS — I110 Hypertensive heart disease with heart failure: Secondary | ICD-10-CM | POA: Diagnosis not present

## 2021-01-21 DIAGNOSIS — I110 Hypertensive heart disease with heart failure: Secondary | ICD-10-CM | POA: Diagnosis not present

## 2021-01-22 DIAGNOSIS — I110 Hypertensive heart disease with heart failure: Secondary | ICD-10-CM | POA: Diagnosis not present

## 2021-01-23 DIAGNOSIS — I110 Hypertensive heart disease with heart failure: Secondary | ICD-10-CM | POA: Diagnosis not present

## 2021-01-24 DIAGNOSIS — I110 Hypertensive heart disease with heart failure: Secondary | ICD-10-CM | POA: Diagnosis not present

## 2021-01-25 DIAGNOSIS — I110 Hypertensive heart disease with heart failure: Secondary | ICD-10-CM | POA: Diagnosis not present

## 2021-01-26 DIAGNOSIS — I110 Hypertensive heart disease with heart failure: Secondary | ICD-10-CM | POA: Diagnosis not present

## 2021-01-27 ENCOUNTER — Other Ambulatory Visit (INDEPENDENT_AMBULATORY_CARE_PROVIDER_SITE_OTHER): Payer: Self-pay | Admitting: Primary Care

## 2021-01-27 DIAGNOSIS — I110 Hypertensive heart disease with heart failure: Secondary | ICD-10-CM | POA: Diagnosis not present

## 2021-01-27 DIAGNOSIS — F5101 Primary insomnia: Secondary | ICD-10-CM

## 2021-01-27 NOTE — Telephone Encounter (Signed)
Sent to PCP to refill if appropriate.  

## 2021-01-27 NOTE — Telephone Encounter (Signed)
Requested medication (s) are due for refill today Not until 02/24/21  Requested medication (s) are on the active medication list Yes  Future visit scheduled No.  LOV 10/28/20  Note to clinic-Called pharmacy the patient has the last refill waiting to be picked up. Any refills would be for future.Routing to clinic for consideration.   Requested Prescriptions  Pending Prescriptions Disp Refills   traZODone (DESYREL) 100 MG tablet [Pharmacy Med Name: TRAZODONE HCL 100 MG ORAL TABLET] 90 tablet 1    Sig: TAKE 1/2 TO 1 TABLET BY MOUTH AT BEDTIME AS NEEDED FOR SLEEP.     Psychiatry: Antidepressants - Serotonin Modulator Passed - 01/27/2021  9:18 AM      Passed - Valid encounter within last 6 months    Recent Outpatient Visits           3 months ago URI with cough and congestion   [facility] Kerin Perna, NP   5 months ago Chronic sinusitis, unspecified location   [place], Michelle P, NP   8 months ago CKD (chronic kidney disease), stage IV ([place])   [place] [facility] Kerin Perna, NP   9 months ago Essential hypertension   [place], Michelle P, NP   11 months ago At maximum risk for fall   [place] Kerin Perna, NP

## 2021-01-28 DIAGNOSIS — I110 Hypertensive heart disease with heart failure: Secondary | ICD-10-CM | POA: Diagnosis not present

## 2021-01-29 DIAGNOSIS — I110 Hypertensive heart disease with heart failure: Secondary | ICD-10-CM | POA: Diagnosis not present

## 2021-01-30 DIAGNOSIS — I110 Hypertensive heart disease with heart failure: Secondary | ICD-10-CM | POA: Diagnosis not present

## 2021-01-31 DIAGNOSIS — I110 Hypertensive heart disease with heart failure: Secondary | ICD-10-CM | POA: Diagnosis not present

## 2021-02-01 DIAGNOSIS — I110 Hypertensive heart disease with heart failure: Secondary | ICD-10-CM | POA: Diagnosis not present

## 2021-02-02 DIAGNOSIS — I110 Hypertensive heart disease with heart failure: Secondary | ICD-10-CM | POA: Diagnosis not present

## 2021-02-03 DIAGNOSIS — I110 Hypertensive heart disease with heart failure: Secondary | ICD-10-CM | POA: Diagnosis not present

## 2021-02-05 DIAGNOSIS — I110 Hypertensive heart disease with heart failure: Secondary | ICD-10-CM | POA: Diagnosis not present

## 2021-02-06 DIAGNOSIS — I110 Hypertensive heart disease with heart failure: Secondary | ICD-10-CM | POA: Diagnosis not present

## 2021-02-07 DIAGNOSIS — I110 Hypertensive heart disease with heart failure: Secondary | ICD-10-CM | POA: Diagnosis not present

## 2021-02-08 DIAGNOSIS — I110 Hypertensive heart disease with heart failure: Secondary | ICD-10-CM | POA: Diagnosis not present

## 2021-02-09 DIAGNOSIS — I110 Hypertensive heart disease with heart failure: Secondary | ICD-10-CM | POA: Diagnosis not present

## 2021-02-10 DIAGNOSIS — I110 Hypertensive heart disease with heart failure: Secondary | ICD-10-CM | POA: Diagnosis not present

## 2021-02-11 DIAGNOSIS — I110 Hypertensive heart disease with heart failure: Secondary | ICD-10-CM | POA: Diagnosis not present

## 2021-02-12 DIAGNOSIS — I110 Hypertensive heart disease with heart failure: Secondary | ICD-10-CM | POA: Diagnosis not present

## 2021-02-13 DIAGNOSIS — I110 Hypertensive heart disease with heart failure: Secondary | ICD-10-CM | POA: Diagnosis not present

## 2021-02-14 DIAGNOSIS — I110 Hypertensive heart disease with heart failure: Secondary | ICD-10-CM | POA: Diagnosis not present

## 2021-02-15 DIAGNOSIS — I110 Hypertensive heart disease with heart failure: Secondary | ICD-10-CM | POA: Diagnosis not present

## 2021-02-17 DIAGNOSIS — I110 Hypertensive heart disease with heart failure: Secondary | ICD-10-CM | POA: Diagnosis not present

## 2021-02-19 DIAGNOSIS — I110 Hypertensive heart disease with heart failure: Secondary | ICD-10-CM | POA: Diagnosis not present

## 2021-02-20 DIAGNOSIS — I110 Hypertensive heart disease with heart failure: Secondary | ICD-10-CM | POA: Diagnosis not present

## 2021-02-21 DIAGNOSIS — I110 Hypertensive heart disease with heart failure: Secondary | ICD-10-CM | POA: Diagnosis not present

## 2021-02-22 DIAGNOSIS — I110 Hypertensive heart disease with heart failure: Secondary | ICD-10-CM | POA: Diagnosis not present

## 2021-02-23 DIAGNOSIS — I110 Hypertensive heart disease with heart failure: Secondary | ICD-10-CM | POA: Diagnosis not present

## 2021-02-24 DIAGNOSIS — I110 Hypertensive heart disease with heart failure: Secondary | ICD-10-CM | POA: Diagnosis not present

## 2021-02-26 DIAGNOSIS — I110 Hypertensive heart disease with heart failure: Secondary | ICD-10-CM | POA: Diagnosis not present

## 2021-02-27 DIAGNOSIS — I110 Hypertensive heart disease with heart failure: Secondary | ICD-10-CM | POA: Diagnosis not present

## 2021-02-28 DIAGNOSIS — I110 Hypertensive heart disease with heart failure: Secondary | ICD-10-CM | POA: Diagnosis not present

## 2021-03-01 DIAGNOSIS — I110 Hypertensive heart disease with heart failure: Secondary | ICD-10-CM | POA: Diagnosis not present

## 2021-03-02 DIAGNOSIS — I110 Hypertensive heart disease with heart failure: Secondary | ICD-10-CM | POA: Diagnosis not present

## 2021-03-03 DIAGNOSIS — I110 Hypertensive heart disease with heart failure: Secondary | ICD-10-CM | POA: Diagnosis not present

## 2021-03-04 DIAGNOSIS — I110 Hypertensive heart disease with heart failure: Secondary | ICD-10-CM | POA: Diagnosis not present

## 2021-03-05 DIAGNOSIS — I110 Hypertensive heart disease with heart failure: Secondary | ICD-10-CM | POA: Diagnosis not present

## 2021-03-06 DIAGNOSIS — I110 Hypertensive heart disease with heart failure: Secondary | ICD-10-CM | POA: Diagnosis not present

## 2021-03-15 DIAGNOSIS — I110 Hypertensive heart disease with heart failure: Secondary | ICD-10-CM | POA: Diagnosis not present

## 2021-03-16 DIAGNOSIS — I110 Hypertensive heart disease with heart failure: Secondary | ICD-10-CM | POA: Diagnosis not present

## 2021-03-17 DIAGNOSIS — I110 Hypertensive heart disease with heart failure: Secondary | ICD-10-CM | POA: Diagnosis not present

## 2021-03-18 DIAGNOSIS — G629 Polyneuropathy, unspecified: Secondary | ICD-10-CM | POA: Diagnosis not present

## 2021-03-18 DIAGNOSIS — I110 Hypertensive heart disease with heart failure: Secondary | ICD-10-CM | POA: Diagnosis not present

## 2021-03-18 DIAGNOSIS — Z79891 Long term (current) use of opiate analgesic: Secondary | ICD-10-CM | POA: Diagnosis not present

## 2021-03-18 DIAGNOSIS — M792 Neuralgia and neuritis, unspecified: Secondary | ICD-10-CM | POA: Diagnosis not present

## 2021-03-19 DIAGNOSIS — I110 Hypertensive heart disease with heart failure: Secondary | ICD-10-CM | POA: Diagnosis not present

## 2021-03-20 DIAGNOSIS — I110 Hypertensive heart disease with heart failure: Secondary | ICD-10-CM | POA: Diagnosis not present

## 2021-03-21 DIAGNOSIS — I110 Hypertensive heart disease with heart failure: Secondary | ICD-10-CM | POA: Diagnosis not present

## 2021-03-22 DIAGNOSIS — I110 Hypertensive heart disease with heart failure: Secondary | ICD-10-CM | POA: Diagnosis not present

## 2021-03-23 DIAGNOSIS — I110 Hypertensive heart disease with heart failure: Secondary | ICD-10-CM | POA: Diagnosis not present

## 2021-03-24 DIAGNOSIS — I110 Hypertensive heart disease with heart failure: Secondary | ICD-10-CM | POA: Diagnosis not present

## 2021-03-25 DIAGNOSIS — I110 Hypertensive heart disease with heart failure: Secondary | ICD-10-CM | POA: Diagnosis not present

## 2021-03-26 DIAGNOSIS — I110 Hypertensive heart disease with heart failure: Secondary | ICD-10-CM | POA: Diagnosis not present

## 2021-03-29 DIAGNOSIS — I110 Hypertensive heart disease with heart failure: Secondary | ICD-10-CM | POA: Diagnosis not present

## 2021-03-30 DIAGNOSIS — I110 Hypertensive heart disease with heart failure: Secondary | ICD-10-CM | POA: Diagnosis not present

## 2021-03-31 DIAGNOSIS — I110 Hypertensive heart disease with heart failure: Secondary | ICD-10-CM | POA: Diagnosis not present

## 2021-04-01 DIAGNOSIS — I110 Hypertensive heart disease with heart failure: Secondary | ICD-10-CM | POA: Diagnosis not present

## 2021-04-02 DIAGNOSIS — I110 Hypertensive heart disease with heart failure: Secondary | ICD-10-CM | POA: Diagnosis not present

## 2021-04-05 DIAGNOSIS — I110 Hypertensive heart disease with heart failure: Secondary | ICD-10-CM | POA: Diagnosis not present

## 2021-04-06 ENCOUNTER — Other Ambulatory Visit (INDEPENDENT_AMBULATORY_CARE_PROVIDER_SITE_OTHER): Payer: Self-pay | Admitting: Primary Care

## 2021-04-06 DIAGNOSIS — I110 Hypertensive heart disease with heart failure: Secondary | ICD-10-CM | POA: Diagnosis not present

## 2021-04-06 NOTE — Telephone Encounter (Signed)
Sent to PCP ?

## 2021-04-07 ENCOUNTER — Other Ambulatory Visit: Payer: Self-pay

## 2021-04-07 ENCOUNTER — Other Ambulatory Visit (INDEPENDENT_AMBULATORY_CARE_PROVIDER_SITE_OTHER): Payer: Self-pay | Admitting: Primary Care

## 2021-04-07 ENCOUNTER — Other Ambulatory Visit (INDEPENDENT_AMBULATORY_CARE_PROVIDER_SITE_OTHER): Payer: Medicaid Other

## 2021-04-07 DIAGNOSIS — I10 Essential (primary) hypertension: Secondary | ICD-10-CM | POA: Diagnosis not present

## 2021-04-07 DIAGNOSIS — D508 Other iron deficiency anemias: Secondary | ICD-10-CM | POA: Diagnosis not present

## 2021-04-07 DIAGNOSIS — I110 Hypertensive heart disease with heart failure: Secondary | ICD-10-CM | POA: Diagnosis not present

## 2021-04-07 DIAGNOSIS — E119 Type 2 diabetes mellitus without complications: Secondary | ICD-10-CM

## 2021-04-08 DIAGNOSIS — I110 Hypertensive heart disease with heart failure: Secondary | ICD-10-CM | POA: Diagnosis not present

## 2021-04-08 LAB — CBC WITH DIFFERENTIAL/PLATELET
Basophils Absolute: 0.1 10*3/uL (ref 0.0–0.2)
Basos: 1 %
EOS (ABSOLUTE): 0.2 10*3/uL (ref 0.0–0.4)
Eos: 5 %
Hematocrit: 28.9 % — ABNORMAL LOW (ref 37.5–51.0)
Hemoglobin: 9.5 g/dL — ABNORMAL LOW (ref 13.0–17.7)
Immature Grans (Abs): 0 10*3/uL (ref 0.0–0.1)
Immature Granulocytes: 0 %
Lymphocytes Absolute: 0.8 10*3/uL (ref 0.7–3.1)
Lymphs: 19 %
MCH: 30.4 pg (ref 26.6–33.0)
MCHC: 32.9 g/dL (ref 31.5–35.7)
MCV: 93 fL (ref 79–97)
Monocytes Absolute: 0.4 10*3/uL (ref 0.1–0.9)
Monocytes: 9 %
Neutrophils Absolute: 2.9 10*3/uL (ref 1.4–7.0)
Neutrophils: 66 %
Platelets: 200 10*3/uL (ref 150–450)
RBC: 3.12 x10E6/uL — ABNORMAL LOW (ref 4.14–5.80)
RDW: 14.5 % (ref 11.6–15.4)
WBC: 4.4 10*3/uL (ref 3.4–10.8)

## 2021-04-08 LAB — CMP14+EGFR
ALT: 9 IU/L (ref 0–44)
AST: 7 IU/L (ref 0–40)
Albumin/Globulin Ratio: 1.3 (ref 1.2–2.2)
Albumin: 3.8 g/dL (ref 3.8–4.9)
Alkaline Phosphatase: 203 IU/L — ABNORMAL HIGH (ref 44–121)
BUN/Creatinine Ratio: 6 — ABNORMAL LOW (ref 9–20)
BUN: 51 mg/dL — ABNORMAL HIGH (ref 6–24)
Bilirubin Total: 0.3 mg/dL (ref 0.0–1.2)
CO2: 15 mmol/L — ABNORMAL LOW (ref 20–29)
Calcium: 8 mg/dL — ABNORMAL LOW (ref 8.7–10.2)
Chloride: 107 mmol/L — ABNORMAL HIGH (ref 96–106)
Creatinine, Ser: 9.12 mg/dL — ABNORMAL HIGH (ref 0.76–1.27)
Globulin, Total: 3 g/dL (ref 1.5–4.5)
Glucose: 86 mg/dL (ref 70–99)
Potassium: 5.5 mmol/L — ABNORMAL HIGH (ref 3.5–5.2)
Sodium: 139 mmol/L (ref 134–144)
Total Protein: 6.8 g/dL (ref 6.0–8.5)
eGFR: 6 mL/min/{1.73_m2} — ABNORMAL LOW (ref >59)

## 2021-04-08 LAB — LIPID PANEL
Chol/HDL Ratio: 3.7 ratio (ref 0.0–5.0)
Cholesterol, Total: 179 mg/dL (ref 100–199)
HDL: 48 mg/dL (ref >39)
LDL Chol Calc (NIH): 117 mg/dL — ABNORMAL HIGH (ref 0–99)
Triglycerides: 73 mg/dL (ref 0–149)
VLDL Cholesterol Cal: 14 mg/dL (ref 5–40)

## 2021-04-09 DIAGNOSIS — I110 Hypertensive heart disease with heart failure: Secondary | ICD-10-CM | POA: Diagnosis not present

## 2021-04-12 DIAGNOSIS — I110 Hypertensive heart disease with heart failure: Secondary | ICD-10-CM | POA: Diagnosis not present

## 2021-04-13 DIAGNOSIS — I110 Hypertensive heart disease with heart failure: Secondary | ICD-10-CM | POA: Diagnosis not present

## 2021-04-14 DIAGNOSIS — I110 Hypertensive heart disease with heart failure: Secondary | ICD-10-CM | POA: Diagnosis not present

## 2021-04-19 DIAGNOSIS — I110 Hypertensive heart disease with heart failure: Secondary | ICD-10-CM | POA: Diagnosis not present

## 2021-04-20 DIAGNOSIS — I110 Hypertensive heart disease with heart failure: Secondary | ICD-10-CM | POA: Diagnosis not present

## 2021-04-21 DIAGNOSIS — I110 Hypertensive heart disease with heart failure: Secondary | ICD-10-CM | POA: Diagnosis not present

## 2021-04-22 DIAGNOSIS — I110 Hypertensive heart disease with heart failure: Secondary | ICD-10-CM | POA: Diagnosis not present

## 2021-04-23 DIAGNOSIS — I110 Hypertensive heart disease with heart failure: Secondary | ICD-10-CM | POA: Diagnosis not present

## 2021-04-26 DIAGNOSIS — I110 Hypertensive heart disease with heart failure: Secondary | ICD-10-CM | POA: Diagnosis not present

## 2021-04-27 DIAGNOSIS — I110 Hypertensive heart disease with heart failure: Secondary | ICD-10-CM | POA: Diagnosis not present

## 2021-04-28 DIAGNOSIS — I110 Hypertensive heart disease with heart failure: Secondary | ICD-10-CM | POA: Diagnosis not present

## 2021-04-29 DIAGNOSIS — I110 Hypertensive heart disease with heart failure: Secondary | ICD-10-CM | POA: Diagnosis not present

## 2021-04-30 ENCOUNTER — Other Ambulatory Visit: Payer: Self-pay

## 2021-04-30 ENCOUNTER — Encounter (INDEPENDENT_AMBULATORY_CARE_PROVIDER_SITE_OTHER): Payer: Self-pay | Admitting: Primary Care

## 2021-04-30 ENCOUNTER — Ambulatory Visit (INDEPENDENT_AMBULATORY_CARE_PROVIDER_SITE_OTHER): Payer: Medicaid Other | Admitting: Primary Care

## 2021-04-30 VITALS — BP 192/84 | HR 68 | Temp 97.5°F | Ht 73.0 in | Wt 189.4 lb

## 2021-04-30 DIAGNOSIS — N185 Chronic kidney disease, stage 5: Secondary | ICD-10-CM | POA: Diagnosis not present

## 2021-04-30 DIAGNOSIS — Z131 Encounter for screening for diabetes mellitus: Secondary | ICD-10-CM | POA: Diagnosis not present

## 2021-04-30 DIAGNOSIS — I5030 Unspecified diastolic (congestive) heart failure: Secondary | ICD-10-CM

## 2021-04-30 DIAGNOSIS — I132 Hypertensive heart and chronic kidney disease with heart failure and with stage 5 chronic kidney disease, or end stage renal disease: Secondary | ICD-10-CM

## 2021-04-30 DIAGNOSIS — R21 Rash and other nonspecific skin eruption: Secondary | ICD-10-CM | POA: Diagnosis not present

## 2021-04-30 DIAGNOSIS — I110 Hypertensive heart disease with heart failure: Secondary | ICD-10-CM | POA: Diagnosis not present

## 2021-04-30 LAB — POCT GLYCOSYLATED HEMOGLOBIN (HGB A1C): Hemoglobin A1C: 4.7 % (ref 4.0–5.6)

## 2021-04-30 MED ORDER — HYDROXYZINE HCL 10 MG PO TABS: 10.0000 mg | ORAL_TABLET | Freq: Three times a day (TID) | ORAL | 0 refills | Status: DC | PRN

## 2021-04-30 MED ORDER — CLONIDINE HCL 0.1 MG PO TABS: ORAL_TABLET | ORAL | 1 refills | Status: DC

## 2021-04-30 NOTE — Progress Notes (Signed)
OUT OF cLONIDINE

## 2021-04-30 NOTE — Progress Notes (Signed)
HPI [name] is a 55 y.o.male presents for skin rash that is pruritus using triamcinolone from the [place] that is not helping.     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     aspirin EC 81 MG tablet Take 81 mg by mouth every evening.     cetirizine (ZYRTEC) 10 MG tablet Take 1 tablet (10 mg total) by mouth daily. 90 tablet 6   fluticasone (FLONASE) 50 MCG/ACT nasal spray Place 2 sprays into both nostrils daily. 16 g 6   gabapentin (NEURONTIN) 300 MG capsule Take 1 capsule (300 mg total) by mouth 2 (two) times daily. 180 capsule 1   guaiFENesin (MUCINEX) 600 MG 12 hr tablet Take 1 tablet (600 mg total) by mouth 2 (two) times daily as needed for to loosen phlegm. 60 tablet 1   hydrALAZINE (APRESOLINE) 50 MG tablet TAKE 2 TABLETS (100 MG TOTAL) BY MOUTH 3 (THREE) TIMES DAILY. (Patient taking differently: Take 100 mg by mouth 3 (three) times daily as needed.) 180 tablet 2   nitroGLYCERIN (NITROSTAT) 0.4 MG SL tablet Place 1 tablet (0.4 mg total) under the tongue every 5 (five) minutes as needed for chest pain (CP or SOB). 30 tablet 0    Nutritional Supplements (GLUCERNA 1.0 CAL/CARBSTEADY) LIQD Take 237 mLs by mouth 3 (three) times daily after meals. 21330 mL 12   oxyCODONE-acetaminophen (PERCOCET) 10-325 MG tablet Take 1 tablet by mouth every 6 (six) hours as needed. For pain. 8 tablet 0   traZODone (DESYREL) 100 MG tablet TAKE 1/2 TO 1 TABLET BY MOUTH AT BEDTIME AS NEEDED FOR SLEEP. 90 tablet 1   No current facility-administered medications on file prior to visit.    ROS: all negative except above.   Physical Exam: Filed Weights   04/30/21 1056  Weight: 189 lb 6.4 oz (85.9 kg)   BP (!) 192/84 (BP Location: Right Arm, Patient Position: Sitting, Cuff Size: Large)    Pulse 68    Temp (!) 97.5 F (36.4 C) (Temporal)    Ht 6\' 1"  (1.854 m)    Wt 189 lb 6.4 oz (85.9 kg)    SpO2 97%    BMI 24.99 kg/m  General Appearance: Well nourished, in no apparent distress. Eyes: PERRLA, EOMs, conjunctiva no swelling or erythema Sinuses: No Frontal/maxillary tenderness ENT/Mouth: Ext aud canals clear, TMs without erythema, bulging. No erythema, swelling, or exudate on post pharynx.  Tonsils not swollen or erythematous. Hearing normal.  Neck: Supple, thyroid normal.  Respiratory:  Respiratory effort normal, BS equal bilaterally without rales, rhonchi, wheezing or stridor.  Cardio: RRR with no MRGs. Brisk peripheral pulses without edema.  Abdomen: Soft, + BS.  Non tender, no guarding, rebound, hernias, masses. Lymphatics: Non tender without lymphadenopathy.  Musculoskeletal: Full ROM, 5/5 strength, normal gait.  Skin: Warm, dry without rashes, lesions, ecchymosis.  Neuro: Cranial nerves intact. Normal muscle tone, no cerebellar symptoms. Sensation intact.  Psych: Awake and oriented X 3, normal affect, Insight and Judgment appropriate.    [name] was seen today for hypertension.  Diagnoses and all orders for this visit:  Screening for diabetes mellitus -     HgB A1c 4.7 per ADA guidelines not a diabetic   Rash and nonspecific skin  eruption -     RPR -     hydrOXYzine (ATARAX) 10 MG tablet; Take 1 tablet (10 mg total) by mouth 3 (three) times daily as needed.     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   Other orders -     cloNIDine (CATAPRES) 0.1 MG tablet; TAKE 1 TABLET (0.1 MG) BY ORAL ROUTE 3 TIMES PER DAY    Kerin Perna, NP 11:25 AM

## 2021-04-30 NOTE — Patient Instructions (Signed)
Influenza, Adult °Influenza is also called "the flu." It is an infection in the lungs, nose, and throat (respiratory tract). It spreads easily from person to person (is contagious). The flu causes symptoms that are like a cold, along with high fever and body aches. °What are the causes? °This condition is caused by the influenza virus. You can get the virus by: °Breathing in droplets that are in the air after a person infected with the flu coughed or sneezed. °Touching something that has the virus on it and then touching your mouth, nose, or eyes. °What increases the risk? °Certain things may make you more likely to get the flu. These include: °Not washing your hands often. °Having close contact with many people during cold and flu season. °Touching your mouth, eyes, or nose without first washing your hands. °Not getting a flu shot every year. °You may have a higher risk for the flu, and serious problems, such as a lung infection (pneumonia), if you: °Are older than 65. °Are pregnant. °Have a weakened disease-fighting system (immune system) because of a disease or because you are taking certain medicines. °Have a long-term (chronic) condition, such as: °Heart, kidney, or lung disease. °Diabetes. °Asthma. °Have a liver disorder. °Are very overweight (morbidly obese). °Have anemia. °What are the signs or symptoms? °Symptoms usually begin suddenly and last 4-14 days. They may include: °Fever and chills. °Headaches, body aches, or muscle aches. °Sore throat. °Cough. °Runny or stuffy (congested) nose. °Feeling discomfort in your chest. °Not wanting to eat as much as normal. °Feeling weak or tired. °Feeling dizzy. °Feeling sick to your stomach or throwing up. °How is this treated? °If the flu is found early, you can be treated with antiviral medicine. This can help to reduce how bad the illness is and how long it lasts. This may be given by mouth or through an IV tube. °Taking care of yourself at home can help your  symptoms get better. Your doctor may want you to: °Take over-the-counter medicines. °Drink plenty of fluids. °The flu often goes away on its own. If you have very bad symptoms or other problems, you may be treated in a hospital. °Follow these instructions at home: °  °Activity °Rest as needed. Get plenty of sleep. °Stay home from work or school as told by your doctor. °Do not leave home until you do not have a fever for 24 hours without taking medicine. °Leave home only to go to your doctor. °Eating and drinking °Take an ORS (oral rehydration solution). This is a drink that is sold at pharmacies and stores. °Drink enough fluid to keep your pee pale yellow. °Drink clear fluids in small amounts as you are able. Clear fluids include: °Water. °Ice chips. °Fruit juice mixed with water. °Low-calorie sports drinks. °Eat bland foods that are easy to digest. Eat small amounts as you are able. These foods include: °Bananas. °Applesauce. °Rice. °Lean meats. °Toast. °Crackers. °Do not eat or drink: °Fluids that have a lot of sugar or caffeine. °Alcohol. °Spicy or fatty foods. °General instructions °Take over-the-counter and prescription medicines only as told by your doctor. °Use a cool mist humidifier to add moisture to the air in your home. This can make it easier for you to breathe. °When using a cool mist humidifier, clean it daily. Empty water and replace with clean water. °Cover your mouth and nose when you cough or sneeze. °Wash your hands with soap and water often and for at least 20 seconds. This is also important after   you cough or sneeze. If you cannot use soap and water, use alcohol-based hand sanitizer. °Keep all follow-up visits. °How is this prevented? ° °Get a flu shot every year. You may get the flu shot in late summer, fall, or winter. Ask your doctor when you should get your flu shot. °Avoid contact with people who are sick during fall and winter. This is cold and flu season. °Contact a doctor if: °You get  new symptoms. °You have: °Chest pain. °Watery poop (diarrhea). °A fever. °Your cough gets worse. °You start to have more mucus. °You feel sick to your stomach. °You throw up. °Get help right away if you: °Have shortness of breath. °Have trouble breathing. °Have skin or nails that turn a bluish color. °Have very bad pain or stiffness in your neck. °Get a sudden headache. °Get sudden pain in your face or ear. °Cannot eat or drink without throwing up. °These symptoms may represent a serious problem that is an emergency. Get medical help right away. Call your local emergency services (911 in the [place] °Do not wait to see if the symptoms will go away. °Do not drive yourself to the hospital. °Summary °Influenza is also called "the flu." It is an infection in the lungs, nose, and throat. It spreads easily from person to person. °Take over-the-counter and prescription medicines only as told by your doctor. °Getting a flu shot every year is the best way to not get the flu. °This information is not intended to replace advice given to you by your health care provider. Make sure you discuss any questions you have with your health care provider. °Document Revised: 12/20/2019 Document Reviewed: 12/20/2019 °Elsevier Patient Education © 2022 [employer] ° °

## 2021-05-01 LAB — RPR: RPR Ser Ql: NONREACTIVE

## 2021-05-03 DIAGNOSIS — I110 Hypertensive heart disease with heart failure: Secondary | ICD-10-CM | POA: Diagnosis not present

## 2021-05-04 DIAGNOSIS — I110 Hypertensive heart disease with heart failure: Secondary | ICD-10-CM | POA: Diagnosis not present

## 2021-05-05 DIAGNOSIS — I110 Hypertensive heart disease with heart failure: Secondary | ICD-10-CM | POA: Diagnosis not present

## 2021-05-06 DIAGNOSIS — I110 Hypertensive heart disease with heart failure: Secondary | ICD-10-CM | POA: Diagnosis not present

## 2021-05-07 DIAGNOSIS — I110 Hypertensive heart disease with heart failure: Secondary | ICD-10-CM | POA: Diagnosis not present

## 2021-05-10 DIAGNOSIS — I110 Hypertensive heart disease with heart failure: Secondary | ICD-10-CM | POA: Diagnosis not present

## 2021-05-11 DIAGNOSIS — I110 Hypertensive heart disease with heart failure: Secondary | ICD-10-CM | POA: Diagnosis not present

## 2021-05-12 DIAGNOSIS — I110 Hypertensive heart disease with heart failure: Secondary | ICD-10-CM | POA: Diagnosis not present

## 2021-05-13 DIAGNOSIS — I110 Hypertensive heart disease with heart failure: Secondary | ICD-10-CM | POA: Diagnosis not present

## 2021-05-14 DIAGNOSIS — I110 Hypertensive heart disease with heart failure: Secondary | ICD-10-CM | POA: Diagnosis not present

## 2021-05-17 DIAGNOSIS — I110 Hypertensive heart disease with heart failure: Secondary | ICD-10-CM | POA: Diagnosis not present

## 2021-05-18 DIAGNOSIS — I110 Hypertensive heart disease with heart failure: Secondary | ICD-10-CM | POA: Diagnosis not present

## 2021-05-19 DIAGNOSIS — I110 Hypertensive heart disease with heart failure: Secondary | ICD-10-CM | POA: Diagnosis not present

## 2021-05-20 DIAGNOSIS — I110 Hypertensive heart disease with heart failure: Secondary | ICD-10-CM | POA: Diagnosis not present

## 2021-05-21 DIAGNOSIS — I110 Hypertensive heart disease with heart failure: Secondary | ICD-10-CM | POA: Diagnosis not present

## 2021-05-24 ENCOUNTER — Other Ambulatory Visit (INDEPENDENT_AMBULATORY_CARE_PROVIDER_SITE_OTHER): Payer: Self-pay | Admitting: Primary Care

## 2021-05-24 ENCOUNTER — Telehealth (INDEPENDENT_AMBULATORY_CARE_PROVIDER_SITE_OTHER): Payer: Self-pay

## 2021-05-24 DIAGNOSIS — M5136 Other intervertebral disc degeneration, lumbar region: Secondary | ICD-10-CM

## 2021-05-24 DIAGNOSIS — I110 Hypertensive heart disease with heart failure: Secondary | ICD-10-CM | POA: Diagnosis not present

## 2021-05-24 MED ORDER — DICLOFENAC SODIUM 1 % EX GEL: 4.0000 g | Freq: Four times a day (QID) | CUTANEOUS | 1 refills | Status: DC

## 2021-05-24 NOTE — Telephone Encounter (Signed)
Patient called to make a medication request for a cream for his back   Patient uses [facility] [place]     Please advice [phone]

## 2021-05-25 DIAGNOSIS — I110 Hypertensive heart disease with heart failure: Secondary | ICD-10-CM | POA: Diagnosis not present

## 2021-05-26 DIAGNOSIS — I110 Hypertensive heart disease with heart failure: Secondary | ICD-10-CM | POA: Diagnosis not present

## 2021-05-27 DIAGNOSIS — I110 Hypertensive heart disease with heart failure: Secondary | ICD-10-CM | POA: Diagnosis not present

## 2021-05-28 ENCOUNTER — Other Ambulatory Visit (INDEPENDENT_AMBULATORY_CARE_PROVIDER_SITE_OTHER): Payer: Self-pay | Admitting: Primary Care

## 2021-05-28 DIAGNOSIS — I110 Hypertensive heart disease with heart failure: Secondary | ICD-10-CM | POA: Diagnosis not present

## 2021-05-31 DIAGNOSIS — I110 Hypertensive heart disease with heart failure: Secondary | ICD-10-CM | POA: Diagnosis not present

## 2021-06-01 DIAGNOSIS — I110 Hypertensive heart disease with heart failure: Secondary | ICD-10-CM | POA: Diagnosis not present

## 2021-06-02 DIAGNOSIS — I110 Hypertensive heart disease with heart failure: Secondary | ICD-10-CM | POA: Diagnosis not present

## 2021-06-03 DIAGNOSIS — I110 Hypertensive heart disease with heart failure: Secondary | ICD-10-CM | POA: Diagnosis not present

## 2021-06-04 DIAGNOSIS — I110 Hypertensive heart disease with heart failure: Secondary | ICD-10-CM | POA: Diagnosis not present

## 2021-06-07 DIAGNOSIS — I110 Hypertensive heart disease with heart failure: Secondary | ICD-10-CM | POA: Diagnosis not present

## 2021-06-08 DIAGNOSIS — I110 Hypertensive heart disease with heart failure: Secondary | ICD-10-CM | POA: Diagnosis not present

## 2021-06-09 DIAGNOSIS — I110 Hypertensive heart disease with heart failure: Secondary | ICD-10-CM | POA: Diagnosis not present

## 2021-06-10 DIAGNOSIS — I110 Hypertensive heart disease with heart failure: Secondary | ICD-10-CM | POA: Diagnosis not present

## 2021-06-22 DIAGNOSIS — M792 Neuralgia and neuritis, unspecified: Secondary | ICD-10-CM | POA: Diagnosis not present

## 2021-06-22 DIAGNOSIS — G629 Polyneuropathy, unspecified: Secondary | ICD-10-CM | POA: Diagnosis not present

## 2021-06-22 DIAGNOSIS — M549 Dorsalgia, unspecified: Secondary | ICD-10-CM | POA: Diagnosis not present

## 2021-06-29 ENCOUNTER — Other Ambulatory Visit (INDEPENDENT_AMBULATORY_CARE_PROVIDER_SITE_OTHER): Payer: Self-pay | Admitting: Primary Care

## 2021-06-29 NOTE — Telephone Encounter (Signed)
Requested Prescriptions  Pending Prescriptions Disp Refills   cloNIDine (CATAPRES) 0.1 MG tablet [Pharmacy Med Name: CLONIDINE HCL 0.1 MG ORAL TABLET] 90 tablet 1    Sig: TAKE 1 TABLET (0.1 MG) BY ORAL ROUTE 3 TIMES PER DAY     Cardiovascular:  Alpha-2 Agonists Failed - 06/29/2021  9:35 AM      Failed - Last BP in normal range    BP Readings from Last 1 Encounters:  04/30/21 (!) 192/84         Passed - Last Heart Rate in normal range    Pulse Readings from Last 1 Encounters:  04/30/21 68         Passed - Valid encounter within last 6 months    Recent Outpatient Visits          2 months ago Screening for diabetes mellitus   [place] Kerin Perna, NP   8 months ago URI with cough and congestion   [facility] Kerin Perna, NP   10 months ago Chronic sinusitis, unspecified location   [place] Kerin Perna, NP   1 year ago CKD (chronic kidney disease), stage IV ([place])   [place] [facility] Kerin Perna, NP   1 year ago Essential hypertension   [place] Kerin Perna, NP

## 2021-07-27 ENCOUNTER — Other Ambulatory Visit (INDEPENDENT_AMBULATORY_CARE_PROVIDER_SITE_OTHER): Payer: Self-pay | Admitting: Primary Care

## 2021-07-27 NOTE — Telephone Encounter (Signed)
Routed to PCP 

## 2021-09-13 DIAGNOSIS — I639 Cerebral infarction, unspecified: Secondary | ICD-10-CM

## 2021-09-13 HISTORY — DX: Cerebral infarction, unspecified: I63.9

## 2021-09-14 DIAGNOSIS — M5441 Lumbago with sciatica, right side: Secondary | ICD-10-CM | POA: Diagnosis not present

## 2021-10-23 ENCOUNTER — Other Ambulatory Visit (INDEPENDENT_AMBULATORY_CARE_PROVIDER_SITE_OTHER): Payer: Self-pay | Admitting: Primary Care

## 2021-10-23 DIAGNOSIS — F5101 Primary insomnia: Secondary | ICD-10-CM

## 2021-10-25 NOTE — Telephone Encounter (Signed)
Routed to PCP 

## 2021-11-18 DIAGNOSIS — Z7689 Persons encountering health services in other specified circumstances: Secondary | ICD-10-CM | POA: Diagnosis not present

## 2021-11-19 DIAGNOSIS — Z7689 Persons encountering health services in other specified circumstances: Secondary | ICD-10-CM | POA: Diagnosis not present

## 2021-11-22 DIAGNOSIS — Z7689 Persons encountering health services in other specified circumstances: Secondary | ICD-10-CM | POA: Diagnosis not present

## 2021-11-23 DIAGNOSIS — Z7689 Persons encountering health services in other specified circumstances: Secondary | ICD-10-CM | POA: Diagnosis not present

## 2021-11-24 DIAGNOSIS — Z7689 Persons encountering health services in other specified circumstances: Secondary | ICD-10-CM | POA: Diagnosis not present

## 2021-11-25 DIAGNOSIS — Z7689 Persons encountering health services in other specified circumstances: Secondary | ICD-10-CM | POA: Diagnosis not present

## 2021-11-26 ENCOUNTER — Ambulatory Visit (INDEPENDENT_AMBULATORY_CARE_PROVIDER_SITE_OTHER): Payer: Self-pay | Admitting: *Deleted

## 2021-11-26 DIAGNOSIS — Z7689 Persons encountering health services in other specified circumstances: Secondary | ICD-10-CM | POA: Diagnosis not present

## 2021-11-26 NOTE — Telephone Encounter (Signed)
Unable to reach pt, please assess and contact pt.

## 2021-11-26 NOTE — Telephone Encounter (Signed)
Summary: discuss procedure   Patient states pain management doctor wants him to have a epidural to help relieve his pain, patient states when previously having the procedure done, it blinded him in one eye   Patient inquiring if previous procedure is documented in his chart and if not can the provider document it   Please assist further     Unable to reach pt and there was no voicemail option.

## 2021-11-26 NOTE — Telephone Encounter (Signed)
No answer again, will try agaiin then forward to office.

## 2021-11-29 DIAGNOSIS — Z7689 Persons encountering health services in other specified circumstances: Secondary | ICD-10-CM | POA: Diagnosis not present

## 2021-11-30 DIAGNOSIS — Z7689 Persons encountering health services in other specified circumstances: Secondary | ICD-10-CM | POA: Diagnosis not present

## 2021-11-30 DIAGNOSIS — F1120 Opioid dependence, uncomplicated: Secondary | ICD-10-CM | POA: Insufficient documentation

## 2021-12-01 DIAGNOSIS — Z7689 Persons encountering health services in other specified circumstances: Secondary | ICD-10-CM | POA: Diagnosis not present

## 2021-12-02 DIAGNOSIS — Z7689 Persons encountering health services in other specified circumstances: Secondary | ICD-10-CM | POA: Diagnosis not present

## 2021-12-03 DIAGNOSIS — Z7689 Persons encountering health services in other specified circumstances: Secondary | ICD-10-CM | POA: Diagnosis not present

## 2021-12-03 NOTE — Telephone Encounter (Signed)
All attempts to reach patient have been unsuccessful. He will need to discuss this with pain management.

## 2021-12-06 DIAGNOSIS — Z7689 Persons encountering health services in other specified circumstances: Secondary | ICD-10-CM | POA: Diagnosis not present

## 2021-12-07 DIAGNOSIS — Z7689 Persons encountering health services in other specified circumstances: Secondary | ICD-10-CM | POA: Diagnosis not present

## 2021-12-08 DIAGNOSIS — Z7689 Persons encountering health services in other specified circumstances: Secondary | ICD-10-CM | POA: Diagnosis not present

## 2021-12-09 DIAGNOSIS — Z7689 Persons encountering health services in other specified circumstances: Secondary | ICD-10-CM | POA: Diagnosis not present

## 2021-12-10 DIAGNOSIS — Z7689 Persons encountering health services in other specified circumstances: Secondary | ICD-10-CM | POA: Diagnosis not present

## 2021-12-13 DIAGNOSIS — Z7689 Persons encountering health services in other specified circumstances: Secondary | ICD-10-CM | POA: Diagnosis not present

## 2021-12-14 DIAGNOSIS — Z7689 Persons encountering health services in other specified circumstances: Secondary | ICD-10-CM | POA: Diagnosis not present

## 2021-12-15 DIAGNOSIS — Z7689 Persons encountering health services in other specified circumstances: Secondary | ICD-10-CM | POA: Diagnosis not present

## 2021-12-16 DIAGNOSIS — Z7689 Persons encountering health services in other specified circumstances: Secondary | ICD-10-CM | POA: Diagnosis not present

## 2021-12-17 DIAGNOSIS — Z7689 Persons encountering health services in other specified circumstances: Secondary | ICD-10-CM | POA: Diagnosis not present

## 2021-12-20 DIAGNOSIS — Z7689 Persons encountering health services in other specified circumstances: Secondary | ICD-10-CM | POA: Diagnosis not present

## 2021-12-21 DIAGNOSIS — Z7689 Persons encountering health services in other specified circumstances: Secondary | ICD-10-CM | POA: Diagnosis not present

## 2021-12-22 DIAGNOSIS — Z7689 Persons encountering health services in other specified circumstances: Secondary | ICD-10-CM | POA: Diagnosis not present

## 2021-12-23 DIAGNOSIS — Z7689 Persons encountering health services in other specified circumstances: Secondary | ICD-10-CM | POA: Diagnosis not present

## 2021-12-24 DIAGNOSIS — Z7689 Persons encountering health services in other specified circumstances: Secondary | ICD-10-CM | POA: Diagnosis not present

## 2021-12-27 DIAGNOSIS — Z7689 Persons encountering health services in other specified circumstances: Secondary | ICD-10-CM | POA: Diagnosis not present

## 2021-12-28 DIAGNOSIS — Z7689 Persons encountering health services in other specified circumstances: Secondary | ICD-10-CM | POA: Diagnosis not present

## 2021-12-29 DIAGNOSIS — Z7689 Persons encountering health services in other specified circumstances: Secondary | ICD-10-CM | POA: Diagnosis not present

## 2021-12-30 ENCOUNTER — Other Ambulatory Visit (INDEPENDENT_AMBULATORY_CARE_PROVIDER_SITE_OTHER): Payer: Self-pay | Admitting: Primary Care

## 2021-12-30 DIAGNOSIS — Z7689 Persons encountering health services in other specified circumstances: Secondary | ICD-10-CM | POA: Diagnosis not present

## 2021-12-30 NOTE — Telephone Encounter (Signed)
Routed to PCP 

## 2021-12-31 DIAGNOSIS — Z7689 Persons encountering health services in other specified circumstances: Secondary | ICD-10-CM | POA: Diagnosis not present

## 2022-01-03 DIAGNOSIS — Z7689 Persons encountering health services in other specified circumstances: Secondary | ICD-10-CM | POA: Diagnosis not present

## 2022-01-04 DIAGNOSIS — Z7689 Persons encountering health services in other specified circumstances: Secondary | ICD-10-CM | POA: Diagnosis not present

## 2022-01-05 DIAGNOSIS — Z7689 Persons encountering health services in other specified circumstances: Secondary | ICD-10-CM | POA: Diagnosis not present

## 2022-01-06 DIAGNOSIS — Z7689 Persons encountering health services in other specified circumstances: Secondary | ICD-10-CM | POA: Diagnosis not present

## 2022-01-07 DIAGNOSIS — Z7689 Persons encountering health services in other specified circumstances: Secondary | ICD-10-CM | POA: Diagnosis not present

## 2022-01-10 DIAGNOSIS — Z7689 Persons encountering health services in other specified circumstances: Secondary | ICD-10-CM | POA: Diagnosis not present

## 2022-01-11 DIAGNOSIS — Z7689 Persons encountering health services in other specified circumstances: Secondary | ICD-10-CM | POA: Diagnosis not present

## 2022-01-12 DIAGNOSIS — Z7689 Persons encountering health services in other specified circumstances: Secondary | ICD-10-CM | POA: Diagnosis not present

## 2022-01-13 DIAGNOSIS — Z7689 Persons encountering health services in other specified circumstances: Secondary | ICD-10-CM | POA: Diagnosis not present

## 2022-01-14 DIAGNOSIS — Z7689 Persons encountering health services in other specified circumstances: Secondary | ICD-10-CM | POA: Diagnosis not present

## 2022-01-17 DIAGNOSIS — Z7689 Persons encountering health services in other specified circumstances: Secondary | ICD-10-CM | POA: Diagnosis not present

## 2022-01-18 DIAGNOSIS — Z7689 Persons encountering health services in other specified circumstances: Secondary | ICD-10-CM | POA: Diagnosis not present

## 2022-01-19 DIAGNOSIS — Z7689 Persons encountering health services in other specified circumstances: Secondary | ICD-10-CM | POA: Diagnosis not present

## 2022-01-20 ENCOUNTER — Ambulatory Visit: Payer: Medicaid Other | Admitting: Orthopedic Surgery

## 2022-01-20 DIAGNOSIS — B351 Tinea unguium: Secondary | ICD-10-CM | POA: Diagnosis not present

## 2022-01-20 DIAGNOSIS — L97521 Non-pressure chronic ulcer of other part of left foot limited to breakdown of skin: Secondary | ICD-10-CM

## 2022-01-21 ENCOUNTER — Encounter: Payer: Self-pay | Admitting: Orthopedic Surgery

## 2022-01-21 DIAGNOSIS — Z7689 Persons encountering health services in other specified circumstances: Secondary | ICD-10-CM | POA: Diagnosis not present

## 2022-01-21 NOTE — Progress Notes (Signed)
Office Visit Note   Patient: Cody Webster           Date of Birth: [birth year]           MRN: [id number] Visit Date: 01/20/2022              Requested by: Kerin Perna, NP [address] [place],  [place] [address] PCP: Kerin Perna, NP  Chief Complaint  Patient presents with   Left Foot - Pain   Right Foot - Pain      HPI: Patient is a 56 year old gentleman who is seen in follow-up for both lower extremities.  Patient complains of painful onychomycotic nails and ulceration plantar aspect left foot beneath the fourth and fifth metatarsal heads.  Assessment & Plan: Visit Diagnoses:  1. Onychomycosis   2. Non-pressure chronic ulcer of other part of left foot limited to breakdown of skin (HCC)     Plan: Ulcers were debrided beneath the fourth and fifth metatarsals nails were trimmed x10.  Follow-Up Instructions: Return in about 3 months (around 04/21/2022).   Ortho Exam  Patient is alert, oriented, no adenopathy, well-dressed, normal affect, normal respiratory effort. Examination patient has a palpable dorsalis pedis pulse bilaterally.  There no open ulcers dorsally on the feet he does have 2 Wagner grade 1 ulcers beneath the fourth and fifth metatarsal heads.  After informed consent a 10 blade knife was used debride the skin and soft tissue back to healthy viable tissue.  The ulcers were 1 cm diameter and 1 mm deep x2.  Patient has long discolored onychomycotic nails that he is unable to safely trim on his own and the nails were trimmed F68 without complications.  Imaging: No results found. No images are attached to the encounter.  Labs: Lab Results  Component Value Date   HGBA1C 4.7 04/30/2021   HGBA1C 4.7 09/25/2019   HGBA1C 5.1 07/20/2017   ESRSEDRATE 25 (H) 12/10/2013   CRP <0.5 (L) 12/10/2013   REPTSTATUS 04/30/2016 FINAL 04/24/2016   REPTSTATUS 04/30/2016 FINAL 04/24/2016   CULT  04/24/2016    NO GROWTH 5 DAYS Performed at [facility]  04/24/2016    NO GROWTH 5 DAYS Performed at [facility]      Lab Results  Component Value Date   ALBUMIN 3.8 04/07/2021   ALBUMIN 3.6 (L) 04/23/2020   ALBUMIN 4.0 08/30/2017    Lab Results  Component Value Date   MG 1.7 04/27/2016   MG 1.9 04/26/2016   MG 1.8 04/25/2016   No results found for: "VD25OH"  No results found for: "PREALBUMIN"    Latest Ref Rng & Units 04/07/2021   11:27 AM 10/02/2020   10:33 AM 05/27/2020    4:22 PM  CBC EXTENDED  WBC 3.4 - 10.8 x10E3/uL 4.4   4.1   RBC 4.14 - 5.80 x10E6/uL 3.12   2.83   Hemoglobin 13.0 - 17.7 g/dL 9.5  8.8  8.9   HCT 37.5 - 51.0 % 28.9   26.4   Platelets 150 - 450 x10E3/uL 200   185   NEUT# 1.4 - 7.0 x10E3/uL 2.9   2.6   Lymph# 0.7 - 3.1 x10E3/uL 0.8   0.9      There is no height or weight on file to calculate BMI.  Orders:  No orders of the defined types were placed in this encounter.  No orders of the defined types were placed in this encounter.    Procedures:  No procedures performed  Clinical Data: No additional findings.  ROS:  All other systems negative, except as noted in the HPI. Review of Systems  Objective: Vital Signs: There were no vitals taken for this visit.  Specialty Comments:  No specialty comments available.  PMFS History: Patient Active Problem List   Diagnosis Date Noted   OSA (obstructive sleep apnea) 12/05/2019   Chronic foot pain, right 04/02/2018   DDD (degenerative disc disease), lumbar 04/02/2018   Abnormal liver enzymes 03/30/2018   Abnormal cardiovascular stress test 02/08/2018   Preoperative cardiovascular examination 11/04/2016   Malnutrition of moderate degree ([place]) 01/12/2015   DM type 2, uncontrolled, with neuropathy 01/11/2015   Glaucoma    Eye pain    Acute glaucoma of right eye 09/06/2014   Acute renal failure superimposed on chronic kidney disease ([place]) 09/06/2014   Anemia 09/06/2014   Tobacco use disorder 09/06/2014   Frequent headaches  07/11/2014   CKD (chronic kidney disease), stage V (HCC) -> not yet on hemodialysis 07/11/2014   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, not on chronic dialysis ([place]) 07/11/2014   Ataxia 07/11/2014   CKD (chronic kidney disease) 12/11/2013   Diabetic foot ulcer ([place]) 12/10/2013   Essential hypertension 12/10/2013   Peripheral neuropathy ([place]) 12/10/2013   Pain, dental 10/08/2013   Osteomyelitis of ankle or foot, right, acute ([place]) 08/30/2013   Hyperlipidemia 10/20/2012   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   Depression    Diabetes mellitus without complication ([place])    type 2   GERD (gastroesophageal reflux disease)    Hypertension    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 ([place]) 2015   EF now improved back to normal mildly reduced   Peripheral neuropathy    Pneumonia    PTSD (post-traumatic stress disorder)    Seizures ([place])    27 years ago    Family History  Problem Relation Age of Onset   CAD Mother    Heart attack Mother    Diabetes type II Father    Stroke Father    CAD Father    Hypertension Sister    CAD Sister    Diabetes type II Brother    Colon cancer Maternal Uncle        2015   Colon polyps Neg Hx    Esophageal cancer Neg Hx    Stomach cancer Neg Hx    Rectal cancer Neg Hx     Past Surgical History:  Procedure Laterality Date   AV FISTULA PLACEMENT Left 11/08/2016   Procedure: ARTERIOVENOUS (AV) FISTULA CREATION;  Surgeon: Angelia Mould, MD;  Location: [facility];  Service: Vascular;  Laterality: Left;   AV FISTULA PLACEMENT Left 03/23/2020   Procedure: LEFT BASILIC VEIN FISTULA CREATION;  Surgeon: Angelia Mould, MD;  Location: [facility] OR;  Service: Vascular;  Laterality: Left;   NM MYOVIEW LTD  07/2017   "Moderate sized moderate severity"  (on cardiology was very small size, small intensity) partially reversible inferoapical//inferoseptal defect.  (Read as intermediate-high risk) -> over read by cardiology as [place]  09/2019   [facility] Small sized, mild severity reversible defect involving apical lateral & mid anterolateral wall thought to be artifacts - but CRO mild ischemia.  EF 55%. => Read as LOW RISK:   right foot surgery     TOE AMPUTATION     TRANSTHORACIC ECHOCARDIOGRAM  07/2017   EF 60%.,  Moderate LVH.  GR 1 DD.  No R WMA.  Mild RV and moderate RA dilation.   TRANSTHORACIC ECHOCARDIOGRAM  10/03/2019   [facility]: EF 40%, mild MR, mild aortic calcification   Social History   Occupational History    Comment: Disabled - for Back pain & foot ulcer  Tobacco Use   Smoking status: Every Day    Packs/day: 0.50    Years: 30.00    Total pack years: 15.00    Types: Cigarettes   Smokeless tobacco: Former    Types: Snuff, Chew    Quit date: 12/11/1983  Vaping Use   Vaping Use: Never used  Substance and Sexual Activity   Alcohol use: Not Currently    Comment: occasionally   Drug use: No   Sexual activity: Not Currently

## 2022-01-24 DIAGNOSIS — Z7689 Persons encountering health services in other specified circumstances: Secondary | ICD-10-CM | POA: Diagnosis not present

## 2022-01-25 DIAGNOSIS — Z7689 Persons encountering health services in other specified circumstances: Secondary | ICD-10-CM | POA: Diagnosis not present

## 2022-01-26 DIAGNOSIS — Z7689 Persons encountering health services in other specified circumstances: Secondary | ICD-10-CM | POA: Diagnosis not present

## 2022-01-27 DIAGNOSIS — Z7689 Persons encountering health services in other specified circumstances: Secondary | ICD-10-CM | POA: Diagnosis not present

## 2022-01-28 DIAGNOSIS — Z7689 Persons encountering health services in other specified circumstances: Secondary | ICD-10-CM | POA: Diagnosis not present

## 2022-01-31 DIAGNOSIS — Z7689 Persons encountering health services in other specified circumstances: Secondary | ICD-10-CM | POA: Diagnosis not present

## 2022-02-01 DIAGNOSIS — Z7689 Persons encountering health services in other specified circumstances: Secondary | ICD-10-CM | POA: Diagnosis not present

## 2022-02-02 DIAGNOSIS — Z7689 Persons encountering health services in other specified circumstances: Secondary | ICD-10-CM | POA: Diagnosis not present

## 2022-02-03 DIAGNOSIS — Z7689 Persons encountering health services in other specified circumstances: Secondary | ICD-10-CM | POA: Diagnosis not present

## 2022-02-04 DIAGNOSIS — Z7689 Persons encountering health services in other specified circumstances: Secondary | ICD-10-CM | POA: Diagnosis not present

## 2022-02-07 DIAGNOSIS — Z7689 Persons encountering health services in other specified circumstances: Secondary | ICD-10-CM | POA: Diagnosis not present

## 2022-02-08 DIAGNOSIS — Z7689 Persons encountering health services in other specified circumstances: Secondary | ICD-10-CM | POA: Diagnosis not present

## 2022-02-09 DIAGNOSIS — Z7689 Persons encountering health services in other specified circumstances: Secondary | ICD-10-CM | POA: Diagnosis not present

## 2022-02-10 DIAGNOSIS — Z7689 Persons encountering health services in other specified circumstances: Secondary | ICD-10-CM | POA: Diagnosis not present

## 2022-02-11 DIAGNOSIS — Z7689 Persons encountering health services in other specified circumstances: Secondary | ICD-10-CM | POA: Diagnosis not present

## 2022-02-14 DIAGNOSIS — Z7689 Persons encountering health services in other specified circumstances: Secondary | ICD-10-CM | POA: Diagnosis not present

## 2022-02-15 DIAGNOSIS — Z7689 Persons encountering health services in other specified circumstances: Secondary | ICD-10-CM | POA: Diagnosis not present

## 2022-02-16 DIAGNOSIS — Z7689 Persons encountering health services in other specified circumstances: Secondary | ICD-10-CM | POA: Diagnosis not present

## 2022-02-17 DIAGNOSIS — Z7689 Persons encountering health services in other specified circumstances: Secondary | ICD-10-CM | POA: Diagnosis not present

## 2022-02-18 DIAGNOSIS — Z7689 Persons encountering health services in other specified circumstances: Secondary | ICD-10-CM | POA: Diagnosis not present

## 2022-02-21 DIAGNOSIS — Z7689 Persons encountering health services in other specified circumstances: Secondary | ICD-10-CM | POA: Diagnosis not present

## 2022-02-22 DIAGNOSIS — Z7689 Persons encountering health services in other specified circumstances: Secondary | ICD-10-CM | POA: Diagnosis not present

## 2022-02-23 DIAGNOSIS — Z7689 Persons encountering health services in other specified circumstances: Secondary | ICD-10-CM | POA: Diagnosis not present

## 2022-02-24 DIAGNOSIS — Z7689 Persons encountering health services in other specified circumstances: Secondary | ICD-10-CM | POA: Diagnosis not present

## 2022-02-25 DIAGNOSIS — Z7689 Persons encountering health services in other specified circumstances: Secondary | ICD-10-CM | POA: Diagnosis not present

## 2022-02-28 DIAGNOSIS — Z7689 Persons encountering health services in other specified circumstances: Secondary | ICD-10-CM | POA: Diagnosis not present

## 2022-03-01 DIAGNOSIS — Z7689 Persons encountering health services in other specified circumstances: Secondary | ICD-10-CM | POA: Diagnosis not present

## 2022-03-02 DIAGNOSIS — Z7689 Persons encountering health services in other specified circumstances: Secondary | ICD-10-CM | POA: Diagnosis not present

## 2022-03-03 DIAGNOSIS — Z7689 Persons encountering health services in other specified circumstances: Secondary | ICD-10-CM | POA: Diagnosis not present

## 2022-03-04 DIAGNOSIS — Z7689 Persons encountering health services in other specified circumstances: Secondary | ICD-10-CM | POA: Diagnosis not present

## 2022-03-07 DIAGNOSIS — Z7689 Persons encountering health services in other specified circumstances: Secondary | ICD-10-CM | POA: Diagnosis not present

## 2022-03-08 DIAGNOSIS — Z7689 Persons encountering health services in other specified circumstances: Secondary | ICD-10-CM | POA: Diagnosis not present

## 2022-03-09 DIAGNOSIS — Z7689 Persons encountering health services in other specified circumstances: Secondary | ICD-10-CM | POA: Diagnosis not present

## 2022-03-10 DIAGNOSIS — Z7689 Persons encountering health services in other specified circumstances: Secondary | ICD-10-CM | POA: Diagnosis not present

## 2022-03-11 DIAGNOSIS — Z7689 Persons encountering health services in other specified circumstances: Secondary | ICD-10-CM | POA: Diagnosis not present

## 2022-03-14 DIAGNOSIS — Z7689 Persons encountering health services in other specified circumstances: Secondary | ICD-10-CM | POA: Diagnosis not present

## 2022-03-14 IMAGING — CR DG CHEST 2V
2 series · 2 of 2 positions shown · non-contrast
Comparison: 07/20/2017

CLINICAL DATA: Chest pain

EXAM:
CHEST - 2 VIEW

[w chest pa]
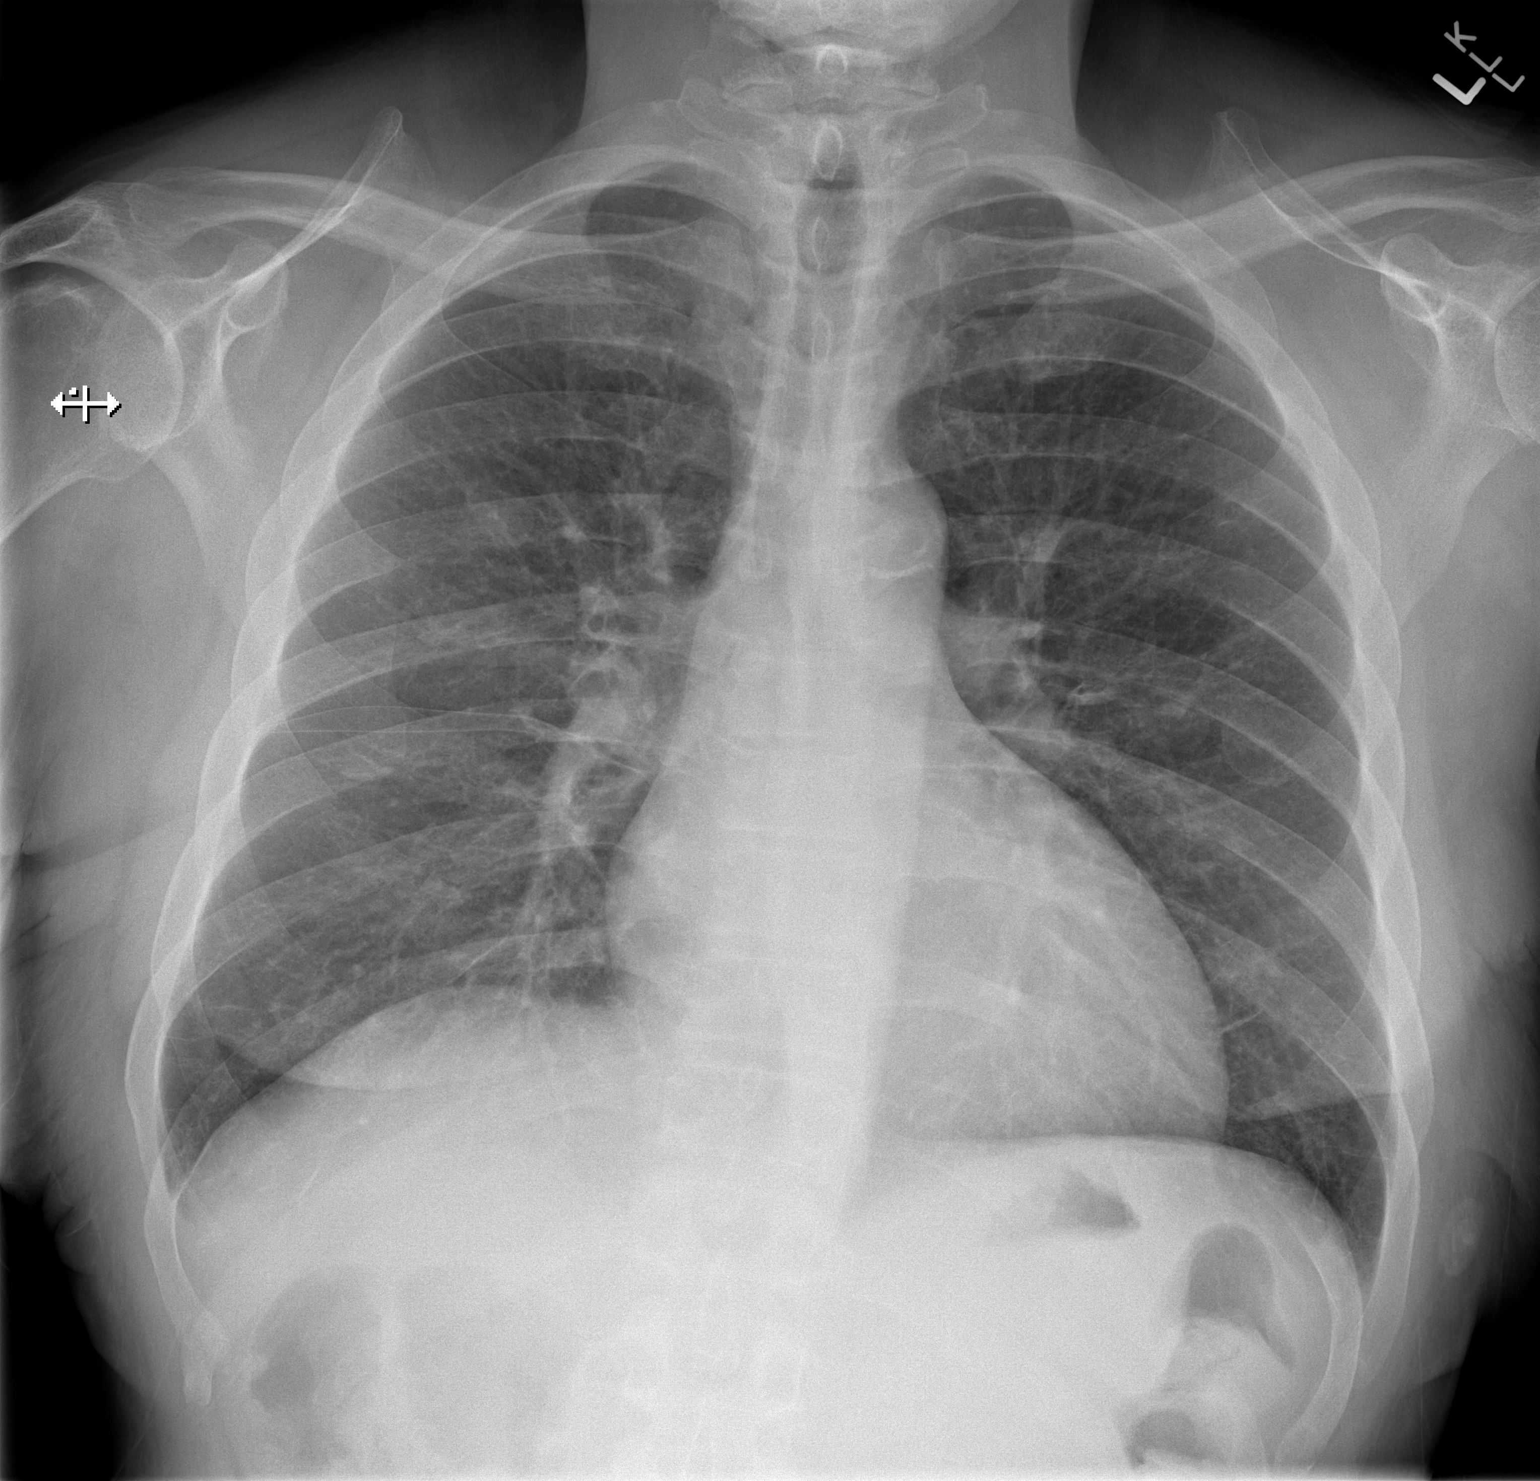

[w chest lat]
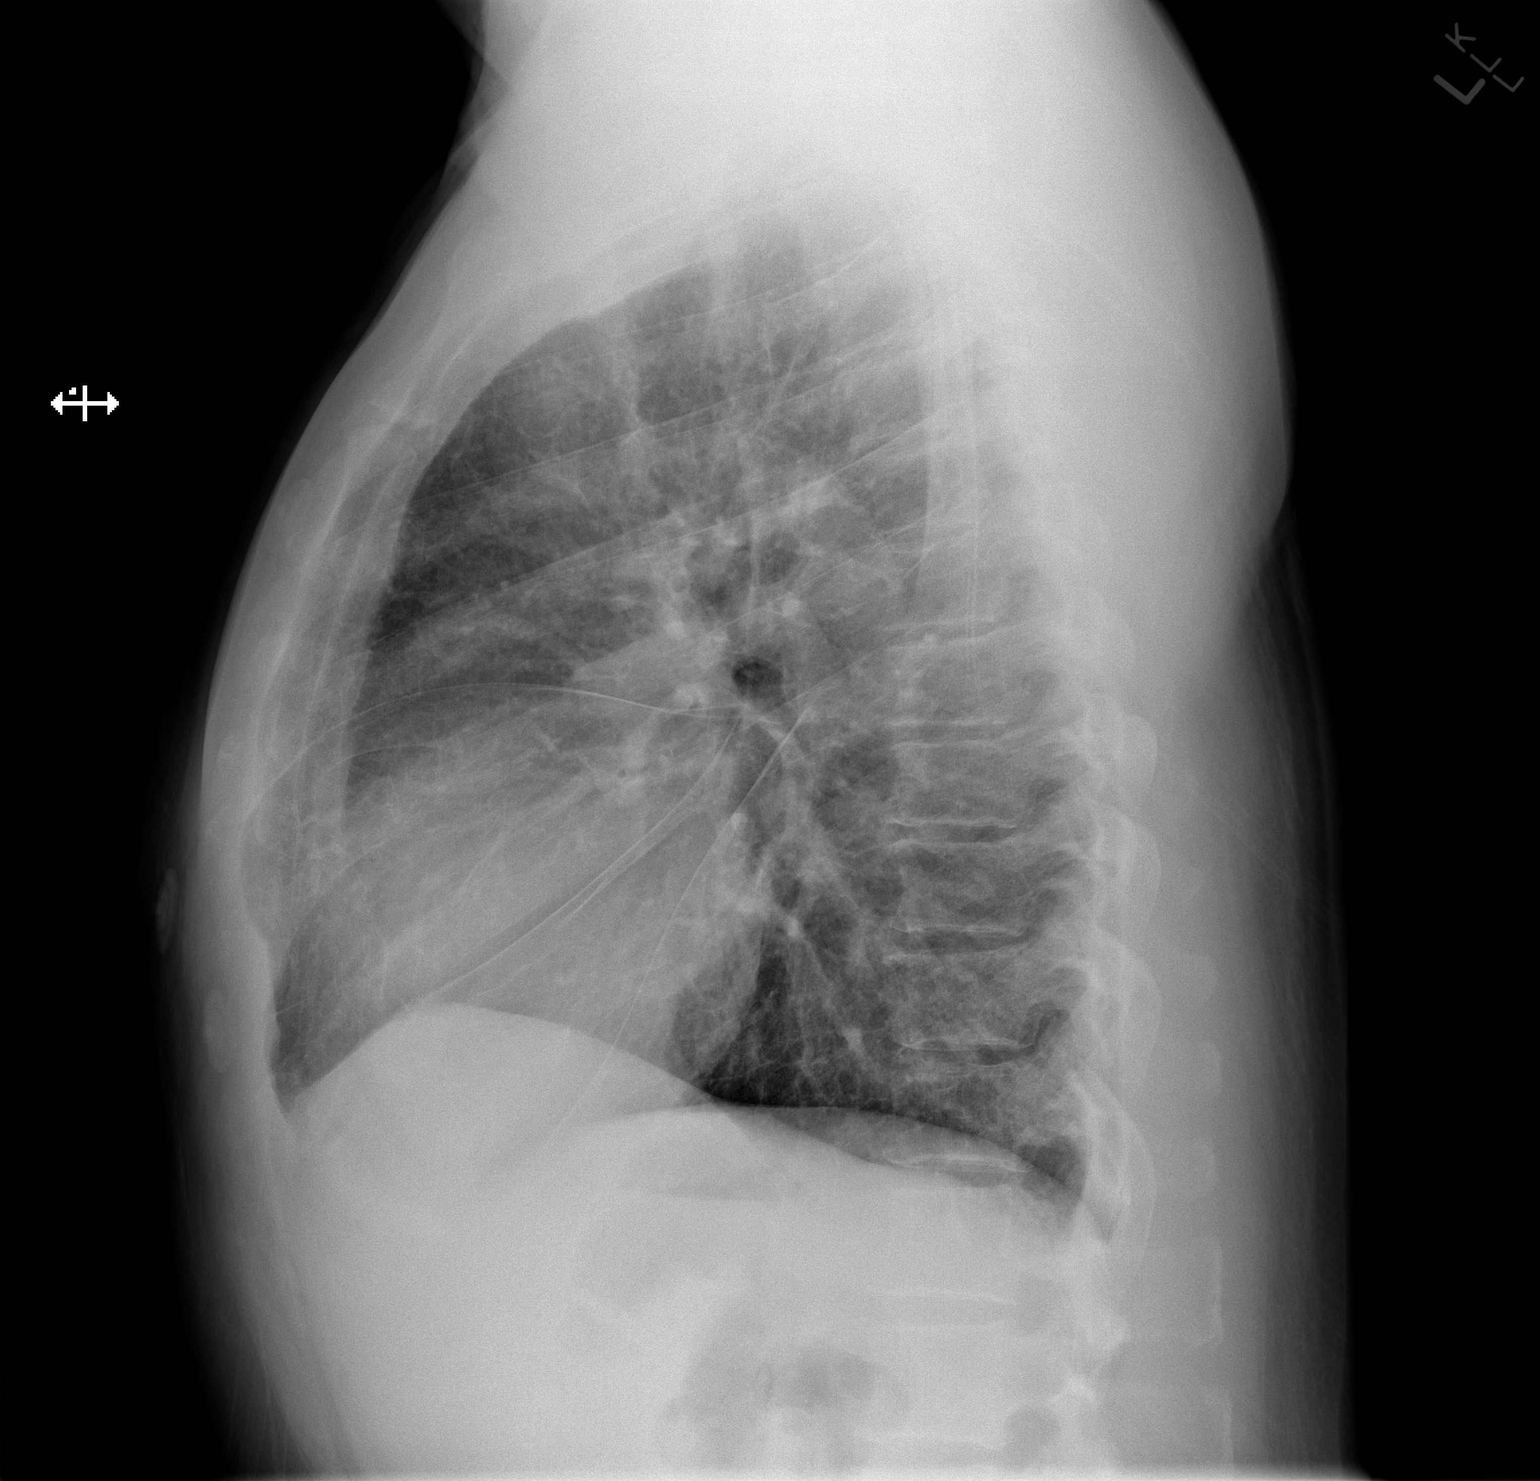

[2 of 2 positions shown; findings below may reference images not displayed]

FINDINGS: The heart size and mediastinal contours are within normal limits.
Both lungs are clear. Aortic atherosclerosis. The visualized
skeletal structures are unremarkable.
IMPRESSION: No active cardiopulmonary disease.

## 2022-03-15 ENCOUNTER — Other Ambulatory Visit (INDEPENDENT_AMBULATORY_CARE_PROVIDER_SITE_OTHER): Payer: Self-pay | Admitting: Primary Care

## 2022-03-15 ENCOUNTER — Telehealth (INDEPENDENT_AMBULATORY_CARE_PROVIDER_SITE_OTHER): Payer: Self-pay | Admitting: *Deleted

## 2022-03-15 DIAGNOSIS — R7612 Nonspecific reaction to cell mediated immunity measurement of gamma interferon antigen response without active tuberculosis: Secondary | ICD-10-CM

## 2022-03-15 DIAGNOSIS — Z7689 Persons encountering health services in other specified circumstances: Secondary | ICD-10-CM | POA: Diagnosis not present

## 2022-03-15 NOTE — Telephone Encounter (Signed)
Will forward to provider  

## 2022-03-15 NOTE — Telephone Encounter (Signed)
Spoke with Dr. Wilder Glade advise to order Cody Webster test and refer to ID

## 2022-03-15 NOTE — Telephone Encounter (Signed)
Dr. Camillia Herter , [facility] calling to speak to [facility] during practice's  lunch break. Requested to speak to nurse. States pt was evaluated for transplant 2 years ago. 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 States CXR done that same day was negative. He was referred to Infectious Disease but never kept appt.  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 States patients case is closed "Just a Quality Control issue but positive Q-Gold should be noted." States if need to speak to him, please call. [phone]

## 2022-03-16 ENCOUNTER — Ambulatory Visit (INDEPENDENT_AMBULATORY_CARE_PROVIDER_SITE_OTHER): Payer: Medicaid Other | Admitting: Primary Care

## 2022-03-16 ENCOUNTER — Other Ambulatory Visit (INDEPENDENT_AMBULATORY_CARE_PROVIDER_SITE_OTHER): Payer: Self-pay

## 2022-03-16 DIAGNOSIS — R7612 Nonspecific reaction to cell mediated immunity measurement of gamma interferon antigen response without active tuberculosis: Secondary | ICD-10-CM

## 2022-03-16 DIAGNOSIS — Z7689 Persons encountering health services in other specified circumstances: Secondary | ICD-10-CM | POA: Diagnosis not present

## 2022-03-17 DIAGNOSIS — Z7689 Persons encountering health services in other specified circumstances: Secondary | ICD-10-CM | POA: Diagnosis not present

## 2022-03-18 DIAGNOSIS — Z7689 Persons encountering health services in other specified circumstances: Secondary | ICD-10-CM | POA: Diagnosis not present

## 2022-03-19 LAB — QUANTIFERON-TB GOLD PLUS
QuantiFERON Mitogen Value: >10 IU/mL
QuantiFERON Nil Value: 0.22 IU/mL
QuantiFERON TB1 Ag Value: 0.73 IU/mL
QuantiFERON TB2 Ag Value: 0.7 IU/mL
QuantiFERON-TB Gold Plus: POSITIVE — AB

## 2022-03-21 DIAGNOSIS — Z7689 Persons encountering health services in other specified circumstances: Secondary | ICD-10-CM | POA: Diagnosis not present

## 2022-03-22 DIAGNOSIS — Z7689 Persons encountering health services in other specified circumstances: Secondary | ICD-10-CM | POA: Diagnosis not present

## 2022-03-23 ENCOUNTER — Other Ambulatory Visit (INDEPENDENT_AMBULATORY_CARE_PROVIDER_SITE_OTHER): Payer: Self-pay | Admitting: Primary Care

## 2022-03-23 DIAGNOSIS — R7612 Nonspecific reaction to cell mediated immunity measurement of gamma interferon antigen response without active tuberculosis: Secondary | ICD-10-CM

## 2022-03-23 DIAGNOSIS — Z7689 Persons encountering health services in other specified circumstances: Secondary | ICD-10-CM | POA: Diagnosis not present

## 2022-03-24 DIAGNOSIS — Z7689 Persons encountering health services in other specified circumstances: Secondary | ICD-10-CM | POA: Diagnosis not present

## 2022-03-25 DIAGNOSIS — Z7689 Persons encountering health services in other specified circumstances: Secondary | ICD-10-CM | POA: Diagnosis not present

## 2022-03-28 DIAGNOSIS — Z7689 Persons encountering health services in other specified circumstances: Secondary | ICD-10-CM | POA: Diagnosis not present

## 2022-03-29 ENCOUNTER — Other Ambulatory Visit: Payer: Self-pay

## 2022-03-29 ENCOUNTER — Ambulatory Visit: Payer: Medicaid Other | Admitting: Internal Medicine

## 2022-03-29 ENCOUNTER — Ambulatory Visit
Admission: RE | Admit: 2022-03-29 | Discharge: 2022-03-29 | Disposition: A | Discharge status: Home / Self Care | Payer: Medicaid Other | Source: Ambulatory Visit | Attending: Internal Medicine | Admitting: Internal Medicine

## 2022-03-29 ENCOUNTER — Encounter: Payer: Self-pay | Admitting: Internal Medicine

## 2022-03-29 VITALS — BP 165/75 | HR 66 | Temp 97.9°F | Wt 177.0 lb

## 2022-03-29 DIAGNOSIS — Z227 Latent tuberculosis: Secondary | ICD-10-CM | POA: Insufficient documentation

## 2022-03-29 DIAGNOSIS — R059 Cough, unspecified: Secondary | ICD-10-CM | POA: Diagnosis not present

## 2022-03-29 DIAGNOSIS — Z7689 Persons encountering health services in other specified circumstances: Secondary | ICD-10-CM | POA: Diagnosis not present

## 2022-03-29 MED ORDER — VITAMIN B-6 50 MG PO TABS: 50.0000 mg | ORAL_TABLET | Freq: Every day | ORAL | 5 refills | Status: DC

## 2022-03-29 MED ORDER — ISONIAZID 300 MG PO TABS: 300.0000 mg | ORAL_TABLET | Freq: Every day | ORAL | 5 refills | Status: DC

## 2022-03-29 NOTE — Progress Notes (Signed)
[facility] for Infectious Disease      Reason for Consult:positive Marland Kitchen test    Referring Physician: Dr. Oletta Lamas    Patient ID: Cody Webster, male    DOB: 1965/10/10, 56 y.o.   MRN: [id number]  HPI:   Mr. Sens is here for evaluation of the above positive test x 2. He was previously under consideration of renal transplant and had a positive test at [facility] in March of 2021 as part of the transplant evaluation.  He did not make an appointment that was set up with ID then and now is no longer a transplant candidate.  This was readdressed by his PCP and a repeat Quantiferon gold is positive again and he was referred here.  No history of jail, no known exposures or foreign residence.  No current night sweats, no fever, no weight loss.    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   Depression    Diabetes mellitus without complication ([place])    type 2   GERD (gastroesophageal reflux disease)    Hypertension    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 ([place]) 2015   EF now improved back to normal mildly reduced   Peripheral neuropathy    Pneumonia    PTSD (post-traumatic stress disorder)    Seizures ([place])    27 years ago    Prior to Admission medications   Medication Sig Start Date End Date Taking? Authorizing Provider  acetaminophen (TYLENOL) 500 MG tablet Take 1,000 mg by mouth every 6 (six) hours as needed (for pain/headaches.).   Yes [provider]  cetirizine (ZYRTEC) 10 MG tablet TAKE 1 TABLET (10 MG TOTAL) BY MOUTH DAILY. 07/27/21  Yes Kerin Perna, NP  cloNIDine (CATAPRES) 0.1 MG tablet TAKE 1 TABLET (0.1 MG) BY ORAL ROUTE 3 TIMES PER DAY 01/02/22  Yes Kerin Perna, NP  fluticasone (FLONASE) 50 MCG/ACT nasal spray Place 2 sprays into both nostrils daily. 08/25/20  Yes Kerin Perna, NP   gabapentin (NEURONTIN) 300 MG capsule Take 1 capsule (300 mg total) by mouth 2 (two) times daily. 05/27/20  Yes Kerin Perna, NP  guaiFENesin (MUCINEX) 600 MG 12 hr tablet Take 1 tablet (600 mg total) by mouth 2 (two) times daily as needed for to loosen phlegm. 10/01/20  Yes Kerin Perna, NP  oxyCODONE-acetaminophen (PERCOCET) 10-325 MG tablet Take by mouth. 03/02/22 05/31/22 Yes [provider]  traZODone (DESYREL) 100 MG tablet TAKE 1/2 TO 1 TABLET BY MOUTH AT BEDTIME AS NEEDED FOR SLEEP. 10/27/21  Yes Kerin Perna, NP  aspirin EC 81 MG tablet Take 81 mg by mouth every evening. Patient not taking: Reported on 03/29/2022    [provider]  diclofenac Sodium (VOLTAREN) 1 % GEL Apply 4 g topically 4 (four) times daily. Patient not taking: Reported on 03/29/2022 05/24/21   Kerin Perna, NP  hydrALAZINE (APRESOLINE) 50 MG tablet TAKE 2 TABLETS (100 MG TOTAL) BY MOUTH 3 (THREE) TIMES DAILY. Patient not taking: Reported on 03/29/2022 12/14/17   Leonie Man, MD  hydrOXYzine (ATARAX) 10 MG tablet Take 1 tablet (10 mg total) by mouth 3 (three) times daily as needed. Patient not taking: Reported on 03/29/2022 04/30/21   Kerin Perna, NP  nitroGLYCERIN (NITROSTAT) 0.4 MG SL tablet Place 1  tablet (0.4 mg total) under the tongue every 5 (five) minutes as needed for chest pain (CP or SOB). Patient not taking: Reported on 03/29/2022 07/22/17   Florencia Reasons, MD  Nutritional Supplements (GLUCERNA 1.0 CAL/CARBSTEADY) LIQD Take 237 mLs by mouth 3 (three) times daily after meals. Patient not taking: Reported on 03/29/2022 11/08/20   Kerin Perna, NP    Allergies  Allergen Reactions   Morphine And Related Shortness Of Breath and Other (See Comments)    Hallucination  Tolerates Norco/Vicodin    Social History   Tobacco Use   Smoking status: Every Day    Packs/day: 0.50    Years: 30.00    Total pack years: 15.00    Types: Cigarettes   Smokeless tobacco:  Former    Types: Snuff, Chew    Quit date: 12/11/1983  Vaping Use   Vaping Use: Never used  Substance Use Topics   Alcohol use: Not Currently    Comment: occasionally   Drug use: No    Family History  Problem Relation Age of Onset   CAD Mother    Heart attack Mother    Diabetes type II Father    Stroke Father    CAD Father    Hypertension Sister    CAD Sister    Diabetes type II Brother    Colon cancer Maternal Uncle        2015   Colon polyps Neg Hx    Esophageal cancer Neg Hx    Stomach cancer Neg Hx    Rectal cancer Neg Hx     Review of Systems  Constitutional: negative for fevers, chills, sweats, fatigue, and weight loss Respiratory: negative for cough All other systems reviewed and are negative    Constitutional: in no apparent distress  Vitals:   03/29/22 0847  BP: (!) 165/75  Pulse: 66  Temp: 97.9 F (36.6 C)  SpO2: 100%   EYES: anicteric ENMT: no thrush Respiratory: normal respiratory effort Musculoskeletal: no edema  Labs: Lab Results  Component Value Date   WBC 4.4 04/07/2021   HGB 9.5 (L) 04/07/2021   HCT 28.9 (L) 04/07/2021   MCV 93 04/07/2021   PLT 200 04/07/2021    Lab Results  Component Value Date   CREATININE 9.12 (H) 04/07/2021   BUN 51 (H) 04/07/2021   NA 139 04/07/2021   K 5.5 (H) 04/07/2021   CL 107 (H) 04/07/2021   CO2 15 (L) 04/07/2021    Lab Results  Component Value Date   ALT 9 04/07/2021   AST 7 04/07/2021   ALKPHOS 203 (H) 04/07/2021   BILITOT 0.3 04/07/2021     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  Indication for testing was transplant evaluation, though not currently considering.  I discussed the likelihood of reactivation quoted at approximately 5% and treatment options.  Will avoid rifampin with his current medications.   Plan: 1)  CXR 2) hepatic function panel  + HIV 2) if #1 is negative, start INH + B6 for 6 months Follow up in 1 month, sooner if issues on the CXR

## 2022-03-30 ENCOUNTER — Telehealth: Payer: Self-pay

## 2022-03-30 DIAGNOSIS — Z7689 Persons encountering health services in other specified circumstances: Secondary | ICD-10-CM | POA: Diagnosis not present

## 2022-03-30 LAB — HEPATIC FUNCTION PANEL
AG Ratio: 1.3 (calc) (ref 1.0–2.5)
ALT: 10 U/L (ref 9–46)
AST: 7 U/L — ABNORMAL LOW (ref 10–35)
Albumin: 3.5 g/dL — ABNORMAL LOW (ref 3.6–5.1)
Alkaline phosphatase (APISO): 153 U/L — ABNORMAL HIGH (ref 35–144)
Bilirubin, Direct: 0.1 mg/dL (ref 0.0–0.2)
Globulin: 2.8 g/dL (calc) (ref 1.9–3.7)
Indirect Bilirubin: 0.2 mg/dL (calc) (ref 0.2–1.2)
Total Bilirubin: 0.3 mg/dL (ref 0.2–1.2)
Total Protein: 6.3 g/dL (ref 6.1–8.1)

## 2022-03-30 LAB — HIV ANTIBODY (ROUTINE TESTING W REFLEX): HIV 1&2 Ab, 4th Generation: NONREACTIVE

## 2022-03-30 NOTE — Telephone Encounter (Signed)
Called patient regarding chest x ray results. Patient was able to take call and understands he can start his latent TB treatment today.  Did not have any questions during call.  Leatrice Jewels, RMA

## 2022-03-30 NOTE — Telephone Encounter (Signed)
-----   Message from Thayer Headings, MD sent at 03/30/2022  3:49 PM EST ----- Please let him know his CXR looks good, no concerns (he was a bit worried).  He can start or continue with the iNH and B6.  thanks

## 2022-03-31 DIAGNOSIS — Z7689 Persons encountering health services in other specified circumstances: Secondary | ICD-10-CM | POA: Diagnosis not present

## 2022-04-01 ENCOUNTER — Other Ambulatory Visit (INDEPENDENT_AMBULATORY_CARE_PROVIDER_SITE_OTHER): Payer: Self-pay | Admitting: Primary Care

## 2022-04-01 DIAGNOSIS — Z7689 Persons encountering health services in other specified circumstances: Secondary | ICD-10-CM | POA: Diagnosis not present

## 2022-04-02 DIAGNOSIS — Z7689 Persons encountering health services in other specified circumstances: Secondary | ICD-10-CM | POA: Diagnosis not present

## 2022-04-03 DIAGNOSIS — Z7689 Persons encountering health services in other specified circumstances: Secondary | ICD-10-CM | POA: Diagnosis not present

## 2022-04-04 DIAGNOSIS — Z7689 Persons encountering health services in other specified circumstances: Secondary | ICD-10-CM | POA: Diagnosis not present

## 2022-04-05 DIAGNOSIS — Z7689 Persons encountering health services in other specified circumstances: Secondary | ICD-10-CM | POA: Diagnosis not present

## 2022-04-06 DIAGNOSIS — Z7689 Persons encountering health services in other specified circumstances: Secondary | ICD-10-CM | POA: Diagnosis not present

## 2022-04-21 ENCOUNTER — Ambulatory Visit: Payer: Medicaid Other | Admitting: Orthopedic Surgery

## 2022-04-21 ENCOUNTER — Encounter: Payer: Self-pay | Admitting: Orthopedic Surgery

## 2022-04-21 DIAGNOSIS — L97521 Non-pressure chronic ulcer of other part of left foot limited to breakdown of skin: Secondary | ICD-10-CM | POA: Diagnosis not present

## 2022-04-21 DIAGNOSIS — B351 Tinea unguium: Secondary | ICD-10-CM

## 2022-04-21 MED ORDER — GABAPENTIN 300 MG PO CAPS: 300.0000 mg | ORAL_CAPSULE | Freq: Two times a day (BID) | ORAL | 1 refills | Status: DC

## 2022-04-21 NOTE — Progress Notes (Signed)
Office Visit Note   Patient: Cody Webster           Date of Birth: [birth year]           MRN: [id number] Visit Date: 04/21/2022              Requested by: Kerin Perna, NP [place],  [place] [address] PCP: Kerin Perna, NP  Chief Complaint  Patient presents with   Right Foot - Follow-up    Bilateral foot nail trim    Left Foot - Follow-up      HPI: Patient is a 56 year old gentleman who is seen in follow-up for Wagner grade 1 ulcer plantar aspect of the left foot as well as painful onychomycotic nails.  Patient states he has been having increased pain from his neuropathy and he has run out of his Neurontin.  Assessment & Plan: Visit Diagnoses:  1. Onychomycosis   2. Non-pressure chronic ulcer of other part of left foot limited to breakdown of skin [facility])     Plan: Prescription was sent in for Neurontin.  The ulcer was debrided nails were trimmed.  Follow-Up Instructions: Return in about 3 months (around 07/21/2022).   Ortho Exam  Patient is alert, oriented, no adenopathy, well-dressed, normal affect, normal respiratory effort. Examination patient has a Wagner grade 1 ulcer over the plantar lateral aspect the left foot.  After informed consent a 10 blade knife was used debride the skin and soft tissue back to healthy viable granulation tissue there is no exposed bone or tendon.  Patient has a palpable dorsalis pedis pulse.  He has no plantar ulcers on the right foot no ascending cellulitis there is venous stasis swelling without venous ulcers.  He has thickened discolored onychomycotic nails x 8 he is unable to trim the nails on his own and the nails were trimmed x 8 without complications.  Imaging: No results found. No images are attached to the encounter.  Labs: Lab Results  Component Value Date   HGBA1C 4.7 04/30/2021   HGBA1C 4.7 09/25/2019   HGBA1C 5.1 07/20/2017   ESRSEDRATE 25 (H) 12/10/2013   CRP <0.5 (L) 12/10/2013    REPTSTATUS 04/30/2016 FINAL 04/24/2016   REPTSTATUS 04/30/2016 FINAL 04/24/2016   CULT  04/24/2016    NO GROWTH 5 DAYS Performed at [facility]  04/24/2016    NO GROWTH 5 DAYS Performed at [facility]      Lab Results  Component Value Date   ALBUMIN 3.8 04/07/2021   ALBUMIN 3.6 (L) 04/23/2020   ALBUMIN 4.0 08/30/2017    Lab Results  Component Value Date   MG 1.7 04/27/2016   MG 1.9 04/26/2016   MG 1.8 04/25/2016   No results found for: "VD25OH"  No results found for: "PREALBUMIN"    Latest Ref Rng & Units 04/07/2021   11:27 AM 10/02/2020   10:33 AM 05/27/2020    4:22 PM  CBC EXTENDED  WBC 3.4 - 10.8 x10E3/uL 4.4   4.1   RBC 4.14 - 5.80 x10E6/uL 3.12   2.83   Hemoglobin 13.0 - 17.7 g/dL 9.5  8.8  8.9   HCT 37.5 - 51.0 % 28.9   26.4   Platelets 150 - 450 x10E3/uL 200   185   NEUT# 1.4 - 7.0 x10E3/uL 2.9   2.6   Lymph# 0.7 - 3.1 x10E3/uL 0.8   0.9      There is no height or  weight on file to calculate BMI.  Orders:  No orders of the defined types were placed in this encounter.  Meds ordered this encounter  Medications   gabapentin (NEURONTIN) 300 MG capsule    Sig: Take 1 capsule (300 mg total) by mouth 2 (two) times daily.    Dispense:  180 capsule    Refill:  1     Procedures: No procedures performed  Clinical Data: No additional findings.  ROS:  All other systems negative, except as noted in the HPI. Review of Systems  Objective: Vital Signs: There were no vitals taken for this visit.  Specialty Comments:  No specialty comments available.  PMFS History: Patient Active Problem List   Diagnosis Date Noted   TB lung, latent 03/29/2022   OSA (obstructive sleep apnea) 12/05/2019   Chronic foot pain, right 04/02/2018   DDD (degenerative disc disease), lumbar 04/02/2018   Abnormal liver enzymes 03/30/2018   Abnormal cardiovascular stress test 02/08/2018   Preoperative cardiovascular examination 11/04/2016    Malnutrition of moderate degree ([place]) 01/12/2015   DM type 2, uncontrolled, with neuropathy 01/11/2015   Glaucoma    Eye pain    Acute glaucoma of right eye 09/06/2014   Acute renal failure superimposed on chronic kidney disease ([place]) 09/06/2014   Anemia 09/06/2014   Tobacco use disorder 09/06/2014   Frequent headaches 07/11/2014   CKD (chronic kidney disease), stage V (HCC) -> not yet on hemodialysis 07/11/2014   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, not on chronic dialysis ([place]) 07/11/2014   Ataxia 07/11/2014   CKD (chronic kidney disease) 12/11/2013   Diabetic foot ulcer ([place]) 12/10/2013   Essential hypertension 12/10/2013   Peripheral neuropathy ([place]) 12/10/2013   Pain, dental 10/08/2013   Osteomyelitis of ankle or foot, right, acute ([place]) 08/30/2013   Hyperlipidemia 10/20/2012   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   Depression    Diabetes mellitus without complication ([place])    type 2   GERD (gastroesophageal reflux disease)    Hypertension    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 ([place]) 2015   EF now improved back to normal mildly reduced   Peripheral neuropathy    Pneumonia    PTSD (post-traumatic stress disorder)    Seizures ([place])    27 years ago    Family History  Problem Relation Age of Onset   CAD Mother    Heart attack Mother    Diabetes type II Father    Stroke Father    CAD Father    Hypertension Sister    CAD Sister    Diabetes type II Brother    Colon cancer Maternal Uncle        2015   Colon polyps Neg Hx    Esophageal cancer Neg Hx    Stomach cancer Neg Hx    Rectal cancer Neg Hx     Past Surgical History:  Procedure Laterality Date   AV FISTULA PLACEMENT Left 11/08/2016   Procedure: ARTERIOVENOUS (AV) FISTULA CREATION;  Surgeon:  Angelia Mould, MD;  Location: [facility];  Service: Vascular;  Laterality: Left;   AV FISTULA PLACEMENT Left 03/23/2020   Procedure: LEFT BASILIC VEIN FISTULA CREATION;  Surgeon: Angelia Mould, MD;  Location: [facility] OR;  Service: Vascular;  Laterality: Left;   NM MYOVIEW LTD  07/2017   "Moderate sized moderate severity" (on cardiology was very small size, small intensity) partially reversible inferoapical//inferoseptal defect.  (Read as intermediate-high risk) -> over read by cardiology as [place]  09/2019   [facility] Small sized, mild severity reversible defect involving apical lateral & mid anterolateral wall thought to be artifacts - but CRO mild ischemia.  EF 55%. => Read as LOW RISK:   right foot surgery     TOE AMPUTATION     TRANSTHORACIC ECHOCARDIOGRAM  07/2017   EF 60%.,  Moderate LVH.  GR 1 DD.  No R WMA.  Mild RV and moderate RA dilation.   TRANSTHORACIC ECHOCARDIOGRAM  10/03/2019   [facility]: EF 40%, mild MR, mild aortic calcification   Social History   Occupational History    Comment: Disabled - for Back pain & foot ulcer  Tobacco Use   Smoking status: Every Day    Packs/day: 0.50    Years: 30.00    Total pack years: 15.00    Types: Cigarettes   Smokeless tobacco: Former    Types: Snuff, Chew    Quit date: 12/11/1983  Vaping Use   Vaping Use: Never used  Substance and Sexual Activity   Alcohol use: Not Currently    Comment: occasionally   Drug use: No   Sexual activity: Not Currently

## 2022-05-04 ENCOUNTER — Emergency Department (HOSPITAL_COMMUNITY): Payer: Medicaid Other

## 2022-05-04 ENCOUNTER — Ambulatory Visit: Payer: Medicaid Other | Admitting: Infectious Disease

## 2022-05-04 ENCOUNTER — Inpatient Hospital Stay (HOSPITAL_COMMUNITY)
Admission: EM | Admit: 2022-05-04 | Discharge: 2022-05-12 | DRG: 302 | Disposition: A | Discharge status: Home / Self Care | Payer: Medicaid Other | Attending: Family Medicine | Admitting: Family Medicine

## 2022-05-04 ENCOUNTER — Other Ambulatory Visit: Payer: Self-pay

## 2022-05-04 ENCOUNTER — Ambulatory Visit (INDEPENDENT_AMBULATORY_CARE_PROVIDER_SITE_OTHER): Payer: Self-pay

## 2022-05-04 DIAGNOSIS — I2489 Other forms of acute ischemic heart disease: Secondary | ICD-10-CM | POA: Diagnosis present

## 2022-05-04 DIAGNOSIS — D649 Anemia, unspecified: Secondary | ICD-10-CM | POA: Diagnosis not present

## 2022-05-04 DIAGNOSIS — D631 Anemia in chronic kidney disease: Secondary | ICD-10-CM | POA: Diagnosis present

## 2022-05-04 DIAGNOSIS — I200 Unstable angina: Secondary | ICD-10-CM | POA: Diagnosis present

## 2022-05-04 DIAGNOSIS — Z66 Do not resuscitate: Secondary | ICD-10-CM | POA: Diagnosis present

## 2022-05-04 DIAGNOSIS — E785 Hyperlipidemia, unspecified: Secondary | ICD-10-CM | POA: Diagnosis present

## 2022-05-04 DIAGNOSIS — M549 Dorsalgia, unspecified: Secondary | ICD-10-CM | POA: Diagnosis present

## 2022-05-04 DIAGNOSIS — R7989 Other specified abnormal findings of blood chemistry: Secondary | ICD-10-CM | POA: Diagnosis present

## 2022-05-04 DIAGNOSIS — I132 Hypertensive heart and chronic kidney disease with heart failure and with stage 5 chronic kidney disease, or end stage renal disease: Secondary | ICD-10-CM | POA: Diagnosis not present

## 2022-05-04 DIAGNOSIS — Z800 Family history of malignant neoplasm of digestive organs: Secondary | ICD-10-CM

## 2022-05-04 DIAGNOSIS — R0602 Shortness of breath: Secondary | ICD-10-CM | POA: Diagnosis not present

## 2022-05-04 DIAGNOSIS — I6381 Other cerebral infarction due to occlusion or stenosis of small artery: Secondary | ICD-10-CM | POA: Diagnosis not present

## 2022-05-04 DIAGNOSIS — E872 Acidosis, unspecified: Secondary | ICD-10-CM | POA: Insufficient documentation

## 2022-05-04 DIAGNOSIS — Z8249 Family history of ischemic heart disease and other diseases of the circulatory system: Secondary | ICD-10-CM

## 2022-05-04 DIAGNOSIS — Z79899 Other long term (current) drug therapy: Secondary | ICD-10-CM

## 2022-05-04 DIAGNOSIS — R079 Chest pain, unspecified: Secondary | ICD-10-CM | POA: Diagnosis not present

## 2022-05-04 DIAGNOSIS — I080 Rheumatic disorders of both mitral and aortic valves: Secondary | ICD-10-CM | POA: Diagnosis present

## 2022-05-04 DIAGNOSIS — Z91158 Patient's noncompliance with renal dialysis for other reason: Secondary | ICD-10-CM

## 2022-05-04 DIAGNOSIS — Z823 Family history of stroke: Secondary | ICD-10-CM

## 2022-05-04 DIAGNOSIS — I509 Heart failure, unspecified: Secondary | ICD-10-CM | POA: Diagnosis not present

## 2022-05-04 DIAGNOSIS — R471 Dysarthria and anarthria: Secondary | ICD-10-CM | POA: Diagnosis not present

## 2022-05-04 DIAGNOSIS — Z833 Family history of diabetes mellitus: Secondary | ICD-10-CM

## 2022-05-04 DIAGNOSIS — N170 Acute kidney failure with tubular necrosis: Secondary | ICD-10-CM

## 2022-05-04 DIAGNOSIS — Z1152 Encounter for screening for COVID-19: Secondary | ICD-10-CM

## 2022-05-04 DIAGNOSIS — R29704 NIHSS score 4: Secondary | ICD-10-CM | POA: Diagnosis not present

## 2022-05-04 DIAGNOSIS — Z992 Dependence on renal dialysis: Secondary | ICD-10-CM

## 2022-05-04 DIAGNOSIS — K219 Gastro-esophageal reflux disease without esophagitis: Secondary | ICD-10-CM | POA: Diagnosis present

## 2022-05-04 DIAGNOSIS — N186 End stage renal disease: Secondary | ICD-10-CM | POA: Diagnosis present

## 2022-05-04 DIAGNOSIS — Z885 Allergy status to narcotic agent status: Secondary | ICD-10-CM

## 2022-05-04 DIAGNOSIS — Z227 Latent tuberculosis: Secondary | ICD-10-CM | POA: Diagnosis present

## 2022-05-04 DIAGNOSIS — I5042 Chronic combined systolic (congestive) and diastolic (congestive) heart failure: Secondary | ICD-10-CM | POA: Diagnosis not present

## 2022-05-04 DIAGNOSIS — M5136 Other intervertebral disc degeneration, lumbar region: Secondary | ICD-10-CM

## 2022-05-04 DIAGNOSIS — N185 Chronic kidney disease, stage 5: Secondary | ICD-10-CM | POA: Diagnosis not present

## 2022-05-04 DIAGNOSIS — E875 Hyperkalemia: Secondary | ICD-10-CM | POA: Diagnosis not present

## 2022-05-04 DIAGNOSIS — I429 Cardiomyopathy, unspecified: Secondary | ICD-10-CM | POA: Diagnosis present

## 2022-05-04 DIAGNOSIS — I10 Essential (primary) hypertension: Secondary | ICD-10-CM | POA: Diagnosis present

## 2022-05-04 DIAGNOSIS — G8929 Other chronic pain: Secondary | ICD-10-CM | POA: Diagnosis present

## 2022-05-04 DIAGNOSIS — M898X9 Other specified disorders of bone, unspecified site: Secondary | ICD-10-CM | POA: Diagnosis present

## 2022-05-04 DIAGNOSIS — I110 Hypertensive heart disease with heart failure: Secondary | ICD-10-CM | POA: Diagnosis not present

## 2022-05-04 DIAGNOSIS — I272 Pulmonary hypertension, unspecified: Secondary | ICD-10-CM | POA: Diagnosis present

## 2022-05-04 DIAGNOSIS — I25110 Atherosclerotic heart disease of native coronary artery with unstable angina pectoris: Principal | ICD-10-CM | POA: Diagnosis present

## 2022-05-04 DIAGNOSIS — F17210 Nicotine dependence, cigarettes, uncomplicated: Secondary | ICD-10-CM | POA: Diagnosis present

## 2022-05-04 DIAGNOSIS — Z7982 Long term (current) use of aspirin: Secondary | ICD-10-CM

## 2022-05-04 DIAGNOSIS — I639 Cerebral infarction, unspecified: Secondary | ICD-10-CM | POA: Insufficient documentation

## 2022-05-04 DIAGNOSIS — R0789 Other chest pain: Secondary | ICD-10-CM | POA: Diagnosis not present

## 2022-05-04 DIAGNOSIS — G4700 Insomnia, unspecified: Secondary | ICD-10-CM | POA: Diagnosis present

## 2022-05-04 DIAGNOSIS — R29810 Facial weakness: Secondary | ICD-10-CM | POA: Diagnosis not present

## 2022-05-04 DIAGNOSIS — Z6821 Body mass index (BMI) 21.0-21.9, adult: Secondary | ICD-10-CM

## 2022-05-04 DIAGNOSIS — R451 Restlessness and agitation: Secondary | ICD-10-CM | POA: Diagnosis not present

## 2022-05-04 DIAGNOSIS — G629 Polyneuropathy, unspecified: Secondary | ICD-10-CM | POA: Diagnosis present

## 2022-05-04 DIAGNOSIS — R627 Adult failure to thrive: Secondary | ICD-10-CM | POA: Diagnosis present

## 2022-05-04 DIAGNOSIS — E1122 Type 2 diabetes mellitus with diabetic chronic kidney disease: Secondary | ICD-10-CM | POA: Diagnosis present

## 2022-05-04 LAB — BASIC METABOLIC PANEL
Anion gap: 8 (ref 5–15)
BUN: 54 mg/dL — ABNORMAL HIGH (ref 6–20)
CO2: 20 mmol/L — ABNORMAL LOW (ref 22–32)
Calcium: 8.3 mg/dL — ABNORMAL LOW (ref 8.9–10.3)
Chloride: 109 mmol/L (ref 98–111)
Creatinine, Ser: 9.84 mg/dL — ABNORMAL HIGH (ref 0.61–1.24)
GFR, Estimated: 6 mL/min — ABNORMAL LOW (ref >60)
Glucose, Bld: 83 mg/dL (ref 70–99)
Potassium: 5.5 mmol/L — ABNORMAL HIGH (ref 3.5–5.1)
Sodium: 137 mmol/L (ref 135–145)

## 2022-05-04 LAB — CBC
HCT: 22.8 % — ABNORMAL LOW (ref 39.0–52.0)
Hemoglobin: 7 g/dL — ABNORMAL LOW (ref 13.0–17.0)
MCH: 31.3 pg (ref 26.0–34.0)
MCHC: 30.7 g/dL (ref 30.0–36.0)
MCV: 101.8 fL — ABNORMAL HIGH (ref 80.0–100.0)
Platelets: 187 10*3/uL (ref 150–400)
RBC: 2.24 MIL/uL — ABNORMAL LOW (ref 4.22–5.81)
RDW: 15.3 % (ref 11.5–15.5)
WBC: 4.2 10*3/uL (ref 4.0–10.5)
nRBC: 0 % (ref 0.0–0.2)

## 2022-05-04 LAB — RENAL FUNCTION PANEL
Albumin: 3.2 g/dL — ABNORMAL LOW (ref 3.5–5.0)
Anion gap: 9 (ref 5–15)
BUN: 56 mg/dL — ABNORMAL HIGH (ref 6–20)
CO2: 18 mmol/L — ABNORMAL LOW (ref 22–32)
Calcium: 7.9 mg/dL — ABNORMAL LOW (ref 8.9–10.3)
Chloride: 108 mmol/L (ref 98–111)
Creatinine, Ser: 9.59 mg/dL — ABNORMAL HIGH (ref 0.61–1.24)
GFR, Estimated: 6 mL/min — ABNORMAL LOW (ref >60)
Glucose, Bld: 106 mg/dL — ABNORMAL HIGH (ref 70–99)
Phosphorus: 5.4 mg/dL — ABNORMAL HIGH (ref 2.5–4.6)
Potassium: 5 mmol/L (ref 3.5–5.1)
Sodium: 135 mmol/L (ref 135–145)

## 2022-05-04 LAB — RESP PANEL BY RT-PCR (RSV, FLU A&B, COVID)  RVPGX2
Influenza A by PCR: NEGATIVE
Influenza B by PCR: NEGATIVE
Resp Syncytial Virus by PCR: NEGATIVE
SARS Coronavirus 2 by RT PCR: NEGATIVE

## 2022-05-04 LAB — ABO/RH: ABO/RH(D): B POS

## 2022-05-04 LAB — TROPONIN I (HIGH SENSITIVITY)
Troponin I (High Sensitivity): 221 ng/L (ref <18)
Troponin I (High Sensitivity): 220 ng/L (ref <18)

## 2022-05-04 LAB — BRAIN NATRIURETIC PEPTIDE: B Natriuretic Peptide: 2226.4 pg/mL — ABNORMAL HIGH (ref 0.0–100.0)

## 2022-05-04 LAB — PREPARE RBC (CROSSMATCH)

## 2022-05-04 MED ORDER — SODIUM CHLORIDE 0.9% IV SOLUTION: Freq: Once | INTRAVENOUS | Status: DC

## 2022-05-04 MED ORDER — SODIUM BICARBONATE 650 MG PO TABS
650.0000 mg | ORAL_TABLET | Freq: Two times a day (BID) | ORAL | Status: DC
  Administered 2022-05-04 – 2022-05-12 (×16): 650 mg via ORAL
  Filled 2022-05-04 (×16): qty 1

## 2022-05-04 MED ORDER — HYDRALAZINE HCL 50 MG PO TABS
100.0000 mg | ORAL_TABLET | Freq: Three times a day (TID) | ORAL | Status: DC
  Administered 2022-05-04 – 2022-05-05 (×4): 100 mg via ORAL
  Filled 2022-05-04 (×4): qty 2

## 2022-05-04 MED ORDER — OXYCODONE HCL 5 MG PO TABS
5.0000 mg | ORAL_TABLET | Freq: Four times a day (QID) | ORAL | Status: DC | PRN
  Administered 2022-05-06 – 2022-05-10 (×5): 5 mg via ORAL
  Filled 2022-05-04 (×6): qty 1

## 2022-05-04 MED ORDER — CLONIDINE HCL 0.1 MG PO TABS
0.1000 mg | ORAL_TABLET | Freq: Three times a day (TID) | ORAL | Status: DC
  Administered 2022-05-04 – 2022-05-05 (×2): 0.1 mg via ORAL
  Filled 2022-05-04 (×2): qty 1

## 2022-05-04 MED ORDER — OXYCODONE-ACETAMINOPHEN 5-325 MG PO TABS
1.0000 | ORAL_TABLET | Freq: Four times a day (QID) | ORAL | Status: DC | PRN
  Administered 2022-05-06 – 2022-05-10 (×5): 1 via ORAL
  Filled 2022-05-04 (×6): qty 1

## 2022-05-04 MED ORDER — OXYCODONE-ACETAMINOPHEN 10-325 MG PO TABS: 1.0000 | ORAL_TABLET | Freq: Four times a day (QID) | ORAL | Status: DC | PRN

## 2022-05-04 MED ORDER — ASPIRIN 81 MG PO CHEW
324.0000 mg | CHEWABLE_TABLET | Freq: Once | ORAL | Status: AC
  Administered 2022-05-04: 324 mg via ORAL
  Filled 2022-05-04: qty 4

## 2022-05-04 MED ORDER — FUROSEMIDE 10 MG/ML IJ SOLN
80.0000 mg | Freq: Once | INTRAMUSCULAR | Status: AC
  Administered 2022-05-04: 80 mg via INTRAVENOUS
  Filled 2022-05-04 (×2): qty 8

## 2022-05-04 MED ORDER — PANTOPRAZOLE SODIUM 40 MG IV SOLR
40.0000 mg | INTRAVENOUS | Status: DC
  Administered 2022-05-04 – 2022-05-07 (×4): 40 mg via INTRAVENOUS
  Filled 2022-05-04 (×4): qty 10

## 2022-05-04 MED ORDER — LORATADINE 10 MG PO TABS
10.0000 mg | ORAL_TABLET | Freq: Every day | ORAL | Status: DC
  Administered 2022-05-05 – 2022-05-12 (×8): 10 mg via ORAL
  Filled 2022-05-04 (×8): qty 1

## 2022-05-04 MED ORDER — HYDROMORPHONE HCL 1 MG/ML IJ SOLN
0.5000 mg | INTRAMUSCULAR | Status: DC | PRN
  Administered 2022-05-04 – 2022-05-11 (×18): 0.5 mg via INTRAVENOUS
  Filled 2022-05-04 (×12): qty 0.5
  Filled 2022-05-04: qty 1
  Filled 2022-05-04 (×5): qty 0.5

## 2022-05-04 MED ORDER — ASPIRIN 81 MG PO TBEC
81.0000 mg | DELAYED_RELEASE_TABLET | Freq: Every evening | ORAL | Status: DC
  Administered 2022-05-05 – 2022-05-11 (×7): 81 mg via ORAL
  Filled 2022-05-04 (×7): qty 1

## 2022-05-04 MED ORDER — ACETAMINOPHEN 325 MG PO TABS: 650.0000 mg | ORAL_TABLET | ORAL | Status: DC | PRN

## 2022-05-04 MED ORDER — FLUTICASONE PROPIONATE 50 MCG/ACT NA SUSP: 2.0000 | Freq: Every day | NASAL | Status: DC | PRN

## 2022-05-04 MED ORDER — SODIUM ZIRCONIUM CYCLOSILICATE 10 G PO PACK
10.0000 g | PACK | Freq: Once | ORAL | Status: AC
  Administered 2022-05-04: 10 g via ORAL
  Filled 2022-05-04: qty 1

## 2022-05-04 MED ORDER — NITROGLYCERIN 0.4 MG SL SUBL
0.4000 mg | SUBLINGUAL_TABLET | SUBLINGUAL | Status: DC | PRN
  Administered 2022-05-06: 0.4 mg via SUBLINGUAL
  Filled 2022-05-04: qty 1

## 2022-05-04 NOTE — Consult Note (Addendum)
Reason for Consult: CKD stage V Referring Physician: Regenia Skeeter, MD  Cody Webster is an 56 y.o. 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  He also reports decreased appetite and feeling very cold and weak.  He was last seen by Dr. Moshe Cipro over 6 months ago and has a history on noncompliance with follow up which is why he was taken off of the transplant list at [facility].  He has a LUE AVF which is patent, however he reports that he would rather die than go on dialysis at this time.  The trend in Scr is seen below.    Trend in Creatinine: Creatinine, Ser  Date/Time Value Ref Range Status  05/04/2022 01:19 PM 9.84 (H) 0.61 - 1.24 mg/dL Final  04/07/2021 11:27 AM 9.12 (H) 0.76 - 1.27 mg/dL Final  04/23/2020 09:55 AM 7.32 (H) 0.76 - 1.27 mg/dL Final  03/23/2020 07:32 AM 9.20 (H) 0.61 - 1.24 mg/dL Final  10/03/2019 08:13 PM 8.04 (H) 0.61 - 1.24 mg/dL Final  07/22/2017 01:19 AM 4.89 (H) 0.61 - 1.24 mg/dL Final  07/21/2017 04:35 PM 4.79 (H) 0.61 - 1.24 mg/dL Final  07/21/2017 05:43 AM 4.93 (H) 0.61 - 1.24 mg/dL Final  07/20/2017 11:17 AM 5.50 (H) 0.61 - 1.24 mg/dL Final  07/20/2017 11:09 AM 5.18 (H) 0.61 - 1.24 mg/dL Final  04/27/2016 05:24 AM 3.02 (H) 0.61 - 1.24 mg/dL Final  04/26/2016 05:31 AM 3.71 (H) 0.61 - 1.24 mg/dL Final  04/25/2016 05:18 AM 3.85 (H) 0.61 - 1.24 mg/dL Final  04/24/2016 05:18 AM 4.04 (H) 0.61 - 1.24 mg/dL Final  04/23/2016 03:00 PM 4.24 (H) 0.61 - 1.24 mg/dL Final  01/12/2015 05:20 AM 2.65 (H) 0.61 - 1.24 mg/dL Final  01/12/2015 03:50 AM 2.63 (H) 0.61 - 1.24 mg/dL Final  01/11/2015 05:11 PM 3.00 (H) 0.61 - 1.24 mg/dL Final  01/11/2015 03:40 PM 3.66 (H) 0.61 -  1.24 mg/dL Final  09/26/2014 03:40 AM 3.68 (H) 0.61 - 1.24 mg/dL Final  09/25/2014 08:22 AM 3.97 (H) 0.61 - 1.24 mg/dL Final  09/09/2014 05:40 AM 2.97 (H) 0.50 - 1.35 mg/dL Final  09/07/2014 05:21 AM 3.38 (H) 0.50 - 1.35 mg/dL Final  09/06/2014 05:36 AM 3.19 (H) 0.50 - 1.35 mg/dL Final  09/05/2014 11:36 PM 3.47 (H) 0.50 - 1.35 mg/dL Final  07/12/2014 05:16 AM 2.54 (H) 0.50 - 1.35 mg/dL Final  07/11/2014 04:12 PM 2.66 (H) 0.50 - 1.35 mg/dL Final  12/13/2013 05:00 AM 2.04 (H) 0.50 - 1.35 mg/dL Final  12/12/2013 04:55 AM 2.06 (H) 0.50 - 1.35 mg/dL Final  12/11/2013 03:58 AM 2.04 (H) 0.50 - 1.35 mg/dL Final  12/10/2013 03:44 PM 2.33 (H) 0.50 - 1.35 mg/dL Final    PMH:   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   Depression    Diabetes mellitus without complication ([place])    type 2   GERD (gastroesophageal reflux disease)    Hypertension    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 ([place]) 2015  EF now improved back to normal mildly reduced   Peripheral neuropathy    Pneumonia    PTSD (post-traumatic stress disorder)    Seizures ([place])    27 years ago    PSH:   Past Surgical History:  Procedure Laterality Date   AV FISTULA PLACEMENT Left 11/08/2016   Procedure: ARTERIOVENOUS (AV) FISTULA CREATION;  Surgeon: Angelia Mould, MD;  Location: [place];  Service: Vascular;  Laterality: Left;   AV FISTULA PLACEMENT Left 03/23/2020   Procedure: LEFT BASILIC VEIN FISTULA CREATION;  Surgeon: Angelia Mould, MD;  Location: [place];  Service: Vascular;  Laterality: Left;   NM MYOVIEW LTD  07/2017   "Moderate sized moderate severity" (on cardiology was very small size, small intensity) partially reversible inferoapical//inferoseptal defect.  (Read as intermediate-high risk) -> over read by cardiology as [place]  09/2019    [facility] Small sized, mild severity reversible defect involving apical lateral & mid anterolateral wall thought to be artifacts - but CRO mild ischemia.  EF 55%. => Read as LOW RISK:   right foot surgery     TOE AMPUTATION     TRANSTHORACIC ECHOCARDIOGRAM  07/2017   EF 60%.,  Moderate LVH.  GR 1 DD.  No R WMA.  Mild RV and moderate RA dilation.   TRANSTHORACIC ECHOCARDIOGRAM  10/03/2019   [facility]: EF 40%, mild MR, mild aortic calcification    Allergies:  Allergies  Allergen Reactions   Morphine And Related Shortness Of Breath and Other (See Comments)    Hallucination  Tolerates Norco/Vicodin    Medications:   Prior to Admission medications   Medication Sig Start Date End Date Taking? Authorizing Provider  acetaminophen (TYLENOL) 500 MG tablet Take 1,000 mg by mouth every 6 (six) hours as needed (for pain/headaches.).   Yes [provider]  cetirizine (ZYRTEC) 10 MG tablet TAKE 1 TABLET (10 MG TOTAL) BY MOUTH DAILY. 07/27/21  Yes Kerin Perna, NP  cloNIDine (CATAPRES) 0.1 MG tablet TAKE ONE (1) TABLET BY MOUTH THREE TIMES DAILY. Patient taking differently: Take 0.1 mg by mouth 3 (three) times daily. 04/01/22  Yes Kerin Perna, NP  fluticasone (FLONASE) 50 MCG/ACT nasal spray Place 2 sprays into both nostrils daily. Patient taking differently: Place 2 sprays into both nostrils daily as needed for allergies. 08/25/20  Yes Kerin Perna, NP  gabapentin (NEURONTIN) 300 MG capsule Take 1 capsule (300 mg total) by mouth 2 (two) times daily. Patient taking differently: Take 300 mg by mouth at bedtime. 04/21/22  Yes Newt Minion, MD  guaiFENesin (MUCINEX) 600 MG 12 hr tablet Take 1 tablet (600 mg total) by mouth 2 (two) times daily as needed for to loosen phlegm. 10/01/20  Yes Kerin Perna, NP  oxyCODONE-acetaminophen (PERCOCET) 10-325 MG tablet Take 1 tablet by mouth every 6 (six) hours as needed for pain. 03/02/22 05/31/22 Yes [provider]  traZODone (DESYREL) 100 MG tablet TAKE 1/2 TO 1 TABLET BY MOUTH AT BEDTIME AS NEEDED FOR SLEEP. Patient taking differently: Take 50-100 mg by mouth at bedtime as needed for sleep. 10/27/21  Yes Kerin Perna, NP  aspirin EC 81 MG tablet Take 81 mg by mouth every evening. Patient not taking: Reported on 03/29/2022    [provider]  diclofenac Sodium (VOLTAREN) 1 % GEL Apply 4 g topically 4 (four) times daily. Patient not taking: Reported on 03/29/2022 05/24/21   Kerin Perna, NP  hydrALAZINE (  APRESOLINE) 50 MG tablet TAKE 2 TABLETS (100 MG TOTAL) BY MOUTH 3 (THREE) TIMES DAILY. Patient not taking: Reported on 03/29/2022 12/14/17   Leonie Man, MD  isoniazid (NYDRAZID) 300 MG tablet Take 1 tablet (300 mg total) by mouth daily. Patient not taking: Reported on 05/04/2022 03/29/22   Thayer Headings, MD  nitroGLYCERIN (NITROSTAT) 0.4 MG SL tablet Place 1 tablet (0.4 mg total) under the tongue every 5 (five) minutes as needed for chest pain (CP or SOB). 07/22/17   Florencia Reasons, MD  pyridOXINE (VITAMIN B6) 50 MG tablet Take 1 tablet (50 mg total) by mouth daily. Patient not taking: Reported on 05/04/2022 03/29/22   Thayer Headings, MD    Inpatient medications:   Discontinued Meds:   Medications Discontinued During This Encounter  Medication Reason   hydrOXYzine (ATARAX) 10 MG tablet Patient Preference   Nutritional Supplements (GLUCERNA 1.0 CAL/CARBSTEADY) LIQD Patient Preference    Social History:  reports that he has been smoking cigarettes. He has a 15.00 pack-year smoking history. He quit smokeless tobacco use about 38 years ago.  His smokeless tobacco use included snuff and chew. He reports that he does not currently use alcohol. He reports that he does not use drugs.  Family History:   Family History  Problem Relation Age of Onset   CAD Mother    Heart attack Mother    Diabetes type II Father    Stroke Father    CAD Father    Hypertension Sister    CAD Sister     Diabetes type II Brother    Colon cancer Maternal Uncle        2015   Colon polyps Neg Hx    Esophageal cancer Neg Hx    Stomach cancer Neg Hx    Rectal cancer Neg Hx     Pertinent items are noted in HPI. Weight change:  No intake or output data in the 24 hours ending 05/04/22 1554 BP (!) 160/74   Pulse 76   Temp 97.9 F (36.6 C) (Oral)   Resp 16   Ht '6\' 1"'$  (1.854 m)   Wt 81.6 kg   SpO2 100%   BMI 23.75 kg/m  Vitals:   05/04/22 1235 05/04/22 1443 05/04/22 1500 05/04/22 1520  BP:  (!) 154/71 (!) 142/84 (!) 160/74  Pulse:  64 69 76  Resp:  '15 14 16  '$ Temp:  97.9 F (36.6 C)    TempSrc:  Oral    SpO2:  98% 99% 100%  Weight: 81.6 kg     Height: '6\' 1"'$  (1.854 m)        General appearance: fatigued and no distress Head: Normocephalic, without obvious abnormality, atraumatic Resp: rhonchi bilaterally Cardio: regular rate and rhythm, S1, S2 normal, no murmur, click, rub or gallop GI: soft, non-tender; bowel sounds normal; no masses,  no organomegaly Extremities: extremities normal, atraumatic, no cyanosis or edema and LUE AVF +T/B  Labs: Basic Metabolic Panel: Recent Labs  Lab 05/04/22 1319  NA 137  K 5.5*  CL 109  CO2 20*  GLUCOSE 83  BUN 54*  CREATININE 9.84*  CALCIUM 8.3*   Liver Function Tests: No results for input(s): "AST", "ALT", "ALKPHOS", "BILITOT", "PROT", "ALBUMIN" in the last 168 hours. No results for input(s): "LIPASE", "AMYLASE" in the last 168 hours. No results for input(s): "AMMONIA" in the last 168 hours. CBC: Recent Labs  Lab 05/04/22 1319  WBC 4.2  HGB 7.0*  HCT 22.8*  MCV 101.8*  PLT 187   PT/INR: '@LABRCNTIP'$ (inr:5) Cardiac Enzymes: )No results for input(s): "CKTOTAL", "CKMB", "CKMBINDEX", "TROPONINI" in the last 168 hours. CBG: No results for input(s): "GLUCAP" in the last 168 hours.  Iron Studies: No results for input(s): "IRON", "TIBC", "TRANSFERRIN", "FERRITIN" in the last 168 hours.  Xrays/Other Studies: DG Chest 2  View  Result Date: 05/04/2022 CLINICAL DATA:  cp EXAM: CHEST - 2 VIEW COMPARISON:  03/29/2012 FINDINGS: Cardiac silhouette enlarged. No evidence of pneumothorax or pleural effusion. No evidence of pulmonary edema. Aorta is calcified. No osseous abnormalities identified. IMPRESSION: Enlarged cardiac silhouette. Electronically Signed   By: Sammie Bench M.D.   On: 05/04/2022 13:19     Assessment/Plan:  CKD Stage V - presenting with some mild uremic symptoms.  He has a functional LUE AVF, however he refuses to initiate dialysis at this time and states that he would rather die than go on dialysis.  Will continue to educate.    Avoid nephrotoxic medications including NSAIDs and iodinated intravenous contrast exposure unless the latter is absolutely indicated.   Preferred narcotic agents for pain control are hydromorphone, fentanyl, and methadone. Morphine should not be used.  Avoid Baclofen and avoid oral sodium phosphate and magnesium citrate based laxatives / bowel preps.  Continue strict Input and Output monitoring.  Will monitor the patient closely with you and intervene or adjust therapy as indicated by changes in clinical status/labs  Chest pain - likely due to demand ischemia from profound anemia, however he has many risk factors for CAD.  Recommend cardiology evaluation, however this will be limited if he refuses dialysis. Hyperkalemia - due to CKD.  Start Lokelma 10 grams daily and start renal diet. Anemia of CKD stage V - agree with blood transfusion and will check iron stores and start ESA Chronic combined systolic and diastolic CHF - BNP 7619.  Would give IV lasix 80 mg after blood transfusion. Metabolic acidosis - will start oral sodium bicarb HTN - poorly controlled.  Currently only taking clonidine 0.1 mg tid but had been on carvedilol, hydralazine, and imdur in the past. CKD-MBD - will check phos and iPTH levels as well as vit D. Tobacco abuse - recommend cessation. Latent TB -  refusing medication and pulmonary follow up.  Chronic back pain - continue with percocet per primary.   Governor Rooks [employer] 05/04/2022, 3:54 PM

## 2022-05-04 NOTE — ED Triage Notes (Signed)
2 weeks chest pain left side, hard to breath and fatigue, decreased appetite.

## 2022-05-04 NOTE — Telephone Encounter (Signed)
  Chief Complaint: chest pain Symptoms: neck, jaw, back, arm pain, difficulty breathing, sweating and dizziness Frequency: 2 weeks Pertinent Negatives: Patient denies nausea, vomiting, fever, cough Disposition: '[x]'$ ED /'[]'$ Urgent Care (no appt availability in office) / '[]'$ Appointment(In office/virtual)/ '[]'$  [facility]/ '[]'$ Home Care/ '[]'$ Refused Recommended Disposition /'[]'$ [place] [facility]/ '[]'$  Follow-up with PCP Additional Notes: advised to go to ED now. Pt reluctant to go to ED- Family member stated she will take him- refused 911 Reason for Disposition  [1] Chest pain lasts > 5 minutes AND [2] history of heart disease (i.e., angina, heart attack, heart failure, bypass surgery, takes nitroglycerin)  Answer Assessment - Initial Assessment Questions 1. LOCATION: "Where does it hurt?"       Left side of chest 2. RADIATION: "Does the pain go anywhere else?" (e.g., into neck, jaw, arms, back)     Neck, jaw, arms , back "pain is going everywhere" 3. ONSET: "When did the chest pain begin?" (Minutes, hours or days)      2 weeks 4. PATTERN: "Does the pain come and go, or has it been constant since it started?"  "Does it get worse with exertion?"      Skipped question due to emergent assessment and disposition 5. DURATION: "How long does it last" (e.g., seconds, minutes, hours)     N/a 6. SEVERITY: "How bad is the pain?"  (e.g., Scale 1-10; mild, moderate, or severe)    - MILD (1-3): doesn't interfere with normal activities     - MODERATE (4-7): interferes with normal activities or awakens from sleep    - SEVERE (8-10): excruciating pain, unable to do any normal activities       Moderate to severe 7. CARDIAC RISK FACTORS: "Do you have any history of heart problems or risk factors for heart disease?" (e.g., angina, prior heart attack; diabetes, high blood pressure, high cholesterol, smoker, or strong family history of heart disease)     HTN, Heart failure 8. PULMONARY RISK FACTORS: "Do you  have any history of lung disease?"  (e.g., blood clots in lung, asthma, emphysema, birth control pills)     Latent TB 9. CAUSE: "What do you think is causing the chest pain?"    covid 10. OTHER SYMPTOMS: "Do you have any other symptoms?" (e.g., dizziness, nausea, vomiting, sweating, fever, difficulty breathing, cough)       Difficulty breathing, sweating, dizziness 11. PREGNANCY: "Is there any chance you are pregnant?" "When was your last menstrual period?"       N/a  Protocols used: Chest Pain-A-AH

## 2022-05-04 NOTE — H&P (Signed)
History and Physical    Patient: Cody Webster IOE:[id number] DOB: 1966/02/26 DOA: 05/04/2022 DOS: the patient was seen and examined on 05/04/2022 PCP: Kerin Perna, NP  Patient coming from: Home  Chief Complaint:  Chief Complaint  Patient presents with   Chest Pain   HPI: Cody Webster is a 56 y.o. male with medical history significant of CKD5, HTN, GERD, latent TB. Presenting with chest pain. He reports that he's had intermittent chest pain over the last 2 weeks. His pain is on his left side. It feels like a nagging ache; but when he lies down, he'll have pain that is sharp and 10/10. He feels like he has some congestion or "gas" around his heart. He hasn't had any cough, fevers, or night sweats. He hasn't had any N/V/D. He hasn't had any dark stools. He has not noticed any increased swelling in his legs. He reports that he was on medication for latent TB, but he stopped because he thought it was making him sick. When his symptoms did not improve today, he decided to come to the ED for evaluation. He denies any other aggravating or alleviating factors.   Review of Systems: As mentioned in the history of present illness. All other systems reviewed and are negative. 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 ([place]) 2015   EF now improved back to normal mildly reduced   Peripheral neuropathy    Pneumonia    PTSD (post-traumatic stress disorder)    Seizures ([place])    27 years ago   Past Surgical History:  Procedure Laterality Date   AV FISTULA PLACEMENT Left 11/08/2016   Procedure: ARTERIOVENOUS (AV) FISTULA CREATION;  Surgeon: Angelia Mould, MD;   Location: [place];  Service: Vascular;  Laterality: Left;   AV FISTULA PLACEMENT Left 03/23/2020   Procedure: LEFT BASILIC VEIN FISTULA CREATION;  Surgeon: Angelia Mould, MD;  Location: [facility] OR;  Service: Vascular;  Laterality: Left;   NM MYOVIEW LTD  07/2017   "Moderate sized moderate severity" (on cardiology was very small size, small intensity) partially reversible inferoapical//inferoseptal defect.  (Read as intermediate-high risk) -> over read by cardiology as [place]  09/2019   [facility] Small sized, mild severity reversible defect involving apical lateral & mid anterolateral wall thought to be artifacts - but CRO mild ischemia.  EF 55%. => Read as LOW RISK:   right foot surgery     TOE AMPUTATION     TRANSTHORACIC ECHOCARDIOGRAM  07/2017   EF 60%.,  Moderate LVH.  GR 1 DD.  No R WMA.  Mild RV and moderate RA dilation.   TRANSTHORACIC ECHOCARDIOGRAM  10/03/2019   [facility]: EF 40%, mild MR, mild aortic calcification   Social History:  reports that he has been smoking cigarettes. He has a 15.00 pack-year smoking history. He quit smokeless tobacco use about 38 years ago.  His smokeless tobacco use included snuff and chew. He reports that he does not currently use alcohol. He reports that he does not use drugs.  Allergies  Allergen Reactions   Morphine And Related  Shortness Of Breath and Other (See Comments)    Hallucination  Tolerates Norco/Vicodin    Family History  Problem Relation Age of Onset   CAD Mother    Heart attack Mother    Diabetes type II Father    Stroke Father    CAD Father    Hypertension Sister    CAD Sister    Diabetes type II Brother    Colon cancer Maternal Uncle        2015   Colon polyps Neg Hx    Esophageal cancer Neg Hx    Stomach cancer Neg Hx    Rectal cancer Neg Hx     Prior to Admission medications   Medication Sig Start Date End Date Taking? Authorizing Provider  acetaminophen (TYLENOL) 500 MG tablet Take 1,000  mg by mouth every 6 (six) hours as needed (for pain/headaches.).    [provider]  aspirin EC 81 MG tablet Take 81 mg by mouth every evening. Patient not taking: Reported on 03/29/2022    [provider]  cetirizine (ZYRTEC) 10 MG tablet TAKE 1 TABLET (10 MG TOTAL) BY MOUTH DAILY. 07/27/21   Kerin Perna, NP  cloNIDine (CATAPRES) 0.1 MG tablet TAKE ONE (1) TABLET BY MOUTH THREE TIMES DAILY. 04/01/22   Kerin Perna, NP  diclofenac Sodium (VOLTAREN) 1 % GEL Apply 4 g topically 4 (four) times daily. Patient not taking: Reported on 03/29/2022 05/24/21   Kerin Perna, NP  fluticasone [facility]) 50 MCG/ACT nasal spray Place 2 sprays into both nostrils daily. 08/25/20   Kerin Perna, NP  gabapentin (NEURONTIN) 300 MG capsule Take 1 capsule (300 mg total) by mouth 2 (two) times daily. 04/21/22   Newt Minion, MD  guaiFENesin (MUCINEX) 600 MG 12 hr tablet Take 1 tablet (600 mg total) by mouth 2 (two) times daily as needed for to loosen phlegm. 10/01/20   Kerin Perna, NP  hydrALAZINE (APRESOLINE) 50 MG tablet TAKE 2 TABLETS (100 MG TOTAL) BY MOUTH 3 (THREE) TIMES DAILY. Patient not taking: Reported on 03/29/2022 12/14/17   Leonie Man, MD  isoniazid (NYDRAZID) 300 MG tablet Take 1 tablet (300 mg total) by mouth daily. 03/29/22   Comer, Okey Regal, MD  nitroGLYCERIN (NITROSTAT) 0.4 MG SL tablet Place 1 tablet (0.4 mg total) under the tongue every 5 (five) minutes as needed for chest pain (CP or SOB). Patient not taking: Reported on 03/29/2022 07/22/17   Florencia Reasons, MD  oxyCODONE-acetaminophen [facility]) 10-325 MG tablet Take by mouth. 03/02/22 05/31/22  [provider]  pyridOXINE (VITAMIN B6) 50 MG tablet Take 1 tablet (50 mg total) by mouth daily. 03/29/22   Thayer Headings, MD  traZODone (DESYREL) 100 MG tablet TAKE 1/2 TO 1 TABLET BY MOUTH AT BEDTIME AS NEEDED FOR SLEEP. 10/27/21   Kerin Perna, NP    Physical Exam: Vitals:   05/04/22 1205  05/04/22 1235 05/04/22 1443  BP: (!) 154/74  (!) 154/71  Pulse: 68  64  Resp: 16  15  Temp: 97.9 F (36.6 C)  97.9 F (36.6 C)  TempSrc: Oral  Oral  SpO2: 100%  98%  Weight:  81.6 kg   Height:  '6\' 1"'$  (1.854 m)    General: 56 y.o. male resting in bed in NAD Eyes: PERRL, normal sclera ENMT: Nares patent w/o discharge, orophaynx clear, dentition normal, ears w/o discharge/lesions/ulcers Neck: Supple, trachea midline Cardiovascular: RRR, +S1, S2, no m/g/r, equal pulses throughout Respiratory: decreased at bases,  no w/r/r, normal WOB GI: BS+, NDNT, no masses noted, no organomegaly noted MSK: No e/c/c Neuro: A&O x 3, no focal deficits Psyc: Appropriate interaction and affect, calm/cooperative  Data Reviewed:  Results for orders placed or performed during the hospital encounter of 05/04/22 (from the past 24 hour(s))  Resp panel by RT-PCR (RSV, Flu A&B, Covid) Anterior Nasal Swab     Status: None   Collection Time: 05/04/22  1:02 PM   Specimen: Anterior Nasal Swab  Result Value Ref Range   SARS Coronavirus 2 by RT PCR NEGATIVE NEGATIVE   Influenza A by PCR NEGATIVE NEGATIVE   Influenza B by PCR NEGATIVE NEGATIVE   Resp Syncytial Virus by PCR NEGATIVE NEGATIVE  Brain natriuretic peptide     Status: Abnormal   Collection Time: 05/04/22  1:08 PM  Result Value Ref Range   B Natriuretic Peptide 2,226.4 (H) 0.0 - 100.0 pg/mL  Basic metabolic panel     Status: Abnormal   Collection Time: 05/04/22  1:19 PM  Result Value Ref Range   Sodium 137 135 - 145 mmol/L   Potassium 5.5 (H) 3.5 - 5.1 mmol/L   Chloride 109 98 - 111 mmol/L   CO2 20 (L) 22 - 32 mmol/L   Glucose, Bld 83 70 - 99 mg/dL   BUN 54 (H) 6 - 20 mg/dL   Creatinine, Ser 9.84 (H) 0.61 - 1.24 mg/dL   Calcium 8.3 (L) 8.9 - 10.3 mg/dL   GFR, Estimated 6 (L) >60 mL/min   Anion gap 8 5 - 15  CBC     Status: Abnormal   Collection Time: 05/04/22  1:19 PM  Result Value Ref Range   WBC 4.2 4.0 - 10.5 K/uL   RBC 2.24 (L) 4.22 -  5.81 MIL/uL   Hemoglobin 7.0 (L) 13.0 - 17.0 g/dL   HCT 22.8 (L) 39.0 - 52.0 %   MCV 101.8 (H) 80.0 - 100.0 fL   MCH 31.3 26.0 - 34.0 pg   MCHC 30.7 30.0 - 36.0 g/dL   RDW 15.3 11.5 - 15.5 %   Platelets 187 150 - 400 K/uL   nRBC 0.0 0.0 - 0.2 %  Troponin I (High Sensitivity)     Status: Abnormal   Collection Time: 05/04/22  1:19 PM  Result Value Ref Range   Troponin I (High Sensitivity) 221 (HH) <18 ng/L  Troponin I (High Sensitivity)     Status: Abnormal   Collection Time: 05/04/22  3:11 PM  Result Value Ref Range   Troponin I (High Sensitivity) 220 (HH) <18 ng/L   CXR: Enlarged cardiac silhouette.  EKG: sinus, no st elevations; prolonged QT noted  Assessment and Plan: Chest pain     - place in obs, tele     - trop 221-> 220     - EKG: sinus w/o st elevations     - BNP: 2226     - cards consulted, appreciate assistance; per EDP, rec'd holding on heparin for right now and checking echo     - no significant peripheral edema noted; holding lasix, see below  CKD5 Metabolic acidosis     - he has an elevated K+ and he's slightly acidotic     - he was once considered for renal transplant w/ [facility], but that never happened     - he is actively refusing dialysis; he has capacity and is able to tell me the consequence of refusing dialysis if he were determined to need it     -  I have consulted nephrology for assistance with diuresis if echo determines it is necessary; I appreciate their assistance     - renal diet  Latent TB     - he is refusing to continue his medication for this; he has capacity and is able to clearly voice the consequences of lack of treatment  Hyperkalemia     - lokelma     - rpt renal function panel at 2000hrs  Macrocytic anemia Symptomatic anemia?     - check iron studies     - transfuse 1 unit pRBCs; give '80mg'$  lasix IV x 1 after  Tobacco abuse     - counsel against further use  HTN     - continue home regimen when confirmed  Advance Care Planning:    Code Status: DNR  Consults: Cardiology, Nephrology  Family Communication: None at bedside  Severity of Illness: The appropriate patient status for this patient is OBSERVATION. Observation status is judged to be reasonable and necessary in order to provide the required intensity of service to ensure the patient's safety. The patient's presenting symptoms, physical exam findings, and initial radiographic and laboratory data in the context of their medical condition is felt to place them at decreased risk for further clinical deterioration. Furthermore, it is anticipated that the patient will be medically stable for discharge from the hospital within 2 midnights of admission.   Author: Jonnie Finner, DO 05/04/2022 3:13 PM  For on call review [email or web address].

## 2022-05-04 NOTE — ED Provider Notes (Signed)
[place] DEPT Provider Note   CSN: [id number] Arrival date & time: 05/04/22  1156     History  Chief Complaint  Patient presents with   Chest Pain    Cody Webster is a 56 y.o. male.  HPI 56 year old male presents with chest pain and shortness of breath.  He has been having chest pressure, shortness of breath, congestion for about 2 weeks.  Symptoms are constant.  The dyspnea is relieved at rest but happens when he tries to get up to walk.  It also gets worse when laying flat.  The chest pressure is pretty much constant though does also worsen with laying flat.  No significant cough.  He was on medicines for latent TB but stopped them a few weeks ago.  His symptoms started before she stopped these medicines.  He denies any current leg swelling that he feels like a couple weeks ago his legs were swollen.  He has CKD but is not yet on dialysis. Chest pain does not radiate.  Home Medications Prior to Admission medications   Medication Sig Start Date End Date Taking? Authorizing Provider  acetaminophen (TYLENOL) 500 MG tablet Take 1,000 mg by mouth every 6 (six) hours as needed (for pain/headaches.).   Yes [provider]  cetirizine (ZYRTEC) 10 MG tablet TAKE 1 TABLET (10 MG TOTAL) BY MOUTH DAILY. 07/27/21  Yes Kerin Perna, NP  cloNIDine (CATAPRES) 0.1 MG tablet TAKE ONE (1) TABLET BY MOUTH THREE TIMES DAILY. Patient taking differently: Take 0.1 mg by mouth 3 (three) times daily. 04/01/22  Yes Kerin Perna, NP  fluticasone (FLONASE) 50 MCG/ACT nasal spray Place 2 sprays into both nostrils daily. Patient taking differently: Place 2 sprays into both nostrils daily as needed for allergies. 08/25/20  Yes Kerin Perna, NP  gabapentin (NEURONTIN) 300 MG capsule Take 1 capsule (300 mg total) by mouth 2 (two) times daily. Patient taking differently: Take 300 mg by mouth at bedtime. 04/21/22  Yes Newt Minion, MD  guaiFENesin  (MUCINEX) 600 MG 12 hr tablet Take 1 tablet (600 mg total) by mouth 2 (two) times daily as needed for to loosen phlegm. 10/01/20  Yes Kerin Perna, NP  oxyCODONE-acetaminophen (PERCOCET) 10-325 MG tablet Take 1 tablet by mouth every 6 (six) hours as needed for pain. 03/02/22 05/31/22 Yes [provider]  traZODone (DESYREL) 100 MG tablet TAKE 1/2 TO 1 TABLET BY MOUTH AT BEDTIME AS NEEDED FOR SLEEP. Patient taking differently: Take 50-100 mg by mouth at bedtime as needed for sleep. 10/27/21  Yes Kerin Perna, NP  aspirin EC 81 MG tablet Take 81 mg by mouth every evening. Patient not taking: Reported on 03/29/2022    [provider]  diclofenac Sodium (VOLTAREN) 1 % GEL Apply 4 g topically 4 (four) times daily. Patient not taking: Reported on 03/29/2022 05/24/21   Kerin Perna, NP  hydrALAZINE (APRESOLINE) 50 MG tablet TAKE 2 TABLETS (100 MG TOTAL) BY MOUTH 3 (THREE) TIMES DAILY. Patient not taking: Reported on 03/29/2022 12/14/17   Leonie Man, MD  isoniazid (NYDRAZID) 300 MG tablet Take 1 tablet (300 mg total) by mouth daily. Patient not taking: Reported on 05/04/2022 03/29/22   Thayer Headings, MD  nitroGLYCERIN (NITROSTAT) 0.4 MG SL tablet Place 1 tablet (0.4 mg total) under the tongue every 5 (five) minutes as needed for chest pain (CP or SOB). 07/22/17   Florencia Reasons, MD  pyridOXINE (VITAMIN B6) 50 MG tablet Take 1  tablet (50 mg total) by mouth daily. Patient not taking: Reported on 05/04/2022 03/29/22   Thayer Headings, MD      Allergies    Morphine and related    Review of Systems   Review of Systems  Constitutional:  Negative for fever.  HENT:  Positive for congestion.   Respiratory:  Positive for shortness of breath. Negative for cough.   Cardiovascular:  Positive for chest pain and leg swelling.  Gastrointestinal:  Negative for abdominal pain.    Physical Exam Updated Vital Signs BP (!) 160/74   Pulse 76   Temp 97.9 F (36.6 C) (Oral)   Resp  16   Ht '6\' 1"'$  (1.854 m)   Wt 81.6 kg   SpO2 100%   BMI 23.75 kg/m  Physical Exam Vitals and nursing note reviewed.  Constitutional:      General: He is not in acute distress.    Appearance: He is well-developed. He is not ill-appearing or diaphoretic.  HENT:     Head: Normocephalic and atraumatic.  Cardiovascular:     Rate and Rhythm: Normal rate and regular rhythm.     Heart sounds: Normal heart sounds.  Pulmonary:     Effort: Pulmonary effort is normal.     Breath sounds: Normal breath sounds.  Abdominal:     Palpations: Abdomen is soft.     Tenderness: There is no abdominal tenderness.  Musculoskeletal:     Right lower leg: No edema.     Left lower leg: No edema.  Skin:    General: Skin is warm and dry.  Neurological:     Mental Status: He is alert.     ED Results / Procedures / Treatments   Labs (all labs ordered are listed, but only abnormal results are displayed) Labs Reviewed  BASIC METABOLIC PANEL - Abnormal; Notable for the following components:      Result Value   Potassium 5.5 (*)    CO2 20 (*)    BUN 54 (*)    Creatinine, Ser 9.84 (*)    Calcium 8.3 (*)    GFR, Estimated 6 (*)    All other components within normal limits  CBC - Abnormal; Notable for the following components:   RBC 2.24 (*)    Hemoglobin 7.0 (*)    HCT 22.8 (*)    MCV 101.8 (*)    All other components within normal limits  BRAIN NATRIURETIC PEPTIDE - Abnormal; Notable for the following components:   B Natriuretic Peptide 2,226.4 (*)    All other components within normal limits  TROPONIN I (HIGH SENSITIVITY) - Abnormal; Notable for the following components:   Troponin I (High Sensitivity) 221 (*)    All other components within normal limits  RESP PANEL BY RT-PCR (RSV, FLU A&B, COVID)  [id number]  TROPONIN I (HIGH SENSITIVITY)    EKG EKG Interpretation  Date/Time:  Wednesday May 04 2022 12:51:53 EST Ventricular Rate:  64 PR Interval:  165 QRS Duration: 101 QT  Interval:  489 QTC Calculation: 505 R Axis:   57 Text Interpretation: Sinus rhythm Borderline low voltage, extremity leads Prolonged QT interval nonspecific changes similar to May 2021 Confirmed by Sherwood Gambler [phone]) on 05/04/2022 2:08:23 PM  Radiology DG Chest 2 View  Result Date: 05/04/2022 CLINICAL DATA:  cp EXAM: CHEST - 2 VIEW COMPARISON:  03/29/2012 FINDINGS: Cardiac silhouette enlarged. No evidence of pneumothorax or pleural effusion. No evidence of pulmonary edema. Aorta is calcified. No osseous abnormalities identified. IMPRESSION: Enlarged  cardiac silhouette. Electronically Signed   By: Sammie Bench M.D.   On: 05/04/2022 13:19    Procedures Ultrasound ED Echo  Date/Time: 05/04/2022 2:45 PM  Performed by: Sherwood Gambler, MD Authorized by: Sherwood Gambler, MD   Procedure details:    Indications: chest pain and dyspnea     Views: subxiphoid     Images: archived   Findings:    Pericardium: small pericardial effusion     LV Function comment:  Appears to have depressed EF on my interpretation   RV Diameter: normal   Impression:    Impression comment:  Small pericardial effusion, no tamponade. probably depressed systolic function     Medications Ordered in ED Medications  aspirin chewable tablet 324 mg (324 mg Oral Given 05/04/22 1457)    ED Course/ Medical Decision Making/ A&P                           Medical Decision Making Amount and/or Complexity of Data Reviewed Labs: ordered.    Details: Hemoglobin of 7.0, mildly lower than baseline from a couple years ago.  Creatinine of 9.8 is near baseline.  Slight hyperkalemia at 5.5.  Troponin of 220 is higher than baseline, repeat pending.  BNP consistent with CHF. Radiology: independent interpretation performed.    Details: Cardiomegaly, but no overt edema ECG/medicine tests: independent interpretation performed.    Details: No acute change from baseline.   Risk OTC drugs. Decision regarding  hospitalization.   Patient presents with multiple weeks of chest pain.  The shortness of breath itself is primarily exertional or with laying flat.  The chest pain does seem to worsen with laying flat as well.  Overall I suspect this is likely mostly due to CHF.  He states that he is not on dialysis currently though they did make an AV fistula.  He is nondistressed.  He has no lower extremity edema but again I suspect fluid overload is at least contributing.  His troponin is more likely to be from kidney failure, anemia, CHF rather than ACS.  I did discuss with Dr. Irish Lack.  For now advises to hold off on heparin and cardiology will consult.  Discussed with hospitalist, Dr. Marylyn Ishihara for admission.        Final Clinical Impression(s) / ED Diagnoses Final diagnoses:  Nonspecific chest pain    Rx / DC Orders ED Discharge Orders     None         Sherwood Gambler, MD 05/04/22 1609

## 2022-05-04 NOTE — ED Provider Triage Note (Signed)
Emergency Medicine Provider Triage Evaluation Note  Cody Webster , a 56 y.o. male  was evaluated in triage.  Pt complains of left-sided chest pain.  Has been present for 2 weeks.  Has not changed significantly during that time.  Does sometimes radiate to the right side of the chest as well.  Has associated shortness of breath.  Has also had decreased appetite.  States he just does not want to eat.  Complains of rhinorrhea.  Complains of subjective fevers.  Denies congestion, sore throat, cough.  Review of Systems  Positive: As above Negative: As above  Physical Exam  BP (!) 154/74   Pulse 68   Temp 97.9 F (36.6 C) (Oral)   Resp 16   Ht '6\' 1"'$  (1.854 m)   Wt 81.6 kg   SpO2 100%   BMI 23.75 kg/m  Gen:   Awake, no distress   Resp:  Normal effort  MSK:   Moves extremities without difficulty  Other:  Heart regular rate and rhythm  Medical Decision Making  Medically screening exam initiated at 1:01 PM.  Appropriate orders placed.  Cody Webster was informed that the remainder of the evaluation will be completed by another provider, this initial triage assessment does not replace that evaluation, and the importance of remaining in the ED until their evaluation is complete.  ACS rule out, respiratory panel   Roylene Reason, PA-C 05/04/22 1302

## 2022-05-05 ENCOUNTER — Observation Stay (HOSPITAL_COMMUNITY): Payer: Medicaid Other

## 2022-05-05 ENCOUNTER — Encounter (HOSPITAL_COMMUNITY): Payer: Self-pay | Admitting: Internal Medicine

## 2022-05-05 ENCOUNTER — Inpatient Hospital Stay (HOSPITAL_COMMUNITY): Payer: Medicaid Other

## 2022-05-05 DIAGNOSIS — I5042 Chronic combined systolic (congestive) and diastolic (congestive) heart failure: Secondary | ICD-10-CM | POA: Diagnosis not present

## 2022-05-05 DIAGNOSIS — I5031 Acute diastolic (congestive) heart failure: Secondary | ICD-10-CM | POA: Diagnosis not present

## 2022-05-05 DIAGNOSIS — I132 Hypertensive heart and chronic kidney disease with heart failure and with stage 5 chronic kidney disease, or end stage renal disease: Secondary | ICD-10-CM | POA: Diagnosis not present

## 2022-05-05 DIAGNOSIS — F17210 Nicotine dependence, cigarettes, uncomplicated: Secondary | ICD-10-CM | POA: Diagnosis not present

## 2022-05-05 DIAGNOSIS — Z7189 Other specified counseling: Secondary | ICD-10-CM | POA: Diagnosis not present

## 2022-05-05 DIAGNOSIS — D631 Anemia in chronic kidney disease: Secondary | ICD-10-CM | POA: Diagnosis not present

## 2022-05-05 DIAGNOSIS — Z992 Dependence on renal dialysis: Secondary | ICD-10-CM | POA: Diagnosis not present

## 2022-05-05 DIAGNOSIS — R29704 NIHSS score 4: Secondary | ICD-10-CM | POA: Diagnosis not present

## 2022-05-05 DIAGNOSIS — I25118 Atherosclerotic heart disease of native coronary artery with other forms of angina pectoris: Secondary | ICD-10-CM | POA: Diagnosis not present

## 2022-05-05 DIAGNOSIS — R0602 Shortness of breath: Secondary | ICD-10-CM | POA: Diagnosis not present

## 2022-05-05 DIAGNOSIS — I429 Cardiomyopathy, unspecified: Secondary | ICD-10-CM | POA: Diagnosis not present

## 2022-05-05 DIAGNOSIS — R627 Adult failure to thrive: Secondary | ICD-10-CM | POA: Diagnosis not present

## 2022-05-05 DIAGNOSIS — I5021 Acute systolic (congestive) heart failure: Secondary | ICD-10-CM | POA: Diagnosis not present

## 2022-05-05 DIAGNOSIS — I5043 Acute on chronic combined systolic (congestive) and diastolic (congestive) heart failure: Secondary | ICD-10-CM | POA: Diagnosis not present

## 2022-05-05 DIAGNOSIS — R4781 Slurred speech: Secondary | ICD-10-CM | POA: Diagnosis not present

## 2022-05-05 DIAGNOSIS — Z6821 Body mass index (BMI) 21.0-21.9, adult: Secondary | ICD-10-CM | POA: Diagnosis not present

## 2022-05-05 DIAGNOSIS — I272 Pulmonary hypertension, unspecified: Secondary | ICD-10-CM | POA: Diagnosis not present

## 2022-05-05 DIAGNOSIS — R072 Precordial pain: Secondary | ICD-10-CM | POA: Diagnosis not present

## 2022-05-05 DIAGNOSIS — N185 Chronic kidney disease, stage 5: Secondary | ICD-10-CM | POA: Diagnosis not present

## 2022-05-05 DIAGNOSIS — I200 Unstable angina: Secondary | ICD-10-CM | POA: Diagnosis not present

## 2022-05-05 DIAGNOSIS — I6381 Other cerebral infarction due to occlusion or stenosis of small artery: Secondary | ICD-10-CM | POA: Diagnosis not present

## 2022-05-05 DIAGNOSIS — Z79899 Other long term (current) drug therapy: Secondary | ICD-10-CM | POA: Diagnosis not present

## 2022-05-05 DIAGNOSIS — I5041 Acute combined systolic (congestive) and diastolic (congestive) heart failure: Secondary | ICD-10-CM | POA: Diagnosis not present

## 2022-05-05 DIAGNOSIS — I6302 Cerebral infarction due to thrombosis of basilar artery: Secondary | ICD-10-CM | POA: Diagnosis not present

## 2022-05-05 DIAGNOSIS — I998 Other disorder of circulatory system: Secondary | ICD-10-CM | POA: Diagnosis not present

## 2022-05-05 DIAGNOSIS — Z227 Latent tuberculosis: Secondary | ICD-10-CM | POA: Diagnosis not present

## 2022-05-05 DIAGNOSIS — I10 Essential (primary) hypertension: Secondary | ICD-10-CM | POA: Diagnosis not present

## 2022-05-05 DIAGNOSIS — I25110 Atherosclerotic heart disease of native coronary artery with unstable angina pectoris: Secondary | ICD-10-CM | POA: Diagnosis not present

## 2022-05-05 DIAGNOSIS — I2489 Other forms of acute ischemic heart disease: Secondary | ICD-10-CM | POA: Diagnosis not present

## 2022-05-05 DIAGNOSIS — E1122 Type 2 diabetes mellitus with diabetic chronic kidney disease: Secondary | ICD-10-CM | POA: Diagnosis not present

## 2022-05-05 DIAGNOSIS — I110 Hypertensive heart disease with heart failure: Secondary | ICD-10-CM | POA: Diagnosis not present

## 2022-05-05 DIAGNOSIS — R079 Chest pain, unspecified: Secondary | ICD-10-CM | POA: Diagnosis not present

## 2022-05-05 DIAGNOSIS — D649 Anemia, unspecified: Secondary | ICD-10-CM | POA: Diagnosis not present

## 2022-05-05 DIAGNOSIS — N186 End stage renal disease: Secondary | ICD-10-CM | POA: Diagnosis not present

## 2022-05-05 DIAGNOSIS — I509 Heart failure, unspecified: Secondary | ICD-10-CM | POA: Diagnosis not present

## 2022-05-05 DIAGNOSIS — I639 Cerebral infarction, unspecified: Secondary | ICD-10-CM | POA: Diagnosis not present

## 2022-05-05 DIAGNOSIS — E872 Acidosis, unspecified: Secondary | ICD-10-CM | POA: Diagnosis not present

## 2022-05-05 DIAGNOSIS — K219 Gastro-esophageal reflux disease without esophagitis: Secondary | ICD-10-CM | POA: Diagnosis not present

## 2022-05-05 DIAGNOSIS — R0789 Other chest pain: Secondary | ICD-10-CM | POA: Diagnosis not present

## 2022-05-05 DIAGNOSIS — Z1152 Encounter for screening for COVID-19: Secondary | ICD-10-CM | POA: Diagnosis not present

## 2022-05-05 DIAGNOSIS — Z515 Encounter for palliative care: Secondary | ICD-10-CM | POA: Diagnosis not present

## 2022-05-05 DIAGNOSIS — I080 Rheumatic disorders of both mitral and aortic valves: Secondary | ICD-10-CM | POA: Diagnosis not present

## 2022-05-05 DIAGNOSIS — E875 Hyperkalemia: Secondary | ICD-10-CM | POA: Diagnosis not present

## 2022-05-05 DIAGNOSIS — Z66 Do not resuscitate: Secondary | ICD-10-CM | POA: Diagnosis not present

## 2022-05-05 LAB — ECHOCARDIOGRAM COMPLETE
AR max vel: 1.85 cm2
AV Area VTI: 2.16 cm2
AV Area mean vel: 2.27 cm2
AV Mean grad: 6 mmHg
AV Peak grad: 13.2 mmHg
Ao pk vel: 1.82 m/s
Area-P 1/2: 4.71 cm2
Calc EF: 49.8 %
Height: 73 in
MV M vel: 4.44 m/s
MV Peak grad: 78.9 mmHg
MV VTI: 2.22 cm2
Radius: 0.7 cm
S' Lateral: 4.3 cm
Single Plane A2C EF: 49.1 %
Single Plane A4C EF: 53.5 %
Weight: 2880 oz

## 2022-05-05 LAB — RENAL FUNCTION PANEL
Albumin: 3 g/dL — ABNORMAL LOW (ref 3.5–5.0)
Anion gap: 9 (ref 5–15)
BUN: 58 mg/dL — ABNORMAL HIGH (ref 6–20)
CO2: 19 mmol/L — ABNORMAL LOW (ref 22–32)
Calcium: 7.9 mg/dL — ABNORMAL LOW (ref 8.9–10.3)
Chloride: 109 mmol/L (ref 98–111)
Creatinine, Ser: 9.72 mg/dL — ABNORMAL HIGH (ref 0.61–1.24)
GFR, Estimated: 6 mL/min — ABNORMAL LOW (ref >60)
Glucose, Bld: 84 mg/dL (ref 70–99)
Phosphorus: 5.5 mg/dL — ABNORMAL HIGH (ref 2.5–4.6)
Potassium: 4.8 mmol/L (ref 3.5–5.1)
Sodium: 137 mmol/L (ref 135–145)

## 2022-05-05 LAB — LIPID PANEL
Cholesterol: 156 mg/dL (ref 0–200)
HDL: 34 mg/dL — ABNORMAL LOW (ref >40)
LDL Cholesterol: 107 mg/dL — ABNORMAL HIGH (ref 0–99)
Total CHOL/HDL Ratio: 4.6 RATIO
Triglycerides: 76 mg/dL (ref <150)
VLDL: 15 mg/dL (ref 0–40)

## 2022-05-05 LAB — CBC
HCT: 23.2 % — ABNORMAL LOW (ref 39.0–52.0)
Hemoglobin: 7.3 g/dL — ABNORMAL LOW (ref 13.0–17.0)
MCH: 31.6 pg (ref 26.0–34.0)
MCHC: 31.5 g/dL (ref 30.0–36.0)
MCV: 100.4 fL — ABNORMAL HIGH (ref 80.0–100.0)
Platelets: 191 10*3/uL (ref 150–400)
RBC: 2.31 MIL/uL — ABNORMAL LOW (ref 4.22–5.81)
RDW: 15 % (ref 11.5–15.5)
WBC: 4.2 10*3/uL (ref 4.0–10.5)
nRBC: 0 % (ref 0.0–0.2)

## 2022-05-05 LAB — IRON AND TIBC
Iron: 37 ug/dL — ABNORMAL LOW (ref 45–182)
Saturation Ratios: 19 % (ref 17.9–39.5)
TIBC: 192 ug/dL — ABNORMAL LOW (ref 250–450)
UIBC: 155 ug/dL

## 2022-05-05 LAB — TROPONIN I (HIGH SENSITIVITY)
Troponin I (High Sensitivity): 190 ng/L (ref <18)
Troponin I (High Sensitivity): 246 ng/L (ref <18)

## 2022-05-05 LAB — PARATHYROID HORMONE, INTACT (NO CA): PTH: 350 pg/mL — ABNORMAL HIGH (ref 15–65)

## 2022-05-05 LAB — FERRITIN: Ferritin: 175 ng/mL (ref 24–336)

## 2022-05-05 LAB — GLUCOSE, CAPILLARY: Glucose-Capillary: 105 mg/dL — ABNORMAL HIGH (ref 70–99)

## 2022-05-05 MED ORDER — TRAZODONE HCL 50 MG PO TABS
50.0000 mg | ORAL_TABLET | Freq: Once | ORAL | Status: AC
  Administered 2022-05-05: 50 mg via ORAL
  Filled 2022-05-05: qty 1

## 2022-05-05 MED ORDER — TRAZODONE HCL 50 MG PO TABS
50.0000 mg | ORAL_TABLET | Freq: Every evening | ORAL | Status: DC | PRN
  Administered 2022-05-06 – 2022-05-11 (×2): 50 mg via ORAL
  Filled 2022-05-05 (×2): qty 1

## 2022-05-05 MED ORDER — DIPHENHYDRAMINE HCL 50 MG/ML IJ SOLN
12.5000 mg | Freq: Three times a day (TID) | INTRAMUSCULAR | Status: DC | PRN
  Administered 2022-05-05 – 2022-05-06 (×3): 12.5 mg via INTRAVENOUS
  Filled 2022-05-05 (×3): qty 1

## 2022-05-05 MED ORDER — DARBEPOETIN ALFA 60 MCG/0.3ML IJ SOSY
60.0000 ug | PREFILLED_SYRINGE | INTRAMUSCULAR | Status: DC
  Administered 2022-05-05: 60 ug via SUBCUTANEOUS
  Filled 2022-05-05 (×2): qty 0.3

## 2022-05-05 MED ORDER — SODIUM CHLORIDE 0.9 % IV SOLN
250.0000 mg | Freq: Every day | INTRAVENOUS | Status: AC
  Administered 2022-05-05 – 2022-05-06 (×2): 250 mg via INTRAVENOUS
  Filled 2022-05-05 (×2): qty 20

## 2022-05-05 MED ORDER — SEVELAMER CARBONATE 800 MG PO TABS
800.0000 mg | ORAL_TABLET | Freq: Three times a day (TID) | ORAL | Status: DC
  Administered 2022-05-06 – 2022-05-11 (×8): 800 mg via ORAL
  Filled 2022-05-05 (×13): qty 1

## 2022-05-05 MED ORDER — CARVEDILOL 3.125 MG PO TABS
3.1250 mg | ORAL_TABLET | Freq: Two times a day (BID) | ORAL | Status: DC
  Administered 2022-05-05 (×2): 3.125 mg via ORAL
  Filled 2022-05-05 (×2): qty 1

## 2022-05-05 MED ORDER — HYDROXYZINE HCL 25 MG PO TABS: 25.0000 mg | ORAL_TABLET | Freq: Three times a day (TID) | ORAL | Status: DC | PRN

## 2022-05-05 MED ORDER — PROCHLORPERAZINE EDISYLATE 10 MG/2ML IJ SOLN
10.0000 mg | Freq: Four times a day (QID) | INTRAMUSCULAR | Status: DC | PRN
  Administered 2022-05-07: 10 mg via INTRAVENOUS
  Filled 2022-05-05 (×2): qty 2

## 2022-05-05 NOTE — Plan of Care (Signed)
  Problem: Education: Goal: Knowledge of General Education information will improve Description Including pain rating scale, medication(s)/side effects and non-pharmacologic comfort measures Outcome: Progressing   Problem: Health Behavior/Discharge Planning: Goal: Ability to manage health-related needs will improve Outcome: Progressing   

## 2022-05-05 NOTE — Progress Notes (Signed)
Patient's daughter notified night shift RN about concerns of patient having slurred speech. RN assessed patient, notified Charge nurse, Rapid response nurse and On call provider, Zebedee Iba, NP. Patient taken to CT, back to room. Patient enroute to [facility] 2C bed 1. RN notified received nurse about the situation.

## 2022-05-05 NOTE — Progress Notes (Signed)
PROGRESS NOTE    Cody Webster  SEG:[id number] DOB: [birth year] DOA: 05/04/2022 PCP: Kerin Perna, NP     Brief Narrative:  H/o HTN, CKDV, latent TB refused treatment, presents to the ED due to chest pain   Subjective:  He is weak and lethargic, but answers questions appropriately, he complaints insomnia, think he is weak and lethargic due to not able to sleep well, he asks for trazodone at night  He also c/o " internal itching" states hydroxyzine does not help, he would like to try iv benadryl   HPOA at bedside   Assessment & Plan:  Principal Problem:   Chest pain Active Problems:   Essential hypertension   CKD (chronic kidney disease) stage 5, GFR less than 15 ml/min (HCC)   Symptomatic anemia   TB lung, latent   Metabolic acidosis   CKD (chronic kidney disease), stage V (HCC)  Chest pain Troponin Echo pending Cardiology consulted  recommended holding off heparin ,   CKD5/hyperphosphatemia /hyperkalemia/metabolic acidosis not on HD and has refused HD in the past  a history on noncompliance with follow up which is why he was taken off of the transplant list at [facility]  Nephrology consulted, he is now agreeing to HD, transfer to [place]    Anemia of chronic disease Does not appear to have overt blood loss S/p prbc x1 Monitor hgb  HTN Bp stable on current regimen   Latent TB     - he is refusing to continue his medication for this; he has capacity and is able to clearly voice the consequences of lack of treatmen      I have Reviewed nursing notes, Vitals, pain scores, I/o's, Lab results and  imaging results since pt's last encounter, details please see discussion above  I ordered the following labs:  Unresulted Labs (From admission, onward)     Start     Ordered   05/06/22 0500  CBC  Tomorrow morning,   R        05/05/22 0945   05/05/22 0500  Renal function panel  Daily,   R      05/04/22 1638   05/04/22 1638  Parathyroid hormone, intact (no Ca)   Once,   R        05/04/22 1638             DVT prophylaxis: SCDs Start: 05/04/22 2227   Code Status:   Code Status: DNR, verified with patient   Family Communication: HPOA at bedside  Disposition:    Dispo: The patient is from: home              Anticipated d/c is to: home              Anticipated d/c date is: >48hrs  Antimicrobials:    Anti-infectives (From admission, onward)    None          Objective: Vitals:   05/05/22 0035 05/05/22 0103 05/05/22 0327 05/05/22 1007  BP: (!) 158/77 (!) 137/59 133/64 (!) 118/59  Pulse: 84 75 71 71  Resp: '19 18 19 16  '$ Temp: 97.8 F (36.6 C) 98.8 F (37.1 C) 98.2 F (36.8 C) 98.6 F (37 C)  TempSrc: Oral Oral Oral Oral  SpO2: 100% 99% 94% 95%  Weight:      Height:        Intake/Output Summary (Last 24 hours) at 05/05/2022 1009 Last data filed at 05/05/2022 0630 Gross per 24 hour  Intake 320 ml  Output 350 ml  Net -30 ml   Filed Weights   05/04/22 1235  Weight: 81.6 kg    Examination:  General exam: lethargic and weak, oriented x3 Respiratory system: Clear to auscultation. Respiratory effort normal. Cardiovascular system:  RRR.  Gastrointestinal system: Abdomen is nondistended, soft and nontender.  Normal bowel sounds heard. Central nervous system: lethargic and weak, oriented x3 Extremities:  no edema Skin: No rashes, lesions or ulcers Psychiatry: ethargic and weak, no agitation     Data Reviewed: I have personally reviewed  labs and visualized  imaging studies since the last encounter and formulate the plan        Scheduled Meds:  sodium chloride   Intravenous Once   aspirin EC  81 mg Oral QPM   carvedilol  3.125 mg Oral BID WC   darbepoetin (ARANESP) injection - NON-DIALYSIS  60 mcg Subcutaneous Q Thu-1800   hydrALAZINE  100 mg Oral TID   loratadine  10 mg Oral Daily   pantoprazole (PROTONIX) IV  40 mg Intravenous Q24H   sevelamer carbonate  800 mg Oral TID WC   sodium bicarbonate  650 mg  Oral BID   Continuous Infusions:  ferric gluconate (FERRLECIT) IVPB       LOS: 0 days     Florencia Reasons, MD PhD FACP [facility]  Available via Epic secure chat 7am-7pm for nonurgent issues Please page for urgent issues To page the attending provider between 7A-7P or the covering provider during after hours 7P-7A, please log into the web site [email or web address] and access using universal [place] password for that web site. If you do not have the password, please call the hospital operator.    05/05/2022, 10:09 AM

## 2022-05-05 NOTE — Consult Note (Addendum)
Cardiology Consultation   Patient ID: Cody Webster MRN: [id number]; DOB: March 26, 1966  Admit date: 05/04/2022 Date of Consult: 05/05/2022  PCP:  Cody Perna, NP   [place] [facility] Cardiologist:  Cody Hew, MD        Patient Profile:   Cody Webster is a 56 y.o. male with a hx of CKD5, HTN, diastolic CHF, GERD, DM, diabetic retinopathy, latent TB who is being seen 05/05/2022 for the evaluation of chest pain at the request of Dr. Marylyn Webster.  History of Present Illness:   Cody Webster presented to the [facility] ED on 12/20 with symptoms of chest pain. At the time he presented to the ED, it was advised that pain began 2 weeks ago, described as intermittent. Patient stated that the pain was primarily on the left side of his chest and worsens when he lays down, becoming sharp and rated 10/10. Patient also recently treated for latent TB but has since stopped taking medications because he felt like they were making him sick. On my exam today, patient's niece and POA is at bedside and was able to provide additional HPI details. By her account, patient has actually been reporting intermittent chest pain and significant orthopnea symptoms for several months. Patient admits that he frequently has to sit up on the edge of the bed at night due to shortness of breath. He has limited physical activity so he is not sure how significantly exertional tolerance has been impacted. He reports intermittent swelling of his legs as well.   Patient's history is also notable for longstanding CKD for which patient has received fistula placement but has been reluctant to agree to dialysis. It appears that at one point he was worked up for transplant at [facility] but plans were never finalized.   Cardiac history:  Patient followed by Dr. Ellyn Webster in the past, last seen 06/25/20 via audio/telehealth visit. His history includes diastolic heart failure, hypertension, and abnormal cardiovascular stress  testing. Patient underwent stress testing during a hospitalization in March of 2019 which noted moderate size region of moderate ischemia in the inferior basilar segment of inferior wall. Initial interpretation was that this was an intermediate to high risk study but subsequent review by cardiology felt like actual risk was lower. Echocardiogram at that same time found EF 55-60%, moderate LVH, grade I DD. Given lack of anginal symptoms and patient reluctance to dialysis, medical management was recommended over cardiac catheterization. Following that hospitalization, patient was seen by [facility] and then also appears to have seen a Scientist, forensic in [place]. No significant medication changes made. Appears that patient has been on a mix of anti-hypertensive medications over the years including Coreg, hydralazine, clonidine, imdur.  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   Depression    Diabetes mellitus without complication ([place])    type 2   GERD (gastroesophageal reflux disease)    Hypertension    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 ([place]) 2015   EF now improved back to normal mildly reduced   Peripheral neuropathy    Pneumonia    PTSD (post-traumatic stress disorder)    Seizures ([place])    27 years ago    Past Surgical History:  Procedure Laterality Date   AV FISTULA PLACEMENT Left 11/08/2016   Procedure: ARTERIOVENOUS (AV) FISTULA CREATION;  Surgeon: Angelia Mould, MD;  Location: [place];  Service: Vascular;  Laterality: Left;   AV FISTULA PLACEMENT Left 03/23/2020   Procedure: LEFT BASILIC VEIN FISTULA CREATION;  Surgeon: Angelia Mould, MD;  Location: [place];  Service: Vascular;  Laterality: Left;   NM MYOVIEW LTD  07/2017   "Moderate sized moderate severity" (on cardiology was very small size, small intensity)  partially reversible inferoapical//inferoseptal defect.  (Read as intermediate-high risk) -> over read by cardiology as [place]  09/2019   [facility] Small sized, mild severity reversible defect involving apical lateral & mid anterolateral wall thought to be artifacts - but CRO mild ischemia.  EF 55%. => Read as LOW RISK:   right foot surgery     TOE AMPUTATION     TRANSTHORACIC ECHOCARDIOGRAM  07/2017   EF 60%.,  Moderate LVH.  GR 1 DD.  No R WMA.  Mild RV and moderate RA dilation.   TRANSTHORACIC ECHOCARDIOGRAM  10/03/2019   [facility]: EF 40%, mild MR, mild aortic calcification     Home Medications:  Prior to Admission medications   Medication Sig Start Date End Date Taking? Authorizing Provider  acetaminophen (TYLENOL) 500 MG tablet Take 1,000 mg by mouth every 6 (six) hours as needed (for pain/headaches.).   Yes [provider]  cetirizine (ZYRTEC) 10 MG tablet TAKE 1 TABLET (10 MG TOTAL) BY MOUTH DAILY. 07/27/21  Yes Cody Perna, NP  cloNIDine (CATAPRES) 0.1 MG tablet TAKE ONE (1) TABLET BY MOUTH THREE TIMES DAILY. Patient taking differently: Take 0.1 mg by mouth 3 (three) times daily. 04/01/22  Yes Cody Perna, NP  fluticasone (FLONASE) 50 MCG/ACT nasal spray Place 2 sprays into both nostrils daily. Patient taking differently: Place 2 sprays into both nostrils daily as needed for allergies. 08/25/20  Yes Cody Perna, NP  gabapentin (NEURONTIN) 300 MG capsule Take 1 capsule (300 mg total) by mouth 2 (two) times daily. Patient taking differently: Take 300 mg by mouth at bedtime. 04/21/22  Yes Newt Minion, MD  guaiFENesin (MUCINEX) 600 MG 12 hr tablet Take 1 tablet (600 mg total) by mouth 2 (two) times daily as needed for to loosen phlegm. 10/01/20  Yes Cody Perna, NP  oxyCODONE-acetaminophen (PERCOCET) 10-325 MG tablet Take 1 tablet by mouth every 6 (six) hours as needed for pain. 03/02/22 05/31/22 Yes [provider]   traZODone (DESYREL) 100 MG tablet TAKE 1/2 TO 1 TABLET BY MOUTH AT BEDTIME AS NEEDED FOR SLEEP. Patient taking differently: Take 50-100 mg by mouth at bedtime as needed for sleep. 10/27/21  Yes Cody Perna, NP  aspirin EC 81 MG tablet Take 81 mg by mouth every evening. Patient not taking: Reported on 03/29/2022    [provider]  diclofenac Sodium (VOLTAREN) 1 % GEL Apply 4 g topically 4 (four) times daily. Patient not taking: Reported on 03/29/2022 05/24/21   Cody Perna, NP  hydrALAZINE (APRESOLINE) 50 MG tablet TAKE 2 TABLETS (100 MG TOTAL) BY MOUTH 3 (THREE) TIMES DAILY. Patient not taking: Reported on 03/29/2022 12/14/17   Leonie Man, MD  isoniazid (NYDRAZID) 300 MG tablet Take 1 tablet (300 mg total) by mouth daily. Patient not taking: Reported on 05/04/2022 03/29/22   Thayer Headings, MD  nitroGLYCERIN (NITROSTAT) 0.4 MG SL tablet Place 1 tablet (0.4 mg total) under the tongue every 5 (five) minutes as needed for chest pain (CP or SOB). 07/22/17   Erlinda Hong,  Annamaria Boots, MD  pyridOXINE (VITAMIN B6) 50 MG tablet Take 1 tablet (50 mg total) by mouth daily. Patient not taking: Reported on 05/04/2022 03/29/22   Thayer Headings, MD    Inpatient Medications: Scheduled Meds:  sodium chloride   Intravenous Once   aspirin EC  81 mg Oral QPM   cloNIDine  0.1 mg Oral TID   darbepoetin (ARANESP) injection - NON-DIALYSIS  60 mcg Subcutaneous Q Thu-1800   hydrALAZINE  100 mg Oral TID   loratadine  10 mg Oral Daily   pantoprazole (PROTONIX) IV  40 mg Intravenous Q24H   sodium bicarbonate  650 mg Oral BID   Continuous Infusions:  ferric gluconate (FERRLECIT) IVPB     PRN Meds: acetaminophen, fluticasone, HYDROmorphone (DILAUDID) injection, nitroGLYCERIN, oxyCODONE-acetaminophen **AND** oxyCODONE  Allergies:    Allergies  Allergen Reactions   Morphine And Related Shortness Of Breath and Other (See Comments)    Hallucination  Tolerates Norco/Vicodin    Social History:    Social History   Socioeconomic History   Marital status: Single    Spouse name: Not on file   Number of children: 1   Years of education: Not on file   Highest education level: Not on file  Occupational History    Comment: Disabled - for Back pain & foot ulcer  Tobacco Use   Smoking status: Every Day    Packs/day: 0.50    Years: 30.00    Total pack years: 15.00    Types: Cigarettes   Smokeless tobacco: Former    Types: Snuff, Chew    Quit date: 12/11/1983  Vaping Use   Vaping Use: Never used  Substance and Sexual Activity   Alcohol use: Not Currently    Comment: occasionally   Drug use: No   Sexual activity: Not Currently  Other Topics Concern   Not on file  Social History Narrative   Not on file   Social Determinants of Health   Financial Resource Strain: Not on file  Food Insecurity: No Food Insecurity (05/05/2022)   Hunger Vital Sign    Worried About Running Out of Food in the Last Year: Never true    Ran Out of Food in the Last Year: Never true  Transportation Needs: No Transportation Needs (05/05/2022)   PRAPARE - Hydrologist (Medical): No    Lack of Transportation (Non-Medical): No  Physical Activity: Not on file  Stress: Not on file  Social Connections: Not on file  Intimate Partner Violence: Not At Risk (05/05/2022)   Humiliation, Afraid, Rape, and Kick questionnaire    Fear of Current or Ex-Partner: No    Emotionally Abused: No    Physically Abused: No    Sexually Abused: No    Family History:    Family History  Problem Relation Age of Onset   CAD Mother    Heart attack Mother    Diabetes type II Father    Stroke Father    CAD Father    Hypertension Sister    CAD Sister    Diabetes type II Brother    Colon cancer Maternal Uncle        2015   Colon polyps Neg Hx    Esophageal cancer Neg Hx    Stomach cancer Neg Hx    Rectal cancer Neg Hx      ROS:  Please see the history of present illness.   All other  ROS reviewed and negative.     Physical  Exam/Data:   Vitals:   05/04/22 2210 05/05/22 0035 05/05/22 0103 05/05/22 0327  BP: (!) 146/69 (!) 158/77 (!) 137/59 133/64  Pulse: 78 84 75 71  Resp: _0 Temp: 98.5 F (36.9 C) 97.8 F (36.6 C) 98.8 F (37.1 C) 98.2 F (36.8 C)  TempSrc: Oral Oral Oral Oral  SpO2: 100% 100% 99% 94%  Weight:      Height:        Intake/Output Summary (Last 24 hours) at 05/05/2022 0847 Last data filed at 05/05/2022 0630 Gross per 24 hour  Intake 320 ml  Output 350 ml  Net -30 ml      05/04/2022   12:35 PM 03/29/2022    8:47 AM 04/30/2021   10:56 AM  Last 3 Weights  Weight (lbs) 180 lb 177 lb 189 lb 6.4 oz  Weight (kg) 81.647 kg 80.287 kg 85.911 kg     Body mass index is 23.75 kg/m.  General:  In no acute distress HEENT: normal Neck: Prominent EJ, appears to have mild JVP elevation Vascular: No carotid bruits, fistula bruit noted. Distal pulses 2+ bilaterally Cardiac:  normal S1, S2; RRR; LUSB systolic murmur Lungs:  clear to auscultation bilaterally, no wheezing, rhonchi or rales  Abd: soft, nontender, no hepatomegaly  Ext: no edema Musculoskeletal:  BUE and BLE strength normal and equal Skin: warm and dry  Neuro:  CNs 2-12 intact, no focal abnormalities noted Psych:  Normal affect   EKG:  The EKG was personally reviewed and demonstrates:  sinus rhythm with no acute ischemic changes appreciated. Telemetry:  Telemetry was personally reviewed and demonstrates:  sinus rhythm  Relevant CV Studies:  07/17/2020 TTE   IMPRESSIONS     1. Left ventricular ejection fraction, by estimation, is 50 to 55%. The  left ventricle has low normal function. The left ventricle has no regional  wall motion abnormalities. There is moderate left ventricular hypertrophy.  Left ventricular diastolic  parameters are consistent with Grade III diastolic dysfunction  (restrictive). Elevated left atrial pressure.   2. Right ventricular systolic  function is normal. The right ventricular  size is normal. There is moderately elevated pulmonary artery systolic  pressure. The estimated right ventricular systolic pressure is 85.8 mmHg.   3. A small pericardial effusion is present.   4. The mitral valve is normal in structure. Trivial mitral valve  regurgitation. No evidence of mitral stenosis.   5. The aortic valve is tricuspid. Aortic valve regurgitation is not  visualized. Mild to moderate aortic valve sclerosis/calcification is  present, without any evidence of aortic stenosis.   6. The inferior vena cava is dilated in size with >50% respiratory  variability, suggesting right atrial pressure of 8 mmHg.   FINDINGS   Left Ventricle: Left ventricular ejection fraction, by estimation, is 50  to 55%. The left ventricle has low normal function. The left ventricle has  no regional wall motion abnormalities. The left ventricular internal  cavity size was normal in size.  There is moderate left ventricular hypertrophy. Left ventricular diastolic  parameters are consistent with Grade III diastolic dysfunction  (restrictive). Elevated left atrial pressure.   Right Ventricle: The right ventricular size is normal. No increase in  right ventricular wall thickness. Right ventricular systolic function is  normal. There is moderately elevated pulmonary artery systolic pressure.  The tricuspid regurgitant velocity is  3.58 m/s, and with an assumed right atrial pressure of 8 mmHg, the  estimated right ventricular systolic pressure is 85.0 mmHg.  Left Atrium: Left atrial size was normal in size.   Right Atrium: Right atrial size was normal in size.   Pericardium: A small pericardial effusion is present.   Mitral Valve: The mitral valve is normal in structure. Trivial mitral  valve regurgitation. No evidence of mitral valve stenosis.   Tricuspid Valve: The tricuspid valve is normal in structure. Tricuspid  valve regurgitation is mild.    Aortic Valve: The aortic valve is tricuspid. Aortic valve regurgitation is  not visualized. Mild to moderate aortic valve sclerosis/calcification is  present, without any evidence of aortic stenosis.   Pulmonic Valve: The pulmonic valve was grossly normal. Pulmonic valve  regurgitation is trivial.   Aorta: The aortic root and ascending aorta are structurally normal, with  no evidence of dilitation.   Venous: The inferior vena cava is dilated in size with greater than 50%  respiratory variability, suggesting right atrial pressure of 8 mmHg.   IAS/Shunts: The interatrial septum was not well visualized.    07/21/2017 Myoview Stress Test  FINDINGS: EKG:   *ST segment depression was noted during stress in the II, V5 and V6 leads, and returning to baseline after less than 1 minute of recovery.   Perfusion: There is a moderate size region of moderate decreased counts in the mid and basilar segment of the inferior wall which improves from stress imaging to rest imaging.   Wall Motion: Mild LEFT ventricular dilatation. Mild septal hypokinesia.   Left Ventricular Ejection Fraction: 47 %   End diastolic volume 290 ml   End systolic volume 90 ml   IMPRESSION: 1. Moderate size region of moderate ischemia in the inferior basilar segment of the inferior wall.   2. Mild LEFT ventricular dilatation.   3. Left ventricular ejection fraction 47%   4. Non invasive risk stratification*: Intermediate to high   Laboratory Data:  High Sensitivity Troponin:   Recent Labs  Lab 05/04/22 1319 05/04/22 1511 05/04/22 2251 05/05/22 0025  TROPONINIHS 221* 220* 190* 246*     Chemistry Recent Labs  Lab 05/04/22 1319 05/04/22 2251 05/05/22 0025  NA 137 135 137  K 5.5* 5.0 4.8  CL 109 108 109  CO2 20* 18* 19*  GLUCOSE 83 106* 84  BUN 54* 56* 58*  CREATININE 9.84* 9.59* 9.72*  CALCIUM 8.3* 7.9* 7.9*  GFRNONAA 6* 6* 6*  ANIONGAP _0 Recent Labs  Lab 05/04/22 2251  05/05/22 0025  ALBUMIN 3.2* 3.0*   Lipids  Recent Labs  Lab 05/05/22 0025  CHOL 156  TRIG 76  HDL 34*  LDLCALC 107*  CHOLHDL 4.6    Hematology Recent Labs  Lab 05/04/22 1319 05/05/22 0025  WBC 4.2 4.2  RBC 2.24* 2.31*  HGB 7.0* 7.3*  HCT 22.8* 23.2*  MCV 101.8* 100.4*  MCH 31.3 31.6  MCHC 30.7 31.5  RDW 15.3 15.0  PLT 187 191   Thyroid No results for input(s): "TSH", "FREET4" in the last 168 hours.  BNP Recent Labs  Lab 05/04/22 1308  BNP 2,226.4*    DDimer No results for input(s): "DDIMER" in the last 168 hours.   Radiology/Studies:  DG Chest 2 View  Result Date: 05/04/2022 CLINICAL DATA:  cp EXAM: CHEST - 2 VIEW COMPARISON:  03/29/2012 FINDINGS: Cardiac silhouette enlarged. No evidence of pneumothorax or pleural effusion. No evidence of pulmonary edema. Aorta is calcified. No osseous abnormalities identified. IMPRESSION: Enlarged cardiac silhouette. Electronically Signed   By: Sammie Bench M.D.   On: 05/04/2022 13:19  Assessment and Plan:   Acute on chronic diastolic CHF  Patient with history of grade III diastolic dysfunction on March 2022 TTE admitted in the setting of intermittent chest pain and orthopnea. BNP elevated to 2226.4, enlarged cardiac silhouette on CXR.   Despite elevated BNP, patient is without significant peripheral edema on physical exam. Repeat echocardiogram pending today should be helpful in determining current diastolic status.  At this time, would hold diuresis and defer volume management to nephrology team as patient appears more agreeable to dialysis today.   Chest pain  Patient reporting several months of intermittent and atypical chest pain primarily associated with orthopnea. Pain is sometimes on the left side of his chest, sometimes on the right. His history is notable for a stress test in February 2022 that showed a moderate size region of moderate decreased counts in the mid and basilar segment of the inferior wall.  Ultimately ischemic evaluation was deferred given lack of symptoms at that time and CKD stage V. Troponins checked this admission: 221, 220, 190, 246  Troponin trend this admission not consistent with ACS, more likely demand ischemia with acute anemia. This may also be source of patient's chronic chest pain. ECG is without acute ischemic changes and patient is without chest pain at this time.  Given multiple risk factors for CAD, it would not be unreasonable to reconsider cardiac catheterization at some point now that patient is agreeable to dialysis. At this time, do not think this needs to occur this admission, could be arrange outpatient.   Hypertension  Poorly controlled. Patient reportedly on home regimen of Clonidine and possibly Hydralazine. This is an interesting combination and it would be reasonable to consider replacing clonidine with a beta blocker. Will plan to start low dose Coreg today and discontinue clonidine.  Acute anemia  Patient with CKD stage V found with HGB down to 7.0, has since received 1 unit PRBC. From a cardiac perspective would prefer to maintain HGB >8.0.    Management per primary team:  CKD stage V: Patient appears agreeable to dialysis, is being transferred to [facility]. Hyperkalemia   Risk Assessment/Risk Scores:        New York Heart Association (NYHA) Functional Class NYHA Class III        For questions or updates, please contact [place] Please consult [email or web address] for contact info under    Signed, Lily Kocher, PA-C  05/05/2022 8:47 AM  Patient seen and examined and agree with Lily Kocher, PA-C as detailed above.  In brief, the patient is a 56 year old male with history of CKD V, HTN, chronic HFpEF, DMII, and latent TB who presented to [facility] with chest pain for which Cardiology was consulted.   Patient has been followed by Dr. Ellyn Webster as outpatient. Had stress testing in 07/2017 which noted moderate size region of moderate  ischemia in the inferior basilar segment of inferior wall. TTE at that time showed EF 55-60%, moderate LVH, grade I DD. Given lack of anginal symptoms and patient reluctance to dialysis, medical management was recommended over cardiac catheterization.   He presents on this admission with intermittent chest pain and orthopnea. Trop 221>220>190>246. ECG with NSR with no acute ischemic changes. BNP 2226.4. TTE 05/05/22 with LVEF 45-50%, anterolateral and basal to mid inferior hypokinesis, moderate pulmonary HTN but preserved RV function, severe LAE, mild RAE, mild to moderate MR, moderate MAC. Initially he was declining HD but now has become amenable, but he states he is only willing to do  one session at this time to see how he tolerates it.  Overall, suspect his symptoms are mainly driven by acute on chronic combined systolic and diastolic HF in the setting of CKD V. He is amenable to a session of HD. If patient plans to continue to proceed with HD, we can plan for ischemic evaluation given drop in EF with wall motion abnormalities as described above and known ischemia on stress testing in 2019. If patient does not wish to continue with HD, we likely should continue with medical management of CAD due to risk of contrast with CKD V.  GEN: No acute distress.   Neck: JVD to the angle of the mandible Cardiac: RRR, 3/6 murmur in setting of LUE AV fistula Respiratory: Crackles at bases bilaterally GI: Soft, nontender, non-distended  MS: No edema; No deformity. Neuro:  Nonfocal  Psych: Normal affect    Plan: -Patient amenable to single session of HD; unsure if he will continue -If patient amenable to continuing with HD, can plan for ischemic evaluation with cath  -Continue ASA 66m daily -Start crestor 246mdaily -Continue hydralazine 10044mID -Continue coreg 3.125m49mD -Cannot add ARB/entresto/spiro due to CKD V  HeatGwyndolyn Kaufman

## 2022-05-05 NOTE — Progress Notes (Signed)
       CROSS COVER NOTE  NAME: Cody Webster MRN: [id number] DOB : [birth year]    Date of Service   05/06/2022   Patient with history of hypertension, CKD, was admitted for chest pain, lethargy, and weakness. Per bedside RN, patient has orders to transfer to [facility] for HD tonight.  Current creatinine 9.72.  Patient has been receiving Dilaudid, trazodone, hydroxyzine in the past 24 hours.    HPI/Events of Note    Rapid response was called around 8:30 PM for new onset slurred speech that was noticed initially by POA at bedside.  POA states that last known well time was around 5:45 PM.  Around shift change (7 PM) patient was waking up from his nap when POA noticed changes in his speech.  This was brought to the nurses attention closer to 8 PM.  When taking a closer look, POA also noticed some drooping of the right nasolabial fold.  During bedside assessment, patient is alert and oriented x 4.   Not currently on anticoagulation.  Currently on ASA 81 mg daily. CBG normal. VSS.   He has no sensory deficits in his face, BUE, and BLE.   No arm drift noted.  Patient has a difficult time raising bilateral lower extremities R> L.  He does report having prior weakness.  Poor vision reported by patient.  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  Patient also has difficulty completing finger-to-nose test.   NIHSS- 4  Head CT without contrast completed.  Impressions: No evidence of acute intracranial abnormality. Small vessel ischemic changes. Chronic lacunar infarcts in the right corona radiata.     Interventions/ Plan   Spoke with Neurologist Dr. Leonel Ramsay regarding this patient and Teleneurology MD recommendations.   No immediate interventions recommended at this time. Patient to transfer to [facility] as planned.       Raenette Rover, DNP, [place]

## 2022-05-05 NOTE — Progress Notes (Addendum)
2121 Code Stroke cart activatedZebedee Iba, NP at bedside. Inpt, admitted yesterday for CP. In the process if being transferred to [facility] for dialysis. Daughter noticed slurred speech at 1745 today and notified staff. Rapid response was not called until 8pm and bedside RN said Dr Leonel Ramsay with neurology ordered NCCT. Pt went for scan and is now back in room. Dr Leonel Ramsay wants teleneuro to see pt so bedside paged a code stroke activation. LKW 1745, ROS at same time as daughter was at bedside 1745. NCCT shows chronic lacunar infarct on Right side.Hgb 7.3, mRs 4 2125 TS paged 2131 Dr Hipolito Bayley on camera  Letta Kocher, Telestroke RN

## 2022-05-05 NOTE — Progress Notes (Signed)
Echocardiogram 2D Echocardiogram has been performed.  Oneal Deputy Damaris Abeln RDCS 05/05/2022, 9:45 AM

## 2022-05-05 NOTE — Progress Notes (Signed)
Patient ID: Cody Webster, male   DOB: [birth year], 56 y.o.   MRN: [id number] S: Pt seen in his room.  Complaining of itching all over.  Has discussed with his family and has changed his mind about dialysis and will attempt it to see how it goes. O:BP 133/64 (BP Location: Right Arm)   Pulse 71   Temp 98.2 F (36.8 C) (Oral)   Resp 19   Ht '6\' 1"'$  (1.854 m)   Wt 81.6 kg   SpO2 94%   BMI 23.75 kg/m   Intake/Output Summary (Last 24 hours) at 05/05/2022 0940 Last data filed at 05/05/2022 0630 Gross per 24 hour  Intake 320 ml  Output 350 ml  Net -30 ml   Intake/Output: I/O last 3 completed shifts: In: 320 [Blood:320] Out: 350 [Urine:350]  Intake/Output this shift:  No intake/output data recorded. Weight change:  Gen: NAD CVS: RRR Resp:CTA Abd: +BS, soft,NT/ND Ext: no edema  Recent Labs  Lab 05/04/22 1319 05/04/22 2251 05/05/22 0025  NA 137 135 137  K 5.5* 5.0 4.8  CL 109 108 109  CO2 20* 18* 19*  GLUCOSE 83 106* 84  BUN 54* 56* 58*  CREATININE 9.84* 9.59* 9.72*  ALBUMIN  --  3.2* 3.0*  CALCIUM 8.3* 7.9* 7.9*  PHOS  --  5.4* 5.5*   Liver Function Tests: Recent Labs  Lab 05/04/22 2251 05/05/22 0025  ALBUMIN 3.2* 3.0*   No results for input(s): "LIPASE", "AMYLASE" in the last 168 hours. No results for input(s): "AMMONIA" in the last 168 hours. CBC: Recent Labs  Lab 05/04/22 1319 05/05/22 0025  WBC 4.2 4.2  HGB 7.0* 7.3*  HCT 22.8* 23.2*  MCV 101.8* 100.4*  PLT 187 191   Cardiac Enzymes: No results for input(s): "CKTOTAL", "CKMB", "CKMBINDEX", "TROPONINI" in the last 168 hours. CBG: No results for input(s): "GLUCAP" in the last 168 hours.  Iron Studies:  Recent Labs    05/04/22 2305  IRON 37*  TIBC 192*  FERRITIN 175   Studies/Results: DG Chest 2 View  Result Date: 05/04/2022 CLINICAL DATA:  cp EXAM: CHEST - 2 VIEW COMPARISON:  03/29/2012 FINDINGS: Cardiac silhouette enlarged. No evidence of pneumothorax or pleural effusion. No evidence of  pulmonary edema. Aorta is calcified. No osseous abnormalities identified. IMPRESSION: Enlarged cardiac silhouette. Electronically Signed   By: Sammie Bench M.D.   On: 05/04/2022 13:19    sodium chloride   Intravenous Once   aspirin EC  81 mg Oral QPM   cloNIDine  0.1 mg Oral TID   darbepoetin (ARANESP) injection - NON-DIALYSIS  60 mcg Subcutaneous Q Thu-1800   hydrALAZINE  100 mg Oral TID   loratadine  10 mg Oral Daily   pantoprazole (PROTONIX) IV  40 mg Intravenous Q24H   sodium bicarbonate  650 mg Oral BID    BMET    Component Value Date/Time   NA 137 05/05/2022 0025   NA 139 04/07/2021 1127   K 4.8 05/05/2022 0025   CL 109 05/05/2022 0025   CO2 19 (L) 05/05/2022 0025   GLUCOSE 84 05/05/2022 0025   BUN 58 (H) 05/05/2022 0025   BUN 51 (H) 04/07/2021 1127   CREATININE 9.72 (H) 05/05/2022 0025   CALCIUM 7.9 (L) 05/05/2022 0025   GFRNONAA 6 (L) 05/05/2022 0025   GFRAA 9 (L) 04/23/2020 0955   CBC    Component Value Date/Time   WBC 4.2 05/05/2022 0025   RBC 2.31 (L) 05/05/2022 0025   HGB 7.3 (L) 05/05/2022 0025  HGB 9.5 (L) 04/07/2021 1127   HCT 23.2 (L) 05/05/2022 0025   HCT 28.9 (L) 04/07/2021 1127   PLT 191 05/05/2022 0025   PLT 200 04/07/2021 1127   MCV 100.4 (H) 05/05/2022 0025   MCV 93 04/07/2021 1127   MCH 31.6 05/05/2022 0025   MCHC 31.5 05/05/2022 0025   RDW 15.0 05/05/2022 0025   RDW 14.5 04/07/2021 1127   LYMPHSABS 0.8 04/07/2021 1127   MONOABS 0.4 07/22/2017 0119   EOSABS 0.2 04/07/2021 1127   BASOSABS 0.1 04/07/2021 1127    Assessment/Plan:  CKD Stage V - presenting with some mild uremic symptoms.  He has a functional LUE AVF.  He has changed his mind and is amenable for a short trial of HD to see if he tolerates it.  If he does he agrees for outpatient HD and will need renal navigator to assist with placement.  If he does not tolerate it, he would be open to go to home with hospice.  He will need to be transferred to [facility] for HD. Will continue to  educate.    Avoid nephrotoxic medications including NSAIDs and iodinated intravenous contrast exposure unless the latter is absolutely indicated.   Preferred narcotic agents for pain control are hydromorphone, fentanyl, and methadone. Morphine should not be used.  Avoid Baclofen and avoid oral sodium phosphate and magnesium citrate based laxatives / bowel preps.  Continue strict Input and Output monitoring.  Will monitor the patient closely with you and intervene or adjust therapy as indicated by changes in clinical status/labs  Chest pain - likely due to demand ischemia from profound anemia, however he has many risk factors for CAD.  Recommend cardiology evaluation, however this will be limited if he refuses dialysis. Hyperkalemia - due to CKD.  Start Lokelma 10 grams daily and start renal diet. Anemia of CKD stage V - agree with blood transfusion and will check iron stores and start ESA Chronic combined systolic and diastolic CHF - BNP 4268.  Would give IV lasix 80 mg after blood transfusion. Metabolic acidosis - will start oral sodium bicarb HTN - poorly controlled.  Currently only taking clonidine 0.1 mg tid but had been on carvedilol, hydralazine, and imdur in the past. 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  Phos elevated will start binders and follow. Itching - likely due to uremia.  Will start atarax and follow. Tobacco abuse - recommend cessation. 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  Chronic back pain - continue with percocet per primary.    Donetta Potts, MD [facility]

## 2022-05-05 NOTE — Consult Note (Signed)
[facility] TeleNeurology Consult Services   Patient Name:   Cody Webster, Cody Webster Date of Birth:   [birth year] Identification Number:   MRN - [id number] Date of Service:   05/05/2022 21:25:41  Diagnosis:       R47.81 - Slurred speech  Impression:      56 yo male with a history of HTN, DM, CKD - hemodialysis dependent was admitted for chest pain now with increased dysarthria in the setting of anemia and creatinine of 9.7. Recommend brain MRI with MRA head and neck to rule out stroke and assess vessel status.  Our recommendations are outlined below.  Recommendations:        Stroke/Telemetry Floor       Neuro Checks       Bedside Swallow Eval       DVT Prophylaxis       IV Fluids, Normal Saline       Head of Bed 30 Degrees       Euglycemia and Avoid Hyperthermia (PRN Acetaminophen)       Initiate or continue Aspirin 81 MG daily       Antihypertensives PRN if Blood pressure is greater than 220/120 or there is a concern for End organ damage/contraindications for permissive HTN. If blood pressure is greater than 220/120 give labetalol PO or IV or Vasotec IV with a goal of 15% reduction in BP during the first 24 hours.  Sign Out:       Discussed with Primary Attending    ------------------------------------------------------------------------------  Advanced Imaging: Advanced Imaging Deferred because:  Non-disabling symptoms as verified by the patient; no cortical signs so not consistent with LVO   Metrics: Last Known Well: 05/05/2022 17:45:00 [facility] Notification Time: 05/05/2022 21:25:41 Stamp Time: 05/05/2022 21:25:41 Initial Response Time: 05/05/2022 21:31:24 Symptoms: slurred speech. Initial patient interaction: 05/05/2022 21:34:13 NIHSS Assessment Completed: 05/05/2022 21:45:00 Patient is not a candidate for Thrombolytic. Thrombolytic Medical Decision: 05/05/2022 21:45:00 Patient was not deemed candidate for Thrombolytic because of following  reasons: No disabling symptoms.  I personally Reviewed the CT Head and it Showed no acute findings; chronic small vessel disease is present  Primary Provider Notified of Diagnostic Impression and Management Plan on: 05/05/2022 21:58:41 Spoke With: NP and Dr. Leonel Ramsay at [facility] 05/05/2022 21:58:41    ------------------------------------------------------------------------------  History of Present Illness: Patient is a 56 year old Male.  Inpatient stroke alert was called for symptoms of slurred speech. 56 yo male with a history of HTN, DM, CKD - hemodialysis dependent was admitted for chest pain yesterday. Plan is to transfer him to [facility] for hemodialysis. This afternoon his daughter reported that he had increased slurred speech; of note, he has had lower extremity weakness since admission but the NP states that has been present since admission. He is also anemic with a hemoglobin of 7.3.    Past Medical History:      Hypertension      Diabetes Mellitus [place] PMH:  CKD - on Hemodialysis  Anemia  R eye blindness  Medications:  No Anticoagulant use  Antiplatelet use: Yes ASA Reviewed EMR for current medications  Allergies:  Reviewed  Social History: Smoking: Yes  Family History:  There Is Family History TI:[id number] - stroke There is no family history of premature cerebrovascular disease pertinent to this consultation  ROS : 14 Points Review of Systems was performed and was negative except mentioned in HPI.  Past Surgical History: There Is No Surgical History Contributory To Today's Visit  Examination: BP(137/64), Pulse(69), Blood Glucose(105) 1A: Level of Consciousness - Alert; keenly responsive + 0 1B: Ask Month and Age - Both Questions Right + 0 1C: Blink Eyes & Squeeze Hands - Performs Both Tasks + 0 2: Test Horizontal Extraocular Movements - Normal + 0 3: Test Visual Fields - No Visual Loss + 0 4: Test Facial Palsy (Use Grimace  if Obtunded) - Normal symmetry + 0 5A: Test Left Arm Motor Drift - No Drift for 10 Seconds + 0 5B: Test Right Arm Motor Drift - No Drift for 10 Seconds + 0 6A: Test Left Leg Motor Drift - Drift, but doesn't hit bed + 1 6B: Test Right Leg Motor Drift - Some Effort Against Gravity + 2 7: Test Limb Ataxia (FNF/Heel-Shin) - No Ataxia + 0 8: Test Sensation - Normal; No sensory loss + 0 9: Test Language/Aphasia - Normal; No aphasia + 0 10: Test Dysarthria - Mild-Moderate Dysarthria: Slurring but can be understood + 1 11: Test Extinction/Inattention - No abnormality + 0  NIHSS Score: 4  NIHSS Free Text : LE weakness is chronic per NP  Pre-Morbid Modified Rankin Scale: 4 Points = Moderately severe disability; unable to walk and attend to bodily needs without assistance  Spoke with : NP and Dr. Leonel Ramsay at [facility]  Patient/Family was informed the Neurology Consult would occur via TeleHealth consult by way of interactive audio and video telecommunications and consented to receiving care in this manner.   Patient is being evaluated for possible acute neurologic impairment and high probability of imminent or life-threatening deterioration. I spent total of 30 minutes providing care to this patient, including time for face to face visit via telemedicine, review of medical records, imaging studies and discussion of findings with providers, the patient and/or family.   Dr Patrcia Dolly   [facility] For Inpatient follow-up with [facility] physician please call [facility] [phone]. This is not an outpatient service. Post hospital discharge, please contact hospital directly.  Please do not communicate with [facility] physicians via secure chat. If you have any questions, Please contact [facility].

## 2022-05-06 ENCOUNTER — Inpatient Hospital Stay (HOSPITAL_COMMUNITY): Payer: Medicaid Other

## 2022-05-06 DIAGNOSIS — R079 Chest pain, unspecified: Secondary | ICD-10-CM | POA: Diagnosis not present

## 2022-05-06 DIAGNOSIS — G9389 Other specified disorders of brain: Secondary | ICD-10-CM | POA: Diagnosis not present

## 2022-05-06 DIAGNOSIS — Z515 Encounter for palliative care: Secondary | ICD-10-CM

## 2022-05-06 DIAGNOSIS — N185 Chronic kidney disease, stage 5: Secondary | ICD-10-CM | POA: Diagnosis not present

## 2022-05-06 DIAGNOSIS — I639 Cerebral infarction, unspecified: Secondary | ICD-10-CM | POA: Diagnosis not present

## 2022-05-06 DIAGNOSIS — I132 Hypertensive heart and chronic kidney disease with heart failure and with stage 5 chronic kidney disease, or end stage renal disease: Secondary | ICD-10-CM | POA: Diagnosis not present

## 2022-05-06 DIAGNOSIS — I509 Heart failure, unspecified: Secondary | ICD-10-CM | POA: Diagnosis not present

## 2022-05-06 DIAGNOSIS — D649 Anemia, unspecified: Secondary | ICD-10-CM

## 2022-05-06 DIAGNOSIS — E875 Hyperkalemia: Secondary | ICD-10-CM | POA: Diagnosis not present

## 2022-05-06 DIAGNOSIS — R0602 Shortness of breath: Secondary | ICD-10-CM | POA: Diagnosis not present

## 2022-05-06 DIAGNOSIS — I10 Essential (primary) hypertension: Secondary | ICD-10-CM

## 2022-05-06 DIAGNOSIS — R072 Precordial pain: Secondary | ICD-10-CM | POA: Diagnosis not present

## 2022-05-06 DIAGNOSIS — I5042 Chronic combined systolic (congestive) and diastolic (congestive) heart failure: Secondary | ICD-10-CM | POA: Diagnosis not present

## 2022-05-06 DIAGNOSIS — I6302 Cerebral infarction due to thrombosis of basilar artery: Secondary | ICD-10-CM | POA: Diagnosis not present

## 2022-05-06 LAB — BPAM RBC
Blood Product Expiration Date: 202312312359
ISSUE DATE / TIME: 202312210041
Unit Type and Rh: 1700

## 2022-05-06 LAB — CBC
HCT: 27.4 % — ABNORMAL LOW (ref 39.0–52.0)
Hemoglobin: 8.7 g/dL — ABNORMAL LOW (ref 13.0–17.0)
MCH: 30.9 pg (ref 26.0–34.0)
MCHC: 31.8 g/dL (ref 30.0–36.0)
MCV: 97.2 fL (ref 80.0–100.0)
Platelets: 178 10*3/uL (ref 150–400)
RBC: 2.82 MIL/uL — ABNORMAL LOW (ref 4.22–5.81)
RDW: 16.1 % — ABNORMAL HIGH (ref 11.5–15.5)
WBC: 4.5 10*3/uL (ref 4.0–10.5)
nRBC: 0 % (ref 0.0–0.2)

## 2022-05-06 LAB — RENAL FUNCTION PANEL
Albumin: 3 g/dL — ABNORMAL LOW (ref 3.5–5.0)
Anion gap: 10 (ref 5–15)
BUN: 59 mg/dL — ABNORMAL HIGH (ref 6–20)
CO2: 18 mmol/L — ABNORMAL LOW (ref 22–32)
Calcium: 8.1 mg/dL — ABNORMAL LOW (ref 8.9–10.3)
Chloride: 110 mmol/L (ref 98–111)
Creatinine, Ser: 10.19 mg/dL — ABNORMAL HIGH (ref 0.61–1.24)
GFR, Estimated: 5 mL/min — ABNORMAL LOW (ref >60)
Glucose, Bld: 88 mg/dL (ref 70–99)
Phosphorus: 5.7 mg/dL — ABNORMAL HIGH (ref 2.5–4.6)
Potassium: 4.8 mmol/L (ref 3.5–5.1)
Sodium: 138 mmol/L (ref 135–145)

## 2022-05-06 LAB — MRSA NEXT GEN BY PCR, NASAL: MRSA by PCR Next Gen: NOT DETECTED

## 2022-05-06 LAB — TYPE AND SCREEN
ABO/RH(D): B POS
Antibody Screen: NEGATIVE
Unit division: 0

## 2022-05-06 LAB — HEPATITIS B SURFACE ANTIGEN: Hepatitis B Surface Ag: NONREACTIVE

## 2022-05-06 MED ORDER — HYDROXYZINE HCL 10 MG PO TABS
10.0000 mg | ORAL_TABLET | Freq: Three times a day (TID) | ORAL | Status: DC | PRN
  Administered 2022-05-06 – 2022-05-07 (×2): 10 mg via ORAL
  Filled 2022-05-06 (×2): qty 1

## 2022-05-06 MED ORDER — LORAZEPAM 2 MG/ML IJ SOLN
0.5000 mg | Freq: Once | INTRAMUSCULAR | Status: AC | PRN
  Administered 2022-05-06: 0.5 mg via INTRAVENOUS
  Filled 2022-05-06: qty 1

## 2022-05-06 MED ORDER — ATORVASTATIN CALCIUM 40 MG PO TABS
40.0000 mg | ORAL_TABLET | Freq: Every day | ORAL | Status: DC
  Administered 2022-05-06 – 2022-05-12 (×7): 40 mg via ORAL
  Filled 2022-05-06 (×7): qty 1

## 2022-05-06 MED ORDER — CHLORHEXIDINE GLUCONATE CLOTH 2 % EX PADS: 6.0000 | MEDICATED_PAD | Freq: Every day | CUTANEOUS | Status: DC

## 2022-05-06 NOTE — Progress Notes (Signed)
Patient ID: Ramonte Mena, male   DOB: 05-18-1965, 56 y.o.   MRN: [id number] S: Events of last 24 hours noted, still with some dysarthria.  MRI with small acute infarcts noted. O:BP (!) 146/69 (BP Location: Right Arm)   Pulse 78   Temp 98.3 F (36.8 C) (Oral)   Resp 14   Ht '6\' 1"'$  (1.854 m)   Wt 77.5 kg   SpO2 93%   BMI 22.54 kg/m   Intake/Output Summary (Last 24 hours) at 05/06/2022 0936 Last data filed at 05/06/2022 0847 Gross per 24 hour  Intake 840 ml  Output 600 ml  Net 240 ml   Intake/Output: I/O last 3 completed shifts: In: 920 [P.O.:600; Blood:320] Out: 950 [Urine:950]  Intake/Output this shift:  Total I/O In: 240 [P.O.:240] Out: -  Weight change: -4.148 kg Gen:NAD CVS: RRR Resp: CTA Abd: +BS, soft, NT/ND Ext: no edema, LUE AVF +T/B  Recent Labs  Lab 05/04/22 1319 05/04/22 2251 05/05/22 0025 05/06/22 0026  NA 137 135 137 138  K 5.5* 5.0 4.8 4.8  CL 109 108 109 110  CO2 20* 18* 19* 18*  GLUCOSE 83 106* 84 88  BUN 54* 56* 58* 59*  CREATININE 9.84* 9.59* 9.72* 10.19*  ALBUMIN  --  3.2* 3.0* 3.0*  CALCIUM 8.3* 7.9* 7.9* 8.1*  PHOS  --  5.4* 5.5* 5.7*   Liver Function Tests: Recent Labs  Lab 05/04/22 2251 05/05/22 0025 05/06/22 0026  ALBUMIN 3.2* 3.0* 3.0*   No results for input(s): "LIPASE", "AMYLASE" in the last 168 hours. No results for input(s): "AMMONIA" in the last 168 hours. CBC: Recent Labs  Lab 05/04/22 1319 05/05/22 0025 05/06/22 0026  WBC 4.2 4.2 4.5  HGB 7.0* 7.3* 8.7*  HCT 22.8* 23.2* 27.4*  MCV 101.8* 100.4* 97.2  PLT 187 191 178   Cardiac Enzymes: No results for input(s): "CKTOTAL", "CKMB", "CKMBINDEX", "TROPONINI" in the last 168 hours. CBG: Recent Labs  Lab 05/05/22 2138  GLUCAP 105*    Iron Studies:  Recent Labs    05/04/22 2305  IRON 37*  TIBC 192*  FERRITIN 175   Studies/Results: MR BRAIN WO CONTRAST  Result Date: 05/06/2022 CLINICAL DATA:  Stroke follow-up EXAM: MRI HEAD WITHOUT CONTRAST TECHNIQUE:  Multiplanar, multiecho pulse sequences of the brain and surrounding structures were obtained without intravenous contrast. COMPARISON:  Head CT from yesterday FINDINGS: Brain: Subcentimeter areas of restricted diffusion in the subcortical left frontal lobe and temporal occipital region. Small area of restricted diffusion in the posterior right pons. Advanced chronic small vessel ischemia with extensive gliosis and small vessel infarcts affecting the brainstem, deep gray nuclei, and deep white matter tracks. Premature cerebral volume loss. Chronic microhemorrhages with hypertensive pattern. No acute hemorrhage or hydrocephalus.  No masslike finding. Vascular: Major flow voids are preserved Skull and upper cervical spine: Normal marrow signal. 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 Right more than left mastoid opacification with normal nasopharynx. IMPRESSION: 1. Small acute infarcts in the right posterior pons and subcortical left cerebral hemisphere. 2. Background of severe chronic small vessel ischemia. Electronically Signed   By: Jorje Guild M.D.   On: 05/06/2022 05:29   CT HEAD WO CONTRAST (5MM)  Result Date: 05/05/2022 CLINICAL DATA:  Slurred speech EXAM: CT HEAD WITHOUT CONTRAST TECHNIQUE: Contiguous axial images were obtained from the base of the skull through the vertex without intravenous contrast. RADIATION DOSE REDUCTION: This exam was performed according to the departmental dose-optimization program which includes automated exposure control, adjustment of the mA  and/or kV according to patient size and/or use of iterative reconstruction technique. COMPARISON:  01/11/2015 FINDINGS: Brain: No evidence of acute infarction, hemorrhage, hydrocephalus, extra-axial collection or mass lesion/mass effect. Subcortical white matter and periventricular small vessel ischemic changes. Chronic lacunar infarcts in the right corona radiata. Vascular: Intracranial atherosclerosis. Skull: Normal. Negative for  fracture or focal lesion. Sinuses/Orbits: Postprocedural changes involving the right globe. The visualized paranasal sinuses are essentially clear. The mastoid air cells are unopacified. Other: None. IMPRESSION: No evidence of acute intracranial abnormality. Small vessel ischemic changes. Chronic lacunar infarcts in the right corona radiata. Electronically Signed   By: Julian Hy M.D.   On: 05/05/2022 21:17   ECHOCARDIOGRAM COMPLETE  Result Date: 05/05/2022    ECHOCARDIOGRAM REPORT   Patient Name:   WENDEL HOMEYER Date of Exam: 05/05/2022 Medical Rec #:  [id number]     Height:       73.0 in Accession #:    [id number]    Weight:       180.0 lb Date of Birth:  August 24, 1965     BSA:          2.057 m Patient Age:    13 years      BP:           143/91 mmHg Patient Gender: M             HR:           74 bpm. Exam Location:  Inpatient Procedure: 2D Echo, Color Doppler and Cardiac Doppler Indications:    G29.52 Acute diastolic (congestive) heart failure  History:        Patient has prior history of Echocardiogram examinations, most                 recent 07/17/2020. Risk Factors:Hypertension, Diabetes,                 Dyslipidemia, Sleep Apnea and CKD.  Sonographer:    Raquel Sarna Senior RDCS Referring Phys: [id number] [place]  1. Left ventricular ejection fraction, by estimation, is 45 to 50%. The left ventricle has mildly decreased function. The left ventricle demonstrates regional wall motion abnormalities with basal to mid anterolateral and basal to mid inferior hypokinesis. There is mild concentric left ventricular hypertrophy. Left ventricular diastolic parameters are consistent with Grade II diastolic dysfunction (pseudonormalization).  2. Right ventricular systolic function is normal. The right ventricular size is mildly enlarged. There is moderately elevated pulmonary artery systolic pressure. The estimated right ventricular systolic pressure is 01.0 mmHg.  3. Left atrial size was severely dilated.   4. Right atrial size was mildly dilated.  5. The mitral valve is degenerative. Mild to moderate mitral valve regurgitation. No evidence of mitral stenosis. Moderate mitral annular calcification.  6. The aortic valve is tricuspid. There is mild calcification of the aortic valve. Aortic valve regurgitation is not visualized. No aortic stenosis is present.  7. The inferior vena cava is dilated in size with <50% respiratory variability, suggesting right atrial pressure of 15 mmHg. FINDINGS  Left Ventricle: Left ventricular ejection fraction, by estimation, is 45 to 50%. The left ventricle has mildly decreased function. The left ventricle demonstrates regional wall motion abnormalities. The left ventricular internal cavity size was normal in size. There is mild concentric left ventricular hypertrophy. Left ventricular diastolic parameters are consistent with Grade II diastolic dysfunction (pseudonormalization). Right Ventricle: The right ventricular size is mildly enlarged. No increase in right ventricular wall thickness. Right ventricular systolic function is normal.  There is moderately elevated pulmonary artery systolic pressure. The tricuspid regurgitant velocity is 3.01 m/s, and with an assumed right atrial pressure of 15 mmHg, the estimated right ventricular systolic pressure is 72.5 mmHg. Left Atrium: Left atrial size was severely dilated. Right Atrium: Right atrial size was mildly dilated. Pericardium: Trivial pericardial effusion is present. Mitral Valve: The mitral valve is degenerative in appearance. There is moderate calcification of the mitral valve leaflet(s). Moderate mitral annular calcification. Mild to moderate mitral valve regurgitation. No evidence of mitral valve stenosis. MV peak gradient, 6.9 mmHg. The mean mitral valve gradient is 3.0 mmHg. Tricuspid Valve: The tricuspid valve is normal in structure. Tricuspid valve regurgitation is trivial. Aortic Valve: The aortic valve is tricuspid. There is  mild calcification of the aortic valve. Aortic valve regurgitation is not visualized. No aortic stenosis is present. Aortic valve mean gradient measures 6.0 mmHg. Aortic valve peak gradient measures 13.2 mmHg. Aortic valve area, by VTI measures 2.16 cm. Pulmonic Valve: The pulmonic valve was normal in structure. Pulmonic valve regurgitation is not visualized. Aorta: The aortic root is normal in size and structure. Venous: The inferior vena cava is dilated in size with less than 50% respiratory variability, suggesting right atrial pressure of 15 mmHg. IAS/Shunts: No atrial level shunt detected by color flow Doppler.  LEFT VENTRICLE PLAX 2D LVIDd:         5.60 cm      Diastology LVIDs:         4.30 cm      LV e' medial:    4.23 cm/s LV PW:         1.50 cm      LV E/e' medial:  26.7 LV IVS:        1.10 cm      LV e' lateral:   7.14 cm/s LVOT diam:     1.80 cm      LV E/e' lateral: 15.8 LV SV:         75 LV SV Index:   37 LVOT Area:     2.54 cm  LV Volumes (MOD) LV vol d, MOD A2C: 173.0 ml LV vol d, MOD A4C: 182.0 ml LV vol s, MOD A2C: 88.1 ml LV vol s, MOD A4C: 84.7 ml LV SV MOD A2C:     84.9 ml LV SV MOD A4C:     182.0 ml LV SV MOD BP:      88.4 ml RIGHT VENTRICLE RV S prime:     12.40 cm/s TAPSE (M-mode): 2.4 cm LEFT ATRIUM              Index        RIGHT ATRIUM           Index LA diam:        5.30 cm  2.58 cm/m   RA Area:     21.80 cm LA Vol (A2C):   121.0 ml 58.81 ml/m  RA Volume:   66.30 ml  32.23 ml/m LA Vol (A4C):   126.0 ml 61.24 ml/m LA Biplane Vol: 127.0 ml 61.73 ml/m  AORTIC VALVE AV Area (Vmax):    1.85 cm AV Area (Vmean):   2.27 cm AV Area (VTI):     2.16 cm AV Vmax:           182.00 cm/s AV Vmean:          112.000 cm/s AV VTI:            0.349 m AV Peak  Grad:      13.2 mmHg AV Mean Grad:      6.0 mmHg LVOT Vmax:         132.00 cm/s LVOT Vmean:        99.900 cm/s LVOT VTI:          0.296 m LVOT/AV VTI ratio: 0.85  AORTA Ao Root diam: 3.20 cm Ao Asc diam:  3.00 cm MITRAL VALVE                   TRICUSPID VALVE MV Area (PHT): 4.71 cm       TR Peak grad:   36.2 mmHg MV Area VTI:   2.22 cm       TR Vmax:        301.00 cm/s MV Peak grad:  6.9 mmHg MV Mean grad:  3.0 mmHg       SHUNTS MV Vmax:       1.31 m/s       Systemic VTI:  0.30 m MV Vmean:      81.0 cm/s      Systemic Diam: 1.80 cm MV Decel Time: 161 msec MR Peak grad:    78.9 mmHg MR Mean grad:    43.0 mmHg MR Vmax:         444.00 cm/s MR Vmean:        320.0 cm/s MR PISA:         3.08 cm MR PISA Eff ROA: 21 mm MR PISA Radius:  0.70 cm MV E velocity: 113.00 cm/s MV A velocity: 40.50 cm/s MV E/A ratio:  2.79 Dalton McleanMD Electronically signed by Franki Monte Signature Date/Time: 05/05/2022/10:58:03 AM    Final    DG Chest 2 View  Result Date: 05/04/2022 CLINICAL DATA:  cp EXAM: CHEST - 2 VIEW COMPARISON:  03/29/2012 FINDINGS: Cardiac silhouette enlarged. No evidence of pneumothorax or pleural effusion. No evidence of pulmonary edema. Aorta is calcified. No osseous abnormalities identified. IMPRESSION: Enlarged cardiac silhouette. Electronically Signed   By: Sammie Bench M.D.   On: 05/04/2022 13:19    sodium chloride   Intravenous Once   aspirin EC  81 mg Oral QPM   atorvastatin  40 mg Oral Daily   Chlorhexidine Gluconate Cloth  6 each Topical Q0600   darbepoetin (ARANESP) injection - NON-DIALYSIS  60 mcg Subcutaneous Q Thu-1800   loratadine  10 mg Oral Daily   pantoprazole (PROTONIX) IV  40 mg Intravenous Q24H   sevelamer carbonate  800 mg Oral TID WC   sodium bicarbonate  650 mg Oral BID    BMET    Component Value Date/Time   NA 138 05/06/2022 0026   NA 139 04/07/2021 1127   K 4.8 05/06/2022 0026   CL 110 05/06/2022 0026   CO2 18 (L) 05/06/2022 0026   GLUCOSE 88 05/06/2022 0026   BUN 59 (H) 05/06/2022 0026   BUN 51 (H) 04/07/2021 1127   CREATININE 10.19 (H) 05/06/2022 0026   CALCIUM 8.1 (L) 05/06/2022 0026   GFRNONAA 5 (L) 05/06/2022 0026   GFRAA 9 (L) 04/23/2020 0955   CBC    Component Value Date/Time    WBC 4.5 05/06/2022 0026   RBC 2.82 (L) 05/06/2022 0026   HGB 8.7 (L) 05/06/2022 0026   HGB 9.5 (L) 04/07/2021 1127   HCT 27.4 (L) 05/06/2022 0026   HCT 28.9 (L) 04/07/2021 1127   PLT 178 05/06/2022 0026   PLT 200 04/07/2021 1127   MCV 97.2 05/06/2022 0026  MCV 93 04/07/2021 1127   MCH 30.9 05/06/2022 0026   MCHC 31.8 05/06/2022 0026   RDW 16.1 (H) 05/06/2022 0026   RDW 14.5 04/07/2021 1127   LYMPHSABS 0.8 04/07/2021 1127   MONOABS 0.4 07/22/2017 0119   EOSABS 0.2 04/07/2021 1127   BASOSABS 0.1 04/07/2021 1127    Assessment/Plan: Acute CVA's - small acute infarcts in right posterior pons and subcortical left cerebral hemisphere.  Underlying severe chronic small vessel ischemia.  Neuro following.  CKD Stage V - presenting with some mild uremic symptoms.  He has a functional LUE AVF.  He has changed his mind and is amenable for a short trial of HD to see if he tolerates it.  He has not committed to long term dialysis at this time but will agree to one today.  If he does not tolerate it, or wont commit to outpatient HD, he would be open to go to home with hospice.  Will continue to educate.  Plan for first, short HD session today.   Avoid nephrotoxic medications including NSAIDs and iodinated intravenous contrast exposure unless the latter is absolutely indicated.   Preferred narcotic agents for pain control are hydromorphone, fentanyl, and methadone. Morphine should not be used.  Avoid Baclofen and avoid oral sodium phosphate and magnesium citrate based laxatives / bowel preps.  Continue strict Input and Output monitoring.  Will monitor the patient closely with you and intervene or adjust therapy as indicated by changes in clinical status/labs  Chest pain - likely due to demand ischemia from profound anemia, however he has many risk factors for CAD.  Recommend cardiology evaluation, however this will be limited if he refuses dialysis.  Workup on hold given acute CVA. Hyperkalemia - due to  CKD.  Start Lokelma 10 grams daily and start renal diet.  Improved. Anemia of CKD stage V - agree with blood transfusion and will check iron stores and start ESA Chronic combined systolic and diastolic CHF - BNP 7510.  Metabolic acidosis - will start oral sodium bicarb HTN - poorly controlled.  Currently only taking clonidine 0.1 mg tid but had been on carvedilol, hydralazine, and imdur in the past. 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  Phos elevated will start binders and follow. Itching - likely due to uremia.  Started atarax and follow. Tobacco abuse - recommend cessation. 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  Chronic back pain - continue with percocet per primary.  Donetta Potts, MD [facility]

## 2022-05-06 NOTE — Significant Event (Signed)
Rapid Response Event Note   Reason for Call :  New onset slurred speech. Last known well 1745 per daughter who is at bedside.  Initial Focused Assessment:  Patient is alert and oriented x 4, known generalized weakness, but slurred speech is new since waking from a nap around shift change at 1900. Grips strong and equal bilaterally. Vitals are within normal limits. Patient denies numbness, tingling, or altered sensations anywhere on his body. Patient does have limited to no vision at baseline in right eye. Observed limited visual field on left eye and difficulty with finger to nose assessment.  Patient is awaiting carelink transport at this time for planned transfer to [facility] for hemodialysis. Interventions:  Provider at bedside completed more thorough neuro assessment and contacted neurology. Sent patient to CT. Returned to bedside with tele-neuro for NIHSS assessment by neurologist prior to transfer.  Plan of Care:  Proceed with transfer to [facility], carelink present and transfer completed. Providers to follow up with neurology recommendations and CT results.  Event Summary:   MD Notified: A. Zebedee Iba, NP Call Time: 2030 Arrival Time: 2032 End Time: [place], RN

## 2022-05-06 NOTE — Progress Notes (Signed)
Brief Palliative Medicine Progress Note:  PMT consult received and chart reviewed.   Medical records reviewed including progress notes, labs, imaging. Noted patient has reluctantly agreed to one session of HD today 12/22, continues to refuse TB treatment, acute ischemic stroke 12/21.   Per notes, it seems patient wants to see how HD session will go prior to making decisions for continuing HD long term vs hospice. Discussed case with Dr. Doristine Bosworth. PMT will follow up after HD session for full GOC.   Thank you for allowing PMT to assist in the care of this patient.  Anuar Walgren M. Tamala Julian Brand [facility] Palliative Medicine Team Team Phone: [phone] NO CHARGE

## 2022-05-06 NOTE — Progress Notes (Signed)
Attempt x 3 to cannulate fistula in RUE. Fistula has bruit and thrill, however, unable to get blood return, provider made aware.

## 2022-05-06 NOTE — Consult Note (Addendum)
Stroke Neurology Consultation Note  Consult Requested by: Dr. Doristine Bosworth  Reason for Consult: Dysarthria with 2 acute infarcts seen on MRI  Consult Date:  05/06/22  The history was obtained from the patient and chart.  During history and examination, all items were able to obtain unless otherwise noted.  History of Present Illness:  Cody Webster is an 56 y.o. 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  Patient has end-stage renal disease but has been refusing dialysis, however, he has agreed to 1 trial session of dialysis today.  Yesterday evening, he was noted to have dysarthria and left facial droop.  He was evaluated with MRI and found to have acute infarcts in the right posterior pons and subcortical left cerebral hemisphere. Patient denies any focal neurological symptoms at present and states that his speech is affected because he took too many medications at the same time and blames the nurse for this Date last known well: Date: 05/05/2022 Time last known well: Time: 17:45 tPA Given: No: Too mild to treat MRS:  4 NIHSS:  4  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   Depression    Diabetes mellitus without complication ([place])    type 2   GERD (gastroesophageal reflux disease)    Hypertension    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 ([place]) 2015   EF now improved back to normal mildly reduced   Peripheral neuropathy    Pneumonia    PTSD (post-traumatic stress disorder)    Seizures ([place])    27 years ago     Past Surgical History:  Procedure Laterality Date   AV FISTULA PLACEMENT Left 11/08/2016   Procedure: ARTERIOVENOUS (AV) FISTULA CREATION;  Surgeon: Angelia Mould, MD;  Location: [facility];  Service: Vascular;  Laterality: Left;    AV FISTULA PLACEMENT Left 03/23/2020   Procedure: LEFT BASILIC VEIN FISTULA CREATION;  Surgeon: Angelia Mould, MD;  Location: [facility];  Service: Vascular;  Laterality: Left;   NM MYOVIEW LTD  07/2017   "Moderate sized moderate severity" (on cardiology was very small size, small intensity) partially reversible inferoapical//inferoseptal defect.  (Read as intermediate-high risk) -> over read by cardiology as [place]  09/2019   [facility] Small sized, mild severity reversible defect involving apical lateral & mid anterolateral wall thought to be artifacts - but CRO mild ischemia.  EF 55%. => Read as LOW RISK:   right foot surgery     TOE AMPUTATION     TRANSTHORACIC ECHOCARDIOGRAM  07/2017   EF 60%.,  Moderate LVH.  GR 1 DD.  No R WMA.  Mild RV and moderate RA dilation.   TRANSTHORACIC ECHOCARDIOGRAM  10/03/2019   [facility]: EF 40%, mild MR, mild aortic calcification    Family History  Problem Relation Age of Onset   CAD Mother    Heart attack Mother    Diabetes type II Father    Stroke Father    CAD Father    Hypertension Sister    CAD Sister    Diabetes type II Brother    Colon cancer Maternal Uncle        2015   Colon polyps Neg Hx  Esophageal cancer Neg Hx    Stomach cancer Neg Hx    Rectal cancer Neg Hx      Social History:  reports that he has been smoking cigarettes. He has a 15.00 pack-year smoking history. He quit smokeless tobacco use about 38 years ago.  His smokeless tobacco use included snuff and chew. He reports that he does not currently use alcohol. He reports that he does not use drugs.  Review of Systems: A full ROS was attempted today and was able to be performed.  Systems assessed include - Constitutional, Eyes, HENT, Respiratory, Cardiovascular, Gastrointestinal, Genitourinary, Integument/breast, Hematologic/lymphatic, Musculoskeletal, Neurological, Behavioral/Psych, Endocrine, Allergic/Immunologic - with pertinent responses as per  HPI.  Allergies:  Allergies  Allergen Reactions   Morphine And Related Shortness Of Breath and Other (See Comments)    Hallucination  Tolerates Norco/Vicodin     Medications: Prior to Admission:  Medications Prior to Admission  Medication Sig Dispense Refill Last Dose   acetaminophen (TYLENOL) 500 MG tablet Take 1,000 mg by mouth every 6 (six) hours as needed (for pain/headaches.).   05/04/2022   cetirizine (ZYRTEC) 10 MG tablet TAKE 1 TABLET (10 MG TOTAL) BY MOUTH DAILY. 90 tablet 6 05/03/2022   cloNIDine (CATAPRES) 0.1 MG tablet TAKE ONE (1) TABLET BY MOUTH THREE TIMES DAILY. (Patient taking differently: Take 0.1 mg by mouth 3 (three) times daily.) 90 tablet 1 05/03/2022   fluticasone (FLONASE) 50 MCG/ACT nasal spray Place 2 sprays into both nostrils daily. (Patient taking differently: Place 2 sprays into both nostrils daily as needed for allergies.) 16 g 6 unk   gabapentin (NEURONTIN) 300 MG capsule Take 1 capsule (300 mg total) by mouth 2 (two) times daily. (Patient taking differently: Take 300 mg by mouth at bedtime.) 180 capsule 1 05/03/2022   guaiFENesin (MUCINEX) 600 MG 12 hr tablet Take 1 tablet (600 mg total) by mouth 2 (two) times daily as needed for to loosen phlegm. 60 tablet 1 unk   oxyCODONE-acetaminophen (PERCOCET) 10-325 MG tablet Take 1 tablet by mouth every 6 (six) hours as needed for pain.   05/03/2022   traZODone (DESYREL) 100 MG tablet TAKE 1/2 TO 1 TABLET BY MOUTH AT BEDTIME AS NEEDED FOR SLEEP. (Patient taking differently: Take 50-100 mg by mouth at bedtime as needed for sleep.) 90 tablet 1 05/03/2022   aspirin EC 81 MG tablet Take 81 mg by mouth every evening. (Patient not taking: Reported on 03/29/2022)   Not Taking   diclofenac Sodium (VOLTAREN) 1 % GEL Apply 4 g topically 4 (four) times daily. (Patient not taking: Reported on 03/29/2022) 350 g 1 Not Taking   hydrALAZINE (APRESOLINE) 50 MG tablet TAKE 2 TABLETS (100 MG TOTAL) BY MOUTH 3 (THREE) TIMES DAILY. (Patient  not taking: Reported on 03/29/2022) 180 tablet 2 Not Taking   isoniazid (NYDRAZID) 300 MG tablet Take 1 tablet (300 mg total) by mouth daily. (Patient not taking: Reported on 05/04/2022) 30 tablet 5 Not Taking   nitroGLYCERIN (NITROSTAT) 0.4 MG SL tablet Place 1 tablet (0.4 mg total) under the tongue every 5 (five) minutes as needed for chest pain (CP or SOB). 30 tablet 0 unk   pyridOXINE (VITAMIN B6) 50 MG tablet Take 1 tablet (50 mg total) by mouth daily. (Patient not taking: Reported on 05/04/2022) 30 tablet 5 Not Taking    Test Results: CBC:  Recent Labs  Lab 05/05/22 0025 05/06/22 0026  WBC 4.2 4.5  HGB 7.3* 8.7*  HCT 23.2* 27.4*  MCV 100.4* 97.2  PLT  191 193   Basic Metabolic Panel:  Recent Labs  Lab 05/05/22 0025 05/06/22 0026  NA 137 138  K 4.8 4.8  CL 109 110  CO2 19* 18*  GLUCOSE 84 88  BUN 58* 59*  CREATININE 9.72* 10.19*  CALCIUM 7.9* 8.1*  PHOS 5.5* 5.7*   Liver Function Tests: Recent Labs  Lab 05/04/22 2251 05/05/22 0025 05/06/22 0026  ALBUMIN 3.2* 3.0* 3.0*   No results for input(s): "LIPASE", "AMYLASE" in the last 168 hours. No results for input(s): "AMMONIA" in the last 168 hours. Coagulation Studies: No results for input(s): "LABPROT", "INR" in the last 72 hours. Cardiac Enzymes: No results for input(s): "CKTOTAL", "CKMB", "CKMBINDEX", "TROPONINI" in the last 168 hours. BNP: Invalid input(s): "POCBNP" CBG:  Recent Labs  Lab 05/05/22 2138  GLUCAP 105*   Urinalysis: No results for input(s): "COLORURINE", "LABSPEC", "PHURINE", "GLUCOSEU", "HGBUR", "BILIRUBINUR", "KETONESUR", "PROTEINUR", "UROBILINOGEN", "NITRITE", "LEUKOCYTESUR" in the last 168 hours.  Invalid input(s): "APPERANCEUR" Microbiology:  Results for orders placed or performed during the hospital encounter of 05/04/22  Resp panel by RT-PCR (RSV, Flu A&B, Covid) Anterior Nasal Swab     Status: None   Collection Time: 05/04/22  1:02 PM   Specimen: Anterior Nasal Swab  Result Value Ref  Range Status   SARS Coronavirus 2 by RT PCR NEGATIVE NEGATIVE Final    Comment: (NOTE) SARS-CoV-2 target nucleic acids are NOT DETECTED.  The SARS-CoV-2 RNA is generally detectable in upper respiratory specimens during the acute phase of infection. The lowest concentration of SARS-CoV-2 viral copies this assay can detect is 138 copies/mL. A negative result does not preclude SARS-Cov-2 infection and should not be used as the sole basis for treatment or other patient management decisions. A negative result may occur with  improper specimen collection/handling, submission of specimen other than nasopharyngeal swab, presence of viral mutation(s) within the areas targeted by this assay, and inadequate number of viral copies(<138 copies/mL). A negative result must be combined with clinical observations, patient history, and epidemiological information. The expected result is Negative.  Fact Sheet for Patients:  EntrepreneurPulse.com.au  Fact Sheet for Healthcare Providers:  IncredibleEmployment.be  This test is no t yet approved or cleared by the [place] FDA and  has been authorized for detection and/or diagnosis of SARS-CoV-2 by FDA under an Emergency Use Authorization (EUA). This EUA will remain  in effect (meaning this test can be used) for the duration of the COVID-19 declaration under Section 564(b)(1) of the Act, 21 U.S.C.section 360bbb-3(b)(1), unless the authorization is terminated  or revoked sooner.       Influenza A by PCR NEGATIVE NEGATIVE Final   Influenza B by PCR NEGATIVE NEGATIVE Final    Comment: (NOTE) The Xpert Xpress SARS-CoV-2/FLU/RSV plus assay is intended as an aid in the diagnosis of influenza from Nasopharyngeal swab specimens and should not be used as a sole basis for treatment. Nasal washings and aspirates are unacceptable for Xpert Xpress SARS-CoV-2/FLU/RSV testing.  Fact Sheet for  Patients: EntrepreneurPulse.com.au  Fact Sheet for Healthcare Providers: IncredibleEmployment.be  This test is not yet approved or cleared by the [place] FDA and has been authorized for detection and/or diagnosis of SARS-CoV-2 by FDA under an Emergency Use Authorization (EUA). This EUA will remain in effect (meaning this test can be used) for the duration of the COVID-19 declaration under Section 564(b)(1) of the Act, 21 U.S.C. section 360bbb-3(b)(1), unless the authorization is terminated or revoked.     Resp Syncytial Virus by PCR NEGATIVE NEGATIVE Final  Comment: (NOTE) Fact Sheet for Patients: EntrepreneurPulse.com.au  Fact Sheet for Healthcare Providers: IncredibleEmployment.be  This test is not yet approved or cleared by the [place] FDA and has been authorized for detection and/or diagnosis of SARS-CoV-2 by FDA under an Emergency Use Authorization (EUA). This EUA will remain in effect (meaning this test can be used) for the duration of the COVID-19 declaration under Section 564(b)(1) of the Act, 21 U.S.C. section 360bbb-3(b)(1), unless the authorization is terminated or revoked.  Performed at [facility], [place] [address], [place], [place] [address]   MRSA Next Gen by PCR, Nasal     Status: None   Collection Time: 05/05/22 11:41 PM   Specimen: Nasal Mucosa; Nasal Swab  Result Value Ref Range Status   MRSA by PCR Next Gen NOT DETECTED NOT DETECTED Final    Comment: (NOTE) The GeneXpert MRSA Assay (FDA approved for NASAL specimens only), is one component of a comprehensive MRSA colonization surveillance program. It is not intended to diagnose MRSA infection nor to guide or monitor treatment for MRSA infections. Test performance is not FDA approved in patients less than 6 years old. Performed at [facility], [place] [address] [place], [place] [address]     Lipid Panel:     Component Value Date/Time   CHOL 156 05/05/2022 0025   CHOL 179 04/07/2021 1134   TRIG 76 05/05/2022 0025   HDL 34 (L) 05/05/2022 0025   HDL 48 04/07/2021 1134   CHOLHDL 4.6 05/05/2022 0025   VLDL 15 05/05/2022 0025   LDLCALC 107 (H) 05/05/2022 0025   LDLCALC 117 (H) 04/07/2021 1134   HgbA1c:  Lab Results  Component Value Date   HGBA1C 4.7 04/30/2021   Urine Drug Screen: No results found for: "LABOPIA", "COCAINSCRNUR", "LABBENZ", "AMPHETMU", "THCU", "LABBARB"  Alcohol Level: No results for input(s): "ETH" in the last 168 hours.  MR BRAIN WO CONTRAST  Result Date: 05/06/2022 CLINICAL DATA:  Stroke follow-up EXAM: MRI HEAD WITHOUT CONTRAST TECHNIQUE: Multiplanar, multiecho pulse sequences of the brain and surrounding structures were obtained without intravenous contrast. COMPARISON:  Head CT from yesterday FINDINGS: Brain: Subcentimeter areas of restricted diffusion in the subcortical left frontal lobe and temporal occipital region. Small area of restricted diffusion in the posterior right pons. Advanced chronic small vessel ischemia with extensive gliosis and small vessel infarcts affecting the brainstem, deep gray nuclei, and deep white matter tracks. Premature cerebral volume loss. Chronic microhemorrhages with hypertensive pattern. No acute hemorrhage or hydrocephalus.  No masslike finding. Vascular: Major flow voids are preserved Skull and upper cervical spine: Normal marrow signal. 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 Right more than left mastoid opacification with normal nasopharynx. IMPRESSION: 1. Small acute infarcts in the right posterior pons and subcortical left cerebral hemisphere. 2. Background of severe chronic small vessel ischemia. Electronically Signed   By: Jorje Guild M.D.   On: 05/06/2022 05:29   CT HEAD WO CONTRAST (5MM)  Result Date: 05/05/2022 CLINICAL DATA:  Slurred speech EXAM: CT HEAD WITHOUT CONTRAST TECHNIQUE: Contiguous axial  images were obtained from the base of the skull through the vertex without intravenous contrast. RADIATION DOSE REDUCTION: This exam was performed according to the departmental dose-optimization program which includes automated exposure control, adjustment of the mA and/or kV according to patient size and/or use of iterative reconstruction technique. COMPARISON:  01/11/2015 FINDINGS: Brain: No evidence of acute infarction, hemorrhage, hydrocephalus, extra-axial collection or mass lesion/mass effect. Subcortical white matter and periventricular small vessel ischemic changes. Chronic  lacunar infarcts in the right corona radiata. Vascular: Intracranial atherosclerosis. Skull: Normal. Negative for fracture or focal lesion. Sinuses/Orbits: Postprocedural changes involving the right globe. The visualized paranasal sinuses are essentially clear. The mastoid air cells are unopacified. Other: None. IMPRESSION: No evidence of acute intracranial abnormality. Small vessel ischemic changes. Chronic lacunar infarcts in the right corona radiata. Electronically Signed   By: Julian Hy M.D.   On: 05/05/2022 21:17   ECHOCARDIOGRAM COMPLETE  Result Date: 05/05/2022    ECHOCARDIOGRAM REPORT   Patient Name:   AIKEEM LILLEY Date of Exam: 05/05/2022 Medical Rec #:  [id number]     Height:       73.0 in Accession #:    [id number]    Weight:       180.0 lb Date of Birth:  11-26-1965     BSA:          2.057 m Patient Age:    14 years      BP:           143/91 mmHg Patient Gender: M             HR:           74 bpm. Exam Location:  Inpatient Procedure: 2D Echo, Color Doppler and Cardiac Doppler Indications:    R15.40 Acute diastolic (congestive) heart failure  History:        Patient has prior history of Echocardiogram examinations, most                 recent 07/17/2020. Risk Factors:Hypertension, Diabetes,                 Dyslipidemia, Sleep Apnea and CKD.  Sonographer:    Raquel Sarna Senior RDCS Referring Phys: [id number] [place]  1. Left ventricular ejection fraction, by estimation, is 45 to 50%. The left ventricle has mildly decreased function. The left ventricle demonstrates regional wall motion abnormalities with basal to mid anterolateral and basal to mid inferior hypokinesis. There is mild concentric left ventricular hypertrophy. Left ventricular diastolic parameters are consistent with Grade II diastolic dysfunction (pseudonormalization).  2. Right ventricular systolic function is normal. The right ventricular size is mildly enlarged. There is moderately elevated pulmonary artery systolic pressure. The estimated right ventricular systolic pressure is 50.9 mmHg.  3. Left atrial size was severely dilated.  4. Right atrial size was mildly dilated.  5. The mitral valve is degenerative. Mild to moderate mitral valve regurgitation. No evidence of mitral stenosis. Moderate mitral annular calcification.  6. The aortic valve is tricuspid. There is mild calcification of the aortic valve. Aortic valve regurgitation is not visualized. No aortic stenosis is present.  7. The inferior vena cava is dilated in size with <50% respiratory variability, suggesting right atrial pressure of 15 mmHg. FINDINGS  Left Ventricle: Left ventricular ejection fraction, by estimation, is 45 to 50%. The left ventricle has mildly decreased function. The left ventricle demonstrates regional wall motion abnormalities. The left ventricular internal cavity size was normal in size. There is mild concentric left ventricular hypertrophy. Left ventricular diastolic parameters are consistent with Grade II diastolic dysfunction (pseudonormalization). Right Ventricle: The right ventricular size is mildly enlarged. No increase in right ventricular wall thickness. Right ventricular systolic function is normal. There is moderately elevated pulmonary artery systolic pressure. The tricuspid regurgitant velocity is 3.01 m/s, and with an assumed right atrial pressure of 15  mmHg, the estimated right ventricular systolic pressure is 32.6 mmHg. Left Atrium: Left atrial size was  severely dilated. Right Atrium: Right atrial size was mildly dilated. Pericardium: Trivial pericardial effusion is present. Mitral Valve: The mitral valve is degenerative in appearance. There is moderate calcification of the mitral valve leaflet(s). Moderate mitral annular calcification. Mild to moderate mitral valve regurgitation. No evidence of mitral valve stenosis. MV peak gradient, 6.9 mmHg. The mean mitral valve gradient is 3.0 mmHg. Tricuspid Valve: The tricuspid valve is normal in structure. Tricuspid valve regurgitation is trivial. Aortic Valve: The aortic valve is tricuspid. There is mild calcification of the aortic valve. Aortic valve regurgitation is not visualized. No aortic stenosis is present. Aortic valve mean gradient measures 6.0 mmHg. Aortic valve peak gradient measures 13.2 mmHg. Aortic valve area, by VTI measures 2.16 cm. Pulmonic Valve: The pulmonic valve was normal in structure. Pulmonic valve regurgitation is not visualized. Aorta: The aortic root is normal in size and structure. Venous: The inferior vena cava is dilated in size with less than 50% respiratory variability, suggesting right atrial pressure of 15 mmHg. IAS/Shunts: No atrial level shunt detected by color flow Doppler.  LEFT VENTRICLE PLAX 2D LVIDd:         5.60 cm      Diastology LVIDs:         4.30 cm      LV e' medial:    4.23 cm/s LV PW:         1.50 cm      LV E/e' medial:  26.7 LV IVS:        1.10 cm      LV e' lateral:   7.14 cm/s LVOT diam:     1.80 cm      LV E/e' lateral: 15.8 LV SV:         75 LV SV Index:   37 LVOT Area:     2.54 cm  LV Volumes (MOD) LV vol d, MOD A2C: 173.0 ml LV vol d, MOD A4C: 182.0 ml LV vol s, MOD A2C: 88.1 ml LV vol s, MOD A4C: 84.7 ml LV SV MOD A2C:     84.9 ml LV SV MOD A4C:     182.0 ml LV SV MOD BP:      88.4 ml RIGHT VENTRICLE RV S prime:     12.40 cm/s TAPSE (M-mode): 2.4 cm LEFT ATRIUM               Index        RIGHT ATRIUM           Index LA diam:        5.30 cm  2.58 cm/m   RA Area:     21.80 cm LA Vol (A2C):   121.0 ml 58.81 ml/m  RA Volume:   66.30 ml  32.23 ml/m LA Vol (A4C):   126.0 ml 61.24 ml/m LA Biplane Vol: 127.0 ml 61.73 ml/m  AORTIC VALVE AV Area (Vmax):    1.85 cm AV Area (Vmean):   2.27 cm AV Area (VTI):     2.16 cm AV Vmax:           182.00 cm/s AV Vmean:          112.000 cm/s AV VTI:            0.349 m AV Peak Grad:      13.2 mmHg AV Mean Grad:      6.0 mmHg LVOT Vmax:         132.00 cm/s LVOT Vmean:        99.900  cm/s LVOT VTI:          0.296 m LVOT/AV VTI ratio: 0.85  AORTA Ao Root diam: 3.20 cm Ao Asc diam:  3.00 cm MITRAL VALVE                  TRICUSPID VALVE MV Area (PHT): 4.71 cm       TR Peak grad:   36.2 mmHg MV Area VTI:   2.22 cm       TR Vmax:        301.00 cm/s MV Peak grad:  6.9 mmHg MV Mean grad:  3.0 mmHg       SHUNTS MV Vmax:       1.31 m/s       Systemic VTI:  0.30 m MV Vmean:      81.0 cm/s      Systemic Diam: 1.80 cm MV Decel Time: 161 msec MR Peak grad:    78.9 mmHg MR Mean grad:    43.0 mmHg MR Vmax:         444.00 cm/s MR Vmean:        320.0 cm/s MR PISA:         3.08 cm MR PISA Eff ROA: 21 mm MR PISA Radius:  0.70 cm MV E velocity: 113.00 cm/s MV A velocity: 40.50 cm/s MV E/A ratio:  2.79 Dalton McleanMD Electronically signed by Franki Monte Signature Date/Time: 05/05/2022/10:58:03 AM    Final    DG Chest 2 View  Result Date: 05/04/2022 CLINICAL DATA:  cp EXAM: CHEST - 2 VIEW COMPARISON:  03/29/2012 FINDINGS: Cardiac silhouette enlarged. No evidence of pneumothorax or pleural effusion. No evidence of pulmonary edema. Aorta is calcified. No osseous abnormalities identified. IMPRESSION: Enlarged cardiac silhouette. Electronically Signed   By: Sammie Bench M.D.   On: 05/04/2022 13:19     EKG: prolonged QT interval.   Physical Examination: Temp:  [97.7 F (36.5 C)-98.5 F (36.9 C)] 97.8 F (36.6 C) (12/22 1205) Pulse Rate:   [68-81] 81 (12/22 1205) Resp:  [10-21] 21 (12/22 1205) BP: (128-164)/(59-75) 164/75 (12/22 1205) SpO2:  [93 %-100 %] 97 % (12/22 1205) Weight:  [77.5 kg] 77.5 kg (12/21 2300)  General - predominant heart Rate 50  Tremors we will be repeated shortly. Cholera had a BM frail malnourished middle-aged African-American male, in no apparent distress.  Ophthalmologic -cloudiness of left eye  Cardiovascular - Regular rate and rhythm.  Mental Status -  Level of arousal and orientation to time, place, and person were intact. Language including  was assessed and found intact. Speech was somewhat tangential and dysarthric at times with mostly clear Recent and remote memory were intact.  Cranial Nerves II - XII - II - Visual field intact OU. III, IV, VI - Extraocular movements intact on left. V - Facial sensation intact bilaterally. VII - Facial movement intact bilaterally. VIII - Hearing & vestibular intact bilaterally. X -slight dysarthria XII - Tongue protrusion intact.  Motor Strength - The patient's strength was normal in all extremities and pronator drift was absent.  Bulk was normal and fasciculations were absent.   Motor Tone - Muscle tone was assessed at the neck and appendages and was normal.   Sensory - Light touch was symmetrical.    Coordination - The patient had normal movements in the hands and feet with no ataxia or dysmetria.  Tremor was absent.  Gait and Station - deferred.   Assessment:  [name] is a 56 y.o. male  with history of CKD stage V, hypertension, GERD and latent TB who was originally seen with chest pain but developed dysarthria and left facial droop last night. He did not receive IV TNK due to symptoms being too mild to treat.   Stroke: Infarcts in right posterior pons and subcortical left hemisphere lacunar infarcts from small vessel disease CT head no acute abnormality, chronic small vessel ischemia MRI mild acute infarct in right posterior pons  and left hemisphere TCD pending Carotid Doppler pending 2D Echo EF 45 to 93%, grade 2 diastolic dysfunction, severely dilated left atrium, no atrial level shunt LDL 107 HgbA1c 4.7  SCDs for VTE prophylaxis Diet Order             Diet renal with fluid restriction Fluid restriction: 1200 mL Fluid; Room service appropriate? Yes; Fluid consistency: Thin  Diet effective now                   aspirin 81 mg daily prior to admission, now on aspirin 81 mg daily.   And Plavix 75 mg daily for 3 weeks and then Plavix alone  309 after and started note some more moderate amount call from the practice also taking phone calls stating they are okay with okay vitamin C   Therapy recommendations: Pending Disposition: Pending  Hypertension Stable Permissive hypertension (OK if < 220/120) but gradually normalize in 5-7 days Long-term BP goal normotensive  Hyperlipidemia Home meds: None LDL 107, goal < 70 Add atorvastatin 40 mg daily Continue statin at discharge  Other Stroke Risk Factors Cigarette smoker advised to stop smoking Coronary artery disease   Other Active Problems Latent TB-continue home meds End-stage renal disease-patient will have trial of dialysis today  Hospital day # 1   Thank you for this consultation and allowing [place] to participate in the care of this patient.  [place] , MSN, AGACNP-BC [facility] See Amion for schedule and pager information 05/06/2022 1:49 PM   .I have personally obtained history,examined this patient, reviewed notes, independently viewed imaging studies, participated in medical decision making and plan of care.ROS completed by me personally and pertinent positives fully documented  I have made any additions or clarifications directly to the above note. Agree with note above.  Patient presented with dysarthria and MRI scan shows pontine subcortical lacunar infarct likely from small vessel disease.  Recommend aspirin and  Plavix for 3 weeks followed by Plavix alone.  Continue ongoing stroke workup. Greater than 50% time during this 60-minute consultation visit were spent in counseling and coordination of care about lacunar stroke and discussion about evaluation and treatment and answering questions discussed with Dr. Francene Finders, [place] Pager: [phone] 05/06/2022 3:07 PM   To contact Stroke Continuity provider, please refer to [email or web address] After hours, contact General Neurology

## 2022-05-06 NOTE — Progress Notes (Signed)
Pt   is complaining chest pain. 10/10.  Pain meds  given as per mar. And nitro.    He said the pain is relief after. Plan of care ongoing.

## 2022-05-06 NOTE — Progress Notes (Signed)
PROGRESS NOTE    Cody Webster  WEX:[id number] DOB: Jul 22, 1965 DOA: 05/04/2022 PCP: Kerin Perna, NP   Brief Narrative:  Cody Webster is a 56 y.o. male with medical history significant of CKD5, HTN, GERD, latent TB. Presented with chest pain. He reports that he was on medication for latent TB, but he stopped because he thought it was making him sick. No other complaint.  Admitted to hospital service at [facility].  Cardiology and nephrology consulted.  Nephrology recommended hemodialysis, he has functional LUE AVF but patient refused dialysis in the beginning but eventually agreed and he was transferred to [facility] for that reason.  Cardiology also followed him.  They are recommending cardiac cath at some point in time during this hospitalization.  On the evening of 05/05/2022, patient was noted to have dysarthria and facial droop and code stroke was called.  MRI shows acute infarct in right posterior pons and subcortical left cerebral hemisphere.  Neurology consulted.  Assessment & Plan:   Principal Problem:   Chest pain Active Problems:   Essential hypertension   CKD (chronic kidney disease) stage 5, GFR less than 15 ml/min (HCC)   Symptomatic anemia   TB lung, latent   Metabolic acidosis   CKD (chronic kidney disease), stage V (HCC)   Acute ischemic stroke (HCC)  Chest pain: Troponin elevated but flat.  Echo shows slightly reduced LVEF, previously it was 50 to 55% and now it is 45 to 50%.  Also has regional wall motion from release with hypokinesis.  Grade 2 diastolic dysfunction.  Cardiology on board and plans for cardiac cath once stable.  CKD stage V/hyperphosphatemia/hyperkalemia/metabolic acidosis/uremia: Nephrology on board, patient has functioning LUE AVF.  Initially declined hemodialysis but eventually agreed and was transferred to [facility].  Management per nephrology.  Acute ischemic stroke: MRI report shows acute ischemic stroke.  He is already on  aspirin.  I will start him on atorvastatin, LDL 107.  Check hemoglobin A1c.  Neurology on board.  Will defer to neurology for further escalation of therapy or antiplatelet ideation.  Anemia of chronic disease: Patient's hemoglobin dropped to 7 on 05/04/2022 and he received 1 unit of PRBC transfusion.  Hemoglobin improving.  Essential hypertension: Blood pressure within normal range.  Patient is on Coreg and hydralazine.  In order to allow permissive hypertension for at least 48 hours, I will hold both of them for now and initiate him on as needed hydralazine to be given only if systolic greater than 938 or diastolic greater than 101.  Latent TB: Patient stopped taking medications and he is refusing again.  He has the capacity to make decisions for himself.  DVT prophylaxis: SCDs Start: 05/04/22 2227   Code Status: DNR  Family Communication:  None present at bedside.  Plan of care discussed with patient in length and he/she verbalized understanding and agreed with it.  Status is: Inpatient Remains inpatient appropriate because: Needs dialysis and further management for stroke.   Estimated body mass index is 22.54 kg/m as calculated from the following:   Height as of this encounter: '6\' 1"'$  (1.854 m).   Weight as of this encounter: 77.5 kg.    Nutritional Assessment: Body mass index is 22.54 kg/m.Marland Kitchen Seen by dietician.  I agree with the assessment and plan as outlined below: Nutrition Status:        . Skin Assessment: I have examined the patient's skin and I agree with the wound assessment as performed by the wound care RN  as outlined below:    Consultants:  Nephrology Cardiology Neurology   Procedures:  As above  Antimicrobials:  Anti-infectives (From admission, onward)    None         Subjective: Patient seen and examined.  As soon as I entered the room, before Ellard Artis I was able to introduce myself, he said " why are you guys trying to kill me, look at this  breakfast" Although he was having dysarthria during my evaluation but he says that his speech is better than yesterday.  He still had obvious left-sided facial droop.  Also had right lower extremity weakness.  No other complaint.  Objective: Vitals:   05/05/22 2135 05/05/22 2300 05/06/22 0322 05/06/22 0724  BP: 137/64 (!) 151/71 (!) 132/59 (!) 146/69  Pulse: 68 73 69 78  Resp:  '13 10 14  '$ Temp:  98 F (36.7 C) 98.5 F (36.9 C) 98.3 F (36.8 C)  TempSrc:  Oral Oral Oral  SpO2: 100% 99% 94% 93%  Weight:  77.5 kg    Height:  '6\' 1"'$  (1.854 m)      Intake/Output Summary (Last 24 hours) at 05/06/2022 0743 Last data filed at 05/06/2022 0214 Gross per 24 hour  Intake 600 ml  Output 600 ml  Net 0 ml   Filed Weights   05/04/22 1235 05/05/22 2300  Weight: 81.6 kg 77.5 kg    Examination:  General exam: Appears calm and comfortable  Respiratory system: Clear to auscultation. Respiratory effort normal. Cardiovascular system: S1 & S2 heard, RRR. No JVD, murmurs, rubs, gallops or clicks. No pedal edema. Gastrointestinal system: Abdomen is nondistended, soft and nontender. No organomegaly or masses felt. Normal bowel sounds heard. Central nervous system: Alert and oriented.  Dysarthria with left facial droop and 4/5 power in right lower extremity.  Normal power in rest of the extremities. Extremities: Symmetric 5 x 5 power. Skin: No rashes, lesions or ulcers Psychiatry: Judgement and insight appear normal. Mood & affect appropriate.    Data Reviewed: I have personally reviewed following labs and imaging studies  CBC: Recent Labs  Lab 05/04/22 1319 05/05/22 0025 05/06/22 0026  WBC 4.2 4.2 4.5  HGB 7.0* 7.3* 8.7*  HCT 22.8* 23.2* 27.4*  MCV 101.8* 100.4* 97.2  PLT 187 191 277   Basic Metabolic Panel: Recent Labs  Lab 05/04/22 1319 05/04/22 2251 05/05/22 0025 05/06/22 0026  NA 137 135 137 138  K 5.5* 5.0 4.8 4.8  CL 109 108 109 110  CO2 20* 18* 19* 18*  GLUCOSE 83 106*  84 88  BUN 54* 56* 58* 59*  CREATININE 9.84* 9.59* 9.72* 10.19*  CALCIUM 8.3* 7.9* 7.9* 8.1*  PHOS  --  5.4* 5.5* 5.7*   GFR: Estimated Creatinine Clearance: 8.7 mL/min (A) (by C-G formula based on SCr of 10.19 mg/dL (H)). Liver Function Tests: Recent Labs  Lab 05/04/22 2251 05/05/22 0025 05/06/22 0026  ALBUMIN 3.2* 3.0* 3.0*   No results for input(s): "LIPASE", "AMYLASE" in the last 168 hours. No results for input(s): "AMMONIA" in the last 168 hours. Coagulation Profile: No results for input(s): "INR", "PROTIME" in the last 168 hours. Cardiac Enzymes: No results for input(s): "CKTOTAL", "CKMB", "CKMBINDEX", "TROPONINI" in the last 168 hours. BNP (last 3 results) No results for input(s): "PROBNP" in the last 8760 hours. HbA1C: No results for input(s): "HGBA1C" in the last 72 hours. CBG: Recent Labs  Lab 05/05/22 2138  GLUCAP 105*   Lipid Profile: Recent Labs    05/05/22 0025  CHOL  156  HDL 34*  LDLCALC 107*  TRIG 76  CHOLHDL 4.6   Thyroid Function Tests: No results for input(s): "TSH", "T4TOTAL", "FREET4", "T3FREE", "THYROIDAB" in the last 72 hours. Anemia Panel: Recent Labs    05/04/22 2305  FERRITIN 175  TIBC 192*  IRON 37*   Sepsis Labs: No results for input(s): "PROCALCITON", "LATICACIDVEN" in the last 168 hours.  Recent Results (from the past 240 hour(s))  Resp panel by RT-PCR (RSV, Flu A&B, Covid) Anterior Nasal Swab     Status: None   Collection Time: 05/04/22  1:02 PM   Specimen: Anterior Nasal Swab  Result Value Ref Range Status   SARS Coronavirus 2 by RT PCR NEGATIVE NEGATIVE Final    Comment: (NOTE) SARS-CoV-2 target nucleic acids are NOT DETECTED.  The SARS-CoV-2 RNA is generally detectable in upper respiratory specimens during the acute phase of infection. The lowest concentration of SARS-CoV-2 viral copies this assay can detect is 138 copies/mL. A negative result does not preclude SARS-Cov-2 infection and should not be used as the sole  basis for treatment or other patient management decisions. A negative result may occur with  improper specimen collection/handling, submission of specimen other than nasopharyngeal swab, presence of viral mutation(s) within the areas targeted by this assay, and inadequate number of viral copies(<138 copies/mL). A negative result must be combined with clinical observations, patient history, and epidemiological information. The expected result is Negative.  Fact Sheet for Patients:  EntrepreneurPulse.com.au  Fact Sheet for Healthcare Providers:  IncredibleEmployment.be  This test is no t yet approved or cleared by the [place] FDA and  has been authorized for detection and/or diagnosis of SARS-CoV-2 by FDA under an Emergency Use Authorization (EUA). This EUA will remain  in effect (meaning this test can be used) for the duration of the COVID-19 declaration under Section 564(b)(1) of the Act, 21 U.S.C.section 360bbb-3(b)(1), unless the authorization is terminated  or revoked sooner.       Influenza A by PCR NEGATIVE NEGATIVE Final   Influenza B by PCR NEGATIVE NEGATIVE Final    Comment: (NOTE) The Xpert Xpress SARS-CoV-2/FLU/RSV plus assay is intended as an aid in the diagnosis of influenza from Nasopharyngeal swab specimens and should not be used as a sole basis for treatment. Nasal washings and aspirates are unacceptable for Xpert Xpress SARS-CoV-2/FLU/RSV testing.  Fact Sheet for Patients: EntrepreneurPulse.com.au  Fact Sheet for Healthcare Providers: IncredibleEmployment.be  This test is not yet approved or cleared by the [place] FDA and has been authorized for detection and/or diagnosis of SARS-CoV-2 by FDA under an Emergency Use Authorization (EUA). This EUA will remain in effect (meaning this test can be used) for the duration of the COVID-19 declaration under Section 564(b)(1) of the Act,  21 U.S.C. section 360bbb-3(b)(1), unless the authorization is terminated or revoked.     Resp Syncytial Virus by PCR NEGATIVE NEGATIVE Final    Comment: (NOTE) Fact Sheet for Patients: EntrepreneurPulse.com.au  Fact Sheet for Healthcare Providers: IncredibleEmployment.be  This test is not yet approved or cleared by the [place] FDA and has been authorized for detection and/or diagnosis of SARS-CoV-2 by FDA under an Emergency Use Authorization (EUA). This EUA will remain in effect (meaning this test can be used) for the duration of the COVID-19 declaration under Section 564(b)(1) of the Act, 21 U.S.C. section 360bbb-3(b)(1), unless the authorization is terminated or revoked.  Performed at [facility], [place] [address] [place], [place] [address]   MRSA Next Gen by PCR,  Nasal     Status: None   Collection Time: 05/05/22 11:41 PM   Specimen: Nasal Mucosa; Nasal Swab  Result Value Ref Range Status   MRSA by PCR Next Gen NOT DETECTED NOT DETECTED Final    Comment: (NOTE) The GeneXpert MRSA Assay (FDA approved for NASAL specimens only), is one component of a comprehensive MRSA colonization surveillance program. It is not intended to diagnose MRSA infection nor to guide or monitor treatment for MRSA infections. Test performance is not FDA approved in patients less than 85 years old. Performed at [facility], [place] [address], [place], [place] [address]      Radiology Studies: MR BRAIN WO CONTRAST  Result Date: 05/06/2022 CLINICAL DATA:  Stroke follow-up EXAM: MRI HEAD WITHOUT CONTRAST TECHNIQUE: Multiplanar, multiecho pulse sequences of the brain and surrounding structures were obtained without intravenous contrast. COMPARISON:  Head CT from yesterday FINDINGS: Brain: Subcentimeter areas of restricted diffusion in the subcortical left frontal lobe and temporal occipital region. Small area of restricted diffusion in  the posterior right pons. Advanced chronic small vessel ischemia with extensive gliosis and small vessel infarcts affecting the brainstem, deep gray nuclei, and deep white matter tracks. Premature cerebral volume loss. Chronic microhemorrhages with hypertensive pattern. No acute hemorrhage or hydrocephalus.  No masslike finding. Vascular: Major flow voids are preserved Skull and upper cervical spine: Normal marrow signal. 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 Right more than left mastoid opacification with normal nasopharynx. IMPRESSION: 1. Small acute infarcts in the right posterior pons and subcortical left cerebral hemisphere. 2. Background of severe chronic small vessel ischemia. Electronically Signed   By: Jorje Guild M.D.   On: 05/06/2022 05:29   CT HEAD WO CONTRAST (5MM)  Result Date: 05/05/2022 CLINICAL DATA:  Slurred speech EXAM: CT HEAD WITHOUT CONTRAST TECHNIQUE: Contiguous axial images were obtained from the base of the skull through the vertex without intravenous contrast. RADIATION DOSE REDUCTION: This exam was performed according to the departmental dose-optimization program which includes automated exposure control, adjustment of the mA and/or kV according to patient size and/or use of iterative reconstruction technique. COMPARISON:  01/11/2015 FINDINGS: Brain: No evidence of acute infarction, hemorrhage, hydrocephalus, extra-axial collection or mass lesion/mass effect. Subcortical white matter and periventricular small vessel ischemic changes. Chronic lacunar infarcts in the right corona radiata. Vascular: Intracranial atherosclerosis. Skull: Normal. Negative for fracture or focal lesion. Sinuses/Orbits: Postprocedural changes involving the right globe. The visualized paranasal sinuses are essentially clear. The mastoid air cells are unopacified. Other: None. IMPRESSION: No evidence of acute intracranial abnormality. Small vessel ischemic changes. Chronic lacunar infarcts in the right  corona radiata. Electronically Signed   By: Julian Hy M.D.   On: 05/05/2022 21:17   ECHOCARDIOGRAM COMPLETE  Result Date: 05/05/2022    ECHOCARDIOGRAM REPORT   Patient Name:   Cody Webster Date of Exam: 05/05/2022 Medical Rec #:  [id number]     Height:       73.0 in Accession #:    [id number]    Weight:       180.0 lb Date of Birth:  Dec 01, 1965     BSA:          2.057 m Patient Age:    50 years      BP:           143/91 mmHg Patient Gender: M             HR:           74 bpm. Exam  Location:  Inpatient Procedure: 2D Echo, Color Doppler and Cardiac Doppler Indications:    Z12.45 Acute diastolic (congestive) heart failure  History:        Patient has prior history of Echocardiogram examinations, most                 recent 07/17/2020. Risk Factors:Hypertension, Diabetes,                 Dyslipidemia, Sleep Apnea and CKD.  Sonographer:    Raquel Sarna Senior RDCS Referring Phys: [id number] [place]  1. Left ventricular ejection fraction, by estimation, is 45 to 50%. The left ventricle has mildly decreased function. The left ventricle demonstrates regional wall motion abnormalities with basal to mid anterolateral and basal to mid inferior hypokinesis. There is mild concentric left ventricular hypertrophy. Left ventricular diastolic parameters are consistent with Grade II diastolic dysfunction (pseudonormalization).  2. Right ventricular systolic function is normal. The right ventricular size is mildly enlarged. There is moderately elevated pulmonary artery systolic pressure. The estimated right ventricular systolic pressure is 82.5 mmHg.  3. Left atrial size was severely dilated.  4. Right atrial size was mildly dilated.  5. The mitral valve is degenerative. Mild to moderate mitral valve regurgitation. No evidence of mitral stenosis. Moderate mitral annular calcification.  6. The aortic valve is tricuspid. There is mild calcification of the aortic valve. Aortic valve regurgitation is not visualized. No  aortic stenosis is present.  7. The inferior vena cava is dilated in size with <50% respiratory variability, suggesting right atrial pressure of 15 mmHg. FINDINGS  Left Ventricle: Left ventricular ejection fraction, by estimation, is 45 to 50%. The left ventricle has mildly decreased function. The left ventricle demonstrates regional wall motion abnormalities. The left ventricular internal cavity size was normal in size. There is mild concentric left ventricular hypertrophy. Left ventricular diastolic parameters are consistent with Grade II diastolic dysfunction (pseudonormalization). Right Ventricle: The right ventricular size is mildly enlarged. No increase in right ventricular wall thickness. Right ventricular systolic function is normal. There is moderately elevated pulmonary artery systolic pressure. The tricuspid regurgitant velocity is 3.01 m/s, and with an assumed right atrial pressure of 15 mmHg, the estimated right ventricular systolic pressure is 05.3 mmHg. Left Atrium: Left atrial size was severely dilated. Right Atrium: Right atrial size was mildly dilated. Pericardium: Trivial pericardial effusion is present. Mitral Valve: The mitral valve is degenerative in appearance. There is moderate calcification of the mitral valve leaflet(s). Moderate mitral annular calcification. Mild to moderate mitral valve regurgitation. No evidence of mitral valve stenosis. MV peak gradient, 6.9 mmHg. The mean mitral valve gradient is 3.0 mmHg. Tricuspid Valve: The tricuspid valve is normal in structure. Tricuspid valve regurgitation is trivial. Aortic Valve: The aortic valve is tricuspid. There is mild calcification of the aortic valve. Aortic valve regurgitation is not visualized. No aortic stenosis is present. Aortic valve mean gradient measures 6.0 mmHg. Aortic valve peak gradient measures 13.2 mmHg. Aortic valve area, by VTI measures 2.16 cm. Pulmonic Valve: The pulmonic valve was normal in structure. Pulmonic valve  regurgitation is not visualized. Aorta: The aortic root is normal in size and structure. Venous: The inferior vena cava is dilated in size with less than 50% respiratory variability, suggesting right atrial pressure of 15 mmHg. IAS/Shunts: No atrial level shunt detected by color flow Doppler.  LEFT VENTRICLE PLAX 2D LVIDd:         5.60 cm      Diastology LVIDs:  4.30 cm      LV e' medial:    4.23 cm/s LV PW:         1.50 cm      LV E/e' medial:  26.7 LV IVS:        1.10 cm      LV e' lateral:   7.14 cm/s LVOT diam:     1.80 cm      LV E/e' lateral: 15.8 LV SV:         75 LV SV Index:   37 LVOT Area:     2.54 cm  LV Volumes (MOD) LV vol d, MOD A2C: 173.0 ml LV vol d, MOD A4C: 182.0 ml LV vol s, MOD A2C: 88.1 ml LV vol s, MOD A4C: 84.7 ml LV SV MOD A2C:     84.9 ml LV SV MOD A4C:     182.0 ml LV SV MOD BP:      88.4 ml RIGHT VENTRICLE RV S prime:     12.40 cm/s TAPSE (M-mode): 2.4 cm LEFT ATRIUM              Index        RIGHT ATRIUM           Index LA diam:        5.30 cm  2.58 cm/m   RA Area:     21.80 cm LA Vol (A2C):   121.0 ml 58.81 ml/m  RA Volume:   66.30 ml  32.23 ml/m LA Vol (A4C):   126.0 ml 61.24 ml/m LA Biplane Vol: 127.0 ml 61.73 ml/m  AORTIC VALVE AV Area (Vmax):    1.85 cm AV Area (Vmean):   2.27 cm AV Area (VTI):     2.16 cm AV Vmax:           182.00 cm/s AV Vmean:          112.000 cm/s AV VTI:            0.349 m AV Peak Grad:      13.2 mmHg AV Mean Grad:      6.0 mmHg LVOT Vmax:         132.00 cm/s LVOT Vmean:        99.900 cm/s LVOT VTI:          0.296 m LVOT/AV VTI ratio: 0.85  AORTA Ao Root diam: 3.20 cm Ao Asc diam:  3.00 cm MITRAL VALVE                  TRICUSPID VALVE MV Area (PHT): 4.71 cm       TR Peak grad:   36.2 mmHg MV Area VTI:   2.22 cm       TR Vmax:        301.00 cm/s MV Peak grad:  6.9 mmHg MV Mean grad:  3.0 mmHg       SHUNTS MV Vmax:       1.31 m/s       Systemic VTI:  0.30 m MV Vmean:      81.0 cm/s      Systemic Diam: 1.80 cm MV Decel Time: 161 msec MR Peak  grad:    78.9 mmHg MR Mean grad:    43.0 mmHg MR Vmax:         444.00 cm/s MR Vmean:        320.0 cm/s MR PISA:         3.08 cm MR PISA Eff ROA: 21  mm MR PISA Radius:  0.70 cm MV E velocity: 113.00 cm/s MV A velocity: 40.50 cm/s MV E/A ratio:  2.79 Dalton McleanMD Electronically signed by Franki Monte Signature Date/Time: 05/05/2022/10:58:03 AM    Final    DG Chest 2 View  Result Date: 05/04/2022 CLINICAL DATA:  cp EXAM: CHEST - 2 VIEW COMPARISON:  03/29/2012 FINDINGS: Cardiac silhouette enlarged. No evidence of pneumothorax or pleural effusion. No evidence of pulmonary edema. Aorta is calcified. No osseous abnormalities identified. IMPRESSION: Enlarged cardiac silhouette. Electronically Signed   By: Sammie Bench M.D.   On: 05/04/2022 13:19    Scheduled Meds:  sodium chloride   Intravenous Once   aspirin EC  81 mg Oral QPM   atorvastatin  40 mg Oral Daily   darbepoetin (ARANESP) injection - NON-DIALYSIS  60 mcg Subcutaneous Q Thu-1800   loratadine  10 mg Oral Daily   pantoprazole (PROTONIX) IV  40 mg Intravenous Q24H   sevelamer carbonate  800 mg Oral TID WC   sodium bicarbonate  650 mg Oral BID   Continuous Infusions:  ferric gluconate (FERRLECIT) IVPB 250 mg (05/05/22 1210)     LOS: 1 day   Darliss Cheney, MD [facility]  05/06/2022, 7:43 AM   *Please note that this is a verbal dictation therefore any spelling or grammatical errors are due to the "[employer]" system interpretation.  Please page via [place] and do not message via secure chat for urgent patient care matters. Secure chat can be used for non urgent patient care matters.  How to contact the [facility] Attending or Consulting provider [place] or covering provider during after hours [place], for this patient?  Check the care team in [facility] and look for a) attending/consulting [facility] provider listed and b) the [facility] team listed. Page or secure chat [phone]. Log into [email or web address] and use [place]'s universal password  to access. If you do not have the password, please contact the hospital operator. Locate the [facility] provider you are looking for under [facility] and page to a number that you can be directly reached. If you still have difficulty reaching the provider, please page the [facility] (Director on Call) for the Hospitalists listed on amion for assistance.

## 2022-05-06 NOTE — Consult Note (Signed)
ASSESSMENT & PLAN   END-STAGE RENAL DISEASE: This patient had a first stage basilic vein transposition on 03/23/2020.  On 04/19/2020 duplex showed that the vein had reasonable diameters ranging from 6.1-10.6 mm.  He was set up for a second stage basilic vein transposition but this never occurred.  Thus his fistula currently should not be used.  I agree with placement of a tunneled dialysis catheter.  I have ordered a duplex of his fistula.  If the fistula looks reasonable then we can schedule him electively for a second stage left basilic vein transposition.  If he goes home we can arrange this as an outpatient.  If he remains in the hospital this could potentially be done next week.  REASON FOR CONSULT:    Nonfunctioning left upper arm fistula.  The consult is requested by Juanell Fairly, NP.  HPI:   Cody Webster is a 56 y.o. male who was admitted on 05/04/2022 with chest pain.  He had been having intermittent chest pain over the last 2 weeks.  He also has a history of chronic kidney disease stage V (secondary to diabetes and hypertension) and was noted to have an elevated potassium and was slightly acidotic.  He was being considered for a renal transplant but apparently had some issues with noncompliance and was taken off of the list.  He initially refused dialysis but ultimately he agreed to attempt dialysis today.  This was unsuccessful.  For this reason vascular surgery was consulted.  The patient previously had a left brachiocephalic fistula which was occluded.  Most recently on 03/23/2020, he had a first stage left basilic vein transposition.  He was seen in follow-up on 04/29/2020 and the vein appeared to be reasonable size with diameters ranging from 6.1-10.6 mm.  He was set up for a second stage basilic vein transposition on 05/18/2020 but this never happened.  He does not remember why.   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 ([place]) 2015   EF now improved back to normal mildly reduced   Peripheral neuropathy    Pneumonia    PTSD (post-traumatic stress disorder)    Seizures ([place])    27 years ago    Family History  Problem Relation Age of Onset   CAD Mother    Heart attack Mother    Diabetes type II Father    Stroke Father    CAD Father    Hypertension Sister    CAD Sister    Diabetes type II Brother    Colon cancer Maternal Uncle        2015   Colon polyps Neg Hx    Esophageal cancer Neg Hx    Stomach cancer Neg Hx    Rectal cancer Neg Hx     SOCIAL HISTORY: Social History   Tobacco Use   Smoking status: Every Day    Packs/day: 0.50    Years: 30.00    Total pack years: 15.00    Types: Cigarettes   Smokeless tobacco: Former    Types: Snuff, Chew    Quit date: 12/11/1983  Substance Use Topics  Alcohol use: Not Currently    Comment: occasionally    Allergies  Allergen Reactions   Morphine And Related Shortness Of Breath and Other (See Comments)    Hallucination  Tolerates Norco/Vicodin    Current Facility-Administered Medications  Medication Dose Route Frequency Provider Last Rate Last Admin   0.9 %  sodium chloride infusion (Manually program via Guardrails IV Fluids)   Intravenous Once Marylyn Ishihara, Tyrone A, DO       acetaminophen (TYLENOL) tablet 650 mg  650 mg Oral Q4H PRN Marylyn Ishihara, Tyrone A, DO       aspirin EC tablet 81 mg  81 mg Oral QPM Kyle, Tyrone A, DO   81 mg at 05/06/22 1729   atorvastatin (LIPITOR) tablet 40 mg  40 mg Oral Daily Darliss Cheney, MD   40 mg at 05/06/22 2703   Darbepoetin Alfa (ARANESP) injection 60 mcg  60 mcg Subcutaneous Q Thu-1800 Donato Heinz, MD   60 mcg at 05/05/22 1720   diphenhydrAMINE (BENADRYL)  injection 12.5 mg  12.5 mg Intravenous Q8H PRN Florencia Reasons, MD   12.5 mg at 05/06/22 1504   fluticasone (FLONASE) 50 MCG/ACT nasal spray 2 spray  2 spray Each Nare Daily PRN Cherylann Ratel A, DO       HYDROmorphone (DILAUDID) injection 0.5 mg  0.5 mg Intravenous Q4H PRN Marylyn Ishihara, Tyrone A, DO   0.5 mg at 05/06/22 1344   hydrOXYzine (ATARAX) tablet 10 mg  10 mg Oral TID PRN Darliss Cheney, MD   10 mg at 05/06/22 1729   loratadine (CLARITIN) tablet 10 mg  10 mg Oral Daily Marylyn Ishihara, Tyrone A, DO   10 mg at 05/06/22 0851   nitroGLYCERIN (NITROSTAT) SL tablet 0.4 mg  0.4 mg Sublingual Q5 min PRN Marylyn Ishihara, Tyrone A, DO       oxyCODONE-acetaminophen (PERCOCET/ROXICET) 5-325 MG per tablet 1 tablet  1 tablet Oral Q6H PRN Marylyn Ishihara, Tyrone A, DO       And   oxyCODONE (Oxy IR/ROXICODONE) immediate release tablet 5 mg  5 mg Oral Q6H PRN Marylyn Ishihara, Tyrone A, DO       pantoprazole (PROTONIX) injection 40 mg  40 mg Intravenous Q24H Kyle, Tyrone A, DO   40 mg at 05/06/22 1729   prochlorperazine (COMPAZINE) injection 10 mg  10 mg Intravenous Q6H PRN Marylyn Ishihara, Tyrone A, DO       sevelamer carbonate (RENVELA) tablet 800 mg  800 mg Oral TID WC Donato Heinz, MD   800 mg at 05/06/22 5009   sodium bicarbonate tablet 650 mg  650 mg Oral BID Marylyn Ishihara, Tyrone A, DO   650 mg at 05/06/22 0851   traZODone (DESYREL) tablet 50 mg  50 mg Oral QHS PRN Florencia Reasons, MD        REVIEW OF SYSTEMS:  '[X]'$  denotes positive finding, '[ ]'$  denotes negative finding Cardiac  Comments:  Chest pain or chest pressure: x   Shortness of breath upon exertion:    Short of breath when lying flat:    Irregular heart rhythm:        Vascular    Pain in calf, thigh, or hip brought on by ambulation:    Pain in feet at night that wakes you up from your sleep:     Blood clot in your veins:    Leg swelling:         Pulmonary    Oxygen at home:    Productive cough:     Wheezing:  Neurologic    Sudden weakness in arms or legs:     Sudden numbness in arms or legs:      Sudden onset of difficulty speaking or slurred speech:    Temporary loss of vision in one eye:     Problems with dizziness:         Gastrointestinal    Blood in stool:     Vomited blood:         Genitourinary    Burning when urinating:     Blood in urine:        Psychiatric    Major depression:         Hematologic    Bleeding problems:    Problems with blood clotting too easily:        Skin    Rashes or ulcers:        Constitutional    Fever or chills:    -  PHYSICAL EXAM:   Vitals:   05/06/22 0322 05/06/22 0724 05/06/22 1205 05/06/22 1500  BP: (!) 132/59 (!) 146/69 (!) 164/75 (!) 158/65  Pulse: 69 78 81 90  Resp: 10 14 (!) 21 17  Temp: 98.5 F (36.9 C) 98.3 F (36.8 C) 97.8 F (36.6 C) 97.9 F (36.6 C)  TempSrc: Oral Oral Oral Oral  SpO2: 94% 93% 97% 96%  Weight:      Height:       Body mass index is 22.54 kg/m. GENERAL: The patient is a well-nourished male, in no acute distress. The vital signs are documented above. CARDIAC: There is a regular rate and rhythm.  VASCULAR: He has a palpable radial pulse bilaterally. He has a palpable thrill in his left upper arm first stage basilic vein transposition. PULMONARY: There is good air exchange bilaterally without wheezing or rales. MUSCULOSKELETAL: There are no major deformities. NEUROLOGIC: No focal weakness or paresthesias are detected. SKIN: There are no ulcers or rashes noted. PSYCHIATRIC: The patient has a normal affect.  DATA:    His potassium is 4.8.  Troponin was 246 on 05/05/2022.  Hemoglobin is 8.7.  [name] [facility]

## 2022-05-06 NOTE — Progress Notes (Addendum)
Rounding Note    Patient Name: Traevion Poehler Date of Encounter: 05/06/2022  [place] Cardiologist: Glenetta Hew, MD   Subjective   Patient with acute neurological deficits/right side weakness and slurred speech noted yesterday evening. Code stroke activated and patient ultimately found with small acute infarcts in the right posterior pons and subcortical left cerebral hemisphere. Patient is agitated this morning, feels like nothing is being done for him. He continues to report significant orthopnea. Also has occasional chest pain.  Inpatient Medications    Scheduled Meds:  sodium chloride   Intravenous Once   aspirin EC  81 mg Oral QPM   atorvastatin  40 mg Oral Daily   Chlorhexidine Gluconate Cloth  6 each Topical Q0600   darbepoetin (ARANESP) injection - NON-DIALYSIS  60 mcg Subcutaneous Q Thu-1800   loratadine  10 mg Oral Daily   pantoprazole (PROTONIX) IV  40 mg Intravenous Q24H   sevelamer carbonate  800 mg Oral TID WC   sodium bicarbonate  650 mg Oral BID   Continuous Infusions:  ferric gluconate (FERRLECIT) IVPB 250 mg (05/05/22 1210)   PRN Meds: acetaminophen, diphenhydrAMINE, fluticasone, HYDROmorphone (DILAUDID) injection, nitroGLYCERIN, oxyCODONE-acetaminophen **AND** oxyCODONE, prochlorperazine, traZODone   Vital Signs    Vitals:   05/05/22 2135 05/05/22 2300 05/06/22 0322 05/06/22 0724  BP: 137/64 (!) 151/71 (!) 132/59 (!) 146/69  Pulse: 68 73 69 78  Resp:  _0 Temp:  98 F (36.7 C) 98.5 F (36.9 C) 98.3 F (36.8 C)  TempSrc:  Oral Oral Oral  SpO2: 100% 99% 94% 93%  Weight:  77.5 kg    Height:  _1  (1.854 m)      Intake/Output Summary (Last 24 hours) at 05/06/2022 0840 Last data filed at 05/06/2022 0214 Gross per 24 hour  Intake 600 ml  Output 600 ml  Net 0 ml      05/05/2022   11:00 PM 05/04/2022   12:35 PM 03/29/2022    8:47 AM  Last 3 Weights  Weight (lbs) 170 lb 13.7 oz 180 lb 177 lb  Weight (kg) 77.5 kg 81.647  kg 80.287 kg      Telemetry    Sinus rhythm - Personally Reviewed  ECG    No new tracing  Physical Exam   GEN: No acute distress.   Neck: No JVD Cardiac: RRR. Murmur noted from patient's fistula GI: Soft, nontender, non-distended  MS: No edema; No deformity. Neuro:  Cranial nerves II-XII grossly intact. Bilateral upper extremity strength equal. Right leg noticeably weak compared with left Psych: Normal affect   Labs    High Sensitivity Troponin:   Recent Labs  Lab 05/04/22 1319 05/04/22 1511 05/04/22 2251 05/05/22 0025  TROPONINIHS 221* 220* 190* 246*     Chemistry Recent Labs  Lab 05/04/22 2251 05/05/22 0025 05/06/22 0026  NA 135 137 138  K 5.0 4.8 4.8  CL 108 109 110  CO2 18* 19* 18*  GLUCOSE 106* 84 88  BUN 56* 58* 59*  CREATININE 9.59* 9.72* 10.19*  CALCIUM 7.9* 7.9* 8.1*  ALBUMIN 3.2* 3.0* 3.0*  GFRNONAA 6* 6* 5*  ANIONGAP _2 Lipids  Recent Labs  Lab 05/05/22 0025  CHOL 156  TRIG 76  HDL 34*  LDLCALC 107*  CHOLHDL 4.6    Hematology Recent Labs  Lab 05/04/22 1319 05/05/22 0025 05/06/22 0026  WBC 4.2 4.2 4.5  RBC 2.24* 2.31* 2.82*  HGB 7.0* 7.3* 8.7*  HCT 22.8* 23.2*  27.4*  MCV 101.8* 100.4* 97.2  MCH 31.3 31.6 30.9  MCHC 30.7 31.5 31.8  RDW 15.3 15.0 16.1*  PLT 187 191 178   Thyroid No results for input(s): "TSH", "FREET4" in the last 168 hours.  BNP Recent Labs  Lab 05/04/22 1308  BNP 2,226.4*    DDimer No results for input(s): "DDIMER" in the last 168 hours.   Radiology    MR BRAIN WO CONTRAST  Result Date: 05/06/2022 CLINICAL DATA:  Stroke follow-up EXAM: MRI HEAD WITHOUT CONTRAST TECHNIQUE: Multiplanar, multiecho pulse sequences of the brain and surrounding structures were obtained without intravenous contrast. COMPARISON:  Head CT from yesterday FINDINGS: Brain: Subcentimeter areas of restricted diffusion in the subcortical left frontal lobe and temporal occipital region. Small area of restricted diffusion in  the posterior right pons. Advanced chronic small vessel ischemia with extensive gliosis and small vessel infarcts affecting the brainstem, deep gray nuclei, and deep white matter tracks. Premature cerebral volume loss. Chronic microhemorrhages with hypertensive pattern. No acute hemorrhage or hydrocephalus.  No masslike finding. Vascular: Major flow voids are preserved Skull and upper cervical spine: Normal marrow signal. 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 Right more than left mastoid opacification with normal nasopharynx. IMPRESSION: 1. Small acute infarcts in the right posterior pons and subcortical left cerebral hemisphere. 2. Background of severe chronic small vessel ischemia. Electronically Signed   By: Jorje Guild M.D.   On: 05/06/2022 05:29   CT HEAD WO CONTRAST (5MM)  Result Date: 05/05/2022 CLINICAL DATA:  Slurred speech EXAM: CT HEAD WITHOUT CONTRAST TECHNIQUE: Contiguous axial images were obtained from the base of the skull through the vertex without intravenous contrast. RADIATION DOSE REDUCTION: This exam was performed according to the departmental dose-optimization program which includes automated exposure control, adjustment of the mA and/or kV according to patient size and/or use of iterative reconstruction technique. COMPARISON:  01/11/2015 FINDINGS: Brain: No evidence of acute infarction, hemorrhage, hydrocephalus, extra-axial collection or mass lesion/mass effect. Subcortical white matter and periventricular small vessel ischemic changes. Chronic lacunar infarcts in the right corona radiata. Vascular: Intracranial atherosclerosis. Skull: Normal. Negative for fracture or focal lesion. Sinuses/Orbits: Postprocedural changes involving the right globe. The visualized paranasal sinuses are essentially clear. The mastoid air cells are unopacified. Other: None. IMPRESSION: No evidence of acute intracranial abnormality. Small vessel ischemic changes. Chronic lacunar infarcts in the right  corona radiata. Electronically Signed   By: Julian Hy M.D.   On: 05/05/2022 21:17   ECHOCARDIOGRAM COMPLETE  Result Date: 05/05/2022    ECHOCARDIOGRAM REPORT   Patient Name:   COLYN MIRON Date of Exam: 05/05/2022 Medical Rec #:  [id number]     Height:       73.0 in Accession #:    [id number]    Weight:       180.0 lb Date of Birth:  August 29, 1965     BSA:          2.057 m Patient Age:    14 years      BP:           143/91 mmHg Patient Gender: M             HR:           74 bpm. Exam Location:  Inpatient Procedure: 2D Echo, Color Doppler and Cardiac Doppler Indications:    M38.46 Acute diastolic (congestive) heart failure  History:        Patient has prior history of Echocardiogram examinations, most  recent 07/17/2020. Risk Factors:Hypertension, Diabetes,                 Dyslipidemia, Sleep Apnea and CKD.  Sonographer:    Raquel Sarna Senior RDCS Referring Phys: [id number] [place]  1. Left ventricular ejection fraction, by estimation, is 45 to 50%. The left ventricle has mildly decreased function. The left ventricle demonstrates regional wall motion abnormalities with basal to mid anterolateral and basal to mid inferior hypokinesis. There is mild concentric left ventricular hypertrophy. Left ventricular diastolic parameters are consistent with Grade II diastolic dysfunction (pseudonormalization).  2. Right ventricular systolic function is normal. The right ventricular size is mildly enlarged. There is moderately elevated pulmonary artery systolic pressure. The estimated right ventricular systolic pressure is 54.0 mmHg.  3. Left atrial size was severely dilated.  4. Right atrial size was mildly dilated.  5. The mitral valve is degenerative. Mild to moderate mitral valve regurgitation. No evidence of mitral stenosis. Moderate mitral annular calcification.  6. The aortic valve is tricuspid. There is mild calcification of the aortic valve. Aortic valve regurgitation is not visualized. No  aortic stenosis is present.  7. The inferior vena cava is dilated in size with <50% respiratory variability, suggesting right atrial pressure of 15 mmHg. FINDINGS  Left Ventricle: Left ventricular ejection fraction, by estimation, is 45 to 50%. The left ventricle has mildly decreased function. The left ventricle demonstrates regional wall motion abnormalities. The left ventricular internal cavity size was normal in size. There is mild concentric left ventricular hypertrophy. Left ventricular diastolic parameters are consistent with Grade II diastolic dysfunction (pseudonormalization). Right Ventricle: The right ventricular size is mildly enlarged. No increase in right ventricular wall thickness. Right ventricular systolic function is normal. There is moderately elevated pulmonary artery systolic pressure. The tricuspid regurgitant velocity is 3.01 m/s, and with an assumed right atrial pressure of 15 mmHg, the estimated right ventricular systolic pressure is 08.6 mmHg. Left Atrium: Left atrial size was severely dilated. Right Atrium: Right atrial size was mildly dilated. Pericardium: Trivial pericardial effusion is present. Mitral Valve: The mitral valve is degenerative in appearance. There is moderate calcification of the mitral valve leaflet(s). Moderate mitral annular calcification. Mild to moderate mitral valve regurgitation. No evidence of mitral valve stenosis. MV peak gradient, 6.9 mmHg. The mean mitral valve gradient is 3.0 mmHg. Tricuspid Valve: The tricuspid valve is normal in structure. Tricuspid valve regurgitation is trivial. Aortic Valve: The aortic valve is tricuspid. There is mild calcification of the aortic valve. Aortic valve regurgitation is not visualized. No aortic stenosis is present. Aortic valve mean gradient measures 6.0 mmHg. Aortic valve peak gradient measures 13.2 mmHg. Aortic valve area, by VTI measures 2.16 cm. Pulmonic Valve: The pulmonic valve was normal in structure. Pulmonic valve  regurgitation is not visualized. Aorta: The aortic root is normal in size and structure. Venous: The inferior vena cava is dilated in size with less than 50% respiratory variability, suggesting right atrial pressure of 15 mmHg. IAS/Shunts: No atrial level shunt detected by color flow Doppler.  LEFT VENTRICLE PLAX 2D LVIDd:         5.60 cm      Diastology LVIDs:         4.30 cm      LV e' medial:    4.23 cm/s LV PW:         1.50 cm      LV E/e' medial:  26.7 LV IVS:        1.10 cm  LV e' lateral:   7.14 cm/s LVOT diam:     1.80 cm      LV E/e' lateral: 15.8 LV SV:         75 LV SV Index:   37 LVOT Area:     2.54 cm  LV Volumes (MOD) LV vol d, MOD A2C: 173.0 ml LV vol d, MOD A4C: 182.0 ml LV vol s, MOD A2C: 88.1 ml LV vol s, MOD A4C: 84.7 ml LV SV MOD A2C:     84.9 ml LV SV MOD A4C:     182.0 ml LV SV MOD BP:      88.4 ml RIGHT VENTRICLE RV S prime:     12.40 cm/s TAPSE (M-mode): 2.4 cm LEFT ATRIUM              Index        RIGHT ATRIUM           Index LA diam:        5.30 cm  2.58 cm/m   RA Area:     21.80 cm LA Vol (A2C):   121.0 ml 58.81 ml/m  RA Volume:   66.30 ml  32.23 ml/m LA Vol (A4C):   126.0 ml 61.24 ml/m LA Biplane Vol: 127.0 ml 61.73 ml/m  AORTIC VALVE AV Area (Vmax):    1.85 cm AV Area (Vmean):   2.27 cm AV Area (VTI):     2.16 cm AV Vmax:           182.00 cm/s AV Vmean:          112.000 cm/s AV VTI:            0.349 m AV Peak Grad:      13.2 mmHg AV Mean Grad:      6.0 mmHg LVOT Vmax:         132.00 cm/s LVOT Vmean:        99.900 cm/s LVOT VTI:          0.296 m LVOT/AV VTI ratio: 0.85  AORTA Ao Root diam: 3.20 cm Ao Asc diam:  3.00 cm MITRAL VALVE                  TRICUSPID VALVE MV Area (PHT): 4.71 cm       TR Peak grad:   36.2 mmHg MV Area VTI:   2.22 cm       TR Vmax:        301.00 cm/s MV Peak grad:  6.9 mmHg MV Mean grad:  3.0 mmHg       SHUNTS MV Vmax:       1.31 m/s       Systemic VTI:  0.30 m MV Vmean:      81.0 cm/s      Systemic Diam: 1.80 cm MV Decel Time: 161 msec MR Peak  grad:    78.9 mmHg MR Mean grad:    43.0 mmHg MR Vmax:         444.00 cm/s MR Vmean:        320.0 cm/s MR PISA:         3.08 cm MR PISA Eff ROA: 21 mm MR PISA Radius:  0.70 cm MV E velocity: 113.00 cm/s MV A velocity: 40.50 cm/s MV E/A ratio:  2.79 Dalton McleanMD Electronically signed by Franki Monte Signature Date/Time: 05/05/2022/10:58:03 AM    Final    DG Chest 2 View  Result Date: 05/04/2022 CLINICAL DATA:  cp EXAM:  CHEST - 2 VIEW COMPARISON:  03/29/2012 FINDINGS: Cardiac silhouette enlarged. No evidence of pneumothorax or pleural effusion. No evidence of pulmonary edema. Aorta is calcified. No osseous abnormalities identified. IMPRESSION: Enlarged cardiac silhouette. Electronically Signed   By: Sammie Bench M.D.   On: 05/04/2022 13:19    Cardiac Studies   05/05/22 TTE  IMPRESSIONS     1. Left ventricular ejection fraction, by estimation, is 45 to 50%. The  left ventricle has mildly decreased function. The left ventricle  demonstrates regional wall motion abnormalities with basal to mid  anterolateral and basal to mid inferior  hypokinesis. There is mild concentric left ventricular hypertrophy. Left  ventricular diastolic parameters are consistent with Grade II diastolic  dysfunction (pseudonormalization).   2. Right ventricular systolic function is normal. The right ventricular  size is mildly enlarged. There is moderately elevated pulmonary artery  systolic pressure. The estimated right ventricular systolic pressure is  03.5 mmHg.   3. Left atrial size was severely dilated.   4. Right atrial size was mildly dilated.   5. The mitral valve is degenerative. Mild to moderate mitral valve  regurgitation. No evidence of mitral stenosis. Moderate mitral annular  calcification.   6. The aortic valve is tricuspid. There is mild calcification of the  aortic valve. Aortic valve regurgitation is not visualized. No aortic  stenosis is present.   7. The inferior vena cava is  dilated in size with <50% respiratory  variability, suggesting right atrial pressure of 15 mmHg.   FINDINGS   Left Ventricle: Left ventricular ejection fraction, by estimation, is 45  to 50%. The left ventricle has mildly decreased function. The left  ventricle demonstrates regional wall motion abnormalities. The left  ventricular internal cavity size was normal  in size. There is mild concentric left ventricular hypertrophy. Left  ventricular diastolic parameters are consistent with Grade II diastolic  dysfunction (pseudonormalization).   Right Ventricle: The right ventricular size is mildly enlarged. No  increase in right ventricular wall thickness. Right ventricular systolic  function is normal. There is moderately elevated pulmonary artery systolic  pressure. The tricuspid regurgitant  velocity is 3.01 m/s, and with an assumed right atrial pressure of 15  mmHg, the estimated right ventricular systolic pressure is 59.7 mmHg.   Left Atrium: Left atrial size was severely dilated.   Right Atrium: Right atrial size was mildly dilated.   Pericardium: Trivial pericardial effusion is present.   Mitral Valve: The mitral valve is degenerative in appearance. There is  moderate calcification of the mitral valve leaflet(s). Moderate mitral  annular calcification. Mild to moderate mitral valve regurgitation. No  evidence of mitral valve stenosis. MV  peak gradient, 6.9 mmHg. The mean mitral valve gradient is 3.0 mmHg.   Tricuspid Valve: The tricuspid valve is normal in structure. Tricuspid  valve regurgitation is trivial.   Aortic Valve: The aortic valve is tricuspid. There is mild calcification  of the aortic valve. Aortic valve regurgitation is not visualized. No  aortic stenosis is present. Aortic valve mean gradient measures 6.0 mmHg.  Aortic valve peak gradient measures  13.2 mmHg. Aortic valve area, by VTI measures 2.16 cm.   Pulmonic Valve: The pulmonic valve was normal in  structure. Pulmonic valve  regurgitation is not visualized.   Aorta: The aortic root is normal in size and structure.   Venous: The inferior vena cava is dilated in size with less than 50%  respiratory variability, suggesting right atrial pressure of 15 mmHg.   IAS/Shunts: No atrial  level shunt detected by color flow Doppler.   Patient Profile   Andrick Rust is a 56 y.o. male with a hx of CKD5, HTN, diastolic CHF, GERD, DM, diabetic retinopathy, latent TB who is being seen 05/05/2022 for the evaluation of chest pain at the request of Dr. Marylyn Ishihara.   Assessment & Plan    Acute on chronic diastolic CHF   Patient admitted in the setting of intermittent chest pain and orthopnea. BNP elevated to 2226.4, enlarged cardiac silhouette on CXR. TTE notable for LVEF 45-50%, basal to mid anterolateral and basal to mid inferior hypokinesis, grade II diastolic dysfunction. RV pressure elevated at 51.2 mmHg.   Despite elevated BNP, patient is without significant peripheral edema on physical exam.  TTE with evidence of RV overload. Patient remains reluctantly agreeable to trying dialysis. Volume management to nephrology team.   Chest pain   Patient reporting several months of intermittent and atypical chest pain primarily associated with orthopnea. Pain is sometimes on the left side of his chest, sometimes on the right. His history is notable for a stress test in February 2022 that showed a moderate size region of moderate decreased counts in the mid and basilar segment of the inferior wall. Ultimately ischemic evaluation was deferred given lack of symptoms at that time and CKD stage V. Troponins checked this admission: 221, 220, 190, 246   Troponin trend this admission not consistent with ACS, more likely demand ischemia with acute anemia. This may also be source of patient's chronic chest pain. ECG is without acute ischemic changes and patient is without chest pain at this time.  Given multiple risk  factors for CAD, it would not be unreasonable to reconsider cardiac catheterization at some point now that patient is agreeable to dialysis. Unfortunately now that patient has been found with small acute cerebral infarcts, we will need to defer cardiac catheterization to avoid possible hemorrhagic conversion.  Hypertension   Poorly controlled. Patient reportedly on home regimen of Clonidine and possibly Hydralazine. This is an interesting combination and yesterday patient was switched from Clonidine to Coreg. Now with acute cerebral infarct has anti-hypertensive regimen held for permissive hypertension.   Acute CVA  Patient noted with neurological deficits yesterday, found with small acute infarcts in the right posterior pons and subcortical left cerebral hemisphere. Cardiac catheterization will need to be deferred due to hemorrhagic conversion risk. Management per primary team/neurology. Permission hypertension x48 hours.   Acute anemia   Patient with CKD stage V found with HGB down to 7.0, now improved to 8.7. From a cardiac perspective would prefer to maintain HGB >8.0.       CKD stage V  Patient continues to be reluctantly agreeable to trying one dialysis session.         For questions or updates, please contact [place] Please consult [email or web address] for contact info under        Signed, Lily Kocher, PA-C  05/06/2022, 8:40 AM    Patient seen and examined and agree with Lily Kocher, PA-C as detailed above.   In brief, the patient is a 56 year old male with history of CKD V, HTN, chronic HFpEF, DMII, and latent TB who presented to [facility] with chest pain for which Cardiology was consulted.    Patient has been followed by Dr. Ellyn Hack as outpatient. Had stress testing in 07/2017 which noted moderate size region of moderate ischemia in the inferior basilar segment of inferior wall. TTE at that time showed EF 55-60%, moderate LVH, grade  I DD. Given lack of anginal  symptoms and patient reluctance to dialysis, medical management was recommended over cardiac catheterization.    He presents on this admission with intermittent chest pain and orthopnea. Trop 221>220>190>246. Cr 10 not on HD. ECG with NSR with no acute ischemic changes. BNP 2226.4. TTE 05/05/22 with LVEF 45-50%, anterolateral and basal to mid inferior hypokinesis, moderate pulmonary HTN but preserved RV function, severe LAE, mild RAE, mild to moderate MR, moderate MAC. Initially he was declining HD but now has become amenable.  Overnight, the patient developed dysarthria with MRI brain revealing small acute infarcts in the right posterior ponds and subcortical left cerebral hemisphere. Neuro has been consulted.    Overall, suspect his symptoms are mainly driven by acute on chronic combined systolic and diastolic HF in the setting of CKD V. He is amenable to a session of HD, but is unsure if he will continue long-term. He does have known ischemia on lexiscan in 2019 and a new drop in EF with WMA on echo as described above. Given acute stroke, will defer ischemic testing at this time. If he decides to continue with HD, we can plan for ischemic evaluation in the future.   GEN: No acute distress.   Neck: JVD to the angle of the mandible Cardiac: RRR, 3/6 murmur in setting of LUE AV fistula Respiratory: Crackles at bases bilaterally GI: Soft, nontender, non-distended  MS: No edema; No deformity. Neuro:  Dysarthric Psych: Normal affect     Plan: -Once recovered from a neurologic standpoint AND if he wishes to continue with HD going forward, can plan for ischemic evaluation at that time as he likely has significant CAD -Patient is amenable to HD today; hopefully will choose to continue -Continue ASA 13m daily -Antihypertensives held due to acute stroke; management per Neuro   HGwyndolyn Kaufman MD

## 2022-05-06 NOTE — Progress Notes (Signed)
Carotid duplex and TCD (limited) have been completed.   Results can be found under chart review under CV PROC. 05/06/2022 5:26 PM Varsha Knock RVT, RDMS

## 2022-05-06 NOTE — Progress Notes (Signed)
Pt was seen in [place].  Attempted cannulation of LUE AVF, however infiltrated and unable to use.  VVS curbsided but can't get to it until next week.  Will ask IR to place [facility] in am, pt is agreeable for now.

## 2022-05-07 ENCOUNTER — Encounter (HOSPITAL_COMMUNITY): Payer: Self-pay | Admitting: Internal Medicine

## 2022-05-07 ENCOUNTER — Encounter (HOSPITAL_COMMUNITY): Payer: Medicaid Other

## 2022-05-07 ENCOUNTER — Inpatient Hospital Stay (HOSPITAL_COMMUNITY): Payer: Medicaid Other

## 2022-05-07 DIAGNOSIS — Z7189 Other specified counseling: Secondary | ICD-10-CM

## 2022-05-07 DIAGNOSIS — Z66 Do not resuscitate: Secondary | ICD-10-CM

## 2022-05-07 DIAGNOSIS — Z452 Encounter for adjustment and management of vascular access device: Secondary | ICD-10-CM | POA: Diagnosis not present

## 2022-05-07 DIAGNOSIS — I25110 Atherosclerotic heart disease of native coronary artery with unstable angina pectoris: Secondary | ICD-10-CM | POA: Diagnosis not present

## 2022-05-07 DIAGNOSIS — R079 Chest pain, unspecified: Secondary | ICD-10-CM | POA: Diagnosis not present

## 2022-05-07 DIAGNOSIS — I2489 Other forms of acute ischemic heart disease: Secondary | ICD-10-CM | POA: Diagnosis not present

## 2022-05-07 DIAGNOSIS — I509 Heart failure, unspecified: Secondary | ICD-10-CM | POA: Diagnosis not present

## 2022-05-07 DIAGNOSIS — N186 End stage renal disease: Secondary | ICD-10-CM | POA: Diagnosis not present

## 2022-05-07 DIAGNOSIS — I132 Hypertensive heart and chronic kidney disease with heart failure and with stage 5 chronic kidney disease, or end stage renal disease: Secondary | ICD-10-CM | POA: Diagnosis not present

## 2022-05-07 DIAGNOSIS — R0602 Shortness of breath: Secondary | ICD-10-CM | POA: Diagnosis not present

## 2022-05-07 DIAGNOSIS — Z515 Encounter for palliative care: Secondary | ICD-10-CM | POA: Diagnosis not present

## 2022-05-07 DIAGNOSIS — I5021 Acute systolic (congestive) heart failure: Secondary | ICD-10-CM

## 2022-05-07 DIAGNOSIS — I6302 Cerebral infarction due to thrombosis of basilar artery: Secondary | ICD-10-CM

## 2022-05-07 DIAGNOSIS — I6381 Other cerebral infarction due to occlusion or stenosis of small artery: Secondary | ICD-10-CM | POA: Diagnosis not present

## 2022-05-07 DIAGNOSIS — E875 Hyperkalemia: Secondary | ICD-10-CM | POA: Diagnosis not present

## 2022-05-07 DIAGNOSIS — I5041 Acute combined systolic (congestive) and diastolic (congestive) heart failure: Secondary | ICD-10-CM | POA: Diagnosis not present

## 2022-05-07 DIAGNOSIS — Z1152 Encounter for screening for COVID-19: Secondary | ICD-10-CM | POA: Diagnosis not present

## 2022-05-07 DIAGNOSIS — Z992 Dependence on renal dialysis: Secondary | ICD-10-CM | POA: Diagnosis not present

## 2022-05-07 DIAGNOSIS — I272 Pulmonary hypertension, unspecified: Secondary | ICD-10-CM | POA: Diagnosis not present

## 2022-05-07 DIAGNOSIS — I200 Unstable angina: Secondary | ICD-10-CM | POA: Diagnosis not present

## 2022-05-07 DIAGNOSIS — D649 Anemia, unspecified: Secondary | ICD-10-CM | POA: Diagnosis not present

## 2022-05-07 DIAGNOSIS — E872 Acidosis, unspecified: Secondary | ICD-10-CM | POA: Diagnosis not present

## 2022-05-07 DIAGNOSIS — N185 Chronic kidney disease, stage 5: Secondary | ICD-10-CM | POA: Diagnosis not present

## 2022-05-07 DIAGNOSIS — I700 Atherosclerosis of aorta: Secondary | ICD-10-CM | POA: Diagnosis not present

## 2022-05-07 DIAGNOSIS — I517 Cardiomegaly: Secondary | ICD-10-CM | POA: Diagnosis not present

## 2022-05-07 DIAGNOSIS — D631 Anemia in chronic kidney disease: Secondary | ICD-10-CM | POA: Diagnosis not present

## 2022-05-07 DIAGNOSIS — I5042 Chronic combined systolic (congestive) and diastolic (congestive) heart failure: Secondary | ICD-10-CM | POA: Diagnosis not present

## 2022-05-07 DIAGNOSIS — R072 Precordial pain: Secondary | ICD-10-CM | POA: Diagnosis not present

## 2022-05-07 LAB — RENAL FUNCTION PANEL
Albumin: 3.4 g/dL — ABNORMAL LOW (ref 3.5–5.0)
Anion gap: 11 (ref 5–15)
BUN: 57 mg/dL — ABNORMAL HIGH (ref 6–20)
CO2: 17 mmol/L — ABNORMAL LOW (ref 22–32)
Calcium: 8.5 mg/dL — ABNORMAL LOW (ref 8.9–10.3)
Chloride: 111 mmol/L (ref 98–111)
Creatinine, Ser: 10.45 mg/dL — ABNORMAL HIGH (ref 0.61–1.24)
GFR, Estimated: 5 mL/min — ABNORMAL LOW (ref >60)
Glucose, Bld: 96 mg/dL (ref 70–99)
Phosphorus: 5.6 mg/dL — ABNORMAL HIGH (ref 2.5–4.6)
Potassium: 4.8 mmol/L (ref 3.5–5.1)
Sodium: 139 mmol/L (ref 135–145)

## 2022-05-07 LAB — HEPATITIS B SURFACE ANTIBODY, QUANTITATIVE: Hep B S AB Quant (Post): 5.1 m[IU]/mL — ABNORMAL LOW (ref >9.9)

## 2022-05-07 MED ORDER — LABETALOL HCL 5 MG/ML IV SOLN: 10.0000 mg | INTRAVENOUS | Status: DC | PRN

## 2022-05-07 MED ORDER — HYDRALAZINE HCL 20 MG/ML IJ SOLN: 10.0000 mg | INTRAMUSCULAR | Status: DC | PRN

## 2022-05-07 MED ORDER — CLOPIDOGREL BISULFATE 75 MG PO TABS
75.0000 mg | ORAL_TABLET | Freq: Every day | ORAL | Status: DC
  Administered 2022-05-08 – 2022-05-12 (×5): 75 mg via ORAL
  Filled 2022-05-07 (×5): qty 1

## 2022-05-07 MED ORDER — CHLORHEXIDINE GLUCONATE CLOTH 2 % EX PADS
6.0000 | MEDICATED_PAD | Freq: Every day | CUTANEOUS | Status: DC
  Administered 2022-05-07 – 2022-05-12 (×4): 6 via TOPICAL

## 2022-05-07 MED ORDER — ALUM & MAG HYDROXIDE-SIMETH 200-200-20 MG/5 ML NICU TOPICAL
1.0000 | Freq: Once | TOPICAL | Status: DC
  Filled 2022-05-07: qty 355

## 2022-05-07 MED ORDER — HYDROXYZINE HCL 10 MG PO TABS
10.0000 mg | ORAL_TABLET | Freq: Three times a day (TID) | ORAL | Status: DC
  Administered 2022-05-07 – 2022-05-11 (×13): 10 mg via ORAL
  Filled 2022-05-07 (×13): qty 1

## 2022-05-07 MED ORDER — ALUM & MAG HYDROXIDE-SIMETH 200-200-20 MG/5ML PO SUSP
30.0000 mL | Freq: Once | ORAL | Status: AC
  Administered 2022-05-07: 30 mL via ORAL
  Filled 2022-05-07: qty 30

## 2022-05-07 NOTE — Progress Notes (Signed)
PROGRESS NOTE    Cody Webster  VEL:[id number] DOB: [birth year] DOA: 05/04/2022 PCP: Kerin Perna, NP   Brief Narrative:  Cody Webster is a 56 y.o. male with medical history significant of CKD5, HTN, GERD, latent TB. Presented with chest pain. He reports that he was on medication for latent TB, but he stopped because he thought it was making him sick. No other complaint.  Admitted to hospital service at [facility].  Cardiology and nephrology consulted.  Nephrology recommended hemodialysis, he has functional LUE AVF but patient refused dialysis in the beginning but eventually agreed and he was transferred to [facility] for that reason.  Cardiology also followed him.  They are recommending cardiac cath at some point in time during this hospitalization.  On the evening of 05/05/2022, patient was noted to have dysarthria and facial droop and code stroke was called.  MRI shows acute infarct in right posterior pons and subcortical left cerebral hemisphere.  Neurology consulted.  Assessment & Plan:   Principal Problem:   Chest pain Active Problems:   Essential hypertension   CKD (chronic kidney disease) stage 5, GFR less than 15 ml/min (HCC)   Symptomatic anemia   TB lung, latent   Metabolic acidosis   CKD (chronic kidney disease), stage V (HCC)   Acute ischemic stroke (HCC)   Acute congestive heart failure (HCC)  Chest pain: Troponin elevated but flat.  Echo shows slightly reduced LVEF, previously it was 50 to 55% and now it is 45 to 50%.  Also has regional wall motion from release with hypokinesis.  Grade 2 diastolic dysfunction.  Cardiology on board and plans for cardiac cath once stable and cleared from neurology.  CKD stage V/hyperphosphatemia/hyperkalemia/metabolic acidosis/uremia: Nephrology on board, patient has nonfunctioning LUE AVF.  He needs another surgery for that, vascular surgery has been consulted, they plan to do second stage either here next week or as  outpatient, plans are unclear.  Patient is willing to get hemodialysis.  Nephrology consulted IR for temporary catheter which we are waiting for.  Patient is frustrated this morning because MRI tech came to take him and patient believes that the hospital staff is taking too long to do the catheter and he wants to get the catheter first and thus he did not go for MRI.    Acute ischemic stroke: MRI report shows acute ischemic stroke.  He is on aspirin.  I started him on atorvastatin.  Will defer to neurology if they would like to start him on DAPT.  Patient is still has dysarthria and left facial droop but no other focal deficit.  Management per neurology.  Anemia of chronic disease: Patient's hemoglobin dropped to 7 on 05/04/2022 and he received 1 unit of PRBC transfusion.  Hemoglobin improved.  Essential hypertension:  Patient was on Coreg and hydralazine.  In order to allow permissive hypertension for at least 48 hours, both of these medications were discontinued, blood pressure elevated but acceptable.  Will place him on as needed labetalol and hydralazine to be given if systolic blood pressure over 258 or diastolic over 527.  Will slowly resume medications starting tomorrow.  Latent TB: Patient stopped taking medications and he is refusing again.  He has the capacity to make decisions for himself.  DVT prophylaxis: SCDs Start: 05/04/22 2227   Code Status: DNR  Family Communication:  None present at bedside.  Plan of care discussed with patient in length and he/she verbalized understanding and agreed with it.  Status is: Inpatient  Remains inpatient appropriate because: Needs dialysis and further management for stroke.   Estimated body mass index is 22.54 kg/m as calculated from the following:   Height as of this encounter: '6\' 1"'$  (1.854 m).   Weight as of this encounter: 77.5 kg.    Nutritional Assessment: Body mass index is 22.54 kg/m.Marland Kitchen Seen by dietician.  I agree with the assessment and  plan as outlined below: Nutrition Status:        . Skin Assessment: I have examined the patient's skin and I agree with the wound assessment as performed by the wound care RN as outlined below:    Consultants:  Nephrology Cardiology Neurology   Procedures:  As above  Antimicrobials:  Anti-infectives (From admission, onward)    None         Subjective: Patient seen and examined this morning.  MRI tech came to take him for the MR angiogram of the head and neck but patient was frustrated and wanted his HD catheter first and thus he declined MRI for now.  Objective: Vitals:   05/07/22 0000 05/07/22 0600 05/07/22 0700 05/07/22 0800  BP: (!) 175/76 (!) 158/66 (!) 139/55 (!) 184/100  Pulse: 100 90 88 98  Resp: '20 10 11   '$ Temp: 98.5 F (36.9 C) 98.2 F (36.8 C)    TempSrc: Oral Oral    SpO2: 94% 93% 95% 95%  Weight:      Height:        Intake/Output Summary (Last 24 hours) at 05/07/2022 1013 Last data filed at 05/06/2022 2201 Gross per 24 hour  Intake --  Output 200 ml  Net -200 ml    Filed Weights   05/04/22 1235 05/05/22 2300  Weight: 81.6 kg 77.5 kg    Examination:  General exam: Appears frustrated and agitated. Respiratory system: Clear to auscultation. Respiratory effort normal. Cardiovascular system: S1 & S2 heard, RRR. No JVD, murmurs, rubs, gallops or clicks. No pedal edema. Gastrointestinal system: Abdomen is nondistended, soft and nontender. No organomegaly or masses felt. Normal bowel sounds heard. Central nervous system: Alert and oriented.  Dysarthria with left facial droop and 4/5 power in right lower extremity. Extremities: Symmetric 5 x 5 power. Skin: No rashes, lesions or ulcers.   Data Reviewed: I have personally reviewed following labs and imaging studies  CBC: Recent Labs  Lab 05/04/22 1319 05/05/22 0025 05/06/22 0026  WBC 4.2 4.2 4.5  HGB 7.0* 7.3* 8.7*  HCT 22.8* 23.2* 27.4*  MCV 101.8* 100.4* 97.2  PLT 187 191 178     Basic Metabolic Panel: Recent Labs  Lab 05/04/22 1319 05/04/22 2251 05/05/22 0025 05/06/22 0026 05/07/22 0029  NA 137 135 137 138 139  K 5.5* 5.0 4.8 4.8 4.8  CL 109 108 109 110 111  CO2 20* 18* 19* 18* 17*  GLUCOSE 83 106* 84 88 96  BUN 54* 56* 58* 59* 57*  CREATININE 9.84* 9.59* 9.72* 10.19* 10.45*  CALCIUM 8.3* 7.9* 7.9* 8.1* 8.5*  PHOS  --  5.4* 5.5* 5.7* 5.6*    GFR: Estimated Creatinine Clearance: 8.4 mL/min (A) (by C-G formula based on SCr of 10.45 mg/dL (H)). Liver Function Tests: Recent Labs  Lab 05/04/22 2251 05/05/22 0025 05/06/22 0026 05/07/22 0029  ALBUMIN 3.2* 3.0* 3.0* 3.4*    No results for input(s): "LIPASE", "AMYLASE" in the last 168 hours. No results for input(s): "AMMONIA" in the last 168 hours. Coagulation Profile: No results for input(s): "INR", "PROTIME" in the last 168 hours. Cardiac Enzymes: No  results for input(s): "CKTOTAL", "CKMB", "CKMBINDEX", "TROPONINI" in the last 168 hours. BNP (last 3 results) No results for input(s): "PROBNP" in the last 8760 hours. HbA1C: No results for input(s): "HGBA1C" in the last 72 hours. CBG: Recent Labs  Lab 05/05/22 2138  GLUCAP 105*    Lipid Profile: Recent Labs    05/05/22 0025  CHOL 156  HDL 34*  LDLCALC 107*  TRIG 76  CHOLHDL 4.6    Thyroid Function Tests: No results for input(s): "TSH", "T4TOTAL", "FREET4", "T3FREE", "THYROIDAB" in the last 72 hours. Anemia Panel: Recent Labs    05/04/22 2305  FERRITIN 175  TIBC 192*  IRON 37*    Sepsis Labs: No results for input(s): "PROCALCITON", "LATICACIDVEN" in the last 168 hours.  Recent Results (from the past 240 hour(s))  Resp panel by RT-PCR (RSV, Flu A&B, Covid) Anterior Nasal Swab     Status: None   Collection Time: 05/04/22  1:02 PM   Specimen: Anterior Nasal Swab  Result Value Ref Range Status   SARS Coronavirus 2 by RT PCR NEGATIVE NEGATIVE Final    Comment: (NOTE) SARS-CoV-2 target nucleic acids are NOT  DETECTED.  The SARS-CoV-2 RNA is generally detectable in upper respiratory specimens during the acute phase of infection. The lowest concentration of SARS-CoV-2 viral copies this assay can detect is 138 copies/mL. A negative result does not preclude SARS-Cov-2 infection and should not be used as the sole basis for treatment or other patient management decisions. A negative result may occur with  improper specimen collection/handling, submission of specimen other than nasopharyngeal swab, presence of viral mutation(s) within the areas targeted by this assay, and inadequate number of viral copies(<138 copies/mL). A negative result must be combined with clinical observations, patient history, and epidemiological information. The expected result is Negative.  Fact Sheet for Patients:  EntrepreneurPulse.com.au  Fact Sheet for Healthcare Providers:  IncredibleEmployment.be  This test is no t yet approved or cleared by the [place] FDA and  has been authorized for detection and/or diagnosis of SARS-CoV-2 by FDA under an Emergency Use Authorization (EUA). This EUA will remain  in effect (meaning this test can be used) for the duration of the COVID-19 declaration under Section 564(b)(1) of the Act, 21 U.S.C.section 360bbb-3(b)(1), unless the authorization is terminated  or revoked sooner.       Influenza A by PCR NEGATIVE NEGATIVE Final   Influenza B by PCR NEGATIVE NEGATIVE Final    Comment: (NOTE) The Xpert Xpress SARS-CoV-2/FLU/RSV plus assay is intended as an aid in the diagnosis of influenza from Nasopharyngeal swab specimens and should not be used as a sole basis for treatment. Nasal washings and aspirates are unacceptable for Xpert Xpress SARS-CoV-2/FLU/RSV testing.  Fact Sheet for Patients: EntrepreneurPulse.com.au  Fact Sheet for Healthcare Providers: IncredibleEmployment.be  This test is not yet  approved or cleared by the [place] FDA and has been authorized for detection and/or diagnosis of SARS-CoV-2 by FDA under an Emergency Use Authorization (EUA). This EUA will remain in effect (meaning this test can be used) for the duration of the COVID-19 declaration under Section 564(b)(1) of the Act, 21 U.S.C. section 360bbb-3(b)(1), unless the authorization is terminated or revoked.     Resp Syncytial Virus by PCR NEGATIVE NEGATIVE Final    Comment: (NOTE) Fact Sheet for Patients: EntrepreneurPulse.com.au  Fact Sheet for Healthcare Providers: IncredibleEmployment.be  This test is not yet approved or cleared by the [place] FDA and has been authorized for detection and/or diagnosis of SARS-CoV-2 by FDA  under an Emergency Use Authorization (EUA). This EUA will remain in effect (meaning this test can be used) for the duration of the COVID-19 declaration under Section 564(b)(1) of the Act, 21 U.S.C. section 360bbb-3(b)(1), unless the authorization is terminated or revoked.  Performed at [facility], [place] [address], [place], [place] [address]   MRSA Next Gen by PCR, Nasal     Status: None   Collection Time: 05/05/22 11:41 PM   Specimen: Nasal Mucosa; Nasal Swab  Result Value Ref Range Status   MRSA by PCR Next Gen NOT DETECTED NOT DETECTED Final    Comment: (NOTE) The GeneXpert MRSA Assay (FDA approved for NASAL specimens only), is one component of a comprehensive MRSA colonization surveillance program. It is not intended to diagnose MRSA infection nor to guide or monitor treatment for MRSA infections. Test performance is not FDA approved in patients less than 27 years old. Performed at [facility], [place] [address] [place], [place] [address]      Radiology Studies: VAS [place] TRANSCRANIAL DOPPLER  Result Date: 05/07/2022  Transcranial Doppler Patient Name:  AKIM WATKINSON  Date of Exam:   05/06/2022  Medical Rec #: [id number]      Accession #:    [id number] Date of Birth: 1965-06-12      Patient Gender: M Patient Age:   59 years Exam Location:  [facility] Procedure:      VAS [place] TRANSCRANIAL DOPPLER Referring Phys: Elwin Sleight DE LA TORRE --------------------------------------------------------------------------------  Indications: Stroke. History: HTN, HLD, SMK, DM, CHF, CKD, CVA. 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 Comparison Study: No previous exams Performing Technologist: Jody Hill RVT, RDMS  Examination Guidelines: A complete evaluation includes B-mode imaging, spectral Doppler, color Doppler, and power Doppler as needed of all accessible portions of each vessel. Bilateral testing is considered an integral part of a complete examination. Limited examinations for reoccurring indications may be performed as noted.  +----------+---------------+----------+-----------+-------------+ RIGHT TCD Right VM (cm/s)Depth (cm)Pulsatility   Comment    +----------+---------------+----------+-----------+-------------+ MCA                                           not insonated +----------+---------------+----------+-----------+-------------+ ACA                                           not insonated +----------+---------------+----------+-----------+-------------+ Term ICA                                      not insonated +----------+---------------+----------+-----------+-------------+ PCA P1                                        not insonated +----------+---------------+----------+-----------+-------------+ Opthalmic                                     not insonated +----------+---------------+----------+-----------+-------------+ ICA siphon  not insonated  +----------+---------------+----------+-----------+-------------+ Vertebral       -70                   1.11                  +----------+---------------+----------+-----------+-------------+  +----------+--------------+----------+-----------+-------------+ LEFT TCD  Left VM (cm/s)Depth (cm)Pulsatility   Comment    +----------+--------------+----------+-----------+-------------+ MCA             29                   0.98                  +----------+--------------+----------+-----------+-------------+ ACA            -24                   0.88                  +----------+--------------+----------+-----------+-------------+ Term ICA                                     not insonated +----------+--------------+----------+-----------+-------------+ PCA P1                                       not insonated +----------+--------------+----------+-----------+-------------+ Opthalmic       14                   0.88                  +----------+--------------+----------+-----------+-------------+ ICA siphon      21                   2.00                  +----------+--------------+----------+-----------+-------------+ Vertebral      -31                   1.06                  +----------+--------------+----------+-----------+-------------+ Distal ICA                                   not insonated +----------+--------------+----------+-----------+-------------+  +------------+-------+-------+             VM cm/sComment +------------+-------+-------+ Prox Basilar  -77   1.12   +------------+-------+-------+ Dist Basilar  -75   1.15   +------------+-------+-------+ Summary:  Absent right temporal and poor biorbital and left temporalwindows limit evaluation of anterior circulation vessels.Mildly elevated mean flow velocities in bilateral vertebral and basilar arteries of unc;lear significance. *See table(s) above for TCD measurements and observations.   Diagnosing physician: Antony Contras MD Electronically signed by Antony Contras MD on 05/07/2022 at 9:14:15 AM.    Final    VAS US CAROTID  Result Date: 05/06/2022 Carotid Arterial Duplex Study Patient Name:  AARIN BLUETT  Date of Exam:   05/06/2022 Medical Rec #: [id number]      Accession #:    [id number] Date of Birth: [birth year]      Patient Gender: M Patient Age:   51 years Exam Location:  [facility] Procedure:      VAS US CAROTID Referring Phys: Elwin Sleight DE LA TORRE --------------------------------------------------------------------------------  Indications:   CVA. Risk Factors:  Hypertension, hyperlipidemia, Diabetes, current smoker, prior                CVA. Other Factors: CHF, CKD. Limitations    Today's exam was limited due to heavy calcification and the                resulting shadowing and patient movement. Performing Technologist: Rogelia Rohrer RVT, RDMS  Examination Guidelines: A complete evaluation includes B-mode imaging, spectral Doppler, color Doppler, and power Doppler as needed of all accessible portions of each vessel. Bilateral testing is considered an integral part of a complete examination. Limited examinations for reoccurring indications may be performed as noted.  Right Carotid Findings: +----------+--------+--------+--------+------------------+---------+           PSV cm/sEDV cm/sStenosisPlaque DescriptionComments  +----------+--------+--------+--------+------------------+---------+ CCA Prox  120     18              calcific          Shadowing +----------+--------+--------+--------+------------------+---------+ CCA Distal148     25              calcific          Shadowing +----------+--------+--------+--------+------------------+---------+ ICA Prox  149     35      40-59%  calcific          Shadowing +----------+--------+--------+--------+------------------+---------+ ICA Mid   122     28              calcific          Shadowing  +----------+--------+--------+--------+------------------+---------+ ICA Distal85      19                                          +----------+--------+--------+--------+------------------+---------+ ECA       171                     calcific          shadowing +----------+--------+--------+--------+------------------+---------+ +----------+--------+-------+----------------+-------------------+           PSV cm/sEDV cmsDescribe        Arm Pressure (mmHG) +----------+--------+-------+----------------+-------------------+ [id number]            Multiphasic, WNL                    +----------+--------+-------+----------------+-------------------+ +---------+--------+--+--------+--+---------+ VertebralPSV cm/s75EDV cm/s23Antegrade +---------+--------+--+--------+--+---------+  Left Carotid Findings: +----------+-------+--------+--------+-----------------------+-----------------+           PSV    EDV cm/sStenosisPlaque Description     Comments                    cm/s                                                            +----------+-------+--------+--------+-----------------------+-----------------+ CCA Prox  106    22                                     intimal  thickening        +----------+-------+--------+--------+-----------------------+-----------------+ CCA Distal113    21              heterogenous and                                                          calcific                                 +----------+-------+--------+--------+-----------------------+-----------------+ ICA Prox  138    39      40-59%  calcific               Shadowing         +----------+-------+--------+--------+-----------------------+-----------------+ ICA Mid   100    24              calcific               Shadowing          +----------+-------+--------+--------+-----------------------+-----------------+ ICA Distal90     23                                                       +----------+-------+--------+--------+-----------------------+-----------------+ ECA       244            >50%    calcific               shadowing         +----------+-------+--------+--------+-----------------------+-----------------+ +----------+--------+--------+--------+-------------------+           PSV cm/sEDV cm/sDescribeArm Pressure (mmHG) +----------+--------+--------+--------+-------------------+ [id number]     63      Stenotic                    +----------+--------+--------+--------+-------------------+ +---------+--------+--+--------+--+---------+ VertebralPSV cm/s76EDV cm/s23Antegrade +---------+--------+--+--------+--+---------+   Summary: Right Carotid: Velocities in the right ICA are consistent with a 40-59%                stenosis. Left Carotid: Velocities in the left ICA are consistent with a 40-59% stenosis.               The ECA appears >50% stenosed. Vertebrals:  Bilateral vertebral arteries demonstrate antegrade flow. Subclavians: Left subclavian artery was stenotic. Normal flow hemodynamics were              seen in the right subclavian artery. *See table(s) above for measurements and observations.  Electronically signed by [name] MD on 05/06/2022 at 5:56:55 PM.    Final    MR BRAIN WO CONTRAST  Result Date: 05/06/2022 CLINICAL DATA:  Stroke follow-up EXAM: MRI HEAD WITHOUT CONTRAST TECHNIQUE: Multiplanar, multiecho pulse sequences of the brain and surrounding structures were obtained without intravenous contrast. COMPARISON:  Head CT from yesterday FINDINGS: Brain: Subcentimeter areas of restricted diffusion in the subcortical left frontal lobe and temporal occipital region. Small area of restricted diffusion in the posterior right pons. Advanced chronic small vessel ischemia with  extensive gliosis and small vessel infarcts affecting the brainstem, deep gray nuclei, and deep white matter tracks. Premature cerebral volume loss. Chronic microhemorrhages with hypertensive pattern. No  acute hemorrhage or hydrocephalus.  No masslike finding. Vascular: Major flow voids are preserved Skull and upper cervical spine: Normal marrow signal. 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 Right more than left mastoid opacification with normal nasopharynx. IMPRESSION: 1. Small acute infarcts in the right posterior pons and subcortical left cerebral hemisphere. 2. Background of severe chronic small vessel ischemia. Electronically Signed   By: Jorje Guild M.D.   On: 05/06/2022 05:29   CT HEAD WO CONTRAST (5MM)  Result Date: 05/05/2022 CLINICAL DATA:  Slurred speech EXAM: CT HEAD WITHOUT CONTRAST TECHNIQUE: Contiguous axial images were obtained from the base of the skull through the vertex without intravenous contrast. RADIATION DOSE REDUCTION: This exam was performed according to the departmental dose-optimization program which includes automated exposure control, adjustment of the mA and/or kV according to patient size and/or use of iterative reconstruction technique. COMPARISON:  01/11/2015 FINDINGS: Brain: No evidence of acute infarction, hemorrhage, hydrocephalus, extra-axial collection or mass lesion/mass effect. Subcortical white matter and periventricular small vessel ischemic changes. Chronic lacunar infarcts in the right corona radiata. Vascular: Intracranial atherosclerosis. Skull: Normal. Negative for fracture or focal lesion. Sinuses/Orbits: Postprocedural changes involving the right globe. The visualized paranasal sinuses are essentially clear. The mastoid air cells are unopacified. Other: None. IMPRESSION: No evidence of acute intracranial abnormality. Small vessel ischemic changes. Chronic lacunar infarcts in the right corona radiata. Electronically Signed   By: Julian Hy M.D.    On: 05/05/2022 21:17    Scheduled Meds:  sodium chloride   Intravenous Once   aspirin EC  81 mg Oral QPM   atorvastatin  40 mg Oral Daily   clopidogrel  75 mg Oral Daily   darbepoetin (ARANESP) injection - NON-DIALYSIS  60 mcg Subcutaneous Q Thu-1800   hydrOXYzine  10 mg Oral TID   loratadine  10 mg Oral Daily   pantoprazole (PROTONIX) IV  40 mg Intravenous Q24H   sevelamer carbonate  800 mg Oral TID WC   sodium bicarbonate  650 mg Oral BID   Continuous Infusions:     LOS: 2 days   Darliss Cheney, MD [facility]  05/07/2022, 10:13 AM   *Please note that this is a verbal dictation therefore any spelling or grammatical errors are due to the "[place] One" system interpretation.  Please page via [place] and do not message via secure chat for urgent patient care matters. Secure chat can be used for non urgent patient care matters.  How to contact the [facility] Attending or Consulting provider [place] or covering provider during after hours [place], for this patient?  Check the care team in [facility] and look for a) attending/consulting TR[facility] provider listed and b) the [facility] team listed. Page or secure chat [phone]. Log into [email or web address] and use [place]'s universal password to access. If you do not have the password, please contact the hospital operator. Locate the [facility] provider you are looking for under [facility] and page to a number that you can be directly reached. If you still have difficulty reaching the provider, please page the [facility] (Director on Call) for the Hospitalists listed on amion for assistance.

## 2022-05-07 NOTE — Consult Note (Signed)
Chief Complaint: Patient was seen in consultation today for [facility] placement Chief Complaint  Patient presents with   Chest Pain   at the request of Donato Heinz  Referring Physician(s): Donato Heinz  Supervising Physician: Juliet Rude  Patient Status: [facility] - In-pt  History of Present Illness: Cody Webster is a 56 y.o. male with PMHs of diastolic HF, HTN, DM, latent TB, CKD5, s/p L UE first stage basilic vein transposition on 03/23/2020, HTN, GERD, latent TB, hospitalized on 12/20 due to chest pain who presents for [facility] placement.   Patient was admitted under cardiology service on 12/20 due to chest pain and acute systolic heart failure, hospital course complicated by acute stroke on 12/21 and ongoing chest pain. Patient was diagnosed with unstable angina and cardiology is considering invasive angiography, but patient will need to be started on hemodialysis and cleared by neurology before cardiology can proceed, he is currently being treated by medically.    Nephrology has been following and HD was recommended to the patient due to uremic sx, he has been reluctant about long term HD but agreed to try short term HD.  HD was attempted via LUE AVF but it was unsuccessful, vascular surgery was consulted. Patient has been followed by vascular surgery as outpatient, he underwent first stage basilic vein transposition on 03/23/2020 and was set up for second stage in January 2023 but it did not occur. Patient is scheduled for the second stage basilic vein transposition next week.   IR was requested for PC placement.   Spoke with Dr. Hollie Salk from nephrology is patient needs just a temp cath as he is not convinced for long term HD yet. Dr. Hollie Salk asks to proceed with [facility] as patient will receive more benefit from [facility] than temp cath.   Patient laying in bed, not in acute distress.  Reports persistent chest pain 8/10 but patient is not in distress.  Denise headache, fever, chills, shortness  of breath, cough,  abdominal pain, nausea ,vomiting, and bleeding.    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 ([place]) 2015   EF now improved back to normal mildly reduced   Peripheral neuropathy    Pneumonia    PTSD (post-traumatic stress disorder)    Seizures ([place])    27 years ago    Past Surgical History:  Procedure Laterality Date   AV FISTULA PLACEMENT Left 11/08/2016   Procedure: ARTERIOVENOUS (AV) FISTULA CREATION;  Surgeon: Angelia Mould, MD;  Location: [place];  Service: Vascular;  Laterality: Left;   AV FISTULA PLACEMENT Left 03/23/2020   Procedure: LEFT BASILIC VEIN FISTULA CREATION;  Surgeon: Angelia Mould, MD;  Location: [facility] OR;  Service: Vascular;  Laterality: Left;   NM MYOVIEW LTD  07/2017   "Moderate sized moderate severity" (on cardiology was very small size, small intensity) partially reversible inferoapical//inferoseptal defect.  (Read as intermediate-high risk) -> over read by cardiology as [place]  09/2019   [facility] Small sized, mild severity reversible defect involving apical lateral & mid anterolateral wall thought to be artifacts - but CRO mild  ischemia.  EF 55%. => Read as LOW RISK:   right foot surgery     TOE AMPUTATION     TRANSTHORACIC ECHOCARDIOGRAM  07/2017   EF 60%.,  Moderate LVH.  GR 1 DD.  No R WMA.  Mild RV and moderate RA dilation.   TRANSTHORACIC ECHOCARDIOGRAM  10/03/2019   [facility]: EF 40%, mild MR, mild aortic calcification    Allergies: Morphine and related  Medications: Prior to Admission medications   Medication Sig Start Date End Date Taking? Authorizing Provider   acetaminophen (TYLENOL) 500 MG tablet Take 1,000 mg by mouth every 6 (six) hours as needed (for pain/headaches.).   Yes [provider]  cetirizine (ZYRTEC) 10 MG tablet TAKE 1 TABLET (10 MG TOTAL) BY MOUTH DAILY. 07/27/21  Yes Kerin Perna, NP  cloNIDine (CATAPRES) 0.1 MG tablet TAKE ONE (1) TABLET BY MOUTH THREE TIMES DAILY. Patient taking differently: Take 0.1 mg by mouth 3 (three) times daily. 04/01/22  Yes Kerin Perna, NP  fluticasone (FLONASE) 50 MCG/ACT nasal spray Place 2 sprays into both nostrils daily. Patient taking differently: Place 2 sprays into both nostrils daily as needed for allergies. 08/25/20  Yes Kerin Perna, NP  gabapentin (NEURONTIN) 300 MG capsule Take 1 capsule (300 mg total) by mouth 2 (two) times daily. Patient taking differently: Take 300 mg by mouth at bedtime. 04/21/22  Yes Newt Minion, MD  guaiFENesin (MUCINEX) 600 MG 12 hr tablet Take 1 tablet (600 mg total) by mouth 2 (two) times daily as needed for to loosen phlegm. 10/01/20  Yes Kerin Perna, NP  oxyCODONE-acetaminophen (PERCOCET) 10-325 MG tablet Take 1 tablet by mouth every 6 (six) hours as needed for pain. 03/02/22 05/31/22 Yes [provider]  traZODone (DESYREL) 100 MG tablet TAKE 1/2 TO 1 TABLET BY MOUTH AT BEDTIME AS NEEDED FOR SLEEP. Patient taking differently: Take 50-100 mg by mouth at bedtime as needed for sleep. 10/27/21  Yes Kerin Perna, NP  aspirin EC 81 MG tablet Take 81 mg by mouth every evening. Patient not taking: Reported on 03/29/2022    [provider]  diclofenac Sodium (VOLTAREN) 1 % GEL Apply 4 g topically 4 (four) times daily. Patient not taking: Reported on 03/29/2022 05/24/21   Kerin Perna, NP  hydrALAZINE (APRESOLINE) 50 MG tablet TAKE 2 TABLETS (100 MG TOTAL) BY MOUTH 3 (THREE) TIMES DAILY. Patient not taking: Reported on 03/29/2022 12/14/17   Leonie Man, MD  isoniazid (NYDRAZID) 300 MG tablet Take 1 tablet (300  mg total) by mouth daily. Patient not taking: Reported on 05/04/2022 03/29/22   Thayer Headings, MD  nitroGLYCERIN (NITROSTAT) 0.4 MG SL tablet Place 1 tablet (0.4 mg total) under the tongue every 5 (five) minutes as needed for chest pain (CP or SOB). 07/22/17   Florencia Reasons, MD  pyridOXINE (VITAMIN B6) 50 MG tablet Take 1 tablet (50 mg total) by mouth daily. Patient not taking: Reported on 05/04/2022 03/29/22   Thayer Headings, MD     Family History  Problem Relation Age of Onset   CAD Mother    Heart attack Mother    Diabetes type II Father    Stroke Father    CAD Father    Hypertension Sister    CAD Sister    Diabetes type II Brother    Colon cancer Maternal Uncle        2015   Colon polyps Neg Hx    Esophageal cancer Neg  Hx    Stomach cancer Neg Hx    Rectal cancer Neg Hx     Social History   Socioeconomic History   Marital status: Single    Spouse name: Not on file   Number of children: 1   Years of education: Not on file   Highest education level: Not on file  Occupational History    Comment: Disabled - for Back pain & foot ulcer  Tobacco Use   Smoking status: Every Day    Packs/day: 0.50    Years: 30.00    Total pack years: 15.00    Types: Cigarettes   Smokeless tobacco: Former    Types: Snuff, Chew    Quit date: 12/11/1983  Vaping Use   Vaping Use: Never used  Substance and Sexual Activity   Alcohol use: Not Currently    Comment: occasionally   Drug use: No   Sexual activity: Not Currently  Other Topics Concern   Not on file  Social History Narrative   Not on file   Social Determinants of Health   Financial Resource Strain: Not on file  Food Insecurity: No Food Insecurity (05/05/2022)   Hunger Vital Sign    Worried About Running Out of Food in the Last Year: Never true    Ran Out of Food in the Last Year: Never true  Transportation Needs: No Transportation Needs (05/05/2022)   PRAPARE - Hydrologist (Medical): No    Lack  of Transportation (Non-Medical): No  Physical Activity: Not on file  Stress: Not on file  Social Connections: Not on file     Review of Systems: A 12 point ROS discussed and pertinent positives are indicated in the HPI above.  All other systems are negative.  Vital Signs: BP (!) 171/86 (BP Location: Right Arm)   Pulse 92   Temp 98.2 F (36.8 C) (Oral)   Resp 16   Ht '6\' 1"'$  (1.854 m)   Wt 170 lb 13.7 oz (77.5 kg)   SpO2 98%   BMI 22.54 kg/m    Physical Exam Vitals reviewed.  Constitutional:      General: He is not in acute distress.    Appearance: He is not ill-appearing.  HENT:     Head: Normocephalic.  Cardiovascular:     Rate and Rhythm: Normal rate and regular rhythm.     Heart sounds: Normal heart sounds.  Pulmonary:     Effort: Pulmonary effort is normal.     Breath sounds: Normal breath sounds.  Skin:    General: Skin is warm and dry.     Coloration: Skin is not cyanotic or pale.  Neurological:     Mental Status: He is alert and oriented to person, place, and time.  Psychiatric:        Behavior: Behavior is agitated.     Comments: Frustrated that he cannot get the dialysis catheter placed.      MD Evaluation Airway: WNL Heart: WNL Abdomen: WNL Chest/ Lungs: WNL ASA  Classification: 3 Mallampati/Airway Score: Two  Imaging: VAS [place] TRANSCRANIAL DOPPLER  Result Date: 05/07/2022  Transcranial Doppler Patient Name:  QUINDON DENKER  Date of Exam:   05/06/2022 Medical Rec #: [id number]      Accession #:    [id number] Date of Birth: Oct 18, 1965      Patient Gender: M Patient Age:   21 years Exam Location:  [facility] Procedure:      VAS [place] TRANSCRANIAL DOPPLER Referring  Phys: Ra[place] --------------------------------------------------------------------------------  Indications: Stroke. History: HTN, HLD, SMK, DM, CHF, CKD, CVA. 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 Comparison Study: No previous exams Performing Technologist: Jody Hill RVT, RDMS  Examination Guidelines: A complete evaluation includes B-mode imaging, spectral Doppler, color Doppler, and power Doppler as needed of all accessible portions of each vessel. Bilateral testing is considered an integral part of a complete examination. Limited examinations for reoccurring indications may be performed as noted.  +----------+---------------+----------+-----------+-------------+ RIGHT TCD Right VM (cm/s)Depth (cm)Pulsatility   Comment    +----------+---------------+----------+-----------+-------------+ MCA                                           not insonated +----------+---------------+----------+-----------+-------------+ ACA                                           not insonated +----------+---------------+----------+-----------+-------------+ Term ICA                                      not insonated +----------+---------------+----------+-----------+-------------+ PCA P1                                        not insonated +----------+---------------+----------+-----------+-------------+ Opthalmic                                     not insonated +----------+---------------+----------+-----------+-------------+ ICA siphon                                    not insonated +----------+---------------+----------+-----------+-------------+ Vertebral       -70                   1.11                  +----------+---------------+----------+-----------+-------------+  +----------+--------------+----------+-----------+-------------+ LEFT TCD  Left VM (cm/s)Depth (cm)Pulsatility   Comment    +----------+--------------+----------+-----------+-------------+ MCA             29                   0.98                  +----------+--------------+----------+-----------+-------------+ ACA             -24                   0.88                  +----------+--------------+----------+-----------+-------------+ Term ICA                                     not insonated +----------+--------------+----------+-----------+-------------+ PCA P1  not insonated +----------+--------------+----------+-----------+-------------+ Opthalmic       14                   0.88                  +----------+--------------+----------+-----------+-------------+ ICA siphon      21                   2.00                  +----------+--------------+----------+-----------+-------------+ Vertebral      -31                   1.06                  +----------+--------------+----------+-----------+-------------+ Distal ICA                                   not insonated +----------+--------------+----------+-----------+-------------+  +------------+-------+-------+             VM cm/sComment +------------+-------+-------+ Prox Basilar  -77   1.12   +------------+-------+-------+ Dist Basilar  -75   1.15   +------------+-------+-------+ Summary:  Absent right temporal and poor biorbital and left temporalwindows limit evaluation of anterior circulation vessels.Mildly elevated mean flow velocities in bilateral vertebral and basilar arteries of unc;lear significance. *See table(s) above for TCD measurements and observations.  Diagnosing physician: Antony Contras MD Electronically signed by Antony Contras MD on 05/07/2022 at 9:14:15 AM.    Final    VAS US CAROTID  Result Date: 05/06/2022 Carotid Arterial Duplex Study Patient Name:  Cody Webster  Date of Exam:   05/06/2022 Medical Rec #: [id number]      Accession #:    [id number] Date of Birth: [birth year]      Patient Gender: M Patient Age:   35 years Exam Location:  [facility] Procedure:      VAS US CAROTID Referring Phys: Elwin Sleight DE [place] TOR[place]  --------------------------------------------------------------------------------  Indications:   CVA. Risk Factors:  Hypertension, hyperlipidemia, Diabetes, current smoker, prior                CVA. Other Factors: CHF, CKD. Limitations    Today's exam was limited due to heavy calcification and the                resulting shadowing and patient movement. Performing Technologist: Rogelia Rohrer RVT, RDMS  Examination Guidelines: A complete evaluation includes B-mode imaging, spectral Doppler, color Doppler, and power Doppler as needed of all accessible portions of each vessel. Bilateral testing is considered an integral part of a complete examination. Limited examinations for reoccurring indications may be performed as noted.  Right Carotid Findings: +----------+--------+--------+--------+------------------+---------+           PSV cm/sEDV cm/sStenosisPlaque DescriptionComments  +----------+--------+--------+--------+------------------+---------+ CCA Prox  120     18              calcific          Shadowing +----------+--------+--------+--------+------------------+---------+ CCA Distal148     25              calcific          Shadowing +----------+--------+--------+--------+------------------+---------+ ICA Prox  149     35      40-59%  calcific          Shadowing +----------+--------+--------+--------+------------------+---------+ ICA Mid   122     28  calcific          Shadowing +----------+--------+--------+--------+------------------+---------+ ICA Distal85      19                                          +----------+--------+--------+--------+------------------+---------+ ECA       171                     calcific          shadowing +----------+--------+--------+--------+------------------+---------+ +----------+--------+-------+----------------+-------------------+           PSV cm/sEDV cmsDescribe        Arm Pressure (mmHG)  +----------+--------+-------+----------------+-------------------+ [id number]            Multiphasic, WNL                    +----------+--------+-------+----------------+-------------------+ +---------+--------+--+--------+--+---------+ VertebralPSV cm/s75EDV cm/s23Antegrade +---------+--------+--+--------+--+---------+  Left Carotid Findings: +----------+-------+--------+--------+-----------------------+-----------------+           PSV    EDV cm/sStenosisPlaque Description     Comments                    cm/s                                                            +----------+-------+--------+--------+-----------------------+-----------------+ CCA Prox  106    22                                     intimal                                                                   thickening        +----------+-------+--------+--------+-----------------------+-----------------+ CCA Distal113    21              heterogenous and                                                          calcific                                 +----------+-------+--------+--------+-----------------------+-----------------+ ICA Prox  138    39      40-59%  calcific               Shadowing         +----------+-------+--------+--------+-----------------------+-----------------+ ICA Mid   100    24              calcific               Shadowing         +----------+-------+--------+--------+-----------------------+-----------------+ ICA Distal90     23                                                       +----------+-------+--------+--------+-----------------------+-----------------+  ECA       244            >50%    calcific               shadowing         +----------+-------+--------+--------+-----------------------+-----------------+ +----------+--------+--------+--------+-------------------+           PSV cm/sEDV cm/sDescribeArm Pressure (mmHG)  +----------+--------+--------+--------+-------------------+ [id number]     63      Stenotic                    +----------+--------+--------+--------+-------------------+ +---------+--------+--+--------+--+---------+ VertebralPSV cm/s76EDV cm/s23Antegrade +---------+--------+--+--------+--+---------+   Summary: Right Carotid: Velocities in the right ICA are consistent with a 40-59%                stenosis. Left Carotid: Velocities in the left ICA are consistent with a 40-59% stenosis.               The ECA appears >50% stenosed. Vertebrals:  Bilateral vertebral arteries demonstrate antegrade flow. Subclavians: Left subclavian artery was stenotic. Normal flow hemodynamics were              seen in the right subclavian artery. *See table(s) above for measurements and observations.  Electronically signed by [name] MD on 05/06/2022 at 5:56:55 PM.    Final    MR BRAIN WO CONTRAST  Result Date: 05/06/2022 CLINICAL DATA:  Stroke follow-up EXAM: MRI HEAD WITHOUT CONTRAST TECHNIQUE: Multiplanar, multiecho pulse sequences of the brain and surrounding structures were obtained without intravenous contrast. COMPARISON:  Head CT from yesterday FINDINGS: Brain: Subcentimeter areas of restricted diffusion in the subcortical left frontal lobe and temporal occipital region. Small area of restricted diffusion in the posterior right pons. Advanced chronic small vessel ischemia with extensive gliosis and small vessel infarcts affecting the brainstem, deep gray nuclei, and deep white matter tracks. Premature cerebral volume loss. Chronic microhemorrhages with hypertensive pattern. No acute hemorrhage or hydrocephalus.  No masslike finding. Vascular: Major flow voids are preserved Skull and upper cervical spine: Normal marrow signal. 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 Right more than left mastoid opacification with normal nasopharynx. IMPRESSION: 1. Small acute infarcts in the right posterior  pons and subcortical left cerebral hemisphere. 2. Background of severe chronic small vessel ischemia. Electronically Signed   By: Jorje Guild M.D.   On: 05/06/2022 05:29   CT HEAD WO CONTRAST (5MM)  Result Date: 05/05/2022 CLINICAL DATA:  Slurred speech EXAM: CT HEAD WITHOUT CONTRAST TECHNIQUE: Contiguous axial images were obtained from the base of the skull through the vertex without intravenous contrast. RADIATION DOSE REDUCTION: This exam was performed according to the departmental dose-optimization program which includes automated exposure control, adjustment of the mA and/or kV according to patient size and/or use of iterative reconstruction technique. COMPARISON:  01/11/2015 FINDINGS: Brain: No evidence of acute infarction, hemorrhage, hydrocephalus, extra-axial collection or mass lesion/mass effect. Subcortical white matter and periventricular small vessel ischemic changes. Chronic lacunar infarcts in the right corona radiata. Vascular: Intracranial atherosclerosis. Skull: Normal. Negative for fracture or focal lesion. Sinuses/Orbits: Postprocedural changes involving the right globe. The visualized paranasal sinuses are essentially clear. The mastoid air cells are unopacified. Other: None. IMPRESSION: No evidence of acute intracranial abnormality. Small vessel ischemic changes. Chronic lacunar infarcts in the right corona radiata. Electronically Signed   By: Julian Hy M.D.   On: 05/05/2022 21:17   ECHOCARDIOGRAM COMPLETE  Result Date: 05/05/2022    ECHOCARDIOGRAM REPORT   Patient Name:   Cody Webster Date  of Exam: 05/05/2022 Medical Rec #:  [id number]     Height:       73.0 in Accession #:    [id number]    Weight:       180.0 lb Date of Birth:  1965/06/16     BSA:          2.057 m Patient Age:    21 years      BP:           143/91 mmHg Patient Gender: M             HR:           74 bpm. Exam Location:  Inpatient Procedure: 2D Echo, Color Doppler and Cardiac Doppler Indications:    O67.67  Acute diastolic (congestive) heart failure  History:        Patient has prior history of Echocardiogram examinations, most                 recent 07/17/2020. Risk Factors:Hypertension, Diabetes,                 Dyslipidemia, Sleep Apnea and CKD.  Sonographer:    Raquel Sarna Senior RDCS Referring Phys: [id number] [place]  1. Left ventricular ejection fraction, by estimation, is 45 to 50%. The left ventricle has mildly decreased function. The left ventricle demonstrates regional wall motion abnormalities with basal to mid anterolateral and basal to mid inferior hypokinesis. There is mild concentric left ventricular hypertrophy. Left ventricular diastolic parameters are consistent with Grade II diastolic dysfunction (pseudonormalization).  2. Right ventricular systolic function is normal. The right ventricular size is mildly enlarged. There is moderately elevated pulmonary artery systolic pressure. The estimated right ventricular systolic pressure is 62.8 mmHg.  3. Left atrial size was severely dilated.  4. Right atrial size was mildly dilated.  5. The mitral valve is degenerative. Mild to moderate mitral valve regurgitation. No evidence of mitral stenosis. Moderate mitral annular calcification.  6. The aortic valve is tricuspid. There is mild calcification of the aortic valve. Aortic valve regurgitation is not visualized. No aortic stenosis is present.  7. The inferior vena cava is dilated in size with <50% respiratory variability, suggesting right atrial pressure of 15 mmHg. FINDINGS  Left Ventricle: Left ventricular ejection fraction, by estimation, is 45 to 50%. The left ventricle has mildly decreased function. The left ventricle demonstrates regional wall motion abnormalities. The left ventricular internal cavity size was normal in size. There is mild concentric left ventricular hypertrophy. Left ventricular diastolic parameters are consistent with Grade II diastolic dysfunction (pseudonormalization). Right  Ventricle: The right ventricular size is mildly enlarged. No increase in right ventricular wall thickness. Right ventricular systolic function is normal. There is moderately elevated pulmonary artery systolic pressure. The tricuspid regurgitant velocity is 3.01 m/s, and with an assumed right atrial pressure of 15 mmHg, the estimated right ventricular systolic pressure is 36.6 mmHg. Left Atrium: Left atrial size was severely dilated. Right Atrium: Right atrial size was mildly dilated. Pericardium: Trivial pericardial effusion is present. Mitral Valve: The mitral valve is degenerative in appearance. There is moderate calcification of the mitral valve leaflet(s). Moderate mitral annular calcification. Mild to moderate mitral valve regurgitation. No evidence of mitral valve stenosis. MV peak gradient, 6.9 mmHg. The mean mitral valve gradient is 3.0 mmHg. Tricuspid Valve: The tricuspid valve is normal in structure. Tricuspid valve regurgitation is trivial. Aortic Valve: The aortic valve is tricuspid. There is mild calcification of the aortic valve. Aortic valve regurgitation  is not visualized. No aortic stenosis is present. Aortic valve mean gradient measures 6.0 mmHg. Aortic valve peak gradient measures 13.2 mmHg. Aortic valve area, by VTI measures 2.16 cm. Pulmonic Valve: The pulmonic valve was normal in structure. Pulmonic valve regurgitation is not visualized. Aorta: The aortic root is normal in size and structure. Venous: The inferior vena cava is dilated in size with less than 50% respiratory variability, suggesting right atrial pressure of 15 mmHg. IAS/Shunts: No atrial level shunt detected by color flow Doppler.  LEFT VENTRICLE PLAX 2D LVIDd:         5.60 cm      Diastology LVIDs:         4.30 cm      LV e' medial:    4.23 cm/s LV PW:         1.50 cm      LV E/e' medial:  26.7 LV IVS:        1.10 cm      LV e' lateral:   7.14 cm/s LVOT diam:     1.80 cm      LV E/e' lateral: 15.8 LV SV:         75 LV SV Index:    37 LVOT Area:     2.54 cm  LV Volumes (MOD) LV vol d, MOD A2C: 173.0 ml LV vol d, MOD A4C: 182.0 ml LV vol s, MOD A2C: 88.1 ml LV vol s, MOD A4C: 84.7 ml LV SV MOD A2C:     84.9 ml LV SV MOD A4C:     182.0 ml LV SV MOD BP:      88.4 ml RIGHT VENTRICLE RV S prime:     12.40 cm/s TAPSE (M-mode): 2.4 cm LEFT ATRIUM              Index        RIGHT ATRIUM           Index LA diam:        5.30 cm  2.58 cm/m   RA Area:     21.80 cm LA Vol (A2C):   121.0 ml 58.81 ml/m  RA Volume:   66.30 ml  32.23 ml/m LA Vol (A4C):   126.0 ml 61.24 ml/m LA Biplane Vol: 127.0 ml 61.73 ml/m  AORTIC VALVE AV Area (Vmax):    1.85 cm AV Area (Vmean):   2.27 cm AV Area (VTI):     2.16 cm AV Vmax:           182.00 cm/s AV Vmean:          112.000 cm/s AV VTI:            0.349 m AV Peak Grad:      13.2 mmHg AV Mean Grad:      6.0 mmHg LVOT Vmax:         132.00 cm/s LVOT Vmean:        99.900 cm/s LVOT VTI:          0.296 m LVOT/AV VTI ratio: 0.85  AORTA Ao Root diam: 3.20 cm Ao Asc diam:  3.00 cm MITRAL VALVE                  TRICUSPID VALVE MV Area (PHT): 4.71 cm       TR Peak grad:   36.2 mmHg MV Area VTI:   2.22 cm       TR Vmax:        301.00 cm/s MV  Peak grad:  6.9 mmHg MV Mean grad:  3.0 mmHg       SHUNTS MV Vmax:       1.31 m/s       Systemic VTI:  0.30 m MV Vmean:      81.0 cm/s      Systemic Diam: 1.80 cm MV Decel Time: 161 msec MR Peak grad:    78.9 mmHg MR Mean grad:    43.0 mmHg MR Vmax:         444.00 cm/s MR Vmean:        320.0 cm/s MR PISA:         3.08 cm MR PISA Eff ROA: 21 mm MR PISA Radius:  0.70 cm MV E velocity: 113.00 cm/s MV A velocity: 40.50 cm/s MV E/A ratio:  2.79 Dalton McleanMD Electronically signed by Franki Monte Signature Date/Time: 05/05/2022/10:58:03 AM    Final    DG Chest 2 View  Result Date: 05/04/2022 CLINICAL DATA:  cp EXAM: CHEST - 2 VIEW COMPARISON:  03/29/2012 FINDINGS: Cardiac silhouette enlarged. No evidence of pneumothorax or pleural effusion. No evidence of pulmonary edema. Aorta is  calcified. No osseous abnormalities identified. IMPRESSION: Enlarged cardiac silhouette. Electronically Signed   By: Sammie Bench M.D.   On: 05/04/2022 13:19    Labs:  CBC: Recent Labs    05/04/22 1319 05/05/22 0025 05/06/22 0026  WBC 4.2 4.2 4.5  HGB 7.0* 7.3* 8.7*  HCT 22.8* 23.2* 27.4*  PLT 187 191 178    COAGS: No results for input(s): "INR", "APTT" in the last 8760 hours.  BMP: Recent Labs    05/04/22 2251 05/05/22 0025 05/06/22 0026 05/07/22 0029  NA 135 137 138 139  K 5.0 4.8 4.8 4.8  CL 108 109 110 111  CO2 18* 19* 18* 17*  GLUCOSE 106* 84 88 96  BUN 56* 58* 59* 57*  CALCIUM 7.9* 7.9* 8.1* 8.5*  CREATININE 9.59* 9.72* 10.19* 10.45*  GFRNONAA 6* 6* 5* 5*    LIVER FUNCTION TESTS: Recent Labs    03/29/22 0919 05/04/22 2251 05/05/22 0025 05/06/22 0026 05/07/22 0029  BILITOT 0.3  --   --   --   --   AST 7*  --   --   --   --   ALT 10  --   --   --   --   PROT 6.3  --   --   --   --   ALBUMIN  --  3.2* 3.0* 3.0* 3.4*    TUMOR MARKERS: No results for input(s): "AFPTM", "CEA", "CA199", "CHROMGRNA" in the last 8760 hours.  Assessment and Plan: 56 y.o. male with CKD stage 5 and uremic sx who is in need of HD access.   NPO since MN VSS CBC with no leukocytosis  On Plavix and ASA 81 mg no need for d/c 2 g Ancef to be given in IR   Risks and benefits discussed with the patient including, but not limited to bleeding, infection, vascular injury, pneumothorax which may require chest tube placement, air embolism or even death  All of the patient's questions were answered, patient is agreeable to proceed. Consent signed and in chart.  Informed by IR attending that this might need to be done tomorrow.  Spoke with nephrology, patient needs dialysis today.  Spoke with attending, critical care not involve and bedside temp cath placement is not performed by [facility].  Spoke with cardiology, temp cath placement not performed by cardiology.   Paged  PCCM to ask  if they can assist, waiting call back.  Patient to remain NPO for now.    Thank you for this interesting consult.  I greatly enjoyed meeting Elian Gloster and look forward to participating in their care.  A copy of this report was sent to the requesting provider on this date.  Electronically Signed: Tera Mater, PA-C 05/07/2022, 12:02 PM   I spent a total of 40 Minutes    in face to face in clinical consultation, greater than 50% of which was counseling/coordinating care for [facility] placement.   This chart was dictated using voice recognition software.  Despite best efforts to proofread,  errors can occur which can change the documentation meaning.

## 2022-05-07 NOTE — Progress Notes (Signed)
Rapid Response Note  Called for assistance for HD cath placement. Informed 2C RN Sarah patient would need to be temporarily moved to ICU or to procedural area for placement. IR unable to perform placement today.  AC notified--patient moved to 7U07 for placement by Olalere MD. Patient transferred back to 2C01 post CXR--per MD Olalere line is good to use.

## 2022-05-07 NOTE — Progress Notes (Signed)
Sent for patient to come down for their MRI scan, transport called [place] and said that the patient was refusing to come down.

## 2022-05-07 NOTE — Procedures (Signed)
Central Venous Catheter Insertion Procedure Note  Cody Webster  [id number]  02-Sep-1965  Date:05/07/22  Time:2:50 PM   Provider Performing:Ashtyn Freilich A Amberleigh Gerken   Procedure: Insertion of Non-tunneled Central Venous Catheter(36556)with US guidance (77824)    Indication(s) Hemodialysis  Consent Risks of the procedure as well as the alternatives and risks of each were explained to the patient and/or caregiver.  Consent for the procedure was obtained and is signed in the bedside chart  Anesthesia Topical only with 1% lidocaine   Timeout Verified patient identification, verified procedure, site/side was marked, verified correct patient position, special equipment/implants available, medications/allergies/relevant history reviewed, required imaging and test results available.  Sterile Technique Maximal sterile technique including full sterile barrier drape, hand hygiene, sterile gown, sterile gloves, mask, hair covering, sterile ultrasound probe cover (if used).  Procedure Description Area of catheter insertion was cleaned with chlorhexidine and draped in sterile fashion.   With real-time ultrasound guidance a HD catheter was placed into the right internal jugular vein.  Nonpulsatile blood flow and easy flushing noted in all ports.  The catheter was sutured in place and sterile dressing applied.  Complications/Tolerance None; patient tolerated the procedure well. Chest X-ray is ordered to verify placement for internal jugular or subclavian cannulation.  Chest x-ray is not ordered for femoral cannulation.  EBL Minimal  Specimen(s) None

## 2022-05-07 NOTE — Progress Notes (Addendum)
STROKE TEAM PROGRESS NOTE   INTERVAL HISTORY Patient is seen in his room with no family at the bedside.  He needs to have an MRI today but states he would like to have this scan after he has gotten his temporary dialysis catheter placed and undergone dialysis.  He also complains of indigestion.    Vitals:   05/07/22 0600 05/07/22 0700 05/07/22 0800 05/07/22 1124  BP: (!) 158/66 (!) 139/55 (!) 184/100 (!) 171/86  Pulse: 90 88 98 92  Resp: '10 11  16  '$ Temp: 98.2 F (36.8 C)   98.2 F (36.8 C)  TempSrc: Oral   Oral  SpO2: 93% 95% 95% 98%  Weight:      Height:       CBC:  Recent Labs  Lab 05/05/22 0025 05/06/22 0026  WBC 4.2 4.5  HGB 7.3* 8.7*  HCT 23.2* 27.4*  MCV 100.4* 97.2  PLT 191 211   Basic Metabolic Panel:  Recent Labs  Lab 05/06/22 0026 05/07/22 0029  NA 138 139  K 4.8 4.8  CL 110 111  CO2 18* 17*  GLUCOSE 88 96  BUN 59* 57*  CREATININE 10.19* 10.45*  CALCIUM 8.1* 8.5*  PHOS 5.7* 5.6*   Lipid Panel:  Recent Labs  Lab 05/05/22 0025  CHOL 156  TRIG 76  HDL 34*  CHOLHDL 4.6  VLDL 15  LDLCALC 107*   HgbA1c: No results for input(s): "HGBA1C" in the last 168 hours. Urine Drug Screen: No results for input(s): "LABOPIA", "COCAINSCRNUR", "LABBENZ", "AMPHETMU", "THCU", "LABBARB" in the last 168 hours.  Alcohol Level No results for input(s): "ETH" in the last 168 hours.  IMAGING past 24 hours VAS [place] TRANSCRANIAL DOPPLER  Result Date: 05/07/2022  Transcranial Doppler Patient Name:  Cody Webster  Date of Exam:   05/06/2022 Medical Rec #: [id number]      Accession #:    [id number] Date of Birth: 1966-02-13      Patient Gender: M Patient Age:   56 years Exam Location:  [facility] Procedure:      VAS [place] TRANSCRANIAL DOPPLER Referring Phys: Elwin Sleight DE LA TORRE --------------------------------------------------------------------------------  Indications: Stroke. History: HTN, HLD, SMK, DM, CHF, CKD, CVA. 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 Comparison Study: No previous exams Performing Technologist: Jody Hill RVT, RDMS  Examination Guidelines: A complete evaluation includes B-mode imaging, spectral Doppler, color Doppler, and power Doppler as needed of all accessible portions of each vessel. Bilateral testing is considered an integral part of a complete examination. Limited examinations for reoccurring indications may be performed as noted.  +----------+---------------+----------+-----------+-------------+ RIGHT TCD Right VM (cm/s)Depth (cm)Pulsatility   Comment    +----------+---------------+----------+-----------+-------------+ MCA                                           not insonated +----------+---------------+----------+-----------+-------------+ ACA                                           not insonated +----------+---------------+----------+-----------+-------------+ Term ICA  not insonated +----------+---------------+----------+-----------+-------------+ PCA P1                                        not insonated +----------+---------------+----------+-----------+-------------+ Opthalmic                                     not insonated +----------+---------------+----------+-----------+-------------+ ICA siphon                                    not insonated +----------+---------------+----------+-----------+-------------+ Vertebral       -70                   1.11                  +----------+---------------+----------+-----------+-------------+  +----------+--------------+----------+-----------+-------------+ LEFT TCD  Left VM (cm/s)Depth (cm)Pulsatility   Comment    +----------+--------------+----------+-----------+-------------+ MCA             29                   0.98                   +----------+--------------+----------+-----------+-------------+ ACA            -24                   0.88                  +----------+--------------+----------+-----------+-------------+ Term ICA                                     not insonated +----------+--------------+----------+-----------+-------------+ PCA P1                                       not insonated +----------+--------------+----------+-----------+-------------+ Opthalmic       14                   0.88                  +----------+--------------+----------+-----------+-------------+ ICA siphon      21                   2.00                  +----------+--------------+----------+-----------+-------------+ Vertebral      -31                   1.06                  +----------+--------------+----------+-----------+-------------+ Distal ICA                                   not insonated +----------+--------------+----------+-----------+-------------+  +------------+-------+-------+             VM cm/sComment +------------+-------+-------+ Prox Basilar  -77   1.12   +------------+-------+-------+ Dist Basilar  -75   1.15   +------------+-------+-------+ Summary:  Absent right temporal and poor biorbital and left temporalwindows limit evaluation of anterior circulation  vessels.Mildly elevated mean flow velocities in bilateral vertebral and basilar arteries of unc;lear significance. *See table(s) above for TCD measurements and observations.  Diagnosing physician: Antony Contras MD Electronically signed by Antony Contras MD on 05/07/2022 at 9:14:15 AM.    Final    VAS US CAROTID  Result Date: 05/06/2022 Carotid Arterial Duplex Study Patient Name:  Cody Webster  Date of Exam:   05/06/2022 Medical Rec #: [id number]      Accession #:    [id number] Date of Birth: 26-Oct-1965      Patient Gender: M Patient Age:   56 years Exam Location:  [facility] Procedure:      VAS US CAROTID Referring Phys:  Elwin Sleight DE LA TORRE --------------------------------------------------------------------------------  Indications:   CVA. Risk Factors:  Hypertension, hyperlipidemia, Diabetes, current smoker, prior                CVA. Other Factors: CHF, CKD. Limitations    Today's exam was limited due to heavy calcification and the                resulting shadowing and patient movement. Performing Technologist: Rogelia Rohrer RVT, RDMS  Examination Guidelines: A complete evaluation includes B-mode imaging, spectral Doppler, color Doppler, and power Doppler as needed of all accessible portions of each vessel. Bilateral testing is considered an integral part of a complete examination. Limited examinations for reoccurring indications may be performed as noted.  Right Carotid Findings: +----------+--------+--------+--------+------------------+---------+           PSV cm/sEDV cm/sStenosisPlaque DescriptionComments  +----------+--------+--------+--------+------------------+---------+ CCA Prox  120     18              calcific          Shadowing +----------+--------+--------+--------+------------------+---------+ CCA Distal148     25              calcific          Shadowing +----------+--------+--------+--------+------------------+---------+ ICA Prox  149     35      40-59%  calcific          Shadowing +----------+--------+--------+--------+------------------+---------+ ICA Mid   122     28              calcific          Shadowing +----------+--------+--------+--------+------------------+---------+ ICA Distal85      19                                          +----------+--------+--------+--------+------------------+---------+ ECA       171                     calcific          shadowing +----------+--------+--------+--------+------------------+---------+ +----------+--------+-------+----------------+-------------------+           PSV cm/sEDV cmsDescribe        Arm Pressure (mmHG)  +----------+--------+-------+----------------+-------------------+ [id number]            Multiphasic, WNL                    +----------+--------+-------+----------------+-------------------+ +---------+--------+--+--------+--+---------+ VertebralPSV cm/s75EDV cm/s23Antegrade +---------+--------+--+--------+--+---------+  Left Carotid Findings: +----------+-------+--------+--------+-----------------------+-----------------+           PSV    EDV cm/sStenosisPlaque Description     Comments                    cm/s                                                            +----------+-------+--------+--------+-----------------------+-----------------+  CCA Prox  106    22                                     intimal                                                                   thickening        +----------+-------+--------+--------+-----------------------+-----------------+ CCA Distal113    21              heterogenous and                                                          calcific                                 +----------+-------+--------+--------+-----------------------+-----------------+ ICA Prox  138    39      40-59%  calcific               Shadowing         +----------+-------+--------+--------+-----------------------+-----------------+ ICA Mid   100    24              calcific               Shadowing         +----------+-------+--------+--------+-----------------------+-----------------+ ICA Distal90     23                                                       +----------+-------+--------+--------+-----------------------+-----------------+ ECA       244            >50%    calcific               shadowing         +----------+-------+--------+--------+-----------------------+-----------------+ +----------+--------+--------+--------+-------------------+           PSV cm/sEDV cm/sDescribeArm Pressure (mmHG)  +----------+--------+--------+--------+-------------------+ [id number]     63      Stenotic                    +----------+--------+--------+--------+-------------------+ +---------+--------+--+--------+--+---------+ VertebralPSV cm/s76EDV cm/s23Antegrade +---------+--------+--+--------+--+---------+   Summary: Right Carotid: Velocities in the right ICA are consistent with a 40-59%                stenosis. Left Carotid: Velocities in the left ICA are consistent with a 40-59% stenosis.               The ECA appears >50% stenosed. Vertebrals:  Bilateral vertebral arteries demonstrate antegrade flow. Subclavians: Left subclavian artery was stenotic. Normal flow hemodynamics were              seen in the right subclavian artery. *See table(s) above for measurements and observations.  Electronically signed by [name] MD on 05/06/2022  at 5:56:55 PM.    Final     PHYSICAL EXAM General: Alert, chronically ill-appearing patient in no acute distress with clouding of the right eye Respiratory: Regular, unlabored respirations on room air   NEURO:  Mental Status: AA&Ox3  Speech/Language: speech is with no dysarthria or aphasia  Cranial Nerves:  II: Left pupil round and reactive, right eye clouded III, IV, VI: EOMI. on left V: Sensation is intact to light touch and symmetrical to face.  VII: Smile is symmetrical.   VIII: hearing intact to voice. IX, X: Phonation normal FV:CBSWHQPR shrug 5/5. XII: tongue is midline without fasciculations. Motor: 5/5 strength to all muscle groups tested.  Tone: is normal and bulk is normal Sensation- Intact to light touch bilaterally. Coordination: FTN intact bilaterally.No drift.  Gait- deferred    ASSESSMENT/PLAN Cody Webster is a 56 y.o. male with history of chronic kidney disease stage V, hypertension, GERD and latent TB who originally presented with chest pain but developed dysarthria and left facial droop.  Stroke Workup was  pursued, but patient did not receive TNK due to symptoms being too mild to treat.  MRI shows small acute infarcts in right posterior pons and left hemisphere.  Stroke: Acute infarct in right posterior pons and subcortical left hemisphere Etiology: Small vessel disease CT head No acute abnormality. Small vessel disease.  MRI small acute infarct in right posterior pons and subcortical left hemisphere MRA pending Carotid Doppler 40 to 59% stenosis in bilateral ICAs 2D Echo EF 45 to 50%, mild LVH, grade 2 diastolic dysfunction, severely dilated left atrium, no atrial level shunt Will consider 30-day cardiac monitor on discharge versus loop recorder given left atrial dilation LDL 107 HgbA1c 4.7 VTE prophylaxis -SCDs    Diet   Diet NPO time specified Except for: Sips with Meds   aspirin 81 mg daily prior to admission, now on aspirin 81 mg daily and clopidogrel 75 mg daily.  Discussed potential upcoming procedure with vascular surgery, and they were okay with proceeding with dual antiplatelet therapy. Therapy recommendations: Pending Disposition: Pending  Hypertension Home meds: Clonidine 0.1 mg 3 times daily, hydralazine 100 mg 3 times daily Stable Permissive hypertension (OK if < 220/120) but gradually normalize in 5-7 days Long-term BP goal normotensive  Hyperlipidemia Home meds: None LDL 107, goal < 70 Add atorvastatin 40 mg daily Continue statin at discharge  Other Stroke Risk Factors Cigarette smoker advised to stop smoking Coronary artery disease  Other Active Problems Latent TB-continue home medications End-stage renal disease-patient will have trial of dialysis today  Hospital day # [place] , MSN, AGACNP-BC [facility] See Amion for schedule and pager information 05/07/2022 11:49 AM    To contact Stroke Continuity provider, please refer to [email or web address] After hours, contact General Neurology

## 2022-05-07 NOTE — Progress Notes (Incomplete Revision)
STROKE TEAM PROGRESS NOTE   ATTENDING NOTE: I reviewed above note and agree with the assessment and plan. Pt was seen and examined.   ***  Wife at the bedside. Pt lying in bed, awake, alert, eyes open, orientated to age, place, time and people. No aphasia, fluent language, following all simple commands, moderate dysarthria. Able to name and repeat. 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 Mild left facial droop. Tongue midline. Bilateral UEs 5/5, no drift. Bilaterally LEs 5/5, no drift. Sensation symmetrical bilaterally, b/l FTN intact, gait not tested.    For detailed assessment and plan, please refer to above/below as I have made changes wherever appropriate.   Cody Hawking, MD PhD Stroke Neurology 05/07/2022 4:32 PM    INTERVAL HISTORY Patient is seen in his room with no family at the bedside.  He needs to have an MRI today but states he would like to have this scan after he has gotten his temporary dialysis catheter placed and undergone dialysis.  He also complains of indigestion.    Vitals:   05/07/22 0600 05/07/22 0700 05/07/22 0800 05/07/22 1124  BP: (!) 158/66 (!) 139/55 (!) 184/100 (!) 171/86  Pulse: 90 88 98 92  Resp: '10 11  16  '$ Temp: 98.2 F (36.8 C)   98.2 F (36.8 C)  TempSrc: Oral   Oral  SpO2: 93% 95% 95% 98%  Weight:      Height:       CBC:  Recent Labs  Lab 05/05/22 0025 05/06/22 0026  WBC 4.2 4.5  HGB 7.3* 8.7*  HCT 23.2* 27.4*  MCV 100.4* 97.2  PLT 191 259   Basic Metabolic Panel:  Recent Labs  Lab 05/06/22 0026 05/07/22 0029  NA 138 139  K 4.8 4.8  CL 110 111  CO2 18* 17*  GLUCOSE 88 96  BUN 59* 57*  CREATININE 10.19* 10.45*  CALCIUM 8.1* 8.5*  PHOS 5.7* 5.6*   Lipid Panel:  Recent Labs  Lab 05/05/22 0025  CHOL 156  TRIG 76  HDL 34*  CHOLHDL 4.6  VLDL 15  LDLCALC 107*   HgbA1c: No results for input(s): "HGBA1C" in the last 168 hours. Urine Drug Screen: No results for  input(s): "LABOPIA", "COCAINSCRNUR", "LABBENZ", "AMPHETMU", "THCU", "LABBARB" in the last 168 hours.  Alcohol Level No results for input(s): "ETH" in the last 168 hours.  IMAGING past 24 hours VAS [place] TRANSCRANIAL DOPPLER  Result Date: 05/07/2022  Transcranial Doppler Patient Name:  Cody Webster  Date of Exam:   05/06/2022 Medical Rec #: [id number]      Accession #:    [id number] Date of Birth: Dec 03, 1965      Patient Gender: M Patient Age:   56 years Exam Location:  [facility] Procedure:      VAS [place] TRANSCRANIAL DOPPLER Referring Phys: Elwin Sleight DE LA TORRE --------------------------------------------------------------------------------  Indications: Stroke. History: HTN, HLD, SMK, DM, CHF, CKD, CVA. 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 Comparison Study: No previous exams Performing Technologist: Jody Hill RVT, RDMS  Examination Guidelines: A complete evaluation includes B-mode imaging, spectral Doppler, color Doppler, and power Doppler as needed of all accessible portions of each vessel. Bilateral testing is considered an integral part of a complete examination. Limited examinations for reoccurring indications may be performed as noted.  +----------+---------------+----------+-----------+-------------+ RIGHT TCD Right VM (cm/s)Depth (cm)Pulsatility  Comment    +----------+---------------+----------+-----------+-------------+ MCA                                           not insonated +----------+---------------+----------+-----------+-------------+ ACA                                           not insonated +----------+---------------+----------+-----------+-------------+ Term ICA                                      not insonated +----------+---------------+----------+-----------+-------------+ PCA P1                                         not insonated +----------+---------------+----------+-----------+-------------+ Opthalmic                                     not insonated +----------+---------------+----------+-----------+-------------+ ICA siphon                                    not insonated +----------+---------------+----------+-----------+-------------+ Vertebral       -70                   1.11                  +----------+---------------+----------+-----------+-------------+  +----------+--------------+----------+-----------+-------------+ LEFT TCD  Left VM (cm/s)Depth (cm)Pulsatility   Comment    +----------+--------------+----------+-----------+-------------+ MCA             29                   0.98                  +----------+--------------+----------+-----------+-------------+ ACA            -24                   0.88                  +----------+--------------+----------+-----------+-------------+ Term ICA                                     not insonated +----------+--------------+----------+-----------+-------------+ PCA P1                                       not insonated +----------+--------------+----------+-----------+-------------+ Opthalmic       14                   0.88                  +----------+--------------+----------+-----------+-------------+ ICA siphon      21                   2.00                  +----------+--------------+----------+-----------+-------------+  Vertebral      -31                   1.06                  +----------+--------------+----------+-----------+-------------+ Distal ICA                                   not insonated +----------+--------------+----------+-----------+-------------+  +------------+-------+-------+             VM cm/sComment +------------+-------+-------+ Prox Basilar  -77   1.12   +------------+-------+-------+ Dist Basilar  -75   1.15    +------------+-------+-------+ Summary:  Absent right temporal and poor biorbital and left temporalwindows limit evaluation of anterior circulation vessels.Mildly elevated mean flow velocities in bilateral vertebral and basilar arteries of unc;lear significance. *See table(s) above for TCD measurements and observations.  Diagnosing physician: Antony Contras MD Electronically signed by Antony Contras MD on 05/07/2022 at 9:14:15 AM.    Final    VAS US CAROTID  Result Date: 05/06/2022 Carotid Arterial Duplex Study Patient Name:  Cody Webster  Date of Exam:   05/06/2022 Medical Rec #: [id number]      Accession #:    [id number] Date of Birth: 11-20-1965      Patient Gender: M Patient Age:   45 years Exam Location:  [facility] Procedure:      VAS US CAROTID Referring Phys: Elwin Sleight DE [place] TORRE --------------------------------------------------------------------------------  Indications:   CVA. Risk Factors:  Hypertension, hyperlipidemia, Diabetes, current smoker, prior                CVA. Other Factors: CHF, CKD. Limitations    Today's exam was limited due to heavy calcification and the                resulting shadowing and patient movement. Performing Technologist: Rogelia Rohrer RVT, RDMS  Examination Guidelines: A complete evaluation includes B-mode imaging, spectral Doppler, color Doppler, and power Doppler as needed of all accessible portions of each vessel. Bilateral testing is considered an integral part of a complete examination. Limited examinations for reoccurring indications may be performed as noted.  Right Carotid Findings: +----------+--------+--------+--------+------------------+---------+           PSV cm/sEDV cm/sStenosisPlaque DescriptionComments  +----------+--------+--------+--------+------------------+---------+ CCA Prox  120     18              calcific          Shadowing +----------+--------+--------+--------+------------------+---------+ CCA Distal148     25               calcific          Shadowing +----------+--------+--------+--------+------------------+---------+ ICA Prox  149     35      40-59%  calcific          Shadowing +----------+--------+--------+--------+------------------+---------+ ICA Mid   122     28              calcific          Shadowing +----------+--------+--------+--------+------------------+---------+ ICA Distal85      19                                          +----------+--------+--------+--------+------------------+---------+ ECA       171  calcific          shadowing +----------+--------+--------+--------+------------------+---------+ +----------+--------+-------+----------------+-------------------+           PSV cm/sEDV cmsDescribe        Arm Pressure (mmHG) +----------+--------+-------+----------------+-------------------+ [id number]            Multiphasic, WNL                    +----------+--------+-------+----------------+-------------------+ +---------+--------+--+--------+--+---------+ VertebralPSV cm/s75EDV cm/s23Antegrade +---------+--------+--+--------+--+---------+  Left Carotid Findings: +----------+-------+--------+--------+-----------------------+-----------------+           PSV    EDV cm/sStenosisPlaque Description     Comments                    cm/s                                                            +----------+-------+--------+--------+-----------------------+-----------------+ CCA Prox  106    22                                     intimal                                                                   thickening        +----------+-------+--------+--------+-----------------------+-----------------+ CCA Distal113    21              heterogenous and                                                          calcific                                  +----------+-------+--------+--------+-----------------------+-----------------+ ICA Prox  138    39      40-59%  calcific               Shadowing         +----------+-------+--------+--------+-----------------------+-----------------+ ICA Mid   100    24              calcific               Shadowing         +----------+-------+--------+--------+-----------------------+-----------------+ ICA Distal90     23                                                       +----------+-------+--------+--------+-----------------------+-----------------+ ECA       244            >50%    calcific  shadowing         +----------+-------+--------+--------+-----------------------+-----------------+ +----------+--------+--------+--------+-------------------+           PSV cm/sEDV cm/sDescribeArm Pressure (mmHG) +----------+--------+--------+--------+-------------------+ [id number]     63      Stenotic                    +----------+--------+--------+--------+-------------------+ +---------+--------+--+--------+--+---------+ VertebralPSV cm/s76EDV cm/s23Antegrade +---------+--------+--+--------+--+---------+   Summary: Right Carotid: Velocities in the right ICA are consistent with a 40-59%                stenosis. Left Carotid: Velocities in the left ICA are consistent with a 40-59% stenosis.               The ECA appears >50% stenosed. Vertebrals:  Bilateral vertebral arteries demonstrate antegrade flow. Subclavians: Left subclavian artery was stenotic. Normal flow hemodynamics were              seen in the right subclavian artery. *See table(s) above for measurements and observations.  Electronically signed by [name] MD on 05/06/2022 at 5:56:55 PM.    Final     PHYSICAL EXAM General: Alert, chronically ill-appearing patient in no acute distress with clouding of the right eye Respiratory: Regular, unlabored respirations on room air   NEURO:  Mental  Status: AA&Ox3  Speech/Language: speech is with no dysarthria or aphasia  Cranial Nerves:  II: Left pupil round and reactive, right eye clouded III, IV, VI: EOMI. on left V: Sensation is intact to light touch and symmetrical to face.  VII: Smile is symmetrical.   VIII: hearing intact to voice. IX, X: Phonation normal PJ:[id number] shrug 5/5. XII: tongue is midline without fasciculations. Motor: 5/5 strength to all muscle groups tested.  Tone: is normal and bulk is normal Sensation- Intact to light touch bilaterally. Coordination: FTN intact bilaterally.No drift.  Gait- deferred    ASSESSMENT/PLAN [name] is a 56 y.o. male with history of chronic kidney disease stage V, hypertension, GERD and latent TB who originally presented with chest pain but developed dysarthria and left facial droop.  Stroke Workup was pursued, but patient did not receive TNK due to symptoms being too mild to treat.  MRI shows small acute infarcts in right posterior pons and left hemisphere.  Stroke: Acute infarct in right posterior pons and subcortical left hemisphere Etiology: Small vessel disease CT head No acute abnormality. Small vessel disease.  MRI small acute infarct in right posterior pons and subcortical left hemisphere MRA pending Carotid Doppler 40 to 59% stenosis in bilateral ICAs 2D Echo EF 45 to 50%, mild LVH, grade 2 diastolic dysfunction, severely dilated left atrium, no atrial level shunt Will consider 30-day cardiac monitor on discharge versus loop recorder given left atrial dilation LDL 107 HgbA1c 4.7 VTE prophylaxis -SCDs    Diet   Diet NPO time specified Except for: Sips with Meds   aspirin 81 mg daily prior to admission, now on aspirin 81 mg daily and clopidogrel 75 mg daily.  Discussed potential upcoming procedure with vascular surgery, and they were okay with proceeding with dual antiplatelet therapy. Therapy recommendations: Pending Disposition:  Pending  Hypertension Home meds: Clonidine 0.1 mg 3 times daily, hydralazine 100 mg 3 times daily Stable Permissive hypertension (OK if < 220/120) but gradually normalize in 5-7 days Long-term BP goal normotensive  Hyperlipidemia Home meds: None LDL 107, goal < 70 Add atorvastatin 40 mg daily Continue statin at discharge  Other Stroke Risk Factors Cigarette smoker advised to stop smoking Coronary artery  disease  Other Active Problems Latent TB-continue home medications End-stage renal disease-patient will have trial of dialysis today  Hospital day # [place] , MSN, AGACNP-BC [facility] See Amion for schedule and pager information 05/07/2022 11:49 AM    To contact Stroke Continuity provider, please refer to [email or web address] After hours, contact General Neurology

## 2022-05-07 NOTE — Progress Notes (Signed)
PCCM was able to assist with temp cath placement, appreciate their assistant.  NPO order discontinued, renal diet order placed.  IR will plan to place tunneled dialysis catheter placement next week.   Please call IR for questions and concerns.   Armando Gang Merly Hinkson PA-C 05/07/2022 2:37 PM

## 2022-05-07 NOTE — Progress Notes (Signed)
Cardiology Progress Note  Patient ID: Bhavya Grand MRN: [id number] DOB: 1965-07-08 Date of Encounter: 05/07/2022  Primary Cardiologist: Glenetta Hew, MD  Subjective   Chief Complaint: Chest pain  HPI: Chest pain overnight.  Relieved by nitroglycerin.  With permissive hypertension in the setting of acute stroke.  Needs dialysis catheter placed today as well.  ROS:  All other ROS reviewed and negative. Pertinent positives noted in the HPI.     Inpatient Medications  Scheduled Meds:  sodium chloride   Intravenous Once   aspirin EC  81 mg Oral QPM   atorvastatin  40 mg Oral Daily   darbepoetin (ARANESP) injection - NON-DIALYSIS  60 mcg Subcutaneous Q Thu-1800   loratadine  10 mg Oral Daily   pantoprazole (PROTONIX) IV  40 mg Intravenous Q24H   sevelamer carbonate  800 mg Oral TID WC   sodium bicarbonate  650 mg Oral BID   Continuous Infusions:  PRN Meds: acetaminophen, diphenhydrAMINE, fluticasone, HYDROmorphone (DILAUDID) injection, hydrOXYzine, nitroGLYCERIN, oxyCODONE-acetaminophen **AND** oxyCODONE, prochlorperazine, traZODone   Vital Signs   Vitals:   05/07/22 0000 05/07/22 0600 05/07/22 0700 05/07/22 0800  BP: (!) 175/76 (!) 158/66 (!) 139/55 (!) 184/100  Pulse: 100 90 88 98  Resp: '20 10 11   '$ Temp: 98.5 F (36.9 C) 98.2 F (36.8 C)    TempSrc: Oral Oral    SpO2: 94% 93% 95% 95%  Weight:      Height:        Intake/Output Summary (Last 24 hours) at 05/07/2022 0837 Last data filed at 05/06/2022 2201 Gross per 24 hour  Intake 240 ml  Output 200 ml  Net 40 ml      05/05/2022   11:00 PM 05/04/2022   12:35 PM 03/29/2022    8:47 AM  Last 3 Weights  Weight (lbs) 170 lb 13.7 oz 180 lb 177 lb  Weight (kg) 77.5 kg 81.647 kg 80.287 kg      Telemetry  Overnight telemetry shows sinus tachycardia heart rate in the low 100s, which I personally reviewed.   ECG  The most recent ECG shows sinus rhythm heart rate 64, no acute ischemic changes or evidence of  infarction, which I personally reviewed.   Physical Exam   Vitals:   05/07/22 0000 05/07/22 0600 05/07/22 0700 05/07/22 0800  BP: (!) 175/76 (!) 158/66 (!) 139/55 (!) 184/100  Pulse: 100 90 88 98  Resp: '20 10 11   '$ Temp: 98.5 F (36.9 C) 98.2 F (36.8 C)    TempSrc: Oral Oral    SpO2: 94% 93% 95% 95%  Weight:      Height:        Intake/Output Summary (Last 24 hours) at 05/07/2022 0837 Last data filed at 05/06/2022 2201 Gross per 24 hour  Intake 240 ml  Output 200 ml  Net 40 ml       05/05/2022   11:00 PM 05/04/2022   12:35 PM 03/29/2022    8:47 AM  Last 3 Weights  Weight (lbs) 170 lb 13.7 oz 180 lb 177 lb  Weight (kg) 77.5 kg 81.647 kg 80.287 kg    Body mass index is 22.54 kg/m.  General: Well nourished, well developed, in no acute distress Head: Atraumatic, normal size  Eyes: PEERLA, EOMI  Neck: Supple, no JVD Endocrine: No thryomegaly Cardiac: Normal S1, S2; RRR; no murmurs, rubs, or gallops Lungs: Clear to auscultation bilaterally, no wheezing, rhonchi or rales  Abd: Soft, nontender, no hepatomegaly  Ext: No edema, pulses 2+ Musculoskeletal: No  deformities, BUE and BLE strength normal and equal Skin: Warm and dry, no rashes   Neuro: Alert and oriented to person, place, time, and situation, CNII-XII grossly intact, no focal deficits  Psych: Normal mood and affect   Labs  High Sensitivity Troponin:   Recent Labs  Lab 05/04/22 1319 05/04/22 1511 05/04/22 2251 05/05/22 0025  TROPONINIHS 221* 220* 190* 246*     Cardiac EnzymesNo results for input(s): "TROPONINI" in the last 168 hours. No results for input(s): "TROPIPOC" in the last 168 hours.  Chemistry Recent Labs  Lab 05/05/22 0025 05/06/22 0026 05/07/22 0029  NA 137 138 139  K 4.8 4.8 4.8  CL 109 110 111  CO2 19* 18* 17*  GLUCOSE 84 88 96  BUN 58* 59* 57*  CREATININE 9.72* 10.19* 10.45*  CALCIUM 7.9* 8.1* 8.5*  ALBUMIN 3.0* 3.0* 3.4*  GFRNONAA 6* 5* 5*  ANIONGAP '9 10 11     '$ Hematology Recent Labs  Lab 05/04/22 1319 05/05/22 0025 05/06/22 0026  WBC 4.2 4.2 4.5  RBC 2.24* 2.31* 2.82*  HGB 7.0* 7.3* 8.7*  HCT 22.8* 23.2* 27.4*  MCV 101.8* 100.4* 97.2  MCH 31.3 31.6 30.9  MCHC 30.7 31.5 31.8  RDW 15.3 15.0 16.1*  PLT 187 191 178   BNP Recent Labs  Lab 05/04/22 1308  BNP 2,226.4*    DDimer No results for input(s): "DDIMER" in the last 168 hours.   Radiology  VAS US CAROTID  Result Date: 05/06/2022 Carotid Arterial Duplex Study Patient Name:  BROOKS KINNAN  Date of Exam:   05/06/2022 Medical Rec #: [id number]      Accession #:    [id number] Date of Birth: 04/26/1966      Patient Gender: M Patient Age:   56 years Exam Location:  [facility] Procedure:      VAS US CAROTID Referring Phys: Elwin Sleight DE LA TORRE --------------------------------------------------------------------------------  Indications:   CVA. Risk Factors:  Hypertension, hyperlipidemia, Diabetes, current smoker, prior                CVA. Other Factors: CHF, CKD. Limitations    Today's exam was limited due to heavy calcification and the                resulting shadowing and patient movement. Performing Technologist: Rogelia Rohrer RVT, RDMS  Examination Guidelines: A complete evaluation includes B-mode imaging, spectral Doppler, color Doppler, and power Doppler as needed of all accessible portions of each vessel. Bilateral testing is considered an integral part of a complete examination. Limited examinations for reoccurring indications may be performed as noted.  Right Carotid Findings: +----------+--------+--------+--------+------------------+---------+           PSV cm/sEDV cm/sStenosisPlaque DescriptionComments  +----------+--------+--------+--------+------------------+---------+ CCA Prox  120     18              calcific          Shadowing +----------+--------+--------+--------+------------------+---------+ CCA Distal148     25              calcific          Shadowing  +----------+--------+--------+--------+------------------+---------+ ICA Prox  149     35      40-59%  calcific          Shadowing +----------+--------+--------+--------+------------------+---------+ ICA Mid   122     28              calcific          Shadowing +----------+--------+--------+--------+------------------+---------+ ICA Distal85  19                                          +----------+--------+--------+--------+------------------+---------+ ECA       171                     calcific          shadowing +----------+--------+--------+--------+------------------+---------+ +----------+--------+-------+----------------+-------------------+           PSV cm/sEDV cmsDescribe        Arm Pressure (mmHG) +----------+--------+-------+----------------+-------------------+ [id number]            Multiphasic, WNL                    +----------+--------+-------+----------------+-------------------+ +---------+--------+--+--------+--+---------+ VertebralPSV cm/s75EDV cm/s23Antegrade +---------+--------+--+--------+--+---------+  Left Carotid Findings: +----------+-------+--------+--------+-----------------------+-----------------+           PSV    EDV cm/sStenosisPlaque Description     Comments                    cm/s                                                            +----------+-------+--------+--------+-----------------------+-----------------+ CCA Prox  106    22                                     intimal                                                                   thickening        +----------+-------+--------+--------+-----------------------+-----------------+ CCA Distal113    21              heterogenous and                                                          calcific                                 +----------+-------+--------+--------+-----------------------+-----------------+ ICA Prox  138     39      40-59%  calcific               Shadowing         +----------+-------+--------+--------+-----------------------+-----------------+ ICA Mid   100    24              calcific               Shadowing         +----------+-------+--------+--------+-----------------------+-----------------+ ICA Distal90     23                                                       +----------+-------+--------+--------+-----------------------+-----------------+  ECA       244            >50%    calcific               shadowing         +----------+-------+--------+--------+-----------------------+-----------------+ +----------+--------+--------+--------+-------------------+           PSV cm/sEDV cm/sDescribeArm Pressure (mmHG) +----------+--------+--------+--------+-------------------+ [id number]     63      Stenotic                    +----------+--------+--------+--------+-------------------+ +---------+--------+--+--------+--+---------+ VertebralPSV cm/s76EDV cm/s23Antegrade +---------+--------+--+--------+--+---------+   Summary: Right Carotid: Velocities in the right ICA are consistent with a 40-59%                stenosis. Left Carotid: Velocities in the left ICA are consistent with a 40-59% stenosis.               The ECA appears >50% stenosed. Vertebrals:  Bilateral vertebral arteries demonstrate antegrade flow. Subclavians: Left subclavian artery was stenotic. Normal flow hemodynamics were              seen in the right subclavian artery. *See table(s) above for measurements and observations.  Electronically signed by [name] MD on 05/06/2022 at 5:56:55 PM.    Final    VAS [place] TRANSCRANIAL DOPPLER  Result Date: 05/06/2022  Transcranial Doppler Patient Name:  TYKEE HEIDEMAN  Date of Exam:   05/06/2022 Medical Rec #: [id number]      Accession #:    [id number] Date of Birth: 04/15/1966      Patient Gender: M Patient Age:   85 years Exam Location:  [facility]  Procedure:      VAS [place] TRANSCRANIAL DOPPLER Referring Phys: Elwin Sleight DE LA TORRE --------------------------------------------------------------------------------  Indications: Stroke. History: HTN, HLD, SMK, DM, CHF, CKD, CVA. 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 Comparison Study: No previous exams Performing Technologist: Jody Hill RVT, RDMS  Examination Guidelines: A complete evaluation includes B-mode imaging, spectral Doppler, color Doppler, and power Doppler as needed of all accessible portions of each vessel. Bilateral testing is considered an integral part of a complete examination. Limited examinations for reoccurring indications may be performed as noted.  +----------+---------------+----------+-----------+-------------+ RIGHT TCD Right VM (cm/s)Depth (cm)Pulsatility   Comment    +----------+---------------+----------+-----------+-------------+ MCA                                           not insonated +----------+---------------+----------+-----------+-------------+ ACA                                           not insonated +----------+---------------+----------+-----------+-------------+ Term ICA                                      not insonated +----------+---------------+----------+-----------+-------------+ PCA P1                                        not insonated +----------+---------------+----------+-----------+-------------+ Opthalmic  not insonated +----------+---------------+----------+-----------+-------------+ ICA siphon                                    not insonated +----------+---------------+----------+-----------+-------------+ Vertebral       -70                   1.11                   +----------+---------------+----------+-----------+-------------+  +----------+--------------+----------+-----------+-------------+ LEFT TCD  Left VM (cm/s)Depth (cm)Pulsatility   Comment    +----------+--------------+----------+-----------+-------------+ MCA             29                   0.98                  +----------+--------------+----------+-----------+-------------+ ACA            -24                   0.88                  +----------+--------------+----------+-----------+-------------+ Term ICA                                     not insonated +----------+--------------+----------+-----------+-------------+ PCA P1                                       not insonated +----------+--------------+----------+-----------+-------------+ Opthalmic       14                   0.88                  +----------+--------------+----------+-----------+-------------+ ICA siphon      21                   2.00                  +----------+--------------+----------+-----------+-------------+ Vertebral      -31                   1.06                  +----------+--------------+----------+-----------+-------------+ Distal ICA                                   not insonated +----------+--------------+----------+-----------+-------------+  +------------+-------+-------+             VM cm/sComment +------------+-------+-------+ Prox Basilar  -77   1.12   +------------+-------+-------+ Dist Basilar  -75   1.15   +------------+-------+-------+    Preliminary    MR BRAIN WO CONTRAST  Result Date: 05/06/2022 CLINICAL DATA:  Stroke follow-up EXAM: MRI HEAD WITHOUT CONTRAST TECHNIQUE: Multiplanar, multiecho pulse sequences of the brain and surrounding structures were obtained without intravenous contrast. COMPARISON:  Head CT from yesterday FINDINGS: Brain: Subcentimeter areas of restricted diffusion in the subcortical left frontal lobe and temporal occipital  region. Small area of restricted diffusion in the posterior right pons. Advanced chronic small vessel ischemia with extensive gliosis and small vessel infarcts affecting the brainstem, deep gray nuclei, and deep white matter tracks. Premature cerebral  volume loss. Chronic microhemorrhages with hypertensive pattern. No acute hemorrhage or hydrocephalus.  No masslike finding. Vascular: Major flow voids are preserved Skull and upper cervical spine: Normal marrow signal. 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 Right more than left mastoid opacification with normal nasopharynx. IMPRESSION: 1. Small acute infarcts in the right posterior pons and subcortical left cerebral hemisphere. 2. Background of severe chronic small vessel ischemia. Electronically Signed   By: Jorje Guild M.D.   On: 05/06/2022 05:29   CT HEAD WO CONTRAST (5MM)  Result Date: 05/05/2022 CLINICAL DATA:  Slurred speech EXAM: CT HEAD WITHOUT CONTRAST TECHNIQUE: Contiguous axial images were obtained from the base of the skull through the vertex without intravenous contrast. RADIATION DOSE REDUCTION: This exam was performed according to the departmental dose-optimization program which includes automated exposure control, adjustment of the mA and/or kV according to patient size and/or use of iterative reconstruction technique. COMPARISON:  01/11/2015 FINDINGS: Brain: No evidence of acute infarction, hemorrhage, hydrocephalus, extra-axial collection or mass lesion/mass effect. Subcortical white matter and periventricular small vessel ischemic changes. Chronic lacunar infarcts in the right corona radiata. Vascular: Intracranial atherosclerosis. Skull: Normal. Negative for fracture or focal lesion. Sinuses/Orbits: Postprocedural changes involving the right globe. The visualized paranasal sinuses are essentially clear. The mastoid air cells are unopacified. Other: None. IMPRESSION: No evidence of acute intracranial abnormality. Small vessel ischemic  changes. Chronic lacunar infarcts in the right corona radiata. Electronically Signed   By: Julian Hy M.D.   On: 05/05/2022 21:17   ECHOCARDIOGRAM COMPLETE  Result Date: 05/05/2022    ECHOCARDIOGRAM REPORT   Patient Name:   DOUGLES KIMMEY Date of Exam: 05/05/2022 Medical Rec #:  [id number]     Height:       73.0 in Accession #:    [id number]    Weight:       180.0 lb Date of Birth:  Jul 12, 1965     BSA:          2.057 m Patient Age:    74 years      BP:           143/91 mmHg Patient Gender: M             HR:           74 bpm. Exam Location:  Inpatient Procedure: 2D Echo, Color Doppler and Cardiac Doppler Indications:    P37.90 Acute diastolic (congestive) heart failure  History:        Patient has prior history of Echocardiogram examinations, most                 recent 07/17/2020. Risk Factors:Hypertension, Diabetes,                 Dyslipidemia, Sleep Apnea and CKD.  Sonographer:    Raquel Sarna Senior RDCS Referring Phys: [id number] [place]  1. Left ventricular ejection fraction, by estimation, is 45 to 50%. The left ventricle has mildly decreased function. The left ventricle demonstrates regional wall motion abnormalities with basal to mid anterolateral and basal to mid inferior hypokinesis. There is mild concentric left ventricular hypertrophy. Left ventricular diastolic parameters are consistent with Grade II diastolic dysfunction (pseudonormalization).  2. Right ventricular systolic function is normal. The right ventricular size is mildly enlarged. There is moderately elevated pulmonary artery systolic pressure. The estimated right ventricular systolic pressure is 32.9 mmHg.  3. Left atrial size was severely dilated.  4. Right atrial size was mildly dilated.  5. The mitral valve is degenerative.  Mild to moderate mitral valve regurgitation. No evidence of mitral stenosis. Moderate mitral annular calcification.  6. The aortic valve is tricuspid. There is mild calcification of the aortic valve.  Aortic valve regurgitation is not visualized. No aortic stenosis is present.  7. The inferior vena cava is dilated in size with <50% respiratory variability, suggesting right atrial pressure of 15 mmHg. FINDINGS  Left Ventricle: Left ventricular ejection fraction, by estimation, is 45 to 50%. The left ventricle has mildly decreased function. The left ventricle demonstrates regional wall motion abnormalities. The left ventricular internal cavity size was normal in size. There is mild concentric left ventricular hypertrophy. Left ventricular diastolic parameters are consistent with Grade II diastolic dysfunction (pseudonormalization). Right Ventricle: The right ventricular size is mildly enlarged. No increase in right ventricular wall thickness. Right ventricular systolic function is normal. There is moderately elevated pulmonary artery systolic pressure. The tricuspid regurgitant velocity is 3.01 m/s, and with an assumed right atrial pressure of 15 mmHg, the estimated right ventricular systolic pressure is 35.3 mmHg. Left Atrium: Left atrial size was severely dilated. Right Atrium: Right atrial size was mildly dilated. Pericardium: Trivial pericardial effusion is present. Mitral Valve: The mitral valve is degenerative in appearance. There is moderate calcification of the mitral valve leaflet(s). Moderate mitral annular calcification. Mild to moderate mitral valve regurgitation. No evidence of mitral valve stenosis. MV peak gradient, 6.9 mmHg. The mean mitral valve gradient is 3.0 mmHg. Tricuspid Valve: The tricuspid valve is normal in structure. Tricuspid valve regurgitation is trivial. Aortic Valve: The aortic valve is tricuspid. There is mild calcification of the aortic valve. Aortic valve regurgitation is not visualized. No aortic stenosis is present. Aortic valve mean gradient measures 6.0 mmHg. Aortic valve peak gradient measures 13.2 mmHg. Aortic valve area, by VTI measures 2.16 cm. Pulmonic Valve: The  pulmonic valve was normal in structure. Pulmonic valve regurgitation is not visualized. Aorta: The aortic root is normal in size and structure. Venous: The inferior vena cava is dilated in size with less than 50% respiratory variability, suggesting right atrial pressure of 15 mmHg. IAS/Shunts: No atrial level shunt detected by color flow Doppler.  LEFT VENTRICLE PLAX 2D LVIDd:         5.60 cm      Diastology LVIDs:         4.30 cm      LV e' medial:    4.23 cm/s LV PW:         1.50 cm      LV E/e' medial:  26.7 LV IVS:        1.10 cm      LV e' lateral:   7.14 cm/s LVOT diam:     1.80 cm      LV E/e' lateral: 15.8 LV SV:         75 LV SV Index:   37 LVOT Area:     2.54 cm  LV Volumes (MOD) LV vol d, MOD A2C: 173.0 ml LV vol d, MOD A4C: 182.0 ml LV vol s, MOD A2C: 88.1 ml LV vol s, MOD A4C: 84.7 ml LV SV MOD A2C:     84.9 ml LV SV MOD A4C:     182.0 ml LV SV MOD BP:      88.4 ml RIGHT VENTRICLE RV S prime:     12.40 cm/s TAPSE (M-mode): 2.4 cm LEFT ATRIUM              Index  RIGHT ATRIUM           Index LA diam:        5.30 cm  2.58 cm/m   RA Area:     21.80 cm LA Vol (A2C):   121.0 ml 58.81 ml/m  RA Volume:   66.30 ml  32.23 ml/m LA Vol (A4C):   126.0 ml 61.24 ml/m LA Biplane Vol: 127.0 ml 61.73 ml/m  AORTIC VALVE AV Area (Vmax):    1.85 cm AV Area (Vmean):   2.27 cm AV Area (VTI):     2.16 cm AV Vmax:           182.00 cm/s AV Vmean:          112.000 cm/s AV VTI:            0.349 m AV Peak Grad:      13.2 mmHg AV Mean Grad:      6.0 mmHg LVOT Vmax:         132.00 cm/s LVOT Vmean:        99.900 cm/s LVOT VTI:          0.296 m LVOT/AV VTI ratio: 0.85  AORTA Ao Root diam: 3.20 cm Ao Asc diam:  3.00 cm MITRAL VALVE                  TRICUSPID VALVE MV Area (PHT): 4.71 cm       TR Peak grad:   36.2 mmHg MV Area VTI:   2.22 cm       TR Vmax:        301.00 cm/s MV Peak grad:  6.9 mmHg MV Mean grad:  3.0 mmHg       SHUNTS MV Vmax:       1.31 m/s       Systemic VTI:  0.30 m MV Vmean:      81.0 cm/s       Systemic Diam: 1.80 cm MV Decel Time: 161 msec MR Peak grad:    78.9 mmHg MR Mean grad:    43.0 mmHg MR Vmax:         444.00 cm/s MR Vmean:        320.0 cm/s MR PISA:         3.08 cm MR PISA Eff ROA: 21 mm MR PISA Radius:  0.70 cm MV E velocity: 113.00 cm/s MV A velocity: 40.50 cm/s MV E/A ratio:  2.79 Dalton McleanMD Electronically signed by Franki Monte Signature Date/Time: 05/05/2022/10:58:03 AM    Final     Cardiac Studies  TTE 05/05/2022  1. Left ventricular ejection fraction, by estimation, is 45 to 50%. The  left ventricle has mildly decreased function. The left ventricle  demonstrates regional wall motion abnormalities with basal to mid  anterolateral and basal to mid inferior  hypokinesis. There is mild concentric left ventricular hypertrophy. Left  ventricular diastolic parameters are consistent with Grade II diastolic  dysfunction (pseudonormalization).   2. Right ventricular systolic function is normal. The right ventricular  size is mildly enlarged. There is moderately elevated pulmonary artery  systolic pressure. The estimated right ventricular systolic pressure is  84.6 mmHg.   3. Left atrial size was severely dilated.   4. Right atrial size was mildly dilated.   5. The mitral valve is degenerative. Mild to moderate mitral valve  regurgitation. No evidence of mitral stenosis. Moderate mitral annular  calcification.   6. The aortic valve is tricuspid. There is mild calcification of the  aortic valve. Aortic valve regurgitation is not visualized. No aortic  stenosis is present.   7. The inferior vena cava is dilated in size with <50% respiratory  variability, suggesting right atrial pressure of 15 mmHg.   Carotid US 05/06/2022 Summary:  Right Carotid: Velocities in the right ICA are consistent with a 40-59%                 stenosis.   Left Carotid: Velocities in the left ICA are consistent with a 40-59%  stenosis.               The ECA appears >50% stenosed.    Vertebrals:  Bilateral vertebral arteries demonstrate antegrade flow.  Subclavians: Left subclavian artery was stenotic. Normal flow hemodynamics  were              seen in the right subclavian artery.    NM Stress 07/21/2017 1. Moderate size region of moderate ischemia in the inferior basilar segment of the inferior wall.   2. Mild LEFT ventricular dilatation.   3. Left ventricular ejection fraction 47%  Patient Profile  Jatavius Ellenwood is a 56 y.o. male with diastolic heart failure, CKD stage V, hypertension, diabetes, latent tuberculosis who was admitted on 05/05/2022 with chest pain and acute systolic heart failure.  Course has been complicated by acute stroke and ongoing chest pain.  Assessment & Plan   # Chest pain, concerning for unstable angina # Elevated troponin # CAD -Known history of ischemia seen on stress test in 2019.  Has had no chest pain symptoms prior to admission.  This has been managed medically. -Now currently in the hospital with recurrent chest pain with minimally elevated troponins in setting of uncontrolled hypertension and volume overload.  He has CKD stage V and needs to start dialysis.  He also has had an acute stroke. -He undoubtedly has CAD.  He will need invasive angiography however he will need to be started on hemodialysis and cleared by neurology before we can do this.  We may just have to treat him medically.  Again in the setting of permissive hypertension in the setting of stroke it is hard to do this. -For now we will allow for lenient blood pressure control.  Continue aspirin and statin therapy.  No heparin due to recent stroke. -Add Plavix per neurology. -Holding beta-blocker and other antihypertensives in the setting of permissive hypertension. -Nitro as needed for chest pain.  Could consider Imdur but needs permissive hypertension in setting of recent stroke.  # Acute systolic heart failure, EF 45 to 50% -He will need to be started on  hemodialysis.  We are awaiting this.  Not on GDMT due to permissive hypertension in the setting of acute lacunar infarct.  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  Aspirin and Plavix per neurology.  Continue statin.  # CKD stage V -He will need to be started on dialysis.  Plans for tunneled dialysis catheter today.    For questions or updates, please contact [facility] Please consult [email or web address] for contact info under   Signed, [place] Bells T. Audie Box, MD, [place]  05/07/2022 8:37 AM

## 2022-05-07 NOTE — Progress Notes (Signed)
Patient ID: Gleb Mcguire, male   DOB: [birth year], 56 y.o.   MRN: [id number] S: Frustrated about waiting for HD catheter placement and refused to go to MRI because he wants the catheter first. O:BP (!) 184/100   Pulse 98   Temp 98.2 F (36.8 C) (Oral)   Resp 11   Ht '6\' 1"'$  (1.854 m)   Wt 77.5 kg   SpO2 95%   BMI 22.54 kg/m   Intake/Output Summary (Last 24 hours) at 05/07/2022 0941 Last data filed at 05/06/2022 2201 Gross per 24 hour  Intake --  Output 200 ml  Net -200 ml   Intake/Output: I/O last 3 completed shifts: In: 240 [P.O.:240] Out: 550 [Urine:550]  Intake/Output this shift:  No intake/output data recorded. Weight change:  Gen: NAD CVS: RRR Resp:CTA Abd: +BS, soft, TN/ND Ext: no edema, LUE AVF +T/B  Recent Labs  Lab 05/04/22 1319 05/04/22 2251 05/05/22 0025 05/06/22 0026 05/07/22 0029  NA 137 135 137 138 139  K 5.5* 5.0 4.8 4.8 4.8  CL 109 108 109 110 111  CO2 20* 18* 19* 18* 17*  GLUCOSE 83 106* 84 88 96  BUN 54* 56* 58* 59* 57*  CREATININE 9.84* 9.59* 9.72* 10.19* 10.45*  ALBUMIN  --  3.2* 3.0* 3.0* 3.4*  CALCIUM 8.3* 7.9* 7.9* 8.1* 8.5*  PHOS  --  5.4* 5.5* 5.7* 5.6*   Liver Function Tests: Recent Labs  Lab 05/05/22 0025 05/06/22 0026 05/07/22 0029  ALBUMIN 3.0* 3.0* 3.4*   No results for input(s): "LIPASE", "AMYLASE" in the last 168 hours. No results for input(s): "AMMONIA" in the last 168 hours. CBC: Recent Labs  Lab 05/04/22 1319 05/05/22 0025 05/06/22 0026  WBC 4.2 4.2 4.5  HGB 7.0* 7.3* 8.7*  HCT 22.8* 23.2* 27.4*  MCV 101.8* 100.4* 97.2  PLT 187 191 178   Cardiac Enzymes: No results for input(s): "CKTOTAL", "CKMB", "CKMBINDEX", "TROPONINI" in the last 168 hours. CBG: Recent Labs  Lab 05/05/22 2138  GLUCAP 105*    Iron Studies:  Recent Labs    05/04/22 2305  IRON 37*  TIBC 192*  FERRITIN 175   Studies/Results: VAS [place] TRANSCRANIAL DOPPLER  Result Date: 05/07/2022  Transcranial Doppler Patient Name:  MCCLAIN SHALL   Date of Exam:   05/06/2022 Medical Rec #: [id number]      Accession #:    [id number] Date of Birth: 21-Oct-1965      Patient Gender: M Patient Age:   49 years Exam Location:  [facility] Procedure:      VAS [place] TRANSCRANIAL DOPPLER Referring Phys: Elwin Sleight DE LA TORRE --------------------------------------------------------------------------------  Indications: Stroke. History: HTN, HLD, SMK, DM, CHF, CKD, CVA. 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 Comparison Study: No previous exams Performing Technologist: Jody Hill RVT, RDMS  Examination Guidelines: A complete evaluation includes B-mode imaging, spectral Doppler, color Doppler, and power Doppler as needed of all accessible portions of each vessel. Bilateral testing is considered an integral part of a complete examination. Limited examinations for reoccurring indications may be performed as noted.  +----------+---------------+----------+-----------+-------------+ RIGHT TCD Right VM (cm/s)Depth (cm)Pulsatility   Comment    +----------+---------------+----------+-----------+-------------+ MCA                                           not insonated +----------+---------------+----------+-----------+-------------+  ACA                                           not insonated +----------+---------------+----------+-----------+-------------+ Term ICA                                      not insonated +----------+---------------+----------+-----------+-------------+ PCA P1                                        not insonated +----------+---------------+----------+-----------+-------------+ Opthalmic                                     not insonated +----------+---------------+----------+-----------+-------------+ ICA siphon                                    not insonated  +----------+---------------+----------+-----------+-------------+ Vertebral       -70                   1.11                  +----------+---------------+----------+-----------+-------------+  +----------+--------------+----------+-----------+-------------+ LEFT TCD  Left VM (cm/s)Depth (cm)Pulsatility   Comment    +----------+--------------+----------+-----------+-------------+ MCA             29                   0.98                  +----------+--------------+----------+-----------+-------------+ ACA            -24                   0.88                  +----------+--------------+----------+-----------+-------------+ Term ICA                                     not insonated +----------+--------------+----------+-----------+-------------+ PCA P1                                       not insonated +----------+--------------+----------+-----------+-------------+ Opthalmic       14                   0.88                  +----------+--------------+----------+-----------+-------------+ ICA siphon      21                   2.00                  +----------+--------------+----------+-----------+-------------+ Vertebral      -31                   1.06                  +----------+--------------+----------+-----------+-------------+ Distal ICA  not insonated +----------+--------------+----------+-----------+-------------+  +------------+-------+-------+             VM cm/sComment +------------+-------+-------+ Prox Basilar  -77   1.12   +------------+-------+-------+ Dist Basilar  -75   1.15   +------------+-------+-------+ Summary:  Absent right temporal and poor biorbital and left temporalwindows limit evaluation of anterior circulation vessels.Mildly elevated mean flow velocities in bilateral vertebral and basilar arteries of unc;lear significance. *See table(s) above for TCD measurements and observations.   Diagnosing physician: Antony Contras MD Electronically signed by Antony Contras MD on 05/07/2022 at 9:14:15 AM.    Final    VAS US CAROTID  Result Date: 05/06/2022 Carotid Arterial Duplex Study Patient Name:  GERMANY CHELF  Date of Exam:   05/06/2022 Medical Rec #: [id number]      Accession #:    [id number] Date of Birth: 03-May-1966      Patient Gender: M Patient Age:   38 years Exam Location:  [facility] Procedure:      VAS US CAROTID Referring Phys: Elwin Sleight DE LA TORRE --------------------------------------------------------------------------------  Indications:   CVA. Risk Factors:  Hypertension, hyperlipidemia, Diabetes, current smoker, prior                CVA. Other Factors: CHF, CKD. Limitations    Today's exam was limited due to heavy calcification and the                resulting shadowing and patient movement. Performing Technologist: Rogelia Rohrer RVT, RDMS  Examination Guidelines: A complete evaluation includes B-mode imaging, spectral Doppler, color Doppler, and power Doppler as needed of all accessible portions of each vessel. Bilateral testing is considered an integral part of a complete examination. Limited examinations for reoccurring indications may be performed as noted.  Right Carotid Findings: +----------+--------+--------+--------+------------------+---------+           PSV cm/sEDV cm/sStenosisPlaque DescriptionComments  +----------+--------+--------+--------+------------------+---------+ CCA Prox  120     18              calcific          Shadowing +----------+--------+--------+--------+------------------+---------+ CCA Distal148     25              calcific          Shadowing +----------+--------+--------+--------+------------------+---------+ ICA Prox  149     35      40-59%  calcific          Shadowing +----------+--------+--------+--------+------------------+---------+ ICA Mid   122     28              calcific          Shadowing  +----------+--------+--------+--------+------------------+---------+ ICA Distal85      19                                          +----------+--------+--------+--------+------------------+---------+ ECA       171                     calcific          shadowing +----------+--------+--------+--------+------------------+---------+ +----------+--------+-------+----------------+-------------------+           PSV cm/sEDV cmsDescribe        Arm Pressure (mmHG) +----------+--------+-------+----------------+-------------------+ [id number]            Multiphasic, WNL                    +----------+--------+-------+----------------+-------------------+ +---------+--------+--+--------+--+---------+  VertebralPSV cm/s75EDV cm/s23Antegrade +---------+--------+--+--------+--+---------+  Left Carotid Findings: +----------+-------+--------+--------+-----------------------+-----------------+           PSV    EDV cm/sStenosisPlaque Description     Comments                    cm/s                                                            +----------+-------+--------+--------+-----------------------+-----------------+ CCA Prox  106    22                                     intimal                                                                   thickening        +----------+-------+--------+--------+-----------------------+-----------------+ CCA Distal113    21              heterogenous and                                                          calcific                                 +----------+-------+--------+--------+-----------------------+-----------------+ ICA Prox  138    39      40-59%  calcific               Shadowing         +----------+-------+--------+--------+-----------------------+-----------------+ ICA Mid   100    24              calcific               Shadowing          +----------+-------+--------+--------+-----------------------+-----------------+ ICA Distal90     23                                                       +----------+-------+--------+--------+-----------------------+-----------------+ ECA       244            >50%    calcific               shadowing         +----------+-------+--------+--------+-----------------------+-----------------+ +----------+--------+--------+--------+-------------------+           PSV cm/sEDV cm/sDescribeArm Pressure (mmHG) +----------+--------+--------+--------+-------------------+ [id number]     63      Stenotic                    +----------+--------+--------+--------+-------------------+ +---------+--------+--+--------+--+---------+ VertebralPSV cm/s76EDV cm/s23Antegrade +---------+--------+--+--------+--+---------+   Summary: Right Carotid:  Velocities in the right ICA are consistent with a 40-59%                stenosis. Left Carotid: Velocities in the left ICA are consistent with a 40-59% stenosis.               The ECA appears >50% stenosed. Vertebrals:  Bilateral vertebral arteries demonstrate antegrade flow. Subclavians: Left subclavian artery was stenotic. Normal flow hemodynamics were              seen in the right subclavian artery. *See table(s) above for measurements and observations.  Electronically signed by [name] MD on 05/06/2022 at 5:56:55 PM.    Final    MR BRAIN WO CONTRAST  Result Date: 05/06/2022 CLINICAL DATA:  Stroke follow-up EXAM: MRI HEAD WITHOUT CONTRAST TECHNIQUE: Multiplanar, multiecho pulse sequences of the brain and surrounding structures were obtained without intravenous contrast. COMPARISON:  Head CT from yesterday FINDINGS: Brain: Subcentimeter areas of restricted diffusion in the subcortical left frontal lobe and temporal occipital region. Small area of restricted diffusion in the posterior right pons. Advanced chronic small vessel ischemia with  extensive gliosis and small vessel infarcts affecting the brainstem, deep gray nuclei, and deep white matter tracks. Premature cerebral volume loss. Chronic microhemorrhages with hypertensive pattern. No acute hemorrhage or hydrocephalus.  No masslike finding. Vascular: Major flow voids are preserved Skull and upper cervical spine: Normal marrow signal. 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 Right more than left mastoid opacification with normal nasopharynx. IMPRESSION: 1. Small acute infarcts in the right posterior pons and subcortical left cerebral hemisphere. 2. Background of severe chronic small vessel ischemia. Electronically Signed   By: Jorje Guild M.D.   On: 05/06/2022 05:29   CT HEAD WO CONTRAST (5MM)  Result Date: 05/05/2022 CLINICAL DATA:  Slurred speech EXAM: CT HEAD WITHOUT CONTRAST TECHNIQUE: Contiguous axial images were obtained from the base of the skull through the vertex without intravenous contrast. RADIATION DOSE REDUCTION: This exam was performed according to the departmental dose-optimization program which includes automated exposure control, adjustment of the mA and/or kV according to patient size and/or use of iterative reconstruction technique. COMPARISON:  01/11/2015 FINDINGS: Brain: No evidence of acute infarction, hemorrhage, hydrocephalus, extra-axial collection or mass lesion/mass effect. Subcortical white matter and periventricular small vessel ischemic changes. Chronic lacunar infarcts in the right corona radiata. Vascular: Intracranial atherosclerosis. Skull: Normal. Negative for fracture or focal lesion. Sinuses/Orbits: Postprocedural changes involving the right globe. The visualized paranasal sinuses are essentially clear. The mastoid air cells are unopacified. Other: None. IMPRESSION: No evidence of acute intracranial abnormality. Small vessel ischemic changes. Chronic lacunar infarcts in the right corona radiata. Electronically Signed   By: Julian Hy M.D.    On: 05/05/2022 21:17   ECHOCARDIOGRAM COMPLETE  Result Date: 05/05/2022    ECHOCARDIOGRAM REPORT   Patient Name:   ARVIS ZWAHLEN Date of Exam: 05/05/2022 Medical Rec #:  [id number]     Height:       73.0 in Accession #:    [id number]    Weight:       180.0 lb Date of Birth:  Oct 13, 1965     BSA:          2.057 m Patient Age:    20 years      BP:           143/91 mmHg Patient Gender: M             HR:  74 bpm. Exam Location:  Inpatient Procedure: 2D Echo, Color Doppler and Cardiac Doppler Indications:    E52.77 Acute diastolic (congestive) heart failure  History:        Patient has prior history of Echocardiogram examinations, most                 recent 07/17/2020. Risk Factors:Hypertension, Diabetes,                 Dyslipidemia, Sleep Apnea and CKD.  Sonographer:    Raquel Sarna Senior RDCS Referring Phys: [id number] [place]  1. Left ventricular ejection fraction, by estimation, is 45 to 50%. The left ventricle has mildly decreased function. The left ventricle demonstrates regional wall motion abnormalities with basal to mid anterolateral and basal to mid inferior hypokinesis. There is mild concentric left ventricular hypertrophy. Left ventricular diastolic parameters are consistent with Grade II diastolic dysfunction (pseudonormalization).  2. Right ventricular systolic function is normal. The right ventricular size is mildly enlarged. There is moderately elevated pulmonary artery systolic pressure. The estimated right ventricular systolic pressure is 61.4 mmHg.  3. Left atrial size was severely dilated.  4. Right atrial size was mildly dilated.  5. The mitral valve is degenerative. Mild to moderate mitral valve regurgitation. No evidence of mitral stenosis. Moderate mitral annular calcification.  6. The aortic valve is tricuspid. There is mild calcification of the aortic valve. Aortic valve regurgitation is not visualized. No aortic stenosis is present.  7. The inferior vena cava is dilated in  size with <50% respiratory variability, suggesting right atrial pressure of 15 mmHg. FINDINGS  Left Ventricle: Left ventricular ejection fraction, by estimation, is 45 to 50%. The left ventricle has mildly decreased function. The left ventricle demonstrates regional wall motion abnormalities. The left ventricular internal cavity size was normal in size. There is mild concentric left ventricular hypertrophy. Left ventricular diastolic parameters are consistent with Grade II diastolic dysfunction (pseudonormalization). Right Ventricle: The right ventricular size is mildly enlarged. No increase in right ventricular wall thickness. Right ventricular systolic function is normal. There is moderately elevated pulmonary artery systolic pressure. The tricuspid regurgitant velocity is 3.01 m/s, and with an assumed right atrial pressure of 15 mmHg, the estimated right ventricular systolic pressure is 43.1 mmHg. Left Atrium: Left atrial size was severely dilated. Right Atrium: Right atrial size was mildly dilated. Pericardium: Trivial pericardial effusion is present. Mitral Valve: The mitral valve is degenerative in appearance. There is moderate calcification of the mitral valve leaflet(s). Moderate mitral annular calcification. Mild to moderate mitral valve regurgitation. No evidence of mitral valve stenosis. MV peak gradient, 6.9 mmHg. The mean mitral valve gradient is 3.0 mmHg. Tricuspid Valve: The tricuspid valve is normal in structure. Tricuspid valve regurgitation is trivial. Aortic Valve: The aortic valve is tricuspid. There is mild calcification of the aortic valve. Aortic valve regurgitation is not visualized. No aortic stenosis is present. Aortic valve mean gradient measures 6.0 mmHg. Aortic valve peak gradient measures 13.2 mmHg. Aortic valve area, by VTI measures 2.16 cm. Pulmonic Valve: The pulmonic valve was normal in structure. Pulmonic valve regurgitation is not visualized. Aorta: The aortic root is normal in  size and structure. Venous: The inferior vena cava is dilated in size with less than 50% respiratory variability, suggesting right atrial pressure of 15 mmHg. IAS/Shunts: No atrial level shunt detected by color flow Doppler.  LEFT VENTRICLE PLAX 2D LVIDd:         5.60 cm      Diastology LVIDs:  4.30 cm      LV e' medial:    4.23 cm/s LV PW:         1.50 cm      LV E/e' medial:  26.7 LV IVS:        1.10 cm      LV e' lateral:   7.14 cm/s LVOT diam:     1.80 cm      LV E/e' lateral: 15.8 LV SV:         75 LV SV Index:   37 LVOT Area:     2.54 cm  LV Volumes (MOD) LV vol d, MOD A2C: 173.0 ml LV vol d, MOD A4C: 182.0 ml LV vol s, MOD A2C: 88.1 ml LV vol s, MOD A4C: 84.7 ml LV SV MOD A2C:     84.9 ml LV SV MOD A4C:     182.0 ml LV SV MOD BP:      88.4 ml RIGHT VENTRICLE RV S prime:     12.40 cm/s TAPSE (M-mode): 2.4 cm LEFT ATRIUM              Index        RIGHT ATRIUM           Index LA diam:        5.30 cm  2.58 cm/m   RA Area:     21.80 cm LA Vol (A2C):   121.0 ml 58.81 ml/m  RA Volume:   66.30 ml  32.23 ml/m LA Vol (A4C):   126.0 ml 61.24 ml/m LA Biplane Vol: 127.0 ml 61.73 ml/m  AORTIC VALVE AV Area (Vmax):    1.85 cm AV Area (Vmean):   2.27 cm AV Area (VTI):     2.16 cm AV Vmax:           182.00 cm/s AV Vmean:          112.000 cm/s AV VTI:            0.349 m AV Peak Grad:      13.2 mmHg AV Mean Grad:      6.0 mmHg LVOT Vmax:         132.00 cm/s LVOT Vmean:        99.900 cm/s LVOT VTI:          0.296 m LVOT/AV VTI ratio: 0.85  AORTA Ao Root diam: 3.20 cm Ao Asc diam:  3.00 cm MITRAL VALVE                  TRICUSPID VALVE MV Area (PHT): 4.71 cm       TR Peak grad:   36.2 mmHg MV Area VTI:   2.22 cm       TR Vmax:        301.00 cm/s MV Peak grad:  6.9 mmHg MV Mean grad:  3.0 mmHg       SHUNTS MV Vmax:       1.31 m/s       Systemic VTI:  0.30 m MV Vmean:      81.0 cm/s      Systemic Diam: 1.80 cm MV Decel Time: 161 msec MR Peak grad:    78.9 mmHg MR Mean grad:    43.0 mmHg MR Vmax:         444.00  cm/s MR Vmean:        320.0 cm/s MR PISA:         3.08 cm MR PISA Eff ROA: 21  mm MR PISA Radius:  0.70 cm MV E velocity: 113.00 cm/s MV A velocity: 40.50 cm/s MV E/A ratio:  2.79 Dalton McleanMD Electronically signed by Franki Monte Signature Date/Time: 05/05/2022/10:58:03 AM    Final     sodium chloride   Intravenous Once   aspirin EC  81 mg Oral QPM   atorvastatin  40 mg Oral Daily   clopidogrel  75 mg Oral Daily   darbepoetin (ARANESP) injection - NON-DIALYSIS  60 mcg Subcutaneous Q Thu-1800   loratadine  10 mg Oral Daily   pantoprazole (PROTONIX) IV  40 mg Intravenous Q24H   sevelamer carbonate  800 mg Oral TID WC   sodium bicarbonate  650 mg Oral BID    BMET    Component Value Date/Time   NA 139 05/07/2022 0029   NA 139 04/07/2021 1127   K 4.8 05/07/2022 0029   CL 111 05/07/2022 0029   CO2 17 (L) 05/07/2022 0029   GLUCOSE 96 05/07/2022 0029   BUN 57 (H) 05/07/2022 0029   BUN 51 (H) 04/07/2021 1127   CREATININE 10.45 (H) 05/07/2022 0029   CALCIUM 8.5 (L) 05/07/2022 0029   GFRNONAA 5 (L) 05/07/2022 0029   GFRAA 9 (L) 04/23/2020 0955   CBC    Component Value Date/Time   WBC 4.5 05/06/2022 0026   RBC 2.82 (L) 05/06/2022 0026   HGB 8.7 (L) 05/06/2022 0026   HGB 9.5 (L) 04/07/2021 1127   HCT 27.4 (L) 05/06/2022 0026   HCT 28.9 (L) 04/07/2021 1127   PLT 178 05/06/2022 0026   PLT 200 04/07/2021 1127   MCV 97.2 05/06/2022 0026   MCV 93 04/07/2021 1127   MCH 30.9 05/06/2022 0026   MCHC 31.8 05/06/2022 0026   RDW 16.1 (H) 05/06/2022 0026   RDW 14.5 04/07/2021 1127   LYMPHSABS 0.8 04/07/2021 1127   MONOABS 0.4 07/22/2017 0119   EOSABS 0.2 04/07/2021 1127   BASOSABS 0.1 04/07/2021 1127    Assessment/Plan: Acute CVA's - small acute infarcts in right posterior pons and subcortical left cerebral hemisphere.  Underlying severe chronic small vessel ischemia.  Neuro following.  CKD Stage V - presenting with some mild uremic symptoms.  He has a functional LUE AVF.  He has  changed his mind and is amenable for a short trial of HD to see if he tolerates it.  He has not committed to long term dialysis at this time but will agree to one today.  If he does not tolerate it, or wont commit to outpatient HD, he would be open to go to home with hospice.  Will continue to educate.  Plan for first, short HD session today after [facility] placement.   Avoid nephrotoxic medications including NSAIDs and iodinated intravenous contrast exposure unless the latter is absolutely indicated.   Preferred narcotic agents for pain control are hydromorphone, fentanyl, and methadone. Morphine should not be used.  Avoid Baclofen and avoid oral sodium phosphate and magnesium citrate based laxatives / bowel preps.  Continue strict Input and Output monitoring.  Will monitor the patient closely with you and intervene or adjust therapy as indicated by changes in clinical status/labs  Vascular access - despite having the LUE BVT 2 years ago, it is not yet mature and will require second surgery.  Have consulted IR for [facility] placement and is on schedule today. Chest pain - likely due to demand ischemia from profound anemia, however he has many risk factors for CAD.  Recommend cardiology evaluation, however this will be  limited if he refuses dialysis.  Workup on hold given acute CVA. Hyperkalemia - due to CKD.  Start Lokelma 10 grams daily and start renal diet.  Improved. Anemia of CKD stage V - agree with blood transfusion and will check iron stores and start ESA Chronic combined systolic and diastolic CHF - BNP 3953.  Metabolic acidosis - will start oral sodium bicarb HTN - poorly controlled.  Currently only taking clonidine 0.1 mg tid but had been on carvedilol, hydralazine, and imdur in the past.  Allowing passive HTN per neuro due to recent ischemic strokes. 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  Phos elevated will start binders and follow. Itching - likely due to uremia.  Started atarax and  follow. Tobacco abuse - recommend cessation. 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  Chronic back pain - continue with percocet per primary.  Donetta Potts, MD [facility]

## 2022-05-07 NOTE — Consult Note (Signed)
   NAME:  Cody Webster, MRN:  [id number], DOB:  11/13/1965, LOS: 2 ADMISSION DATE:  05/04/2022, CONSULTATION DATE:  05/07/22 REFERRING MD:  Dr Doristine Bosworth, CHIEF COMPLAINT: Need for dialysis access  History of Present Illness:  56 year old with diastolic heart failure, hypertension, diabetes, latent TB chronic kidney disease stage V, hypertension Asked to see him for dialysis catheter placement Admitted with chest pain, systolic heart failure, stroke 12/21  He has unstable angina, needs dialysis -Does have uremic symptoms, followed up by renal  He has a left upper extremity AV fistula  Pertinent  Medical History   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   Depression    Diabetes mellitus without complication ([place])    type 2   GERD (gastroesophageal reflux disease)    Hypertension    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 ([place]) 2015   EF now improved back to normal mildly reduced   Peripheral neuropathy    Pneumonia    PTSD (post-traumatic stress disorder)    Seizures ([place])    27 years ago   Significant Hospital Events: Including procedures, antibiotic start and stop dates in addition to other pertinent events   CT head 12/21-no acute findings Echocardiogram 12/21-ejection fraction 45 to 50%  Interim History / Subjective:  Denies any complaints at present, was complaining of chest pain prior  Objective   Blood pressure (!) 171/86, pulse 92, temperature 98.2 F (36.8 C), temperature source Oral, resp. rate 16, height '6\' 1"'$  (1.854 m), weight 77.5 kg, SpO2 98 %.        Intake/Output Summary (Last 24 hours) at 05/07/2022 1311 Last data filed at 05/07/2022 1129 Gross per 24 hour  Intake --  Output 500 ml  Net -500 ml   Filed Weights   05/04/22 1235 05/05/22 2300  Weight: 81.6 kg 77.5 kg    Examination: General:  Middle-aged, chronically ill-appearing HENT: Moist oral mucosa Lungs: Clear breath sounds Cardiovascular: S1-S2 appreciated Abdomen: Soft, bowel sounds appreciated Extremities: No clubbing Neuro: Awake alert interactive GU:   Resolved Hospital Problem list     Assessment & Plan:  Patient with CVA Chronic kidney disease stage V Difficulty with vascular access  Interventional radiology reached out to assist with vascular access placement for temporary dialysis -Will try and arrange the same  Combined systolic and diastolic heart failure Metabolic acidosis Chronic kidney disease  History of tobacco abuse  Will assist with line placement  Sherrilyn Rist, MD [place] [facility] Pager: See Shea Evans

## 2022-05-07 NOTE — Consult Note (Signed)
Palliative Medicine Inpatient Consult Note  Consulting Provider:  Jonnie Finner, DO   Reason for consult:   [place] [facility] Consult  Reason for Consult? Goal of care   05/07/2022  HPI:  Per intake H&P --> Cody Webster is a 56 y.o. male with medical history significant of CKD5, HTN, GERD, latent TB. Presented with chest pain. He reports that he was on medication for latent TB, but he stopped because he thought it was making him sick. No other complaint.  Admitted to hospital service at [facility].  Cardiology and nephrology consulted.  Nephrology recommended hemodialysis, he has functional LUE AVF but patient refused dialysis in the beginning but eventually agreed and he was transferred to [facility] for that reason.  Cardiology also followed him.  They are recommending cardiac cath at some point in time during this hospitalization.  On the evening of 05/05/2022, patient was noted to have dysarthria and facial droop and code stroke was called.  MRI shows acute infarct in right posterior pons and subcortical left cerebral hemisphere.  Neurology consulted.   Palliative care consulted in the setting of chronic disease burden for further conversations related to goals of care.  Clinical Assessment/Goals of Care:  *Please note that this is a verbal dictation therefore any spelling or grammatical errors are due to the "[place] [employer]" system interpretation.  I have reviewed medical records including EPIC notes, labs and imaging, received report from bedside RN, assessed the patient who is sitting up in bed in no acute distress.    I met with Cody Webster to further discuss diagnosis prognosis, GOC, EOL wishes, disposition and options.   I introduced Palliative Medicine as specialized medical care for people living with serious illness. It focuses on providing relief from the symptoms and stress of a serious illness. The goal is to improve  quality of life for both the patient and the family.  Medical History Review and Understanding:  Cody Webster and I reviewed his past medical history of chronic kidney disease which is now evolved into end-stage renal disease.  Hypertension, latent TB infection, and recent CVA.  Social History:  Cody Webster is from [facility] originally.  He has never been married though he has 1 son Cody Webster who lives in [place].  He is close to his siblings and has a niece and nephew with whom he communicates with regularly.  He formerly was in [employer] though unfortunately has been disabled for a number of years.  He is a man of faith and practices within [employer] denomination to be more specific.  Functional and Nutritional State:  Prior to hospitalization Cody Webster was living with his brother in an apartment.  He shares that he is able to participate in self-care though does feel overall a lot weaker than he had been prior.  His appetite has been dwindling.  He is an active smoker and tends to smoke cigarettes in the morning hours.  Palliative Symptoms:  Generalized weakness-physical therapy and Occupational Therapy have been asked to work with patient.  Itching-patient does not feel Benadryl helps him.  We talked about hydralazine when she does get though not scheduled so reviewed the idea of scheduling this medication to better support this symptom.  Diminished appetite-we reviewed talking to a dietitian and trying to offer more palatable foods.  Advance Directives:  A detailed discussion was had today regarding advanced directives.  Patient shares that he does have advanced directives  and has named his niece, Cody Webster is his primary Media planner.  Code Status:  Concepts specific to code status, artifical feeding and hydration, continued IV antibiotics and rehospitalization was had.  The difference between a aggressive medical intervention path  and a  palliative comfort care path for this patient at this time was had.   Encouraged patient/family to consider DNR/DNI status understanding evidenced based poor outcomes in similar hospitalized patient, as the cause of arrest is likely associated with advanced chronic/terminal illness rather than an easily reversible acute cardio-pulmonary event. I explained that DNR/DNI does not change the medical plan and it only comes into effect after a person has arrested (died).  It is a protective measure to keep [place] from harming the patient in their last moments of life. [name] was agreeable to DNR/DNI with understanding that patient would not receive CPR, defibrillation, ACLS medications, or intubation.   Discussion:  [name] and I talked about his new initiation of dialysis.  He expresses to me that in all honesty he did not desire to start dialysis though after speaking with his family he is shared with them he will do it although if he determines that it causes him more pain and/or distress he will elect to stop the treatments.  [name] is aware that without hemodialysis his life would be short.  If for any reason he does not tolerate treatment or does not desire to continue with treatments we talked about the idea of hospice care.  I described hospice as a service for patients who have a life expectancy of 6 months or less.  The goal of hospice is the preservation of dignity and quality at the end phases of life. Under hospice care, the focus changes from curative to symptom relief.  He shares that the nephrologist and primary medical team have already spoken to him about this and if for any reason he elects to not do dialysis moving forward he would be open to the thought of hospice in his home.  He shares that his niece and nephew would be able to care for him.  For the time being Mode goals are to try dialysis to see if it is something which he is able to tolerate long-term.  Cody Webster and I spoke at length about  a variety of frustrations he has experienced since being hospitalized.  I was able to provide empathetic support through therapeutic listening.  Patient shares that he feels staff needs to have more compassion for him as he has been in his body his whole life and the staff do not know everything about him.  He uses an incident from yesterday whereby he was having chest pain and he felt like nobody cared.  It was able to speak to patient's RN after having met with Cody Webster and advocate in the setting of his concerns.  Discussed the importance of continued conversation with family and their  medical providers regarding overall plan of care and treatment options, ensuring decisions are within the context of the patients values and GOCs.  Decision Maker: Patient is presently able to make decisions for himself though i for any reason he were incapacitated he would desire his niece, Cody Webster (Niece): [phone] (Home Phone) to support making decisions for him  SUMMARY OF RECOMMENDATIONS   DNAR/DNI  Plan for temporary dialysis catheter placement and for patient to receive hemodialysis  Open and honest conversations were held with Cody Webster if he were to elect to not pursue dialysis or to stop dialysis  Discussed the option of hospice if he determines he no longer wants to pursue aggressive treatment options  Plan for continued support  Code Status/Advance Care Planning: DNAR/DNI   Symptom Management:  Generalized Weakness: - PT/OT - Daily mobility  Itching: - Atarax TID ATC - Benadryl 12.60m IV Q8H PRN  Adult FTT: - Dietician counseling - Offer more palatable food choices - Consider low dose mirtazapine if not documented improvement   Palliative Prophylaxis:  Aspiration, Bowel Regimen, Delirium Protocol, Frequent Pain Assessment, Oral Care, Palliative Wound Care, and Turn Reposition  Additional Recommendations (Limitations, Scope, Preferences): Continue current  care  Psycho-social/Spiritual:  Desire for further Chaplaincy support: Not presently Additional Recommendations: Education on ESRD   Prognosis: Unclear now that patient has elected to pursue HD.   Discharge Planning: Discharge plan to be determined.  Vitals:   05/06/22 1958 05/07/22 0000  BP: (!) 181/85 (!) 175/76  Pulse: 90 100  Resp: 17 20  Temp: 98.5 F (36.9 C) 98.5 F (36.9 C)  SpO2: 94% 94%    Intake/Output Summary (Last 24 hours) at 05/07/2022 00354Last data filed at 05/06/2022 2201 Gross per 24 hour  Intake 240 ml  Output 200 ml  Net 40 ml   Last Weight  Most recent update: 05/05/2022 11:32 PM    Weight  77.5 kg (170 lb 13.7 oz)            Gen:  Middle aged [place] [address] HEENT: moist mucous membranes CV: Irregular rate and rhythm  PULM: On RA, breathing is even and nonlabored ABD: soft/nontender EXT: No edema Neuro: Alert and oriented x3  PPS: 50-60%   This conversation/these recommendations were discussed with patient primary care team, Dr. PDoristine Bosworth Total Time: 95  Billing based on MDM: High  Problems Addressed: One acute or chronic illness or injury that poses a threat to life or bodily function  Amount and/or Complexity of Data: Category 3:Discussion of management or test interpretation with external physician/other qualified health care professional/appropriate source (not separately reported)  Risks: Decision regarding hospitalization or escalation of hospital care and Decision not to resuscitate or to de-escalate care because of poor prognosis ______________________________________________________ [facility] Team Cell Phone: [phone] utilize secure chat with additional questions, if there is no response within 30 minutes please call the above phone number  Palliative Medicine Team providers are available by phone from 7am to 7pm daily and can be reached through the team cell phone.   Should this patient require assistance outside of these hours, please call the patient's attending physician.

## 2022-05-07 NOTE — Progress Notes (Signed)
HD catheter placed uneventfully  Chest x-ray reviewed, no pneumothorax  Dialysis catheter may be used  Call PCCM if needed  Sherrilyn Rist, MD [place] [facility] Pager: See Shea Evans

## 2022-05-08 ENCOUNTER — Inpatient Hospital Stay (HOSPITAL_COMMUNITY): Payer: Medicaid Other

## 2022-05-08 DIAGNOSIS — R072 Precordial pain: Secondary | ICD-10-CM | POA: Diagnosis not present

## 2022-05-08 DIAGNOSIS — Z515 Encounter for palliative care: Secondary | ICD-10-CM | POA: Diagnosis not present

## 2022-05-08 DIAGNOSIS — I5042 Chronic combined systolic (congestive) and diastolic (congestive) heart failure: Secondary | ICD-10-CM | POA: Diagnosis not present

## 2022-05-08 DIAGNOSIS — Z7189 Other specified counseling: Secondary | ICD-10-CM | POA: Diagnosis not present

## 2022-05-08 DIAGNOSIS — Q283 Other malformations of cerebral vessels: Secondary | ICD-10-CM | POA: Diagnosis not present

## 2022-05-08 DIAGNOSIS — D649 Anemia, unspecified: Secondary | ICD-10-CM | POA: Diagnosis not present

## 2022-05-08 DIAGNOSIS — R079 Chest pain, unspecified: Secondary | ICD-10-CM | POA: Diagnosis not present

## 2022-05-08 DIAGNOSIS — Z66 Do not resuscitate: Secondary | ICD-10-CM | POA: Diagnosis not present

## 2022-05-08 DIAGNOSIS — E875 Hyperkalemia: Secondary | ICD-10-CM | POA: Diagnosis not present

## 2022-05-08 DIAGNOSIS — I6302 Cerebral infarction due to thrombosis of basilar artery: Secondary | ICD-10-CM | POA: Diagnosis not present

## 2022-05-08 DIAGNOSIS — I132 Hypertensive heart and chronic kidney disease with heart failure and with stage 5 chronic kidney disease, or end stage renal disease: Secondary | ICD-10-CM | POA: Diagnosis not present

## 2022-05-08 DIAGNOSIS — I5021 Acute systolic (congestive) heart failure: Secondary | ICD-10-CM | POA: Diagnosis not present

## 2022-05-08 DIAGNOSIS — N186 End stage renal disease: Secondary | ICD-10-CM | POA: Diagnosis not present

## 2022-05-08 DIAGNOSIS — I5041 Acute combined systolic (congestive) and diastolic (congestive) heart failure: Secondary | ICD-10-CM | POA: Diagnosis not present

## 2022-05-08 DIAGNOSIS — I200 Unstable angina: Secondary | ICD-10-CM | POA: Diagnosis not present

## 2022-05-08 DIAGNOSIS — R0602 Shortness of breath: Secondary | ICD-10-CM | POA: Diagnosis not present

## 2022-05-08 DIAGNOSIS — N185 Chronic kidney disease, stage 5: Secondary | ICD-10-CM | POA: Diagnosis not present

## 2022-05-08 DIAGNOSIS — I509 Heart failure, unspecified: Secondary | ICD-10-CM | POA: Diagnosis not present

## 2022-05-08 LAB — RENAL FUNCTION PANEL
Albumin: 3 g/dL — ABNORMAL LOW (ref 3.5–5.0)
Anion gap: 11 (ref 5–15)
BUN: 31 mg/dL — ABNORMAL HIGH (ref 6–20)
CO2: 24 mmol/L (ref 22–32)
Calcium: 8.5 mg/dL — ABNORMAL LOW (ref 8.9–10.3)
Chloride: 103 mmol/L (ref 98–111)
Creatinine, Ser: 6.63 mg/dL — ABNORMAL HIGH (ref 0.61–1.24)
GFR, Estimated: 9 mL/min — ABNORMAL LOW (ref >60)
Glucose, Bld: 74 mg/dL (ref 70–99)
Phosphorus: 3 mg/dL (ref 2.5–4.6)
Potassium: 3.7 mmol/L (ref 3.5–5.1)
Sodium: 138 mmol/L (ref 135–145)

## 2022-05-08 MED ORDER — ANTICOAGULANT SODIUM CITRATE 4% (200MG/5ML) IV SOLN: 5.0000 mL | Status: DC | PRN

## 2022-05-08 MED ORDER — LIDOCAINE HCL (PF) 1 % IJ SOLN: 5.0000 mL | INTRAMUSCULAR | Status: DC | PRN

## 2022-05-08 MED ORDER — LORAZEPAM 2 MG/ML IJ SOLN
0.5000 mg | Freq: Once | INTRAMUSCULAR | Status: AC
  Administered 2022-05-08: 0.5 mg via INTRAVENOUS
  Filled 2022-05-08: qty 1

## 2022-05-08 MED ORDER — PANTOPRAZOLE SODIUM 40 MG PO TBEC
40.0000 mg | DELAYED_RELEASE_TABLET | Freq: Every day | ORAL | Status: DC
  Administered 2022-05-08 – 2022-05-11 (×4): 40 mg via ORAL
  Filled 2022-05-08 (×4): qty 1

## 2022-05-08 MED ORDER — HYDRALAZINE HCL 50 MG PO TABS
100.0000 mg | ORAL_TABLET | Freq: Three times a day (TID) | ORAL | Status: DC
  Administered 2022-05-08 – 2022-05-11 (×10): 100 mg via ORAL
  Filled 2022-05-08 (×10): qty 2

## 2022-05-08 MED ORDER — CARVEDILOL 12.5 MG PO TABS
12.5000 mg | ORAL_TABLET | Freq: Two times a day (BID) | ORAL | Status: DC
  Administered 2022-05-08 – 2022-05-09 (×4): 12.5 mg via ORAL
  Filled 2022-05-08 (×4): qty 1

## 2022-05-08 MED ORDER — ALTEPLASE 2 MG IJ SOLR: 2.0000 mg | Freq: Once | INTRAMUSCULAR | Status: DC | PRN

## 2022-05-08 MED ORDER — RENA-VITE PO TABS
1.0000 | ORAL_TABLET | Freq: Every day | ORAL | Status: DC
  Administered 2022-05-08 – 2022-05-11 (×4): 1 via ORAL
  Filled 2022-05-08 (×4): qty 1

## 2022-05-08 MED ORDER — PENTAFLUOROPROP-TETRAFLUOROETH EX AERO: 1.0000 | INHALATION_SPRAY | CUTANEOUS | Status: DC | PRN

## 2022-05-08 MED ORDER — DIPHENHYDRAMINE HCL 12.5 MG/5ML PO ELIX
12.5000 mg | ORAL_SOLUTION | Freq: Three times a day (TID) | ORAL | Status: DC | PRN
  Administered 2022-05-09 – 2022-05-10 (×2): 12.5 mg via ORAL
  Filled 2022-05-08 (×3): qty 5

## 2022-05-08 MED ORDER — NEPRO/CARBSTEADY PO LIQD
237.0000 mL | Freq: Two times a day (BID) | ORAL | Status: DC
  Administered 2022-05-09 – 2022-05-11 (×3): 237 mL via ORAL

## 2022-05-08 MED ORDER — ISOSORBIDE MONONITRATE ER 30 MG PO TB24
30.0000 mg | ORAL_TABLET | Freq: Every day | ORAL | Status: DC
  Administered 2022-05-08 – 2022-05-09 (×2): 30 mg via ORAL
  Filled 2022-05-08 (×2): qty 1

## 2022-05-08 MED ORDER — HEPARIN SODIUM (PORCINE) 1000 UNIT/ML DIALYSIS
1000.0000 [IU] | INTRAMUSCULAR | Status: DC | PRN
  Filled 2022-05-08: qty 1

## 2022-05-08 MED ORDER — PROSOURCE PLUS PO LIQD
30.0000 mL | Freq: Two times a day (BID) | ORAL | Status: DC
  Administered 2022-05-08 – 2022-05-12 (×6): 30 mL via ORAL
  Filled 2022-05-08 (×5): qty 30

## 2022-05-08 MED ORDER — LIDOCAINE-PRILOCAINE 2.5-2.5 % EX CREA: 1.0000 | TOPICAL_CREAM | CUTANEOUS | Status: DC | PRN

## 2022-05-08 NOTE — Progress Notes (Signed)
VASCULAR SURGERY:  His duplex of his left first stage basilic vein transposition is still pending.  Gae Gallop, MD 6:34 AM

## 2022-05-08 NOTE — Progress Notes (Signed)
Left basilic vein surveillance has been completed. Preliminary results can be found in CV Proc through chart review.   05/08/22 11:26 AM Cody Webster RVT

## 2022-05-08 NOTE — Progress Notes (Signed)
Patient ID: Cody Webster, male   DOB: 09-Dec-1965, 56 y.o.   MRN: [id number] S: Tolerated HD well but didn't finish until early this morning O:BP (!) 164/90 (BP Location: Right Arm)   Pulse 89   Temp 98 F (36.7 C) (Oral)   Resp (!) 22   Ht '6\' 1"'$  (1.854 m)   Wt 75.1 kg   SpO2 98%   BMI 21.84 kg/m   Intake/Output Summary (Last 24 hours) at 05/08/2022 0947 Last data filed at 05/08/2022 0600 Gross per 24 hour  Intake --  Output 1100 ml  Net -1100 ml   Intake/Output: I/O last 3 completed shifts: In: -  Out: 1300 [Urine:800; Other:500]  Intake/Output this shift:  No intake/output data recorded. Weight change:  Gen: NAD CVS: RRR Resp: CTA Abd: +BS, soft, NT/ND Ext: no edema, LUE AVF +T/B  Recent Labs  Lab 05/04/22 1319 05/04/22 2251 05/05/22 0025 05/06/22 0026 05/07/22 0029 05/08/22 0548  NA 137 135 137 138 139 138  K 5.5* 5.0 4.8 4.8 4.8 3.7  CL 109 108 109 110 111 103  CO2 20* 18* 19* 18* 17* 24  GLUCOSE 83 106* 84 88 96 74  BUN 54* 56* 58* 59* 57* 31*  CREATININE 9.84* 9.59* 9.72* 10.19* 10.45* 6.63*  ALBUMIN  --  3.2* 3.0* 3.0* 3.4* 3.0*  CALCIUM 8.3* 7.9* 7.9* 8.1* 8.5* 8.5*  PHOS  --  5.4* 5.5* 5.7* 5.6* 3.0   Liver Function Tests: Recent Labs  Lab 05/06/22 0026 05/07/22 0029 05/08/22 0548  ALBUMIN 3.0* 3.4* 3.0*   No results for input(s): "LIPASE", "AMYLASE" in the last 168 hours. No results for input(s): "AMMONIA" in the last 168 hours. CBC: Recent Labs  Lab 05/04/22 1319 05/05/22 0025 05/06/22 0026  WBC 4.2 4.2 4.5  HGB 7.0* 7.3* 8.7*  HCT 22.8* 23.2* 27.4*  MCV 101.8* 100.4* 97.2  PLT 187 191 178   Cardiac Enzymes: No results for input(s): "CKTOTAL", "CKMB", "CKMBINDEX", "TROPONINI" in the last 168 hours. CBG: Recent Labs  Lab 05/05/22 2138  GLUCAP 105*    Iron Studies: No results for input(s): "IRON", "TIBC", "TRANSFERRIN", "FERRITIN" in the last 72 hours. Studies/Results: DG CHEST PORT 1 VIEW  Result Date: 05/07/2022 CLINICAL  DATA:  Status post insertion of hemodialysis catheter. EXAM: PORTABLE CHEST 1 VIEW COMPARISON:  Chest radiograph 05/04/2022 and earlier FINDINGS: Interval placement of right IJ central venous catheter with the distal tip of the level of the mid SVC. Stable enlarged cardiomediastinal silhouette which could be exaggerated secondary to portable technique and patient positioning. Aortic calcifications. No focal consolidation, pleural effusion, or pneumothorax. IMPRESSION: Interval placement of right IJ central venous catheter with the distal tip of the level of the mid SVC. Electronically Signed   By: Ileana Roup M.D.   On: 05/07/2022 14:45   VAS [place] TRANSCRANIAL DOPPLER  Result Date: 05/07/2022  Transcranial Doppler Patient Name:  Cody Webster  Date of Exam:   05/06/2022 Medical Rec #: [id number]      Accession #:    [id number] Date of Birth: 1966/01/28      Patient Gender: M Patient Age:   23 years Exam Location:  [facility] Procedure:      VAS [place] TRANSCRANIAL DOPPLER Referring Phys: Elwin Sleight DE LA TORRE --------------------------------------------------------------------------------  Indications: Stroke. History: HTN, HLD, SMK, DM, CHF, CKD, CVA. 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 Comparison Study: No  previous exams Performing Technologist: Jody Hill RVT, RDMS  Examination Guidelines: A complete evaluation includes B-mode imaging, spectral Doppler, color Doppler, and power Doppler as needed of all accessible portions of each vessel. Bilateral testing is considered an integral part of a complete examination. Limited examinations for reoccurring indications may be performed as noted.  +----------+---------------+----------+-----------+-------------+ RIGHT TCD Right VM (cm/s)Depth (cm)Pulsatility   Comment     +----------+---------------+----------+-----------+-------------+ MCA                                           not insonated +----------+---------------+----------+-----------+-------------+ ACA                                           not insonated +----------+---------------+----------+-----------+-------------+ Term ICA                                      not insonated +----------+---------------+----------+-----------+-------------+ PCA P1                                        not insonated +----------+---------------+----------+-----------+-------------+ Opthalmic                                     not insonated +----------+---------------+----------+-----------+-------------+ ICA siphon                                    not insonated +----------+---------------+----------+-----------+-------------+ Vertebral       -70                   1.11                  +----------+---------------+----------+-----------+-------------+  +----------+--------------+----------+-----------+-------------+ LEFT TCD  Left VM (cm/s)Depth (cm)Pulsatility   Comment    +----------+--------------+----------+-----------+-------------+ MCA             29                   0.98                  +----------+--------------+----------+-----------+-------------+ ACA            -24                   0.88                  +----------+--------------+----------+-----------+-------------+ Term ICA                                     not insonated +----------+--------------+----------+-----------+-------------+ PCA P1                                       not insonated +----------+--------------+----------+-----------+-------------+ Opthalmic       14  0.88                  +----------+--------------+----------+-----------+-------------+ ICA siphon      21                   2.00                   +----------+--------------+----------+-----------+-------------+ Vertebral      -31                   1.06                  +----------+--------------+----------+-----------+-------------+ Distal ICA                                   not insonated +----------+--------------+----------+-----------+-------------+  +------------+-------+-------+             VM cm/sComment +------------+-------+-------+ Prox Basilar  -77   1.12   +------------+-------+-------+ Dist Basilar  -75   1.15   +------------+-------+-------+ Summary:  Absent right temporal and poor biorbital and left temporalwindows limit evaluation of anterior circulation vessels.Mildly elevated mean flow velocities in bilateral vertebral and basilar arteries of unc;lear significance. *See table(s) above for TCD measurements and observations.  Diagnosing physician: Antony Contras MD Electronically signed by Antony Contras MD on 05/07/2022 at 9:14:15 AM.    Final    VAS US CAROTID  Result Date: 05/06/2022 Carotid Arterial Duplex Study Patient Name:  GABREAL WORTON  Date of Exam:   05/06/2022 Medical Rec #: [id number]      Accession #:    [id number] Date of Birth: 10/10/1965      Patient Gender: M Patient Age:   61 years Exam Location:  [facility] Procedure:      VAS US CAROTID Referring Phys: Elwin Sleight DE [place] TORRE --------------------------------------------------------------------------------  Indications:   CVA. Risk Factors:  Hypertension, hyperlipidemia, Diabetes, current smoker, prior                CVA. Other Factors: CHF, CKD. Limitations    Today's exam was limited due to heavy calcification and the                resulting shadowing and patient movement. Performing Technologist: Rogelia Rohrer RVT, RDMS  Examination Guidelines: A complete evaluation includes B-mode imaging, spectral Doppler, color Doppler, and power Doppler as needed of all accessible portions of each vessel. Bilateral testing is considered an integral part of a  complete examination. Limited examinations for reoccurring indications may be performed as noted.  Right Carotid Findings: +----------+--------+--------+--------+------------------+---------+           PSV cm/sEDV cm/sStenosisPlaque DescriptionComments  +----------+--------+--------+--------+------------------+---------+ CCA Prox  120     18              calcific          Shadowing +----------+--------+--------+--------+------------------+---------+ CCA Distal148     25              calcific          Shadowing +----------+--------+--------+--------+------------------+---------+ ICA Prox  149     35      40-59%  calcific          Shadowing +----------+--------+--------+--------+------------------+---------+ ICA Mid   122     28              calcific          Shadowing +----------+--------+--------+--------+------------------+---------+ ICA Distal85  19                                          +----------+--------+--------+--------+------------------+---------+ ECA       171                     calcific          shadowing +----------+--------+--------+--------+------------------+---------+ +----------+--------+-------+----------------+-------------------+           PSV cm/sEDV cmsDescribe        Arm Pressure (mmHG) +----------+--------+-------+----------------+-------------------+ [id number]            Multiphasic, WNL                    +----------+--------+-------+----------------+-------------------+ +---------+--------+--+--------+--+---------+ VertebralPSV cm/s75EDV cm/s23Antegrade +---------+--------+--+--------+--+---------+  Left Carotid Findings: +----------+-------+--------+--------+-----------------------+-----------------+           PSV    EDV cm/sStenosisPlaque Description     Comments                    cm/s                                                             +----------+-------+--------+--------+-----------------------+-----------------+ CCA Prox  106    22                                     intimal                                                                   thickening        +----------+-------+--------+--------+-----------------------+-----------------+ CCA Distal113    21              heterogenous and                                                          calcific                                 +----------+-------+--------+--------+-----------------------+-----------------+ ICA Prox  138    39      40-59%  calcific               Shadowing         +----------+-------+--------+--------+-----------------------+-----------------+ ICA Mid   100    24              calcific               Shadowing         +----------+-------+--------+--------+-----------------------+-----------------+ ICA Distal90     23                                                       +----------+-------+--------+--------+-----------------------+-----------------+  ECA       244            >50%    calcific               shadowing         +----------+-------+--------+--------+-----------------------+-----------------+ +----------+--------+--------+--------+-------------------+           PSV cm/sEDV cm/sDescribeArm Pressure (mmHG) +----------+--------+--------+--------+-------------------+ [id number]     63      Stenotic                    +----------+--------+--------+--------+-------------------+ +---------+--------+--+--------+--+---------+ VertebralPSV cm/s76EDV cm/s23Antegrade +---------+--------+--+--------+--+---------+   Summary: Right Carotid: Velocities in the right ICA are consistent with a 40-59%                stenosis. Left Carotid: Velocities in the left ICA are consistent with a 40-59% stenosis.               The ECA appears >50% stenosed. Vertebrals:  Bilateral vertebral arteries demonstrate antegrade  flow. Subclavians: Left subclavian artery was stenotic. Normal flow hemodynamics were              seen in the right subclavian artery. *See table(s) above for measurements and observations.  Electronically signed by [name] MD on 05/06/2022 at 5:56:55 PM.    Final     sodium chloride   Intravenous Once   aspirin EC  81 mg Oral QPM   atorvastatin  40 mg Oral Daily   carvedilol  12.5 mg Oral BID WC   Chlorhexidine Gluconate Cloth  6 each Topical Daily   clopidogrel  75 mg Oral Daily   darbepoetin (ARANESP) injection - NON-DIALYSIS  60 mcg Subcutaneous Q Thu-1800   hydrALAZINE  100 mg Oral TID   hydrOXYzine  10 mg Oral TID   isosorbide mononitrate  30 mg Oral Daily   loratadine  10 mg Oral Daily   pantoprazole (PROTONIX) IV  40 mg Intravenous Q24H   sevelamer carbonate  800 mg Oral TID WC   sodium bicarbonate  650 mg Oral BID    BMET    Component Value Date/Time   NA 138 05/08/2022 0548   NA 139 04/07/2021 1127   K 3.7 05/08/2022 0548   CL 103 05/08/2022 0548   CO2 24 05/08/2022 0548   GLUCOSE 74 05/08/2022 0548   BUN 31 (H) 05/08/2022 0548   BUN 51 (H) 04/07/2021 1127   CREATININE 6.63 (H) 05/08/2022 0548   CALCIUM 8.5 (L) 05/08/2022 0548   GFRNONAA 9 (L) 05/08/2022 0548   GFRAA 9 (L) 04/23/2020 0955   CBC    Component Value Date/Time   WBC 4.5 05/06/2022 0026   RBC 2.82 (L) 05/06/2022 0026   HGB 8.7 (L) 05/06/2022 0026   HGB 9.5 (L) 04/07/2021 1127   HCT 27.4 (L) 05/06/2022 0026   HCT 28.9 (L) 04/07/2021 1127   PLT 178 05/06/2022 0026   PLT 200 04/07/2021 1127   MCV 97.2 05/06/2022 0026   MCV 93 04/07/2021 1127   MCH 30.9 05/06/2022 0026   MCHC 31.8 05/06/2022 0026   RDW 16.1 (H) 05/06/2022 0026   RDW 14.5 04/07/2021 1127   LYMPHSABS 0.8 04/07/2021 1127   MONOABS 0.4 07/22/2017 0119   EOSABS 0.2 04/07/2021 1127   BASOSABS 0.1 04/07/2021 1127    Assessment/Plan: Acute CVA's - small acute infarcts in right posterior pons and subcortical left cerebral  hemisphere.  Underlying severe chronic small vessel ischemia.  Neuro following.  ESRD - presenting  with some mild uremic symptoms and anemia.  He has changed his mind and is amenable for a short trial of HD to see if he tolerates it.  He has not committed to long term dialysis at this time but is leaning towards it for his nephew who is 84 years old and wants to see him grow up.  He had his first HD session early this morning and tolerated it well, however he will require [facility] prior to discharge and will need outpatient HD arrangements.  Plan for second short treatment on Tuesday 05/10/22.   Avoid nephrotoxic medications including NSAIDs and iodinated intravenous contrast exposure unless the latter is absolutely indicated.   Preferred narcotic agents for pain control are hydromorphone, fentanyl, and methadone. Morphine should not be used.  Avoid Baclofen and avoid oral sodium phosphate and magnesium citrate based laxatives / bowel preps.  Continue strict Input and Output monitoring.  Will monitor the patient closely with you and intervene or adjust therapy as indicated by changes in clinical status/labs  Vascular access - despite having the LUE BVT 2 years ago, it is not yet mature and will require second stage surgery.  Appreciate Dr. Scot Dock and Dr. Judson Roch assistance.  Had temp cath placed 05/07/22 by Dr. Ander Slade.  Dr. Scot Dock will arrange for second stage or new access next week or as an outpatient.  Have consulted IR for [facility] placement but unable to perform this and will need to place on on Tuesday. Chest pain - likely due to demand ischemia from profound anemia, however he has many risk factors for CAD.  Recommend cardiology evaluation, however this will be limited if he refuses dialysis.  Workup on hold given acute CVA. Hyperkalemia - due to CKD.  Start Lokelma 10 grams daily and start renal diet.  Improved. Anemia of CKD stage V - agree with blood transfusion and will check iron stores and start  ESA Chronic combined systolic and diastolic CHF - BNP 8786.  Metabolic acidosis - will start oral sodium bicarb HTN - poorly controlled.  Currently only taking clonidine 0.1 mg tid but had been on carvedilol, hydralazine, and imdur in the past.  Allowing passive HTN per neuro due to recent ischemic strokes. 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  Phos elevated will start binders and follow. Itching - likely due to uremia.  Started atarax and follow. Tobacco abuse - recommend cessation. 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  Chronic back pain - continue with percocet per primary. Disposition - will need outpatient HD arrangements to be made before discharge.   Donetta Potts, MD [facility]

## 2022-05-08 NOTE — Progress Notes (Signed)
PROGRESS NOTE    Cody Webster  JEH:[id number] DOB: 1966-03-15 DOA: 05/04/2022 PCP: Kerin Perna, NP   Brief Narrative:  Cody Webster is a 56 y.o. male with medical history significant of CKD5, HTN, GERD, latent TB. Presented with chest pain. He reports that he was on medication for latent TB, but he stopped because he thought it was making him sick. No other complaint.  Admitted to hospital service at [facility].  Cardiology and nephrology consulted.  Nephrology recommended hemodialysis, he has functional LUE AVF but patient refused dialysis in the beginning but eventually agreed and he was transferred to [facility] for that reason.  Cardiology also followed him.  They are recommending cardiac cath at some point in time during this hospitalization.  On the evening of 05/05/2022, patient was noted to have dysarthria and facial droop and code stroke was called.  MRI shows acute infarct in right posterior pons and subcortical left cerebral hemisphere.  Neurology consulted.  Assessment & Plan:   Principal Problem:   Chest pain Active Problems:   Essential hypertension   CKD (chronic kidney disease) stage 5, GFR less than 15 ml/min (HCC)   Symptomatic anemia   TB lung, latent   Metabolic acidosis   CKD (chronic kidney disease), stage V (HCC)   Acute ischemic stroke (HCC)   Acute congestive heart failure (HCC)  Chest pain: Troponin elevated but flat.  Echo shows slightly reduced LVEF, previously it was 50 to 55% and now it is 45 to 50%.  Also has regional wall motion from release with hypokinesis.  Grade 2 diastolic dysfunction.  Cardiology on board and initially plans were to do cardiac catheterization during hospitalization however now cardiology has decided to defer this to outpatient.  They recommend treating medically for now.  CKD stage V/hyperphosphatemia/hyperkalemia/metabolic acidosis/uremia: Nephrology on board, patient has nonfunctioning LUE AVF.  He needs another  surgery for that, vascular surgery has been consulted, they plan to do second stage either here next week or as outpatient, plans are unclear.  Patient underwent temporary right IJ HD catheter placed by PCCM on 05/07/2022 and he was started on hemodialysis which he finished this morning.  Nephrology on board), next treatment will be on Tuesday but prior to discharge, he is going to need tunneled catheter and arrangement for outpatient hemodialysis.  Patient is very eager to go home, he said he wants to celebrate Christmas with the family and be with the family, even if he has to drive without dialysis.  I have sent a message to palliative care again today and is they were in talks with him about plan of care/goals of care.  Acute ischemic stroke/essential hypertension: MRI report shows acute ischemic stroke.  He is only on aspirin, Plavix is on hold due to need of possible vascular surgery this week.  He is on Lipitor as well.  Neurology recommends 30-day CardioNet monitor as outpatient to rule out A-fib.  It has been 48 hours since he had a stroke so I started him on hydralazine 100 mg 3 times daily this morning but it appears that cardiology had discussed with neurology and they have cleared him to be started on more medication so he was started on Imdur and Coreg as well by cardiology.    Anemia of chronic disease: Patient's hemoglobin dropped to 7 on 05/04/2022 and he received 1 unit of PRBC transfusion.  Hemoglobin improved.  Latent TB: Patient stopped taking medications and he is refusing again.  He has the  capacity to make decisions for himself.  DVT prophylaxis: SCDs Start: 05/04/22 2227   Code Status: DNR  Family Communication:  None present at bedside.  Plan of care discussed with patient in length and he/she verbalized understanding and agreed with it.  Status is: Inpatient Remains inpatient appropriate because: Needs dialysis and further management for stroke.   Estimated body mass index  is 21.84 kg/m as calculated from the following:   Height as of this encounter: '6\' 1"'$  (1.854 m).   Weight as of this encounter: 75.1 kg.    Nutritional Assessment: Body mass index is 21.84 kg/m.Marland Kitchen Seen by dietician.  I agree with the assessment and plan as outlined below: Nutrition Status:        . Skin Assessment: I have examined the patient's skin and I agree with the wound assessment as performed by the wound care RN as outlined below:    Consultants:  Nephrology Cardiology Neurology   Procedures:  As above  Antimicrobials:  Anti-infectives (From admission, onward)    None         Subjective:  Patient seen and examined.  He had no complaint.  Once again he expressed his desire to go home.  Objective: Vitals:   05/08/22 0432 05/08/22 0546 05/08/22 0749 05/08/22 1123  BP: (!) 164/71 (!) 185/79 (!) 164/90 (!) 164/82  Pulse: 79 85 89   Resp: (!) 25 20 (!) 22 (!) 22  Temp: 97.7 F (36.5 C) 98 F (36.7 C) 98 F (36.7 C) 98.2 F (36.8 C)  TempSrc: Oral Oral Oral Oral  SpO2: 98%  98%   Weight:  75.1 kg    Height:        Intake/Output Summary (Last 24 hours) at 05/08/2022 1155 Last data filed at 05/08/2022 0600 Gross per 24 hour  Intake --  Output 800 ml  Net -800 ml    Filed Weights   05/05/22 2300 05/08/22 0130 05/08/22 0546  Weight: 77.5 kg 76.6 kg 75.1 kg    Examination:  General exam: Appears calm and comfortable  Respiratory system: Clear to auscultation. Respiratory effort normal. Cardiovascular system: S1 & S2 heard, RRR. No JVD, murmurs, rubs, gallops or clicks. No pedal edema. Gastrointestinal system: Abdomen is nondistended, soft and nontender. No organomegaly or masses felt. Normal bowel sounds heard. Central nervous system: Alert and oriented. No focal neurological deficits. Extremities: Symmetric 5 x 5 power. Skin: No rashes, lesions or ulcers.  Psychiatry: Judgement and insight appear poor.  Data Reviewed: I have personally  reviewed following labs and imaging studies  CBC: Recent Labs  Lab 05/04/22 1319 05/05/22 0025 05/06/22 0026  WBC 4.2 4.2 4.5  HGB 7.0* 7.3* 8.7*  HCT 22.8* 23.2* 27.4*  MCV 101.8* 100.4* 97.2  PLT 187 191 914    Basic Metabolic Panel: Recent Labs  Lab 05/04/22 2251 05/05/22 0025 05/06/22 0026 05/07/22 0029 05/08/22 0548  NA 135 137 138 139 138  K 5.0 4.8 4.8 4.8 3.7  CL 108 109 110 111 103  CO2 18* 19* 18* 17* 24  GLUCOSE 106* 84 88 96 74  BUN 56* 58* 59* 57* 31*  CREATININE 9.59* 9.72* 10.19* 10.45* 6.63*  CALCIUM 7.9* 7.9* 8.1* 8.5* 8.5*  PHOS 5.4* 5.5* 5.7* 5.6* 3.0    GFR: Estimated Creatinine Clearance: 12.9 mL/min (A) (by C-G formula based on SCr of 6.63 mg/dL (H)). Liver Function Tests: Recent Labs  Lab 05/04/22 2251 05/05/22 0025 05/06/22 0026 05/07/22 0029 05/08/22 0548  ALBUMIN 3.2* 3.0* 3.0* 3.4*  3.0*    No results for input(s): "LIPASE", "AMYLASE" in the last 168 hours. No results for input(s): "AMMONIA" in the last 168 hours. Coagulation Profile: No results for input(s): "INR", "PROTIME" in the last 168 hours. Cardiac Enzymes: No results for input(s): "CKTOTAL", "CKMB", "CKMBINDEX", "TROPONINI" in the last 168 hours. BNP (last 3 results) No results for input(s): "PROBNP" in the last 8760 hours. HbA1C: No results for input(s): "HGBA1C" in the last 72 hours. CBG: Recent Labs  Lab 05/05/22 2138  GLUCAP 105*    Lipid Profile: No results for input(s): "CHOL", "HDL", "LDLCALC", "TRIG", "CHOLHDL", "LDLDIRECT" in the last 72 hours.  Thyroid Function Tests: No results for input(s): "TSH", "T4TOTAL", "FREET4", "T3FREE", "THYROIDAB" in the last 72 hours. Anemia Panel: No results for input(s): "VITAMINB12", "FOLATE", "FERRITIN", "TIBC", "IRON", "RETICCTPCT" in the last 72 hours.  Sepsis Labs: No results for input(s): "PROCALCITON", "LATICACIDVEN" in the last 168 hours.  Recent Results (from the past 240 hour(s))  Resp panel by RT-PCR (RSV,  Flu A&B, Covid) Anterior Nasal Swab     Status: None   Collection Time: 05/04/22  1:02 PM   Specimen: Anterior Nasal Swab  Result Value Ref Range Status   SARS Coronavirus 2 by RT PCR NEGATIVE NEGATIVE Final    Comment: (NOTE) SARS-CoV-2 target nucleic acids are NOT DETECTED.  The SARS-CoV-2 RNA is generally detectable in upper respiratory specimens during the acute phase of infection. The lowest concentration of SARS-CoV-2 viral copies this assay can detect is 138 copies/mL. A negative result does not preclude SARS-Cov-2 infection and should not be used as the sole basis for treatment or other patient management decisions. A negative result may occur with  improper specimen collection/handling, submission of specimen other than nasopharyngeal swab, presence of viral mutation(s) within the areas targeted by this assay, and inadequate number of viral copies(<138 copies/mL). A negative result must be combined with clinical observations, patient history, and epidemiological information. The expected result is Negative.  Fact Sheet for Patients:  EntrepreneurPulse.com.au  Fact Sheet for Healthcare Providers:  IncredibleEmployment.be  This test is no t yet approved or cleared by the [place] FDA and  has been authorized for detection and/or diagnosis of SARS-CoV-2 by FDA under an Emergency Use Authorization (EUA). This EUA will remain  in effect (meaning this test can be used) for the duration of the COVID-19 declaration under Section 564(b)(1) of the Act, 21 U.S.C.section 360bbb-3(b)(1), unless the authorization is terminated  or revoked sooner.       Influenza A by PCR NEGATIVE NEGATIVE Final   Influenza B by PCR NEGATIVE NEGATIVE Final    Comment: (NOTE) The Xpert Xpress SARS-CoV-2/FLU/RSV plus assay is intended as an aid in the diagnosis of influenza from Nasopharyngeal swab specimens and should not be used as a sole basis for  treatment. Nasal washings and aspirates are unacceptable for Xpert Xpress SARS-CoV-2/FLU/RSV testing.  Fact Sheet for Patients: EntrepreneurPulse.com.au  Fact Sheet for Healthcare Providers: IncredibleEmployment.be  This test is not yet approved or cleared by the [place] FDA and has been authorized for detection and/or diagnosis of SARS-CoV-2 by FDA under an Emergency Use Authorization (EUA). This EUA will remain in effect (meaning this test can be used) for the duration of the COVID-19 declaration under Section 564(b)(1) of the Act, 21 U.S.C. section 360bbb-3(b)(1), unless the authorization is terminated or revoked.     Resp Syncytial Virus by PCR NEGATIVE NEGATIVE Final    Comment: (NOTE) Fact Sheet for Patients: EntrepreneurPulse.com.au  Fact Sheet for  Healthcare Providers: IncredibleEmployment.be  This test is not yet approved or cleared by the [place] and has been authorized for detection and/or diagnosis of SARS-CoV-2 by FDA under an Emergency Use Authorization (EUA). This EUA will remain in effect (meaning this test can be used) for the duration of the COVID-19 declaration under Section 564(b)(1) of the Act, 21 U.S.C. section 360bbb-3(b)(1), unless the authorization is terminated or revoked.  Performed at [facility], [place] [address], [place], [place] [address]   MRSA Next Gen by PCR, Nasal     Status: None   Collection Time: 05/05/22 11:41 PM   Specimen: Nasal Mucosa; Nasal Swab  Result Value Ref Range Status   MRSA by PCR Next Gen NOT DETECTED NOT DETECTED Final    Comment: (NOTE) The GeneXpert MRSA Assay (FDA approved for NASAL specimens only), is one component of a comprehensive MRSA colonization surveillance program. It is not intended to diagnose MRSA infection nor to guide or monitor treatment for MRSA infections. Test performance is not FDA approved  in patients less than 49 years old. Performed at [facility], [place] [address], [place], [place] [address]      Radiology Studies: VAS US DUPLEX DIALYSIS ACCESS (AVF, AVG)  Result Date: 05/08/2022 DIALYSIS ACCESS Patient Name:  Cody Webster  Date of Exam:   05/08/2022 Medical Rec #: [id number]      Accession #:    [id number] Date of Birth: 17-Dec-1965      Patient Gender: M Patient Age:   56 years Exam Location:  [facility] Procedure:      VAS US DUPLEX DIALYSIS ACCESS (AVF, AVG) Referring Phys: Harrell Gave DICKSON --------------------------------------------------------------------------------  Reason for Exam: Evaluation prior to placement of dialysis access. Access Site: Left Upper Extremity. Access Type: Basilic vein transposition. History: 15/05/7614 - LEFT BASILIC VEIN FISTULA CREATION. Comparison Study: No prior studies. Performing Technologist: Oliver Hum RVT  Examination Guidelines: A complete evaluation includes B-mode imaging, spectral Doppler, color Doppler, and power Doppler as needed of all accessible portions of each vessel. Unilateral testing is considered an integral part of a complete examination. Limited examinations for reoccurring indications may be performed as noted.  Findings:  +------------+----------+-------------+----------+--------+ OUTFLOW VEINPSV (cm/s)Diameter (cm)Depth (cm)Describe +------------+----------+-------------+----------+--------+ Prox UA                   0.48                        +------------+----------+-------------+----------+--------+ Mid UA                    0.45                        +------------+----------+-------------+----------+--------+ Dist UA                   0.68                Branch  +------------+----------+-------------+----------+--------+ An area of stenosis is noted in the distal segment of the basilic vein near the anastimosis. Pre 0.76 cm, at 0.37 cm, post 0.72 cm.      --------------------------------------------------------------------------------   Preliminary    DG CHEST PORT 1 VIEW  Result Date: 05/07/2022 CLINICAL DATA:  Status post insertion of hemodialysis catheter. EXAM: PORTABLE CHEST 1 VIEW COMPARISON:  Chest radiograph 05/04/2022 and earlier FINDINGS: Interval placement of right IJ central venous catheter with the distal tip of the level of the  mid SVC. Stable enlarged cardiomediastinal silhouette which could be exaggerated secondary to portable technique and patient positioning. Aortic calcifications. No focal consolidation, pleural effusion, or pneumothorax. IMPRESSION: Interval placement of right IJ central venous catheter with the distal tip of the level of the mid SVC. Electronically Signed   By: Ileana Roup M.D.   On: 05/07/2022 14:45   VAS [place] TRANSCRANIAL DOPPLER  Result Date: 05/07/2022  Transcranial Doppler Patient Name:  Cody Webster  Date of Exam:   05/06/2022 Medical Rec #: [id number]      Accession #:    [id number] Date of Birth: 01-28-1966      Patient Gender: M Patient Age:   41 years Exam Location:  [facility] Procedure:      VAS [place] TRANSCRANIAL DOPPLER Referring Phys: Elwin Sleight DE LA TORRE --------------------------------------------------------------------------------  Indications: Stroke. History: HTN, HLD, SMK, DM, CHF, CKD, CVA. 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 Comparison Study: No previous exams Performing Technologist: Jody Hill RVT, RDMS  Examination Guidelines: A complete evaluation includes B-mode imaging, spectral Doppler, color Doppler, and power Doppler as needed of all accessible portions of each vessel. Bilateral testing is considered an integral part of a complete examination. Limited examinations for reoccurring indications may be performed as noted.   +----------+---------------+----------+-----------+-------------+ RIGHT TCD Right VM (cm/s)Depth (cm)Pulsatility   Comment    +----------+---------------+----------+-----------+-------------+ MCA                                           not insonated +----------+---------------+----------+-----------+-------------+ ACA                                           not insonated +----------+---------------+----------+-----------+-------------+ Term ICA                                      not insonated +----------+---------------+----------+-----------+-------------+ PCA P1                                        not insonated +----------+---------------+----------+-----------+-------------+ Opthalmic                                     not insonated +----------+---------------+----------+-----------+-------------+ ICA siphon                                    not insonated +----------+---------------+----------+-----------+-------------+ Vertebral       -70                   1.11                  +----------+---------------+----------+-----------+-------------+  +----------+--------------+----------+-----------+-------------+ LEFT TCD  Left VM (cm/s)Depth (cm)Pulsatility   Comment    +----------+--------------+----------+-----------+-------------+ MCA             29  0.98                  +----------+--------------+----------+-----------+-------------+ ACA            -24                   0.88                  +----------+--------------+----------+-----------+-------------+ Term ICA                                     not insonated +----------+--------------+----------+-----------+-------------+ PCA P1                                       not insonated +----------+--------------+----------+-----------+-------------+ Opthalmic       14                   0.88                   +----------+--------------+----------+-----------+-------------+ ICA siphon      21                   2.00                  +----------+--------------+----------+-----------+-------------+ Vertebral      -31                   1.06                  +----------+--------------+----------+-----------+-------------+ Distal ICA                                   not insonated +----------+--------------+----------+-----------+-------------+  +------------+-------+-------+             VM cm/sComment +------------+-------+-------+ Prox Basilar  -77   1.12   +------------+-------+-------+ Dist Basilar  -75   1.15   +------------+-------+-------+ Summary:  Absent right temporal and poor biorbital and left temporalwindows limit evaluation of anterior circulation vessels.Mildly elevated mean flow velocities in bilateral vertebral and basilar arteries of unc;lear significance. *See table(s) above for TCD measurements and observations.  Diagnosing physician: Antony Contras MD Electronically signed by Antony Contras MD on 05/07/2022 at 9:14:15 AM.    Final    VAS US CAROTID  Result Date: 05/06/2022 Carotid Arterial Duplex Study Patient Name:  Cody Webster  Date of Exam:   05/06/2022 Medical Rec #: [id number]      Accession #:    [id number] Date of Birth: 1966-04-02      Patient Gender: M Patient Age:   49 years Exam Location:  [facility] Procedure:      VAS US CAROTID Referring Phys: Elwin Sleight DE LA TORRE --------------------------------------------------------------------------------  Indications:   CVA. Risk Factors:  Hypertension, hyperlipidemia, Diabetes, current smoker, prior                CVA. Other Factors: CHF, CKD. Limitations    Today's exam was limited due to heavy calcification and the                resulting shadowing and patient movement. Performing Technologist: Rogelia Rohrer RVT, RDMS  Examination Guidelines: A complete evaluation includes B-mode imaging, spectral Doppler, color  Doppler, and power Doppler as needed of all accessible  portions of each vessel. Bilateral testing is considered an integral part of a complete examination. Limited examinations for reoccurring indications may be performed as noted.  Right Carotid Findings: +----------+--------+--------+--------+------------------+---------+           PSV cm/sEDV cm/sStenosisPlaque DescriptionComments  +----------+--------+--------+--------+------------------+---------+ CCA Prox  120     18              calcific          Shadowing +----------+--------+--------+--------+------------------+---------+ CCA Distal148     25              calcific          Shadowing +----------+--------+--------+--------+------------------+---------+ ICA Prox  149     35      40-59%  calcific          Shadowing +----------+--------+--------+--------+------------------+---------+ ICA Mid   122     28              calcific          Shadowing +----------+--------+--------+--------+------------------+---------+ ICA Distal85      19                                          +----------+--------+--------+--------+------------------+---------+ ECA       171                     calcific          shadowing +----------+--------+--------+--------+------------------+---------+ +----------+--------+-------+----------------+-------------------+           PSV cm/sEDV cmsDescribe        Arm Pressure (mmHG) +----------+--------+-------+----------------+-------------------+ [id number]            Multiphasic, WNL                    +----------+--------+-------+----------------+-------------------+ +---------+--------+--+--------+--+---------+ VertebralPSV cm/s75EDV cm/s23Antegrade +---------+--------+--+--------+--+---------+  Left Carotid Findings: +----------+-------+--------+--------+-----------------------+-----------------+           PSV    EDV cm/sStenosisPlaque Description     Comments                     cm/s                                                            +----------+-------+--------+--------+-----------------------+-----------------+ CCA Prox  106    22                                     intimal                                                                   thickening        +----------+-------+--------+--------+-----------------------+-----------------+ CCA Distal113    21              heterogenous and  calcific                                 +----------+-------+--------+--------+-----------------------+-----------------+ ICA Prox  138    39      40-59%  calcific               Shadowing         +----------+-------+--------+--------+-----------------------+-----------------+ ICA Mid   100    24              calcific               Shadowing         +----------+-------+--------+--------+-----------------------+-----------------+ ICA Distal90     23                                                       +----------+-------+--------+--------+-----------------------+-----------------+ ECA       244            >50%    calcific               shadowing         +----------+-------+--------+--------+-----------------------+-----------------+ +----------+--------+--------+--------+-------------------+           PSV cm/sEDV cm/sDescribeArm Pressure (mmHG) +----------+--------+--------+--------+-------------------+ [id number]     63      Stenotic                    +----------+--------+--------+--------+-------------------+ +---------+--------+--+--------+--+---------+ VertebralPSV cm/s76EDV cm/s23Antegrade +---------+--------+--+--------+--+---------+   Summary: Right Carotid: Velocities in the right ICA are consistent with a 40-59%                stenosis. Left Carotid: Velocities in the left ICA are consistent with a 40-59% stenosis.               The ECA  appears >50% stenosed. Vertebrals:  Bilateral vertebral arteries demonstrate antegrade flow. Subclavians: Left subclavian artery was stenotic. Normal flow hemodynamics were              seen in the right subclavian artery. *See table(s) above for measurements and observations.  Electronically signed by [name] MD on 05/06/2022 at 5:56:55 PM.    Final     Scheduled Meds:  sodium chloride   Intravenous Once   aspirin EC  81 mg Oral QPM   atorvastatin  40 mg Oral Daily   carvedilol  12.5 mg Oral BID WC   Chlorhexidine Gluconate Cloth  6 each Topical Daily   clopidogrel  75 mg Oral Daily   darbepoetin (ARANESP) injection - NON-DIALYSIS  60 mcg Subcutaneous Q Thu-1800   hydrALAZINE  100 mg Oral TID   hydrOXYzine  10 mg Oral TID   isosorbide mononitrate  30 mg Oral Daily   loratadine  10 mg Oral Daily   LORazepam  0.5 mg Intravenous Once   pantoprazole  40 mg Oral QHS   sevelamer carbonate  800 mg Oral TID WC   sodium bicarbonate  650 mg Oral BID   Continuous Infusions:     LOS: 3 days   Darliss Cheney, MD [facility]  05/08/2022, 11:55 AM   *Please note that this is a verbal dictation therefore any spelling or grammatical errors are due to the "[place] [employer]" system interpretation.  Please page via [place] and  do not message via secure chat for urgent patient care matters. Secure chat can be used for non urgent patient care matters.  How to contact the [facility] Attending or Consulting provider [place] or covering provider during after hours [place], for this patient?  Check the care team in [facility] and look for a) attending/consulting TR[facility] provider listed and b) the [facility] team listed. Page or secure chat [phone]. Log into [email or web address] and use [place]'s universal password to access. If you do not have the password, please contact the hospital operator. Locate the [facility] provider you are looking for under [facility] and page to a number that you can be directly  reached. If you still have difficulty reaching the provider, please page the [facility] (Director on Call) for the Hospitalists listed on amion for assistance.

## 2022-05-08 NOTE — Progress Notes (Signed)
Received patient in bed to unit.  Alert and oriented.  Informed consent signed and in chart.   Treatment initiated: 0153 Treatment completed: 0425  Patient tolerated well.  Transported back to the room  Alert, without acute distress.  Hand-off given to patient's nurse.   Access used: catheter Access issues: none  Total UF removed: 500 ml Medication(s) given: none Post HD VS: 164/71 79 25 18 Post HD weight: 76.1 kg   [facility]

## 2022-05-08 NOTE — Progress Notes (Signed)
Initial Nutrition Assessment RD working remotely.   DOCUMENTATION CODES:   Not applicable  INTERVENTION:  - ordered Nepro Shake po BID, each supplement provides 425 kcal and 19 grams protein.  - ordered 30 ml Prosource Plus BID, each supplement provides 100 kcal and 15 grams protein.   - ordered 1 tablet rena-vite/day.  - complete NFPE when feasible.  - recommend Renal diet education prior to discharge.   NUTRITION DIAGNOSIS:   Increased nutrient needs related to acute illness, chronic illness as evidenced by estimated needs.  GOAL:   Patient will meet greater than or equal to 90% of their needs  MONITOR:   PO intake, Supplement acceptance, Labs, Weight trends  REASON FOR ASSESSMENT:   Consult Assessment of nutrition requirement/status  ASSESSMENT:   56 y.o. male with medical history of stage 5 CKD, HTN, GERD, DM, seizures, peripheral neuropathy, PTSD, depression, and latent TB. He presented to the ED with chest pain. He reports that he was on medication for latent TB, but he stopped because he thought it was making him sick. Admitted to hospital service at [facility]. Cardiology and Nephrology consulted.  Nephrology recommended hemodialysis, he has functional LUE AVF but patient refused dialysis in the beginning but eventually agreed and was transferred to [facility] for that reason. Cardiology recommending cardiac cath at some point in time during this hospitalization.  On the evening of 05/05/2022, patient was noted to have dysarthria and facial droop; Code Stroke was called. MRI shows acute infarct in right posterior pons and subcortical left cerebral hemisphere.  Neurology consulted.  Unable to reach patient on the phone this afternoon. Patient on a Renal diet with 1.2 L fluid restriction and the only documented meal intake percentage in the flow sheet is 50% of breakfast on 12/22.  Weight today is 165 lb and weight on 12/21 was 171 lb. No information documented in  the edema section of flow sheet during hospitalization. Weight on 03/29/22 was 177 lb and weight on 04/30/21 was 189 lb. This would indicate 12 lb / 6.8% wt loss in 1 month (significant for time frame) and 24 lb / 12.7% wt loss in 1 year (not significant for time frame.  Patient has not been assessed by a [place] RD since 07/2017.  Per notes: - hemodialysis with need for more permanent access; next treatment date is 12/26 - ongoing conversations about GOC/POC - acute ischemic stroke - anemia of chronic disease   Labs reviewed; BUN: 31 mg/dl (trending down), creatinine: 6.63 mg/dl (trending down), Ca: 8.5 mg/dl, GFR: 9 ml/min (trending up).  Medications reviewed; 40 mg oral protonix/day, 800 mg renvela TID, 650 mg sodium bicarb BID.    NUTRITION - FOCUSED PHYSICAL EXAM:  RD working remotely.  Diet Order:   Diet Order             Diet renal with fluid restriction Fluid restriction: 1200 mL Fluid; Room service appropriate? Yes; Fluid consistency: Thin  Diet effective now                   EDUCATION NEEDS:   Not appropriate for education at this time  Skin:  Skin Assessment: Reviewed RN Assessment  Last BM:  PTA/unknown  Height:   Ht Readings from Last 1 Encounters:  05/05/22 '6\' 1"'$  (1.854 m)    Weight:   Wt Readings from Last 1 Encounters:  05/08/22 75.1 kg     BMI:  Body mass index is 21.84 kg/m.  Estimated Nutritional Needs:  Kcal:  2300-2500 kcal Protein:  115-125 grams Fluid:  UOP + 1 L/day      Jarome Matin, MS, RD, LDN, CNSC Clinical Dietitian PRN/Relief staff On-call/weekend pager # available in [facility]

## 2022-05-08 NOTE — Progress Notes (Signed)
Patient left the floor for his MRI. Central tele was notified and patient was given his Ativan 0.'5mg'$  dose. Patient stated he was appreciative of the medication.

## 2022-05-08 NOTE — Progress Notes (Signed)
Palliative Medicine Inpatient Follow Up Note     Chart Reviewed. Patient assessed at the bedside.   Cody Webster is awake and alert. His niece/POA, Cody Webster is at the bedside in addition to his sister-in-law. Cody Webster shares his frustration of remaining in the hospital for Christmas. States his 58 year old autistic nephew is in town for the month and he spends a lot of time with him. He was hopeful to be home with him for the holidays.   I discussed at length Cody Webster's current illness and trajectory. He verbalized understanding. He is emotional sharing he does not wish to focus on end-of-life but how he can live to be around for his nephew and other family members. Emotional support provided. We discussed need for dialysis and a more permanent catheter prior to discharge. He understands the risk of discharging home without sufficient care being arranged such as dialysis. [name] verbalized understanding and confirmed his wishes to continue with any and all aggressive interventions (only limitation is DNR) allowing him every opportunity to thrive for as long as possible.   His family verbalized agreement speaking to their plans to continue supporting Cody Webster making sure his care is sufficient once he is safe to discharge. His sister-in-law has brought Christmas decorations in and made his room more festive.   We discussed the importance of continued conversation with family and their  medical providers regarding overall plan of care and treatment options, ensuring decisions are within the context of the patients values and GOCs.   Questions addressed and support provided.    Objective Assessment: Vital Signs Vitals:   05/08/22 0749 05/08/22 1123  BP: (!) 164/90 (!) 164/82  Pulse: 89   Resp: (!) 22 (!) 22  Temp: 98 F (36.7 C) 98.2 F (36.8 C)  SpO2: 98%     Intake/Output Summary (Last 24 hours) at 05/08/2022 1350 Last data filed at 05/08/2022 0600 Gross per 24 hour  Intake --  Output  800 ml  Net -800 ml   Last Weight  Most recent update: 05/08/2022  5:47 AM    Weight  75.1 kg (165 lb 9.1 oz)            Gen:  NAD, thin HEENT: moist mucous membranes CV: Regular rate and rhythm, no murmurs rubs or gallops PULM: clear to auscultation bilaterally. No wheezes/rales/rhonchi ABD: soft/nontender/nondistended/normal bowel sounds EXT: No edema Neuro: Alert and oriented x3, mood appropriate   SUMMARY OF RECOMMENDATIONS   Continue with current plan of care per medical team  Patient is clear in expressed wishes to continue to treat the treatable aggressively allowing him every opportunity to improve. Open to HD and other necessary treatments as his goal is to be around for his 58 year old autistic nephew.  DNR/DNI  PMT will continue to support and follow on as needed basis. Please secure chat for urgent needs.   Discussed with Dr. Doristine Bosworth via secure chat.   Time Total: 45 min   Visit consisted of counseling and education dealing with the complex and emotionally intense issues of symptom management and palliative care in the setting of serious and potentially life-threatening illness.Greater than 50%  of this time was spent counseling and coordinating care related to the above assessment and plan.  Alda Lea, AGPCNP-BC  [place]  [phone]  Palliative Medicine Team providers are available by phone from 7am to 7pm daily and can be reached through the team cell phone. Should this patient require assistance outside  of these hours, please call the patient's attending physician.

## 2022-05-08 NOTE — Progress Notes (Signed)
Cardiology Progress Note  Patient ID: Cody Webster MRN: [id number] DOB: [birth year] Date of Encounter: 05/08/2022  Primary Cardiologist: Glenetta Hew, MD  Subjective   Chief Complaint: None.   HPI: Right IJ hemodialysis catheter placed yesterday.  He will need a tunneled catheter prior to discharge.  Reports no further chest pain.  Blood pressure improved.  ROS:  All other ROS reviewed and negative. Pertinent positives noted in the HPI.     Inpatient Medications  Scheduled Meds:  sodium chloride   Intravenous Once   aspirin EC  81 mg Oral QPM   atorvastatin  40 mg Oral Daily   carvedilol  12.5 mg Oral BID WC   Chlorhexidine Gluconate Cloth  6 each Topical Daily   clopidogrel  75 mg Oral Daily   darbepoetin (ARANESP) injection - NON-DIALYSIS  60 mcg Subcutaneous Q Thu-1800   hydrALAZINE  100 mg Oral TID   hydrOXYzine  10 mg Oral TID   loratadine  10 mg Oral Daily   pantoprazole (PROTONIX) IV  40 mg Intravenous Q24H   sevelamer carbonate  800 mg Oral TID WC   sodium bicarbonate  650 mg Oral BID   Continuous Infusions:  PRN Meds: acetaminophen, diphenhydrAMINE, fluticasone, hydrALAZINE, HYDROmorphone (DILAUDID) injection, labetalol, nitroGLYCERIN, oxyCODONE-acetaminophen **AND** oxyCODONE, prochlorperazine, traZODone   Vital Signs   Vitals:   05/08/22 0402 05/08/22 0432 05/08/22 0546 05/08/22 0749  BP: (!) 171/72 (!) 164/71 (!) 185/79 (!) 164/90  Pulse: 78 79 85 89  Resp: 20 (!) 25 20 (!) 22  Temp:  97.7 F (36.5 C) 98 F (36.7 C) 98 F (36.7 C)  TempSrc:  Oral Oral Oral  SpO2: 97% 98%  98%  Weight:   75.1 kg   Height:        Intake/Output Summary (Last 24 hours) at 05/08/2022 0851 Last data filed at 05/08/2022 0600 Gross per 24 hour  Intake --  Output 1100 ml  Net -1100 ml      05/08/2022    5:46 AM 05/08/2022    1:30 AM 05/05/2022   11:00 PM  Last 3 Weights  Weight (lbs) 165 lb 9.1 oz 168 lb 14 oz 170 lb 13.7 oz  Weight (kg) 75.1 kg 76.6 kg 77.5 kg       Telemetry  Overnight telemetry shows sinus rhythm in the 80s, which I personally reviewed.   ECG  The most recent ECG shows sinus rhythm heart rate 92, no acute ischemic changes or evidence of prior infarction, which I personally reviewed.   Physical Exam   Vitals:   05/08/22 0402 05/08/22 0432 05/08/22 0546 05/08/22 0749  BP: (!) 171/72 (!) 164/71 (!) 185/79 (!) 164/90  Pulse: 78 79 85 89  Resp: 20 (!) 25 20 (!) 22  Temp:  97.7 F (36.5 C) 98 F (36.7 C) 98 F (36.7 C)  TempSrc:  Oral Oral Oral  SpO2: 97% 98%  98%  Weight:   75.1 kg   Height:        Intake/Output Summary (Last 24 hours) at 05/08/2022 0851 Last data filed at 05/08/2022 0600 Gross per 24 hour  Intake --  Output 1100 ml  Net -1100 ml       05/08/2022    5:46 AM 05/08/2022    1:30 AM 05/05/2022   11:00 PM  Last 3 Weights  Weight (lbs) 165 lb 9.1 oz 168 lb 14 oz 170 lb 13.7 oz  Weight (kg) 75.1 kg 76.6 kg 77.5 kg    Body mass index  is 21.84 kg/m.  General: Well nourished, well developed, in no acute distress Head: Atraumatic, normal size  Eyes: PEERLA, EOMI  Neck: Supple, no JVD Endocrine: No thryomegaly Cardiac: Normal S1, S2; RRR; no murmurs, rubs, or gallops Lungs: Clear to auscultation bilaterally, no wheezing, rhonchi or rales  Abd: Soft, nontender, no hepatomegaly  Ext: No edema, pulses 2+ Musculoskeletal: No deformities, BUE and BLE strength normal and equal Skin: Warm and dry, no rashes   Neuro: Alert and oriented to person, place, time, and situation, CNII-XII grossly intact, no focal deficits  Psych: Normal mood and affect   Labs  High Sensitivity Troponin:   Recent Labs  Lab 05/04/22 1319 05/04/22 1511 05/04/22 2251 05/05/22 0025  TROPONINIHS 221* 220* 190* 246*     Cardiac EnzymesNo results for input(s): "TROPONINI" in the last 168 hours. No results for input(s): "TROPIPOC" in the last 168 hours.  Chemistry Recent Labs  Lab 05/06/22 0026 05/07/22 0029 05/08/22 0548   NA 138 139 138  K 4.8 4.8 3.7  CL 110 111 103  CO2 18* 17* 24  GLUCOSE 88 96 74  BUN 59* 57* 31*  CREATININE 10.19* 10.45* 6.63*  CALCIUM 8.1* 8.5* 8.5*  ALBUMIN 3.0* 3.4* 3.0*  GFRNONAA 5* 5* 9*  ANIONGAP '10 11 11    '$ Hematology Recent Labs  Lab 05/04/22 1319 05/05/22 0025 05/06/22 0026  WBC 4.2 4.2 4.5  RBC 2.24* 2.31* 2.82*  HGB 7.0* 7.3* 8.7*  HCT 22.8* 23.2* 27.4*  MCV 101.8* 100.4* 97.2  MCH 31.3 31.6 30.9  MCHC 30.7 31.5 31.8  RDW 15.3 15.0 16.1*  PLT 187 191 178   BNP Recent Labs  Lab 05/04/22 1308  BNP 2,226.4*    DDimer No results for input(s): "DDIMER" in the last 168 hours.   Radiology  DG CHEST PORT 1 VIEW  Result Date: 05/07/2022 CLINICAL DATA:  Status post insertion of hemodialysis catheter. EXAM: PORTABLE CHEST 1 VIEW COMPARISON:  Chest radiograph 05/04/2022 and earlier FINDINGS: Interval placement of right IJ central venous catheter with the distal tip of the level of the mid SVC. Stable enlarged cardiomediastinal silhouette which could be exaggerated secondary to portable technique and patient positioning. Aortic calcifications. No focal consolidation, pleural effusion, or pneumothorax. IMPRESSION: Interval placement of right IJ central venous catheter with the distal tip of the level of the mid SVC. Electronically Signed   By: Ileana Roup M.D.   On: 05/07/2022 14:45   VAS [place] TRANSCRANIAL DOPPLER  Result Date: 05/07/2022  Transcranial Doppler Patient Name:  Cody Webster  Date of Exam:   05/06/2022 Medical Rec #: [id number]      Accession #:    [id number] Date of Birth: 09-06-1965      Patient Gender: M Patient Age:   13 years Exam Location:  [facility] Procedure:      VAS [place] TRANSCRANIAL DOPPLER Referring Phys: Elwin Sleight DE LA TORRE --------------------------------------------------------------------------------  Indications: Stroke. History: HTN, HLD, SMK, DM, CHF, CKD, CVA. 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 Comparison Study: No previous exams Performing Technologist: Jody Hill RVT, RDMS  Examination Guidelines: A complete evaluation includes B-mode imaging, spectral Doppler, color Doppler, and power Doppler as needed of all accessible portions of each vessel. Bilateral testing is considered an integral part of a complete examination. Limited examinations for reoccurring indications may be performed as noted.  +----------+---------------+----------+-----------+-------------+ RIGHT TCD Right VM (cm/s)Depth (  cm)Pulsatility   Comment    +----------+---------------+----------+-----------+-------------+ MCA                                           not insonated +----------+---------------+----------+-----------+-------------+ ACA                                           not insonated +----------+---------------+----------+-----------+-------------+ Term ICA                                      not insonated +----------+---------------+----------+-----------+-------------+ PCA P1                                        not insonated +----------+---------------+----------+-----------+-------------+ Opthalmic                                     not insonated +----------+---------------+----------+-----------+-------------+ ICA siphon                                    not insonated +----------+---------------+----------+-----------+-------------+ Vertebral       -70                   1.11                  +----------+---------------+----------+-----------+-------------+  +----------+--------------+----------+-----------+-------------+ LEFT TCD  Left VM (cm/s)Depth (cm)Pulsatility   Comment    +----------+--------------+----------+-----------+-------------+ MCA             29                   0.98                   +----------+--------------+----------+-----------+-------------+ ACA            -24                   0.88                  +----------+--------------+----------+-----------+-------------+ Term ICA                                     not insonated +----------+--------------+----------+-----------+-------------+ PCA P1                                       not insonated +----------+--------------+----------+-----------+-------------+ Opthalmic       14                   0.88                  +----------+--------------+----------+-----------+-------------+ ICA siphon      21                   2.00                  +----------+--------------+----------+-----------+-------------+  Vertebral      -31                   1.06                  +----------+--------------+----------+-----------+-------------+ Distal ICA                                   not insonated +----------+--------------+----------+-----------+-------------+  +------------+-------+-------+             VM cm/sComment +------------+-------+-------+ Prox Basilar  -77   1.12   +------------+-------+-------+ Dist Basilar  -75   1.15   +------------+-------+-------+ Summary:  Absent right temporal and poor biorbital and left temporalwindows limit evaluation of anterior circulation vessels.Mildly elevated mean flow velocities in bilateral vertebral and basilar arteries of unc;lear significance. *See table(s) above for TCD measurements and observations.  Diagnosing physician: Antony Contras MD Electronically signed by Antony Contras MD on 05/07/2022 at 9:14:15 AM.    Final    VAS US CAROTID  Result Date: 05/06/2022 Carotid Arterial Duplex Study Patient Name:  RAMEEN QUINNEY  Date of Exam:   05/06/2022 Medical Rec #: [id number]      Accession #:    [id number] Date of Birth: [birth year]      Patient Gender: M Patient Age:   76 years Exam Location:  [facility] Procedure:      VAS US CAROTID Referring Phys:  Elwin Sleight DE LA TORRE --------------------------------------------------------------------------------  Indications:   CVA. Risk Factors:  Hypertension, hyperlipidemia, Diabetes, current smoker, prior                CVA. Other Factors: CHF, CKD. Limitations    Today's exam was limited due to heavy calcification and the                resulting shadowing and patient movement. Performing Technologist: Rogelia Rohrer RVT, RDMS  Examination Guidelines: A complete evaluation includes B-mode imaging, spectral Doppler, color Doppler, and power Doppler as needed of all accessible portions of each vessel. Bilateral testing is considered an integral part of a complete examination. Limited examinations for reoccurring indications may be performed as noted.  Right Carotid Findings: +----------+--------+--------+--------+------------------+---------+           PSV cm/sEDV cm/sStenosisPlaque DescriptionComments  +----------+--------+--------+--------+------------------+---------+ CCA Prox  120     18              calcific          Shadowing +----------+--------+--------+--------+------------------+---------+ CCA Distal148     25              calcific          Shadowing +----------+--------+--------+--------+------------------+---------+ ICA Prox  149     35      40-59%  calcific          Shadowing +----------+--------+--------+--------+------------------+---------+ ICA Mid   122     28              calcific          Shadowing +----------+--------+--------+--------+------------------+---------+ ICA Distal85      19                                          +----------+--------+--------+--------+------------------+---------+ ECA       171  calcific          shadowing +----------+--------+--------+--------+------------------+---------+ +----------+--------+-------+----------------+-------------------+           PSV cm/sEDV cmsDescribe        Arm Pressure (mmHG)  +----------+--------+-------+----------------+-------------------+ [id number]            Multiphasic, WNL                    +----------+--------+-------+----------------+-------------------+ +---------+--------+--+--------+--+---------+ VertebralPSV cm/s75EDV cm/s23Antegrade +---------+--------+--+--------+--+---------+  Left Carotid Findings: +----------+-------+--------+--------+-----------------------+-----------------+           PSV    EDV cm/sStenosisPlaque Description     Comments                    cm/s                                                            +----------+-------+--------+--------+-----------------------+-----------------+ CCA Prox  106    22                                     intimal                                                                   thickening        +----------+-------+--------+--------+-----------------------+-----------------+ CCA Distal113    21              heterogenous and                                                          calcific                                 +----------+-------+--------+--------+-----------------------+-----------------+ ICA Prox  138    39      40-59%  calcific               Shadowing         +----------+-------+--------+--------+-----------------------+-----------------+ ICA Mid   100    24              calcific               Shadowing         +----------+-------+--------+--------+-----------------------+-----------------+ ICA Distal90     23                                                       +----------+-------+--------+--------+-----------------------+-----------------+ ECA       244            >50%    calcific  shadowing         +----------+-------+--------+--------+-----------------------+-----------------+ +----------+--------+--------+--------+-------------------+           PSV cm/sEDV cm/sDescribeArm Pressure (mmHG)  +----------+--------+--------+--------+-------------------+ [id number]     63      Stenotic                    +----------+--------+--------+--------+-------------------+ +---------+--------+--+--------+--+---------+ VertebralPSV cm/s76EDV cm/s23Antegrade +---------+--------+--+--------+--+---------+   Summary: Right Carotid: Velocities in the right ICA are consistent with a 40-59%                stenosis. Left Carotid: Velocities in the left ICA are consistent with a 40-59% stenosis.               The ECA appears >50% stenosed. Vertebrals:  Bilateral vertebral arteries demonstrate antegrade flow. Subclavians: Left subclavian artery was stenotic. Normal flow hemodynamics were              seen in the right subclavian artery. *See table(s) above for measurements and observations.  Electronically signed by [name] MD on 05/06/2022 at 5:56:55 PM.    Final     Cardiac Studies  TTE 05/05/2022  1. Left ventricular ejection fraction, by estimation, is 45 to 50%. The  left ventricle has mildly decreased function. The left ventricle  demonstrates regional wall motion abnormalities with basal to mid  anterolateral and basal to mid inferior  hypokinesis. There is mild concentric left ventricular hypertrophy. Left  ventricular diastolic parameters are consistent with Grade II diastolic  dysfunction (pseudonormalization).   2. Right ventricular systolic function is normal. The right ventricular  size is mildly enlarged. There is moderately elevated pulmonary artery  systolic pressure. The estimated right ventricular systolic pressure is  74.2 mmHg.   3. Left atrial size was severely dilated.   4. Right atrial size was mildly dilated.   5. The mitral valve is degenerative. Mild to moderate mitral valve  regurgitation. No evidence of mitral stenosis. Moderate mitral annular  calcification.   6. The aortic valve is tricuspid. There is mild calcification of the  aortic valve. Aortic  valve regurgitation is not visualized. No aortic  stenosis is present.   7. The inferior vena cava is dilated in size with <50% respiratory  variability, suggesting right atrial pressure of 15 mmHg.   Carotid US 05/06/2022 Summary:  Right Carotid: Velocities in the right ICA are consistent with a 40-59%                 stenosis.   Left Carotid: Velocities in the left ICA are consistent with a 40-59%  stenosis.               The ECA appears >50% stenosed.   Vertebrals:  Bilateral vertebral arteries demonstrate antegrade flow.  Subclavians: Left subclavian artery was stenotic. Normal flow hemodynamics  were              seen in the right subclavian artery.   Patient Profile  Cody Webster is a 55 y.o. male with diastolic heart failure, CKD stage V, hypertension, diabetes, latent tuberculosis who was admitted on 05/05/2022 with chest pain and acute systolic heart failure.  Course has been complicated by acute stroke and ongoing chest pain.   Assessment & Plan   #Chest pain, concerning for unstable angina #Elevated troponin #CAD, prior evidence of ischemia on stress testing -Admitted with chest pain symptoms and CKD stage V.  Also found to have small vessel lacunar stroke this admission. -He continues to have chest  pain symptoms however after starting hemodialysis his chest pain has improved.  I wonder if this was related to his uremia. -His troponins were minimally elevated and flat.  He has been permissively hypertensive in the setting of lacunar stroke. -EKG without ischemic changes.  Echo does show reduced EF with wall motion abnormality however. -Known history of ischemia on prior stress testing.  This has been managed medically in the past. -Started on aspirin and Plavix at the recommendation of neurology.  He is on high intensity statin.  He was not treated with heparin due to acute stroke. -Discussed his case with neurology.  They are okay to proceed with heart catheterization if  the patient is willing.  I discussed this with the patient.  His chest pain symptoms have improved.  He really wants to go home.  I suspect he will be here while dialysis is sorted out however he would like to hold on any heart procedures.  We will see how he does while he is here.  Neurology is okay to intensify his blood pressure regimen today.  I have added carvedilol 25 mg twice daily.  I have added Imdur 30 mg daily.  He is on hydralazine 100 mg 3 times daily. -Given new start on dialysis and stroke it may be better just to follow him as an outpatient and consider a catheterization at a later date.  He really is not wanting to pursue any heart procedures at this time.  Cardiology will follow along and treat him medically for now.  # Acute systolic heart failure, EF 45-50% -Volume status seems acceptable.  On hemodialysis now. -Neurology okay to get him on blood pressure medications.  Start carvedilol 25 mg twice daily.  Start Imdur 30 mg daily.  Continue hydralazine 100 mg 3 times daily. -No ACE/ARB/ARNI/MRA given ESRD.  # Acute lacunar stroke -Found to have new stroke since admission which is likely related to small vessel disease.  Aspirin and Plavix recommended by neurology.  They have also recommended a 30-day outpatient monitor to exclude arrhythmia.  We will set this up closer to discharge. -Continue high intensity statin. -Continue with strict glycemic control.  # Diabetes -Management per hospital medicine    For questions or updates, please contact [place] Please consult [email or web address] for contact info under    Signed, [place] T. Audie Box, MD, [place]  05/08/2022 8:51 AM

## 2022-05-08 NOTE — Progress Notes (Addendum)
STROKE TEAM PROGRESS NOTE   INTERVAL HISTORY Patient is seen in his room with no family at the bedside.  He is agreeable to having his MRA today but requests Ativan due to claustrophobia.  His vital signs have remained stable and his neurological exam is unchanged.  Vitals:   05/08/22 0432 05/08/22 0546 05/08/22 0749 05/08/22 1123  BP: (!) 164/71 (!) 185/79 (!) 164/90 (!) 164/82  Pulse: 79 85 89   Resp: (!) 25 20 (!) 22 (!) 22  Temp: 97.7 F (36.5 C) 98 F (36.7 C) 98 F (36.7 C) 98.2 F (36.8 C)  TempSrc: Oral Oral Oral Oral  SpO2: 98%  98%   Weight:  75.1 kg    Height:       CBC:  Recent Labs  Lab 05/05/22 0025 05/06/22 0026  WBC 4.2 4.5  HGB 7.3* 8.7*  HCT 23.2* 27.4*  MCV 100.4* 97.2  PLT 191 762    Basic Metabolic Panel:  Recent Labs  Lab 05/07/22 0029 05/08/22 0548  NA 139 138  K 4.8 3.7  CL 111 103  CO2 17* 24  GLUCOSE 96 74  BUN 57* 31*  CREATININE 10.45* 6.63*  CALCIUM 8.5* 8.5*  PHOS 5.6* 3.0    Lipid Panel:  Recent Labs  Lab 05/05/22 0025  CHOL 156  TRIG 76  HDL 34*  CHOLHDL 4.6  VLDL 15  LDLCALC 107*    HgbA1c: No results for input(s): "HGBA1C" in the last 168 hours. Urine Drug Screen: No results for input(s): "LABOPIA", "COCAINSCRNUR", "LABBENZ", "AMPHETMU", "THCU", "LABBARB" in the last 168 hours.  Alcohol Level No results for input(s): "ETH" in the last 168 hours.  IMAGING past 24 hours VAS US DUPLEX DIALYSIS ACCESS (AVF, AVG)  Result Date: 05/08/2022 DIALYSIS ACCESS Patient Name:  KAESON KLEINERT  Date of Exam:   05/08/2022 Medical Rec #: [id number]      Accession #:    [id number] Date of Birth: 1965-07-04      Patient Gender: M Patient Age:   56 years Exam Location:  [facility] Procedure:      VAS US DUPLEX DIALYSIS ACCESS (AVF, AVG) Referring Phys: Harrell Gave DICKSON --------------------------------------------------------------------------------  Reason for Exam: Evaluation prior to placement of dialysis access. Access Site:  Left Upper Extremity. Access Type: Basilic vein transposition. History: 48/09/4625 - LEFT BASILIC VEIN FISTULA CREATION. Comparison Study: No prior studies. Performing Technologist: Oliver Hum RVT  Examination Guidelines: A complete evaluation includes B-mode imaging, spectral Doppler, color Doppler, and power Doppler as needed of all accessible portions of each vessel. Unilateral testing is considered an integral part of a complete examination. Limited examinations for reoccurring indications may be performed as noted.  Findings:  +------------+----------+-------------+----------+--------+ OUTFLOW VEINPSV (cm/s)Diameter (cm)Depth (cm)Describe +------------+----------+-------------+----------+--------+ Prox UA                   0.48                        +------------+----------+-------------+----------+--------+ Mid UA                    0.45                        +------------+----------+-------------+----------+--------+ Dist UA                   0.68                Branch  +------------+----------+-------------+----------+--------+ An  area of stenosis is noted in the distal segment of the basilic vein near the anastimosis. Pre 0.76 cm, at 0.37 cm, post 0.72 cm.     --------------------------------------------------------------------------------   Preliminary    DG CHEST PORT 1 VIEW  Result Date: 05/07/2022 CLINICAL DATA:  Status post insertion of hemodialysis catheter. EXAM: PORTABLE CHEST 1 VIEW COMPARISON:  Chest radiograph 05/04/2022 and earlier FINDINGS: Interval placement of right IJ central venous catheter with the distal tip of the level of the mid SVC. Stable enlarged cardiomediastinal silhouette which could be exaggerated secondary to portable technique and patient positioning. Aortic calcifications. No focal consolidation, pleural effusion, or pneumothorax. IMPRESSION: Interval placement of right IJ central venous catheter with the distal tip of the level of the mid  SVC. Electronically Signed   By: Ileana Roup M.D.   On: 05/07/2022 14:45    PHYSICAL EXAM General: Alert, chronically ill-appearing patient in no acute distress with clouding of the right eye Respiratory: Regular, unlabored respirations on room air   NEURO:  Mental Status: AA&Ox3  Speech/Language: speech is with no dysarthria or aphasia  Cranial Nerves:  II: Left pupil round and reactive, right eye clouded III, IV, VI: EOMI. on left V: Sensation is intact to light touch and symmetrical to face.  VII: Smile is symmetrical.   VIII: hearing intact to voice. IX, X: Phonation normal JY:NWGNFAOZ shrug 5/5. XII: tongue is midline without fasciculations. Motor: 5/5 strength to all muscle groups tested.  Tone: is normal and bulk is normal Sensation- Intact to light touch bilaterally. Coordination: FTN intact bilaterally.No drift.  Gait- deferred    ASSESSMENT/PLAN Mr. Koa Zoeller is a 56 y.o. male with history of chronic kidney disease stage V, hypertension, GERD and latent TB who originally presented with chest pain but developed dysarthria and left facial droop.  Stroke Workup was pursued, but patient did not receive TNK due to symptoms being too mild to treat.  MRI shows small acute infarcts in right posterior pons and left hemisphere.  Stroke: Acute infarct in right posterior pons and subcortical left hemisphere Etiology: likely small vessel disease CT head No acute abnormality. Small vessel disease.  MRI small acute infarct in right posterior pons and subcortical left hemisphere MRA pending Carotid Doppler 40 to 59% stenosis in bilateral ICAs 2D Echo EF 45 to 50%, mild LVH, grade 2 diastolic dysfunction, severely dilated left atrium, no atrial level shunt Will consider 30-day cardiac monitor as outpt given left atrial dilation LDL 107 HgbA1c 4.7 VTE prophylaxis -SCDs aspirin 81 mg daily prior to admission, now on aspirin 81 mg daily and clopidogrel 75 mg daily DAPT. Continue  DAPT until follow up with cardiology as outpt and decide on further duration.  Therapy recommendations: Pending Disposition: Pending  Hypertension Home meds: Clonidine 0.1 mg 3 times daily, hydralazine 100 mg 3 times daily Stable On coreg, hydralazine, imdur per cardiology Long-term BP goal normotensive  Hyperlipidemia Home meds: None LDL 107, goal < 70 Add atorvastatin 40 mg daily Continue statin at discharge  Tobacco abuse Current smoker Smoking cessation counseling provided Pt is willing to quit  Other Stroke Risk Factors Cardiomyopathy EF 45-50% Coronary artery disease - elevated troponin, unstable angina - cardiology on board - plan for cath as outpt - continue DAPT  Other Active Problems Latent TB-continue home medications End-stage renal disease-patient will have trial of dialysis today  Hospital day # [place] , MSN, AGACNP-BC [facility] See Amion for schedule and pager information 05/08/2022  1:05 PM   ATTENDING NOTE: I reviewed above note and agree with the assessment and plan.   No acute event overnight. Neuro stable. Plan for HD catheter today. Cardiology on board, started BP meds and now on DAPT. Continue until outpt follow up with cardiology. Continue statin. Recommend 30 day outpt cardiac event monitoring.   For detailed assessment and plan, please refer to above/below as I have made changes wherever appropriate.   Neurology will sign off. Please call with questions. Pt will follow up with stroke clinic NP at [facility] in about 4 weeks. Thanks for the consult.   Rosalin Hawking, MD PhD Stroke Neurology 05/08/2022 3:57 PM    To contact Stroke Continuity provider, please refer to [email or web address] After hours, contact General Neurology

## 2022-05-09 ENCOUNTER — Inpatient Hospital Stay (HOSPITAL_COMMUNITY): Payer: Medicaid Other

## 2022-05-09 DIAGNOSIS — I200 Unstable angina: Secondary | ICD-10-CM | POA: Diagnosis not present

## 2022-05-09 DIAGNOSIS — I132 Hypertensive heart and chronic kidney disease with heart failure and with stage 5 chronic kidney disease, or end stage renal disease: Secondary | ICD-10-CM | POA: Diagnosis not present

## 2022-05-09 DIAGNOSIS — R0602 Shortness of breath: Secondary | ICD-10-CM | POA: Diagnosis not present

## 2022-05-09 DIAGNOSIS — I639 Cerebral infarction, unspecified: Secondary | ICD-10-CM | POA: Diagnosis not present

## 2022-05-09 DIAGNOSIS — N186 End stage renal disease: Secondary | ICD-10-CM | POA: Diagnosis not present

## 2022-05-09 DIAGNOSIS — D649 Anemia, unspecified: Secondary | ICD-10-CM | POA: Diagnosis not present

## 2022-05-09 DIAGNOSIS — I10 Essential (primary) hypertension: Secondary | ICD-10-CM | POA: Diagnosis not present

## 2022-05-09 DIAGNOSIS — R079 Chest pain, unspecified: Secondary | ICD-10-CM | POA: Diagnosis not present

## 2022-05-09 DIAGNOSIS — I998 Other disorder of circulatory system: Secondary | ICD-10-CM

## 2022-05-09 DIAGNOSIS — I5041 Acute combined systolic (congestive) and diastolic (congestive) heart failure: Secondary | ICD-10-CM | POA: Diagnosis not present

## 2022-05-09 DIAGNOSIS — R7989 Other specified abnormal findings of blood chemistry: Secondary | ICD-10-CM | POA: Diagnosis not present

## 2022-05-09 DIAGNOSIS — I5042 Chronic combined systolic (congestive) and diastolic (congestive) heart failure: Secondary | ICD-10-CM | POA: Diagnosis not present

## 2022-05-09 DIAGNOSIS — E875 Hyperkalemia: Secondary | ICD-10-CM | POA: Diagnosis not present

## 2022-05-09 DIAGNOSIS — N185 Chronic kidney disease, stage 5: Secondary | ICD-10-CM | POA: Diagnosis not present

## 2022-05-09 DIAGNOSIS — R072 Precordial pain: Secondary | ICD-10-CM | POA: Diagnosis not present

## 2022-05-09 LAB — RENAL FUNCTION PANEL
Albumin: 2.8 g/dL — ABNORMAL LOW (ref 3.5–5.0)
Anion gap: 11 (ref 5–15)
BUN: 42 mg/dL — ABNORMAL HIGH (ref 6–20)
CO2: 24 mmol/L (ref 22–32)
Calcium: 8.3 mg/dL — ABNORMAL LOW (ref 8.9–10.3)
Chloride: 104 mmol/L (ref 98–111)
Creatinine, Ser: 7.75 mg/dL — ABNORMAL HIGH (ref 0.61–1.24)
GFR, Estimated: 7 mL/min — ABNORMAL LOW (ref >60)
Glucose, Bld: 101 mg/dL — ABNORMAL HIGH (ref 70–99)
Phosphorus: 4.1 mg/dL (ref 2.5–4.6)
Potassium: 4.3 mmol/L (ref 3.5–5.1)
Sodium: 139 mmol/L (ref 135–145)

## 2022-05-09 MED ORDER — ISOSORBIDE MONONITRATE ER 60 MG PO TB24
60.0000 mg | ORAL_TABLET | Freq: Every day | ORAL | Status: DC
  Administered 2022-05-10 – 2022-05-12 (×3): 60 mg via ORAL
  Filled 2022-05-09 (×3): qty 1

## 2022-05-09 MED ORDER — NICOTINE 14 MG/24HR TD PT24
14.0000 mg | MEDICATED_PATCH | Freq: Every day | TRANSDERMAL | Status: DC
  Administered 2022-05-09 – 2022-05-12 (×4): 14 mg via TRANSDERMAL
  Filled 2022-05-09 (×4): qty 1

## 2022-05-09 MED ORDER — CAMPHOR-MENTHOL 0.5-0.5 % EX LOTN
TOPICAL_LOTION | Freq: Three times a day (TID) | CUTANEOUS | Status: DC | PRN
  Filled 2022-05-09: qty 222

## 2022-05-09 MED ORDER — CHLORHEXIDINE GLUCONATE CLOTH 2 % EX PADS
6.0000 | MEDICATED_PAD | Freq: Every day | CUTANEOUS | Status: DC
  Administered 2022-05-10: 6 via TOPICAL

## 2022-05-09 MED ORDER — AMLODIPINE BESYLATE 5 MG PO TABS
5.0000 mg | ORAL_TABLET | Freq: Every day | ORAL | Status: DC
  Administered 2022-05-09 – 2022-05-10 (×2): 5 mg via ORAL
  Filled 2022-05-09 (×2): qty 1

## 2022-05-09 NOTE — Progress Notes (Signed)
Telemetry called to inform me that the patient was off the monitor, RN went into the room and assess the patient and found the patient in the bathroom. Patient said he was almost finished and RN helped him back to the chair. While assisting the patient I found the patient had the slight odor of nicotine, when confronted with this the patient confessed to smoking "a puff." Informed the patient of the no smoking policy, and made a plan with the patient for the nursing staff to hold his cigarettes and lighter until discharge and to page the MD for a nicotine patch. Patient agreeable to this plan.

## 2022-05-09 NOTE — Progress Notes (Signed)
Patient is ready for [facility] tomorrow per vascular surgery noted from this morning.  Will make patient NPO at [facility]; however, unclear if IR will be able to accommodate the procedure tomorrow due multiple add on during the long weekend and limited staffing next week.   Please continue to use temp cath for HD for now, IR will try to accommodate [facility] placement tomorrow.    Armando Gang Jaylani Mcguinn PA-C 05/09/2022 9:17 AM

## 2022-05-09 NOTE — Evaluation (Signed)
Physical Therapy Evaluation Patient Details Name: Cody Webster MRN: [id number] DOB: [birth year] Today's Date: 05/09/2022  History of Present Illness  Pt is a 56 y.o. male admitted 05/04/22 with chest pain. 12/21 pt developed dysarthria, L facial droop. 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 Pt having iHD trial while admitted. Cardiac cath deferred to outpatient. PMH includes ESRD, HTN, GERD, CHF, latent TB, chronic back pain, tobacco use.   Clinical Impression  Pt presents with an overall decrease in functional mobility secondary to above. PTA, pt lives alone, independent and sedentary; has family nearby who assists with transportation and iADLs as needed. Today, pt ambulatory without DME, multiple bouts of self-corrected instability with pt reports is baseline due to visual deficits. Pt declines need for follow-up PT services; family present at end of session, able to verify that family provides necessary assist. Pt would benefit from continued acute PT services to maximize functional mobility and independence prior to d/c home.    Recommendations for follow up therapy are one component of a multi-disciplinary discharge planning process, led by the attending physician.  Recommendations may be updated based on patient status, additional functional criteria and insurance authorization.  Follow Up Recommendations No PT follow up (pt declined)      Assistance Recommended at Discharge Intermittent Supervision/Assistance  Patient can return home with the following  Assistance with cooking/housework;Assist for transportation;Help with stairs or ramp for entrance;Direct supervision/assist for financial management;Direct supervision/assist for medications management    Equipment Recommendations None recommended by PT  Recommendations for Other Services       Functional Status Assessment Patient has had a recent decline in their functional status and  demonstrates the ability to make significant improvements in function in a reasonable and predictable amount of time.     Precautions / Restrictions Precautions Precautions: Fall;Other (comment) Precaution Comments: h/o R eye blindness Restrictions Weight Bearing Restrictions: No      Mobility  Bed Mobility Overal bed mobility: Independent                  Transfers Overall transfer level: Independent                      Ambulation/Gait Ambulation/Gait assistance: Supervision, Min guard Gait Distance (Feet): 220 Feet Assistive device: None Gait Pattern/deviations: Step-through pattern, Decreased stride length, Staggering right, Drifts right/left Gait velocity: Decreased     General Gait Details: slow, mildly unsteady gait with multiple self-corrected bouts of instability  Stairs            Wheelchair Mobility    Modified Rankin (Stroke Patients Only) Modified Rankin (Stroke Patients Only) Pre-Morbid Rankin Score: Slight disability Modified Rankin: Moderate disability     Balance Overall balance assessment: Needs assistance   Sitting balance-Leahy Scale: Good     Standing balance support: No upper extremity supported, During functional activity Standing balance-Leahy Scale: Fair                               Pertinent Vitals/Pain Pain Assessment Pain Assessment: Faces Faces Pain Scale: Hurts a little bit Pain Location: "everywhere" Pain Descriptors / Indicators: Discomfort Pain Intervention(s): Monitored during session    Home Living Family/patient expects to be discharged to:: Private residence Living Arrangements: Alone Available Help at Discharge: Family;Available 24 hours/day Type of Home: Apartment Home Access: Level entry       Home Layout: One level Home Equipment:  None Additional Comments: multiple supportive family nearby who check on him frequently    Prior Function Prior Level of Function :  Independent/Modified Independent             Mobility Comments: independent without DME, reports poor balance baseline "because I can't see", primarily sedentary. Does not drive due to baseline visual deficits ADLs Comments: family assists with household tasks (including cooking, cleaning) as needed. family drives     Hand Dominance        Extremity/Trunk Assessment   Upper Extremity Assessment Upper Extremity Assessment: Generalized weakness    Lower Extremity Assessment Lower Extremity Assessment: Generalized weakness       Communication   Communication: No difficulties  Cognition Arousal/Alertness: Awake/alert Behavior During Therapy: WFL for tasks assessed/performed Overall Cognitive Status: No family/caregiver present to determine baseline cognitive functioning                                 General Comments: WFL for simple tasks; decreased awareness of deficits, suspect low health literacy. suspect baseline cognition        General Comments General comments (skin integrity, edema, etc.): pt's family present at end of session (brother and niece)    Exercises     Assessment/Plan    PT Assessment Patient needs continued PT services  PT Problem List Decreased strength;Decreased activity tolerance;Decreased balance;Decreased mobility       PT Treatment Interventions DME instruction;Gait training;Stair training;Functional mobility training;Therapeutic activities;Therapeutic exercise;Balance training    PT Goals (Current goals can be found in the Care Plan section)  Acute Rehab PT Goals Patient Stated Goal: home today PT Goal Formulation: With patient Time For Goal Achievement: 05/23/22 Potential to Achieve Goals: Good    Frequency Min 3X/week     Co-evaluation               AM-PAC PT "6 Clicks" Mobility  Outcome Measure Help needed turning from your back to your side while in a flat bed without using bedrails?: None Help needed  moving from lying on your back to sitting on the side of a flat bed without using bedrails?: None Help needed moving to and from a bed to a chair (including a wheelchair)?: A Little Help needed standing up from a chair using your arms (e.g., wheelchair or bedside chair)?: None Help needed to walk in hospital room?: A Little Help needed climbing 3-5 steps with a railing? : A Little 6 Click Score: 21    End of Session Equipment Utilized During Treatment: Gait belt Activity Tolerance: Patient tolerated treatment well Patient left: in bed;with call bell/phone within reach;with family/visitor present Nurse Communication: Mobility status PT Visit Diagnosis: Other abnormalities of gait and mobility (R26.89);Muscle weakness (generalized) (M62.81)    Time: 2376-2831 PT Time Calculation (min) (ACUTE ONLY): 13 min   Charges:   PT Evaluation $PT Eval Low Complexity: [place], PT, DPT Acute Rehabilitation Services  Personal: [facility] Office: [place] 05/09/2022, 10:09 AM

## 2022-05-09 NOTE — Progress Notes (Signed)
Patient ID: Cody Webster, male   DOB: 1966-01-31, 56 y.o.   MRN: [id number] S: seen in room, no c/o's.   O:BP (!) 153/62 (BP Location: Right Arm)   Pulse 79   Temp 98.2 F (36.8 C) (Oral)   Resp 16   Ht '6\' 1"'$  (1.854 m)   Wt 75.1 kg   SpO2 94%   BMI 21.84 kg/m   Exam Gen: NAD CVS: RRR Resp: CTA Abd: +BS, soft, NT/ND Ext: no edema, LUE AVF +T/B  Assessment/Plan: Acute CVA's - small acute infarcts in right posterior pons and subcortical left cerebral hemisphere.  Underlying severe chronic small vessel ischemia.  Neuro following.  ESRD - presented w/ uremic symptoms and anemia. Initially refused HD but now is amenable for a short trial of HD to see how he tolerates it.  1st HD session was 12/23, next HD Tuesday 12/26.  Will need TDC placement prior to discharge and IR is aware. Also will need CLIP to OP HD unit.  Vascular access - pt had LUE BVT surgery 2 years ago, but AVF it is not yet mature and will require 2nd stage surgery.  Had temp cath placed 05/07/22 by Dr. Ander Slade.  Dr. Scot Dock will arrange for second stage or new access as on outpatient.  Have consulted IR for [facility] placement. Chest pain - likely due to demand ischemia from profound anemia, however also has many risk factors for CAD Anemia of CKD stage V - agree with blood transfusions. Started weekly darbe 60 mcg SQ on 12/21  Chronic combined systolic and diastolic CHF Metabolic acidosis - started oral sodium bicarb HTN - poorly controlled.  Carvedilol, hydralazine, and imdur as at home.  Allowing passive HTN per neuro due to recent ischemic strokes. 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  Phos elevated will start binders and follow. Itching - likely due to uremia.  Started atarax, Sarna prn Disposition - needs TDC and CLIP to OP HD unit prior to [employer], MD 05/09/2022, 12:30 PM  Recent Labs  Lab 05/05/22 0025 05/06/22 0026 05/07/22 0029 05/08/22 0548 05/09/22 0032  HGB 7.3* 8.7*  --   --   --    ALBUMIN 3.0* 3.0*   < > 3.0* 2.8*  CALCIUM 7.9* 8.1*   < > 8.5* 8.3*  PHOS 5.5* 5.7*   < > 3.0 4.1  CREATININE 9.72* 10.19*   < > 6.63* 7.75*  K 4.8 4.8   < > 3.7 4.3   < > = values in this interval not displayed.    Inpatient medications:  (feeding supplement) PROSource Plus  30 mL Oral BID BM   amLODipine  5 mg Oral Daily   aspirin EC  81 mg Oral QPM   atorvastatin  40 mg Oral Daily   carvedilol  12.5 mg Oral BID WC   Chlorhexidine Gluconate Cloth  6 each Topical Daily   clopidogrel  75 mg Oral Daily   darbepoetin (ARANESP) injection - NON-DIALYSIS  60 mcg Subcutaneous Q Thu-1800   feeding supplement (NEPRO CARB STEADY)  237 mL Oral BID BM   hydrALAZINE  100 mg Oral TID   hydrOXYzine  10 mg Oral TID   [START ON 05/10/2022] isosorbide mononitrate  60 mg Oral Daily   loratadine  10 mg Oral Daily   multivitamin  1 tablet Oral QHS   pantoprazole  40 mg Oral QHS   sevelamer carbonate  800 mg Oral TID WC   sodium bicarbonate  650 mg Oral BID    acetaminophen, diphenhydrAMINE, fluticasone, hydrALAZINE, HYDROmorphone (DILAUDID) injection, labetalol, nitroGLYCERIN, oxyCODONE-acetaminophen **AND** oxyCODONE, prochlorperazine, traZODone

## 2022-05-09 NOTE — Progress Notes (Signed)
PROGRESS NOTE    Cody Webster  VFI:[id number] DOB: October 28, 1965 DOA: 05/04/2022 PCP: Kerin Perna, NP   Brief Narrative:  Cody Webster is a 56 y.o. male with medical history significant of CKD5, HTN, GERD, latent TB. Presented with chest pain. He reports that he was on medication for latent TB, but he stopped because he thought it was making him sick. No other complaint.  Admitted to hospital service at [facility].  Cardiology and nephrology consulted.  Nephrology recommended hemodialysis, he has functional LUE AVF but patient refused dialysis in the beginning but eventually agreed and he was transferred to [facility] for that reason.  Cardiology also followed him.  They are recommending cardiac cath at some point in time during this hospitalization.  On the evening of 05/05/2022, patient was noted to have dysarthria and facial droop and code stroke was called.  MRI shows acute infarct in right posterior pons and subcortical left cerebral hemisphere.  Neurology consulted.  Assessment & Plan:   Principal Problem:   Chest pain Active Problems:   Essential hypertension   CKD (chronic kidney disease) stage 5, GFR less than 15 ml/min (HCC)   Symptomatic anemia   TB lung, latent   Metabolic acidosis   CKD (chronic kidney disease), stage V (HCC)   Acute ischemic stroke (HCC)   Acute congestive heart failure (HCC)  Chest pain: Troponin elevated but flat.  Echo shows slightly reduced LVEF, previously it was 50 to 55% and now it is 45 to 50%.  Also has regional wall motion from release with hypokinesis.  Grade 2 diastolic dysfunction.  Cardiology on board and initially plans were to do cardiac catheterization during hospitalization however now cardiology has decided to defer this to outpatient.  They recommend treating medically for now.  CKD stage V/hyperphosphatemia/hyperkalemia/metabolic acidosis/uremia: Nephrology on board, patient has nonfunctioning LUE AVF.  Patient underwent  temporary right IJ HD catheter placed by PCCM on 05/07/2022 and he was started on hemodialysis . Per vascular surgery , His duplex of his first stage left basilic vein transposition shows that the fistula is small and therefore he is not a candidate for a second stage basilic vein transposition. This fistula has been in for 2 years. He will need to be evaluated for new access.  They are planning to do this as outpatient, IR is consulted for tunneled catheter tomorrow.  Nephrology on board for dialysis and he is due for dialysis tomorrow again.  Acute ischemic stroke/essential hypertension: MRI report shows acute ischemic stroke.  He is only on aspirin and now also on Plavix.  He is on atorvastatin as well. Neurology recommends 30-day CardioNet monitor as outpatient to rule out A-fib.  It has been 48 hours since he had a stroke so I started him on hydralazine 100 mg 3 times daily this morning but it appears that cardiology had discussed with neurology and they have cleared him to be started on more medication so he was started on Imdur and Coreg as well by cardiology.    Anemia of chronic disease: Patient's hemoglobin dropped to 7 on 05/04/2022 and he received 1 unit of PRBC transfusion.  Hemoglobin improved.  Latent TB: Patient stopped taking medications and he is refusing again.  He has the capacity to make decisions for himself.  DVT prophylaxis: SCDs Start: 05/04/22 2227   Code Status: DNR  Family Communication:  None present at bedside.  Plan of care discussed with patient in length and he/she verbalized understanding and agreed with  it.  Status is: Inpatient Remains inpatient appropriate because: Plan for permanent catheter tomorrow.   Estimated body mass index is 21.84 kg/m as calculated from the following:   Height as of this encounter: '6\' 1"'$  (1.854 m).   Weight as of this encounter: 75.1 kg.    Nutritional Assessment: Body mass index is 21.84 kg/m.Marland Kitchen Seen by dietician.  I agree with  the assessment and plan as outlined below: Nutrition Status: Nutrition Problem: Increased nutrient needs Etiology: acute illness, chronic illness Signs/Symptoms: estimated needs Interventions: Nepro shake, Prostat  . Skin Assessment: I have examined the patient's skin and I agree with the wound assessment as performed by the wound care RN as outlined below:    Consultants:  Nephrology Cardiology Neurology   Procedures:  As above  Antimicrobials:  Anti-infectives (From admission, onward)    None         Subjective:  Seen and Cody Webster.  No complaints however continues to say that he wants to go home.  Objective: Vitals:   05/08/22 2351 05/09/22 0400 05/09/22 0647 05/09/22 0800  BP: (!) 149/71 (!) 154/63 (!) 166/75 (!) 153/62  Pulse: 73 78 80 79  Resp: 16 16    Temp: 98.2 F (36.8 C) 98.2 F (36.8 C)    TempSrc: Oral Oral    SpO2: 97% 94%    Weight:      Height:        Intake/Output Summary (Last 24 hours) at 05/09/2022 1010 Last data filed at 05/09/2022 0432 Gross per 24 hour  Intake 240 ml  Output 400 ml  Net -160 ml    Filed Weights   05/05/22 2300 05/08/22 0130 05/08/22 0546  Weight: 77.5 kg 76.6 kg 75.1 kg    Examination:  General exam: Appears calm and comfortable  Respiratory system: Clear to auscultation. Respiratory effort normal. Cardiovascular system: S1 & S2 heard, RRR. No JVD, murmurs, rubs, gallops or clicks. No pedal edema. Gastrointestinal system: Abdomen is nondistended, soft and nontender. No organomegaly or masses felt. Normal bowel sounds heard. Central nervous system: Alert and oriented. No focal neurological deficits. Extremities: Symmetric 5 x 5 power. Skin: No rashes, lesions or ulcers.  Psychiatry: Judgement and insight appear normal. Mood & affect appropriate.   Data Reviewed: I have personally reviewed following labs and imaging studies  CBC: Recent Labs  Lab 05/04/22 1319 05/05/22 0025 05/06/22 0026  WBC 4.2 4.2  4.5  HGB 7.0* 7.3* 8.7*  HCT 22.8* 23.2* 27.4*  MCV 101.8* 100.4* 97.2  PLT 187 191 614    Basic Metabolic Panel: Recent Labs  Lab 05/05/22 0025 05/06/22 0026 05/07/22 0029 05/08/22 0548 05/09/22 0032  NA 137 138 139 138 139  K 4.8 4.8 4.8 3.7 4.3  CL 109 110 111 103 104  CO2 19* 18* 17* 24 24  GLUCOSE 84 88 96 74 101*  BUN 58* 59* 57* 31* 42*  CREATININE 9.72* 10.19* 10.45* 6.63* 7.75*  CALCIUM 7.9* 8.1* 8.5* 8.5* 8.3*  PHOS 5.5* 5.7* 5.6* 3.0 4.1    GFR: Estimated Creatinine Clearance: 11 mL/min (A) (by C-G formula based on SCr of 7.75 mg/dL (H)). Liver Function Tests: Recent Labs  Lab 05/05/22 0025 05/06/22 0026 05/07/22 0029 05/08/22 0548 05/09/22 0032  ALBUMIN 3.0* 3.0* 3.4* 3.0* 2.8*    No results for input(s): "LIPASE", "AMYLASE" in the last 168 hours. No results for input(s): "AMMONIA" in the last 168 hours. Coagulation Profile: No results for input(s): "INR", "PROTIME" in the last 168 hours. Cardiac Enzymes:  No results for input(s): "CKTOTAL", "CKMB", "CKMBINDEX", "TROPONINI" in the last 168 hours. BNP (last 3 results) No results for input(s): "PROBNP" in the last 8760 hours. HbA1C: No results for input(s): "HGBA1C" in the last 72 hours. CBG: Recent Labs  Lab 05/05/22 2138  GLUCAP 105*    Lipid Profile: No results for input(s): "CHOL", "HDL", "LDLCALC", "TRIG", "CHOLHDL", "LDLDIRECT" in the last 72 hours.  Thyroid Function Tests: No results for input(s): "TSH", "T4TOTAL", "FREET4", "T3FREE", "THYROIDAB" in the last 72 hours. Anemia Panel: No results for input(s): "VITAMINB12", "FOLATE", "FERRITIN", "TIBC", "IRON", "RETICCTPCT" in the last 72 hours.  Sepsis Labs: No results for input(s): "PROCALCITON", "LATICACIDVEN" in the last 168 hours.  Recent Results (from the past 240 hour(s))  Resp panel by RT-PCR (RSV, Flu A&B, Covid) Anterior Nasal Swab     Status: None   Collection Time: 05/04/22  1:02 PM   Specimen: Anterior Nasal Swab  Result  Value Ref Range Status   SARS Coronavirus 2 by RT PCR NEGATIVE NEGATIVE Final    Comment: (NOTE) SARS-CoV-2 target nucleic acids are NOT DETECTED.  The SARS-CoV-2 RNA is generally detectable in upper respiratory specimens during the acute phase of infection. The lowest concentration of SARS-CoV-2 viral copies this assay can detect is 138 copies/mL. A negative result does not preclude SARS-Cov-2 infection and should not be used as the sole basis for treatment or other patient management decisions. A negative result may occur with  improper specimen collection/handling, submission of specimen other than nasopharyngeal swab, presence of viral mutation(s) within the areas targeted by this assay, and inadequate number of viral copies(<138 copies/mL). A negative result must be combined with clinical observations, patient history, and epidemiological information. The expected result is Negative.  Fact Sheet for Patients:  EntrepreneurPulse.com.au  Fact Sheet for Healthcare Providers:  IncredibleEmployment.be  This test is no t yet approved or cleared by the [place] FDA and  has been authorized for detection and/or diagnosis of SARS-CoV-2 by FDA under an Emergency Use Authorization (EUA). This EUA will remain  in effect (meaning this test can be used) for the duration of the COVID-19 declaration under Section 564(b)(1) of the Act, 21 U.S.C.section 360bbb-3(b)(1), unless the authorization is terminated  or revoked sooner.       Influenza A by PCR NEGATIVE NEGATIVE Final   Influenza B by PCR NEGATIVE NEGATIVE Final    Comment: (NOTE) The Xpert Xpress SARS-CoV-2/FLU/RSV plus assay is intended as an aid in the diagnosis of influenza from Nasopharyngeal swab specimens and should not be used as a sole basis for treatment. Nasal washings and aspirates are unacceptable for Xpert Xpress SARS-CoV-2/FLU/RSV testing.  Fact Sheet for  Patients: EntrepreneurPulse.com.au  Fact Sheet for Healthcare Providers: IncredibleEmployment.be  This test is not yet approved or cleared by the [place] FDA and has been authorized for detection and/or diagnosis of SARS-CoV-2 by FDA under an Emergency Use Authorization (EUA). This EUA will remain in effect (meaning this test can be used) for the duration of the COVID-19 declaration under Section 564(b)(1) of the Act, 21 U.S.C. section 360bbb-3(b)(1), unless the authorization is terminated or revoked.     Resp Syncytial Virus by PCR NEGATIVE NEGATIVE Final    Comment: (NOTE) Fact Sheet for Patients: EntrepreneurPulse.com.au  Fact Sheet for Healthcare Providers: IncredibleEmployment.be  This test is not yet approved or cleared by the [place] FDA and has been authorized for detection and/or diagnosis of SARS-CoV-2 by FDA under an Emergency Use Authorization (EUA). This EUA will remain in  effect (meaning this test can be used) for the duration of the COVID-19 declaration under Section 564(b)(1) of the Act, 21 U.S.C. section 360bbb-3(b)(1), unless the authorization is terminated or revoked.  Performed at [facility], [place] [address], [place], [place] [address]   MRSA Next Gen by PCR, Nasal     Status: None   Collection Time: 05/05/22 11:41 PM   Specimen: Nasal Mucosa; Nasal Swab  Result Value Ref Range Status   MRSA by PCR Next Gen NOT DETECTED NOT DETECTED Final    Comment: (NOTE) The GeneXpert MRSA Assay (FDA approved for NASAL specimens only), is one component of a comprehensive MRSA colonization surveillance program. It is not intended to diagnose MRSA infection nor to guide or monitor treatment for MRSA infections. Test performance is not FDA approved in patients less than 20 years old. Performed at [facility], [place] [address] [place], [place] [address]       Radiology Studies: MR ANGIO HEAD WO CONTRAST  Result Date: 05/08/2022 CLINICAL DATA:  Follow-up examination for stroke. EXAM: MRA HEAD WITHOUT CONTRAST TECHNIQUE: Angiographic images of the Circle of Willis were acquired using MRA technique without intravenous contrast. COMPARISON:  Comparison made with prior brain MRI from 05/06/2022. FINDINGS: Anterior circulation: Examination moderately degraded by motion artifact, limiting assessment. Visualized distal cervical segments of both internal carotid arteries are widely patent with antegrade flow. Petrous, cavernous, and supraclinoid segments patent without significant stenosis or other visible abnormality. A1 segments grossly patent bilaterally. Left A1 dominant, with a hypoplastic right A1. Grossly normal anterior communicating artery complex. Anterior cerebral arteries patent to their distal aspects without convincing stenosis. Both M1 segments are widely patent. No visible proximal MCA branch occlusion or convincing stenosis. Distal MCA branches perfused and fairly symmetric. Posterior circulation: Both V4 segments patent without stenosis. Right vertebral artery dominant. Left PICA patent. Right PICA not seen. Basilar patent without stenosis. Superior cerebral arteries patent bilaterally. Both PCA supplied via the basilar as well as prominent bilateral posterior communicating arteries. PCAs patent to their distal aspects without visible stenosis. Anatomic variants: None significant. Other: No visible intracranial aneurysm. IMPRESSION: 1. Motion degraded exam. 2. Negative intracranial MRA. No large vessel occlusion. No visible hemodynamically significant or correctable stenosis. Electronically Signed   By: Jeannine Boga M.D.   On: 05/08/2022 22:14   VAS US DUPLEX DIALYSIS ACCESS (AVF, AVG)  Result Date: 05/08/2022 DIALYSIS ACCESS Patient Name:  LATAVION HALLS  Date of Exam:   05/08/2022 Medical Rec #: [id number]      Accession #:    [id number]  Date of Birth: [birth year]      Patient Gender: M Patient Age:   1 years Exam Location:  [facility] Procedure:      VAS US DUPLEX DIALYSIS ACCESS (AVF, AVG) Referring Phys: Harrell Gave DICKSON --------------------------------------------------------------------------------  Reason for Exam: Evaluation prior to placement of dialysis access. Access Site: Left Upper Extremity. Access Type: Basilic vein transposition. History: 40/05/270 - LEFT BASILIC VEIN FISTULA CREATION. Comparison Study: No prior studies. Performing Technologist: Oliver Hum RVT  Examination Guidelines: A complete evaluation includes B-mode imaging, spectral Doppler, color Doppler, and power Doppler as needed of all accessible portions of each vessel. Unilateral testing is considered an integral part of a complete examination. Limited examinations for reoccurring indications may be performed as noted.  Findings:  +------------+----------+-------------+----------+--------+ OUTFLOW VEINPSV (cm/s)Diameter (cm)Depth (cm)Describe +------------+----------+-------------+----------+--------+ Prox UA                   0.48                        +------------+----------+-------------+----------+--------+  Mid UA                    0.45                        +------------+----------+-------------+----------+--------+ Dist UA                   0.68                Branch  +------------+----------+-------------+----------+--------+ An area of stenosis is noted in the distal segment of the basilic vein near the anastimosis. Pre 0.76 cm, at 0.37 cm, post 0.72 cm.  Diagnosing physician: [name] MD Electronically signed by [name] MD on 05/08/2022 at 6:19:24 PM.    --------------------------------------------------------------------------------   Final    DG CHEST PORT 1 VIEW  Result Date: 05/07/2022 CLINICAL DATA:  Status post insertion of hemodialysis catheter. EXAM: PORTABLE CHEST 1 VIEW COMPARISON:   Chest radiograph 05/04/2022 and earlier FINDINGS: Interval placement of right IJ central venous catheter with the distal tip of the level of the mid SVC. Stable enlarged cardiomediastinal silhouette which could be exaggerated secondary to portable technique and patient positioning. Aortic calcifications. No focal consolidation, pleural effusion, or pneumothorax. IMPRESSION: Interval placement of right IJ central venous catheter with the distal tip of the level of the mid SVC. Electronically Signed   By: Ileana Roup M.D.   On: 05/07/2022 14:45    Scheduled Meds:  (feeding supplement) PROSource Plus  30 mL Oral BID BM   aspirin EC  81 mg Oral QPM   atorvastatin  40 mg Oral Daily   carvedilol  12.5 mg Oral BID WC   Chlorhexidine Gluconate Cloth  6 each Topical Daily   clopidogrel  75 mg Oral Daily   darbepoetin (ARANESP) injection - NON-DIALYSIS  60 mcg Subcutaneous Q Thu-1800   feeding supplement (NEPRO CARB STEADY)  237 mL Oral BID BM   hydrALAZINE  100 mg Oral TID   hydrOXYzine  10 mg Oral TID   isosorbide mononitrate  30 mg Oral Daily   loratadine  10 mg Oral Daily   multivitamin  1 tablet Oral QHS   pantoprazole  40 mg Oral QHS   sevelamer carbonate  800 mg Oral TID WC   sodium bicarbonate  650 mg Oral BID   Continuous Infusions:     LOS: 4 days   Darliss Cheney, MD [facility]  05/09/2022, 10:10 AM   *Please note that this is a verbal dictation therefore any spelling or grammatical errors are due to the "[place] [employer]" system interpretation.  Please page via [place] and do not message via secure chat for urgent patient care matters. Secure chat can be used for non urgent patient care matters.  How to contact the [facility] Attending or Consulting provider Alliance or covering provider during after hours [place], for this patient?  Check the care team in [facility] and look for a) attending/consulting [facility] provider listed and b) the [facility] team listed. Page or secure chat [phone]. Log into  [email or web address] and use [place]'s universal password to access. If you do not have the password, please contact the hospital operator. Locate the [facility] provider you are looking for under [facility] and page to a number that you can be directly reached. If you still have difficulty reaching the provider, please page the [facility] (Director on Call) for the Hospitalists listed on amion for assistance.

## 2022-05-09 NOTE — Progress Notes (Signed)
Upper extremity vein mapping RIGHT and bilateral upper extremity arterial study completed.   Please see CV Proc for preliminary results.   Darlin Coco, RDMS, RVT

## 2022-05-09 NOTE — Evaluation (Signed)
Occupational Therapy Evaluation Patient Details Name: Cody Webster MRN: [id number] DOB: 1965/10/14 Today's Date: 05/09/2022   History of Present Illness Pt is a 56 y.o. male admitted 05/04/22 with chest pain. 12/21 pt developed dysarthria, L facial droop. 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 Pt having iHD trial while admitted. Cardiac cath deferred to outpatient. PMH includes ESRD, HTN, GERD, CHF, latent TB, chronic back pain, tobacco use.   Clinical Impression   Pt lives independently and is assisted for IADLs and transportation by his family. He is primarily sedentary and limited due to longstanding R eye blindness. Pt presents with unsteadiness with ambulation he attributes to impaired vision, but no overt LOB with ambulation in room/to bathroom without AD. Educated pt and niece that pt could experience weakness following HD and that DME can be helpful on those days. Pt reports his thinks he has a RW. He is wanting a motorized w/c and will ask the [place] about that. Pt with difficulty relaying to his niece what medical advice he has been receiving. Pt highly focused on wanting to go home. Pt is declining post acute OT, will follow while admitted.      Recommendations for follow up therapy are one component of a multi-disciplinary discharge planning process, led by the attending physician.  Recommendations may be updated based on patient status, additional functional criteria and insurance authorization.   Follow Up Recommendations  No OT follow up     Assistance Recommended at Discharge Set up Supervision/Assistance  Patient can return home with the following A little help with walking and/or transfers;A little help with bathing/dressing/bathroom;Assistance with cooking/housework;Direct supervision/assist for financial management;Assist for transportation;Direct supervision/assist for medications management;Help with stairs or ramp for entrance     Functional Status Assessment  Patient has had a recent decline in their functional status and demonstrates the ability to make significant improvements in function in a reasonable and predictable amount of time.  Equipment Recommendations  None recommended by OT    Recommendations for Other Services       Precautions / Restrictions Precautions Precautions: Fall;Other (comment) Precaution Comments: h/o R eye blindness Restrictions Weight Bearing Restrictions: No      Mobility Bed Mobility Overal bed mobility: Independent             General bed mobility comments: HOB down    Transfers Overall transfer level: Independent Equipment used: None                      Balance Overall balance assessment: Needs assistance   Sitting balance-Leahy Scale: Good     Standing balance support: No upper extremity supported, During functional activity Standing balance-Leahy Scale: Fair                             ADL either performed or assessed with clinical judgement   ADL Overall ADL's : Needs assistance/impaired Eating/Feeding: Independent;Sitting   Grooming: Wash/dry hands;Standing;Supervision/safety   Upper Body Bathing: Set up;Sitting   Lower Body Bathing: Sit to/from stand;Set up   Upper Body Dressing : Set up;Sitting   Lower Body Dressing: Sit to/from stand;Set up   Toilet Transfer: Min guard;Ambulation   Toileting- Clothing Manipulation and Hygiene: Independent;Sit to/from stand       Functional mobility during ADLs: Min guard       Vision Baseline Vision/History: 2 Legally blind Ability to See in Adequate Light: 2 Moderately impaired Patient Visual  Report: No change from baseline Additional Comments: longstanding blindness in R eye     Perception     Praxis      Pertinent Vitals/Pain Pain Assessment Pain Assessment: Faces Faces Pain Scale: Hurts a little bit Pain Location: generalized Pain Descriptors / Indicators:  Discomfort Pain Intervention(s): Monitored during session, Repositioned     Hand Dominance Right   Extremity/Trunk Assessment Upper Extremity Assessment Upper Extremity Assessment: Overall WFL for tasks assessed   Lower Extremity Assessment Lower Extremity Assessment: Defer to PT evaluation       Communication Communication Communication: Expressive difficulties;Other (comment) (dysarthric)   Cognition Arousal/Alertness: Awake/alert Behavior During Therapy: Flat affect Overall Cognitive Status: Impaired/Different from baseline Area of Impairment: Memory                     Memory: Decreased short-term memory         General Comments: suspect baseline, was not able to give information to niece he was told by MDs earlier today, pt highly focused on wanting to go home     General Comments  pt's family present at end of session (brother and niece)    Exercises     Shoulder Instructions      Home Living Family/patient expects to be discharged to:: Private residence Living Arrangements: Alone Available Help at Discharge: Family;Available 24 hours/day Type of Home: Apartment Home Access: Level entry     Home Layout: One level     Bathroom Shower/Tub: Teacher, early years/pre: Standard     Home Equipment: Conservation officer, nature (2 wheels)   Additional Comments: multiple supportive family nearby who check on him frequently      Prior Functioning/Environment Prior Level of Function : Independent/Modified Independent             Mobility Comments: independent without DME, reports poor balance baseline "because I can't see", primarily sedentary. Does not drive due to baseline visual deficits ADLs Comments: family assists with household tasks (including cooking, cleaning) as needed. family drives        OT Problem List: Impaired vision/perception      OT Treatment/Interventions: Self-care/ADL training;DME and/or AE instruction;Therapeutic  activities;Visual/perceptual remediation/compensation;Patient/family education    OT Goals(Current goals can be found in the care plan section) Acute Rehab OT Goals OT Goal Formulation: With patient Time For Goal Achievement: 05/23/22 Potential to Achieve Goals: Good ADL Goals Pt Will Transfer to Toilet: Independently;ambulating Additional ADL Goal #1: Pt will gather items necessary for ADLs around his room independently. Additional ADL Goal #2: Pt will be aware of DME to assist with ADLs for use on HD days should he have weakness.  OT Frequency: Min 2X/week    Co-evaluation              AM-PAC OT "6 Clicks" Daily Activity     Outcome Measure Help from another person eating meals?: None Help from another person taking care of personal grooming?: A Little Help from another person toileting, which includes using toliet, bedpan, or urinal?: A Little Help from another person bathing (including washing, rinsing, drying)?: A Little Help from another person to put on and taking off regular upper body clothing?: None Help from another person to put on and taking off regular lower body clothing?: A Little 6 Click Score: 20   End of Session Equipment Utilized During Treatment: Gait belt  Activity Tolerance: Patient tolerated treatment well Patient left: in bed;with call bell/phone within reach;with nursing/sitter in room (MD in room)  OT Visit Diagnosis: Unsteadiness on feet (R26.81);Muscle weakness (generalized) (M62.81);Other symptoms and signs involving cognitive function                Time: 4600-2984 OT Time Calculation (min): 19 min Charges:  OT General Charges $OT Visit: 1 Visit OT Evaluation $OT Eval Low Complexity: [place], OTR/L Acute Rehabilitation Services Office: [phone]   [name] 05/09/2022, 11:04 AM

## 2022-05-09 NOTE — Progress Notes (Addendum)
Rounding Note    Patient Name: Cody Webster Date of Encounter: 05/09/2022  [place] Cardiologist: Glenetta Hew, MD   Patient Profile     Cody Webster is a 56 y.o. male with diastolic heart failure, CKD stage V, hypertension, diabetes, latent tuberculosis who was admitted on 05/05/2022 with chest pain and acute systolic heart failure.  Course has been complicated by acute stroke and ongoing chest pain.   Subjective   Multiple resting comfortably.  No further chest pain.  Major issue urinalysis detected itching a lot.  He is also quite frustrated about still being in the hospital.  He feels like he is on too many medications.   Assessment & Plan    Elevated Troponin/unstable angina Initially admitted with having some chest pain concerning for possible unstable angina, ischemic evaluation delayed due to small vessel lacunar infarct and progression of CKD 4 to ESRD now on HD. => Troponin levels flat, not consistent with MI, EKG without ischemic changes, however echo does show RWMA and he does have history of a stress test suggesting possible ischemia, although he was relatively asymptomatic being treated medically.  Is also possible that some of the chest pain could have been pleuritic/pericardial from uremic symptoms. No longer having chest pain following initiation of HD. Inpatient hemic evaluation delayed because of CVA and ESRD-patient at this point voiced an interest of simply going home and because may be considering outpatient ischemic evaluation. Neurology has given the okay for cardiac catheterization however less than favorable option given new start dialysis and recent CVA.  Would likely be better to assess once he has had time to recover.  Patient himself would like to forego invasive evaluation with cardiac catheterization at this time. Carvedilol increased to 25 mg twice daily along with addition of Imdur. Increasing Imdur to 60 mg a day and adding moderate-dose  amlodipine Plan is to continue to treat medically and reassess in the outpatient setting.  If he is not interested in cardiac catheterization initially, we can consider Coronary CTA in the outpatient setting.   He is already on aspirin, Plavix, statin Acute Lacuna CVA -> noted to have stroke symptoms admission thought to be related to small vessel disease.  Seen by neurology.  Aspirin Plavix open added Plan is to set up 30-day event monitor on discharge. Continue statin, and ensure close glycemic control. Acute HF - combined (Diastolic with Reduced EF 67-67%) Volume control with HD Using hydralazine/nitrate (over 100 mg hydralazine/ 30 mg Imdur) for afterload reduction along with carvedilol. CKD IV/ESRD - New Start HD Not unexpected progression to ESRD. ->  Initially was having some uremic symptoms along with anemia. Had initially been very reluctant to consider ESRD, but is using his 62-year-old nephew is the impetus to move forward. Vascular surgery involved vascular access issues.  Plan is to arrange for second stage fistula versus new access (hopefully tomorrow per Dr. Scot Dock) => currently using trialysis catheter. Hyperkalemia and metabolic acidosis-treated with Lokelma and oral sodium bicarb now on dialysis.  Renal diet.. Anemia of CKD 4-Per [facility] and nephrology, plans for transfusion. 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  He would like to go home to celebrate the holidays.  Palliative care still being involved since he has been reluctant to go on  hemodialysis. Prior to discharge, will need to set up 30-day event monitor and ensure he has close cardiology follow-up to discuss further ischemic evaluation in the outpatient setting. An outpatient cardiology follow-up, we can consider coronary CTA versus cardiac catheterization depending on his symptoms.    Inpatient Medications    Scheduled Meds:  (feeding supplement) PROSource Plus  30 mL Oral BID BM   aspirin EC  81 mg Oral QPM   atorvastatin  40 mg Oral Daily   carvedilol  12.5 mg Oral BID WC   Chlorhexidine Gluconate Cloth  6 each Topical Daily   clopidogrel  75 mg Oral Daily   darbepoetin (ARANESP) injection - NON-DIALYSIS  60 mcg Subcutaneous Q Thu-1800   feeding supplement (NEPRO CARB STEADY)  237 mL Oral BID BM   hydrALAZINE  100 mg Oral TID   hydrOXYzine  10 mg Oral TID   isosorbide mononitrate  30 mg Oral Daily   loratadine  10 mg Oral Daily   multivitamin  1 tablet Oral QHS   pantoprazole  40 mg Oral QHS   sevelamer carbonate  800 mg Oral TID WC   sodium bicarbonate  650 mg Oral BID   Continuous Infusions:  PRN Meds: acetaminophen, diphenhydrAMINE, fluticasone, hydrALAZINE, HYDROmorphone (DILAUDID) injection, labetalol, nitroGLYCERIN, oxyCODONE-acetaminophen **AND** oxyCODONE, prochlorperazine, traZODone   Vital Signs    Vitals:   05/08/22 2351 05/09/22 0400 05/09/22 0647 05/09/22 0800  BP: (!) 149/71 (!) 154/63 (!) 166/75 (!) 153/62  Pulse: 73 78 80 79  Resp: 16 16    Temp: 98.2 F (36.8 C) 98.2 F (36.8 C)    TempSrc: Oral Oral    SpO2: 97% 94%    Weight:      Height:        Intake/Output Summary (Last 24 hours) at 05/09/2022 0921 Last data filed at 05/09/2022 0432 Gross per 24 hour  Intake 240 ml  Output 400 ml  Net -160 ml      05/08/2022    5:46 AM 05/08/2022    1:30 AM 05/05/2022   11:00 PM  Last 3 Weights  Weight (lbs) 165 lb 9.1 oz 168 lb 14 oz 170 lb 13.7 oz  Weight (kg) 75.1 kg 76.6 kg 77.5 kg      Telemetry    NSR 70s  overnight - Personally Reviewed  ECG    No new study- Personally Reviewed  Physical Exam   GEN: No acute distress.  Resting comfortably. HEENT: Holds his right eye closed. Neck: No JVD; fistula bruit heard. Cardiac: RRR, no murmurs, rubs, or gallops.  Normal S1 and S2 Respiratory: Clear to auscultation bilaterally.  Nonlabored, good air movement.  No W/R/R. GI: Soft, nontender, non-distended  MS: No edema; No deformity. Neuro: Mild right eye ptosis, somewhat slurred speech favoring the right side. Psych: Normal affect   Labs    High Sensitivity Troponin:   Recent Labs  Lab 05/04/22 1319 05/04/22 1511 05/04/22 2251 05/05/22 0025  TROPONINIHS 221* 220* 190* 246*     Chemistry Recent Labs  Lab 05/07/22 0029 05/08/22 0548 05/09/22 0032  NA 139 138 139  K 4.8 3.7 4.3  CL 111 103 104  CO2 17* 24 24  GLUCOSE 96 74 101*  BUN 57* 31* 42*  CREATININE 10.45* 6.63* 7.75*  CALCIUM 8.5* 8.5* 8.3*  ALBUMIN 3.4* 3.0* 2.8*  GFRNONAA 5* 9* 7*  ANIONGAP _0 Lipids  Recent Labs  Lab 05/05/22 0025  CHOL 156  TRIG 76  HDL 34*  LDLCALC 107*  CHOLHDL 4.6    Hematology Recent Labs  Lab 05/04/22 1319 05/05/22 0025 05/06/22 0026  WBC 4.2 4.2 4.5  RBC 2.24* 2.31* 2.82*  HGB 7.0* 7.3* 8.7*  HCT 22.8* 23.2* 27.4*  MCV 101.8* 100.4* 97.2  MCH 31.3 31.6 30.9  MCHC 30.7 31.5 31.8  RDW 15.3 15.0 16.1*  PLT 187 191 178   Thyroid No results for input(s): "TSH", "FREET4" in the last 168 hours.  BNP Recent Labs  Lab 05/04/22 1308  BNP 2,226.4*    DDimer No results for input(s): "DDIMER" in the last 168 hours.   Cardiac Studies   TTE 05/05/2022: EF 45 to 50%.  Mildly decreased function.  Suggested RWMA: Basal to mid anterolateral and basal to mid inferior HK.  Mild concentric LVH.  GR 2 DD with severely dilated LA..  Mild to moderate MR with moderate MAC.  Mild AOV calcification moderately elevated PAP/RVSP-51.2 mmHg.  Mildly dilated RA with RAP estimated 15  mmHg..  Radiology    MR ANGIO HEAD WO CONTRAST  Result Date: 05/08/2022 CLINICAL DATA:  Follow-up examination for stroke. EXAM: MRA HEAD WITHOUT CONTRAST TECHNIQUE: Angiographic images of the Circle of Willis were acquired using MRA technique without intravenous contrast. COMPARISON:  Comparison made with prior brain MRI from 05/06/2022. FINDINGS: Anterior circulation: Examination moderately degraded by motion artifact, limiting assessment. Visualized distal cervical segments of both internal carotid arteries are widely patent with antegrade flow. Petrous, cavernous, and supraclinoid segments patent without significant stenosis or other visible abnormality. A1 segments grossly patent bilaterally. Left A1 dominant, with a hypoplastic right A1. Grossly normal anterior communicating artery complex. Anterior cerebral arteries patent to their distal aspects without convincing stenosis. Both M1 segments are widely patent. No visible proximal MCA branch occlusion or convincing stenosis. Distal MCA branches perfused and fairly symmetric. Posterior circulation: Both V4 segments patent without stenosis. Right vertebral artery dominant. Left PICA patent. Right PICA not seen. Basilar patent without stenosis. Superior cerebral arteries patent bilaterally. Both PCA supplied via the basilar as well as prominent bilateral posterior communicating arteries. PCAs patent to their distal aspects without visible stenosis. Anatomic variants: None significant. Other: No visible intracranial aneurysm. IMPRESSION: 1. Motion degraded exam. 2. Negative intracranial MRA. No large vessel occlusion. No visible hemodynamically significant or correctable stenosis. Electronically Signed   By: Jeannine Boga M.D.   On: 05/08/2022 22:14   VAS US DUPLEX DIALYSIS ACCESS (AVF, AVG)  Result Date: 05/08/2022 DIALYSIS ACCESS Patient Name:  Cody Webster  Date of Exam:   05/08/2022 Medical Rec #: [id number]      Accession #:    [id number]  Date of Birth: 03-16-1966      Patient Gender: M Patient Age:   28 years Exam Location:  [facility] Procedure:      VAS US DUPLEX DIALYSIS ACCESS (AVF, AVG) Referring Phys: Harrell Gave DICKSON --------------------------------------------------------------------------------  Reason for Exam: Evaluation prior to placement of dialysis access. Access Site: Left Upper Extremity. Access Type: Basilic vein transposition. History: 07/18/7423 - LEFT BASILIC VEIN FISTULA CREATION. Comparison Study: No prior studies. Performing Technologist: Oliver Hum RVT  Examination Guidelines: A complete evaluation includes B-mode imaging, spectral Doppler, color Doppler, and power Doppler as needed of all accessible portions of each vessel. Unilateral testing is considered an integral part of a complete examination. Limited examinations for  reoccurring indications may be performed as noted.  Findings:  +------------+----------+-------------+----------+--------+ OUTFLOW VEINPSV (cm/s)Diameter (cm)Depth (cm)Describe +------------+----------+-------------+----------+--------+ Prox UA                   0.48                        +------------+----------+-------------+----------+--------+ Mid UA                    0.45                        +------------+----------+-------------+----------+--------+ Dist UA                   0.68                Branch  +------------+----------+-------------+----------+--------+ An area of stenosis is noted in the distal segment of the basilic vein near the anastimosis. Pre 0.76 cm, at 0.37 cm, post 0.72 cm.  Diagnosing physician: [name] MD Electronically signed by [name] MD on 05/08/2022 at 6:19:24 PM.    --------------------------------------------------------------------------------   Final    DG CHEST PORT 1 VIEW  Result Date: 05/07/2022 CLINICAL DATA:  Status post insertion of hemodialysis catheter. EXAM: PORTABLE CHEST 1 VIEW COMPARISON:   Chest radiograph 05/04/2022 and earlier FINDINGS: Interval placement of right IJ central venous catheter with the distal tip of the level of the mid SVC. Stable enlarged cardiomediastinal silhouette which could be exaggerated secondary to portable technique and patient positioning. Aortic calcifications. No focal consolidation, pleural effusion, or pneumothorax. IMPRESSION: Interval placement of right IJ central venous catheter with the distal tip of the level of the mid SVC. Electronically Signed   By: Ileana Roup M.D.   On: 05/07/2022 14:45     ====================================================     For questions or updates, please contact [place] Please consult [email or web address] for contact info under        Signed, Glenetta Hew, MD  05/09/2022, 9:21 AM

## 2022-05-09 NOTE — Progress Notes (Signed)
   VASCULAR SURGERY ASSESSMENT & PLAN:   END-STAGE RENAL DISEASE: His duplex of his first stage left basilic vein transposition shows that the fistula is small and therefore he is not a candidate for a second stage basilic vein transposition.  This fistula has been in for 2 years.  He will need to be evaluated for new access.  He has diminished radial pulses and I have ordered upper extremity arterial duplex and vein mapping in the right arm to plan his next access option.  He feels strongly about having this done as an outpatient and given his multiple medical issues currently I think this is reasonable.  He is good to have a tunneled dialysis catheter placed tomorrow and then it would be safe for discharge from my standpoint.  Pending the results of his arterial duplex and vein mapping the right arm I will make recommendations concerning his next option for access.   SUBJECTIVE:   Wants to go home.  PHYSICAL EXAM:   Vitals:   05/08/22 2020 05/08/22 2351 05/09/22 0400 05/09/22 0647  BP:  (!) 149/71 (!) 154/63 (!) 166/75  Pulse:  73 78 80  Resp: '14 16 16   '$ Temp:  98.2 F (36.8 C) 98.2 F (36.8 C)   TempSrc:  Oral Oral   SpO2:  97% 94%   Weight:      Height:       Unable to palpate left radial pulse.  Diminished right radial pulse.  LABS:   Lab Results  Component Value Date   WBC 4.5 05/06/2022   HGB 8.7 (L) 05/06/2022   HCT 27.4 (L) 05/06/2022   MCV 97.2 05/06/2022   PLT 178 05/06/2022   PROBLEM LIST:    Principal Problem:   Chest pain Active Problems:   Essential hypertension   CKD (chronic kidney disease) stage 5, GFR less than 15 ml/min (HCC)   Symptomatic anemia   TB lung, latent   Metabolic acidosis   CKD (chronic kidney disease), stage V (HCC)   Acute ischemic stroke (HCC)   Acute congestive heart failure (HCC)   CURRENT MEDS:    (feeding supplement) PROSource Plus  30 mL Oral BID BM   sodium chloride   Intravenous Once   aspirin EC  81 mg Oral QPM    atorvastatin  40 mg Oral Daily   carvedilol  12.5 mg Oral BID WC   Chlorhexidine Gluconate Cloth  6 each Topical Daily   clopidogrel  75 mg Oral Daily   darbepoetin (ARANESP) injection - NON-DIALYSIS  60 mcg Subcutaneous Q Thu-1800   feeding supplement (NEPRO CARB STEADY)  237 mL Oral BID BM   hydrALAZINE  100 mg Oral TID   hydrOXYzine  10 mg Oral TID   isosorbide mononitrate  30 mg Oral Daily   loratadine  10 mg Oral Daily   multivitamin  1 tablet Oral QHS   pantoprazole  40 mg Oral QHS   sevelamer carbonate  800 mg Oral TID WC   sodium bicarbonate  650 mg Oral BID    [name] Office: [phone] 05/09/2022

## 2022-05-10 ENCOUNTER — Encounter (HOSPITAL_COMMUNITY): Payer: Self-pay | Admitting: Internal Medicine

## 2022-05-10 ENCOUNTER — Inpatient Hospital Stay (HOSPITAL_COMMUNITY): Payer: Medicaid Other

## 2022-05-10 DIAGNOSIS — N19 Unspecified kidney failure: Secondary | ICD-10-CM | POA: Diagnosis not present

## 2022-05-10 DIAGNOSIS — R079 Chest pain, unspecified: Secondary | ICD-10-CM | POA: Diagnosis not present

## 2022-05-10 DIAGNOSIS — I200 Unstable angina: Secondary | ICD-10-CM | POA: Diagnosis not present

## 2022-05-10 DIAGNOSIS — R072 Precordial pain: Secondary | ICD-10-CM | POA: Diagnosis not present

## 2022-05-10 DIAGNOSIS — I132 Hypertensive heart and chronic kidney disease with heart failure and with stage 5 chronic kidney disease, or end stage renal disease: Secondary | ICD-10-CM | POA: Diagnosis not present

## 2022-05-10 DIAGNOSIS — I5021 Acute systolic (congestive) heart failure: Secondary | ICD-10-CM | POA: Diagnosis not present

## 2022-05-10 DIAGNOSIS — D649 Anemia, unspecified: Secondary | ICD-10-CM | POA: Diagnosis not present

## 2022-05-10 DIAGNOSIS — R0602 Shortness of breath: Secondary | ICD-10-CM | POA: Diagnosis not present

## 2022-05-10 DIAGNOSIS — Z4901 Encounter for fitting and adjustment of extracorporeal dialysis catheter: Secondary | ICD-10-CM | POA: Diagnosis not present

## 2022-05-10 DIAGNOSIS — N185 Chronic kidney disease, stage 5: Secondary | ICD-10-CM | POA: Diagnosis not present

## 2022-05-10 DIAGNOSIS — N186 End stage renal disease: Secondary | ICD-10-CM | POA: Diagnosis not present

## 2022-05-10 DIAGNOSIS — E875 Hyperkalemia: Secondary | ICD-10-CM | POA: Diagnosis not present

## 2022-05-10 DIAGNOSIS — I5042 Chronic combined systolic (congestive) and diastolic (congestive) heart failure: Secondary | ICD-10-CM | POA: Diagnosis not present

## 2022-05-10 DIAGNOSIS — I5041 Acute combined systolic (congestive) and diastolic (congestive) heart failure: Secondary | ICD-10-CM | POA: Diagnosis not present

## 2022-05-10 HISTORY — PX: IR US GUIDE VASC ACCESS RIGHT: IMG2390

## 2022-05-10 HISTORY — PX: IR FLUORO GUIDE CV LINE RIGHT: IMG2283

## 2022-05-10 LAB — HEMOGLOBIN A1C
Hgb A1c MFr Bld: 5.1 % (ref 4.8–5.6)
Mean Plasma Glucose: 100 mg/dL

## 2022-05-10 LAB — CBC
HCT: 27.3 % — ABNORMAL LOW (ref 39.0–52.0)
Hemoglobin: 8.7 g/dL — ABNORMAL LOW (ref 13.0–17.0)
MCH: 31.4 pg (ref 26.0–34.0)
MCHC: 31.9 g/dL (ref 30.0–36.0)
MCV: 98.6 fL (ref 80.0–100.0)
Platelets: 202 10*3/uL (ref 150–400)
RBC: 2.77 MIL/uL — ABNORMAL LOW (ref 4.22–5.81)
RDW: 15.3 % (ref 11.5–15.5)
WBC: 5.7 10*3/uL (ref 4.0–10.5)
nRBC: 0 % (ref 0.0–0.2)

## 2022-05-10 LAB — RENAL FUNCTION PANEL
Albumin: 3 g/dL — ABNORMAL LOW (ref 3.5–5.0)
Anion gap: 8 (ref 5–15)
BUN: 44 mg/dL — ABNORMAL HIGH (ref 6–20)
CO2: 25 mmol/L (ref 22–32)
Calcium: 8.6 mg/dL — ABNORMAL LOW (ref 8.9–10.3)
Chloride: 107 mmol/L (ref 98–111)
Creatinine, Ser: 8.16 mg/dL — ABNORMAL HIGH (ref 0.61–1.24)
GFR, Estimated: 7 mL/min — ABNORMAL LOW (ref >60)
Glucose, Bld: 107 mg/dL — ABNORMAL HIGH (ref 70–99)
Phosphorus: 4.8 mg/dL — ABNORMAL HIGH (ref 2.5–4.6)
Potassium: 4.4 mmol/L (ref 3.5–5.1)
Sodium: 140 mmol/L (ref 135–145)

## 2022-05-10 MED ORDER — FENTANYL CITRATE (PF) 100 MCG/2ML IJ SOLN
INTRAMUSCULAR | Status: AC
  Filled 2022-05-10: qty 2

## 2022-05-10 MED ORDER — CHLORHEXIDINE GLUCONATE 4 % EX LIQD
CUTANEOUS | Status: AC
  Filled 2022-05-10: qty 15

## 2022-05-10 MED ORDER — MIDAZOLAM HCL 2 MG/2ML IJ SOLN
INTRAMUSCULAR | Status: AC
  Filled 2022-05-10: qty 2

## 2022-05-10 MED ORDER — MIDAZOLAM HCL 2 MG/2ML IJ SOLN
INTRAMUSCULAR | Status: AC | PRN
  Administered 2022-05-10 (×2): .5 mg via INTRAVENOUS

## 2022-05-10 MED ORDER — HEPARIN SODIUM (PORCINE) 1000 UNIT/ML DIALYSIS: 2000.0000 [IU] | INTRAMUSCULAR | Status: DC | PRN

## 2022-05-10 MED ORDER — CARVEDILOL 25 MG PO TABS
25.0000 mg | ORAL_TABLET | Freq: Two times a day (BID) | ORAL | Status: DC
  Administered 2022-05-10 – 2022-05-11 (×3): 25 mg via ORAL
  Filled 2022-05-10 (×3): qty 1

## 2022-05-10 MED ORDER — CEFAZOLIN SODIUM-DEXTROSE 2-4 GM/100ML-% IV SOLN
INTRAVENOUS | Status: AC
  Filled 2022-05-10: qty 100

## 2022-05-10 MED ORDER — HEPARIN SODIUM (PORCINE) 1000 UNIT/ML IJ SOLN
INTRAMUSCULAR | Status: AC
  Administered 2022-05-10: 3200 [IU]
  Filled 2022-05-10: qty 4

## 2022-05-10 MED ORDER — FENTANYL CITRATE (PF) 100 MCG/2ML IJ SOLN
INTRAMUSCULAR | Status: AC | PRN
  Administered 2022-05-10: 25 ug via INTRAVENOUS

## 2022-05-10 MED ORDER — CEFAZOLIN SODIUM-DEXTROSE 2-4 GM/100ML-% IV SOLN
INTRAVENOUS | Status: AC | PRN
  Administered 2022-05-10: 2 g via INTRAVENOUS

## 2022-05-10 MED ORDER — HEPARIN SODIUM (PORCINE) 1000 UNIT/ML IJ SOLN
INTRAMUSCULAR | Status: AC
  Administered 2022-05-10: 3200 [IU] via INTRAVENOUS_CENTRAL
  Filled 2022-05-10: qty 10

## 2022-05-10 MED ORDER — HEPARIN SODIUM (PORCINE) 1000 UNIT/ML IJ SOLN
INTRAMUSCULAR | Status: AC
  Administered 2022-05-10: 2000 [IU]
  Filled 2022-05-10: qty 2

## 2022-05-10 MED ORDER — LIDOCAINE-EPINEPHRINE 1 %-1:100000 IJ SOLN
INTRAMUSCULAR | Status: AC
  Administered 2022-05-10: 10 mL via INTRADERMAL
  Filled 2022-05-10: qty 1

## 2022-05-10 NOTE — Progress Notes (Signed)
PROGRESS NOTE    Cody Webster  QIW:[id number] DOB: Nov 10, 1965 DOA: 05/04/2022 PCP: Kerin Perna, NP   Brief Narrative:  Cody Webster is a 56 y.o. male with medical history significant of CKD5, HTN, GERD, latent TB. Presented with chest pain. He reports that he was on medication for latent TB, but he stopped because he thought it was making him sick. No other complaint.  Admitted to hospital service at [facility].  Cardiology and nephrology consulted.  Nephrology recommended hemodialysis, he has functional LUE AVF but patient refused dialysis in the beginning but eventually agreed and he was transferred to [facility] for that reason.  Cardiology also followed him.  They are recommending cardiac cath at some point in time during this hospitalization.  On the evening of 05/05/2022, patient was noted to have dysarthria and facial droop and code stroke was called.  MRI shows acute infarct in right posterior pons and subcortical left cerebral hemisphere.  Neurology consulted.  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  Echo shows slightly reduced LVEF, previously it was 50 to 55% and now it is 45 to 50%.  Also has regional wall motion from release with hypokinesis.  Grade 2 diastolic dysfunction.  Cardiology on board and initially plans were to do cardiac catheterization during hospitalization however now cardiology has decided to defer this to  outpatient.  They recommend treating medically for now.  CKD stage V/hyperphosphatemia/hyperkalemia/metabolic acidosis/uremia: Nephrology on board, patient has nonfunctioning LUE AVF.  Patient underwent temporary right IJ HD catheter placed by PCCM on 05/07/2022 and he was started on hemodialysis . Per vascular surgery , His duplex of his first stage left basilic vein transposition shows that the fistula is small and therefore he is not a candidate for a second stage basilic vein transposition. This fistula has been in for 2 years. He will need to be evaluated for new access.  They are planning to do this as outpatient, IR is consulted for tunneled catheter tomorrow.  Nephrology on board for dialysis and he is due for dialysis today.  He is also scheduled for tunneled catheter placement by IR today.   Acute ischemic stroke/essential hypertension: MRI report shows acute ischemic stroke.  He is only on aspirin and now also on Plavix.  He is on atorvastatin as well. Neurology recommends 30-day CardioNet monitor as outpatient to rule out A-fib.  Blood pressure still elevated, he is on hydralazine 100 mg 3 times daily, Imdur 30 mg p.o. daily and Coreg 12.5 mg p.o. twice daily, cardiology has increased both Imdur to 60 mg p.o. daily and Coreg to 25 mg p.o. twice daily.    Anemia of chronic disease: Patient's hemoglobin dropped to 7 on 05/04/2022 and he received 1 unit of PRBC transfusion.  Hemoglobin improved.  Latent TB: Patient stopped taking medications and he is refusing again.  He has the capacity to make decisions for himself.  DVT prophylaxis:  SCDs Start: 05/04/22 2227   Code Status: DNR  Family Communication:  None present at bedside.  Plan of care discussed with patient in length and he/she verbalized understanding and agreed with it.  Status is: Inpatient Remains inpatient appropriate because: Plan for permanent catheter today.  Will need CLIP process before discharge.   Estimated body mass  index is 21.84 kg/m as calculated from the following:   Height as of this encounter: '6\' 1"'$  (1.854 m).   Weight as of this encounter: 75.1 kg.    Nutritional Assessment: Body mass index is 21.84 kg/m.Marland Kitchen Seen by dietician.  I agree with the assessment and plan as outlined below: Nutrition Status: Nutrition Problem: Increased nutrient needs Etiology: acute illness, chronic illness Signs/Symptoms: estimated needs Interventions: Nepro shake, Prostat  . Skin Assessment: I have examined the patient's skin and I agree with the wound assessment as performed by the wound care RN as outlined below:    Consultants:  Nephrology Cardiology Neurology   Procedures:  As above  Antimicrobials:  Anti-infectives (From admission, onward)    None         Subjective:  Patient seen and examined.  No complaints.  Objective: Vitals:   05/09/22 2300 05/10/22 0311 05/10/22 0817 05/10/22 0818  BP: (!) 161/72 (!) 173/81  (!) 188/88  Pulse:      Resp:  15  13  Temp: 97.6 F (36.4 C) 97.7 F (36.5 C) (P) 97.8 F (36.6 C) 97.8 F (36.6 C)  TempSrc: Oral Oral (P) Oral Oral  SpO2:    97%  Weight:      Height:        Intake/Output Summary (Last 24 hours) at 05/10/2022 1012 Last data filed at 05/10/2022 0700 Gross per 24 hour  Intake 120 ml  Output 750 ml  Net -630 ml    Filed Weights   05/05/22 2300 05/08/22 0130 05/08/22 0546  Weight: 77.5 kg 76.6 kg 75.1 kg    Examination:  General exam: Appears calm and comfortable  Respiratory system: Clear to auscultation. Respiratory effort normal. Cardiovascular system: S1 & S2 heard, RRR. No JVD, murmurs, rubs, gallops or clicks. No pedal edema. Gastrointestinal system: Abdomen is nondistended, soft and nontender. No organomegaly or masses felt. Normal bowel sounds heard. Central nervous system: Alert and oriented. No focal neurological deficits. Extremities: Symmetric 5 x 5 power. Skin: No rashes, lesions or ulcers.  Psychiatry:  Judgement and insight appear normal. Mood & affect appropriate.   Data Reviewed: I have personally reviewed following labs and imaging studies  CBC: Recent Labs  Lab 05/04/22 1319 05/05/22 0025 05/06/22 0026 05/10/22 0037  WBC 4.2 4.2 4.5 5.7  HGB 7.0* 7.3* 8.7* 8.7*  HCT 22.8* 23.2* 27.4* 27.3*  MCV 101.8* 100.4* 97.2 98.6  PLT 187 191 178 202    Basic Metabolic Panel: Recent Labs  Lab 05/06/22 0026 05/07/22 0029 05/08/22 0548 05/09/22 0032 05/10/22 0037  NA 138 139 138 139 140  K 4.8 4.8 3.7 4.3 4.4  CL 110 111 103 104 107  CO2 18* 17* '24 24 25  '$ GLUCOSE 88 96 74 101* 107*  BUN 59* 57* 31* 42* 44*  CREATININE 10.19* 10.45* 6.63* 7.75* 8.16*  CALCIUM 8.1* 8.5* 8.5* 8.3* 8.6*  PHOS 5.7* 5.6* 3.0 4.1 4.8*    GFR: Estimated Creatinine Clearance: 10.5 mL/min (A) (by C-G formula based on SCr of 8.16 mg/dL (H)). Liver Function Tests: Recent Labs  Lab 05/06/22 0026 05/07/22 0029 05/08/22 5427 05/09/22 0032 05/10/22 0037  ALBUMIN 3.0* 3.4* 3.0* 2.8* 3.0*    No results for input(s): "LIPASE", "AMYLASE" in the last 168 hours. No results for input(s): "AMMONIA" in the last 168 hours. Coagulation Profile: No results for input(s): "INR", "PROTIME" in the last 168 hours. Cardiac Enzymes: No results for input(s): "CKTOTAL", "CKMB", "CKMBINDEX", "TROPONINI" in the last 168 hours. BNP (last 3 results) No results for input(s): "PROBNP" in the last 8760 hours. HbA1C: No results for input(s): "HGBA1C" in the last 72 hours. CBG: Recent Labs  Lab 05/05/22 2138  GLUCAP 105*    Lipid Profile: No results for input(s): "CHOL", "HDL", "LDLCALC", "TRIG", "CHOLHDL", "LDLDIRECT" in the last 72 hours.  Thyroid Function Tests: No results for input(s): "TSH", "T4TOTAL", "FREET4", "T3FREE", "THYROIDAB" in the last 72 hours. Anemia Panel: No results for input(s): "VITAMINB12", "FOLATE", "FERRITIN", "TIBC", "IRON", "RETICCTPCT" in the last 72 hours.  Sepsis Labs: No results  for input(s): "PROCALCITON", "LATICACIDVEN" in the last 168 hours.  Recent Results (from the past 240 hour(s))  Resp panel by RT-PCR (RSV, Flu A&B, Covid) Anterior Nasal Swab     Status: None   Collection Time: 05/04/22  1:02 PM   Specimen: Anterior Nasal Swab  Result Value Ref Range Status   SARS Coronavirus 2 by RT PCR NEGATIVE NEGATIVE Final    Comment: (NOTE) SARS-CoV-2 target nucleic acids are NOT DETECTED.  The SARS-CoV-2 RNA is generally detectable in upper respiratory specimens during the acute phase of infection. The lowest concentration of SARS-CoV-2 viral copies this assay can detect is 138 copies/mL. A negative result does not preclude SARS-Cov-2 infection and should not be used as the sole basis for treatment or other patient management decisions. A negative result may occur with  improper specimen collection/handling, submission of specimen other than nasopharyngeal swab, presence of viral mutation(s) within the areas targeted by this assay, and inadequate number of viral copies(<138 copies/mL). A negative result must be combined with clinical observations, patient history, and epidemiological information. The expected result is Negative.  Fact Sheet for Patients:  EntrepreneurPulse.com.au  Fact Sheet for Healthcare Providers:  IncredibleEmployment.be  This test is no t yet approved or cleared by the [place] FDA and  has been authorized for detection and/or diagnosis of SARS-CoV-2 by FDA under an Emergency Use Authorization (EUA). This EUA will remain  in effect (meaning this test can be used) for the duration of the COVID-19 declaration under Section 564(b)(1) of the Act, 21 U.S.C.section 360bbb-3(b)(1), unless the authorization is terminated  or revoked sooner.       Influenza A by PCR NEGATIVE NEGATIVE Final   Influenza B by PCR NEGATIVE NEGATIVE Final    Comment: (NOTE) The Xpert Xpress SARS-CoV-2/FLU/RSV plus assay  is intended as an aid in the diagnosis of influenza from Nasopharyngeal swab specimens and should not be used as a sole basis for treatment. Nasal washings and aspirates are unacceptable for Xpert Xpress SARS-CoV-2/FLU/RSV testing.  Fact Sheet for Patients: EntrepreneurPulse.com.au  Fact Sheet for Healthcare Providers: IncredibleEmployment.be  This test is not yet approved or cleared by the [place] FDA and has been authorized for detection and/or diagnosis of SARS-CoV-2 by FDA under an Emergency Use Authorization (EUA). This EUA will remain in effect (meaning this test can be used) for the duration of the COVID-19 declaration under Section 564(b)(1) of the Act, 21 U.S.C. section 360bbb-3(b)(1), unless the authorization is terminated or revoked.     Resp Syncytial Virus by PCR NEGATIVE NEGATIVE Final    Comment: (NOTE) Fact Sheet for Patients:  EntrepreneurPulse.com.au  Fact Sheet for Healthcare Providers: IncredibleEmployment.be  This test is not yet approved or cleared by the [place] FDA and has been authorized for detection and/or diagnosis of SARS-CoV-2 by FDA under an Emergency Use Authorization (EUA). This EUA will remain in effect (meaning this test can be used) for the duration of the COVID-19 declaration under Section 564(b)(1) of the Act, 21 U.S.C. section 360bbb-3(b)(1), unless the authorization is terminated or revoked.  Performed at [facility], [place] [address] [place], [place] [address]   MRSA Next Gen by PCR, Nasal     Status: None   Collection Time: 05/05/22 11:41 PM   Specimen: Nasal Mucosa; Nasal Swab  Result Value Ref Range Status   MRSA by PCR Next Gen NOT DETECTED NOT DETECTED Final    Comment: (NOTE) The GeneXpert MRSA Assay (FDA approved for NASAL specimens only), is one component of a comprehensive MRSA colonization surveillance program. It is not  intended to diagnose MRSA infection nor to guide or monitor treatment for MRSA infections. Test performance is not FDA approved in patients less than 53 years old. Performed at [facility], [place] [address], [place], [place] [address]      Radiology Studies: VAS [place] UPPER EXTREMITY ARTERIAL DUPLEX  Result Date: 05/10/2022  UPPER EXTREMITY DUPLEX STUDY Patient Name:  QUEST TAVENNER  Date of Exam:   05/09/2022 Medical Rec #: [id number]      Accession #:    [id number] Date of Birth: 07/31/1965      Patient Gender: M Patient Age:   32 years Exam Location:  [facility] Procedure:      VAS [place] UPPER EXTREMITY ARTERIAL DUPLEX Referring Phys: Harrell Gave DICKSON --------------------------------------------------------------------------------  Indications: Diminished radial pulses on clinical exam, patient with first stage              left basilic vein transposition.  Performing Technologist: Darlin Coco RDMS, RVT  Examination Guidelines: A complete evaluation includes B-mode imaging, spectral Doppler, color Doppler, and power Doppler as needed of all accessible portions of each vessel. Bilateral testing is considered an integral part of a complete examination. Limited examinations for reoccurring indications may be performed as noted.  Right Doppler Findings: +---------------+----------+---------+--------+--------+ Site           PSV (cm/s)Waveform StenosisComments +---------------+----------+---------+--------+--------+ Subclavian Prox148       triphasic                 +---------------+----------+---------+--------+--------+ Subclavian Dist133       triphasic                 +---------------+----------+---------+--------+--------+ Axillary       169       triphasic                 +---------------+----------+---------+--------+--------+ Brachial Prox  160       triphasic                 +---------------+----------+---------+--------+--------+ Brachial Mid   117        triphasic                 +---------------+----------+---------+--------+--------+ Brachial Dist  116       triphasic                 +---------------+----------+---------+--------+--------+ Radial Prox    107       triphasic                 +---------------+----------+---------+--------+--------+ Radial Mid  100       triphasic                 +---------------+----------+---------+--------+--------+ Radial Dist    143       triphasic                 +---------------+----------+---------+--------+--------+ Ulnar Prox     100       triphasic                 +---------------+----------+---------+--------+--------+ Ulnar Mid      88        triphasic                 +---------------+----------+---------+--------+--------+ Ulnar Dist     107       triphasic                 +---------------+----------+---------+--------+--------+ Palmar Arch    131       triphasic                 +---------------+----------+---------+--------+--------+   Left Doppler Findings: +---------------+----------+---------+-------------+---------------------------+ Site           PSV (cm/s)Waveform Stenosis     Comments                    +---------------+----------+---------+-------------+---------------------------+ Subclavian Prox362                >50% stenosisHighly turbulent, AVF                                                      waveform, ratio >2.0 as                                                    compared to distal          +---------------+----------+---------+-------------+---------------------------+ Subclavian Mid 265                             Turbulent, AVF waveform     +---------------+----------+---------+-------------+---------------------------+ Subclavian Dist176                             Turbulet, AVF waveform,                                                    Dampened WF                  +---------------+----------+---------+-------------+---------------------------+ Axillary       203                             AVF waveform                +---------------+----------+---------+-------------+---------------------------+ Brachial Prox  219                             AVF waveform                +---------------+----------+---------+-------------+---------------------------+  Brachial Mid   244                             AVF waveform                +---------------+----------+---------+-------------+---------------------------+ Brachial Dist  51        biphasic                                          +---------------+----------+---------+-------------+---------------------------+ Radial Prox    50        biphasic                                          +---------------+----------+---------+-------------+---------------------------+ Radial Mid     70        biphasic                                          +---------------+----------+---------+-------------+---------------------------+ Radial Dist    83        triphasic                                         +---------------+----------+---------+-------------+---------------------------+ Ulnar Prox     63        biphasic                                          +---------------+----------+---------+-------------+---------------------------+ Ulnar Mid      83        biphasic                                          +---------------+----------+---------+-------------+---------------------------+ Ulnar Dist     51        biphasic                                          +---------------+----------+---------+-------------+---------------------------+   Summary:  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 *See table(s) above for measurements and observations. Electronically signed by [name] MD on 05/10/2022 at 4:34:01 AM.     Final    VAS [place] UPPER EXT VEIN MAPPING (PRE-OP AVF)  Result Date: 05/10/2022 UPPER EXTREMITY VEIN MAPPING Patient Name:  TYREON FRIGON  Date of Exam:   05/09/2022 Medical Rec #: [id number]      Accession #:    [id number] Date of Birth: April 09, 1966      Patient Gender: M Patient Age:   31 years Exam Location:  [facility] Procedure:      VAS [place] UPPER EXT VEIN MAPPING (PRE-OP AVF) Referring Phys: Harrell Gave DICKSON --------------------------------------------------------------------------------  Indications: Pre-access. History: ESRD, history of LEFT basilic vein AVF creation 03-23-2020.  Comparison Study: 03-10-2020 Prior upper  extremity vein mapping. Performing Technologist: Darlin Coco RDMS, RVT  Examination Guidelines: A complete evaluation includes B-mode imaging, spectral Doppler, color Doppler, and power Doppler as needed of all accessible portions of each vessel. Bilateral testing is considered an integral part of a complete examination. Limited examinations for reoccurring indications may be performed as noted. +-----------------+-------------+----------+---------+ Right Cephalic   Diameter (cm)Depth (cm)Findings  +-----------------+-------------+----------+---------+ Shoulder             0.28        0.27             +-----------------+-------------+----------+---------+ Prox upper arm       0.30        0.23             +-----------------+-------------+----------+---------+ Mid upper arm        0.24        0.27             +-----------------+-------------+----------+---------+ Dist upper arm       0.25        0.17             +-----------------+-------------+----------+---------+ Antecubital fossa    0.31        0.20   branching +-----------------+-------------+----------+---------+ Prox forearm         0.21        0.20             +-----------------+-------------+----------+---------+ Mid forearm          0.15        0.21   branching  +-----------------+-------------+----------+---------+ Dist forearm         0.13        0.14             +-----------------+-------------+----------+---------+ +-----------------+-------------+----------+---------+ Right Basilic    Diameter (cm)Depth (cm)Findings  +-----------------+-------------+----------+---------+ Prox upper arm       0.32                         +-----------------+-------------+----------+---------+ Mid upper arm        0.37                         +-----------------+-------------+----------+---------+ Dist upper arm       0.32               branching +-----------------+-------------+----------+---------+ Antecubital fossa    0.30                         +-----------------+-------------+----------+---------+ Prox forearm         0.23                         +-----------------+-------------+----------+---------+ Mid forearm          0.25                         +-----------------+-------------+----------+---------+ Distal forearm       0.22               branching +-----------------+-------------+----------+---------+ *See table(s) above for measurements and observations.  Diagnosing physician: [name] MD Electronically signed by [name] MD on 05/10/2022 at 4:32:09 AM.    Final    MR ANGIO HEAD WO CONTRAST  Result Date: 05/08/2022 CLINICAL DATA:  Follow-up examination for stroke. EXAM: MRA HEAD WITHOUT CONTRAST TECHNIQUE: Angiographic images of the Circle of Willis were acquired  using MRA technique without intravenous contrast. COMPARISON:  Comparison made with prior brain MRI from 05/06/2022. FINDINGS: Anterior circulation: Examination moderately degraded by motion artifact, limiting assessment. Visualized distal cervical segments of both internal carotid arteries are widely patent with antegrade flow. Petrous, cavernous, and supraclinoid segments patent without significant stenosis or other visible abnormality. A1  segments grossly patent bilaterally. Left A1 dominant, with a hypoplastic right A1. Grossly normal anterior communicating artery complex. Anterior cerebral arteries patent to their distal aspects without convincing stenosis. Both M1 segments are widely patent. No visible proximal MCA branch occlusion or convincing stenosis. Distal MCA branches perfused and fairly symmetric. Posterior circulation: Both V4 segments patent without stenosis. Right vertebral artery dominant. Left PICA patent. Right PICA not seen. Basilar patent without stenosis. Superior cerebral arteries patent bilaterally. Both PCA supplied via the basilar as well as prominent bilateral posterior communicating arteries. PCAs patent to their distal aspects without visible stenosis. Anatomic variants: None significant. Other: No visible intracranial aneurysm. IMPRESSION: 1. Motion degraded exam. 2. Negative intracranial MRA. No large vessel occlusion. No visible hemodynamically significant or correctable stenosis. Electronically Signed   By: Jeannine Boga M.D.   On: 05/08/2022 22:14   VAS US DUPLEX DIALYSIS ACCESS (AVF, AVG)  Result Date: 05/08/2022 DIALYSIS ACCESS Patient Name:  KAIREE ISA  Date of Exam:   05/08/2022 Medical Rec #: [id number]      Accession #:    [id number] Date of Birth: 1965/12/07      Patient Gender: M Patient Age:   4 years Exam Location:  [facility] Procedure:      VAS US DUPLEX DIALYSIS ACCESS (AVF, AVG) Referring Phys: Harrell Gave DICKSON --------------------------------------------------------------------------------  Reason for Exam: Evaluation prior to placement of dialysis access. Access Site: Left Upper Extremity. Access Type: Basilic vein transposition. History: 86/09/7844 - LEFT BASILIC VEIN FISTULA CREATION. Comparison Study: No prior studies. Performing Technologist: Oliver Hum RVT  Examination Guidelines: A complete evaluation includes B-mode imaging, spectral Doppler, color Doppler, and power  Doppler as needed of all accessible portions of each vessel. Unilateral testing is considered an integral part of a complete examination. Limited examinations for reoccurring indications may be performed as noted.  Findings:  +------------+----------+-------------+----------+--------+ OUTFLOW VEINPSV (cm/s)Diameter (cm)Depth (cm)Describe +------------+----------+-------------+----------+--------+ Prox UA                   0.48                        +------------+----------+-------------+----------+--------+ Mid UA                    0.45                        +------------+----------+-------------+----------+--------+ Dist UA                   0.68                Branch  +------------+----------+-------------+----------+--------+ An area of stenosis is noted in the distal segment of the basilic vein near the anastimosis. Pre 0.76 cm, at 0.37 cm, post 0.72 cm.  Diagnosing physician: [name] MD Electronically signed by [name] MD on 05/08/2022 at 6:19:24 PM.    --------------------------------------------------------------------------------   Final     Scheduled Meds:  (feeding supplement) PROSource Plus  30 mL Oral BID BM   amLODipine  5 mg Oral Daily   aspirin EC  81 mg Oral QPM   atorvastatin  40 mg Oral Daily   carvedilol  25 mg Oral BID WC   Chlorhexidine Gluconate Cloth  6 each Topical Daily   Chlorhexidine Gluconate Cloth  6 each Topical Q0600   clopidogrel  75 mg Oral Daily   darbepoetin (ARANESP) injection - NON-DIALYSIS  60 mcg Subcutaneous Q Thu-1800   feeding supplement (NEPRO CARB STEADY)  237 mL Oral BID BM   hydrALAZINE  100 mg Oral TID   hydrOXYzine  10 mg Oral TID   isosorbide mononitrate  60 mg Oral Daily   loratadine  10 mg Oral Daily   multivitamin  1 tablet Oral QHS   nicotine  14 mg Transdermal Daily   pantoprazole  40 mg Oral QHS   sevelamer carbonate  800 mg Oral TID WC   sodium bicarbonate  650 mg Oral BID   Continuous  Infusions:     LOS: 5 days   Darliss Cheney, MD [facility]  05/10/2022, 10:12 AM   *Please note that this is a verbal dictation therefore any spelling or grammatical errors are due to the "[employer] One" system interpretation.  Please page via [place] and do not message via secure chat for urgent patient care matters. Secure chat can be used for non urgent patient care matters.  How to contact the [facility] Attending or Consulting provider [place] or covering provider during after hours [place], for this patient?  Check the care team in [facility] and look for a) attending/consulting TRH provider listed and b) the [facility] team listed. Page or secure chat [phone]. Log into [email or web address] and use [place]'s universal password to access. If you do not have the password, please contact the hospital operator. Locate the [facility] provider you are looking for under [facility] and page to a number that you can be directly reached. If you still have difficulty reaching the provider, please page the [facility] (Director on Call) for the Hospitalists listed on amion for assistance.

## 2022-05-10 NOTE — Procedures (Signed)
  Procedure:  R IJ tunneled HD CVC placement Palindrome 19 Preprocedure diagnosis: The primary encounter diagnosis was Nonspecific chest pain. Diagnoses of Acute congestive heart failure, unspecified heart failure type ([place]) and Acute ischemic stroke [facility]) were also pertinent to this visit.  Postprocedure diagnosis: same EBL:    minimal Complications:   none immediate  See full dictation in [employer].  Dillard Cannon MD Main # [phone] Pager  [phone] Mobile [phone]

## 2022-05-10 NOTE — Sedation Documentation (Signed)
Pt states itching, but states it is not new itching.

## 2022-05-10 NOTE — Progress Notes (Signed)
Cardiology Progress Note  Patient ID: Lui Bellis MRN: [id number] DOB: 04/05/1966 Date of Encounter: 05/10/2022  Primary Cardiologist: Glenetta Hew, MD  Subjective   Chief Complaint: None.   HPI: Plan for tunneled dialysis catheter today.  Denies chest pain or trouble breathing.  Plan is for outpatient ischemia evaluation given recent stroke and new dialysis start.  ROS:  All other ROS reviewed and negative. Pertinent positives noted in the HPI.     Inpatient Medications  Scheduled Meds:  (feeding supplement) PROSource Plus  30 mL Oral BID BM   amLODipine  5 mg Oral Daily   aspirin EC  81 mg Oral QPM   atorvastatin  40 mg Oral Daily   carvedilol  12.5 mg Oral BID WC   Chlorhexidine Gluconate Cloth  6 each Topical Daily   Chlorhexidine Gluconate Cloth  6 each Topical Q0600   clopidogrel  75 mg Oral Daily   darbepoetin (ARANESP) injection - NON-DIALYSIS  60 mcg Subcutaneous Q Thu-1800   feeding supplement (NEPRO CARB STEADY)  237 mL Oral BID BM   hydrALAZINE  100 mg Oral TID   hydrOXYzine  10 mg Oral TID   isosorbide mononitrate  60 mg Oral Daily   loratadine  10 mg Oral Daily   multivitamin  1 tablet Oral QHS   nicotine  14 mg Transdermal Daily   pantoprazole  40 mg Oral QHS   sevelamer carbonate  800 mg Oral TID WC   sodium bicarbonate  650 mg Oral BID   Continuous Infusions:  PRN Meds: acetaminophen, camphor-menthol, diphenhydrAMINE, fluticasone, heparin, hydrALAZINE, HYDROmorphone (DILAUDID) injection, labetalol, nitroGLYCERIN, oxyCODONE-acetaminophen **AND** oxyCODONE, prochlorperazine, traZODone   Vital Signs   Vitals:   05/09/22 2300 05/10/22 0311 05/10/22 0817 05/10/22 0818  BP: (!) 161/72 (!) 173/81  (!) 188/88  Pulse:      Resp:  15  13  Temp: 97.6 F (36.4 C) 97.7 F (36.5 C) (P) 97.8 F (36.6 C) 97.8 F (36.6 C)  TempSrc: Oral Oral (P) Oral Oral  SpO2:    97%  Weight:      Height:        Intake/Output Summary (Last 24 hours) at 05/10/2022  0849 Last data filed at 05/10/2022 0700 Gross per 24 hour  Intake 120 ml  Output 750 ml  Net -630 ml      05/08/2022    5:46 AM 05/08/2022    1:30 AM 05/05/2022   11:00 PM  Last 3 Weights  Weight (lbs) 165 lb 9.1 oz 168 lb 14 oz 170 lb 13.7 oz  Weight (kg) 75.1 kg 76.6 kg 77.5 kg      Telemetry  Overnight telemetry shows SR 80s, which I personally reviewed.    Physical Exam   Vitals:   05/09/22 2300 05/10/22 0311 05/10/22 0817 05/10/22 0818  BP: (!) 161/72 (!) 173/81  (!) 188/88  Pulse:      Resp:  15  13  Temp: 97.6 F (36.4 C) 97.7 F (36.5 C) (P) 97.8 F (36.6 C) 97.8 F (36.6 C)  TempSrc: Oral Oral (P) Oral Oral  SpO2:    97%  Weight:      Height:        Intake/Output Summary (Last 24 hours) at 05/10/2022 0849 Last data filed at 05/10/2022 0700 Gross per 24 hour  Intake 120 ml  Output 750 ml  Net -630 ml       05/08/2022    5:46 AM 05/08/2022    1:30 AM 05/05/2022  11:00 PM  Last 3 Weights  Weight (lbs) 165 lb 9.1 oz 168 lb 14 oz 170 lb 13.7 oz  Weight (kg) 75.1 kg 76.6 kg 77.5 kg    Body mass index is 21.84 kg/m.  General: Well nourished, well developed, in no acute distress Head: Atraumatic, normal size  Eyes: PEERLA, EOMI  Neck: Supple, no JVD Endocrine: No thryomegaly Cardiac: Normal S1, S2; RRR; no murmurs, rubs, or gallops Lungs: Clear to auscultation bilaterally, no wheezing, rhonchi or rales  Abd: Soft, nontender, no hepatomegaly  Ext: No edema, pulses 2+ Musculoskeletal: No deformities, BUE and BLE strength normal and equal Skin: Warm and dry, no rashes   Neuro: Alert and oriented to person, place, time, and situation, CNII-XII grossly intact, no focal deficits  Psych: Normal mood and affect   Labs  High Sensitivity Troponin:   Recent Labs  Lab 05/04/22 1319 05/04/22 1511 05/04/22 2251 05/05/22 0025  TROPONINIHS 221* 220* 190* 246*     Cardiac EnzymesNo results for input(s): "TROPONINI" in the last 168 hours. No results for  input(s): "TROPIPOC" in the last 168 hours.  Chemistry Recent Labs  Lab 05/08/22 0548 05/09/22 0032 05/10/22 0037  NA 138 139 140  K 3.7 4.3 4.4  CL 103 104 107  CO2 '24 24 25  '$ GLUCOSE 74 101* 107*  BUN 31* 42* 44*  CREATININE 6.63* 7.75* 8.16*  CALCIUM 8.5* 8.3* 8.6*  ALBUMIN 3.0* 2.8* 3.0*  GFRNONAA 9* 7* 7*  ANIONGAP '11 11 8    '$ Hematology Recent Labs  Lab 05/05/22 0025 05/06/22 0026 05/10/22 0037  WBC 4.2 4.5 5.7  RBC 2.31* 2.82* 2.77*  HGB 7.3* 8.7* 8.7*  HCT 23.2* 27.4* 27.3*  MCV 100.4* 97.2 98.6  MCH 31.6 30.9 31.4  MCHC 31.5 31.8 31.9  RDW 15.0 16.1* 15.3  PLT 191 178 202   BNP Recent Labs  Lab 05/04/22 1308  BNP 2,226.4*    DDimer No results for input(s): "DDIMER" in the last 168 hours.   Radiology  VAS [place] UPPER EXTREMITY ARTERIAL DUPLEX  Result Date: 05/10/2022  UPPER EXTREMITY DUPLEX STUDY Patient Name:  RIAN BUSCHE  Date of Exam:   05/09/2022 Medical Rec #: [id number]      Accession #:    [id number] Date of Birth: 1965-07-26      Patient Gender: M Patient Age:   56 years Exam Location:  [facility] Procedure:      VAS [place] UPPER EXTREMITY ARTERIAL DUPLEX Referring Phys: Harrell Gave DICKSON --------------------------------------------------------------------------------  Indications: Diminished radial pulses on clinical exam, patient with first stage              left basilic vein transposition.  Performing Technologist: Darlin Coco RDMS, RVT  Examination Guidelines: A complete evaluation includes B-mode imaging, spectral Doppler, color Doppler, and power Doppler as needed of all accessible portions of each vessel. Bilateral testing is considered an integral part of a complete examination. Limited examinations for reoccurring indications may be performed as noted.  Right Doppler Findings: +---------------+----------+---------+--------+--------+ Site           PSV (cm/s)Waveform StenosisComments  +---------------+----------+---------+--------+--------+ Subclavian Prox148       triphasic                 +---------------+----------+---------+--------+--------+ Subclavian Dist133       triphasic                 +---------------+----------+---------+--------+--------+ Axillary       169       triphasic                 +---------------+----------+---------+--------+--------+  Brachial Prox  160       triphasic                 +---------------+----------+---------+--------+--------+ Brachial Mid   117       triphasic                 +---------------+----------+---------+--------+--------+ Brachial Dist  116       triphasic                 +---------------+----------+---------+--------+--------+ Radial Prox    107       triphasic                 +---------------+----------+---------+--------+--------+ Radial Mid     100       triphasic                 +---------------+----------+---------+--------+--------+ Radial Dist    143       triphasic                 +---------------+----------+---------+--------+--------+ Ulnar Prox     100       triphasic                 +---------------+----------+---------+--------+--------+ Ulnar Mid      88        triphasic                 +---------------+----------+---------+--------+--------+ Ulnar Dist     107       triphasic                 +---------------+----------+---------+--------+--------+ Palmar Arch    131       triphasic                 +---------------+----------+---------+--------+--------+   Left Doppler Findings: +---------------+----------+---------+-------------+---------------------------+ Site           PSV (cm/s)Waveform Stenosis     Comments                    +---------------+----------+---------+-------------+---------------------------+ Subclavian Prox362                >50% stenosisHighly turbulent, AVF                                                      waveform,  ratio >2.0 as                                                    compared to distal          +---------------+----------+---------+-------------+---------------------------+ Subclavian Mid 265                             Turbulent, AVF waveform     +---------------+----------+---------+-------------+---------------------------+ Subclavian Dist176                             Turbulet, AVF waveform,  Dampened WF                 +---------------+----------+---------+-------------+---------------------------+ Axillary       203                             AVF waveform                +---------------+----------+---------+-------------+---------------------------+ Brachial Prox  219                             AVF waveform                +---------------+----------+---------+-------------+---------------------------+ Brachial Mid   244                             AVF waveform                +---------------+----------+---------+-------------+---------------------------+ Brachial Dist  51        biphasic                                          +---------------+----------+---------+-------------+---------------------------+ Radial Prox    50        biphasic                                          +---------------+----------+---------+-------------+---------------------------+ Radial Mid     70        biphasic                                          +---------------+----------+---------+-------------+---------------------------+ Radial Dist    83        triphasic                                         +---------------+----------+---------+-------------+---------------------------+ Ulnar Prox     63        biphasic                                          +---------------+----------+---------+-------------+---------------------------+ Ulnar Mid      83        biphasic                                           +---------------+----------+---------+-------------+---------------------------+ Ulnar Dist     51        biphasic                                          +---------------+----------+---------+-------------+---------------------------+   Summary:  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 *See table(s) above for measurements and observations.  Electronically signed by [name] MD on 05/10/2022 at 4:34:01 AM.    Final    VAS [place] UPPER EXT VEIN MAPPING (PRE-OP AVF)  Result Date: 05/10/2022 UPPER EXTREMITY VEIN MAPPING Patient Name:  PATTRICK BADY  Date of Exam:   05/09/2022 Medical Rec #: [id number]      Accession #:    [id number] Date of Birth: 05/12/1966      Patient Gender: M Patient Age:   49 years Exam Location:  [facility] Procedure:      VAS [place] UPPER EXT VEIN MAPPING (PRE-OP AVF) Referring Phys: Harrell Gave DICKSON --------------------------------------------------------------------------------  Indications: Pre-access. History: ESRD, history of LEFT basilic vein AVF creation 03-23-2020.  Comparison Study: 03-10-2020 Prior upper extremity vein mapping. Performing Technologist: Darlin Coco RDMS, RVT  Examination Guidelines: A complete evaluation includes B-mode imaging, spectral Doppler, color Doppler, and power Doppler as needed of all accessible portions of each vessel. Bilateral testing is considered an integral part of a complete examination. Limited examinations for reoccurring indications may be performed as noted. +-----------------+-------------+----------+---------+ Right Cephalic   Diameter (cm)Depth (cm)Findings  +-----------------+-------------+----------+---------+ Shoulder             0.28        0.27             +-----------------+-------------+----------+---------+ Prox upper arm       0.30        0.23             +-----------------+-------------+----------+---------+ Mid upper arm         0.24        0.27             +-----------------+-------------+----------+---------+ Dist upper arm       0.25        0.17             +-----------------+-------------+----------+---------+ Antecubital fossa    0.31        0.20   branching +-----------------+-------------+----------+---------+ Prox forearm         0.21        0.20             +-----------------+-------------+----------+---------+ Mid forearm          0.15        0.21   branching +-----------------+-------------+----------+---------+ Dist forearm         0.13        0.14             +-----------------+-------------+----------+---------+ +-----------------+-------------+----------+---------+ Right Basilic    Diameter (cm)Depth (cm)Findings  +-----------------+-------------+----------+---------+ Prox upper arm       0.32                         +-----------------+-------------+----------+---------+ Mid upper arm        0.37                         +-----------------+-------------+----------+---------+ Dist upper arm       0.32               branching +-----------------+-------------+----------+---------+ Antecubital fossa    0.30                         +-----------------+-------------+----------+---------+ Prox forearm         0.23                         +-----------------+-------------+----------+---------+ Mid  forearm          0.25                         +-----------------+-------------+----------+---------+ Distal forearm       0.22               branching +-----------------+-------------+----------+---------+ *See table(s) above for measurements and observations.  Diagnosing physician: [name] MD Electronically signed by [name] MD on 05/10/2022 at 4:32:09 AM.    Final    MR ANGIO HEAD WO CONTRAST  Result Date: 05/08/2022 CLINICAL DATA:  Follow-up examination for stroke. EXAM: MRA HEAD WITHOUT CONTRAST TECHNIQUE: Angiographic images of the  Circle of Willis were acquired using MRA technique without intravenous contrast. COMPARISON:  Comparison made with prior brain MRI from 05/06/2022. FINDINGS: Anterior circulation: Examination moderately degraded by motion artifact, limiting assessment. Visualized distal cervical segments of both internal carotid arteries are widely patent with antegrade flow. Petrous, cavernous, and supraclinoid segments patent without significant stenosis or other visible abnormality. A1 segments grossly patent bilaterally. Left A1 dominant, with a hypoplastic right A1. Grossly normal anterior communicating artery complex. Anterior cerebral arteries patent to their distal aspects without convincing stenosis. Both M1 segments are widely patent. No visible proximal MCA branch occlusion or convincing stenosis. Distal MCA branches perfused and fairly symmetric. Posterior circulation: Both V4 segments patent without stenosis. Right vertebral artery dominant. Left PICA patent. Right PICA not seen. Basilar patent without stenosis. Superior cerebral arteries patent bilaterally. Both PCA supplied via the basilar as well as prominent bilateral posterior communicating arteries. PCAs patent to their distal aspects without visible stenosis. Anatomic variants: None significant. Other: No visible intracranial aneurysm. IMPRESSION: 1. Motion degraded exam. 2. Negative intracranial MRA. No large vessel occlusion. No visible hemodynamically significant or correctable stenosis. Electronically Signed   By: Jeannine Boga M.D.   On: 05/08/2022 22:14   VAS US DUPLEX DIALYSIS ACCESS (AVF, AVG)  Result Date: 05/08/2022 DIALYSIS ACCESS Patient Name:  GASPARE NETZEL  Date of Exam:   05/08/2022 Medical Rec #: [id number]      Accession #:    [id number] Date of Birth: 03-19-1966      Patient Gender: M Patient Age:   68 years Exam Location:  [facility] Procedure:      VAS US DUPLEX DIALYSIS ACCESS (AVF, AVG) Referring Phys: Harrell Gave DICKSON  --------------------------------------------------------------------------------  Reason for Exam: Evaluation prior to placement of dialysis access. Access Site: Left Upper Extremity. Access Type: Basilic vein transposition. History: 16/07/8451 - LEFT BASILIC VEIN FISTULA CREATION. Comparison Study: No prior studies. Performing Technologist: Oliver Hum RVT  Examination Guidelines: A complete evaluation includes B-mode imaging, spectral Doppler, color Doppler, and power Doppler as needed of all accessible portions of each vessel. Unilateral testing is considered an integral part of a complete examination. Limited examinations for reoccurring indications may be performed as noted.  Findings:  +------------+----------+-------------+----------+--------+ OUTFLOW VEINPSV (cm/s)Diameter (cm)Depth (cm)Describe +------------+----------+-------------+----------+--------+ Prox UA                   0.48                        +------------+----------+-------------+----------+--------+ Mid UA                    0.45                        +------------+----------+-------------+----------+--------+ Dist UA  0.68                Branch  +------------+----------+-------------+----------+--------+ An area of stenosis is noted in the distal segment of the basilic vein near the anastimosis. Pre 0.76 cm, at 0.37 cm, post 0.72 cm.  Diagnosing physician: [name] MD Electronically signed by [name] MD on 05/08/2022 at 6:19:24 PM.    --------------------------------------------------------------------------------   Final     Cardiac Studies  TTE 05/05/2022  1. Left ventricular ejection fraction, by estimation, is 45 to 50%. The  left ventricle has mildly decreased function. The left ventricle  demonstrates regional wall motion abnormalities with basal to mid  anterolateral and basal to mid inferior  hypokinesis. There is mild concentric left ventricular  hypertrophy. Left  ventricular diastolic parameters are consistent with Grade II diastolic  dysfunction (pseudonormalization).   2. Right ventricular systolic function is normal. The right ventricular  size is mildly enlarged. There is moderately elevated pulmonary artery  systolic pressure. The estimated right ventricular systolic pressure is  58.8 mmHg.   3. Left atrial size was severely dilated.   4. Right atrial size was mildly dilated.   5. The mitral valve is degenerative. Mild to moderate mitral valve  regurgitation. No evidence of mitral stenosis. Moderate mitral annular  calcification.   6. The aortic valve is tricuspid. There is mild calcification of the  aortic valve. Aortic valve regurgitation is not visualized. No aortic  stenosis is present.   7. The inferior vena cava is dilated in size with <50% respiratory  variability, suggesting right atrial pressure of 15 mmHg.   Patient Profile  Petro Talent is a 56 y.o. male with diastolic heart failure, CKD stage V, hypertension, diabetes, latent tuberculosis who was admitted on 05/05/2022 with chest pain and acute systolic heart failure.  Course has been complicated by acute stroke and ongoing chest pain.    Assessment & Plan   # Chest pain, concerning for unstable angina # Elevated troponin # CAD with prior evidence of ischemia on stress testing -Admitted with chest pain symptoms that improved since starting dialysis.  He was CKD stage V.  Now new start on dialysis. -Course complicated by lacunar stroke. -Overall symptoms have improved with dialysis.  His troponins were flat and negative.  His EKG is nonischemic. -Given his lack of symptoms we discussed outpatient management.  He would like to recover from his dialysis*as well as his strokes before ischemia evaluation is pursued.  We will have him follow-up with Dr. Ellyn Hack. -Continue aspirin and Plavix. -Continue statin therapy. -Continue Coreg 25 mg twice daily.  # Acute  systolic heart failure, EF 45 to 50% -Volume status per nephrology now that he is on dialysis. -Increase Coreg to 25 mg twice daily.  Continue Imdur 30 mg daily.  Continue hydralazine 100 mg 3 times daily. -Not a candidate for ACE/ARB/ARNI/MRA given ESRD. -Outpatient follow-up.  # Acute lacunar stroke -Found to have lacunar infarcts this admission.  Likely related small vessel disease. -Neurology requests 30-day outpatient monitor.  We will arrange this with Dr. Ellyn Hack.  [place] will sign off.   Medication Recommendations: Medical management as above Other recommendations (labs, testing, etc): None Follow up as an outpatient: We will arrange outpatient follow-up in 4 to 6 weeks with Dr. Ellyn Hack.  For questions or updates, please contact [place] Please consult [email or web address] for contact info under        Signed, [place] T. Audie Box, MD, [place]  05/10/2022 8:49 AM

## 2022-05-10 NOTE — Progress Notes (Incomplete)
Transported patient from his home unit ,going to hemo dialysis unit ,awake,alert and oriented x 4.Denies pain.  Access used : Right hd catheter that works well .  Duration of treatment : 3 hours  Medicine : Heparin 2,000 units bolus.  Fluid removed :None,keep it even as ordered.  Hemodialysis treatment issue : None  Hands off to the floor nurse.

## 2022-05-10 NOTE — Consult Note (Signed)
Chief Complaint: Patient was seen in consultation today for tunneled catheter for HD at the request of Valentina Gu, NP  Referring Physician(s): Valentina Gu, NP  Supervising Physician: Arne Cleveland  Patient Status: [facility] - In-pt  History of Present Illness: Cody Webster is a 55 y.o. male with medical history significant for HF, HTN and ESRD. Pt presented to ED 05/04/22 c/o chest pain complicated by CVA 21/30/86. Nephrology was consulted and recommended HD d/t uremic symptoms and anemia. Pt underwent HD with current temporary dialysis catheter 12/23 with next HD needed today. Pt has LUE BVT that was placed 2 years ago that is not mature and needs revision.  Pt has been referred to IR for tunneled dialysis catheter placement.   Pt denies fever, CP, abd pain or N/V. He endorses chills, decreased appetite, SOB and HA.  He reports he is NPO per order.  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 ([place]) 2015   EF now improved back to normal mildly reduced   Peripheral neuropathy    Pneumonia    PTSD (post-traumatic stress disorder)    Seizures ([place])    27 years ago    Past Surgical History:  Procedure Laterality Date   AV FISTULA PLACEMENT Left 11/08/2016   Procedure: ARTERIOVENOUS (AV) FISTULA CREATION;  Surgeon: Angelia Mould, MD;  Location: [facility];  Service: Vascular;  Laterality: Left;   AV FISTULA PLACEMENT Left 03/23/2020   Procedure: LEFT BASILIC VEIN FISTULA CREATION;  Surgeon: Angelia Mould, MD;  Location: [facility] OR;  Service: Vascular;  Laterality: Left;   NM MYOVIEW LTD  07/2017    "Moderate sized moderate severity" (on cardiology was very small size, small intensity) partially reversible inferoapical//inferoseptal defect.  (Read as intermediate-high risk) -> over read by cardiology as [place]  09/2019   [facility] Small sized, mild severity reversible defect involving apical lateral & mid anterolateral wall thought to be artifacts - but CRO mild ischemia.  EF 55%. => Read as LOW RISK:   right foot surgery     TOE AMPUTATION     TRANSTHORACIC ECHOCARDIOGRAM  07/2017   EF 60%.,  Moderate LVH.  GR 1 DD.  No R WMA.  Mild RV and moderate RA dilation.   TRANSTHORACIC ECHOCARDIOGRAM  10/03/2019   [facility]: EF 40%, mild MR, mild aortic calcification    Allergies: Morphine and related  Medications: Prior to Admission medications   Medication Sig Start Date End Date Taking? Authorizing Provider  acetaminophen (TYLENOL) 500 MG tablet Take 1,000 mg by mouth every 6 (six) hours as needed (for pain/headaches.).   Yes [provider]  cetirizine (ZYRTEC) 10 MG tablet TAKE 1 TABLET (10 MG TOTAL) BY MOUTH DAILY. 07/27/21  Yes Kerin Perna, NP  cloNIDine (CATAPRES) 0.1 MG tablet TAKE ONE (1) TABLET BY MOUTH THREE TIMES DAILY. Patient taking differently: Take 0.1 mg by mouth 3 (three) times daily. 04/01/22  Yes Kerin Perna, NP  fluticasone (FLONASE) 50 MCG/ACT nasal spray Place 2 sprays  into both nostrils daily. Patient taking differently: Place 2 sprays into both nostrils daily as needed for allergies. 08/25/20  Yes Kerin Perna, NP  gabapentin (NEURONTIN) 300 MG capsule Take 1 capsule (300 mg total) by mouth 2 (two) times daily. Patient taking differently: Take 300 mg by mouth at bedtime. 04/21/22  Yes Newt Minion, MD  guaiFENesin (MUCINEX) 600 MG 12 hr tablet Take 1 tablet (600 mg total) by mouth 2 (two) times daily as needed for to loosen phlegm. 10/01/20  Yes Kerin Perna, NP  oxyCODONE-acetaminophen (PERCOCET)  10-325 MG tablet Take 1 tablet by mouth every 6 (six) hours as needed for pain. 03/02/22 05/31/22 Yes [provider]  traZODone (DESYREL) 100 MG tablet TAKE 1/2 TO 1 TABLET BY MOUTH AT BEDTIME AS NEEDED FOR SLEEP. Patient taking differently: Take 50-100 mg by mouth at bedtime as needed for sleep. 10/27/21  Yes Kerin Perna, NP  aspirin EC 81 MG tablet Take 81 mg by mouth every evening. Patient not taking: Reported on 03/29/2022    [provider]  diclofenac Sodium (VOLTAREN) 1 % GEL Apply 4 g topically 4 (four) times daily. Patient not taking: Reported on 03/29/2022 05/24/21   Kerin Perna, NP  hydrALAZINE (APRESOLINE) 50 MG tablet TAKE 2 TABLETS (100 MG TOTAL) BY MOUTH 3 (THREE) TIMES DAILY. Patient not taking: Reported on 03/29/2022 12/14/17   Leonie Man, MD  isoniazid (NYDRAZID) 300 MG tablet Take 1 tablet (300 mg total) by mouth daily. Patient not taking: Reported on 05/04/2022 03/29/22   Thayer Headings, MD  nitroGLYCERIN (NITROSTAT) 0.4 MG SL tablet Place 1 tablet (0.4 mg total) under the tongue every 5 (five) minutes as needed for chest pain (CP or SOB). 07/22/17   Florencia Reasons, MD  pyridOXINE (VITAMIN B6) 50 MG tablet Take 1 tablet (50 mg total) by mouth daily. Patient not taking: Reported on 05/04/2022 03/29/22   Thayer Headings, MD     Family History  Problem Relation Age of Onset   CAD Mother    Heart attack Mother    Diabetes type II Father    Stroke Father    CAD Father    Hypertension Sister    CAD Sister    Diabetes type II Brother    Colon cancer Maternal Uncle        2015   Colon polyps Neg Hx    Esophageal cancer Neg Hx    Stomach cancer Neg Hx    Rectal cancer Neg Hx     Social History   Socioeconomic History   Marital status: Single    Spouse name: Not on file   Number of children: 1   Years of education: Not on file   Highest education level: Not on file  Occupational History    Comment: Disabled - for Back pain & foot ulcer   Tobacco Use   Smoking status: Every Day    Packs/day: 0.50    Years: 30.00    Total pack years: 15.00    Types: Cigarettes   Smokeless tobacco: Former    Types: Snuff, Chew    Quit date: 12/11/1983  Vaping Use   Vaping Use: Never used  Substance and Sexual Activity   Alcohol use: Not Currently    Comment: occasionally   Drug use: No   Sexual activity: Not Currently  Other Topics Concern   Not on file  Social History Narrative   Not on file   Social Determinants of  Health   Financial Resource Strain: Not on file  Food Insecurity: No Food Insecurity (05/05/2022)   Hunger Vital Sign    Worried About Running Out of Food in the Last Year: Never true    Ran Out of Food in the Last Year: Never true  Transportation Needs: No Transportation Needs (05/05/2022)   PRAPARE - Hydrologist (Medical): No    Lack of Transportation (Non-Medical): No  Physical Activity: Not on file  Stress: Not on file  Social Connections: Not on file     Review of Systems: A 12 point ROS discussed and pertinent positives are indicated in the HPI above.  All other systems are negative.  Review of Systems  Constitutional:  Positive for appetite change and chills. Negative for fever.  Respiratory:  Positive for shortness of breath.   Cardiovascular:  Negative for chest pain and leg swelling.  Gastrointestinal:  Negative for abdominal pain, nausea and vomiting.  Neurological:  Positive for headaches. Negative for dizziness.    Vital Signs: BP (!) 188/88 (BP Location: Right Arm)   Pulse 79   Temp 97.8 F (36.6 C) (Oral)   Resp 13   Ht '6\' 1"'$  (1.854 m)   Wt 165 lb 9.1 oz (75.1 kg)   SpO2 97%   BMI 21.84 kg/m     Physical Exam Vitals reviewed.  Constitutional:      General: He is not in acute distress.    Appearance: He is ill-appearing.  HENT:     Head: Normocephalic and atraumatic.     Mouth/Throat:     Mouth: Mucous membranes are dry.     Pharynx:  Oropharynx is clear.  Cardiovascular:     Rate and Rhythm: Normal rate and regular rhythm.     Pulses: Normal pulses.     Heart sounds: Murmur heard.  Pulmonary:     Effort: Pulmonary effort is normal. No respiratory distress.     Comments: Diminished bilaterally Abdominal:     General: Bowel sounds are normal. There is no distension.     Palpations: Abdomen is soft.     Tenderness: There is no abdominal tenderness. There is no guarding.  Skin:    General: Skin is warm and dry.  Neurological:     Mental Status: He is alert and oriented to person, place, and time.  Psychiatric:        Mood and Affect: Mood normal.        Behavior: Behavior normal.        Thought Content: Thought content normal.        Judgment: Judgment normal.     Imaging: VAS [place] UPPER EXTREMITY ARTERIAL DUPLEX  Result Date: 05/10/2022  UPPER EXTREMITY DUPLEX STUDY Patient Name:  Cody Webster  Date of Exam:   05/09/2022 Medical Rec #: [id number]      Accession #:    [id number] Date of Birth: 1965/09/30      Patient Gender: M Patient Age:   73 years Exam Location:  [facility] Procedure:      VAS [place] UPPER EXTREMITY ARTERIAL DUPLEX Referring Phys: Harrell Gave DICKSON --------------------------------------------------------------------------------  Indications: Diminished radial pulses on clinical exam, patient with first stage              left basilic vein transposition.  Performing Technologist: Darlin Coco RDMS, RVT  Examination Guidelines: A complete evaluation includes B-mode imaging, spectral Doppler, color Doppler, and power Doppler as needed of all accessible portions of each vessel.  Bilateral testing is considered an integral part of a complete examination. Limited examinations for reoccurring indications may be performed as noted.  Right Doppler Findings: +---------------+----------+---------+--------+--------+ Site           PSV (cm/s)Waveform StenosisComments  +---------------+----------+---------+--------+--------+ Subclavian Prox148       triphasic                 +---------------+----------+---------+--------+--------+ Subclavian Dist133       triphasic                 +---------------+----------+---------+--------+--------+ Axillary       169       triphasic                 +---------------+----------+---------+--------+--------+ Brachial Prox  160       triphasic                 +---------------+----------+---------+--------+--------+ Brachial Mid   117       triphasic                 +---------------+----------+---------+--------+--------+ Brachial Dist  116       triphasic                 +---------------+----------+---------+--------+--------+ Radial Prox    107       triphasic                 +---------------+----------+---------+--------+--------+ Radial Mid     100       triphasic                 +---------------+----------+---------+--------+--------+ Radial Dist    143       triphasic                 +---------------+----------+---------+--------+--------+ Ulnar Prox     100       triphasic                 +---------------+----------+---------+--------+--------+ Ulnar Mid      88        triphasic                 +---------------+----------+---------+--------+--------+ Ulnar Dist     107       triphasic                 +---------------+----------+---------+--------+--------+ Palmar Arch    131       triphasic                 +---------------+----------+---------+--------+--------+   Left Doppler Findings: +---------------+----------+---------+-------------+---------------------------+ Site           PSV (cm/s)Waveform Stenosis     Comments                    +---------------+----------+---------+-------------+---------------------------+ Subclavian Prox362                >50% stenosisHighly turbulent, AVF                                                      waveform,  ratio >2.0 as  compared to distal          +---------------+----------+---------+-------------+---------------------------+ Subclavian Mid 265                             Turbulent, AVF waveform     +---------------+----------+---------+-------------+---------------------------+ Subclavian Dist176                             Turbulet, AVF waveform,                                                    Dampened WF                 +---------------+----------+---------+-------------+---------------------------+ Axillary       203                             AVF waveform                +---------------+----------+---------+-------------+---------------------------+ Brachial Prox  219                             AVF waveform                +---------------+----------+---------+-------------+---------------------------+ Brachial Mid   244                             AVF waveform                +---------------+----------+---------+-------------+---------------------------+ Brachial Dist  51        biphasic                                          +---------------+----------+---------+-------------+---------------------------+ Radial Prox    50        biphasic                                          +---------------+----------+---------+-------------+---------------------------+ Radial Mid     70        biphasic                                          +---------------+----------+---------+-------------+---------------------------+ Radial Dist    83        triphasic                                         +---------------+----------+---------+-------------+---------------------------+ Ulnar Prox     63        biphasic                                          +---------------+----------+---------+-------------+---------------------------+ Ulnar Mid      83  biphasic                                           +---------------+----------+---------+-------------+---------------------------+ Ulnar Dist     51        biphasic                                          +---------------+----------+---------+-------------+---------------------------+   Summary:  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 *See table(s) above for measurements and observations. Electronically signed by [name] MD on 05/10/2022 at 4:34:01 AM.    Final    VAS [place] UPPER EXT VEIN MAPPING (PRE-OP AVF)  Result Date: 05/10/2022 UPPER EXTREMITY VEIN MAPPING Patient Name:  Cody Webster  Date of Exam:   05/09/2022 Medical Rec #: [id number]      Accession #:    [id number] Date of Birth: [birth year]      Patient Gender: M Patient Age:   70 years Exam Location:  [facility] Procedure:      VAS [place] UPPER EXT VEIN MAPPING (PRE-OP AVF) Referring Phys: Harrell Gave DICKSON --------------------------------------------------------------------------------  Indications: Pre-access. History: ESRD, history of LEFT basilic vein AVF creation 03-23-2020.  Comparison Study: 03-10-2020 Prior upper extremity vein mapping. Performing Technologist: Darlin Coco RDMS, RVT  Examination Guidelines: A complete evaluation includes B-mode imaging, spectral Doppler, color Doppler, and power Doppler as needed of all accessible portions of each vessel. Bilateral testing is considered an integral part of a complete examination. Limited examinations for reoccurring indications may be performed as noted. +-----------------+-------------+----------+---------+ Right Cephalic   Diameter (cm)Depth (cm)Findings  +-----------------+-------------+----------+---------+ Shoulder             0.28        0.27             +-----------------+-------------+----------+---------+ Prox upper arm       0.30        0.23             +-----------------+-------------+----------+---------+ Mid upper arm         0.24        0.27             +-----------------+-------------+----------+---------+ Dist upper arm       0.25        0.17             +-----------------+-------------+----------+---------+ Antecubital fossa    0.31        0.20   branching +-----------------+-------------+----------+---------+ Prox forearm         0.21        0.20             +-----------------+-------------+----------+---------+ Mid forearm          0.15        0.21   branching +-----------------+-------------+----------+---------+ Dist forearm         0.13        0.14             +-----------------+-------------+----------+---------+ +-----------------+-------------+----------+---------+ Right Basilic    Diameter (cm)Depth (cm)Findings  +-----------------+-------------+----------+---------+ Prox upper arm       0.32                         +-----------------+-------------+----------+---------+  Mid upper arm        0.37                         +-----------------+-------------+----------+---------+ Dist upper arm       0.32               branching +-----------------+-------------+----------+---------+ Antecubital fossa    0.30                         +-----------------+-------------+----------+---------+ Prox forearm         0.23                         +-----------------+-------------+----------+---------+ Mid forearm          0.25                         +-----------------+-------------+----------+---------+ Distal forearm       0.22               branching +-----------------+-------------+----------+---------+ *See table(s) above for measurements and observations.  Diagnosing physician: [name] MD Electronically signed by [name] MD on 05/10/2022 at 4:32:09 AM.    Final    MR ANGIO HEAD WO CONTRAST  Result Date: 05/08/2022 CLINICAL DATA:  Follow-up examination for stroke. EXAM: MRA HEAD WITHOUT CONTRAST TECHNIQUE: Angiographic images of the  Circle of Willis were acquired using MRA technique without intravenous contrast. COMPARISON:  Comparison made with prior brain MRI from 05/06/2022. FINDINGS: Anterior circulation: Examination moderately degraded by motion artifact, limiting assessment. Visualized distal cervical segments of both internal carotid arteries are widely patent with antegrade flow. Petrous, cavernous, and supraclinoid segments patent without significant stenosis or other visible abnormality. A1 segments grossly patent bilaterally. Left A1 dominant, with a hypoplastic right A1. Grossly normal anterior communicating artery complex. Anterior cerebral arteries patent to their distal aspects without convincing stenosis. Both M1 segments are widely patent. No visible proximal MCA branch occlusion or convincing stenosis. Distal MCA branches perfused and fairly symmetric. Posterior circulation: Both V4 segments patent without stenosis. Right vertebral artery dominant. Left PICA patent. Right PICA not seen. Basilar patent without stenosis. Superior cerebral arteries patent bilaterally. Both PCA supplied via the basilar as well as prominent bilateral posterior communicating arteries. PCAs patent to their distal aspects without visible stenosis. Anatomic variants: None significant. Other: No visible intracranial aneurysm. IMPRESSION: 1. Motion degraded exam. 2. Negative intracranial MRA. No large vessel occlusion. No visible hemodynamically significant or correctable stenosis. Electronically Signed   By: Jeannine Boga M.D.   On: 05/08/2022 22:14   VAS US DUPLEX DIALYSIS ACCESS (AVF, AVG)  Result Date: 05/08/2022 DIALYSIS ACCESS Patient Name:  Cody Webster  Date of Exam:   05/08/2022 Medical Rec #: [id number]      Accession #:    [id number] Date of Birth: 11/24/1965      Patient Gender: M Patient Age:   70 years Exam Location:  [facility] Procedure:      VAS US DUPLEX DIALYSIS ACCESS (AVF, AVG) Referring Phys: Harrell Gave DICKSON  --------------------------------------------------------------------------------  Reason for Exam: Evaluation prior to placement of dialysis access. Access Site: Left Upper Extremity. Access Type: Basilic vein transposition. History: 81/12/2991 - LEFT BASILIC VEIN FISTULA CREATION. Comparison Study: No prior studies. Performing Technologist: Oliver Hum RVT  Examination Guidelines: A complete evaluation includes B-mode imaging, spectral Doppler, color Doppler, and power Doppler as needed of all accessible  portions of each vessel. Unilateral testing is considered an integral part of a complete examination. Limited examinations for reoccurring indications may be performed as noted.  Findings:  +------------+----------+-------------+----------+--------+ OUTFLOW VEINPSV (cm/s)Diameter (cm)Depth (cm)Describe +------------+----------+-------------+----------+--------+ Prox UA                   0.48                        +------------+----------+-------------+----------+--------+ Mid UA                    0.45                        +------------+----------+-------------+----------+--------+ Dist UA                   0.68                Branch  +------------+----------+-------------+----------+--------+ An area of stenosis is noted in the distal segment of the basilic vein near the anastimosis. Pre 0.76 cm, at 0.37 cm, post 0.72 cm.  Diagnosing physician: [name] MD Electronically signed by [name] MD on 05/08/2022 at 6:19:24 PM.    --------------------------------------------------------------------------------   Final    DG CHEST PORT 1 VIEW  Result Date: 05/07/2022 CLINICAL DATA:  Status post insertion of hemodialysis catheter. EXAM: PORTABLE CHEST 1 VIEW COMPARISON:  Chest radiograph 05/04/2022 and earlier FINDINGS: Interval placement of right IJ central venous catheter with the distal tip of the level of the mid SVC. Stable enlarged cardiomediastinal silhouette  which could be exaggerated secondary to portable technique and patient positioning. Aortic calcifications. No focal consolidation, pleural effusion, or pneumothorax. IMPRESSION: Interval placement of right IJ central venous catheter with the distal tip of the level of the mid SVC. Electronically Signed   By: Ileana Roup M.D.   On: 05/07/2022 14:45   VAS [place] TRANSCRANIAL DOPPLER  Result Date: 05/07/2022  Transcranial Doppler Patient Name:  Cody Webster  Date of Exam:   05/06/2022 Medical Rec #: [id number]      Accession #:    [id number] Date of Birth: 1965-11-26      Patient Gender: M Patient Age:   38 years Exam Location:  [facility] Procedure:      VAS [place] TRANSCRANIAL DOPPLER Referring Phys: Elwin Sleight DE LA TORRE --------------------------------------------------------------------------------  Indications: Stroke. History: HTN, HLD, SMK, DM, CHF, CKD, CVA. 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 Comparison Study: No previous exams Performing Technologist: Jody Hill RVT, RDMS  Examination Guidelines: A complete evaluation includes B-mode imaging, spectral Doppler, color Doppler, and power Doppler as needed of all accessible portions of each vessel. Bilateral testing is considered an integral part of a complete examination. Limited examinations for reoccurring indications may be performed as noted.  +----------+---------------+----------+-----------+-------------+ RIGHT TCD Right VM (cm/s)Depth (cm)Pulsatility   Comment    +----------+---------------+----------+-----------+-------------+ MCA                                           not insonated +----------+---------------+----------+-----------+-------------+ ACA  not insonated +----------+---------------+----------+-----------+-------------+  Term ICA                                      not insonated +----------+---------------+----------+-----------+-------------+ PCA P1                                        not insonated +----------+---------------+----------+-----------+-------------+ Opthalmic                                     not insonated +----------+---------------+----------+-----------+-------------+ ICA siphon                                    not insonated +----------+---------------+----------+-----------+-------------+ Vertebral       -70                   1.11                  +----------+---------------+----------+-----------+-------------+  +----------+--------------+----------+-----------+-------------+ LEFT TCD  Left VM (cm/s)Depth (cm)Pulsatility   Comment    +----------+--------------+----------+-----------+-------------+ MCA             29                   0.98                  +----------+--------------+----------+-----------+-------------+ ACA            -24                   0.88                  +----------+--------------+----------+-----------+-------------+ Term ICA                                     not insonated +----------+--------------+----------+-----------+-------------+ PCA P1                                       not insonated +----------+--------------+----------+-----------+-------------+ Opthalmic       14                   0.88                  +----------+--------------+----------+-----------+-------------+ ICA siphon      21                   2.00                  +----------+--------------+----------+-----------+-------------+ Vertebral      -31                   1.06                  +----------+--------------+----------+-----------+-------------+ Distal ICA                                   not insonated +----------+--------------+----------+-----------+-------------+  +------------+-------+-------+  VM  cm/sComment +------------+-------+-------+ Prox Basilar  -77   1.12   +------------+-------+-------+ Dist Basilar  -75   1.15   +------------+-------+-------+ Summary:  Absent right temporal and poor biorbital and left temporalwindows limit evaluation of anterior circulation vessels.Mildly elevated mean flow velocities in bilateral vertebral and basilar arteries of unc;lear significance. *See table(s) above for TCD measurements and observations.  Diagnosing physician: Antony Contras MD Electronically signed by Antony Contras MD on 05/07/2022 at 9:14:15 AM.    Final    VAS US CAROTID  Result Date: 05/06/2022 Carotid Arterial Duplex Study Patient Name:  Cody Webster  Date of Exam:   05/06/2022 Medical Rec #: [id number]      Accession #:    [id number] Date of Birth: 1965-08-16      Patient Gender: M Patient Age:   86 years Exam Location:  [facility] Procedure:      VAS US CAROTID Referring Phys: Elwin Sleight DE LA TORRE --------------------------------------------------------------------------------  Indications:   CVA. Risk Factors:  Hypertension, hyperlipidemia, Diabetes, current smoker, prior                CVA. Other Factors: CHF, CKD. Limitations    Today's exam was limited due to heavy calcification and the                resulting shadowing and patient movement. Performing Technologist: Rogelia Rohrer RVT, RDMS  Examination Guidelines: A complete evaluation includes B-mode imaging, spectral Doppler, color Doppler, and power Doppler as needed of all accessible portions of each vessel. Bilateral testing is considered an integral part of a complete examination. Limited examinations for reoccurring indications may be performed as noted.  Right Carotid Findings: +----------+--------+--------+--------+------------------+---------+           PSV cm/sEDV cm/sStenosisPlaque DescriptionComments  +----------+--------+--------+--------+------------------+---------+ CCA Prox  120     18               calcific          Shadowing +----------+--------+--------+--------+------------------+---------+ CCA Distal148     25              calcific          Shadowing +----------+--------+--------+--------+------------------+---------+ ICA Prox  149     35      40-59%  calcific          Shadowing +----------+--------+--------+--------+------------------+---------+ ICA Mid   122     28              calcific          Shadowing +----------+--------+--------+--------+------------------+---------+ ICA Distal85      19                                          +----------+--------+--------+--------+------------------+---------+ ECA       171                     calcific          shadowing +----------+--------+--------+--------+------------------+---------+ +----------+--------+-------+----------------+-------------------+           PSV cm/sEDV cmsDescribe        Arm Pressure (mmHG) +----------+--------+-------+----------------+-------------------+ [id number]            Multiphasic, WNL                    +----------+--------+-------+----------------+-------------------+ +---------+--------+--+--------+--+---------+ VertebralPSV cm/s75EDV cm/s23Antegrade +---------+--------+--+--------+--+---------+  Left Carotid Findings: +----------+-------+--------+--------+-----------------------+-----------------+  PSV    EDV cm/sStenosisPlaque Description     Comments                    cm/s                                                            +----------+-------+--------+--------+-----------------------+-----------------+ CCA Prox  106    22                                     intimal                                                                   thickening        +----------+-------+--------+--------+-----------------------+-----------------+ CCA Distal113    21              heterogenous and                                                           calcific                                 +----------+-------+--------+--------+-----------------------+-----------------+ ICA Prox  138    39      40-59%  calcific               Shadowing         +----------+-------+--------+--------+-----------------------+-----------------+ ICA Mid   100    24              calcific               Shadowing         +----------+-------+--------+--------+-----------------------+-----------------+ ICA Distal90     23                                                       +----------+-------+--------+--------+-----------------------+-----------------+ ECA       244            >50%    calcific               shadowing         +----------+-------+--------+--------+-----------------------+-----------------+ +----------+--------+--------+--------+-------------------+           PSV cm/sEDV cm/sDescribeArm Pressure (mmHG) +----------+--------+--------+--------+-------------------+ [id number]     63      Stenotic                    +----------+--------+--------+--------+-------------------+ +---------+--------+--+--------+--+---------+ VertebralPSV cm/s76EDV cm/s23Antegrade +---------+--------+--+--------+--+---------+   Summary: Right Carotid: Velocities in the right ICA are consistent with a 40-59%  stenosis. Left Carotid: Velocities in the left ICA are consistent with a 40-59% stenosis.               The ECA appears >50% stenosed. Vertebrals:  Bilateral vertebral arteries demonstrate antegrade flow. Subclavians: Left subclavian artery was stenotic. Normal flow hemodynamics were              seen in the right subclavian artery. *See table(s) above for measurements and observations.  Electronically signed by [name] MD on 05/06/2022 at 5:56:55 PM.    Final    MR BRAIN WO CONTRAST  Result Date: 05/06/2022 CLINICAL DATA:  Stroke follow-up EXAM: MRI HEAD WITHOUT CONTRAST TECHNIQUE:  Multiplanar, multiecho pulse sequences of the brain and surrounding structures were obtained without intravenous contrast. COMPARISON:  Head CT from yesterday FINDINGS: Brain: Subcentimeter areas of restricted diffusion in the subcortical left frontal lobe and temporal occipital region. Small area of restricted diffusion in the posterior right pons. Advanced chronic small vessel ischemia with extensive gliosis and small vessel infarcts affecting the brainstem, deep gray nuclei, and deep white matter tracks. Premature cerebral volume loss. Chronic microhemorrhages with hypertensive pattern. No acute hemorrhage or hydrocephalus.  No masslike finding. Vascular: Major flow voids are preserved Skull and upper cervical spine: Normal marrow signal. 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 Right more than left mastoid opacification with normal nasopharynx. IMPRESSION: 1. Small acute infarcts in the right posterior pons and subcortical left cerebral hemisphere. 2. Background of severe chronic small vessel ischemia. Electronically Signed   By: Jorje Guild M.D.   On: 05/06/2022 05:29   CT HEAD WO CONTRAST (5MM)  Result Date: 05/05/2022 CLINICAL DATA:  Slurred speech EXAM: CT HEAD WITHOUT CONTRAST TECHNIQUE: Contiguous axial images were obtained from the base of the skull through the vertex without intravenous contrast. RADIATION DOSE REDUCTION: This exam was performed according to the departmental dose-optimization program which includes automated exposure control, adjustment of the mA and/or kV according to patient size and/or use of iterative reconstruction technique. COMPARISON:  01/11/2015 FINDINGS: Brain: No evidence of acute infarction, hemorrhage, hydrocephalus, extra-axial collection or mass lesion/mass effect. Subcortical white matter and periventricular small vessel ischemic changes. Chronic lacunar infarcts in the right corona radiata. Vascular: Intracranial atherosclerosis. Skull: Normal. Negative for  fracture or focal lesion. Sinuses/Orbits: Postprocedural changes involving the right globe. The visualized paranasal sinuses are essentially clear. The mastoid air cells are unopacified. Other: None. IMPRESSION: No evidence of acute intracranial abnormality. Small vessel ischemic changes. Chronic lacunar infarcts in the right corona radiata. Electronically Signed   By: Julian Hy M.D.   On: 05/05/2022 21:17   ECHOCARDIOGRAM COMPLETE  Result Date: 05/05/2022    ECHOCARDIOGRAM REPORT   Patient Name:   Cody Webster Date of Exam: 05/05/2022 Medical Rec #:  [id number]     Height:       73.0 in Accession #:    [id number]    Weight:       180.0 lb Date of Birth:  1966/05/03     BSA:          2.057 m Patient Age:    28 years      BP:           143/91 mmHg Patient Gender: M             HR:           74 bpm. Exam Location:  Inpatient Procedure: 2D Echo, Color Doppler and Cardiac Doppler Indications:    J19.41 Acute diastolic (  congestive) heart failure  History:        Patient has prior history of Echocardiogram examinations, most                 recent 07/17/2020. Risk Factors:Hypertension, Diabetes,                 Dyslipidemia, Sleep Apnea and CKD.  Sonographer:    Raquel Sarna Senior RDCS Referring Phys: [id number] [place]  1. Left ventricular ejection fraction, by estimation, is 45 to 50%. The left ventricle has mildly decreased function. The left ventricle demonstrates regional wall motion abnormalities with basal to mid anterolateral and basal to mid inferior hypokinesis. There is mild concentric left ventricular hypertrophy. Left ventricular diastolic parameters are consistent with Grade II diastolic dysfunction (pseudonormalization).  2. Right ventricular systolic function is normal. The right ventricular size is mildly enlarged. There is moderately elevated pulmonary artery systolic pressure. The estimated right ventricular systolic pressure is 48.5 mmHg.  3. Left atrial size was severely dilated.   4. Right atrial size was mildly dilated.  5. The mitral valve is degenerative. Mild to moderate mitral valve regurgitation. No evidence of mitral stenosis. Moderate mitral annular calcification.  6. The aortic valve is tricuspid. There is mild calcification of the aortic valve. Aortic valve regurgitation is not visualized. No aortic stenosis is present.  7. The inferior vena cava is dilated in size with <50% respiratory variability, suggesting right atrial pressure of 15 mmHg. FINDINGS  Left Ventricle: Left ventricular ejection fraction, by estimation, is 45 to 50%. The left ventricle has mildly decreased function. The left ventricle demonstrates regional wall motion abnormalities. The left ventricular internal cavity size was normal in size. There is mild concentric left ventricular hypertrophy. Left ventricular diastolic parameters are consistent with Grade II diastolic dysfunction (pseudonormalization). Right Ventricle: The right ventricular size is mildly enlarged. No increase in right ventricular wall thickness. Right ventricular systolic function is normal. There is moderately elevated pulmonary artery systolic pressure. The tricuspid regurgitant velocity is 3.01 m/s, and with an assumed right atrial pressure of 15 mmHg, the estimated right ventricular systolic pressure is 46.2 mmHg. Left Atrium: Left atrial size was severely dilated. Right Atrium: Right atrial size was mildly dilated. Pericardium: Trivial pericardial effusion is present. Mitral Valve: The mitral valve is degenerative in appearance. There is moderate calcification of the mitral valve leaflet(s). Moderate mitral annular calcification. Mild to moderate mitral valve regurgitation. No evidence of mitral valve stenosis. MV peak gradient, 6.9 mmHg. The mean mitral valve gradient is 3.0 mmHg. Tricuspid Valve: The tricuspid valve is normal in structure. Tricuspid valve regurgitation is trivial. Aortic Valve: The aortic valve is tricuspid. There is  mild calcification of the aortic valve. Aortic valve regurgitation is not visualized. No aortic stenosis is present. Aortic valve mean gradient measures 6.0 mmHg. Aortic valve peak gradient measures 13.2 mmHg. Aortic valve area, by VTI measures 2.16 cm. Pulmonic Valve: The pulmonic valve was normal in structure. Pulmonic valve regurgitation is not visualized. Aorta: The aortic root is normal in size and structure. Venous: The inferior vena cava is dilated in size with less than 50% respiratory variability, suggesting right atrial pressure of 15 mmHg. IAS/Shunts: No atrial level shunt detected by color flow Doppler.  LEFT VENTRICLE PLAX 2D LVIDd:         5.60 cm      Diastology LVIDs:         4.30 cm      LV e' medial:    4.23  cm/s LV PW:         1.50 cm      LV E/e' medial:  26.7 LV IVS:        1.10 cm      LV e' lateral:   7.14 cm/s LVOT diam:     1.80 cm      LV E/e' lateral: 15.8 LV SV:         75 LV SV Index:   37 LVOT Area:     2.54 cm  LV Volumes (MOD) LV vol d, MOD A2C: 173.0 ml LV vol d, MOD A4C: 182.0 ml LV vol s, MOD A2C: 88.1 ml LV vol s, MOD A4C: 84.7 ml LV SV MOD A2C:     84.9 ml LV SV MOD A4C:     182.0 ml LV SV MOD BP:      88.4 ml RIGHT VENTRICLE RV S prime:     12.40 cm/s TAPSE (M-mode): 2.4 cm LEFT ATRIUM              Index        RIGHT ATRIUM           Index LA diam:        5.30 cm  2.58 cm/m   RA Area:     21.80 cm LA Vol (A2C):   121.0 ml 58.81 ml/m  RA Volume:   66.30 ml  32.23 ml/m LA Vol (A4C):   126.0 ml 61.24 ml/m LA Biplane Vol: 127.0 ml 61.73 ml/m  AORTIC VALVE AV Area (Vmax):    1.85 cm AV Area (Vmean):   2.27 cm AV Area (VTI):     2.16 cm AV Vmax:           182.00 cm/s AV Vmean:          112.000 cm/s AV VTI:            0.349 m AV Peak Grad:      13.2 mmHg AV Mean Grad:      6.0 mmHg LVOT Vmax:         132.00 cm/s LVOT Vmean:        99.900 cm/s LVOT VTI:          0.296 m LVOT/AV VTI ratio: 0.85  AORTA Ao Root diam: 3.20 cm Ao Asc diam:  3.00 cm MITRAL VALVE                   TRICUSPID VALVE MV Area (PHT): 4.71 cm       TR Peak grad:   36.2 mmHg MV Area VTI:   2.22 cm       TR Vmax:        301.00 cm/s MV Peak grad:  6.9 mmHg MV Mean grad:  3.0 mmHg       SHUNTS MV Vmax:       1.31 m/s       Systemic VTI:  0.30 m MV Vmean:      81.0 cm/s      Systemic Diam: 1.80 cm MV Decel Time: 161 msec MR Peak grad:    78.9 mmHg MR Mean grad:    43.0 mmHg MR Vmax:         444.00 cm/s MR Vmean:        320.0 cm/s MR PISA:         3.08 cm MR PISA Eff ROA: 21 mm MR PISA Radius:  0.70 cm MV E velocity: 113.00 cm/s MV A  velocity: 40.50 cm/s MV E/A ratio:  2.79 Dalton McleanMD Electronically signed by Franki Monte Signature Date/Time: 05/05/2022/10:58:03 AM    Final    DG Chest 2 View  Result Date: 05/04/2022 CLINICAL DATA:  cp EXAM: CHEST - 2 VIEW COMPARISON:  03/29/2012 FINDINGS: Cardiac silhouette enlarged. No evidence of pneumothorax or pleural effusion. No evidence of pulmonary edema. Aorta is calcified. No osseous abnormalities identified. IMPRESSION: Enlarged cardiac silhouette. Electronically Signed   By: Sammie Bench M.D.   On: 05/04/2022 13:19    Labs:  CBC: Recent Labs    05/04/22 1319 05/05/22 0025 05/06/22 0026 05/10/22 0037  WBC 4.2 4.2 4.5 5.7  HGB 7.0* 7.3* 8.7* 8.7*  HCT 22.8* 23.2* 27.4* 27.3*  PLT 187 191 178 202    COAGS: No results for input(s): "INR", "APTT" in the last 8760 hours.  BMP: Recent Labs    05/07/22 0029 05/08/22 0548 05/09/22 0032 05/10/22 0037  NA 139 138 139 140  K 4.8 3.7 4.3 4.4  CL 111 103 104 107  CO2 17* '24 24 25  '$ GLUCOSE 96 74 101* 107*  BUN 57* 31* 42* 44*  CALCIUM 8.5* 8.5* 8.3* 8.6*  CREATININE 10.45* 6.63* 7.75* 8.16*  GFRNONAA 5* 9* 7* 7*    LIVER FUNCTION TESTS: Recent Labs    03/29/22 0919 05/04/22 2251 05/07/22 0029 05/08/22 0548 05/09/22 0032 05/10/22 0037  BILITOT 0.3  --   --   --   --   --   AST 7*  --   --   --   --   --   ALT 10  --   --   --   --   --   PROT 6.3  --   --   --   --   --    ALBUMIN  --    < > 3.4* 3.0* 2.8* 3.0*   < > = values in this interval not displayed.    TUMOR MARKERS: No results for input(s): "AFPTM", "CEA", "CA199", "CHROMGRNA" in the last 8760 hours.  Assessment and Plan:  56 yo male presents to IR for tunneled HD catheter placement.   Pt resting in bed. He is A&O, calm and pleasant.  He is in no distress.  Risks and benefits discussed with the patient including, but not limited to bleeding, infection, vascular injury, pneumothorax which may require chest tube placement, air embolism or even death  All of the patient's questions were answered, patient is agreeable to proceed. Consent signed and in chart.  Thank you for this interesting consult.  I greatly enjoyed meeting Justen Fonda and look forward to participating in their care.  A copy of this report was sent to the requesting provider on this date.  Electronically Signed: Tyson Alias, NP 05/10/2022, 9:39 AM   I spent a total of 20 minutes in face to face in clinical consultation, greater than 50% of which was counseling/coordinating care for tunneled dialysis catheter for HD.

## 2022-05-10 NOTE — Progress Notes (Signed)
Patient ID: Cody Webster, male   DOB: 1966/05/03, 56 y.o.   MRN: [id number] S: seen in room, having pain attacks after [facility] placement. Getting IV pain meds. No c/o's.   O:BP (!) 160/74 (BP Location: Right Arm)   Pulse 75   Temp 98.6 F (37 C) (Oral)   Resp 13   Ht '6\' 1"'$  (1.854 m)   Wt 75.5 kg   SpO2 99%   BMI 21.96 kg/m   Exam Gen: NAD CVS: RRR Resp: CTA Abd: +BS, soft, NT/ND Ext: no edema, LUE AVF +T/B  Assessment/Plan: Acute CVA's - small acute infarcts in right posterior pons and subcortical left cerebral hemisphere.  Underlying severe chronic small vessel ischemia.  Neuro following.  ESRD - presented w/ uremic symptoms. Initially refused HD but then decided to try a short trial of HD to see how he tolerates it.  1st HD session was 12/23, 2nd HD will be today.  SP TDC by IR today. Also will need CLIP to OP HD unit.  Vascular access - pt had LUE BVT surgery 2 years ago, but AVF it is not yet mature and will require 2nd stage surgery.  Had temp cath placed 05/07/22 by Dr. Ander Slade.  Dr. Scot Dock will arrange for second stage vs new access as on outpatient.  SP TDC by IR today 12/26.  Chest pain - likely due to demand ischemia from profound anemia, however also has many risk factors for CAD Anemia of CKD stage V - agree with blood transfusions prn. Started weekly darbe 60 mcg SQ on 12/21  Chronic combined systolic and diastolic CHF Metabolic acidosis - started oral sodium bicarb HTN - poorly controlled.  Carvedilol, hydralazine, and imdur as at home.  Allowing passive HTN per neuro due to recent ischemic strokes. 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  Started atarax, Sarna prn Disposition - needs CLIP to OP HD unit prior to [employer], MD 05/10/2022, 4:42 PM  Recent Labs  Lab 05/06/22 0026 05/07/22 0029 05/09/22 0032 05/10/22 0037  HGB 8.7*  --   --  8.7*  ALBUMIN 3.0*   < > 2.8* 3.0*  CALCIUM 8.1*   < > 8.3* 8.6*  PHOS  5.7*   < > 4.1 4.8*  CREATININE 10.19*   < > 7.75* 8.16*  K 4.8   < > 4.3 4.4   < > = values in this interval not displayed.     Inpatient medications:  (feeding supplement) PROSource Plus  30 mL Oral BID BM   amLODipine  5 mg Oral Daily   aspirin EC  81 mg Oral QPM   atorvastatin  40 mg Oral Daily   carvedilol  25 mg Oral BID WC   chlorhexidine       Chlorhexidine Gluconate Cloth  6 each Topical Daily   Chlorhexidine Gluconate Cloth  6 each Topical Q0600   clopidogrel  75 mg Oral Daily   darbepoetin (ARANESP) injection - NON-DIALYSIS  60 mcg Subcutaneous Q Thu-1800   feeding supplement (NEPRO CARB STEADY)  237 mL Oral BID BM   hydrALAZINE  100 mg Oral TID   hydrOXYzine  10 mg Oral TID   isosorbide mononitrate  60 mg Oral Daily   loratadine  10 mg Oral Daily   multivitamin  1 tablet Oral QHS   nicotine  14 mg Transdermal Daily   pantoprazole  40 mg Oral QHS   sevelamer carbonate  800 mg Oral TID WC   sodium bicarbonate  650 mg Oral BID    acetaminophen, camphor-menthol, chlorhexidine, diphenhydrAMINE, fluticasone, heparin, hydrALAZINE, HYDROmorphone (DILAUDID) injection, labetalol, nitroGLYCERIN, oxyCODONE-acetaminophen **AND** oxyCODONE, prochlorperazine, traZODone

## 2022-05-10 NOTE — Care Management (Signed)
  Transition of Care (TOC) Screening Note   Patient Details  Name: Cody Webster Date of Birth: 1966/04/07   Transition of Care [facility]) CM/SW Contact:    Carles Collet, RN Phone Number: 05/10/2022, 9:59 AM    Transition of Care Department [facility]) has reviewed patient and is following  patient advancement through interdisciplinary progression rounds.     From home alone, will be new HD at DC

## 2022-05-10 NOTE — Progress Notes (Signed)
VASCULAR SURGERY ASSESSMENT & PLAN:   END-STAGE RENAL DISEASE: The patient is having a tunneled dialysis catheter placed by radiology today.  It is not clear why the temporary catheter was placed initially.  HEMODIALYSIS ACCESS: Based on his preoperative studies, his next best option for hemodialysis access would be potentially a right brachiocephalic AV fistula.  I will schedule this as an outpatient as he is anxious for discharge.   SUBJECTIVE:   No specific complaints.  PHYSICAL EXAM:    Vitals:   05/09/22 0800 05/09/22 1938 05/09/22 2300 05/10/22 0311  BP: (!) 153/62 (!) 137/57 (!) 161/72 (!) 173/81  Pulse: 79     Resp:    15  Temp:  98.3 F (36.8 C) 97.6 F (36.4 C) 97.7 F (36.5 C)  TempSrc:  Oral Oral Oral  SpO2:      Weight:      Height:       Unable to palpate left radial pulse.  LABS:   RIGHT UPPER EXTREMITY VEIN MAP: I have independently interpreted the vein map of the right upper extremity.  The forearm cephalic vein is very small and not adequate for the fistula.  The upper arm cephalic vein is marginal in size with diameters ranging from 2.4-3.1 mm.  The basilic vein on the right is marginal in size with diameters ranging from 3.0-3.7 mm.  BILATERAL UPPER EXTREMITY ARTERIAL DUPLEX: I have independently interpreted the bilateral upper extremity arterial duplex.  On the right side the patient has triphasic flow throughout the entire right upper extremity with no evidence of significant arterial occlusive disease.  On the left side there is evidence of a subclavian artery stenosis.  Patient has biphasic flow in the radial and ulnar positions. 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)   Symptomatic anemia   TB lung, latent   Metabolic acidosis   CKD (chronic kidney disease), stage V (HCC)   Acute ischemic stroke (HCC)   Acute congestive heart failure (HCC)   Elevated troponin   Unstable angina (HCC)   CURRENT MEDS:    (feeding supplement) PROSource Plus  30 mL Oral BID BM   amLODipine  5 mg Oral Daily   aspirin EC  81 mg Oral QPM   atorvastatin  40 mg Oral Daily   carvedilol  12.5 mg Oral BID WC   Chlorhexidine Gluconate Cloth  6 each Topical Daily   Chlorhexidine Gluconate Cloth  6 each Topical Q0600   clopidogrel  75 mg Oral Daily   darbepoetin (ARANESP) injection - NON-DIALYSIS  60 mcg Subcutaneous Q Thu-1800   feeding supplement (NEPRO CARB STEADY)  237 mL Oral BID BM   hydrALAZINE  100 mg Oral TID   hydrOXYzine  10 mg Oral TID   isosorbide mononitrate  60 mg Oral Daily   loratadine  10 mg Oral Daily   multivitamin  1 tablet Oral QHS   nicotine  14 mg Transdermal  Daily   pantoprazole  40 mg Oral QHS   sevelamer carbonate  800 mg Oral TID WC   sodium bicarbonate  650 mg Oral BID    [name] Office: [phone] 05/10/2022

## 2022-05-11 DIAGNOSIS — I5042 Chronic combined systolic (congestive) and diastolic (congestive) heart failure: Secondary | ICD-10-CM | POA: Diagnosis not present

## 2022-05-11 DIAGNOSIS — R079 Chest pain, unspecified: Secondary | ICD-10-CM | POA: Diagnosis not present

## 2022-05-11 DIAGNOSIS — D649 Anemia, unspecified: Secondary | ICD-10-CM | POA: Diagnosis not present

## 2022-05-11 DIAGNOSIS — I132 Hypertensive heart and chronic kidney disease with heart failure and with stage 5 chronic kidney disease, or end stage renal disease: Secondary | ICD-10-CM | POA: Diagnosis not present

## 2022-05-11 DIAGNOSIS — E875 Hyperkalemia: Secondary | ICD-10-CM | POA: Diagnosis not present

## 2022-05-11 DIAGNOSIS — R0602 Shortness of breath: Secondary | ICD-10-CM | POA: Diagnosis not present

## 2022-05-11 DIAGNOSIS — N185 Chronic kidney disease, stage 5: Secondary | ICD-10-CM | POA: Diagnosis not present

## 2022-05-11 LAB — RENAL FUNCTION PANEL
Albumin: 3.2 g/dL — ABNORMAL LOW (ref 3.5–5.0)
Anion gap: 9 (ref 5–15)
BUN: 23 mg/dL — ABNORMAL HIGH (ref 6–20)
CO2: 29 mmol/L (ref 22–32)
Calcium: 8.6 mg/dL — ABNORMAL LOW (ref 8.9–10.3)
Chloride: 99 mmol/L (ref 98–111)
Creatinine, Ser: 5.14 mg/dL — ABNORMAL HIGH (ref 0.61–1.24)
GFR, Estimated: 12 mL/min — ABNORMAL LOW (ref >60)
Glucose, Bld: 132 mg/dL — ABNORMAL HIGH (ref 70–99)
Phosphorus: 2.8 mg/dL (ref 2.5–4.6)
Potassium: 3.6 mmol/L (ref 3.5–5.1)
Sodium: 137 mmol/L (ref 135–145)

## 2022-05-11 MED ORDER — AMLODIPINE BESYLATE 10 MG PO TABS
10.0000 mg | ORAL_TABLET | Freq: Every day | ORAL | Status: DC
  Administered 2022-05-11 – 2022-05-12 (×2): 10 mg via ORAL
  Filled 2022-05-11 (×2): qty 1

## 2022-05-11 MED ORDER — POLYETHYLENE GLYCOL 3350 17 G PO PACK
17.0000 g | PACK | Freq: Every day | ORAL | Status: DC
  Administered 2022-05-11: 17 g via ORAL
  Filled 2022-05-11: qty 1

## 2022-05-11 MED ORDER — CHLORHEXIDINE GLUCONATE CLOTH 2 % EX PADS
6.0000 | MEDICATED_PAD | Freq: Every day | CUTANEOUS | Status: DC
  Administered 2022-05-12: 6 via TOPICAL

## 2022-05-11 MED ORDER — BISACODYL 10 MG RE SUPP
10.0000 mg | Freq: Once | RECTAL | Status: DC
  Filled 2022-05-11: qty 1

## 2022-05-11 MED ORDER — HYDRALAZINE HCL 20 MG/ML IJ SOLN: 10.0000 mg | INTRAMUSCULAR | Status: DC | PRN

## 2022-05-11 MED ORDER — INFLUENZA VAC SPLIT QUAD 0.5 ML IM SUSY: 0.5000 mL | PREFILLED_SYRINGE | INTRAMUSCULAR | Status: DC

## 2022-05-11 NOTE — Progress Notes (Signed)
Tightwad Sink, Pt's niece, came to visit and insisting from nursing staff to print a copy of  patient's medical record. She said " I am his POA". Advised to request this from Medical record office in the AM since they were close at night. She was adamant to get a copy. AC was called and spoke to her. Medical record office's phone number provided to her. Pt is alert 4X. And aware of what the situation is.

## 2022-05-11 NOTE — Progress Notes (Signed)
Requested to see pt for out-pt HD needs. Spoke to pt via phone this morning. Introduced self and explained role. Pt resides in the [place] area and prefers a clinic in this area if possible. Pt states he does not drive but pt's brother can assist with transportation to HD appts at d/c. Discussed TCU at [facility] (M,T,Th,Fr) for dialysis and education vs 3x's a week in-center. Pt voices interest in TCU to be educated on home therapy options in the event pt is a candidate. Referral submitted to [facility] admissions this morning for TCU chair. Will assist as needed.   Melven Sartorius Renal Navigator [phone]

## 2022-05-11 NOTE — Progress Notes (Signed)
Mobility Specialist Progress Note    05/11/22 1449  Mobility  Activity Refused mobility   Pt c/o headache, shoulder hurting from surgery, and wanting to go home. Will f/u as schedule permits.   Hildred Alamin Mobility Specialist  Please Psychologist, sport and exercise or Rehab Office at [phone]

## 2022-05-11 NOTE — Progress Notes (Signed)
Patient ID: Cody Webster, male   DOB: 1966/02/03, 56 y.o.   MRN: [id number] S: seen in room, having pain attacks after [facility] placement. Getting IV pain meds. No c/o's.   O:BP (!) 158/72 (BP Location: Right Arm)   Pulse 71   Temp 98.1 F (36.7 C) (Oral)   Resp 20   Ht '6\' 1"'$  (1.854 m)   Wt 75.5 kg   SpO2 99%   BMI 21.96 kg/m   Exam Gen: NAD CVS: RRR Resp: CTA Abd: +BS, soft, NT/ND Ext: no edema, LUE AVF +T/B  Assessment/Plan: Acute CVA's - small acute infarcts in right posterior pons and subcortical left cerebral hemisphere.  Underlying severe chronic small vessel ischemia.  Neuro following.  ESRD - presented w/ uremic symptoms. Initially refused HD but then decided to try a short trial of HD to see how he tolerates it.  Peak creat was 10.4. 1st HD session was 12/23, 2nd HD yesterday using new TDC. Plan HD tomorrow. Awaiting CLIP to OP HD unit.  Vascular access - pt had LUE BVT surgery 2 years ago, but AVF it is not yet mature and will require 2nd stage surgery.  Had temp cath placed 05/07/22 by Dr. Ander Slade.  Dr. Scot Dock will arrange for second stage vs new access as outpatient.  SP TDC by IR 12/26.  Chest pain - likely due to demand ischemia from profound anemia, also has many risk factors for CAD Anemia of CKD stage V - agree with blood transfusions prn. Started weekly darbe 60 mcg SQ on 12/21  Chronic combined systolic and diastolic CHF HTN - BP's better, getting po carvedilol, hydralazine, and imdur as at home.  Allowing passive HTN per neuro due to recent ischemic strokes. 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  Started atarax, Sarna prn Disposition - needs CLIP to OP HD unit prior to [employer], MD 05/11/2022, 1:12 PM  Recent Labs  Lab 05/06/22 0026 05/07/22 0029 05/10/22 0037 05/11/22 0023  HGB 8.7*  --  8.7*  --   ALBUMIN 3.0*   < > 3.0* 3.2*  CALCIUM 8.1*   < > 8.6* 8.6*  PHOS 5.7*   < > 4.8* 2.8  CREATININE 10.19*    < > 8.16* 5.14*  K 4.8   < > 4.4 3.6   < > = values in this interval not displayed.     Inpatient medications:  (feeding supplement) PROSource Plus  30 mL Oral BID BM   amLODipine  10 mg Oral Daily   aspirin EC  81 mg Oral QPM   atorvastatin  40 mg Oral Daily   carvedilol  25 mg Oral BID WC   Chlorhexidine Gluconate Cloth  6 each Topical Daily   clopidogrel  75 mg Oral Daily   darbepoetin (ARANESP) injection - NON-DIALYSIS  60 mcg Subcutaneous Q Thu-1800   feeding supplement (NEPRO CARB STEADY)  237 mL Oral BID BM   hydrALAZINE  100 mg Oral TID   hydrOXYzine  10 mg Oral TID   isosorbide mononitrate  60 mg Oral Daily   loratadine  10 mg Oral Daily   multivitamin  1 tablet Oral QHS   nicotine  14 mg Transdermal Daily   pantoprazole  40 mg Oral QHS   sevelamer carbonate  800 mg Oral TID WC   sodium bicarbonate  650 mg Oral BID    acetaminophen, camphor-menthol, diphenhydrAMINE, fluticasone, hydrALAZINE, HYDROmorphone (DILAUDID) injection, labetalol,  nitroGLYCERIN, oxyCODONE-acetaminophen **AND** oxyCODONE, prochlorperazine, traZODone

## 2022-05-11 NOTE — Progress Notes (Signed)
PROGRESS NOTE    Cody Webster  XNA:[id number] DOB: 1966/02/13 DOA: 05/04/2022 PCP: Kerin Perna, NP   Brief Narrative:  Cody Webster is a 56 y.o. male with medical history significant of CKD5, HTN, GERD, latent TB. Presented with chest pain. He reports that he was on medication for latent TB, but he stopped because he thought it was making him sick. No other complaint.  Admitted to hospital service at [facility].  Cardiology and nephrology consulted.  Nephrology recommended hemodialysis, he has functional LUE AVF but patient refused dialysis in the beginning but eventually agreed and he was transferred to [facility] for that reason.  Cardiology also followed him.  They are recommending cardiac cath at some point in time during this hospitalization.  On the evening of 05/05/2022, patient was noted to have dysarthria and facial droop and code stroke was called.  MRI shows acute infarct in right posterior pons and subcortical left cerebral hemisphere.  Neurology consulted.  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  Echo shows slightly reduced LVEF, previously it was 50 to 55% and now it is 45 to 50%.  Also has regional wall motion from release with hypokinesis.  Grade 2 diastolic dysfunction.  Cardiology on board and initially plans were to do cardiac catheterization during hospitalization however now cardiology has decided to defer this to  outpatient.  They recommend treating medically for now.  CKD stage V/hyperphosphatemia/hyperkalemia/metabolic acidosis/uremia: Nephrology on board, patient has nonfunctioning LUE AVF.  Patient underwent temporary right IJ HD catheter placed by PCCM on 05/07/2022 and he was started on hemodialysis . Per vascular surgery , His duplex of his first stage left basilic vein transposition shows that the fistula is small and therefore he is not a candidate for a second stage basilic vein transposition. This fistula has been in for 2 years. He will need to be evaluated for new access.  They are planning to do this as outpatient, IR is consulted and patient underwent placement of tunneled catheter on 05/10/2022.  Nephrology on board for dialysis.  Patient will need clip process before discharge.  Acute ischemic stroke/essential hypertension: MRI report shows acute ischemic stroke.  He is only on aspirin and now also on Plavix.  He is on atorvastatin as well. Neurology recommends 30-day CardioNet monitor as outpatient to rule out A-fib.  Blood pressure still elevated, he is on hydralazine 100 mg 3 times daily, Imdur 60 mg p.o. daily and Coreg 25 mg p.o. twice daily, and amlodipine 5 mg.  Will increase amlodipine to 10 mg.  Continue as needed hydralazine.  Anemia of chronic disease: Patient's hemoglobin dropped to 7 on 05/04/2022 and he received 1 unit of PRBC transfusion.  Hemoglobin improved.  Latent TB: Patient stopped taking medications and he is refusing again.  He has the capacity to make decisions for himself.  DVT prophylaxis: SCDs Start: 05/04/22 2227   Code Status: DNR  Family  Communication:  None present at bedside.  Plan of care discussed with patient in length and he/she verbalized understanding and agreed with it.  Status is: Inpatient Remains inpatient appropriate because: Plan for permanent catheter today.  Will need CLIP process before discharge.   Estimated body mass index is 21.96 kg/m as  calculated from the following:   Height as of this encounter: '6\' 1"'$  (1.854 m).   Weight as of this encounter: 75.5 kg.    Nutritional Assessment: Body mass index is 21.96 kg/m.Marland Kitchen Seen by dietician.  I agree with the assessment and plan as outlined below: Nutrition Status: Nutrition Problem: Increased nutrient needs Etiology: acute illness, chronic illness Signs/Symptoms: estimated needs Interventions: Nepro shake, Prostat  . Skin Assessment: I have examined the patient's skin and I agree with the wound assessment as performed by the wound care RN as outlined below:    Consultants:  Nephrology Cardiology Neurology   Procedures:  As above  Antimicrobials:  Anti-infectives (From admission, onward)    Start     Dose/Rate Route Frequency Ordered Stop   05/10/22 1211  ceFAZolin (ANCEF) IVPB 2g/100 mL premix        over 30 Minutes Intravenous Continuous PRN 05/10/22 1211 05/10/22 1245         Subjective:  Patient seen and examined.  He has no complaints.  He is very frustrated for being in the hospital and wants to go home.  Objective: Vitals:   05/10/22 2124 05/10/22 2314 05/11/22 0350 05/11/22 0736  BP: (!) 165/60 (!) 160/74 (!) 171/88   Pulse:      Resp:  16  18  Temp:  98.4 F (36.9 C) 97.6 F (36.4 C) 98.3 F (36.8 C)  TempSrc:  Oral Oral Oral  SpO2:    99%  Weight:      Height:        Intake/Output Summary (Last 24 hours) at 05/11/2022 0937 Last data filed at 05/11/2022 0400 Gross per 24 hour  Intake 220 ml  Output 900 ml  Net -680 ml    Filed Weights   05/08/22 0546 05/10/22 1534 05/10/22 1912  Weight: 75.1 kg 75.5 kg 75.5 kg    Examination:  General exam: Appears calm and comfortable  Respiratory system: Clear to auscultation. Respiratory effort normal. Cardiovascular system: S1 & S2 heard, RRR. No JVD, murmurs, rubs, gallops or clicks. No pedal edema. Gastrointestinal system: Abdomen is nondistended, soft and nontender. No organomegaly or  masses felt. Normal bowel sounds heard. Central nervous system: Alert and oriented. No focal neurological deficits. Extremities: Symmetric 5 x 5 power. Skin: No rashes, lesions or ulcers.  Psychiatry: Judgement and insight appear normal. Mood & affect appropriate.   Data Reviewed: I have personally reviewed following labs and imaging studies  CBC: Recent Labs  Lab 05/04/22 1319 05/05/22 0025 05/06/22 0026 05/10/22 0037  WBC 4.2 4.2 4.5 5.7  HGB 7.0* 7.3* 8.7* 8.7*  HCT 22.8* 23.2* 27.4* 27.3*  MCV 101.8* 100.4* 97.2 98.6  PLT 187 191 178 024    Basic Metabolic Panel: Recent Labs  Lab 05/07/22 0029 05/08/22 0548 05/09/22 0032 05/10/22 0037 05/11/22 0023  NA 139 138 139 140 137  K 4.8 3.7 4.3 4.4 3.6  CL 111 103 104 107 99  CO2 17* '24 24 25 29  '$ GLUCOSE 96 74 101* 107* 132*  BUN 57* 31* 42* 44* 23*  CREATININE 10.45* 6.63* 7.75* 8.16* 5.14*  CALCIUM 8.5* 8.5* 8.3* 8.6* 8.6*  PHOS 5.6* 3.0 4.1 4.8* 2.8  GFR: Estimated Creatinine Clearance: 16.7 mL/min (A) (by C-G formula based on SCr of 5.14 mg/dL (H)). Liver Function Tests: Recent Labs  Lab 05/07/22 0029 05/08/22 0548 05/09/22 0032 05/10/22 0037 05/11/22 0023  ALBUMIN 3.4* 3.0* 2.8* 3.0* 3.2*    No results for input(s): "LIPASE", "AMYLASE" in the last 168 hours. No results for input(s): "AMMONIA" in the last 168 hours. Coagulation Profile: No results for input(s): "INR", "PROTIME" in the last 168 hours. Cardiac Enzymes: No results for input(s): "CKTOTAL", "CKMB", "CKMBINDEX", "TROPONINI" in the last 168 hours. BNP (last 3 results) No results for input(s): "PROBNP" in the last 8760 hours. HbA1C: No results for input(s): "HGBA1C" in the last 72 hours. CBG: Recent Labs  Lab 05/05/22 2138  GLUCAP 105*    Lipid Profile: No results for input(s): "CHOL", "HDL", "LDLCALC", "TRIG", "CHOLHDL", "LDLDIRECT" in the last 72 hours.  Thyroid Function Tests: No results for input(s): "TSH", "T4TOTAL", "FREET4",  "T3FREE", "THYROIDAB" in the last 72 hours. Anemia Panel: No results for input(s): "VITAMINB12", "FOLATE", "FERRITIN", "TIBC", "IRON", "RETICCTPCT" in the last 72 hours.  Sepsis Labs: No results for input(s): "PROCALCITON", "LATICACIDVEN" in the last 168 hours.  Recent Results (from the past 240 hour(s))  Resp panel by RT-PCR (RSV, Flu A&B, Covid) Anterior Nasal Swab     Status: None   Collection Time: 05/04/22  1:02 PM   Specimen: Anterior Nasal Swab  Result Value Ref Range Status   SARS Coronavirus 2 by RT PCR NEGATIVE NEGATIVE Final    Comment: (NOTE) SARS-CoV-2 target nucleic acids are NOT DETECTED.  The SARS-CoV-2 RNA is generally detectable in upper respiratory specimens during the acute phase of infection. The lowest concentration of SARS-CoV-2 viral copies this assay can detect is 138 copies/mL. A negative result does not preclude SARS-Cov-2 infection and should not be used as the sole basis for treatment or other patient management decisions. A negative result may occur with  improper specimen collection/handling, submission of specimen other than nasopharyngeal swab, presence of viral mutation(s) within the areas targeted by this assay, and inadequate number of viral copies(<138 copies/mL). A negative result must be combined with clinical observations, patient history, and epidemiological information. The expected result is Negative.  Fact Sheet for Patients:  EntrepreneurPulse.com.au  Fact Sheet for Healthcare Providers:  IncredibleEmployment.be  This test is no t yet approved or cleared by the [place] FDA and  has been authorized for detection and/or diagnosis of SARS-CoV-2 by FDA under an Emergency Use Authorization (EUA). This EUA will remain  in effect (meaning this test can be used) for the duration of the COVID-19 declaration under Section 564(b)(1) of the Act, 21 U.S.C.section 360bbb-3(b)(1), unless the authorization  is terminated  or revoked sooner.       Influenza A by PCR NEGATIVE NEGATIVE Final   Influenza B by PCR NEGATIVE NEGATIVE Final    Comment: (NOTE) The Xpert Xpress SARS-CoV-2/FLU/RSV plus assay is intended as an aid in the diagnosis of influenza from Nasopharyngeal swab specimens and should not be used as a sole basis for treatment. Nasal washings and aspirates are unacceptable for Xpert Xpress SARS-CoV-2/FLU/RSV testing.  Fact Sheet for Patients: EntrepreneurPulse.com.au  Fact Sheet for Healthcare Providers: IncredibleEmployment.be  This test is not yet approved or cleared by the [place] FDA and has been authorized for detection and/or diagnosis of SARS-CoV-2 by FDA under an Emergency Use Authorization (EUA). This EUA will remain in effect (meaning this test can be used) for the duration of the COVID-19 declaration under Section 564(b)(1)  of the Act, 21 U.S.C. section 360bbb-3(b)(1), unless the authorization is terminated or revoked.     Resp Syncytial Virus by PCR NEGATIVE NEGATIVE Final    Comment: (NOTE) Fact Sheet for Patients: EntrepreneurPulse.com.au  Fact Sheet for Healthcare Providers: IncredibleEmployment.be  This test is not yet approved or cleared by the [place] FDA and has been authorized for detection and/or diagnosis of SARS-CoV-2 by FDA under an Emergency Use Authorization (EUA). This EUA will remain in effect (meaning this test can be used) for the duration of the COVID-19 declaration under Section 564(b)(1) of the Act, 21 U.S.C. section 360bbb-3(b)(1), unless the authorization is terminated or revoked.  Performed at [facility], [place] [address], [place], [place] [address]   MRSA Next Gen by PCR, Nasal     Status: None   Collection Time: 05/05/22 11:41 PM   Specimen: Nasal Mucosa; Nasal Swab  Result Value Ref Range Status   MRSA by PCR Next Gen NOT  DETECTED NOT DETECTED Final    Comment: (NOTE) The GeneXpert MRSA Assay (FDA approved for NASAL specimens only), is one component of a comprehensive MRSA colonization surveillance program. It is not intended to diagnose MRSA infection nor to guide or monitor treatment for MRSA infections. Test performance is not FDA approved in patients less than 33 years old. Performed at [facility], [place] [address] [place], [place] [address]      Radiology Studies: IR Fluoro Guide CV Line Right  Result Date: 05/10/2022 CLINICAL DATA:  Renal failure, needs durable long-term access for hemodialysis EXAM: TUNNELED HEMODIALYSIS CATHETER PLACEMENT WITH ULTRASOUND AND FLUOROSCOPIC GUIDANCE TECHNIQUE: The procedure, risks , benefits, and alternatives were explained to the patient. Questions regarding the procedure were encouraged and answered. The patient understands and consents to the procedure. As antibiotic prophylaxis, cefazolin 2 g was ordered pre-procedure and administered intravenously within one hour of incision.patency of the right IJ vein was confirmed with ultrasound with image documentation. An appropriate skin site was determined. Region was prepped using maximum barrier technique including cap and mask, sterile gown, sterile gloves, large sterile sheet, and Chlorhexidine as cutaneous antisepsis. The region was infiltrated locally with 1% lidocaine. Intravenous Fentanyl 54mg and Versed '1mg'$  were administered as conscious sedation during continuous monitoring of the patient's level of consciousness and physiological / cardiorespiratory status by the radiology RN, with a total moderate sedation time of 14 minutes. Under real-time ultrasound guidance, the right IJ vein was accessed with a 21 gauge micropuncture needle; the needle tip within the vein was confirmed with ultrasound image documentation. Needle exchanged over the 018 guidewire for transitional dilator, which allowed advancement of a Benson  wire into the IVC. Over this, an MPA catheter was advanced. A Palindrome 19 hemodialysis catheter was tunneled from the right anterior chest wall approach to the right IJ dermatotomy site. The MPA catheter was exchanged over an Amplatz wire for serial vascular dilators which allow placement of a peel-away sheath, through which the catheter was advanced under intermittent fluoroscopy, positioned with its tips in the proximal and midright atrium. Spot chest radiograph confirms good catheter position. No pneumothorax. Catheter was flushed and primed per protocol. Catheter secured externally with O Prolene sutures. The right IJ dermatotomy site was closed with Dermabond. COMPLICATIONS: COMPLICATIONS None immediate FLUOROSCOPY: Radiation Exposure Index (as provided by the fluoroscopic device): 1 mGy air Kerma COMPARISON:  None Available. IMPRESSION: 1. Technically successful placement of tunneled right IJ hemodialysis catheter with ultrasound and fluoroscopic guidance. Ready for routine use. ACCESS:  Remains approachable for percutaneous intervention as needed. Electronically Signed   By: Lucrezia Europe M.D.   On: 05/10/2022 13:32   IR US Guide Vasc Access Right  Result Date: 05/10/2022 CLINICAL DATA:  Renal failure, needs durable long-term access for hemodialysis EXAM: TUNNELED HEMODIALYSIS CATHETER PLACEMENT WITH ULTRASOUND AND FLUOROSCOPIC GUIDANCE TECHNIQUE: The procedure, risks , benefits, and alternatives were explained to the patient. Questions regarding the procedure were encouraged and answered. The patient understands and consents to the procedure. As antibiotic prophylaxis, cefazolin 2 g was ordered pre-procedure and administered intravenously within one hour of incision.patency of the right IJ vein was confirmed with ultrasound with image documentation. An appropriate skin site was determined. Region was prepped using maximum barrier technique including cap and mask, sterile gown, sterile gloves, large  sterile sheet, and Chlorhexidine as cutaneous antisepsis. The region was infiltrated locally with 1% lidocaine. Intravenous Fentanyl 65mg and Versed '1mg'$  were administered as conscious sedation during continuous monitoring of the patient's level of consciousness and physiological / cardiorespiratory status by the radiology RN, with a total moderate sedation time of 14 minutes. Under real-time ultrasound guidance, the right IJ vein was accessed with a 21 gauge micropuncture needle; the needle tip within the vein was confirmed with ultrasound image documentation. Needle exchanged over the 018 guidewire for transitional dilator, which allowed advancement of a Benson wire into the IVC. Over this, an MPA catheter was advanced. A Palindrome 19 hemodialysis catheter was tunneled from the right anterior chest wall approach to the right IJ dermatotomy site. The MPA catheter was exchanged over an Amplatz wire for serial vascular dilators which allow placement of a peel-away sheath, through which the catheter was advanced under intermittent fluoroscopy, positioned with its tips in the proximal and midright atrium. Spot chest radiograph confirms good catheter position. No pneumothorax. Catheter was flushed and primed per protocol. Catheter secured externally with O Prolene sutures. The right IJ dermatotomy site was closed with Dermabond. COMPLICATIONS: COMPLICATIONS None immediate FLUOROSCOPY: Radiation Exposure Index (as provided by the fluoroscopic device): 1 mGy air Kerma COMPARISON:  None Available. IMPRESSION: 1. Technically successful placement of tunneled right IJ hemodialysis catheter with ultrasound and fluoroscopic guidance. Ready for routine use. ACCESS: Remains approachable for percutaneous intervention as needed. Electronically Signed   By: DLucrezia EuropeM.D.   On: 05/10/2022 13:32   VAS [place]PPER EXTREMITY ARTERIAL DUPLEX  Result Date: 05/10/2022  UPPER EXTREMITY DUPLEX STUDY Patient Name:  AMARCELLOUS SNARSKI Date of  Exam:   05/09/2022 Medical Rec #: [id number]     Accession #:    [id number] of Birth: 405-28-1967     Patient Gender: M Patient Age:   89years [facility]      VAS [place] EXTREMITY ARTERIAL DUPLEX Referring Phys: CHarrell GaveDICKSON --------------------------------------------------------------------------------  Indications: Diminished radial pulses on clinical exam, patient with first stage              left basilic vein transposition.  Performing Technologist: RDarlin CocoRDMS, RVT  Examination Guidelines: A complete evaluation includes B-mode imaging, spectral Doppler, color Doppler, and power Doppler as needed of all accessible portions of each vessel. Bilateral testing is considered an integral part of a complete examination. Limited examinations for reoccurring indications may be performed as noted.  Right Doppler Findings: +---------------+----------+---------+--------+--------+ Site           PSV (cm/s)Waveform StenosisComments +---------------+----------+---------+--------+--------+ Subclavian Prox148       triphasic                 +---------------+----------+---------+--------+--------+  Subclavian Dist133       triphasic                 +---------------+----------+---------+--------+--------+ Axillary       169       triphasic                 +---------------+----------+---------+--------+--------+ Brachial Prox  160       triphasic                 +---------------+----------+---------+--------+--------+ Brachial Mid   117       triphasic                 +---------------+----------+---------+--------+--------+ Brachial Dist  116       triphasic                 +---------------+----------+---------+--------+--------+ Radial Prox    107       triphasic                 +---------------+----------+---------+--------+--------+ Radial Mid     100       triphasic                  +---------------+----------+---------+--------+--------+ Radial Dist    143       triphasic                 +---------------+----------+---------+--------+--------+ Ulnar Prox     100       triphasic                 +---------------+----------+---------+--------+--------+ Ulnar Mid      88        triphasic                 +---------------+----------+---------+--------+--------+ Ulnar Dist     107       triphasic                 +---------------+----------+---------+--------+--------+ Palmar Arch    131       triphasic                 +---------------+----------+---------+--------+--------+   Left Doppler Findings: +---------------+----------+---------+-------------+---------------------------+ Site           PSV (cm/s)Waveform Stenosis     Comments                    +---------------+----------+---------+-------------+---------------------------+ Subclavian Prox362                >50% stenosisHighly turbulent, AVF                                                      waveform, ratio >2.0 as                                                    compared to distal          +---------------+----------+---------+-------------+---------------------------+ Subclavian Mid 265                             Turbulent, AVF waveform     +---------------+----------+---------+-------------+---------------------------+ Subclavian Dist176  Turbulet, AVF waveform,                                                    Dampened WF                 +---------------+----------+---------+-------------+---------------------------+ Axillary       203                             AVF waveform                +---------------+----------+---------+-------------+---------------------------+ Brachial Prox  219                             AVF waveform                +---------------+----------+---------+-------------+---------------------------+  Brachial Mid   244                             AVF waveform                +---------------+----------+---------+-------------+---------------------------+ Brachial Dist  51        biphasic                                          +---------------+----------+---------+-------------+---------------------------+ Radial Prox    50        biphasic                                          +---------------+----------+---------+-------------+---------------------------+ Radial Mid     70        biphasic                                          +---------------+----------+---------+-------------+---------------------------+ Radial Dist    83        triphasic                                         +---------------+----------+---------+-------------+---------------------------+ Ulnar Prox     63        biphasic                                          +---------------+----------+---------+-------------+---------------------------+ Ulnar Mid      83        biphasic                                          +---------------+----------+---------+-------------+---------------------------+ Ulnar Dist     51        biphasic                                          +---------------+----------+---------+-------------+---------------------------+  Summary:  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 *See table(s) above for measurements and observations. Electronically signed by [name] MD on 05/10/2022 at 4:34:01 AM.    Final    VAS [place] UPPER EXT VEIN MAPPING (PRE-OP AVF)  Result Date: 05/10/2022 UPPER EXTREMITY VEIN MAPPING Patient Name:  CAYMEN DUBRAY  Date of Exam:   05/09/2022 Medical Rec #: [id number]      Accession #:    [id number] Date of Birth: 20-Apr-1966      Patient Gender: M Patient Age:   48 years Exam [facility] Procedure:      VAS [place] UPPER EXT VEIN MAPPING (PRE-OP AVF) Referring  Phys: Harrell Gave DICKSON --------------------------------------------------------------------------------  Indications: Pre-access. History: ESRD, history of LEFT basilic vein AVF creation 03-23-2020.  Comparison Study: 03-10-2020 Prior upper extremity vein mapping. Performing Technologist: Darlin Coco RDMS, RVT  Examination Guidelines: A complete evaluation includes B-mode imaging, spectral Doppler, color Doppler, and power Doppler as needed of all accessible portions of each vessel. Bilateral testing is considered an integral part of a complete examination. Limited examinations for reoccurring indications may be performed as noted. +-----------------+-------------+----------+---------+ Right Cephalic   Diameter (cm)Depth (cm)Findings  +-----------------+-------------+----------+---------+ Shoulder             0.28        0.27             +-----------------+-------------+----------+---------+ Prox upper arm       0.30        0.23             +-----------------+-------------+----------+---------+ Mid upper arm        0.24        0.27             +-----------------+-------------+----------+---------+ Dist upper arm       0.25        0.17             +-----------------+-------------+----------+---------+ Antecubital fossa    0.31        0.20   branching +-----------------+-------------+----------+---------+ Prox forearm         0.21        0.20             +-----------------+-------------+----------+---------+ Mid forearm          0.15        0.21   branching +-----------------+-------------+----------+---------+ Dist forearm         0.13        0.14             +-----------------+-------------+----------+---------+ +-----------------+-------------+----------+---------+ Right Basilic    Diameter (cm)Depth (cm)Findings  +-----------------+-------------+----------+---------+ Prox upper arm       0.32                          +-----------------+-------------+----------+---------+ Mid upper arm        0.37                         +-----------------+-------------+----------+---------+ Dist upper arm       0.32               branching +-----------------+-------------+----------+---------+ Antecubital fossa    0.30                         +-----------------+-------------+----------+---------+ Prox forearm  0.23                         +-----------------+-------------+----------+---------+ Mid forearm          0.25                         +-----------------+-------------+----------+---------+ Distal forearm       0.22               branching +-----------------+-------------+----------+---------+ *See table(s) above for measurements and observations.  Diagnosing physician: [name] MD Electronically signed by [name] MD on 05/10/2022 at 4:32:09 AM.    Final     Scheduled Meds:  (feeding supplement) PROSource Plus  30 mL Oral BID BM   amLODipine  5 mg Oral Daily   aspirin EC  81 mg Oral QPM   atorvastatin  40 mg Oral Daily   carvedilol  25 mg Oral BID WC   Chlorhexidine Gluconate Cloth  6 each Topical Daily   clopidogrel  75 mg Oral Daily   darbepoetin (ARANESP) injection - NON-DIALYSIS  60 mcg Subcutaneous Q Thu-1800   feeding supplement (NEPRO CARB STEADY)  237 mL Oral BID BM   hydrALAZINE  100 mg Oral TID   hydrOXYzine  10 mg Oral TID   isosorbide mononitrate  60 mg Oral Daily   loratadine  10 mg Oral Daily   multivitamin  1 tablet Oral QHS   nicotine  14 mg Transdermal Daily   pantoprazole  40 mg Oral QHS   sevelamer carbonate  800 mg Oral TID WC   sodium bicarbonate  650 mg Oral BID   Continuous Infusions:     LOS: 6 days   Darliss Cheney, MD [facility]  05/11/2022, 9:37 AM   *Please note that this is a verbal dictation therefore any spelling or grammatical errors are due to the "[place] [employer]" system interpretation.  Please page via  [place] and do not message via secure chat for urgent patient care matters. Secure chat can be used for non urgent patient care matters.  How to contact the [facility] Attending or Consulting provider [place] or covering provider during after hours [place], for this patient?  Check the care team in [facility] and look for a) attending/consulting TRH provider listed and b) the [facility] team listed. Page or secure chat [phone]. Log into [email or web address] and use [place]'s universal password to access. If you do not have the password, please contact the hospital operator. Locate the [facility] provider you are looking for under [facility] and page to a number that you can be directly reached. If you still have difficulty reaching the provider, please page the [facility] (Director on Call) for the Hospitalists listed on amion for assistance.

## 2022-05-11 NOTE — Progress Notes (Signed)
Physical Therapy Treatment & Discharge Patient Details Name: Cody Webster MRN: [id number] DOB: 1966/03/01 Today's Date: 05/11/2022   History of Present Illness Pt is a 56 y.o. male admitted 05/04/22 with chest pain. 12/21 pt developed dysarthria, L facial droop. 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 Pt having iHD trial while admitted. Cardiac cath deferred to outpatient. S/p RIJ tunneled HD CVC placement 12/26. PMH includes ESRD, HTN, GERD, CHF, latent TB, chronic back pain, tobacco use.   PT Comments    Pt requiring max encouragement for OOB mobility secondary to c/o fatigue. Suspect pt functioning near baseline mobility and cognition. Pt remains focused on wanting to go home. Reviewed education and encouraged more frequent activity with mobility specialist. Pt reports having necessary support from family, declines follow-up PT services. Will d/c acute PT.    Recommendations for follow up therapy are one component of a multi-disciplinary discharge planning process, led by the attending physician.  Recommendations may be updated based on patient status, additional functional criteria and insurance authorization.  Follow Up Recommendations  No PT follow up     Assistance Recommended at Discharge Intermittent Supervision/Assistance  Patient can return home with the following Assistance with cooking/housework;Assist for transportation;Help with stairs or ramp for entrance;Direct supervision/assist for financial management;Direct supervision/assist for medications management   Equipment Recommendations  None recommended by PT    Recommendations for Other Services       Precautions / Restrictions Precautions Precautions: Fall;Other (comment) Precaution Comments: h/o R eye blindness Restrictions Weight Bearing Restrictions: No     Mobility  Bed Mobility Overal bed mobility: Independent                  Transfers Overall transfer  level: Independent Equipment used: None                    Ambulation/Gait Ambulation/Gait assistance: Counsellor (Feet): 40 Feet Assistive device: None Gait Pattern/deviations: Step-through pattern, Decreased stride length, Staggering right, Drifts right/left Gait velocity: Decreased     General Gait Details: slow, mildly unsteady gait with multiple self-corrected bouts of instability; pt declines further distance secondary to fatigue   Stairs             Wheelchair Mobility    Modified Rankin (Stroke Patients Only)       Balance Overall balance assessment: Needs assistance   Sitting balance-Leahy Scale: Good     Standing balance support: No upper extremity supported, During functional activity Standing balance-Leahy Scale: Fair                              Cognition Arousal/Alertness: Awake/alert Behavior During Therapy: Flat affect Overall Cognitive Status: No family/caregiver present to determine baseline cognitive functioning                                 General Comments: oriented, WFL for simple tasks, decreased awareness of medical status and still focused on wanting to go home        Exercises      General Comments General comments (skin integrity, edema, etc.): pt focused on wanting to go home, not interested in participating in mobility outside of room, required max encouragement for OOB      Pertinent Vitals/Pain Pain Assessment Pain Assessment: Faces Faces Pain Scale: Hurts a little bit Pain Location: "everywhere" including  RIJ HD cath insertion Pain Descriptors / Indicators: Discomfort Pain Intervention(s): Monitored during session    Home Living                          Prior Function            PT Goals (current goals can now be found in the care plan section) Progress towards PT goals: Goals met/education completed, patient discharged from PT    Frequency    Min  3X/week      PT Plan Current plan remains appropriate    Co-evaluation              AM-PAC PT "6 Clicks" Mobility   Outcome Measure  Help needed turning from your back to your side while in a flat bed without using bedrails?: None Help needed moving from lying on your back to sitting on the side of a flat bed without using bedrails?: None Help needed moving to and from a bed to a chair (including a wheelchair)?: A Little Help needed standing up from a chair using your arms (e.g., wheelchair or bedside chair)?: None Help needed to walk in hospital room?: A Little Help needed climbing 3-5 steps with a railing? : A Little 6 Click Score: 21    End of Session Equipment Utilized During Treatment: Gait belt Activity Tolerance: Patient limited by fatigue Patient left: in bed;with call bell/phone within reach;with family/visitor present Nurse Communication: Mobility status PT Visit Diagnosis: Other abnormalities of gait and mobility (R26.89);Muscle weakness (generalized) (M62.81)     Time: 5750-5183 PT Time Calculation (min) (ACUTE ONLY): 14 min  Charges:  $Therapeutic Activity: 8-22 mins                      Mabeline Caras, PT, DPT Acute Rehabilitation Services  Personal: [place] [facility] Office: [place] 05/11/2022, 9:30 AM

## 2022-05-12 ENCOUNTER — Encounter (HOSPITAL_COMMUNITY): Payer: Self-pay

## 2022-05-12 ENCOUNTER — Other Ambulatory Visit (HOSPITAL_COMMUNITY): Payer: Self-pay

## 2022-05-12 DIAGNOSIS — R079 Chest pain, unspecified: Secondary | ICD-10-CM | POA: Diagnosis not present

## 2022-05-12 DIAGNOSIS — N185 Chronic kidney disease, stage 5: Secondary | ICD-10-CM | POA: Diagnosis not present

## 2022-05-12 DIAGNOSIS — R0602 Shortness of breath: Secondary | ICD-10-CM | POA: Diagnosis not present

## 2022-05-12 DIAGNOSIS — E875 Hyperkalemia: Secondary | ICD-10-CM | POA: Diagnosis not present

## 2022-05-12 DIAGNOSIS — Z8673 Personal history of transient ischemic attack (TIA), and cerebral infarction without residual deficits: Secondary | ICD-10-CM | POA: Insufficient documentation

## 2022-05-12 DIAGNOSIS — I5042 Chronic combined systolic (congestive) and diastolic (congestive) heart failure: Secondary | ICD-10-CM | POA: Diagnosis not present

## 2022-05-12 DIAGNOSIS — I132 Hypertensive heart and chronic kidney disease with heart failure and with stage 5 chronic kidney disease, or end stage renal disease: Secondary | ICD-10-CM | POA: Diagnosis not present

## 2022-05-12 DIAGNOSIS — D649 Anemia, unspecified: Secondary | ICD-10-CM | POA: Diagnosis not present

## 2022-05-12 DIAGNOSIS — Z515 Encounter for palliative care: Secondary | ICD-10-CM | POA: Diagnosis not present

## 2022-05-12 LAB — RENAL FUNCTION PANEL
Albumin: 2.9 g/dL — ABNORMAL LOW (ref 3.5–5.0)
Anion gap: 9 (ref 5–15)
BUN: 33 mg/dL — ABNORMAL HIGH (ref 6–20)
CO2: 25 mmol/L (ref 22–32)
Calcium: 8.3 mg/dL — ABNORMAL LOW (ref 8.9–10.3)
Chloride: 101 mmol/L (ref 98–111)
Creatinine, Ser: 6.41 mg/dL — ABNORMAL HIGH (ref 0.61–1.24)
GFR, Estimated: 9 mL/min — ABNORMAL LOW (ref >60)
Glucose, Bld: 92 mg/dL (ref 70–99)
Phosphorus: 4.6 mg/dL (ref 2.5–4.6)
Potassium: 4.3 mmol/L (ref 3.5–5.1)
Sodium: 135 mmol/L (ref 135–145)

## 2022-05-12 LAB — CBC
HCT: 25.2 % — ABNORMAL LOW (ref 39.0–52.0)
Hemoglobin: 8 g/dL — ABNORMAL LOW (ref 13.0–17.0)
MCH: 31.4 pg (ref 26.0–34.0)
MCHC: 31.7 g/dL (ref 30.0–36.0)
MCV: 98.8 fL (ref 80.0–100.0)
Platelets: 184 10*3/uL (ref 150–400)
RBC: 2.55 MIL/uL — ABNORMAL LOW (ref 4.22–5.81)
RDW: 15.1 % (ref 11.5–15.5)
WBC: 4.7 10*3/uL (ref 4.0–10.5)
nRBC: 0 % (ref 0.0–0.2)

## 2022-05-12 MED ORDER — HEPARIN SODIUM (PORCINE) 1000 UNIT/ML IJ SOLN
INTRAMUSCULAR | Status: AC
  Administered 2022-05-12: 1000 [IU]
  Filled 2022-05-12: qty 1

## 2022-05-12 MED ORDER — ISONIAZID 300 MG PO TABS
300.0000 mg | ORAL_TABLET | Freq: Every day | ORAL | 5 refills | Status: DC
  Filled 2022-05-12: qty 30, 30d supply, fill #0

## 2022-05-12 MED ORDER — HYDRALAZINE HCL 50 MG PO TABS
100.0000 mg | ORAL_TABLET | Freq: Three times a day (TID) | ORAL | 0 refills | Status: DC
  Filled 2022-05-12: qty 180, 30d supply, fill #0

## 2022-05-12 MED ORDER — HEPARIN SODIUM (PORCINE) 1000 UNIT/ML IJ SOLN: 2500.0000 [IU] | Freq: Once | INTRAMUSCULAR | Status: DC

## 2022-05-12 MED ORDER — ISOSORBIDE MONONITRATE ER 60 MG PO TB24
60.0000 mg | ORAL_TABLET | Freq: Every day | ORAL | 0 refills | Status: DC
  Filled 2022-05-12: qty 30, 30d supply, fill #0

## 2022-05-12 MED ORDER — HEPARIN SODIUM (PORCINE) 1000 UNIT/ML IJ SOLN
INTRAMUSCULAR | Status: AC
  Administered 2022-05-12: 2500 [IU] via INTRAVENOUS_CENTRAL
  Filled 2022-05-12: qty 3

## 2022-05-12 MED ORDER — MIRTAZAPINE 7.5 MG PO TABS
7.5000 mg | ORAL_TABLET | Freq: Every day | ORAL | 0 refills | Status: DC
  Filled 2022-05-12: qty 30, 30d supply, fill #0

## 2022-05-12 MED ORDER — MIRTAZAPINE 7.5 MG PO TABS: 7.5000 mg | ORAL_TABLET | Freq: Every day | ORAL | Status: DC

## 2022-05-12 MED ORDER — HEPARIN SODIUM (PORCINE) 1000 UNIT/ML DIALYSIS: 2500.0000 [IU] | INTRAMUSCULAR | Status: DC | PRN

## 2022-05-12 MED ORDER — ATORVASTATIN CALCIUM 40 MG PO TABS
40.0000 mg | ORAL_TABLET | Freq: Every day | ORAL | 0 refills | Status: DC
  Filled 2022-05-12: qty 30, 30d supply, fill #0

## 2022-05-12 MED ORDER — CLOPIDOGREL BISULFATE 75 MG PO TABS
75.0000 mg | ORAL_TABLET | Freq: Every day | ORAL | 0 refills | Status: AC
  Filled 2022-05-12: qty 30, 30d supply, fill #0

## 2022-05-12 MED ORDER — CARVEDILOL 25 MG PO TABS
25.0000 mg | ORAL_TABLET | Freq: Two times a day (BID) | ORAL | 0 refills | Status: DC
  Filled 2022-05-12: qty 60, 30d supply, fill #0

## 2022-05-12 NOTE — Progress Notes (Addendum)
Pt accepted at TCU at [facility] on Mon,Tues,Thurs,Fri at 12:00 chair time. Pt can start tomorrow and will need to arrive at 11:30 to complete paperwork prior to treatment. Met with pt at bedside while receiving HD. Discussed above details and provided schedule letter with details noted. Pt agreeable to plan. Update provided to attending, nephrologist,and RN CM. Contacted renal NP with request that orders be sent to clinic for tomorrow. Will add HD arrangements to AVS as well. Pt states that brother can assist with transportation to HD appointments.   Melven Sartorius Renal Navigator [phone]  Addendum at 1:50 pm: Spoke to [place] at [facility] to make her aware of pt's TB latent diagnosis. Jolayne Haines discussed with MD at clinic and requested that chest x-rays be faxed to clinic. Chest x-rays faxed as requested to [phone].   Addendum at 3:24 pm: Spoke to [place] at [facility]. Fax received and clinic aware pt will d/c today and supposed to start tomorrow.

## 2022-05-12 NOTE — Consult Note (Signed)
   [facility] Inpatient Consult   05/12/2022  Anfernee Peschke 1965-06-16 [id number]   Managed Medicaid: [employer]  Referral to MM team for post hospital follow up is requested due to new HD and Acute CVA, post hospital support.  Natividad Brood, RN BSN [place]  [phone] business mobile phone Toll free office [phone]  *[place]  [phone] Fax number: [phone] [email or web address]

## 2022-05-12 NOTE — Plan of Care (Signed)
  Problem: Activity: Goal: Ability to tolerate increased activity will improve Outcome: Progressing   Problem: Health Behavior/Discharge Planning: Goal: Ability to safely manage health-related needs after discharge will improve Outcome: Progressing   Problem: Education: Goal: Knowledge of General Education information will improve Description: Including pain rating scale, medication(s)/side effects and non-pharmacologic comfort measures Outcome: Progressing   Problem: Health Behavior/Discharge Planning: Goal: Ability to manage health-related needs will improve Outcome: Progressing   Problem: Clinical Measurements: Goal: Ability to maintain clinical measurements within normal limits will improve Outcome: Progressing Goal: Will remain free from infection Outcome: Progressing Goal: Diagnostic test results will improve Outcome: Progressing Goal: Respiratory complications will improve Outcome: Progressing Goal: Cardiovascular complication will be avoided Outcome: Progressing   Problem: Pain Managment: Goal: General experience of comfort will improve Outcome: Progressing   Problem: Safety: Goal: Ability to remain free from injury will improve Outcome: Progressing

## 2022-05-12 NOTE — Discharge Summary (Signed)
Physician Discharge Summary  Cody Webster JAS:[id number] DOB: Oct 26, 1965 DOA: 05/04/2022  PCP: Kerin Perna, NP  Admit date: 05/04/2022 Discharge date: 05/12/2022 30 Day Unplanned Readmission Risk Score    Flowsheet Row ED to Hosp-Admission (Current) from 05/04/2022 in [place]  30 Day Unplanned Readmission Risk Score (%) 27.34 Filed at 05/12/2022 1200       This score is the patient's risk of an unplanned readmission within 30 days of being discharged (0 -100%). The score is based on dignosis, age, lab data, medications, orders, and past utilization.   Low:  0-14.9   Medium: 15-21.9   High: 22-29.9   Extreme: 30 and above          Admitted From: Home Disposition: Home  Recommendations for Outpatient Follow-up:  Follow up with PCP in 1-2 weeks Please obtain BMP/CBC in one week Follow-up with outpatient dialysis Follow-up with cardiology in 3 to 4 weeks/as scheduled, they shall call Continue both aspirin and Plavix, DAPT until seen by cardiology and further plans per cardiology. Please follow up with your PCP on the following pending results: Unresulted Labs (From admission, onward)     Start     Ordered   05/05/22 0500  Renal function panel  Daily,   R      05/04/22 1638              Home Health: None Equipment/Devices: None  Discharge Condition: Stable CODE STATUS: DNR Diet recommendation: Low-sodium/cardiac  Subjective: Seen and examined in dialysis unit.  He has no complaints.  He is excited to go home today.  Brief/Interim Summary: Cody Webster is a 56 y.o. male with medical history significant of CKD5, HTN, GERD, latent TB. Presented with chest pain. He reports that he was on medication for latent TB, but he stopped because he thought it was making him sick. No other complaint.  Admitted to hospital service at [facility].  Cardiology and nephrology consulted.  Nephrology recommended hemodialysis, he  had nonfunctional LUE AVF but patient refused dialysis in the beginning but eventually agreed and he was transferred to [facility] for that reason.  Cardiology also followed him.  On the evening of 05/05/2022, patient was noted to have dysarthria and facial droop and code stroke was called.  MRI shows acute infarct in right posterior pons and subcortical left cerebral hemisphere. Neurology consulted.  Further details below.   Chest pain: Troponin elevated but flat.  Echo shows slightly reduced LVEF, previously it was 50 to 55% and now it is 45 to 50%.  Also has regional wall motion abnormality with hypokinesis.  Grade 2 diastolic dysfunction.  Cardiology on board and initially plans were to do cardiac catheterization during hospitalization however due to him having a stroke, cardiology decided to manage medically and they will likely plan outpatient cardiac cath.  He needs to follow-up with them.    CKD stage V/hyperphosphatemia/hyperkalemia/metabolic acidosis/uremia: Nephrology on board, patient has nonfunctioning LUE AVF.  Patient underwent temporary right IJ HD catheter placed by PCCM on 05/07/2022 and he was started on hemodialysis . Per vascular surgery , His duplex of his first stage left basilic vein transposition shows that the fistula is small and therefore he is not a candidate for a second stage basilic vein transposition. This fistula has been in for 2 years. He will need new access.  They are planning to do this as outpatient, IR was consulted and patient underwent placement of tunneled catheter on 05/10/2022.  Nephrology on board for dialysis.  Outpatient dialysis arrangements have been made and patient has been cleared for discharge and he will resume his dialysis tomorrow and his schedule will be MWF.   Acute ischemic stroke/essential hypertension: MRI report shows acute ischemic stroke.  He is only on aspirin and now also on Plavix.  He is on atorvastatin as well. Neurology recommends 30-day  CardioNet monitor as outpatient to rule out A-fib.  Initially permissive hypertension was allowed and then he was started on hydralazine 100 mg 3 times daily, Imdur 60 mg p.o. daily and Coreg 25 mg p.o. twice daily, and amlodipine 5 mg.  Blood pressure was still elevated so amlodipine was increased to 10 mg however today's blood pressure is low normal so at the time of discharge, amlodipine is being discontinued but rest of the medications are being prescribed to him.  Neurology recommended to continue DAPT until follow-up with cardiology as outpatient and cardiology to decide further on duration.   Anemia of chronic disease: Patient's hemoglobin dropped to 7 on 05/04/2022 and he received 1 unit of PRBC transfusion.  Hemoglobin improved.   Latent TB: Patient stopped taking medications and he is refusing again.  He has the capacity to make decisions for himself.  I have prescribed him this medication with the hope that he will take that.  Discharge plan was discussed with patient and/or family member and they verbalized understanding and agreed with it.  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 Thanks.      Allergies as of  05/12/2022       Reactions   Morphine And Related Shortness Of Breath, Other (See Comments)   Hallucination  Tolerates Norco/Vicodin        Medication List     STOP taking these medications    cloNIDine 0.1 MG tablet Commonly known as: CATAPRES   diclofenac Sodium 1 % Gel Commonly known as: Voltaren       TAKE these medications    acetaminophen 500 MG tablet Commonly known as: TYLENOL Take 1,000 mg by mouth every 6 (six) hours as needed (for pain/headaches.).   aspirin EC 81 MG tablet Take 81 mg by mouth every evening.   atorvastatin 40 MG tablet Commonly known as: LIPITOR Take 1 tablet (40 mg total) by mouth daily.   carvedilol 25 MG tablet Commonly known as: COREG Take 1 tablet (25 mg total) by mouth 2 (two) times daily with a meal.   cetirizine 10 MG tablet Commonly known as: ZYRTEC TAKE 1 TABLET (10 MG TOTAL) BY MOUTH DAILY.   clopidogrel 75 MG tablet Commonly known as: PLAVIX Take 1 tablet (75 mg total) by mouth daily.  fluticasone 50 MCG/ACT nasal spray Commonly known as: FLONASE Place 2 sprays into both nostrils daily. What changed:  when to take this reasons to take this   gabapentin 300 MG capsule Commonly known as: NEURONTIN Take 1 capsule (300 mg total) by mouth 2 (two) times daily. What changed: when to take this   guaiFENesin 600 MG 12 hr tablet Commonly known as: MUCINEX Take 1 tablet (600 mg total) by mouth 2 (two) times daily as needed for to loosen phlegm.   hydrALAZINE 50 MG tablet Commonly known as: APRESOLINE Take 2 tablets (100 mg total) by mouth 3 (three) times daily.   isoniazid 300 MG tablet Commonly known as: NYDRAZID Take 1 tablet (300 mg total) by mouth daily.   isosorbide mononitrate 60 MG 24 hr tablet Commonly known as: IMDUR Take 1 tablet (60 mg total) by mouth daily.   mirtazapine 7.5 MG tablet Commonly known as: REMERON Take 1 tablet (7.5 mg total) by mouth at bedtime.   nitroGLYCERIN 0.4 MG SL  tablet Commonly known as: NITROSTAT Place 1 tablet (0.4 mg total) under the tongue every 5 (five) minutes as needed for chest pain (CP or SOB).   oxyCODONE-acetaminophen 10-325 MG tablet Commonly known as: PERCOCET Take 1 tablet by mouth every 6 (six) hours as needed for pain.   pyridOXINE 50 MG tablet Commonly known as: VITAMIN B6 Take 1 tablet (50 mg total) by mouth daily.   traZODone 100 MG tablet Commonly known as: DESYREL TAKE 1/2 TO 1 TABLET BY MOUTH AT BEDTIME AS NEEDED FOR SLEEP. What changed:  reasons to take this additional instructions        Follow-up Information     [facility]. Schedule an appointment as soon as possible for a visit in 1 month(s).   Specialty: Neurology Why: stroke clinic Contact information: [address] [place], [facility]. Go on 05/13/2022.   Why: Transitional Care Unit at [facility]- Days are Monday,Tuesday, Thursday, Friday with 12:00 chair time.  On Friday, please arrive at 11:30 am to complete paperwork prior to first treatment.  Clinic will be closed Monday, Jan 1. Please come on Sunday, Dec 31 to complete treatment. Contact information: [place] [address] [phone]         Kerin Perna, NP Follow up in 1 week(s).   Specialty: Internal Medicine Contact information: [place] [address] [phone]                Allergies  Allergen Reactions   Morphine And Related Shortness Of Breath and Other (See Comments)    Hallucination  Tolerates Norco/Vicodin    Consultations: Nephrology, vascular surgery, cardiology, neurology, IR   Procedures/Studies: IR Fluoro Guide CV Line Right  Result Date: 05/10/2022 CLINICAL DATA:  Renal failure, needs durable long-term access for hemodialysis EXAM: TUNNELED HEMODIALYSIS CATHETER PLACEMENT WITH ULTRASOUND AND FLUOROSCOPIC GUIDANCE  TECHNIQUE: The procedure, risks , benefits, and alternatives were explained to the patient. Questions regarding the procedure were encouraged and answered. The patient understands and consents to the procedure. As antibiotic prophylaxis, cefazolin 2 g was ordered pre-procedure and administered intravenously within one hour of incision.patency of the right IJ vein was confirmed with ultrasound with image documentation. An appropriate skin site was determined. Region was prepped using maximum barrier technique including cap and mask, sterile gown, sterile gloves, large sterile sheet, and Chlorhexidine as cutaneous antisepsis. The region was  infiltrated locally with 1% lidocaine. Intravenous Fentanyl 43mg and Versed '1mg'$  were administered as conscious sedation during continuous monitoring of the patient's level of consciousness and physiological / cardiorespiratory status by the radiology RN, with a total moderate sedation time of 14 minutes. Under real-time ultrasound guidance, the right IJ vein was accessed with a 21 gauge micropuncture needle; the needle tip within the vein was confirmed with ultrasound image documentation. Needle exchanged over the 018 guidewire for transitional dilator, which allowed advancement of a Benson wire into the IVC. Over this, an MPA catheter was advanced. A Palindrome 19 hemodialysis catheter was tunneled from the right anterior chest wall approach to the right IJ dermatotomy site. The MPA catheter was exchanged over an Amplatz wire for serial vascular dilators which allow placement of a peel-away sheath, through which the catheter was advanced under intermittent fluoroscopy, positioned with its tips in the proximal and midright atrium. Spot chest radiograph confirms good catheter position. No pneumothorax. Catheter was flushed and primed per protocol. Catheter secured externally with O Prolene sutures. The right IJ dermatotomy site was closed with Dermabond. COMPLICATIONS:  COMPLICATIONS None immediate FLUOROSCOPY: Radiation Exposure Index (as provided by the fluoroscopic device): 1 mGy air Kerma COMPARISON:  None Available. IMPRESSION: 1. Technically successful placement of tunneled right IJ hemodialysis catheter with ultrasound and fluoroscopic guidance. Ready for routine use. ACCESS: Remains approachable for percutaneous intervention as needed. Electronically Signed   By: DLucrezia EuropeM.D.   On: 05/10/2022 13:32   IR [place] Vasc Access Right  Result Date: 05/10/2022 CLINICAL DATA:  Renal failure, needs durable long-term access for hemodialysis EXAM: TUNNELED HEMODIALYSIS CATHETER PLACEMENT WITH ULTRASOUND AND FLUOROSCOPIC GUIDANCE TECHNIQUE: The procedure, risks , benefits, and alternatives were explained to the patient. Questions regarding the procedure were encouraged and answered. The patient understands and consents to the procedure. As antibiotic prophylaxis, cefazolin 2 g was ordered pre-procedure and administered intravenously within one hour of incision.patency of the right IJ vein was confirmed with ultrasound with image documentation. An appropriate skin site was determined. Region was prepped using maximum barrier technique including cap and mask, sterile gown, sterile gloves, large sterile sheet, and Chlorhexidine as cutaneous antisepsis. The region was infiltrated locally with 1% lidocaine. Intravenous Fentanyl 262m and Versed '1mg'$  were administered as conscious sedation during continuous monitoring of the patient's level of consciousness and physiological / cardiorespiratory status by the radiology RN, with a total moderate sedation time of 14 minutes. Under real-time ultrasound guidance, the right IJ vein was accessed with a 21 gauge micropuncture needle; the needle tip within the vein was confirmed with ultrasound image documentation. Needle exchanged over the 018 guidewire for transitional dilator, which allowed advancement of a Benson wire into the IVC. Over  this, an MPA catheter was advanced. A Palindrome 19 hemodialysis catheter was tunneled from the right anterior chest wall approach to the right IJ dermatotomy site. The MPA catheter was exchanged over an Amplatz wire for serial vascular dilators which allow placement of a peel-away sheath, through which the catheter was advanced under intermittent fluoroscopy, positioned with its tips in the proximal and midright atrium. Spot chest radiograph confirms good catheter position. No pneumothorax. Catheter was flushed and primed per protocol. Catheter secured externally with O Prolene sutures. The right IJ dermatotomy site was closed with Dermabond. COMPLICATIONS: COMPLICATIONS None immediate FLUOROSCOPY: Radiation Exposure Index (as provided by the fluoroscopic device): 1 mGy air Kerma COMPARISON:  None Available. IMPRESSION: 1. Technically successful placement of tunneled right IJ hemodialysis catheter with ultrasound  and fluoroscopic guidance. Ready for routine use. ACCESS: Remains approachable for percutaneous intervention as needed. Electronically Signed   By: Lucrezia Europe M.D.   On: 05/10/2022 13:32   VAS [place] UPPER EXTREMITY ARTERIAL DUPLEX  Result Date: 05/10/2022  UPPER EXTREMITY DUPLEX STUDY Patient Name:  Cody Webster  Date of Exam:   05/09/2022 Medical Rec #: [id number]      Accession #:    [id number] Date of Birth: 1965-06-24      Patient Gender: M Patient Age:   61 years Exam Location:  [facility] Procedure:      VAS [place] UPPER EXTREMITY ARTERIAL DUPLEX Referring Phys: Harrell Gave DICKSON --------------------------------------------------------------------------------  Indications: Diminished radial pulses on clinical exam, patient with first stage              left basilic vein transposition.  Performing Technologist: Darlin Coco RDMS, RVT  Examination Guidelines: A complete evaluation includes B-mode imaging, spectral Doppler, color Doppler, and power Doppler as needed of all accessible portions of  each vessel. Bilateral testing is considered an integral part of a complete examination. Limited examinations for reoccurring indications may be performed as noted.  Right Doppler Findings: +---------------+----------+---------+--------+--------+ Site           PSV (cm/s)Waveform StenosisComments +---------------+----------+---------+--------+--------+ Subclavian Prox148       triphasic                 +---------------+----------+---------+--------+--------+ Subclavian Dist133       triphasic                 +---------------+----------+---------+--------+--------+ Axillary       169       triphasic                 +---------------+----------+---------+--------+--------+ Brachial Prox  160       triphasic                 +---------------+----------+---------+--------+--------+ Brachial Mid   117       triphasic                 +---------------+----------+---------+--------+--------+ Brachial Dist  116       triphasic                 +---------------+----------+---------+--------+--------+ Radial Prox    107       triphasic                 +---------------+----------+---------+--------+--------+ Radial Mid     100       triphasic                 +---------------+----------+---------+--------+--------+ Radial Dist    143       triphasic                 +---------------+----------+---------+--------+--------+ Ulnar Prox     100       triphasic                 +---------------+----------+---------+--------+--------+ Ulnar Mid      88        triphasic                 +---------------+----------+---------+--------+--------+ Ulnar Dist     107       triphasic                 +---------------+----------+---------+--------+--------+ Palmar Arch    131       triphasic                 +---------------+----------+---------+--------+--------+  Left Doppler Findings: +---------------+----------+---------+-------------+---------------------------+  Site           PSV (cm/s)Waveform Stenosis     Comments                    +---------------+----------+---------+-------------+---------------------------+ Subclavian Prox362                >50% stenosisHighly turbulent, AVF                                                      waveform, ratio >2.0 as                                                    compared to distal          +---------------+----------+---------+-------------+---------------------------+ Subclavian Mid 265                             Turbulent, AVF waveform     +---------------+----------+---------+-------------+---------------------------+ Subclavian Dist176                             Turbulet, AVF waveform,                                                    Dampened WF                 +---------------+----------+---------+-------------+---------------------------+ Axillary       203                             AVF waveform                +---------------+----------+---------+-------------+---------------------------+ Brachial Prox  219                             AVF waveform                +---------------+----------+---------+-------------+---------------------------+ Brachial Mid   244                             AVF waveform                +---------------+----------+---------+-------------+---------------------------+ Brachial Dist  51        biphasic                                          +---------------+----------+---------+-------------+---------------------------+ Radial Prox    50        biphasic                                          +---------------+----------+---------+-------------+---------------------------+ Radial Mid     70  biphasic                                          +---------------+----------+---------+-------------+---------------------------+ Radial Dist    83        triphasic                                          +---------------+----------+---------+-------------+---------------------------+ Ulnar Prox     63        biphasic                                          +---------------+----------+---------+-------------+---------------------------+ Ulnar Mid      83        biphasic                                          +---------------+----------+---------+-------------+---------------------------+ Ulnar Dist     51        biphasic                                          +---------------+----------+---------+-------------+---------------------------+   Summary:  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 *See table(s) above for measurements and observations. Electronically signed by [name] MD on 05/10/2022 at 4:34:01 AM.    Final    VAS [place] UPPER EXT VEIN MAPPING (PRE-OP AVF)  Result Date: 05/10/2022 UPPER EXTREMITY VEIN MAPPING Patient Name:  Cody Webster  Date of Exam:   05/09/2022 Medical Rec #: [id number]      Accession #:    [id number] Date of Birth: 1966-05-04      Patient Gender: M Patient Age:   61 years Exam Location:  [facility] Procedure:      VAS [place] UPPER EXT VEIN MAPPING (PRE-OP AVF) Referring Phys: Harrell Gave DICKSON --------------------------------------------------------------------------------  Indications: Pre-access. History: ESRD, history of LEFT basilic vein AVF creation 03-23-2020.  Comparison Study: 03-10-2020 Prior upper extremity vein mapping. Performing Technologist: Darlin Coco RDMS, RVT  Examination Guidelines: A complete evaluation includes B-mode imaging, spectral Doppler, color Doppler, and power Doppler as needed of all accessible portions of each vessel. Bilateral testing is considered an integral part of a complete examination. Limited examinations for reoccurring indications may be performed as noted. +-----------------+-------------+----------+---------+ Right Cephalic   Diameter  (cm)Depth (cm)Findings  +-----------------+-------------+----------+---------+ Shoulder             0.28        0.27             +-----------------+-------------+----------+---------+ Prox upper arm       0.30        0.23             +-----------------+-------------+----------+---------+ Mid upper arm        0.24        0.27             +-----------------+-------------+----------+---------+ Dist upper arm       0.25  0.17             +-----------------+-------------+----------+---------+ Antecubital fossa    0.31        0.20   branching +-----------------+-------------+----------+---------+ Prox forearm         0.21        0.20             +-----------------+-------------+----------+---------+ Mid forearm          0.15        0.21   branching +-----------------+-------------+----------+---------+ Dist forearm         0.13        0.14             +-----------------+-------------+----------+---------+ +-----------------+-------------+----------+---------+ Right Basilic    Diameter (cm)Depth (cm)Findings  +-----------------+-------------+----------+---------+ Prox upper arm       0.32                         +-----------------+-------------+----------+---------+ Mid upper arm        0.37                         +-----------------+-------------+----------+---------+ Dist upper arm       0.32               branching +-----------------+-------------+----------+---------+ Antecubital fossa    0.30                         +-----------------+-------------+----------+---------+ Prox forearm         0.23                         +-----------------+-------------+----------+---------+ Mid forearm          0.25                         +-----------------+-------------+----------+---------+ Distal forearm       0.22               branching +-----------------+-------------+----------+---------+ *See table(s) above for measurements and  observations.  Diagnosing physician: [name] MD Electronically signed by [name] MD on 05/10/2022 at 4:32:09 AM.    Final    MR ANGIO HEAD WO CONTRAST  Result Date: 05/08/2022 CLINICAL DATA:  Follow-up examination for stroke. EXAM: MRA HEAD WITHOUT CONTRAST TECHNIQUE: Angiographic images of the Circle of Willis were acquired using MRA technique without intravenous contrast. COMPARISON:  Comparison made with prior brain MRI from 05/06/2022. FINDINGS: Anterior circulation: Examination moderately degraded by motion artifact, limiting assessment. Visualized distal cervical segments of both internal carotid arteries are widely patent with antegrade flow. Petrous, cavernous, and supraclinoid segments patent without significant stenosis or other visible abnormality. A1 segments grossly patent bilaterally. Left A1 dominant, with a hypoplastic right A1. Grossly normal anterior communicating artery complex. Anterior cerebral arteries patent to their distal aspects without convincing stenosis. Both M1 segments are widely patent. No visible proximal MCA branch occlusion or convincing stenosis. Distal MCA branches perfused and fairly symmetric. Posterior circulation: Both V4 segments patent without stenosis. Right vertebral artery dominant. Left PICA patent. Right PICA not seen. Basilar patent without stenosis. Superior cerebral arteries patent bilaterally. Both PCA supplied via the basilar as well as prominent bilateral posterior communicating arteries. PCAs patent to their distal aspects without visible stenosis. Anatomic variants: None significant. Other: No visible intracranial aneurysm. IMPRESSION: 1. Motion degraded exam. 2. Negative intracranial MRA. No large vessel occlusion.  No visible hemodynamically significant or correctable stenosis. Electronically Signed   By: Jeannine Boga M.D.   On: 05/08/2022 22:14   VAS US DUPLEX DIALYSIS ACCESS (AVF, AVG)  Result Date:  05/08/2022 DIALYSIS ACCESS Patient Name:  Cody Webster  Date of Exam:   05/08/2022 Medical Rec #: [id number]      Accession #:    [id number] Date of Birth: December 18, 1965      Patient Gender: M Patient Age:   12 years Exam Location:  [facility] Procedure:      VAS US DUPLEX DIALYSIS ACCESS (AVF, AVG) Referring Phys: Harrell Gave DICKSON --------------------------------------------------------------------------------  Reason for Exam: Evaluation prior to placement of dialysis access. Access Site: Left Upper Extremity. Access Type: Basilic vein transposition. History: 76/05/9507 - LEFT BASILIC VEIN FISTULA CREATION. Comparison Study: No prior studies. Performing Technologist: Oliver Hum RVT  Examination Guidelines: A complete evaluation includes B-mode imaging, spectral Doppler, color Doppler, and power Doppler as needed of all accessible portions of each vessel. Unilateral testing is considered an integral part of a complete examination. Limited examinations for reoccurring indications may be performed as noted.  Findings:  +------------+----------+-------------+----------+--------+ OUTFLOW VEINPSV (cm/s)Diameter (cm)Depth (cm)Describe +------------+----------+-------------+----------+--------+ Prox UA                   0.48                        +------------+----------+-------------+----------+--------+ Mid UA                    0.45                        +------------+----------+-------------+----------+--------+ Dist UA                   0.68                Branch  +------------+----------+-------------+----------+--------+ An area of stenosis is noted in the distal segment of the basilic vein near the anastimosis. Pre 0.76 cm, at 0.37 cm, post 0.72 cm.  Diagnosing physician: [name] MD Electronically signed by [name] MD on 05/08/2022 at 6:19:24 PM.    --------------------------------------------------------------------------------   Final    DG CHEST  PORT 1 VIEW  Result Date: 05/07/2022 CLINICAL DATA:  Status post insertion of hemodialysis catheter. EXAM: PORTABLE CHEST 1 VIEW COMPARISON:  Chest radiograph 05/04/2022 and earlier FINDINGS: Interval placement of right IJ central venous catheter with the distal tip of the level of the mid SVC. Stable enlarged cardiomediastinal silhouette which could be exaggerated secondary to portable technique and patient positioning. Aortic calcifications. No focal consolidation, pleural effusion, or pneumothorax. IMPRESSION: Interval placement of right IJ central venous catheter with the distal tip of the level of the mid SVC. Electronically Signed   By: Ileana Roup M.D.   On: 05/07/2022 14:45   VAS [place] TRANSCRANIAL DOPPLER  Result Date: 05/07/2022  Transcranial Doppler Patient Name:  Cody Webster  Date of Exam:   05/06/2022 Medical Rec #: [id number]      Accession #:    [id number] Date of Birth: 1965/09/19      Patient Gender: M Patient Age:   1 years Exam Location:  [facility] Procedure:      VAS [place] TRANSCRANIAL DOPPLER Referring Phys: Elwin Sleight DE LA TORRE --------------------------------------------------------------------------------  Indications: Stroke. History: HTN, HLD, SMK, DM, CHF, CKD, CVA. 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 Comparison Study: No previous exams Performing Technologist: Jody Hill RVT, RDMS  Examination Guidelines: A complete evaluation includes B-mode imaging, spectral Doppler, color Doppler, and power Doppler as needed of all accessible portions of each vessel. Bilateral testing is considered an integral part of a complete examination. Limited examinations for reoccurring indications may be performed as noted.  +----------+---------------+----------+-----------+-------------+ RIGHT TCD Right VM (cm/s)Depth (cm)Pulsatility    Comment    +----------+---------------+----------+-----------+-------------+ MCA                                           not insonated +----------+---------------+----------+-----------+-------------+ ACA                                           not insonated +----------+---------------+----------+-----------+-------------+ Term ICA                                      not insonated +----------+---------------+----------+-----------+-------------+ PCA P1                                        not insonated +----------+---------------+----------+-----------+-------------+ Opthalmic                                     not insonated +----------+---------------+----------+-----------+-------------+ ICA siphon                                    not insonated +----------+---------------+----------+-----------+-------------+ Vertebral       -70                   1.11                  +----------+---------------+----------+-----------+-------------+  +----------+--------------+----------+-----------+-------------+ LEFT TCD  Left VM (cm/s)Depth (cm)Pulsatility   Comment    +----------+--------------+----------+-----------+-------------+ MCA             29                   0.98                  +----------+--------------+----------+-----------+-------------+ ACA            -24                   0.88                  +----------+--------------+----------+-----------+-------------+ Term ICA                                     not insonated +----------+--------------+----------+-----------+-------------+ PCA P1                                       not insonated +----------+--------------+----------+-----------+-------------+ Opthalmic       14  0.88                  +----------+--------------+----------+-----------+-------------+ ICA siphon      21                   2.00                   +----------+--------------+----------+-----------+-------------+ Vertebral      -31                   1.06                  +----------+--------------+----------+-----------+-------------+ Distal ICA                                   not insonated +----------+--------------+----------+-----------+-------------+  +------------+-------+-------+             VM cm/sComment +------------+-------+-------+ Prox Basilar  -77   1.12   +------------+-------+-------+ Dist Basilar  -75   1.15   +------------+-------+-------+ Summary:  Absent right temporal and poor biorbital and left temporalwindows limit evaluation of anterior circulation vessels.Mildly elevated mean flow velocities in bilateral vertebral and basilar arteries of unc;lear significance. *See table(s) above for TCD measurements and observations.  Diagnosing physician: Antony Contras MD Electronically signed by Antony Contras MD on 05/07/2022 at 9:14:15 AM.    Final    VAS US CAROTID  Result Date: 05/06/2022 Carotid Arterial Duplex Study Patient Name:  Cody Webster  Date of Exam:   05/06/2022 Medical Rec #: [id number]      Accession #:    [id number] Date of Birth: July 06, 1965      Patient Gender: M Patient Age:   62 years Exam Location:  [facility] Procedure:      VAS US CAROTID Referring Phys: Elwin Sleight DE LA TORRE --------------------------------------------------------------------------------  Indications:   CVA. Risk Factors:  Hypertension, hyperlipidemia, Diabetes, current smoker, prior                CVA. Other Factors: CHF, CKD. Limitations    Today's exam was limited due to heavy calcification and the                resulting shadowing and patient movement. Performing Technologist: Rogelia Rohrer RVT, RDMS  Examination Guidelines: A complete evaluation includes B-mode imaging, spectral Doppler, color Doppler, and power Doppler as needed of all accessible portions of each vessel. Bilateral testing is considered an integral part of a  complete examination. Limited examinations for reoccurring indications may be performed as noted.  Right Carotid Findings: +----------+--------+--------+--------+------------------+---------+           PSV cm/sEDV cm/sStenosisPlaque DescriptionComments  +----------+--------+--------+--------+------------------+---------+ CCA Prox  120     18              calcific          Shadowing +----------+--------+--------+--------+------------------+---------+ CCA Distal148     25              calcific          Shadowing +----------+--------+--------+--------+------------------+---------+ ICA Prox  149     35      40-59%  calcific          Shadowing +----------+--------+--------+--------+------------------+---------+ ICA Mid   122     28              calcific          Shadowing +----------+--------+--------+--------+------------------+---------+ ICA Distal85  19                                          +----------+--------+--------+--------+------------------+---------+ ECA       171                     calcific          shadowing +----------+--------+--------+--------+------------------+---------+ +----------+--------+-------+----------------+-------------------+           PSV cm/sEDV cmsDescribe        Arm Pressure (mmHG) +----------+--------+-------+----------------+-------------------+ [id number]            Multiphasic, WNL                    +----------+--------+-------+----------------+-------------------+ +---------+--------+--+--------+--+---------+ VertebralPSV cm/s75EDV cm/s23Antegrade +---------+--------+--+--------+--+---------+  Left Carotid Findings: +----------+-------+--------+--------+-----------------------+-----------------+           PSV    EDV cm/sStenosisPlaque Description     Comments                    cm/s                                                             +----------+-------+--------+--------+-----------------------+-----------------+ CCA Prox  106    22                                     intimal                                                                   thickening        +----------+-------+--------+--------+-----------------------+-----------------+ CCA Distal113    21              heterogenous and                                                          calcific                                 +----------+-------+--------+--------+-----------------------+-----------------+ ICA Prox  138    39      40-59%  calcific               Shadowing         +----------+-------+--------+--------+-----------------------+-----------------+ ICA Mid   100    24              calcific               Shadowing         +----------+-------+--------+--------+-----------------------+-----------------+ ICA Distal90     23                                                       +----------+-------+--------+--------+-----------------------+-----------------+  ECA       244            >50%    calcific               shadowing         +----------+-------+--------+--------+-----------------------+-----------------+ +----------+--------+--------+--------+-------------------+           PSV cm/sEDV cm/sDescribeArm Pressure (mmHG) +----------+--------+--------+--------+-------------------+ [id number]     63      Stenotic                    +----------+--------+--------+--------+-------------------+ +---------+--------+--+--------+--+---------+ VertebralPSV cm/s76EDV cm/s23Antegrade +---------+--------+--+--------+--+---------+   Summary: Right Carotid: Velocities in the right ICA are consistent with a 40-59%                stenosis. Left Carotid: Velocities in the left ICA are consistent with a 40-59% stenosis.               The ECA appears >50% stenosed. Vertebrals:  Bilateral vertebral arteries demonstrate antegrade  flow. Subclavians: Left subclavian artery was stenotic. Normal flow hemodynamics were              seen in the right subclavian artery. *See table(s) above for measurements and observations.  Electronically signed by [name] MD on 05/06/2022 at 5:56:55 PM.    Final    MR BRAIN WO CONTRAST  Result Date: 05/06/2022 CLINICAL DATA:  Stroke follow-up EXAM: MRI HEAD WITHOUT CONTRAST TECHNIQUE: Multiplanar, multiecho pulse sequences of the brain and surrounding structures were obtained without intravenous contrast. COMPARISON:  Head CT from yesterday FINDINGS: Brain: Subcentimeter areas of restricted diffusion in the subcortical left frontal lobe and temporal occipital region. Small area of restricted diffusion in the posterior right pons. Advanced chronic small vessel ischemia with extensive gliosis and small vessel infarcts affecting the brainstem, deep gray nuclei, and deep white matter tracks. Premature cerebral volume loss. Chronic microhemorrhages with hypertensive pattern. No acute hemorrhage or hydrocephalus.  No masslike finding. Vascular: Major flow voids are preserved Skull and upper cervical spine: Normal marrow signal. 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 Right more than left mastoid opacification with normal nasopharynx. IMPRESSION: 1. Small acute infarcts in the right posterior pons and subcortical left cerebral hemisphere. 2. Background of severe chronic small vessel ischemia. Electronically Signed   By: Jorje Guild M.D.   On: 05/06/2022 05:29   CT HEAD WO CONTRAST (5MM)  Result Date: 05/05/2022 CLINICAL DATA:  Slurred speech EXAM: CT HEAD WITHOUT CONTRAST TECHNIQUE: Contiguous axial images were obtained from the base of the skull through the vertex without intravenous contrast. RADIATION DOSE REDUCTION: This exam was performed according to the departmental dose-optimization program which includes automated exposure control, adjustment of the mA and/or kV according to  patient size and/or use of iterative reconstruction technique. COMPARISON:  01/11/2015 FINDINGS: Brain: No evidence of acute infarction, hemorrhage, hydrocephalus, extra-axial collection or mass lesion/mass effect. Subcortical white matter and periventricular small vessel ischemic changes. Chronic lacunar infarcts in the right corona radiata. Vascular: Intracranial atherosclerosis. Skull: Normal. Negative for fracture or focal lesion. Sinuses/Orbits: Postprocedural changes involving the right globe. The visualized paranasal sinuses are essentially clear. The mastoid air cells are unopacified. Other: None. IMPRESSION: No evidence of acute intracranial abnormality. Small vessel ischemic changes. Chronic lacunar infarcts in the right corona radiata. Electronically Signed   By: Julian Hy M.D.   On: 05/05/2022 21:17   ECHOCARDIOGRAM COMPLETE  Result Date: 05/05/2022    ECHOCARDIOGRAM REPORT   Patient Name:   Cody Webster Date  of Exam: 05/05/2022 Medical Rec #:  [id number]     Height:       73.0 in Accession #:    [id number]    Weight:       180.0 lb Date of Birth:  1965-06-11     BSA:          2.057 m Patient Age:    32 years      BP:           143/91 mmHg Patient Gender: M             HR:           74 bpm. Exam Location:  Inpatient Procedure: 2D Echo, Color Doppler and Cardiac Doppler Indications:    V76.16 Acute diastolic (congestive) heart failure  History:        Patient has prior history of Echocardiogram examinations, most                 recent 07/17/2020. Risk Factors:Hypertension, Diabetes,                 Dyslipidemia, Sleep Apnea and CKD.  Sonographer:    Raquel Sarna Senior RDCS Referring Phys: [id number] [place]  1. Left ventricular ejection fraction, by estimation, is 45 to 50%. The left ventricle has mildly decreased function. The left ventricle demonstrates regional wall motion abnormalities with basal to mid anterolateral and basal to mid inferior hypokinesis. There is mild concentric  left ventricular hypertrophy. Left ventricular diastolic parameters are consistent with Grade II diastolic dysfunction (pseudonormalization).  2. Right ventricular systolic function is normal. The right ventricular size is mildly enlarged. There is moderately elevated pulmonary artery systolic pressure. The estimated right ventricular systolic pressure is 26.9 mmHg.  3. Left atrial size was severely dilated.  4. Right atrial size was mildly dilated.  5. The mitral valve is degenerative. Mild to moderate mitral valve regurgitation. No evidence of mitral stenosis. Moderate mitral annular calcification.  6. The aortic valve is tricuspid. There is mild calcification of the aortic valve. Aortic valve regurgitation is not visualized. No aortic stenosis is present.  7. The inferior vena cava is dilated in size with <50% respiratory variability, suggesting right atrial pressure of 15 mmHg. FINDINGS  Left Ventricle: Left ventricular ejection fraction, by estimation, is 45 to 50%. The left ventricle has mildly decreased function. The left ventricle demonstrates regional wall motion abnormalities. The left ventricular internal cavity size was normal in size. There is mild concentric left ventricular hypertrophy. Left ventricular diastolic parameters are consistent with Grade II diastolic dysfunction (pseudonormalization). Right Ventricle: The right ventricular size is mildly enlarged. No increase in right ventricular wall thickness. Right ventricular systolic function is normal. There is moderately elevated pulmonary artery systolic pressure. The tricuspid regurgitant velocity is 3.01 m/s, and with an assumed right atrial pressure of 15 mmHg, the estimated right ventricular systolic pressure is 48.5 mmHg. Left Atrium: Left atrial size was severely dilated. Right Atrium: Right atrial size was mildly dilated. Pericardium: Trivial pericardial effusion is present. Mitral Valve: The mitral valve is degenerative in appearance. There  is moderate calcification of the mitral valve leaflet(s). Moderate mitral annular calcification. Mild to moderate mitral valve regurgitation. No evidence of mitral valve stenosis. MV peak gradient, 6.9 mmHg. The mean mitral valve gradient is 3.0 mmHg. Tricuspid Valve: The tricuspid valve is normal in structure. Tricuspid valve regurgitation is trivial. Aortic Valve: The aortic valve is tricuspid. There is mild calcification of the aortic valve. Aortic valve regurgitation  is not visualized. No aortic stenosis is present. Aortic valve mean gradient measures 6.0 mmHg. Aortic valve peak gradient measures 13.2 mmHg. Aortic valve area, by VTI measures 2.16 cm. Pulmonic Valve: The pulmonic valve was normal in structure. Pulmonic valve regurgitation is not visualized. Aorta: The aortic root is normal in size and structure. Venous: The inferior vena cava is dilated in size with less than 50% respiratory variability, suggesting right atrial pressure of 15 mmHg. IAS/Shunts: No atrial level shunt detected by color flow Doppler.  LEFT VENTRICLE PLAX 2D LVIDd:         5.60 cm      Diastology LVIDs:         4.30 cm      LV e' medial:    4.23 cm/s LV PW:         1.50 cm      LV E/e' medial:  26.7 LV IVS:        1.10 cm      LV e' lateral:   7.14 cm/s LVOT diam:     1.80 cm      LV E/e' lateral: 15.8 LV SV:         75 LV SV Index:   37 LVOT Area:     2.54 cm  LV Volumes (MOD) LV vol d, MOD A2C: 173.0 ml LV vol d, MOD A4C: 182.0 ml LV vol s, MOD A2C: 88.1 ml LV vol s, MOD A4C: 84.7 ml LV SV MOD A2C:     84.9 ml LV SV MOD A4C:     182.0 ml LV SV MOD BP:      88.4 ml RIGHT VENTRICLE RV S prime:     12.40 cm/s TAPSE (M-mode): 2.4 cm LEFT ATRIUM              Index        RIGHT ATRIUM           Index LA diam:        5.30 cm  2.58 cm/m   RA Area:     21.80 cm LA Vol (A2C):   121.0 ml 58.81 ml/m  RA Volume:   66.30 ml  32.23 ml/m LA Vol (A4C):   126.0 ml 61.24 ml/m LA Biplane Vol: 127.0 ml 61.73 ml/m  AORTIC VALVE AV Area (Vmax):     1.85 cm AV Area (Vmean):   2.27 cm AV Area (VTI):     2.16 cm AV Vmax:           182.00 cm/s AV Vmean:          112.000 cm/s AV VTI:            0.349 m AV Peak Grad:      13.2 mmHg AV Mean Grad:      6.0 mmHg LVOT Vmax:         132.00 cm/s LVOT Vmean:        99.900 cm/s LVOT VTI:          0.296 m LVOT/AV VTI ratio: 0.85  AORTA Ao Root diam: 3.20 cm Ao Asc diam:  3.00 cm MITRAL VALVE                  TRICUSPID VALVE MV Area (PHT): 4.71 cm       TR Peak grad:   36.2 mmHg MV Area VTI:   2.22 cm       TR Vmax:        301.00 cm/s MV  Peak grad:  6.9 mmHg MV Mean grad:  3.0 mmHg       SHUNTS MV Vmax:       1.31 m/s       Systemic VTI:  0.30 m MV Vmean:      81.0 cm/s      Systemic Diam: 1.80 cm MV Decel Time: 161 msec MR Peak grad:    78.9 mmHg MR Mean grad:    43.0 mmHg MR Vmax:         444.00 cm/s MR Vmean:        320.0 cm/s MR PISA:         3.08 cm MR PISA Eff ROA: 21 mm MR PISA Radius:  0.70 cm MV E velocity: 113.00 cm/s MV A velocity: 40.50 cm/s MV E/A ratio:  2.79 Dalton McleanMD Electronically signed by Franki Monte Signature Date/Time: 05/05/2022/10:58:03 AM    Final    DG Chest 2 View  Result Date: 05/04/2022 CLINICAL DATA:  cp EXAM: CHEST - 2 VIEW COMPARISON:  03/29/2012 FINDINGS: Cardiac silhouette enlarged. No evidence of pneumothorax or pleural effusion. No evidence of pulmonary edema. Aorta is calcified. No osseous abnormalities identified. IMPRESSION: Enlarged cardiac silhouette. Electronically Signed   By: Sammie Bench M.D.   On: 05/04/2022 13:19     Discharge Exam: Vitals:   05/12/22 1223 05/12/22 1240  BP: 129/61 129/61  Pulse: 74 74  Resp: 12 12  Temp: 98.2 F (36.8 C) 98.2 F (36.8 C)  SpO2: 95% 95%   Vitals:   05/12/22 1130 05/12/22 1200 05/12/22 1223 05/12/22 1240  BP: (!) 123/58 (!) 167/65 129/61 129/61  Pulse: 76 74 74 74  Resp: '13 10 12 12  '$ Temp:   98.2 F (36.8 C) 98.2 F (36.8 C)  TempSrc:   Oral Oral  SpO2: 96% 97% 95% 95%  Weight:    72.9 kg  Height:         General: Pt is alert, awake, not in acute distress Cardiovascular: RRR, S1/S2 +, no rubs, no gallops Respiratory: CTA bilaterally, no wheezing, no rhonchi Abdominal: Soft, NT, ND, bowel sounds + Extremities: no edema, no cyanosis    The results of significant diagnostics from this hospitalization (including imaging, microbiology, ancillary and laboratory) are listed below for reference.     Microbiology: Recent Results (from the past 240 hour(s))  Resp panel by RT-PCR (RSV, Flu A&B, Covid) Anterior Nasal Swab     Status: None   Collection Time: 05/04/22  1:02 PM   Specimen: Anterior Nasal Swab  Result Value Ref Range Status   SARS Coronavirus 2 by RT PCR NEGATIVE NEGATIVE Final    Comment: (NOTE) SARS-CoV-2 target nucleic acids are NOT DETECTED.  The SARS-CoV-2 RNA is generally detectable in upper respiratory specimens during the acute phase of infection. The lowest concentration of SARS-CoV-2 viral copies this assay can detect is 138 copies/mL. A negative result does not preclude SARS-Cov-2 infection and should not be used as the sole basis for treatment or other patient management decisions. A negative result may occur with  improper specimen collection/handling, submission of specimen other than nasopharyngeal swab, presence of viral mutation(s) within the areas targeted by this assay, and inadequate number of viral copies(<138 copies/mL). A negative result must be combined with clinical observations, patient history, and epidemiological information. The expected result is Negative.  Fact Sheet for Patients:  EntrepreneurPulse.com.au  Fact Sheet for Healthcare Providers:  IncredibleEmployment.be  This test is no t yet approved or cleared by the [place]  States FDA and  has been authorized for detection and/or diagnosis of SARS-CoV-2 by FDA under an Emergency Use Authorization (EUA). This EUA will remain  in effect (meaning this  test can be used) for the duration of the COVID-19 declaration under Section 564(b)(1) of the Act, 21 U.S.C.section 360bbb-3(b)(1), unless the authorization is terminated  or revoked sooner.       Influenza A by PCR NEGATIVE NEGATIVE Final   Influenza B by PCR NEGATIVE NEGATIVE Final    Comment: (NOTE) The Xpert Xpress SARS-CoV-2/FLU/RSV plus assay is intended as an aid in the diagnosis of influenza from Nasopharyngeal swab specimens and should not be used as a sole basis for treatment. Nasal washings and aspirates are unacceptable for Xpert Xpress SARS-CoV-2/FLU/RSV testing.  Fact Sheet for Patients: EntrepreneurPulse.com.au  Fact Sheet for Healthcare Providers: IncredibleEmployment.be  This test is not yet approved or cleared by the [place] FDA and has been authorized for detection and/or diagnosis of SARS-CoV-2 by FDA under an Emergency Use Authorization (EUA). This EUA will remain in effect (meaning this test can be used) for the duration of the COVID-19 declaration under Section 564(b)(1) of the Act, 21 U.S.C. section 360bbb-3(b)(1), unless the authorization is terminated or revoked.     Resp Syncytial Virus by PCR NEGATIVE NEGATIVE Final    Comment: (NOTE) Fact Sheet for Patients: EntrepreneurPulse.com.au  Fact Sheet for Healthcare Providers: IncredibleEmployment.be  This test is not yet approved or cleared by the [place] FDA and has been authorized for detection and/or diagnosis of SARS-CoV-2 by FDA under an Emergency Use Authorization (EUA). This EUA will remain in effect (meaning this test can be used) for the duration of the COVID-19 declaration under Section 564(b)(1) of the Act, 21 U.S.C. section 360bbb-3(b)(1), unless the authorization is terminated or revoked.  Performed at [facility], [place] [address] [place], [place] [address]   MRSA Next Gen by PCR,  Nasal     Status: None   Collection Time: 05/05/22 11:41 PM   Specimen: Nasal Mucosa; Nasal Swab  Result Value Ref Range Status   MRSA by PCR Next Gen NOT DETECTED NOT DETECTED Final    Comment: (NOTE) The GeneXpert MRSA Assay (FDA approved for NASAL specimens only), is one component of a comprehensive MRSA colonization surveillance program. It is not intended to diagnose MRSA infection nor to guide or monitor treatment for MRSA infections. Test performance is not FDA approved in patients less than 25 years old. Performed at [facility], [place] [address] [place], [place] [address]      Labs: BNP (last 3 results) Recent Labs    05/04/22 1308  BNP 2,778.2*   Basic Metabolic Panel: Recent Labs  Lab 05/08/22 0548 05/09/22 0032 05/10/22 0037 05/11/22 0023 05/12/22 0029  NA 138 139 140 137 135  K 3.7 4.3 4.4 3.6 4.3  CL 103 104 107 99 101  CO2 '24 24 25 29 25  '$ GLUCOSE 74 101* 107* 132* 92  BUN 31* 42* 44* 23* 33*  CREATININE 6.63* 7.75* 8.16* 5.14* 6.41*  CALCIUM 8.5* 8.3* 8.6* 8.6* 8.3*  PHOS 3.0 4.1 4.8* 2.8 4.6   Liver Function Tests: Recent Labs  Lab 05/08/22 0548 05/09/22 0032 05/10/22 0037 05/11/22 0023 05/12/22 0029  ALBUMIN 3.0* 2.8* 3.0* 3.2* 2.9*   No results for input(s): "LIPASE", "AMYLASE" in the last 168 hours. No results for input(s): "AMMONIA" in the last 168 hours. CBC: Recent Labs  Lab 05/06/22 0026 05/10/22 0037 05/12/22 0854  WBC  4.5 5.7 4.7  HGB 8.7* 8.7* 8.0*  HCT 27.4* 27.3* 25.2*  MCV 97.2 98.6 98.8  PLT 178 202 184   Cardiac Enzymes: No results for input(s): "CKTOTAL", "CKMB", "CKMBINDEX", "TROPONINI" in the last 168 hours. BNP: Invalid input(s): "POCBNP" CBG: Recent Labs  Lab 05/05/22 2138  GLUCAP 105*   D-Dimer No results for input(s): "DDIMER" in the last 72 hours. Hgb A1c No results for input(s): "HGBA1C" in the last 72 hours. Lipid Profile No results for input(s): "CHOL", "HDL", "LDLCALC", "TRIG", "CHOLHDL",  "LDLDIRECT" in the last 72 hours. Thyroid function studies No results for input(s): "TSH", "T4TOTAL", "T3FREE", "THYROIDAB" in the last 72 hours.  Invalid input(s): "FREET3" Anemia work up No results for input(s): "VITAMINB12", "FOLATE", "FERRITIN", "TIBC", "IRON", "RETICCTPCT" in the last 72 hours. Urinalysis    Component Value Date/Time   COLORURINE YELLOW 04/23/2016 2336   APPEARANCEUR CLEAR 04/23/2016 2336   LABSPEC 1.012 04/23/2016 2336   PHURINE 5.0 04/23/2016 2336   GLUCOSEU 50 (A) 04/23/2016 2336   HGBUR SMALL (A) 04/23/2016 2336   BILIRUBINUR NEGATIVE 04/23/2016 2336   KETONESUR NEGATIVE 04/23/2016 2336   PROTEINUR >=300 (A) 04/23/2016 2336   UROBILINOGEN 0.2 01/11/2015 1740   NITRITE NEGATIVE 04/23/2016 2336   LEUKOCYTESUR NEGATIVE 04/23/2016 2336   Sepsis Labs Recent Labs  Lab 05/06/22 0026 05/10/22 0037 05/12/22 0854  WBC 4.5 5.7 4.7   Microbiology Recent Results (from the past 240 hour(s))  Resp panel by RT-PCR (RSV, Flu A&B, Covid) Anterior Nasal Swab     Status: None   Collection Time: 05/04/22  1:02 PM   Specimen: Anterior Nasal Swab  Result Value Ref Range Status   SARS Coronavirus 2 by RT PCR NEGATIVE NEGATIVE Final    Comment: (NOTE) SARS-CoV-2 target nucleic acids are NOT DETECTED.  The SARS-CoV-2 RNA is generally detectable in upper respiratory specimens during the acute phase of infection. The lowest concentration of SARS-CoV-2 viral copies this assay can detect is 138 copies/mL. A negative result does not preclude SARS-Cov-2 infection and should not be used as the sole basis for treatment or other patient management decisions. A negative result may occur with  improper specimen collection/handling, submission of specimen other than nasopharyngeal swab, presence of viral mutation(s) within the areas targeted by this assay, and inadequate number of viral copies(<138 copies/mL). A negative result must be combined with clinical observations,  patient history, and epidemiological information. The expected result is Negative.  Fact Sheet for Patients:  EntrepreneurPulse.com.au  Fact Sheet for Healthcare Providers:  IncredibleEmployment.be  This test is no t yet approved or cleared by the [place] FDA and  has been authorized for detection and/or diagnosis of SARS-CoV-2 by FDA under an Emergency Use Authorization (EUA). This EUA will remain  in effect (meaning this test can be used) for the duration of the COVID-19 declaration under Section 564(b)(1) of the Act, 21 U.S.C.section 360bbb-3(b)(1), unless the authorization is terminated  or revoked sooner.       Influenza A by PCR NEGATIVE NEGATIVE Final   Influenza B by PCR NEGATIVE NEGATIVE Final    Comment: (NOTE) The Xpert Xpress SARS-CoV-2/FLU/RSV plus assay is intended as an aid in the diagnosis of influenza from Nasopharyngeal swab specimens and should not be used as a sole basis for treatment. Nasal washings and aspirates are unacceptable for Xpert Xpress SARS-CoV-2/FLU/RSV testing.  Fact Sheet for Patients: EntrepreneurPulse.com.au  Fact Sheet for Healthcare Providers: IncredibleEmployment.be  This test is not yet approved or cleared by the [place] FDA  and has been authorized for detection and/or diagnosis of SARS-CoV-2 by FDA under an Emergency Use Authorization (EUA). This EUA will remain in effect (meaning this test can be used) for the duration of the COVID-19 declaration under Section 564(b)(1) of the Act, 21 U.S.C. section 360bbb-3(b)(1), unless the authorization is terminated or revoked.     Resp Syncytial Virus by PCR NEGATIVE NEGATIVE Final    Comment: (NOTE) Fact Sheet for Patients: EntrepreneurPulse.com.au  Fact Sheet for Healthcare Providers: IncredibleEmployment.be  This test is not yet approved or cleared by the [place] FDA and has been authorized for detection and/or diagnosis of SARS-CoV-2 by FDA under an Emergency Use Authorization (EUA). This EUA will remain in effect (meaning this test can be used) for the duration of the COVID-19 declaration under Section 564(b)(1) of the Act, 21 U.S.C. section 360bbb-3(b)(1), unless the authorization is terminated or revoked.  Performed at [facility], [place] [address], [place], [place] [address]   MRSA Next Gen by PCR, Nasal     Status: None   Collection Time: 05/05/22 11:41 PM   Specimen: Nasal Mucosa; Nasal Swab  Result Value Ref Range Status   MRSA by PCR Next Gen NOT DETECTED NOT DETECTED Final    Comment: (NOTE) The GeneXpert MRSA Assay (FDA approved for NASAL specimens only), is one component of a comprehensive MRSA colonization surveillance program. It is not intended to diagnose MRSA infection nor to guide or monitor treatment for MRSA infections. Test performance is not FDA approved in patients less than 29 years old. Performed at [facility], [place] [address] [place], [place] [address]      Time coordinating discharge: Over 30 minutes  SIGNED:   Darliss Cheney, MD  [facility] 05/12/2022, 1:00 PM *Please note that this is a verbal dictation therefore any spelling or grammatical errors are due to the "[place] One" system interpretation. If 7PM-7AM, please contact night-coverage [email or web address]

## 2022-05-12 NOTE — Progress Notes (Signed)
   05/12/22 1240  Vitals  Temp 98.2 F (36.8 C)  Temp Source Oral  BP 129/61  MAP (mmHg) 79  BP Location Right Arm  BP Method Automatic  Patient Position (if appropriate) Lying  Pulse Rate 74  Pulse Rate Source Monitor  ECG Heart Rate 74  Resp 12  Oxygen Therapy  SpO2 95 %  O2 Device Room Air  Pulse Oximetry Type Continuous   Received patient in bed to unit.  Alert and oriented.  Informed consent signed and in chart.   Treatment initiated: 1102 Treatment completed: 1223  Patient tolerated well.  Transported back to the room  Alert, without acute distress.  Hand-off given to patient's nurse.   Access used: HD Cath Access issues: NA  Total UF removed: 2500 ml Medication(s) given: Heparin 2500 unit bolus, Heparin dwells 3200 units Post HD VS: see above Post HD weight: 72.9kg   [facility]

## 2022-05-12 NOTE — Progress Notes (Signed)
RN provided brother with verbal discharge instructions at car. Paper copy of discharge provided to patient/brother. RN answered all questions. VSS at discharge. IV removed. Patient belongings sent with patient. Patient dc'd via private vehicle with brother.

## 2022-05-12 NOTE — Progress Notes (Signed)
OT Cancellation Note  Patient Details Name: Cody Webster MRN: [id number] DOB: [birth year]   Cancelled Treatment:    Reason Eval/Treat Not Completed: Patient at procedure or test/ unavailable (HD)  [name] 05/12/2022, 8:23 AM Cleta Alberts, OTR/L Acute Rehabilitation Services Office: [phone]
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
## 2022-05-12 NOTE — Progress Notes (Signed)
Patient ID: Cody Webster, male   DOB: Jul 30, 1965, 56 y.o.   MRN: [id number] S: seen in room, having pain attacks after [facility] placement. Getting IV pain meds. No c/o's.   O:BP 135/68   Pulse 74   Temp 98.2 F (36.8 C) (Oral)   Resp 12   Ht '6\' 1"'$  (1.854 m)   Wt 72.9 kg   SpO2 95%   BMI 21.20 kg/m   Exam Gen: NAD CVS: RRR Resp: CTA Abd: +BS, soft, NT/ND Ext: no edema, LUE AVF +T/B  Assessment/Plan: Acute CVA's - small acute infarcts in right posterior pons and subcortical left cerebral hemisphere.  Underlying severe chronic small vessel ischemia. For dc today.  ESRD - presented w/ uremic symptoms. Initially refused HD but then decided to try a short trial of HD to see how he tolerates it.  Peak creat was 10.4. Started HD and get 3 sessions here, supposed to be dc'd home today.  Vascular access - pt had LUE BVT surgery 2 years ago, but AVF it is not yet mature and will require 2nd stage surgery.  Had temp cath placed 05/07/22 by Dr. Ander Slade.  Dr. Scot Dock will arrange for second stage vs new access as outpatient.  SP TDC by IR 12/26.  Chest pain - likely due to demand ischemia from profound anemia, also has many risk factors for CAD Anemia of CKD stage V - agree with blood transfusions prn. Started weekly darbe 60 mcg SQ on 12/21  Chronic combined systolic and diastolic CHF HTN - BP's better, getting po carvedilol, hydralazine, and imdur as at home.  Allowing passive HTN per neuro due to recent ischemic strokes. 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  Started atarax, Sarna prn Disposition - CLIP's to TCU at [facility] on MTuThFri at 12 noon chair time. To start tomorrow.   Kelly Splinter, MD 05/12/2022, 2:02 PM  Recent Labs  Lab 05/10/22 0037 05/11/22 0023 05/12/22 0029 05/12/22 0854  HGB 8.7*  --   --  8.0*  ALBUMIN 3.0* 3.2* 2.9*  --   CALCIUM 8.6* 8.6* 8.3*  --   PHOS 4.8* 2.8 4.6  --   CREATININE 8.16* 5.14* 6.41*  --   K 4.4 3.6 4.3   --      Inpatient medications:  (feeding supplement) PROSource Plus  30 mL Oral BID BM   amLODipine  10 mg Oral Daily   aspirin EC  81 mg Oral QPM   atorvastatin  40 mg Oral Daily   bisacodyl  10 mg Rectal Once   carvedilol  25 mg Oral BID WC   Chlorhexidine Gluconate Cloth  6 each Topical Daily   Chlorhexidine Gluconate Cloth  6 each Topical Q0600   clopidogrel  75 mg Oral Daily   darbepoetin (ARANESP) injection - NON-DIALYSIS  60 mcg Subcutaneous Q Thu-1800   feeding supplement (NEPRO CARB STEADY)  237 mL Oral BID BM   heparin sodium (porcine)  2,500 Units Intravenous Once in dialysis   hydrALAZINE  100 mg Oral TID   hydrOXYzine  10 mg Oral TID   influenza vac split quadrivalent PF  0.5 mL Intramuscular Tomorrow-1000   isosorbide mononitrate  60 mg Oral Daily   loratadine  10 mg Oral Daily   mirtazapine  7.5 mg Oral QHS   multivitamin  1 tablet Oral QHS   nicotine  14 mg Transdermal Daily   pantoprazole  40 mg Oral QHS  polyethylene glycol  17 g Oral Daily   sevelamer carbonate  800 mg Oral TID WC   sodium bicarbonate  650 mg Oral BID    acetaminophen, camphor-menthol, diphenhydrAMINE, fluticasone, hydrALAZINE, HYDROmorphone (DILAUDID) injection, labetalol, nitroGLYCERIN, oxyCODONE-acetaminophen **AND** oxyCODONE, prochlorperazine, traZODone

## 2022-05-12 NOTE — Progress Notes (Addendum)
Palliative Medicine Inpatient Follow Up Note HPI: Raffaele Derise is a 56 y.o. male with medical history significant of CKD5, HTN, GERD, latent TB. Presented with chest pain. He reports that he was on medication for latent TB, but he stopped because he thought it was making him sick. No other complaint.  Admitted to hospital service at [facility].  Cardiology and nephrology consulted.  Nephrology recommended hemodialysis, he has functional LUE AVF but patient refused dialysis in the beginning but eventually agreed and he was transferred to [facility] for that reason.  Cardiology also followed him.  They are recommending cardiac cath at some point in time during this hospitalization.  On the evening of 05/05/2022, patient was noted to have dysarthria and facial droop and code stroke was called.  MRI shows acute infarct in right posterior pons and subcortical left cerebral hemisphere.  Neurology consulted.    Palliative care consulted in the setting of chronic disease burden for further conversations related to goals of care.  Today's Discussion 05/12/2022  *Please note that this is a verbal dictation therefore any spelling or grammatical errors are due to the "[place] One" system interpretation.  Chart reviewed inclusive of vital signs, progress notes, laboratory results, and diagnostic images.   I spoke to patients RN, Cres[place]da this morning. She shares that Burrel has been doing well from her night report. She expresses that she has no significant concerns this morning.   Akbar is doing well and tolerant of dialysis at this time. Not in distress. Plan for tx this morning.   Plan to continue present measures and discharge home once OP HD clinic is identified.   Questions and concerns addressed/Palliative Support Provided.   Objective Assessment: Vital Signs Vitals:   05/11/22 2315 05/12/22 0408  BP: (!) 128/54 124/68  Pulse: 72 75  Resp:  20  Temp: 98.4 F (36.9 C) 98.2  F (36.8 C)  SpO2: 94% 95%    Intake/Output Summary (Last 24 hours) at 05/12/2022 0814 Last data filed at 05/11/2022 1800 Gross per 24 hour  Intake --  Output 300 ml  Net -300 ml   Last Weight  Most recent update: 05/10/2022  7:21 PM    Weight  75.5 kg (166 lb 7.2 oz)            Gen:  Middle aged [place] M in NAD HEENT: moist mucous membranes CV: Irregular rate and rhythm  PULM: On RA, breathing is even and nonlabored ABD: soft/nontender EXT: No edema Neuro: Alert and oriented x3  SUMMARY OF RECOMMENDATIONS   DNAR/DNI   Continue with dialysis at this time, patient is aware he can stop it at anytime if it no longer suits him   MSW is identifying HD chair location and time   Plan for continued support   Code Status/Advance Care Planning: DNAR/DNI   Symptom Management:  Generalized Weakness: - PT/OT - Daily mobility   Itching: - Atarax TID ATC - Benadryl 12.'5mg'$  IV Q8H PRN   Adult FTT: - Dietician counseling - Offer more palatable food choices - Add low dose mirtazapine    Billing based on MDM: Moderate  ______________________________________________________________________________________ [place] Team Team Cell Phone: [phone] Please utilize secure chat with additional questions, if there is no response within 30 minutes please call the above phone number  Palliative Medicine Team providers are available by phone from 7am to 7pm daily and can be reached through the team cell phone.  Should this patient  require assistance outside of these hours, please call the patient's attending physician.

## 2022-05-12 NOTE — Progress Notes (Signed)
Occupational Therapy Treatment Patient Details Name: Cody Webster MRN: [id number] DOB: 05-15-1966 Today's Date: 05/12/2022   History of present illness Pt is a 56 y.o. male admitted 05/04/22 with chest pain. 12/21 pt developed dysarthria, L facial droop. 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 Pt having iHD trial while admitted. Cardiac cath deferred to outpatient. S/p RIJ tunneled HD CVC placement 12/26. PMH includes ESRD, HTN, GERD, CHF, latent TB, chronic back pain, tobacco use.   OT comments  Pt eager to go home and complaining of being cold. Donned tshirt, jeans and shoes with set up. Pt educated in possibility of weakness on HD days and to alter activity to accommodate. Pt verbalized understanding.    Recommendations for follow up therapy are one component of a multi-disciplinary discharge planning process, led by the attending physician.  Recommendations may be updated based on patient status, additional functional criteria and insurance authorization.    Follow Up Recommendations  No OT follow up     Assistance Recommended at Discharge Set up Supervision/Assistance  Patient can return home with the following  A little help with walking and/or transfers;A little help with bathing/dressing/bathroom;Assistance with cooking/housework;Direct supervision/assist for financial management;Assist for transportation;Direct supervision/assist for medications management;Help with stairs or ramp for entrance   Equipment Recommendations  None recommended by OT    Recommendations for Other Services      Precautions / Restrictions Precautions Precautions: Fall;Other (comment) Precaution Comments: h/o R eye blindness Restrictions Weight Bearing Restrictions: No       Mobility Bed Mobility                    Transfers Overall transfer level: Independent Equipment used: None                     Balance Overall balance assessment:  Needs assistance   Sitting balance-Leahy Scale: Good       Standing balance-Leahy Scale: Fair                             ADL either performed or assessed with clinical judgement   ADL Overall ADL's : Needs assistance/impaired Eating/Feeding: Independent;Sitting               Upper Body Dressing : Set up;Sitting   Lower Body Dressing: Sit to/from stand;Set up                 General ADL Comments: pt seated EOB eating lunch c/o being cold, pt to discharge this pm, assisted to don t-shirt, jeans and shoes    Extremity/Trunk Assessment              Vision       Perception     Praxis      Cognition Arousal/Alertness: Awake/alert Behavior During Therapy: Flat affect Overall Cognitive Status: Impaired/Different from baseline Area of Impairment: Problem solving                             Problem Solving: Decreased initiation, Requires verbal cues          Exercises      Shoulder Instructions       General Comments      Pertinent Vitals/ Pain       Pain Assessment Pain Assessment: No/denies pain  Home Living  Prior Functioning/Environment              Frequency  Min 2X/week        Progress Toward Goals  OT Goals(current goals can now be found in the care plan section)  Progress towards OT goals: Progressing toward goals  Acute Rehab OT Goals OT Goal Formulation: With patient Time For Goal Achievement: 05/23/22 Potential to Achieve Goals: Good  Plan Discharge plan remains appropriate    Co-evaluation                 AM-PAC OT "6 Clicks" Daily Activity     Outcome Measure   Help from another person eating meals?: None Help from another person taking care of personal grooming?: A Little Help from another person toileting, which includes using toliet, bedpan, or urinal?: A Little Help from another person bathing (including washing,  rinsing, drying)?: A Little Help from another person to put on and taking off regular upper body clothing?: None Help from another person to put on and taking off regular lower body clothing?: A Little 6 Click Score: 20    End of Session    OT Visit Diagnosis: Unsteadiness on feet (R26.81);Muscle weakness (generalized) (M62.81);Other symptoms and signs involving cognitive function   Activity Tolerance Patient tolerated treatment well   Patient Left in bed;with call bell/phone within reach   Nurse Communication Other (comment) (aware pt is dressed and eager to go home)        Time: 1324-4010 OT Time Calculation (min): 24 min  Charges: OT General Charges $OT Visit: 1 Visit OT Treatments $Self Care/Home Management : 23-37 mins  Cleta Alberts, OTR/L Acute Rehabilitation Services Office: [phone]   [name] 05/12/2022, 2:31 PM

## 2022-05-13 DIAGNOSIS — N186 End stage renal disease: Secondary | ICD-10-CM | POA: Diagnosis not present

## 2022-05-13 DIAGNOSIS — D509 Iron deficiency anemia, unspecified: Secondary | ICD-10-CM | POA: Diagnosis not present

## 2022-05-13 DIAGNOSIS — Z992 Dependence on renal dialysis: Secondary | ICD-10-CM | POA: Diagnosis not present

## 2022-05-14 ENCOUNTER — Telehealth: Payer: Self-pay | Admitting: Physician Assistant

## 2022-05-14 NOTE — Telephone Encounter (Signed)
Transition of care contact from inpatient facility  Date of Discharge: 05/12/22 Date of Contact: 05/14/22 Method of contact: Phone  Attempted to contact patient to discuss transition of care from inpatient admission. Sounded like patient answered the phone twice but did not answer, may be tech issue. Appears patient did attend outpatient HD yesterday. Will follow up at his HD unit next week.  Anice Paganini, PA-C 05/14/2022, 11:16 AM  [facility] Pager: [phone]

## 2022-05-15 DIAGNOSIS — N186 End stage renal disease: Secondary | ICD-10-CM | POA: Diagnosis not present

## 2022-05-15 DIAGNOSIS — Z992 Dependence on renal dialysis: Secondary | ICD-10-CM | POA: Diagnosis not present

## 2022-05-15 DIAGNOSIS — D509 Iron deficiency anemia, unspecified: Secondary | ICD-10-CM | POA: Diagnosis not present

## 2022-05-17 ENCOUNTER — Encounter (HOSPITAL_COMMUNITY): Payer: Self-pay

## 2022-05-17 ENCOUNTER — Telehealth: Payer: Self-pay

## 2022-05-17 DIAGNOSIS — N186 End stage renal disease: Secondary | ICD-10-CM | POA: Diagnosis not present

## 2022-05-17 DIAGNOSIS — D509 Iron deficiency anemia, unspecified: Secondary | ICD-10-CM | POA: Diagnosis not present

## 2022-05-17 DIAGNOSIS — Z992 Dependence on renal dialysis: Secondary | ICD-10-CM | POA: Diagnosis not present

## 2022-05-17 NOTE — Telephone Encounter (Signed)
Contacted patient to schedule for a right brachiocephalic fistula in 3-4 weeks per Dr. Scot Dock. Patient stated he was on his way to dialysis and requested a callback on tomorrow.

## 2022-05-18 NOTE — Telephone Encounter (Signed)
Attempted to reach patient, no answer. Left message for patient to return call.

## 2022-05-19 ENCOUNTER — Other Ambulatory Visit: Payer: Self-pay

## 2022-05-19 DIAGNOSIS — Z992 Dependence on renal dialysis: Secondary | ICD-10-CM | POA: Diagnosis not present

## 2022-05-19 DIAGNOSIS — D509 Iron deficiency anemia, unspecified: Secondary | ICD-10-CM | POA: Diagnosis not present

## 2022-05-19 DIAGNOSIS — N186 End stage renal disease: Secondary | ICD-10-CM | POA: Diagnosis not present

## 2022-05-19 NOTE — Telephone Encounter (Signed)
Spoke with patient/niece Cody Webster. Patient scheduled for surgery on 1/15/204. Instructions provided- she verbalized understanding.

## 2022-05-20 DIAGNOSIS — Z992 Dependence on renal dialysis: Secondary | ICD-10-CM | POA: Diagnosis not present

## 2022-05-20 DIAGNOSIS — D509 Iron deficiency anemia, unspecified: Secondary | ICD-10-CM | POA: Diagnosis not present

## 2022-05-20 DIAGNOSIS — N186 End stage renal disease: Secondary | ICD-10-CM | POA: Diagnosis not present

## 2022-05-23 DIAGNOSIS — Z992 Dependence on renal dialysis: Secondary | ICD-10-CM | POA: Diagnosis not present

## 2022-05-23 DIAGNOSIS — N186 End stage renal disease: Secondary | ICD-10-CM | POA: Diagnosis not present

## 2022-05-23 DIAGNOSIS — D509 Iron deficiency anemia, unspecified: Secondary | ICD-10-CM | POA: Diagnosis not present

## 2022-05-27 ENCOUNTER — Encounter: Payer: Self-pay | Admitting: Obstetrics and Gynecology

## 2022-05-27 ENCOUNTER — Encounter (HOSPITAL_COMMUNITY): Payer: Self-pay | Admitting: Vascular Surgery

## 2022-05-27 ENCOUNTER — Other Ambulatory Visit: Payer: Medicaid Other | Admitting: Obstetrics and Gynecology

## 2022-05-27 ENCOUNTER — Other Ambulatory Visit: Payer: Self-pay

## 2022-05-27 DIAGNOSIS — Z992 Dependence on renal dialysis: Secondary | ICD-10-CM | POA: Diagnosis not present

## 2022-05-27 DIAGNOSIS — N186 End stage renal disease: Secondary | ICD-10-CM | POA: Diagnosis not present

## 2022-05-27 NOTE — Progress Notes (Signed)
Case: [id number] Date/Time: 05/30/22 0917   Procedure: RIGHT BRACHIOCEPHALIC ARTERIOVENOUS (AV) FISTULA CREATION (Left)   Anesthesia type: Choice   Pre-op diagnosis: ESRD   Location: [facility] OR ROOM 12 / [place] [facility]   Surgeons: Angelia Mould, MD       DISCUSSION: Patient is a 57 year old male scheduled for the above procedure. He was admitted to [facility] 05/04/22-05/12/22. Initially presented with chest pain but also worsening renal disease. Cardiology and nephrology consulted. He initially refused hemodialysis, but ultimately agreed and had Orosi placed 05/10/22. Troponins elevated but flat. 05/05/22 echo showed LVEF 45-50%, anterolateral and inferior hypokinesis, grade 2 DD, RVSP 51.2 mmHg, mild-moderate MR. Initially cardiac cath or CCTA was discussed but on evening of 05/05/22 he developed dysarthria and facial droop. Code stroke called and MRI showed small acute infarcts in the right posterior pons and subcortical left cerebral hemisphere. His chest pain improved with initiation of HD, so given acute CVA plans for further ischemic evaluation was deferred and will reassess at out-patient follow-up. He was placed on ASA and Plavix, it seems both for CVA and CAD but duration of DAPT seems to be deferred to cardiology.    He is scheduled for hospital cardiology follow-up on 06/08/22 with Fabian Sharp, [place] and neurology follow-up with Debbora Presto, NP on 06/09/22.   He does have HD access with [facility]. He had recent CVA < 30 days ago and has not seen cardiology or neurology since hospital discharge to discuss during of antiplatelet therapy and/or if plans for additional testing. Also he reportedly did not like how he was feeling on several of his medications, so is taking only ASA and Coreg. Would recommend postponing elective surgery until after he has had these follow-up visits and preoperative input recommendations provided. Also discussed with anesthesiologist [place], Belenda Cruise, DO. Will notify [facility] staff.      IMAGES: MRI Brain 05/06/22: IMPRESSION: 1. Small acute infarcts in the right posterior pons and subcortical left cerebral hemisphere. 2. Background of severe chronic small vessel ischemia.   EKG: 05/07/22: Normal sinus rhythm Moderate voltage criteria for LVH, may be normal variant ( Sokolow-Lyon , Cornell product ) Borderline ECG When compared with ECG of 04-May-2022 12:51, No significant change was found Confirmed by Oswaldo Milian [phone]) on 05/07/2022 8:28:03 PM   CV: Echo 05/05/22: IMPRESSIONS   1. Left ventricular ejection fraction, by estimation, is 45 to 50%. The  left ventricle has mildly decreased function. The left ventricle  demonstrates regional wall motion abnormalities with basal to mid  anterolateral and basal to mid inferior  hypokinesis. There is mild concentric left ventricular hypertrophy. Left  ventricular diastolic parameters are consistent with Grade II diastolic  dysfunction (pseudonormalization).   2. Right ventricular systolic function is normal. The right ventricular  size is mildly enlarged. There is moderately elevated pulmonary artery  systolic pressure. The estimated right ventricular systolic pressure is  74.0 mmHg.   3. Left atrial size was severely dilated.   4. Right atrial size was mildly dilated.   5. The mitral valve is degenerative. Mild to moderate mitral valve  regurgitation. No evidence of mitral stenosis. Moderate mitral annular  calcification.   6. The aortic valve is tricuspid. There is mild calcification of the  aortic valve. Aortic valve regurgitation is not visualized. No aortic  stenosis is present.   7. The inferior vena cava is dilated in size with <50% respiratory  variability, suggesting right atrial pressure of 15 mmHg.    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 ([place]) 2015   EF now improved back to normal mildly reduced   Peripheral neuropathy    Pneumonia    PTSD (post-traumatic stress disorder)    Seizures ([place])    27 years ago   Stroke [facility]) 09/2021   Tuberculosis     Past Surgical History:  Procedure Laterality Date   AV FISTULA PLACEMENT Left 11/08/2016   Procedure: ARTERIOVENOUS (AV) FISTULA CREATION;  Surgeon: Angelia Mould, MD;  Location: [place];  Service: Vascular;  Laterality: Left;   AV FISTULA PLACEMENT Left 03/23/2020   Procedure: LEFT BASILIC VEIN FISTULA CREATION;  Surgeon: Angelia Mould, MD;  Location: [place];  Service: Vascular;  Laterality: Left;   IR FLUORO GUIDE CV LINE RIGHT  05/10/2022   IR US GUIDE VASC ACCESS RIGHT  05/10/2022   NM MYOVIEW LTD  07/2017   "Moderate sized moderate severity" (on cardiology was very small size, small intensity) partially reversible inferoapical//inferoseptal defect.  (Read as intermediate-high risk) -> over read by cardiology as [place]  09/2019   [facility] Small sized, mild severity reversible defect involving apical lateral & mid anterolateral wall thought to be artifacts - but CRO mild ischemia.  EF 55%. => Read as LOW RISK:   right foot surgery     TOE AMPUTATION     TRANSTHORACIC ECHOCARDIOGRAM  07/2017   EF 60%.,  Moderate LVH.  GR 1 DD.  No R WMA.  Mild RV and moderate RA dilation.   TRANSTHORACIC ECHOCARDIOGRAM  10/03/2019   [facility]: EF 40%, mild MR, mild aortic calcification    MEDICATIONS: No current facility-administered medications for this encounter.    acetaminophen (TYLENOL) 500 MG tablet   aspirin EC 81 MG tablet   atorvastatin (LIPITOR) 40 MG tablet   carvedilol (COREG) 25 MG tablet   cetirizine (ZYRTEC) 10 MG tablet    clopidogrel (PLAVIX) 75 MG tablet   fluticasone (FLONASE) 50 MCG/ACT nasal spray   gabapentin (NEURONTIN) 300 MG capsule   guaiFENesin (MUCINEX) 600 MG 12 hr tablet   hydrALAZINE (APRESOLINE) 50 MG tablet   isoniazid (NYDRAZID) 300 MG tablet   isosorbide mononitrate (IMDUR) 60 MG 24 hr tablet   mirtazapine (REMERON) 7.5 MG tablet   nitroGLYCERIN (NITROSTAT) 0.4 MG SL tablet   oxyCODONE-acetaminophen (PERCOCET) 10-325 MG tablet   pyridOXINE (VITAMIN B6) 50 MG tablet   traZODone (DESYREL) 100 MG tablet    Myra Gianotti, PA-C Surgical Short Stay/Anesthesiology [facility] Phone [phone] [facility] Phone [phone] 05/27/2022 11:48 AM

## 2022-05-27 NOTE — Patient Instructions (Signed)
Visit Information  Cody Webster / DPR was given information about Medicaid Managed Care team care coordination services as a part of their Healthy [facility] Medicaid benefit. Cody Webster/ DPR  verbally consented to engagement with the [facility] Managed Care team.   If you are experiencing a medical emergency, please call 911 or report to your local emergency department or urgent care.   If you have a non-emergency medical problem during routine business hours, please contact your provider's office and ask to speak with a nurse.   For questions related to your Healthy [facility] health plan, please call: [phone] or visit the homepage here: [email or web address]  If you would like to schedule transportation through your Healthy [facility] plan, please call the following number at least 2 days in advance of your appointment: [phone]  For information about your ride after you set it up, call Ride Assist at [phone]. Use this number to activate a Will Call pickup, or if your transportation is late for a scheduled pickup. Use this number, too, if you need to make a change or cancel a previously scheduled reservation.  If you need transportation services right away, call [phone]. The after-hours call center is staffed 24 hours to handle ride assistance and urgent reservation requests (including discharges) 365 days a year. Urgent trips include sick visits, hospital discharge requests and life-sustaining treatment.  Call the [place] at [phone], at any time, 24 hours a day, 7 days a week. If you are in danger or need immediate medical attention call 911.  If you would like help to quit smoking, call 1-800-QUIT-NOW 4077283713) OR Espaol: 1-855-Djelo-Ya (0-109-323-5573) o para ms informacin haga clic aqu or Text READY to 200-400 to register via text  Cody Webster / DPR - following are the goals we discussed in your  visit today:   Goals Addressed    Timeframe:  Long-Range Goal Priority:  High Start Date:     05/27/22                        Expected End Date:                       Follow Up Date 06/24/22    - practice safe sex - schedule appointment for flu shot - schedule appointment for vaccines needed due to my age or health - schedule recommended health tests - schedule and keep appointment for annual check-up    Why is this important?   Screening tests can find diseases early when they are easier to treat.  Your doctor or nurse will talk with you about which tests are important for you.  Getting shots for common diseases like the flu and shingles will help prevent them.    Patient / DPR verbalizes understanding of instructions and care plan provided today and agrees to view in [place]. Active MyChart status and patient / DPR understanding of how to access instructions and care plan via MyChart confirmed with patient/DPR    The Managed Medicaid care management team will reach out to the patient / DPR gain over the next 30 business  days.  The  [name] has been provided with contact information for the Managed Medicaid care management team and has been advised to call with any health related questions or concerns.   Cody Raider RN, BSN [place]  [employer] Curator - Managed Medicaid High Risk [phone]  Following is a copy of your plan of care:  Care Plan : [place] of Care  Updates made by Gayla Medicus, RN since 05/27/2022 12:00 AM     Problem: Health Promotion or Disease Self-Management (General Plan of Care)      Long-Range Goal: Chronic Disease Management and Care Coordination Needs   Start Date: 05/27/2022  Expected End Date: 08/26/2022  Priority: High  Note:   Current Barriers:  Knowledge Deficits related to plan of care for management of CHF, HTN, HLD, DMII, CKD Stage 5, GERD, Tobacco Use, and anemia,  h/o CVA, latent TB, chronic pain, OSA, DDD, neuropathy, vision loss right eye  Care Coordination needs related to eye provider resources Chronic Disease Management support and education needs related to CHF, HTN, HLD, DMII, CKD Stage 5, GERD, Tobacco Use, and anemia, h/o CVA, latent TB, chronic pain, OSA, DDD, neuropathy, vision loss right eye  05/27/12:  Patient only takes his ASA and Coreg as he believes other medications are making him sick.  Has upcoming appts with CARDS, NEURO and PCP.  Fistula surgery cancelled as patient has not has follow up appts with providers since recent hospitalization.  Hospitalized 12/20-12/28 for chest pain.  RNCM Clinical Goal(s):  Patient will verbalize understanding of plan for management of CHF, HTN, HLD, DMII, CKD Stage 5, GERD, Tobacco Use, and anemia, h/o CVA, latent TB, chronic pain, OSA, DDD, neuropathy, vision loss right eye  as evidenced by patient report and taking all medications and attending appointments verbalize basic understanding of  CHF, HTN, HLD, DMII, CKD Stage 5, GERD, Tobacco Use, and anemia, h/o CVA, latent TB, chronic pain, OSA, DDD, neuropathy, vision loss right eye  disease process and self health management plan as evidenced by patient report, taking all medications and attending all appointments. take all medications exactly as prescribed and will call provider for medication related questions as evidenced by patient report demonstrate understanding of rationale for each prescribed medication as evidenced by patient report attend all scheduled medical appointments as evidenced by patient report demonstrate Improved adherence to prescribed treatment plan for CHF, HTN, HLD, DMII, CKD Stage 5, GERD, Tobacco Use, and anemia, h/o CVA, latent TB, chronic pain, OSA, DDD, neuropathy, vision loss right eye as evidenced by patient report, taking all medications, and attending all appointments. continue to work with RN Care Manager to address care  management and care coordination needs related to  CHF, HTN, HLD, DMII, CKD Stage 5, GERD, Tobacco Use, and anemia, h/o CVA, latent TB, chronic pain, OSA, DDD, neuropathy, vision loss right eye as evidenced by adherence to CM Team Scheduled appointments work with Education officer, museum to address  related to the management of eye provider resources related to the management of CHF, HTN, HLD, DMII, CKD Stage 5, GERD, Tobacco Use, and anemia, h/o CVA, latent TB, chronic pain, OSA, DDD, neuropathy, vision loss right eye as evidenced by review of EMR and patient or Education officer, museum report through collaboration with Consulting civil engineer, provider, and care team.   Interventions: Inter-disciplinary care team collaboration (see longitudinal plan of care) Evaluation of current treatment plan related to  self management and patient's adherence to plan as established by provider Provided patient's niece with [facility] transportation information as she requested by sending email to [email or web address] BSW referral for eye provider resources Collaborated with BSW  Heart Failure Interventions:  (Status:  New goal.) Long Term Goal Provided education on low sodium diet Reviewed role of diuretics in prevention  of fluid overload and management of heart failure; Discussed the importance of keeping all appointments with provider Assessed social determinant of health barriers    Chronic Kidney Disease Interventions:  (Status:  New goal.) Long Term Goal Evaluation of current treatment plan related to chronic kidney disease self management and patient's adherence to plan as established by provider      Reviewed prescribed diet Reviewed medications with patient and discussed importance of compliance    Advised patient, providing education and rationale, to monitor blood pressure daily and record, calling PCP for findings outside established parameters    Discussed complications of poorly controlled blood pressure  such as heart disease, stroke, circulatory complications, vision complications, kidney impairment, sexual dysfunction    Reviewed scheduled/upcoming provider appointments including    Advised patient to discuss medications  with provider    Discussed plans with patient for ongoing care management follow up and provided patient with direct contact information for care management team    Assessed social determinant of health barriers    Last practice recorded BP readings:  BP Readings from Last 3 Encounters:  05/12/22 135/68  03/29/22 (!) 165/75  04/30/21 (!) 192/84  Most recent eGFR/CrCl:  Lab Results  Component Value Date   EGFR 6 (L) 04/07/2021    No components found for: "CRCL"  Diabetes Interventions:  (Status:  New goal.) Long Term Goal Assessed patient's understanding of A1c goal: <7% Reviewed medications with patient / DPR and discussed importance of medication adherence Counseled on importance of regular laboratory monitoring as prescribed Discussed plans with patient for ongoing care management follow up and provided patient/DPR  with direct contact information for care management team Reviewed scheduled/upcoming provider appointments  Advised patient/DPR  providing education and rationale, to check cbg as directed  and record, calling provider  for findings outside established parameters Referral made to social work team for assistance with resources Review of patient status, including review of consultants reports, relevant laboratory and other test results, and medications completed Assessed social determinant of health barriers Lab Results  Component Value Date   HGBA1C 5.1 05/06/2022  Hyperlipidemia Interventions:  (Status:  New goal.) Long Term Goal Medication review performed; medication list updated in electronic medical record.  Provider established cholesterol goals reviewed Reviewed importance of limiting foods high in cholesterol Assessed social determinant of  health barriers   Hypertension Interventions:  (Status:  New goal.) Long Term Goal Last practice recorded BP readings:  BP Readings from Last 3 Encounters:  05/12/22 135/68  03/29/22 (!) 165/75  04/30/21 (!) 192/84  Most recent eGFR/CrCl:  Lab Results  Component Value Date   EGFR 6 (L) 04/07/2021    No components found for: "CRCL"  Evaluation of current treatment plan related to hypertension self management and patient's adherence to plan as established by provider Reviewed medications with patient / DPR and discussed importance of compliance Discussed plans with patient/ DPR  for ongoing care management follow up and provided patient with direct contact information for care management team Advised patient/ DPR , providing education and rationale, to monitor blood pressure daily and record, calling PCP for findings outside established parameters Reviewed scheduled/upcoming provider appointments  Advised patient to discuss medications  with provider Assessed social determinant of health barriers  Pain Interventions:  (Status:  New goal.) Long Term Goal Pain assessment performed Medications reviewed Reviewed provider established plan for pain management Discussed importance of adherence to all scheduled medical appointments Counseled on the importance of reporting any/all new or changed pain  symptoms or management strategies to pain management provider Advised patient / DPR o report to care team affect of pain on daily activities Reviewed with patient / DPR prescribed pharmacological and nonpharmacological pain relief strategies Advised patient to discuss medications with provider Assessed social determinant of health barriers  Smoking Cessation Interventions:  (Status:  New goal.) Long Term Goal Reviewed smoking history:  currently smoking 1/2-1 ppd Patient used patch while in hospital-to discuss with PCP at next appt 06/02/22  Evaluation of current treatment plan  reviewed Advised patient / DPR to discuss smoking cessation options with provider Reviewed scheduled/upcoming provider appointments  Provided contact information for [place] Quit Line (1-800-QUIT-NOW) Discussed plans with patient/ DPR  for ongoing care management follow up and provided patient with direct contact information for care management team Advised patient to discuss medications  with provider Assessed social determinant of health barriers  SDOH Barriers (Status:  New goal.) Long Term Goal Patient interviewed and SDOH assessment performed        SDOH Interventions    Flowsheet Row Patient Outreach Telephone from 05/27/2022 in [place]  SDOH Interventions   Alcohol Usage Interventions Intervention Not Indicated (Score <7)  Stress Interventions Intervention Not Indicated     Patient interviewed and appropriate assessments performed Discussed plans with patient for ongoing care management follow up and provided patient with direct contact information for care management team Collaborated with BSW re: eye provider resources  Patient Goals/Self-Care Activities: Take all medications as prescribed Attend all scheduled provider appointments Call pharmacy for medication refills 3-7 days in advance of running out of medications Perform all self care activities independently  Perform IADL's (shopping, preparing meals, housekeeping, managing finances) independently Call provider office for new concerns or questions  Work with the social worker to address care coordination needs and will continue to work with the clinical team to address health care and disease management related needs  Follow Up Plan:  The patient / DPR has been provided with contact information for the care management team and has been advised to call with any health related questions or concerns.  The care management team will reach out to the patient again over the next 30 business  days.

## 2022-05-27 NOTE — Progress Notes (Signed)
Cody Webster denies chest pain or shortness of breath. Patient denies having any s/s of Covid in his household, also denies any known exposure to Covid.   Cody Webster PCP is Juluis Mire, NP, Cardiologist is Dr. Ellyn Hack, neurologist is Dr. Erlinda Hong.  Cody Webster was admitted 12/20/`23, he was diagnosed with a lacunar stroke. Plavix was added along with ASA that he was on and added to his other medications.  Patient has latent TB, he is not taking medications. At this time Cody Webster is only taking ASA and Coreg. Cody Webster said "nobody listens, I've told everyone that some of his medications make me sick, I am only taking  the 2 medications. Cody Webster was discharged on 05/12/22, he was instructed to follow up with PCP in 1-2 weeks and cardiologist in 3-4 weeks.  Patient has not followed up with cardiologist, I do not know if he saw PCP yet.  Cody Webster had a history of type II diabetes, last A1C was 5.1, patient said he no longer has diabetes and he does not check CBG.  I asked anesthesia PA-C to review.

## 2022-05-27 NOTE — Patient Outreach (Signed)
Medicaid Managed Care   Nurse Care Manager Note  05/27/2022 Name:  Cody Webster MRN:  [id number] DOB:  Oct 09, 1965  Cody Webster is an 57 y.o. year old male who is a primary patient of Kerin Perna, NP.  [facility] Managed Care Coordination team was consulted for assistance with:    Chronic healthcare management needs, anemia, h/o CVA, latent TB, GERD, chronic pain, tobacco use, HTN, CHF, DM, HLD, DDD, neuropathy, CKD  Mr. Lipkin / DPR was given information about Medicaid Managed Care Coordination team services today. [place] Power of Attorney (POA) and Environmental consultant (DPR) agreed to services and verbal consent obtained.  Engaged with patient/ DPR by telephone for initial visit in response to provider referral for case management and/or care coordination services.   Assessments/Interventions:  Review of past medical history, allergies, medications, health status, including review of consultants reports, laboratory and other test data, was performed as part of comprehensive evaluation and provision of chronic care management services.  SDOH (Social Determinants of Health) assessments and interventions performed: SDOH Interventions    Flowsheet Row Patient Outreach Telephone from 05/27/2022 in [place]  SDOH Interventions   Alcohol Usage Interventions Intervention Not Indicated (Score <7)  Stress Interventions Intervention Not Indicated     Care Plan  Allergies  Allergen Reactions   Morphine And Related Shortness Of Breath and Other (See Comments)    Hallucination  Tolerates Norco/Vicodin   Medications Reviewed Today     Reviewed by Gayla Medicus, RN (Registered Nurse) on 05/27/22 at 1338  Med List Status: <None>   Medication Order Taking? Sig Documenting Provider Last Dose Status Informant  acetaminophen (TYLENOL) 500 MG tablet [id number] No Take 1,000 mg by mouth every 6 (six) hours as needed (for pain/headaches.).   Patient not taking: Reported on 05/27/2022   [provider] Not Taking Active Self  aspirin EC 81 MG tablet [id number] Yes Take 81 mg by mouth every evening. [provider] Taking Active Self  atorvastatin (LIPITOR) 40 MG tablet [id number] No Take 1 tablet (40 mg total) by mouth daily.  Patient not taking: Reported on 05/27/2022   Darliss Cheney, MD Not Taking Active   carvedilol (COREG) 25 MG tablet [id number] Yes Take 1 tablet (25 mg total) by mouth 2 (two) times daily with a meal. Darliss Cheney, MD Taking Active   cetirizine (ZYRTEC) 10 MG tablet [id number] No TAKE 1 TABLET (10 MG TOTAL) BY MOUTH DAILY.  Patient not taking: Reported on 05/27/2022   Kerin Perna, NP Not Taking Active Self  clopidogrel (PLAVIX) 75 MG tablet [id number] No Take 1 tablet (75 mg total) by mouth daily.  Patient not taking: Reported on 05/27/2022   Darliss Cheney, MD Not Taking Active   fluticasone [facility]) 50 MCG/ACT nasal spray [id number] No Place 2 sprays into both nostrils daily.  Patient not taking: Reported on 05/27/2022   Kerin Perna, NP Not Taking Active Self  gabapentin (NEURONTIN) 300 MG capsule [id number] No Take 1 capsule (300 mg total) by mouth 2 (two) times daily.  Patient not taking: Reported on 05/27/2022   Newt Minion, MD Not Taking Active Self  guaiFENesin (MUCINEX) 600 MG 12 hr tablet [id number] No Take 1 tablet (600 mg total) by mouth 2 (two) times daily as needed for to loosen phlegm.  Patient not taking: Reported on 05/27/2022   Kerin Perna, NP Not Taking Active Self  hydrALAZINE (APRESOLINE) 50 MG tablet [id number] No Take 2 tablets (100  mg total) by mouth 3 (three) times daily.  Patient not taking: Reported on 05/27/2022   Darliss Cheney, MD Not Taking Active   isoniazid (NYDRAZID) 300 MG tablet [id number] No Take 1 tablet (300 mg total) by mouth daily.  Patient not taking: Reported on 05/27/2022   Darliss Cheney, MD Not Taking Active   isosorbide mononitrate  (IMDUR) 60 MG 24 hr tablet [id number] No Take 1 tablet (60 mg total) by mouth daily.  Patient not taking: Reported on 05/27/2022   Darliss Cheney, MD Not Taking Active   mirtazapine (REMERON) 7.5 MG tablet [id number] No Take 1 tablet (7.5 mg total) by mouth at bedtime.  Patient not taking: Reported on 05/27/2022   Darliss Cheney, MD Not Taking Active   nitroGLYCERIN (NITROSTAT) 0.4 MG SL tablet [id number] No Place 1 tablet (0.4 mg total) under the tongue every 5 (five) minutes as needed for chest pain (CP or SOB).  Patient not taking: Reported on 05/27/2022   Florencia Reasons, MD Not Taking Active Self           Med Note Corky Mull   Wed May 04, 2022  3:17 PM) Needs New Rx ( out dated Rx)  oxyCODONE-acetaminophen (PERCOCET) 10-325 MG tablet [id number] No Take 1 tablet by mouth every 6 (six) hours as needed for pain.  Patient not taking: Reported on 05/27/2022   [provider] Not Taking Active Self  pyridOXINE (VITAMIN B6) 50 MG tablet [id number] No Take 1 tablet (50 mg total) by mouth daily.  Patient not taking: Reported on 05/04/2022   Thayer Headings, MD Not Taking Active Self  traZODone (DESYREL) 100 MG tablet [id number] No TAKE 1/2 TO 1 TABLET BY MOUTH AT BEDTIME AS NEEDED FOR SLEEP.  Patient not taking: Reported on 05/27/2022   Kerin Perna, NP Not Taking Active Self           Patient Active Problem List   Diagnosis Date Noted   Elevated troponin 05/09/2022   Unstable angina ([place]) 05/09/2022   Acute ischemic stroke ([place]) 05/06/2022   Acute congestive heart failure ([place]) 05/06/2022   CKD (chronic kidney disease), stage V ([place]) 05/05/2022   Nonspecific chest pain 48/54/6270   Metabolic acidosis 35/00/9381   TB lung, latent 03/29/2022   OSA (obstructive sleep apnea) 12/05/2019   Chronic foot pain, right 04/02/2018   DDD (degenerative disc disease), lumbar 04/02/2018   Abnormal liver enzymes 03/30/2018   Abnormal cardiovascular stress test 02/08/2018   Preoperative  cardiovascular examination 11/04/2016   Malnutrition of moderate degree ([place]) 01/12/2015   DM type 2, uncontrolled, with neuropathy 01/11/2015   Glaucoma    Eye pain    Acute glaucoma of right eye 09/06/2014   Acute renal failure superimposed on chronic kidney disease ([place]) 09/06/2014   Symptomatic anemia 09/06/2014   Tobacco use disorder 09/06/2014   Frequent headaches 07/11/2014   CKD (chronic kidney disease) stage 5, GFR less than 15 ml/min ([place]) 07/11/2014   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 ([place]) 07/11/2014   Ataxia 07/11/2014   CKD (chronic kidney disease) 12/11/2013   Diabetic foot ulcer ([place]) 12/10/2013   Essential hypertension 12/10/2013   Peripheral neuropathy ([place]) 12/10/2013   Pain, dental 10/08/2013   Osteomyelitis of ankle or foot, right, acute ([place]) 08/30/2013   Hyperlipidemia 10/20/2012   Conditions to be addressed/monitored per PCP order:  Chronic healthcare management needs, anemia, h/o CVA, latent TB, GERD, chronic  pain, tobacco use, HTN, CHF, DM, HLD, DDD, neuropathy, CKD  Care Plan : RN Care Manager Plan of Care  Updates made by Gayla Medicus, RN since 05/27/2022 12:00 AM     Problem: Health Promotion or Disease Self-Management (General Plan of Care)      Long-Range Goal: Chronic Disease Management and Care Coordination Needs   Start Date: 05/27/2022  Expected End Date: 08/26/2022  Priority: High  Note:   Current Barriers:  Knowledge Deficits related to plan of care for management of CHF, HTN, HLD, DMII, CKD Stage 5, GERD, Tobacco Use, and anemia, h/o CVA, latent TB, chronic pain, OSA, DDD, neuropathy, vision loss right eye  Care Coordination needs related to eye provider resources Chronic Disease Management support and education needs related to CHF, HTN, HLD, DMII, CKD Stage 5, GERD, Tobacco Use, and anemia, h/o CVA, latent TB, chronic pain, OSA, DDD,  neuropathy, vision loss right eye  05/27/12:  Patient only takes his ASA and Coreg as he believes other medications are making him sick.  Has upcoming appts with CARDS, NEURO and PCP.  Fistula surgery cancelled as patient has not has follow up appts with providers since recent hospitalization.  Hospitalized 12/20-12/28 for chest pain.    RNCM Clinical Goal(s):  Patient will verbalize understanding of plan for management of CHF, HTN, HLD, DMII, CKD Stage 5, GERD, Tobacco Use, and anemia, h/o CVA, latent TB, chronic pain, OSA, DDD, neuropathy, vision loss right eye  as evidenced by patient report and taking all medications and attending appointments verbalize basic understanding of  CHF, HTN, HLD, DMII, CKD Stage 5, GERD, Tobacco Use, and anemia, h/o CVA, latent TB, chronic pain, OSA, DDD, neuropathy, vision loss right eye  disease process and self health management plan as evidenced by patient report, taking all medications and attending all appointments. take all medications exactly as prescribed and will call provider for medication related questions as evidenced by patient report demonstrate understanding of rationale for each prescribed medication as evidenced by patient report attend all scheduled medical appointments as evidenced by patient report demonstrate Improved adherence to prescribed treatment plan for CHF, HTN, HLD, DMII, CKD Stage 5, GERD, Tobacco Use, and anemia, h/o CVA, latent TB, chronic pain, OSA, DDD, neuropathy, vision loss right eye as evidenced by patient report, taking all medications, and attending all appointments. continue to work with RN Care Manager to address care management and care coordination needs related to  CHF, HTN, HLD, DMII, CKD Stage 5, GERD, Tobacco Use, and anemia, h/o CVA, latent TB, chronic pain, OSA, DDD, neuropathy, vision loss right eye as evidenced by adherence to CM Team Scheduled appointments work with Education officer, museum to address  related to the management  of eye provider resources related to the management of CHF, HTN, HLD, DMII, CKD Stage 5, GERD, Tobacco Use, and anemia, h/o CVA, latent TB, chronic pain, OSA, DDD, neuropathy, vision loss right eye as evidenced by review of EMR and patient or Education officer, museum report through collaboration with Consulting civil engineer, provider, and care team.   Interventions: Inter-disciplinary care team collaboration (see longitudinal plan of care) Evaluation of current treatment plan related to  self management and patient's adherence to plan as established by provider Provided patient's niece with [facility] transportation information as she requested by sending email to [email or web address] BSW referral for eye provider resources Collaborated with BSW  Heart Failure Interventions:  (Status:  New goal.) Long Term Goal Provided education on low sodium diet Reviewed role of  diuretics in prevention of fluid overload and management of heart failure; Discussed the importance of keeping all appointments with provider Assessed social determinant of health barriers    Chronic Kidney Disease Interventions:  (Status:  New goal.) Long Term Goal Evaluation of current treatment plan related to chronic kidney disease self management and patient's adherence to plan as established by provider      Reviewed prescribed diet Reviewed medications with patient and discussed importance of compliance    Advised patient, providing education and rationale, to monitor blood pressure daily and record, calling PCP for findings outside established parameters    Discussed complications of poorly controlled blood pressure such as heart disease, stroke, circulatory complications, vision complications, kidney impairment, sexual dysfunction    Reviewed scheduled/upcoming provider appointments including    Advised patient to discuss medications  with provider    Discussed plans with patient for ongoing care management follow up and  provided patient with direct contact information for care management team    Assessed social determinant of health barriers    Last practice recorded BP readings:  BP Readings from Last 3 Encounters:  05/12/22 135/68  03/29/22 (!) 165/75  04/30/21 (!) 192/84  Most recent eGFR/CrCl:  Lab Results  Component Value Date   EGFR 6 (L) 04/07/2021    No components found for: "CRCL"  Diabetes Interventions:  (Status:  New goal.) Long Term Goal Assessed patient's understanding of A1c goal: <7% Reviewed medications with patient / DPR and discussed importance of medication adherence Counseled on importance of regular laboratory monitoring as prescribed Discussed plans with patient for ongoing care management follow up and provided patient/DPR  with direct contact information for care management team Reviewed scheduled/upcoming provider appointments  Advised patient/DPR  providing education and rationale, to check cbg as directed  and record, calling provider  for findings outside established parameters Referral made to social work team for assistance with resources Review of patient status, including review of consultants reports, relevant laboratory and other test results, and medications completed Assessed social determinant of health barriers Lab Results  Component Value Date   HGBA1C 5.1 05/06/2022  Hyperlipidemia Interventions:  (Status:  New goal.) Long Term Goal Medication review performed; medication list updated in electronic medical record.  Provider established cholesterol goals reviewed Reviewed importance of limiting foods high in cholesterol Assessed social determinant of health barriers   Hypertension Interventions:  (Status:  New goal.) Long Term Goal Last practice recorded BP readings:  BP Readings from Last 3 Encounters:  05/12/22 135/68  03/29/22 (!) 165/75  04/30/21 (!) 192/84  Most recent eGFR/CrCl:  Lab Results  Component Value Date   EGFR 6 (L) 04/07/2021    No  components found for: "CRCL"  Evaluation of current treatment plan related to hypertension self management and patient's adherence to plan as established by provider Reviewed medications with patient / DPR and discussed importance of compliance Discussed plans with patient/ DPR  for ongoing care management follow up and provided patient with direct contact information for care management team Advised patient/ DPR , providing education and rationale, to monitor blood pressure daily and record, calling PCP for findings outside established parameters Reviewed scheduled/upcoming provider appointments  Advised patient to discuss medications  with provider Assessed social determinant of health barriers  Pain Interventions:  (Status:  New goal.) Long Term Goal Pain assessment performed Medications reviewed Reviewed provider established plan for pain management Discussed importance of adherence to all scheduled medical appointments Counseled on the importance of reporting any/all new  or changed pain symptoms or management strategies to pain management provider Advised patient / DPR o report to care team affect of pain on daily activities Reviewed with patient / DPR prescribed pharmacological and nonpharmacological pain relief strategies Advised patient to discuss medications with provider Assessed social determinant of health barriers  Smoking Cessation Interventions:  (Status:  New goal.) Long Term Goal Reviewed smoking history:  currently smoking 1/2-1 ppd Patient used patch while in hospital-to discuss with PCP at next appt 06/02/22  Evaluation of current treatment plan reviewed Advised patient / DPR to discuss smoking cessation options with provider Reviewed scheduled/upcoming provider appointments  Provided contact information for [place] Quit Line ([email or web address]) Discussed plans with patient/ DPR  for ongoing care management follow up and provided patient with direct contact information for  care management team Advised patient to discuss medications  with provider Assessed social determinant of health barriers  SDOH Barriers (Status:  New goal.) Long Term Goal Patient interviewed and SDOH assessment performed        SDOH Interventions    Flowsheet Row Patient Outreach Telephone from 05/27/2022 in [place]  SDOH Interventions   Alcohol Usage Interventions Intervention Not Indicated (Score <7)  Stress Interventions Intervention Not Indicated     Patient interviewed and appropriate assessments performed Discussed plans with patient for ongoing care management follow up and provided patient with direct contact information for care management team Collaborated with BSW re: eye provider resources  Patient Goals/Self-Care Activities: Take all medications as prescribed Attend all scheduled provider appointments Call pharmacy for medication refills 3-7 days in advance of running out of medications Perform all self care activities independently  Perform IADL's (shopping, preparing meals, housekeeping, managing finances) independently Call provider office for new concerns or questions  Work with the social worker to address care coordination needs and will continue to work with the clinical team to address health care and disease management related needs  Follow Up Plan:  The patient / DPR has been provided with contact information for the care management team and has been advised to call with any health related questions or concerns.  The care management team will reach out to the patient again over the next 30 business  days.    Long-Range Goal: Establish Plan of Care for Chronic Disease Management Needs   Priority: High  Note:   Timeframe:  Long-Range Goal Priority:  High Start Date:     05/27/22                        Expected End Date:                       Follow Up Date 06/24/22    - practice safe sex - schedule appointment for flu  shot - schedule appointment for vaccines needed due to my age or health - schedule recommended health tests - schedule and keep appointment for annual check-up    Why is this important?   Screening tests can find diseases early when they are easier to treat.  Your doctor or nurse will talk with you about which tests are important for you.  Getting shots for common diseases like the flu and shingles will help prevent them.     Follow Up:  Patient / DPR agrees to Care Plan and Follow-up.  Plan: The Managed Medicaid care management team will reach out to the patient / DPR again over the next 30  business  days. and The  [name] has been provided with contact information for the Managed Medicaid care management team and has been advised to call with any health related questions or concerns.  Date/time of next scheduled RN care management/care coordination outreach:  2/ 9/24 at 1230

## 2022-05-27 NOTE — Telephone Encounter (Signed)
Received a notification from Myra Gianotti, PA-C with [facility] preadmission, recommending to postpone AVF surgery until after patient has been seen by cardiology and neurology with preoperative input from both.   Surgery cancelled. Dr. Scot Dock made aware. Attempted to reach patient to inform of this information. Left message for patient to return call.

## 2022-05-30 ENCOUNTER — Other Ambulatory Visit: Payer: Medicaid Other

## 2022-05-30 ENCOUNTER — Ambulatory Visit (HOSPITAL_COMMUNITY): Admission: RE | Admit: 2022-05-30 | Payer: Medicaid Other | Source: Home / Self Care | Admitting: Vascular Surgery

## 2022-05-30 DIAGNOSIS — Z992 Dependence on renal dialysis: Secondary | ICD-10-CM | POA: Diagnosis not present

## 2022-05-30 DIAGNOSIS — N186 End stage renal disease: Secondary | ICD-10-CM | POA: Diagnosis not present

## 2022-05-30 HISTORY — DX: Respiratory tuberculosis unspecified: A15.9

## 2022-05-30 HISTORY — DX: Cardiac murmur, unspecified: R01.1

## 2022-05-30 SURGERY — ARTERIOVENOUS (AV) FISTULA CREATION
Anesthesia: Choice | Laterality: Left

## 2022-05-30 NOTE — Patient Outreach (Signed)
Medicaid Managed Care Social Work Note  05/30/2022 Name:  Cody Webster MRN:  [id number] DOB:  [birth year]  Cody Webster is an 57 y.o. year old male who is a primary Cody Webster of Kerin Perna, NP.  The [facility] Managed Care Coordination team was consulted for assistance with:  Community Resources   Cody Webster was given information about Medicaid Managed Care Coordination team services today. [place] Legal Guardian agreed to services and verbal consent obtained.  Engaged with Cody Webster  for by telephone forinitial visit in response to referral for case management and/or care coordination services.   Assessments/Interventions:  Review of past medical history, allergies, medications, health status, including review of consultants reports, laboratory and other test data, was performed as part of comprehensive evaluation and provision of chronic care management services.  SDOH: (Social Determinant of Health) assessments and interventions performed: SDOH Interventions    Flowsheet Row Cody Webster Outreach Telephone from 05/27/2022 in [place]  SDOH Interventions   Alcohol Usage Interventions Intervention Not Indicated (Score <7)  Stress Interventions Intervention Not Indicated      BSW completed a telephone outreach with Cody Webster niece. She stated she has had a hard time locating an eye doctor for Cody Webster. BSW will email a list of eye resources to [email or web address]. She also stated Cody Webster is in need of transportation, BSW encouraged her to contact member services to schedule transportation to appointments. BSW will also provide information for services for the blind. No other resources are needed at this time. Advanced Directives Status:  Not addressed in this encounter.  Care Plan                 Allergies  Allergen Reactions   Morphine And Related Shortness Of Breath and Other (See Comments)    Hallucination  Tolerates Norco/Vicodin    Medications  Reviewed Today     Reviewed by Gayla Medicus, RN (Registered Nurse) on 05/27/22 at 1338  Med List Status: <None>   Medication Order Taking? Sig Documenting Provider Last Dose Status Informant  acetaminophen (TYLENOL) 500 MG tablet [id number] No Take 1,000 mg by mouth every 6 (six) hours as needed (for pain/headaches.).  Cody Webster not taking: Reported on 05/27/2022   [provider] Not Taking Active Self  aspirin EC 81 MG tablet [id number] Yes Take 81 mg by mouth every evening. [provider] Taking Active Self  atorvastatin (LIPITOR) 40 MG tablet [id number] No Take 1 tablet (40 mg total) by mouth daily.  Cody Webster not taking: Reported on 05/27/2022   Darliss Cheney, MD Not Taking Active   carvedilol (COREG) 25 MG tablet [id number] Yes Take 1 tablet (25 mg total) by mouth 2 (two) times daily with a meal. Darliss Cheney, MD Taking Active   cetirizine (ZYRTEC) 10 MG tablet [id number] No TAKE 1 TABLET (10 MG TOTAL) BY MOUTH DAILY.  Cody Webster not taking: Reported on 05/27/2022   Kerin Perna, NP Not Taking Active Self  clopidogrel (PLAVIX) 75 MG tablet [id number] No Take 1 tablet (75 mg total) by mouth daily.  Cody Webster not taking: Reported on 05/27/2022   Darliss Cheney, MD Not Taking Active   fluticasone [facility]) 50 MCG/ACT nasal spray [id number] No Place 2 sprays into both nostrils daily.  Cody Webster not taking: Reported on 05/27/2022   Kerin Perna, NP Not Taking Active Self  gabapentin (NEURONTIN) 300 MG capsule [id number] No Take 1 capsule (300 mg total) by mouth 2 (two) times daily.  Cody Webster not taking: Reported on 05/27/2022  Newt Minion, MD Not Taking Active Self  guaiFENesin (MUCINEX) 600 MG 12 hr tablet [id number] No Take 1 tablet (600 mg total) by mouth 2 (two) times daily as needed for to loosen phlegm.  Cody Webster not taking: Reported on 05/27/2022   Kerin Perna, NP Not Taking Active Self  hydrALAZINE (APRESOLINE) 50 MG tablet [id number] No Take 2 tablets (100 mg  total) by mouth 3 (three) times daily.  Cody Webster not taking: Reported on 05/27/2022   Darliss Cheney, MD Not Taking Active   isoniazid (NYDRAZID) 300 MG tablet [id number] No Take 1 tablet (300 mg total) by mouth daily.  Cody Webster not taking: Reported on 05/27/2022   Darliss Cheney, MD Not Taking Active   isosorbide mononitrate (IMDUR) 60 MG 24 hr tablet [id number] No Take 1 tablet (60 mg total) by mouth daily.  Cody Webster not taking: Reported on 05/27/2022   Darliss Cheney, MD Not Taking Active   mirtazapine (REMERON) 7.5 MG tablet [id number] No Take 1 tablet (7.5 mg total) by mouth at bedtime.  Cody Webster not taking: Reported on 05/27/2022   Darliss Cheney, MD Not Taking Active   nitroGLYCERIN (NITROSTAT) 0.4 MG SL tablet [id number] No Place 1 tablet (0.4 mg total) under the tongue every 5 (five) minutes as needed for chest pain (CP or SOB).  Cody Webster not taking: Reported on 05/27/2022   Florencia Reasons, MD Not Taking Active Self           Med Note Corky Mull   Wed May 04, 2022  3:17 PM) Needs New Rx ( out dated Rx)  oxyCODONE-acetaminophen (PERCOCET) 10-325 MG tablet [id number] No Take 1 tablet by mouth every 6 (six) hours as needed for pain.  Cody Webster not taking: Reported on 05/27/2022   [provider] Not Taking Active Self  pyridOXINE (VITAMIN B6) 50 MG tablet [id number] No Take 1 tablet (50 mg total) by mouth daily.  Cody Webster not taking: Reported on 05/04/2022   Thayer Headings, MD Not Taking Active Self  traZODone (DESYREL) 100 MG tablet [id number] No TAKE 1/2 TO 1 TABLET BY MOUTH AT BEDTIME AS NEEDED FOR SLEEP.  Cody Webster not taking: Reported on 05/27/2022   Kerin Perna, NP Not Taking Active Self            Cody Webster Active Problem List   Diagnosis Date Noted   Elevated troponin 05/09/2022   Unstable angina ([place]) 05/09/2022   Acute ischemic stroke ([place]) 05/06/2022   Acute congestive heart failure ([place]) 05/06/2022   CKD (chronic kidney disease), stage V ([place]) 05/05/2022   Nonspecific  chest pain 01/60/1093   Metabolic acidosis 23/55/7322   TB lung, latent 03/29/2022   OSA (obstructive sleep apnea) 12/05/2019   Chronic foot pain, right 04/02/2018   DDD (degenerative disc disease), lumbar 04/02/2018   Abnormal liver enzymes 03/30/2018   Abnormal cardiovascular stress test 02/08/2018   Preoperative cardiovascular examination 11/04/2016   Malnutrition of moderate degree ([place]) 01/12/2015   DM type 2, uncontrolled, with neuropathy 01/11/2015   Glaucoma    Eye pain    Acute glaucoma of right eye 09/06/2014   Acute renal failure superimposed on chronic kidney disease ([place]) 09/06/2014   Symptomatic anemia 09/06/2014   Tobacco use disorder 09/06/2014   Frequent headaches 07/11/2014   CKD (chronic kidney disease) stage 5, GFR less than 15 ml/min ([place]) 07/11/2014   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 ([place]) 07/11/2014   Ataxia 07/11/2014  CKD (chronic kidney disease) 12/11/2013   Diabetic foot ulcer ([place]) 12/10/2013   Essential hypertension 12/10/2013   Peripheral neuropathy ([place]) 12/10/2013   Pain, dental 10/08/2013   Osteomyelitis of ankle or foot, right, acute ([place]) 08/30/2013   Hyperlipidemia 10/20/2012    Conditions to be addressed/monitored per PCP order:   community resources  Care Plan : [place] of Care  Updates made by Ethelda Chick since 05/30/2022 12:00 AM     Problem: Health Promotion or Disease Self-Management (General Plan of Care)      Long-Range Goal: Chronic Disease Management and Care Coordination Needs   Start Date: 05/27/2022  Expected End Date: 08/26/2022  Priority: High  Note:   Current Barriers:  Knowledge Deficits related to plan of care for management of CHF, HTN, HLD, DMII, CKD Stage 5, GERD, Tobacco Use, and anemia, h/o CVA, latent TB, chronic pain, OSA, DDD, neuropathy, vision loss right eye  Care Coordination needs  related to eye provider resources Chronic Disease Management support and education needs related to CHF, HTN, HLD, DMII, CKD Stage 5, GERD, Tobacco Use, and anemia, h/o CVA, latent TB, chronic pain, OSA, DDD, neuropathy, vision loss right eye  05/27/12:  Cody Webster only takes his ASA and Coreg as he believes other medications are making him sick.  Has upcoming appts with CARDS, NEURO and PCP.  Fistula surgery cancelled as Cody Webster has not has follow up appts with providers since recent hospitalization.  Hospitalized 12/20-12/28 for chest pain.  RNCM Clinical Goal(s):  Cody Webster will verbalize understanding of plan for management of CHF, HTN, HLD, DMII, CKD Stage 5, GERD, Tobacco Use, and anemia, h/o CVA, latent TB, chronic pain, OSA, DDD, neuropathy, vision loss right eye  as evidenced by Cody Webster report and taking all medications and attending appointments verbalize basic understanding of  CHF, HTN, HLD, DMII, CKD Stage 5, GERD, Tobacco Use, and anemia, h/o CVA, latent TB, chronic pain, OSA, DDD, neuropathy, vision loss right eye  disease process and self health management plan as evidenced by Cody Webster report, taking all medications and attending all appointments. take all medications exactly as prescribed and will call provider for medication related questions as evidenced by Cody Webster report demonstrate understanding of rationale for each prescribed medication as evidenced by Cody Webster report attend all scheduled medical appointments as evidenced by Cody Webster report demonstrate Improved adherence to prescribed treatment plan for CHF, HTN, HLD, DMII, CKD Stage 5, GERD, Tobacco Use, and anemia, h/o CVA, latent TB, chronic pain, OSA, DDD, neuropathy, vision loss right eye as evidenced by Cody Webster report, taking all medications, and attending all appointments. continue to work with RN Care Manager to address care management and care coordination needs related to  CHF, HTN, HLD, DMII, CKD Stage 5, GERD, Tobacco Use, and  anemia, h/o CVA, latent TB, chronic pain, OSA, DDD, neuropathy, vision loss right eye as evidenced by adherence to CM Team Scheduled appointments work with Education officer, museum to address  related to the management of eye provider resources related to the management of CHF, HTN, HLD, DMII, CKD Stage 5, GERD, Tobacco Use, and anemia, h/o CVA, latent TB, chronic pain, OSA, DDD, neuropathy, vision loss right eye as evidenced by review of EMR and Cody Webster or Education officer, museum report through collaboration with Consulting civil engineer, provider, and care team.   Interventions: Inter-disciplinary care team collaboration (see longitudinal plan of care) Evaluation of current treatment plan related to  self management and Cody Webster's adherence to plan as established by provider Provided Cody Webster's niece with  [facility] transportation information as she requested by sending email to [email or web address] BSW referral for eye provider resources Collaborated with  BSW completed a telephone outreach with Cody Webster niece. She stated she has had a hard time locating an eye doctor for Cody Webster. BSW will email a list of eye resources to [email or web address]. She also stated Cody Webster is in need of transportation, BSW encouraged her to contact member services to schedule transportation to appointments. BSW will also provide information for services for the blind. No other resources are needed at this time.  Heart Failure Interventions:  (Status:  New goal.) Long Term Goal Provided education on low sodium diet Reviewed role of diuretics in prevention of fluid overload and management of heart failure; Discussed the importance of keeping all appointments with provider Assessed social determinant of health barriers    Chronic Kidney Disease Interventions:  (Status:  New goal.) Long Term Goal Evaluation of current treatment plan related to chronic kidney disease self management and Cody Webster's adherence to plan as established by provider       Reviewed prescribed diet Reviewed medications with Cody Webster and discussed importance of compliance    Advised Cody Webster, providing education and rationale, to monitor blood pressure daily and record, calling PCP for findings outside established parameters    Discussed complications of poorly controlled blood pressure such as heart disease, stroke, circulatory complications, vision complications, kidney impairment, sexual dysfunction    Reviewed scheduled/upcoming provider appointments including    Advised Cody Webster to discuss medications  with provider    Discussed plans with Cody Webster for ongoing care management follow up and provided Cody Webster with direct contact information for care management team    Assessed social determinant of health barriers    Last practice recorded BP readings:  BP Readings from Last 3 Encounters:  05/12/22 135/68  03/29/22 (!) 165/75  04/30/21 (!) 192/84  Most recent eGFR/CrCl:  Lab Results  Component Value Date   EGFR 6 (L) 04/07/2021    No components found for: "CRCL"  Diabetes Interventions:  (Status:  New goal.) Long Term Goal Assessed Cody Webster's understanding of A1c goal: <7% Reviewed medications with Cody Webster / DPR and discussed importance of medication adherence Counseled on importance of regular laboratory monitoring as prescribed Discussed plans with Cody Webster for ongoing care management follow up and provided Cody Webster/DPR  with direct contact information for care management team Reviewed scheduled/upcoming provider appointments  Advised Cody Webster/DPR  providing education and rationale, to check cbg as directed  and record, calling provider  for findings outside established parameters Referral made to social work team for assistance with resources Review of Cody Webster status, including review of consultants reports, relevant laboratory and other test results, and medications completed Assessed social determinant of health barriers Lab Results  Component Value  Date   HGBA1C 5.1 05/06/2022  Hyperlipidemia Interventions:  (Status:  New goal.) Long Term Goal Medication review performed; medication list updated in electronic medical record.  Provider established cholesterol goals reviewed Reviewed importance of limiting foods high in cholesterol Assessed social determinant of health barriers   Hypertension Interventions:  (Status:  New goal.) Long Term Goal Last practice recorded BP readings:  BP Readings from Last 3 Encounters:  05/12/22 135/68  03/29/22 (!) 165/75  04/30/21 (!) 192/84  Most recent eGFR/CrCl:  Lab Results  Component Value Date   EGFR 6 (L) 04/07/2021    No components found for: "CRCL"  Evaluation of current treatment plan related to hypertension self management and Cody Webster's adherence to plan as established by provider  Reviewed medications with Cody Webster / DPR and discussed importance of compliance Discussed plans with Cody Webster/ DPR  for ongoing care management follow up and provided Cody Webster with direct contact information for care management team Advised Cody Webster/ DPR , providing education and rationale, to monitor blood pressure daily and record, calling PCP for findings outside established parameters Reviewed scheduled/upcoming provider appointments  Advised Cody Webster to discuss medications  with provider Assessed social determinant of health barriers  Pain Interventions:  (Status:  New goal.) Long Term Goal Pain assessment performed Medications reviewed Reviewed provider established plan for pain management Discussed importance of adherence to all scheduled medical appointments Counseled on the importance of reporting any/all new or changed pain symptoms or management strategies to pain management provider Advised Cody Webster / DPR o report to care team affect of pain on daily activities Reviewed with Cody Webster / DPR prescribed pharmacological and nonpharmacological pain relief strategies Advised Cody Webster to discuss medications  with provider Assessed social determinant of health barriers  Smoking Cessation Interventions:  (Status:  New goal.) Long Term Goal Reviewed smoking history:  currently smoking 1/2-1 ppd Cody Webster used patch while in hospital-to discuss with PCP at next appt 06/02/22  Evaluation of current treatment plan reviewed Advised Cody Webster / DPR to discuss smoking cessation options with provider Reviewed scheduled/upcoming provider appointments  Provided contact information for [place] Quit Line ([email or web address]-[email or web address]) Discussed plans with Cody Webster/ DPR  for ongoing care management follow up and provided Cody Webster with direct contact information for care management team Advised Cody Webster to discuss medications  with provider Assessed social determinant of health barriers  SDOH Barriers (Status:  New goal.) Long Term Goal Cody Webster interviewed and SDOH assessment performed        SDOH Interventions    Flowsheet Row Cody Webster Outreach Telephone from 05/27/2022 in [place]  SDOH Interventions   Alcohol Usage Interventions Intervention Not Indicated (Score <7)  Stress Interventions Intervention Not Indicated     Cody Webster interviewed and appropriate assessments performed Discussed plans with Cody Webster for ongoing care management follow up and provided Cody Webster with direct contact information for care management team Collaborated with BSW re: eye provider resources  Cody Webster Goals/Self-Care Activities: Take all medications as prescribed Attend all scheduled provider appointments Call pharmacy for medication refills 3-7 days in advance of running out of medications Perform all self care activities independently  Perform IADL's (shopping, preparing meals, housekeeping, managing finances) independently Call provider office for new concerns or questions  Work with the social worker to address care coordination needs and will continue to work with the clinical team to address health care and  disease management related needs  Follow Up Plan:  The Cody Webster / DPR has been provided with contact information for the care management team and has been advised to call with any health related questions or concerns.  The care management team will reach out to the Cody Webster again over the next 30 business  days.      Follow up:  Cody Webster agrees to Care Plan and Follow-up.  Plan: The Managed Medicaid care management team will reach out to the Cody Webster again over the next 30 days.  Date/time of next scheduled Social Work care management/care coordination outreach:  06/30/22  Mickel Fuchs, Arita Miss, [place] Medicaid Team  [phone]

## 2022-05-30 NOTE — Patient Instructions (Signed)
Visit Information  Cody Webster was given information about Medicaid Managed Care team care coordination services as a part of their Healthy [facility] Medicaid benefit. Cody Webster verbally consented to engagement with the [facility] Managed Care team.   If you are experiencing a medical emergency, please call 911 or report to your local emergency department or urgent care.   If you have a non-emergency medical problem during routine business hours, please contact your provider's office and ask to speak with a nurse.   For questions related to your Healthy [facility] health plan, please call: [phone] or visit the homepage here: [email or web address]  If you would like to schedule transportation through your Healthy [facility] plan, please call the following number at least 2 days in advance of your appointment: [phone]  For information about your ride after you set it up, call Ride Assist at [phone]. Use this number to activate a Will Call pickup, or if your transportation is late for a scheduled pickup. Use this number, too, if you need to make a change or cancel a previously scheduled reservation.  If you need transportation services right away, call [phone]. The after-hours call center is staffed 24 hours to handle ride assistance and urgent reservation requests (including discharges) 365 days a year. Urgent trips include sick visits, hospital discharge requests and life-sustaining treatment.  Call the [place] at [phone], at any time, 24 hours a day, 7 days a week. If you are in danger or need immediate medical attention call 911.  If you would like help to quit smoking, call 1-800-QUIT-NOW (347) 188-4900) OR Espaol: 1-855-Djelo-Ya (0-973-532-9924) o para ms informacin haga clic aqu or Text READY to 200-400 to register via text  Mr. Fanelli - following are the goals we discussed in your visit today:   Goals  Addressed   None       Social Worker will follow up on 06/30/22.   Cody Webster, BSW, [place]  High Risk Managed Medicaid Team  [phone]   Following is a copy of your plan of care:  Care Plan : [place] of Care  Updates made by Ethelda Chick since 05/30/2022 12:00 AM     Problem: Health Promotion or Disease Self-Management (General Plan of Care)      Long-Range Goal: Chronic Disease Management and Care Coordination Needs   Start Date: 05/27/2022  Expected End Date: 08/26/2022  Priority: High  Note:   Current Barriers:  Knowledge Deficits related to plan of care for management of CHF, HTN, HLD, DMII, CKD Stage 5, GERD, Tobacco Use, and anemia, h/o CVA, latent TB, chronic pain, OSA, DDD, neuropathy, vision loss right eye  Care Coordination needs related to eye provider resources Chronic Disease Management support and education needs related to CHF, HTN, HLD, DMII, CKD Stage 5, GERD, Tobacco Use, and anemia, h/o CVA, latent TB, chronic pain, OSA, DDD, neuropathy, vision loss right eye  05/27/12:  Patient only takes his ASA and Coreg as he believes other medications are making him sick.  Has upcoming appts with CARDS, NEURO and PCP.  Fistula surgery cancelled as patient has not has follow up appts with providers since recent hospitalization.  Hospitalized 12/20-12/28 for chest pain.  RNCM Clinical Goal(s):  Patient will verbalize understanding of plan for management of CHF, HTN, HLD, DMII, CKD Stage 5, GERD, Tobacco Use, and anemia, h/o CVA, latent TB, chronic pain, OSA, DDD, neuropathy, vision loss right eye  as evidenced  by patient report and taking all medications and attending appointments verbalize basic understanding of  CHF, HTN, HLD, DMII, CKD Stage 5, GERD, Tobacco Use, and anemia, h/o CVA, latent TB, chronic pain, OSA, DDD, neuropathy, vision loss right eye  disease process and self health management plan as evidenced by  patient report, taking all medications and attending all appointments. take all medications exactly as prescribed and will call provider for medication related questions as evidenced by patient report demonstrate understanding of rationale for each prescribed medication as evidenced by patient report attend all scheduled medical appointments as evidenced by patient report demonstrate Improved adherence to prescribed treatment plan for CHF, HTN, HLD, DMII, CKD Stage 5, GERD, Tobacco Use, and anemia, h/o CVA, latent TB, chronic pain, OSA, DDD, neuropathy, vision loss right eye as evidenced by patient report, taking all medications, and attending all appointments. continue to work with RN Care Manager to address care management and care coordination needs related to  CHF, HTN, HLD, DMII, CKD Stage 5, GERD, Tobacco Use, and anemia, h/o CVA, latent TB, chronic pain, OSA, DDD, neuropathy, vision loss right eye as evidenced by adherence to CM Team Scheduled appointments work with Education officer, museum to address  related to the management of eye provider resources related to the management of CHF, HTN, HLD, DMII, CKD Stage 5, GERD, Tobacco Use, and anemia, h/o CVA, latent TB, chronic pain, OSA, DDD, neuropathy, vision loss right eye as evidenced by review of EMR and patient or Education officer, museum report through collaboration with Consulting civil engineer, provider, and care team.   Interventions: Inter-disciplinary care team collaboration (see longitudinal plan of care) Evaluation of current treatment plan related to  self management and patient's adherence to plan as established by provider Provided patient's niece with Healthy [facility] transportation information as she requested by sending email to [email or web address] BSW referral for eye provider resources Collaborated with  BSW completed a telephone outreach with patient niece. She stated she has had a hard time locating an eye doctor for patient. BSW will email a  list of eye resources to [email or web address]. She also stated patient is in need of transportation, BSW encouraged her to contact member services to schedule transportation to appointments. BSW will also provide information for services for the blind. No other resources are needed at this time.  Heart Failure Interventions:  (Status:  New goal.) Long Term Goal Provided education on low sodium diet Reviewed role of diuretics in prevention of fluid overload and management of heart failure; Discussed the importance of keeping all appointments with provider Assessed social determinant of health barriers    Chronic Kidney Disease Interventions:  (Status:  New goal.) Long Term Goal Evaluation of current treatment plan related to chronic kidney disease self management and patient's adherence to plan as established by provider      Reviewed prescribed diet Reviewed medications with patient and discussed importance of compliance    Advised patient, providing education and rationale, to monitor blood pressure daily and record, calling PCP for findings outside established parameters    Discussed complications of poorly controlled blood pressure such as heart disease, stroke, circulatory complications, vision complications, kidney impairment, sexual dysfunction    Reviewed scheduled/upcoming provider appointments including    Advised patient to discuss medications  with provider    Discussed plans with patient for ongoing care management follow up and provided patient with direct contact information for care management team    Assessed social determinant of health barriers    Last practice  recorded BP readings:  BP Readings from Last 3 Encounters:  05/12/22 135/68  03/29/22 (!) 165/75  04/30/21 (!) 192/84  Most recent eGFR/CrCl:  Lab Results  Component Value Date   EGFR 6 (L) 04/07/2021    No components found for: "CRCL"  Diabetes Interventions:  (Status:  New goal.) Long Term Goal Assessed  patient's understanding of A1c goal: <7% Reviewed medications with patient / DPR and discussed importance of medication adherence Counseled on importance of regular laboratory monitoring as prescribed Discussed plans with patient for ongoing care management follow up and provided patient/DPR  with direct contact information for care management team Reviewed scheduled/upcoming provider appointments  Advised patient/DPR  providing education and rationale, to check cbg as directed  and record, calling provider  for findings outside established parameters Referral made to social work team for assistance with resources Review of patient status, including review of consultants reports, relevant laboratory and other test results, and medications completed Assessed social determinant of health barriers Lab Results  Component Value Date   HGBA1C 5.1 05/06/2022  Hyperlipidemia Interventions:  (Status:  New goal.) Long Term Goal Medication review performed; medication list updated in electronic medical record.  Provider established cholesterol goals reviewed Reviewed importance of limiting foods high in cholesterol Assessed social determinant of health barriers   Hypertension Interventions:  (Status:  New goal.) Long Term Goal Last practice recorded BP readings:  BP Readings from Last 3 Encounters:  05/12/22 135/68  03/29/22 (!) 165/75  04/30/21 (!) 192/84  Most recent eGFR/CrCl:  Lab Results  Component Value Date   EGFR 6 (L) 04/07/2021    No components found for: "CRCL"  Evaluation of current treatment plan related to hypertension self management and patient's adherence to plan as established by provider Reviewed medications with patient / DPR and discussed importance of compliance Discussed plans with patient/ DPR  for ongoing care management follow up and provided patient with direct contact information for care management team Advised patient/ DPR , providing education and rationale, to  monitor blood pressure daily and record, calling PCP for findings outside established parameters Reviewed scheduled/upcoming provider appointments  Advised patient to discuss medications  with provider Assessed social determinant of health barriers  Pain Interventions:  (Status:  New goal.) Long Term Goal Pain assessment performed Medications reviewed Reviewed provider established plan for pain management Discussed importance of adherence to all scheduled medical appointments Counseled on the importance of reporting any/all new or changed pain symptoms or management strategies to pain management provider Advised patient / DPR o report to care team affect of pain on daily activities Reviewed with patient / DPR prescribed pharmacological and nonpharmacological pain relief strategies Advised patient to discuss medications with provider Assessed social determinant of health barriers  Smoking Cessation Interventions:  (Status:  New goal.) Long Term Goal Reviewed smoking history:  currently smoking 1/2-1 ppd Patient used patch while in hospital-to discuss with PCP at next appt 06/02/22  Evaluation of current treatment plan reviewed Advised patient / DPR to discuss smoking cessation options with provider Reviewed scheduled/upcoming provider appointments  Provided contact information for [place] [facility]uit [facility] (1-800-QUIT-NOW) Discussed plans with patient/ DPR  for ongoing care management follow up and provided patient with direct contact information for care management team Advised patient to discuss medications  with provider Assessed social determinant of health barriers  SDOH Barriers (Status:  New goal.) Long Term Goal Patient interviewed and SDOH assessment performed        SDOH Interventions    Flowsheet Row  Patient Outreach Telephone from 05/27/2022 in [place]  SDOH Interventions   Alcohol Usage Interventions Intervention Not Indicated (Score <7)  Stress  Interventions Intervention Not Indicated     Patient interviewed and appropriate assessments performed Discussed plans with patient for ongoing care management follow up and provided patient with direct contact information for care management team Collaborated with BSW re: eye provider resources  Patient Goals/Self-Care Activities: Take all medications as prescribed Attend all scheduled provider appointments Call pharmacy for medication refills 3-7 days in advance of running out of medications Perform all self care activities independently  Perform IADL's (shopping, preparing meals, housekeeping, managing finances) independently Call provider office for new concerns or questions  Work with the social worker to address care coordination needs and will continue to work with the clinical team to address health care and disease management related needs  Follow Up Plan:  The patient / DPR has been provided with contact information for the care management team and has been advised to call with any health related questions or concerns.  The care management team will reach out to the patient again over the next 30 business  days.

## 2022-05-31 NOTE — Telephone Encounter (Signed)
Noted patient has cardiology appointment on 06/08/22 and neurology appointment on 06/09/22.

## 2022-06-02 ENCOUNTER — Other Ambulatory Visit (INDEPENDENT_AMBULATORY_CARE_PROVIDER_SITE_OTHER): Payer: Self-pay | Admitting: Primary Care

## 2022-06-02 DIAGNOSIS — F5101 Primary insomnia: Secondary | ICD-10-CM

## 2022-06-02 NOTE — Telephone Encounter (Signed)
Requested medications are due for refill today.  yes  Requested medications are on the active medications list.  yes  Last refill. 10/27/2021 #90 1 rf  Future visit scheduled.   no  Notes to clinic.  Pt is more than 3 months overdue for ov. Called pt to make an appt. Pt will call back later.    Requested Prescriptions  Pending Prescriptions Disp Refills   traZODone (DESYREL) 100 MG tablet [Pharmacy Med Name: TRAZODONE HCL 100 MG ORAL TABLET] 90 tablet 1    Sig: TAKE 1/2 TO 1 TABLET BY MOUTH AT BEDTIME AS NEEDED FOR SLEEP.     Psychiatry: Antidepressants - Serotonin Modulator Passed - 06/02/2022 11:28 AM      Passed - Valid encounter within last 6 months    Recent Outpatient Visits           1 year ago Screening for diabetes mellitus   [place] Kerin Perna, NP   1 year ago URI with cough and congestion   [place] [facility] Kerin Perna, NP   1 year ago Chronic sinusitis, unspecified location   [place] Kerin Perna, NP   2 years ago CKD (chronic kidney disease), stage IV ([place])   [place] [facility] Kerin Perna, NP   2 years ago Essential hypertension   [place], [place], NP       Future Appointments             In 6 days [place], Tami Lin, [place]   In 1 week Debbora Presto, NP [facility]   In 1 month Sharol Given Illene Regulus, MD [facility]

## 2022-06-02 NOTE — Telephone Encounter (Signed)
Called pt to make an appt. Pt will call back to make an appt.

## 2022-06-03 DIAGNOSIS — Z992 Dependence on renal dialysis: Secondary | ICD-10-CM | POA: Diagnosis not present

## 2022-06-03 DIAGNOSIS — N186 End stage renal disease: Secondary | ICD-10-CM | POA: Diagnosis not present

## 2022-06-03 NOTE — Telephone Encounter (Signed)
Will forward to provider  

## 2022-06-06 DIAGNOSIS — Z992 Dependence on renal dialysis: Secondary | ICD-10-CM | POA: Diagnosis not present

## 2022-06-06 DIAGNOSIS — N186 End stage renal disease: Secondary | ICD-10-CM | POA: Diagnosis not present

## 2022-06-06 DIAGNOSIS — R7881 Bacteremia: Secondary | ICD-10-CM | POA: Diagnosis not present

## 2022-06-06 DIAGNOSIS — D689 Coagulation defect, unspecified: Secondary | ICD-10-CM | POA: Diagnosis not present

## 2022-06-06 NOTE — Progress Notes (Deleted)
Cardiology Office Note:    Date:  06/07/2022   ID:  Cody Webster, DOB 09-23-1965, MRN [id number]  PCP:  Kerin Perna, NP   [place] Providers Cardiologist:  Glenetta Hew, MD { Click to update primary MD,subspecialty MD or APP then REFRESH:1}    Referring MD: Kerin Perna, NP   No chief complaint on file. ***  History of Present Illness:    Cody Webster is a 57 y.o. male with a hx of ESRD on HD, hypertension, HFpEF, GERD, DM, diabetic retinopathy, and latent TB.  He has previously followed with Dr. Ellyn Hack for an abnormal cardiovascular stress test in 2019 felt low risk when read by cardiology. Echo 2019 with preserved EF and grade 1 DD, moderate LVH.  Given lack of anginal symptoms and CKD 5, medical management was recommended versus cardiac catheterization.  He has followed with: Heart care as well as [facility] cardiology. He was recently treated for latent TB but stopped taking medications because he felt like they were making him sick (recently re-prescribed medication, but refused to take it). Patient has a long standing history of CKD and had previously undergone fistula placement. However, he had been reluctant to agree to dialysis in the past.   He was recently seen by cardiology during a hospitalization in 04/2022 for intermittent chest pain and orthopnea x several months. Presented with hemoglobin of 7 and received 1 unit PRBC. High sensitivity troponins that admission 221>220>190>246.  Patient agreed to HD that admission, but his LUE AFV was nonfunctioning. He underwent temporary right IJ HD catheter placement, and his symptoms improved with dialysis. Echocardiogram 05/05/22 showed EF 45-50% with regional wall motion abnormalities, mild LVH, grade II DD, mild-moderate mitral valve regurgitation. Given reduced EF with WMA, initially planned to complete cardiac catheterization that admission. Unfortunately, patient was found to have lacunar infarcts that  admission, thought to be related to small vessel disease. Patient preferred to recover from dialysis and strokes prior to ischemic evaluation. As his symptoms improved with dialysis, this was thought to be reasonable. Prior to discharge, he had a tunneled catheter placed. He was discharged on aspirin, plavix, carvedilol, imdur, hydralazine, lipitor. Patient was scheduled to have a right brachiocephalic AV fistula placed as an outpatient, but this has been postponed to allow time for cardiology and neurology follow up.   Today ***   Chest pain, concerning for unstable angina CAD with prior evidence of ischemic on stress testing  - Nuclear stress test in 07/2017 showed moderate size region of moderate ischemia in the inferior basilar segment of the inferior wall. At the time, patient had CKD stage V and was unwilling to consider HD, so he was managed medically.  - Patient was recently admitted in 04/2022 for chest pain. hsTn 221>220>190>246. Echo with EF 45-50% with regional wall motion abnormalities. Started HD. Planned to complete cardiac catheterization that admission, but patient had lacunar stroke on 12/21  - Now, patient is just over 30 days post CVA.  - Cardiac Catheterization-- note, currently has a tunneled catheter for HD, plans to have right brachiocephalic AV fistula created, DAPT? How to arrange with HD. *** - Patient has been on ASA 81 mg daily, and he was started on plavix after stroke. Neurology recommended continue DAPT until seen by cardiology as an outpatient, and we would decide on duration. ***  Chronic HFmrEF  - Echo from 04/2022 with EF 45-50% - Continue carvedilol 25 mg BID, imdur 30 mg daiy, hydralazine 100 mg TID  -  Note on ACE/ARB/ARNI or MRA due to ESRD  - Volume managed with HD   HTN   HLD  - Most recent lipid panel from 05/05/22 showed LDL 107, HDL 34, triglycerides 76, total cholesterol 156. Was started on lipitor 40 mg at that time  - Will need lipid panel and LFTs  in 4 weeks (8 weeks after starting lipitor)   Anemia of Chronic Disease  - Check CBC as patient has been on DAPT with ASA, plavix for approx 1 month   Lacunar Stroke - Occurred 05/05/22. Neurology requested 30 day cardiac monitor ***   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 ([place]) 2015   EF now improved back to normal mildly reduced   Peripheral neuropathy    Pneumonia    PTSD (post-traumatic stress disorder)    Seizures ([place])    27 years ago   Stroke ([place]) 09/2021   Tuberculosis     Past Surgical History:  Procedure Laterality Date   AV FISTULA PLACEMENT Left 11/08/2016   Procedure: ARTERIOVENOUS (AV) FISTULA CREATION;  Surgeon: Angelia Mould, MD;  Location: [place];  Service: Vascular;  Laterality: Left;   AV FISTULA PLACEMENT Left 03/23/2020   Procedure: LEFT BASILIC VEIN FISTULA CREATION;  Surgeon: Angelia Mould, MD;  Location: [place];  Service: Vascular;  Laterality: Left;   IR FLUORO GUIDE CV LINE RIGHT  05/10/2022   IR US GUIDE VASC ACCESS RIGHT  05/10/2022   NM MYOVIEW LTD  07/2017   "Moderate sized moderate severity" (on cardiology was very small size, small intensity) partially reversible inferoapical//inferoseptal defect.  (Read as intermediate-high risk) -> over read by cardiology as [place]  09/2019   [facility] Small sized, mild severity reversible defect involving apical lateral & mid anterolateral wall thought to be artifacts - but CRO mild ischemia.  EF 55%. => Read as LOW RISK:   right foot surgery     TOE AMPUTATION     TRANSTHORACIC ECHOCARDIOGRAM  07/2017   EF 60%.,  Moderate LVH.  GR 1  DD.  No R WMA.  Mild RV and moderate RA dilation.   TRANSTHORACIC ECHOCARDIOGRAM  10/03/2019   [facility]: EF 40%, mild MR, mild aortic calcification    Current Medications: No outpatient medications have been marked as taking for the 06/08/22 encounter (Appointment) with Ledora Bottcher, [place].     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   Drug use: Not Currently    Comment: since 2021   Sexual activity: Not Currently  Other Topics Concern   Not on file  Social History Narrative   Not on file   Social Determinants of Health   Financial Resource Strain: Not on file  Food Insecurity: No Food Insecurity (05/05/2022)   Hunger Vital Sign    Worried About Running Out of Food in the Last Year: Never true    Ran Out of Food in the Last Year: Never true  Transportation Needs: No Transportation Needs (05/05/2022)   PRAPARE - Hydrologist (Medical): No    Lack of Transportation (Non-Medical): No  Physical Activity: Not on file  Stress: No Stress Concern Present (05/27/2022)   [place]    Feeling of Stress : Only a little  Social Connections: Not on file     Family History: The patient's family history includes CAD in his father, mother, and sister; Colon cancer in his maternal uncle; Diabetes type II  in his brother and father; Heart attack in his mother; Hypertension in his sister; Stroke in his father. There is no history of Colon polyps, Esophageal cancer, Stomach cancer, or Rectal cancer.  ROS:   Please see the history of present illness.     All other systems reviewed and are negative.  EKGs/Labs/Other Studies Reviewed:    The following studies were reviewed today:  Nuclear Stress Test 07/21/17  IMPRESSION: 1. Moderate size region of moderate ischemia in the inferior basilar segment of the inferior wall 2. Mild LEFT ventricular dilatation 3. Left ventricular ejection fraction 47% 4. Non invasive risk stratification*: Intermediate to high  Echocardiogram 05/05/22 1. Left ventricular ejection fraction, by estimation, is 45 to 50%. The  left ventricle has mildly decreased function. The left ventricle  demonstrates regional wall motion abnormalities with basal to mid  anterolateral and basal to mid inferior  hypokinesis. There is mild concentric left ventricular hypertrophy. Left  ventricular diastolic parameters are consistent with Grade II diastolic  dysfunction (pseudonormalization).   2. Right ventricular systolic function is normal. The right ventricular  size is mildly enlarged. There is moderately elevated pulmonary artery  systolic pressure. The estimated right ventricular systolic pressure is  0000000 mmHg.   3. Left atrial size was severely dilated.   4. Right atrial size was mildly dilated.   5. The mitral valve is degenerative. Mild to moderate mitral valve  regurgitation. No evidence of mitral stenosis. Moderate mitral annular  calcification.   6. The aortic valve is tricuspid. There is mild calcification of the  aortic valve. Aortic valve regurgitation is not visualized. No aortic  stenosis is present.   7. The inferior vena cava is dilated in size with <50% respiratory  variability, suggesting right atrial pressure of 15 mmHg.    EKG:  EKG is *** ordered today.   The ekg ordered today demonstrates ***  Recent Labs: 03/29/2022: ALT 10 05/04/2022: B Natriuretic Peptide 2,226.4 05/12/2022: BUN 33; Creatinine, Ser 6.41; Hemoglobin 8.0; Platelets 184; Potassium 4.3; Sodium 135  Recent Lipid Panel    Component Value Date/Time   CHOL 156 05/05/2022 0025   CHOL 179 04/07/2021 1134   TRIG 76 05/05/2022 0025   HDL 34 (L) 05/05/2022 0025   HDL 48 04/07/2021 1134   CHOLHDL  4.6 05/05/2022 0025   VLDL 15 05/05/2022 0025   LDLCALC 107 (H) 05/05/2022 0025   LDLCALC 117 (H) 04/07/2021 1134     Risk Assessment/Calculations:   {Does this patient have ATRIAL FIBRILLATION?:[phone]}  No BP recorded.  {Refresh Note OR Click here to enter BP  :1}***         Physical Exam:    VS:  There were no vitals taken for this visit.    Wt Readings from Last 3 Encounters:  05/12/22 160 lb 11.5 oz (72.9 kg)  03/29/22 177 lb (80.3 kg)  04/30/21 189 lb 6.4 oz (85.9 kg)     GEN: *** Well nourished, well developed in no acute distress HEENT: Normal NECK: No JVD; No carotid bruits LYMPHATICS: No lymphadenopathy CARDIAC: ***RRR, no murmurs, rubs, gallops RESPIRATORY:  Clear to auscultation without rales, wheezing or rhonchi  ABDOMEN: Soft, non-tender, non-distended MUSCULOSKELETAL:  No edema; No deformity  SKIN: Warm and dry NEUROLOGIC:  Alert and oriented x 3 PSYCHIATRIC:  Normal affect   ASSESSMENT:    No diagnosis found. PLAN:    In order of problems listed above:  ***      {Are you ordering a CV Procedure (e.g. stress test, cath, DCCV, TEE, etc)?   Press F2        :[id number]    Medication Adjustments/Labs and Tests Ordered: Current medicines are reviewed at length with the patient today.  Concerns regarding medicines are outlined above.  No orders of the defined types were placed in this encounter.  No orders of the defined types were placed in this encounter.   There are no Patient Instructions on file for this visit.    Signed, Margie Billet, PA-C  06/07/2022 10:33 AM    [place]

## 2022-06-08 ENCOUNTER — Ambulatory Visit: Payer: Medicaid Other | Admitting: Physician Assistant

## 2022-06-08 DIAGNOSIS — N186 End stage renal disease: Secondary | ICD-10-CM | POA: Diagnosis not present

## 2022-06-08 DIAGNOSIS — D689 Coagulation defect, unspecified: Secondary | ICD-10-CM | POA: Diagnosis not present

## 2022-06-08 DIAGNOSIS — Z992 Dependence on renal dialysis: Secondary | ICD-10-CM | POA: Diagnosis not present

## 2022-06-08 DIAGNOSIS — R7881 Bacteremia: Secondary | ICD-10-CM | POA: Diagnosis not present

## 2022-06-08 NOTE — Patient Instructions (Incomplete)

## 2022-06-08 NOTE — Progress Notes (Deleted)
[facility] [address] [place]. [place] [address] [phone]       HOSPITAL FOLLOW UP NOTE  [name] Date of Birth:  [birth year] Medical Record Number:  [id number]   Reason for Referral:  hospital stroke follow up    SUBJECTIVE:   CHIEF COMPLAINT:  No chief complaint on file.   HPI:   Cody Webster is a 57 y.o. 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  Patient presented to [facility] on 05/04/2022 with complaints of intermittent chest pain. Workup concerning for acute CHF exacerbation and renal failure. He was transferred to [facility] and noted to have dysarthria and facial droop on the evening of 05/05/2022. MRI showed acute infarct right posterior pons and subcortical left cerebral hemisphere. Cardiac cath postponed due to stroke and planned outpatient. He was started on hemodialysis 05/07/2022 and set up for outpatient therapy with [facility] on MWF. He was taking asa. Plavix added. Advised to continue until cardiology eval outpatient. Atorvastatin continued. 30 day CardioNet monitor ordered outpatient. Hydralazine, Imdur and Coreg continued. Latent TB meds refused by patient. Personally reviewed hospitalization pertinent progress notes, lab work and imaging.  Evaluated by Erlinda Hong.     Per Care outreach notes 05/27/2022, patient only taking asa and Coreg as he felt all other medications were making him sick.   Cardiology? Heart monitor?   Dialysis?   OSA?    PERTINENT IMAGING/LABS  CT head No acute abnormality. Small vessel disease.   MRI small acute infarct in right posterior pons and subcortical left hemisphere MRA: Motion degraded exam. Negative intracranial MRA. No large vessel occlusion. No visible       hemodynamically significant or correctable stenosis. Carotid Doppler 40 to 59% stenosis in bilateral ICAs 2D Echo EF 45 to 50%, mild LVH, grade 2 diastolic dysfunction, severely dilated left atrium, no atrial level shunt Will consider 30-day cardiac monitor as outpt given left atrial dilation   A1C Lab Results  Component Value Date   HGBA1C 5.1 05/06/2022    Lipid Panel     Component Value Date/Time   CHOL 156 05/05/2022 0025   CHOL 179 04/07/2021 1134   TRIG 76 05/05/2022 0025   HDL 34 (L) 05/05/2022 0025   HDL 48 04/07/2021 1134   CHOLHDL 4.6 05/05/2022 0025   VLDL 15 05/05/2022 0025   LDLCALC 107 (H) 05/05/2022 0025   LDLCALC 117 (H) 04/07/2021 1134   LABVLDL 14 04/07/2021 1134      ROS:   14 system review of systems performed and negative with exception of those listed in HPI  PMH:  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;  Service: Vascular;  Laterality: Left;   AV FISTULA PLACEMENT Left 03/23/2020   Procedure: LEFT BASILIC VEIN FISTULA  CREATION;  Surgeon: Angelia Mould, MD;  Location: [place];  Service: Vascular;  Laterality: Left;   IR FLUORO GUIDE CV LINE RIGHT  05/10/2022   IR US GUIDE VASC ACCESS RIGHT  05/10/2022   NM MYOVIEW LTD  07/2017   "Moderate sized moderate severity" (on cardiology was very small size, small intensity) partially reversible inferoapical//inferoseptal defect.  (Read as intermediate-high risk) -> over read by cardiology as [place]  09/2019   [facility] Small sized, mild severity reversible defect involving apical lateral & mid anterolateral wall thought to be artifacts - but CRO mild ischemia.  EF 55%. => Read as LOW RISK:   right foot surgery     TOE AMPUTATION     TRANSTHORACIC ECHOCARDIOGRAM  07/2017   EF 60%.,  Moderate LVH.  GR 1 DD.  No R WMA.  Mild RV and moderate RA dilation.   TRANSTHORACIC ECHOCARDIOGRAM  10/03/2019   [facility]: EF 40%, mild MR, mild aortic calcification    Social History:  Social History   Socioeconomic History   Marital status: Single    Spouse name: Not on file   Number of children: 1   Years of education: Not on file   Highest education level: Not on file  Occupational History    Comment: Disabled - for Back pain & foot ulcer  Tobacco Use   Smoking status: Every Day    Packs/day: 0.25    Years: 30.00    Total pack years: 7.50    Types: Cigarettes   Smokeless tobacco: Former    Types: Snuff, Chew    Quit date: 12/11/1983  Vaping Use   Vaping Use: Never used  Substance and Sexual Activity   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   Drug use: Not Currently    Comment: since 2021   Sexual activity: Not Currently  Other Topics Concern   Not on file  Social History Narrative   Not on file   Social Determinants of Health   Financial Resource Strain: Not on file  Food Insecurity: No Food Insecurity (05/05/2022)   Hunger Vital Sign    Worried About Running Out of Food in the Last Year: Never true     Ran Out of Food in the Last Year: Never true  Transportation Needs: No Transportation Needs (05/05/2022)   PRAPARE - Hydrologist (Medical): No    Lack of Transportation (Non-Medical): No  Physical Activity: Not on file  Stress: No Stress Concern Present (05/27/2022)   [place]    Feeling of Stress : Only a little  Social Connections: Not on file  Intimate Partner Violence: Not At Risk (05/05/2022)   Humiliation, Afraid, Rape, and Kick questionnaire    Fear of Current or Ex-Partner: No    Emotionally Abused: No    Physically Abused: No    Sexually Abused: No    Family History:  Family History  Problem Relation Age of Onset   CAD Mother    Heart attack Mother    Diabetes type II Father    Stroke Father    CAD Father    Hypertension Sister    CAD Sister    Diabetes type II Brother    Colon cancer Maternal Uncle        2015   Colon polyps Neg Hx    Esophageal cancer Neg Hx    Stomach cancer Neg Hx    Rectal cancer Neg Hx     Medications:   Current Outpatient Medications on File Prior to Visit  Medication Sig Dispense Refill   acetaminophen (TYLENOL) 500 MG tablet Take 1,000 mg by mouth every 6 (six) hours as needed (for pain/headaches.). (Patient not taking: Reported on 05/27/2022)     aspirin EC 81 MG tablet Take 81 mg by mouth every evening.     atorvastatin (LIPITOR) 40 MG tablet Take 1 tablet (40 mg total) by mouth daily. (Patient not taking: Reported on 05/27/2022) 30 tablet 0   carvedilol (COREG) 25 MG tablet Take 1 tablet (25 mg total) by mouth 2 (two) times daily with a meal. 60 tablet 0   cetirizine (ZYRTEC) 10 MG tablet TAKE 1 TABLET (10 MG TOTAL) BY MOUTH DAILY. (Patient not taking: Reported on 05/27/2022) 90 tablet 6   clopidogrel (PLAVIX) 75 MG tablet Take 1 tablet (75 mg total) by mouth daily. (Patient not taking: Reported on 05/27/2022) 30 tablet 0   fluticasone  (FLONASE) 50 MCG/ACT nasal spray Place 2 sprays into both nostrils daily. (Patient not taking: Reported on 05/27/2022) 16 g 6   gabapentin (NEURONTIN) 300 MG capsule Take 1 capsule (300 mg total) by mouth 2 (two) times daily. (Patient not taking: Reported on 05/27/2022) 180 capsule 1   guaiFENesin (MUCINEX) 600 MG 12 hr tablet Take 1 tablet (600 mg total) by mouth 2 (two) times daily as needed for to loosen phlegm. (Patient not taking: Reported on 05/27/2022) 60 tablet 1   hydrALAZINE (APRESOLINE) 50 MG tablet Take 2 tablets (100 mg total) by mouth 3 (three) times daily. (Patient not taking: Reported on 05/27/2022) 180 tablet 0   isoniazid (NYDRAZID) 300 MG tablet Take 1 tablet (300 mg total) by mouth daily. (Patient not taking: Reported on 05/27/2022) 30 tablet 5   isosorbide mononitrate (IMDUR) 60 MG 24 hr tablet Take 1 tablet (60 mg total) by mouth daily. (Patient not taking: Reported on 05/27/2022) 30 tablet 0   mirtazapine (REMERON) 7.5 MG tablet Take 1 tablet (7.5 mg total) by mouth at bedtime. (Patient not taking: Reported on 05/27/2022) 30 tablet 0   nitroGLYCERIN (NITROSTAT) 0.4 MG SL tablet Place 1 tablet (0.4 mg total) under the tongue every 5 (five) minutes as needed for chest pain (CP or SOB). (Patient not taking: Reported on 05/27/2022) 30 tablet 0   pyridOXINE (VITAMIN B6) 50 MG tablet Take 1 tablet (50 mg total) by mouth daily. (Patient not taking: Reported on 05/04/2022) 30 tablet 5   traZODone (DESYREL) 100 MG tablet TAKE 1/2 TO 1 TABLET BY MOUTH AT BEDTIME AS NEEDED FOR SLEEP. 90 tablet 1   No current facility-administered medications on file prior to visit.    Allergies:   Allergies  Allergen Reactions   Morphine And Related Shortness Of Breath and Other (See Comments)    Hallucination  Tolerates Norco/Vicodin      OBJECTIVE:  Physical Exam  There were no vitals filed for this visit. There is no height  or weight on file to calculate BMI. No results found.     05/27/2020     3:30 PM  Depression screen PHQ 2/9  Decreased Interest 1  PHQ - 2 Score 1  Altered sleeping 3  Tired, decreased energy 0  Change in appetite 0  Feeling bad or failure about yourself  0  Trouble concentrating 0  Moving slowly or fidgety/restless 0  Suicidal thoughts 0  PHQ-9 Score 4     General: well developed, well nourished, seated, in no evident distress Head: head normocephalic and atraumatic.   Neck: supple with no carotid or supraclavicular bruits Cardiovascular: regular rate and rhythm, no murmurs Musculoskeletal: no deformity Skin:  no rash/petichiae Vascular:  Normal pulses all extremities   Neurologic Exam Mental Status: Awake and fully alert.  Fluent speech and language.  Oriented to place and time. Recent and remote memory intact. Attention span, concentration and fund of knowledge appropriate. Mood and affect appropriate.  Cranial Nerves: Fundoscopic exam reveals sharp disc margins. Pupils equal, briskly reactive to light. Extraocular movements full without nystagmus. Visual fields full to confrontation. Hearing intact. Facial sensation intact. Face, tongue, palate moves normally and symmetrically.  Motor: Normal bulk and tone. Normal strength in all tested extremity muscles Sensory.: intact to touch , pinprick , position and vibratory sensation.  Coordination: Rapid alternating movements normal in all extremities. Finger-to-nose and heel-to-shin performed accurately bilaterally. Gait and Station: Arises from chair without difficulty. Stance is normal. Gait demonstrates normal stride length and balance with ***. Tandem walk and heel toe ***.  Reflexes: 1+ and symmetric.    NIHSS  *** Modified [place]  ***    ASSESSMENT: Kiani Standing is a 57 y.o. year old male presenting to the ER at [facility] 12/202/2023 with chest pain and noted to have dysarthria and facial droop 05/05/2022. Vascular risk factors include HTN, HLD, cardiomyopathy, CAD, tobacco use.     PLAN:  Stroke: Acute infarct in right posterior pons and subcortical left hemisphere. Etiology: likely small vessel disease : Residual deficit: ***. Continue aspirin 81 mg daily and clopidogrel 75 mg daily until cardiology evaluation and atorvastatin '40mg'$   for secondary stroke prevention.  Discussed secondary stroke prevention measures and importance of close PCP follow up for aggressive stroke risk factor management. I have gone over the pathophysiology of stroke, warning signs and symptoms, risk factors and their management in some detail with instructions to go to the closest emergency room for symptoms of concern. HTN: BP goal <130/90.  Stable on *** per PCP HLD: LDL goal <70. Recent LDL 107. Continue atorvastatin '40mg'$  daily.  DMII: A1c goal<7.0. Recent A1c 4.7. Continue healthy, well balanced diet and regular exercise.   ESRD: Continue dialysis as directed by nephrology.  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  Tobacco use: consider cessation   Follow up in *** or call earlier if needed   CC:  [facility] provider: Dr. Leonie Man PCP: Kerin Perna, NP    I spent *** minutes of face-to-face and non-face-to-face time with patient.  This included previsit chart review including review of recent hospitalization, lab review, study review, order entry, electronic health record documentation, patient education regarding recent stroke including etiology, secondary stroke prevention measures and importance of managing stroke risk factors, residual deficits and typical recovery time and answered all other questions to patient satisfaction   Debbora Presto, [facility]  [facility] [address] [place], [place] [address]  Phone [phone] Fax [phone] Note: This document was prepared  with digital dictation and possible smart phrase technology. Any transcriptional errors that result from this process are unintentional.

## 2022-06-09 ENCOUNTER — Inpatient Hospital Stay: Payer: Self-pay | Admitting: Family Medicine

## 2022-06-09 ENCOUNTER — Encounter: Payer: Self-pay | Admitting: Family Medicine

## 2022-06-09 DIAGNOSIS — G4733 Obstructive sleep apnea (adult) (pediatric): Secondary | ICD-10-CM

## 2022-06-09 DIAGNOSIS — E782 Mixed hyperlipidemia: Secondary | ICD-10-CM

## 2022-06-09 DIAGNOSIS — I639 Cerebral infarction, unspecified: Secondary | ICD-10-CM

## 2022-06-09 DIAGNOSIS — E1122 Type 2 diabetes mellitus with diabetic chronic kidney disease: Secondary | ICD-10-CM

## 2022-06-09 DIAGNOSIS — Z227 Latent tuberculosis: Secondary | ICD-10-CM

## 2022-06-09 DIAGNOSIS — N186 End stage renal disease: Secondary | ICD-10-CM

## 2022-06-10 DIAGNOSIS — R7881 Bacteremia: Secondary | ICD-10-CM | POA: Diagnosis not present

## 2022-06-10 DIAGNOSIS — N186 End stage renal disease: Secondary | ICD-10-CM | POA: Diagnosis not present

## 2022-06-10 DIAGNOSIS — Z992 Dependence on renal dialysis: Secondary | ICD-10-CM | POA: Diagnosis not present

## 2022-06-10 DIAGNOSIS — D689 Coagulation defect, unspecified: Secondary | ICD-10-CM | POA: Diagnosis not present

## 2022-06-13 DIAGNOSIS — Z992 Dependence on renal dialysis: Secondary | ICD-10-CM | POA: Diagnosis not present

## 2022-06-13 DIAGNOSIS — D689 Coagulation defect, unspecified: Secondary | ICD-10-CM | POA: Diagnosis not present

## 2022-06-13 DIAGNOSIS — R7881 Bacteremia: Secondary | ICD-10-CM | POA: Diagnosis not present

## 2022-06-13 DIAGNOSIS — N186 End stage renal disease: Secondary | ICD-10-CM | POA: Diagnosis not present

## 2022-06-14 DIAGNOSIS — R079 Chest pain, unspecified: Secondary | ICD-10-CM | POA: Diagnosis not present

## 2022-06-14 DIAGNOSIS — N186 End stage renal disease: Secondary | ICD-10-CM | POA: Diagnosis not present

## 2022-06-14 DIAGNOSIS — Z992 Dependence on renal dialysis: Secondary | ICD-10-CM | POA: Diagnosis not present

## 2022-06-15 DIAGNOSIS — N186 End stage renal disease: Secondary | ICD-10-CM | POA: Diagnosis not present

## 2022-06-15 DIAGNOSIS — Z992 Dependence on renal dialysis: Secondary | ICD-10-CM | POA: Diagnosis not present

## 2022-06-15 DIAGNOSIS — R7881 Bacteremia: Secondary | ICD-10-CM | POA: Diagnosis not present

## 2022-06-15 DIAGNOSIS — I129 Hypertensive chronic kidney disease with stage 1 through stage 4 chronic kidney disease, or unspecified chronic kidney disease: Secondary | ICD-10-CM | POA: Diagnosis not present

## 2022-06-15 DIAGNOSIS — D689 Coagulation defect, unspecified: Secondary | ICD-10-CM | POA: Diagnosis not present

## 2022-06-15 NOTE — Progress Notes (Deleted)
Cardiology Office Note:    Date:  06/15/2022   ID:  Bobbie Valletta, DOB 1965/08/12, MRN [id number]  PCP:  Kerin Perna, NP   [place] Providers Cardiologist:  Glenetta Hew, MD { Click to update primary MD,subspecialty MD or APP then REFRESH:1}    Referring MD: Kerin Perna, NP   No chief complaint on file. ***  History of Present Illness:    Jaquise Faux is a 57 y.o. male with a hx of CKD stage V, hypertension, HFpEF, GERD, DM, diabetic retinopathy, and latent TB.  He has previously followed with Dr. Ellyn Hack for an abnormal cardiovascular stress test in 2019 felt low risk when read by cardiology and abnormalities likely indicate prior ischemia. Echo 2019 with preserved EF and grade 1 DD, moderate LVH.  Given lack of anginal symptoms and CKD 5, medical management was recommended versus cardiac catheterization.  He has followed with Heart care as well as [facility] cardiology.  He was recently treated for latent TB but stopped taking medications because he felt like they were making him sick.  He was seen during a hospitalization 05/05/2022 for intermittent chest pain and orthopnea for several months. Chest pain symptoms improved with HD. Unfortunately he suffered a lacunar infarcts felt related to small vessel disease. Echo with LVEF 45-50%. GDMT titrated and volume now managed with HD.   Pt pending 30 day event monitor per neurology.   Pt with prior abnormal stress test indicating old ischemia, now with echo showing mild cardiomyopathy with WMA. CE flat inconsistent with ACS. Chest pain felt could be related to pleuritic/pericardial from uremic symptoms - CP resolved with initiation of HD. Pt now able to have heart cath, but deferred due to recent lacunar infarcts. Plan for CTA coronary vs LHC depending on symptoms.   He presents today for follow up.     CAD Suspect a component of CAD. Abnormal nuclear stress test in 2019, mild cardiomyopathy with  RWMA   Lacunar infarcts ASA, plavix, statin Felt related to small vessel disease Order 30 day monitor   Hyperlipidemia with LDL goal < 70 05/05/2022: Cholesterol 156; HDL 34; LDL Cholesterol 107; Triglycerides 76; VLDL 15 On 40 mg lipitor Recheck lipids   Chronic systolic and diastolic heart failure Hypertension GDMT: 25 mg coreg BID, hydralazine 100 mg TID, 60 mg imdur,    ESRD now on HD Per nephrology, following with [facility]         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   Depression    Diabetes mellitus without complication ([place])    type 2   GERD (gastroesophageal reflux disease)    Heart murmur    Hypertension    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 ([place]) 2015   EF now improved back to normal mildly reduced   Peripheral neuropathy    Pneumonia    PTSD (post-traumatic stress disorder)    Seizures ([place])    27 years ago   Stroke ([place]) 09/2021   Tuberculosis     Past Surgical History:  Procedure Laterality Date   AV FISTULA PLACEMENT Left 11/08/2016   Procedure: ARTERIOVENOUS (AV) FISTULA CREATION;  Surgeon: Angelia Mould, MD;  Location: [place];  Service: Vascular;  Laterality: Left;   AV FISTULA PLACEMENT Left 03/23/2020   Procedure: LEFT BASILIC VEIN FISTULA CREATION;  Surgeon: Angelia Mould, MD;  Location: [place];  Service: Vascular;  Laterality: Left;   IR FLUORO GUIDE CV LINE RIGHT  05/10/2022   IR US GUIDE VASC ACCESS RIGHT  05/10/2022   NM MYOVIEW LTD  07/2017   "Moderate sized moderate severity" (on cardiology was very small size, small intensity) partially reversible inferoapical//inferoseptal defect.  (Read as intermediate-high risk) -> over read by cardiology as [place]  09/2019   [facility] Small sized, mild severity reversible defect involving apical lateral &  mid anterolateral wall thought to be artifacts - but CRO mild ischemia.  EF 55%. => Read as LOW RISK:   right foot surgery     TOE AMPUTATION     TRANSTHORACIC ECHOCARDIOGRAM  07/2017   EF 60%.,  Moderate LVH.  GR 1 DD.  No R WMA.  Mild RV and moderate RA dilation.   TRANSTHORACIC ECHOCARDIOGRAM  10/03/2019   [facility]: EF 40%, mild MR, mild aortic calcification    Current Medications: No outpatient medications have been marked as taking for the 06/23/22 encounter (Appointment) with Ledora Bottcher, [place].     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   Drug use: Not Currently    Comment: since 2021   Sexual activity: Not Currently  Other Topics Concern   Not on file  Social History Narrative   Not on file   Social Determinants of Health   Financial Resource Strain: Not on file  Food Insecurity: No Food Insecurity (05/05/2022)   Hunger Vital Sign    Worried About Running Out of Food in the Last Year: Never true    Ran Out of Food in the Last Year: Never true  Transportation Needs: No Transportation Needs (05/05/2022)   PRAPARE - Hydrologist (Medical): No    Lack of Transportation (Non-Medical): No  Physical Activity: Not on file  Stress: No Stress Concern Present (05/27/2022)   [place]     Feeling of Stress : Only a little  Social Connections: Not on file     Family History: The patient's ***family history includes CAD in his father, mother, and sister; Colon cancer in his maternal uncle; Diabetes type II in his brother and father; Heart attack in his mother; Hypertension in his sister; Stroke in his father. There is no history of Colon polyps, Esophageal cancer, Stomach cancer, or Rectal cancer.  ROS:   Please see the history of present illness.    *** All other systems reviewed and are negative.  EKGs/Labs/Other Studies Reviewed:    The following studies were reviewed today: ***  EKG:  EKG is *** ordered  today.  The ekg ordered today demonstrates ***  Recent Labs: 03/29/2022: ALT 10 05/04/2022: B Natriuretic Peptide 2,226.4 05/12/2022: BUN 33; Creatinine, Ser 6.41; Hemoglobin 8.0; Platelets 184; Potassium 4.3; Sodium 135  Recent Lipid Panel    Component Value Date/Time   CHOL 156 05/05/2022 0025   CHOL 179 04/07/2021 1134   TRIG 76 05/05/2022 0025   HDL 34 (L) 05/05/2022 0025   HDL 48 04/07/2021 1134   CHOLHDL 4.6 05/05/2022 0025   VLDL 15 05/05/2022 0025   LDLCALC 107 (H) 05/05/2022 0025   LDLCALC 117 (H) 04/07/2021 1134     Risk Assessment/Calculations:   {Does this patient have ATRIAL FIBRILLATION?[phone]}  No BP recorded.  {Refresh Note OR Click here to enter BP  :1}***         Physical Exam:    VS:  There were no vitals taken for this visit.    Wt Readings from Last 3 Encounters:  05/12/22 160 lb 11.5 oz (72.9 kg)  03/29/22 177 lb (80.3 kg)  04/30/21 189 lb 6.4 oz (85.9 kg)     GEN: *** Well nourished, well developed in no acute distress HEENT: Normal NECK: No JVD; No carotid bruits LYMPHATICS: No lymphadenopathy CARDIAC: ***RRR, no murmurs, rubs, gallops RESPIRATORY:  Clear to auscultation without rales, wheezing or rhonchi  ABDOMEN: Soft, non-tender, non-distended MUSCULOSKELETAL:  No edema; No deformity  SKIN: Warm and  dry NEUROLOGIC:  Alert and oriented x 3 PSYCHIATRIC:  Normal affect   ASSESSMENT:    No diagnosis found. PLAN:    In order of problems listed above:  ***      {Are you ordering a CV Procedure (e.g. stress test, cath, DCCV, TEE, etc)?   Press F2        :[id number]}    Medication Adjustments/Labs and Tests Ordered: Current medicines are reviewed at length with the patient today.  Concerns regarding medicines are outlined above.  No orders of the defined types were placed in this encounter.  No orders of the defined types were placed in this encounter.   There are no Patient Instructions on file for this visit.   Signed, Ledora Bottcher, PA  06/15/2022 2:13 PM    [facility]

## 2022-06-17 DIAGNOSIS — D689 Coagulation defect, unspecified: Secondary | ICD-10-CM | POA: Diagnosis not present

## 2022-06-17 DIAGNOSIS — R7881 Bacteremia: Secondary | ICD-10-CM | POA: Diagnosis not present

## 2022-06-17 DIAGNOSIS — Z992 Dependence on renal dialysis: Secondary | ICD-10-CM | POA: Diagnosis not present

## 2022-06-17 DIAGNOSIS — N186 End stage renal disease: Secondary | ICD-10-CM | POA: Diagnosis not present

## 2022-06-20 DIAGNOSIS — R7881 Bacteremia: Secondary | ICD-10-CM | POA: Diagnosis not present

## 2022-06-20 DIAGNOSIS — D689 Coagulation defect, unspecified: Secondary | ICD-10-CM | POA: Diagnosis not present

## 2022-06-20 DIAGNOSIS — N186 End stage renal disease: Secondary | ICD-10-CM | POA: Diagnosis not present

## 2022-06-20 DIAGNOSIS — Z992 Dependence on renal dialysis: Secondary | ICD-10-CM | POA: Diagnosis not present

## 2022-06-21 NOTE — Telephone Encounter (Signed)
Patient returned call and explained the necessary clearance needed prior to surgery. Patient verbalized understanding. Assisted patient with rescheduling neurology appt for 07/14/22 at 0830/arriving by 0800 at [facility]. Also, reminded patient of cardiology appt this week on 06/23/22. Patient verbalized understanding to all information provided.

## 2022-06-21 NOTE — Telephone Encounter (Signed)
Noted cardiology appointment is rescheduled for 06/23/22 and shows patient no showed for neurology appointment on 06/09/22. Attempted to reach patient to advise he would need to be cleared by both prior to rescheduling surgery. DPR on file- Left a detailed message with this information and to contact our office for any additional questions.

## 2022-06-23 ENCOUNTER — Ambulatory Visit: Payer: Medicaid Other | Attending: Physician Assistant | Admitting: Physician Assistant

## 2022-06-24 ENCOUNTER — Other Ambulatory Visit: Payer: Medicaid Other | Admitting: Obstetrics and Gynecology

## 2022-06-24 DIAGNOSIS — R7881 Bacteremia: Secondary | ICD-10-CM | POA: Diagnosis not present

## 2022-06-24 DIAGNOSIS — D689 Coagulation defect, unspecified: Secondary | ICD-10-CM | POA: Diagnosis not present

## 2022-06-24 DIAGNOSIS — N186 End stage renal disease: Secondary | ICD-10-CM | POA: Diagnosis not present

## 2022-06-24 DIAGNOSIS — Z992 Dependence on renal dialysis: Secondary | ICD-10-CM | POA: Diagnosis not present

## 2022-06-24 NOTE — Patient Instructions (Signed)
Visit Information  [name]  - as a part of your Medicaid benefit, you are eligible for care management and care coordination services at no cost or copay. I was unable to reach you by phone today but would be happy to help you with your health related needs. Please feel free to call me at [phone].   A member of the Managed Medicaid care management team will reach out to you again over the next 30 business  days.   [name] RN, BSN [place]  [facility] - Managed Medicaid High Risk [phone]

## 2022-06-24 NOTE — Patient Outreach (Signed)
Care Coordination  06/24/2022  Cody Webster 25-Dec-1965 [id number]   Medicaid Managed Care   Unsuccessful Outreach Note  06/24/2022 Name: Cody Webster MRN: [id number] DOB: 01/30/1966  Referred by: Kerin Perna, NP Reason for referral : High Risk Managed Medicaid (Unsuccessful telephone outreach)   An unsuccessful telephone outreach was attempted today. The patient was referred to the case management team for assistance with care management and care coordination.   Follow Up Plan: The patient has been provided with contact information for the care management team and has been advised to call with any health related questions or concerns.  The care management team will reach out to the patient again over the next 30 business  days.   [name] RN, BSN   [facility] - Managed Medicaid High Risk [phone]

## 2022-06-27 DIAGNOSIS — N186 End stage renal disease: Secondary | ICD-10-CM | POA: Diagnosis not present

## 2022-06-27 DIAGNOSIS — R7881 Bacteremia: Secondary | ICD-10-CM | POA: Diagnosis not present

## 2022-06-27 DIAGNOSIS — D689 Coagulation defect, unspecified: Secondary | ICD-10-CM | POA: Diagnosis not present

## 2022-06-27 DIAGNOSIS — Z992 Dependence on renal dialysis: Secondary | ICD-10-CM | POA: Diagnosis not present

## 2022-06-29 ENCOUNTER — Other Ambulatory Visit: Payer: Medicaid Other

## 2022-06-29 DIAGNOSIS — D689 Coagulation defect, unspecified: Secondary | ICD-10-CM | POA: Diagnosis not present

## 2022-06-29 DIAGNOSIS — R7881 Bacteremia: Secondary | ICD-10-CM | POA: Diagnosis not present

## 2022-06-29 DIAGNOSIS — Z992 Dependence on renal dialysis: Secondary | ICD-10-CM | POA: Diagnosis not present

## 2022-06-29 DIAGNOSIS — N186 End stage renal disease: Secondary | ICD-10-CM | POA: Diagnosis not present

## 2022-06-29 NOTE — Patient Outreach (Signed)
  Medicaid Managed Care   Unsuccessful Outreach Note  06/29/2022 Name: Cody Webster MRN: [id number] DOB: 01/10/1966  Referred by: Kerin Perna, NP Reason for referral : High Risk Managed Medicaid (MM social work telephone outreach)   An unsuccessful telephone outreach was attempted today. The patient was referred to the case management team for assistance with care management and care coordination.   Follow Up Plan: A HIPAA compliant phone message was left for the patient providing contact information and requesting a return call.   Mickel Fuchs, BSW, [place] Managed Medicaid Team  [phone]

## 2022-06-29 NOTE — Patient Instructions (Signed)
  Medicaid Managed Care   Unsuccessful Outreach Note  06/29/2022 Name: Cody Webster MRN: [id number] DOB: 10/04/1965  Referred by: Kerin Perna, NP Reason for referral : High Risk Managed Medicaid (MM social work telephone outreach)   An unsuccessful telephone outreach was attempted today. The patient was referred to the case management team for assistance with care management and care coordination.   Follow Up Plan: A HIPAA compliant phone message was left for the patient providing contact information and requesting a return call.   Mickel Fuchs, BSW, [place] Managed Medicaid Team  [phone]

## 2022-06-30 ENCOUNTER — Ambulatory Visit: Payer: Medicaid Other

## 2022-07-01 DIAGNOSIS — Z992 Dependence on renal dialysis: Secondary | ICD-10-CM | POA: Diagnosis not present

## 2022-07-01 DIAGNOSIS — N186 End stage renal disease: Secondary | ICD-10-CM | POA: Diagnosis not present

## 2022-07-01 DIAGNOSIS — D689 Coagulation defect, unspecified: Secondary | ICD-10-CM | POA: Diagnosis not present

## 2022-07-01 DIAGNOSIS — R7881 Bacteremia: Secondary | ICD-10-CM | POA: Diagnosis not present

## 2022-07-04 ENCOUNTER — Other Ambulatory Visit (INDEPENDENT_AMBULATORY_CARE_PROVIDER_SITE_OTHER): Payer: Self-pay | Admitting: Primary Care

## 2022-07-04 DIAGNOSIS — R7881 Bacteremia: Secondary | ICD-10-CM | POA: Diagnosis not present

## 2022-07-04 DIAGNOSIS — N186 End stage renal disease: Secondary | ICD-10-CM | POA: Diagnosis not present

## 2022-07-04 DIAGNOSIS — D689 Coagulation defect, unspecified: Secondary | ICD-10-CM | POA: Diagnosis not present

## 2022-07-04 DIAGNOSIS — Z992 Dependence on renal dialysis: Secondary | ICD-10-CM | POA: Diagnosis not present

## 2022-07-05 ENCOUNTER — Telehealth: Payer: Self-pay

## 2022-07-05 NOTE — Telephone Encounter (Signed)
..   Medicaid Managed Care   Unsuccessful Outreach Note  07/05/2022 Name: Cody Webster MRN: [id number] DOB: 02/06/1966  Referred by: Kerin Perna, NP Reason for referral : Appointment (I called the patient today to get him rescheduled for his missed phone appt with the MM BSW. I left my name and number on his VM.)   A second unsuccessful telephone outreach was attempted today. The patient was referred to the case management team for assistance with care management and care coordination.   Follow Up Plan: A HIPAA compliant phone message was left for the patient providing contact information and requesting a return call.  The care management team will reach out to the patient again over the next 7 days.    [place]

## 2022-07-06 DIAGNOSIS — N186 End stage renal disease: Secondary | ICD-10-CM | POA: Diagnosis not present

## 2022-07-06 DIAGNOSIS — D689 Coagulation defect, unspecified: Secondary | ICD-10-CM | POA: Diagnosis not present

## 2022-07-06 DIAGNOSIS — Z992 Dependence on renal dialysis: Secondary | ICD-10-CM | POA: Diagnosis not present

## 2022-07-06 DIAGNOSIS — R7881 Bacteremia: Secondary | ICD-10-CM | POA: Diagnosis not present

## 2022-07-08 DIAGNOSIS — N186 End stage renal disease: Secondary | ICD-10-CM | POA: Diagnosis not present

## 2022-07-08 DIAGNOSIS — D689 Coagulation defect, unspecified: Secondary | ICD-10-CM | POA: Diagnosis not present

## 2022-07-08 DIAGNOSIS — Z992 Dependence on renal dialysis: Secondary | ICD-10-CM | POA: Diagnosis not present

## 2022-07-08 DIAGNOSIS — R7881 Bacteremia: Secondary | ICD-10-CM | POA: Diagnosis not present

## 2022-07-11 ENCOUNTER — Telehealth: Payer: Self-pay

## 2022-07-11 DIAGNOSIS — D631 Anemia in chronic kidney disease: Secondary | ICD-10-CM | POA: Diagnosis not present

## 2022-07-11 DIAGNOSIS — D689 Coagulation defect, unspecified: Secondary | ICD-10-CM | POA: Diagnosis not present

## 2022-07-11 DIAGNOSIS — R7881 Bacteremia: Secondary | ICD-10-CM | POA: Diagnosis not present

## 2022-07-11 DIAGNOSIS — N186 End stage renal disease: Secondary | ICD-10-CM | POA: Diagnosis not present

## 2022-07-11 DIAGNOSIS — Z992 Dependence on renal dialysis: Secondary | ICD-10-CM | POA: Diagnosis not present

## 2022-07-11 NOTE — Telephone Encounter (Signed)
..   Medicaid Managed Care   Unsuccessful Outreach Note  07/11/2022 Name: Cody Webster MRN: [id number] DOB: 1966/03/15  Referred by: Kerin Perna, NP Reason for referral : Appointment (I called the patient today to reschedule his missed appointment with the MM BSW. I left my name and number on his VM.)   A second unsuccessful telephone outreach was attempted today. The patient was referred to the case management team for assistance with care management and care coordination.   Follow Up Plan: A HIPAA compliant phone message was left for the patient providing contact information and requesting a return call.  The care management team will reach out to the patient again over the next 7 days.   [place]

## 2022-07-13 ENCOUNTER — Inpatient Hospital Stay: Payer: Self-pay | Admitting: Family Medicine

## 2022-07-13 DIAGNOSIS — D689 Coagulation defect, unspecified: Secondary | ICD-10-CM | POA: Diagnosis not present

## 2022-07-13 DIAGNOSIS — D631 Anemia in chronic kidney disease: Secondary | ICD-10-CM | POA: Diagnosis not present

## 2022-07-13 DIAGNOSIS — Z992 Dependence on renal dialysis: Secondary | ICD-10-CM | POA: Diagnosis not present

## 2022-07-13 DIAGNOSIS — R7881 Bacteremia: Secondary | ICD-10-CM | POA: Diagnosis not present

## 2022-07-13 DIAGNOSIS — N186 End stage renal disease: Secondary | ICD-10-CM | POA: Diagnosis not present

## 2022-07-13 NOTE — Progress Notes (Unsigned)
[facility] [address] [place] [address] [phone]       HOSPITAL FOLLOW UP NOTE  Mr. Datwan Musselwhite Date of Birth:  1966/03/11 Medical Record Number:  [id number]   Reason for Referral:  hospital stroke follow up    SUBJECTIVE:   CHIEF COMPLAINT:  No chief complaint on file.   HPI:   Krischan Dillahunt is a 57 y.o. 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  Patient presented on 05/04/2022 with complaints of chest pain. She was admitted with plans to start dialysis. On 05/05/2022 he was noted to have dysarthria and left facial droop and code stroke called. CT showed small vessel ischemia. MRI showed mild acute infarct in right posterior pons and left hemisphere. Asa continued and Plavix added for 3 weeks then plan to continue Plavix only. Atorvastatin '40mg'$  daily added. LDL 107. Personally reviewed hospitalization pertinent progress notes, lab work and imaging.  Evaluated by Dr Leonie Man and Dr Erlinda Hong.   Since discharge,   Nephrology recommended hemodialysis. Previous AV fistula not sufficient. He was scheduled for right brachiocephalic AV fistula placement with vascular surgery 05/30/2022 but procedure was cancelled.   Cardiology recommended outpatient follow up with plans to perform heart cath.   Smoking?   PERTINENT IMAGING/LABS  CT head No acute abnormality. Small vessel disease.  MRI small acute infarct in right posterior pons and subcortical left hemisphere MRA pending Carotid Doppler 40 to 59% stenosis in bilateral ICAs 2D Echo EF 45 to 50%, mild  LVH, grade 2 diastolic dysfunction, severely dilated left atrium, no atrial level shunt Will consider 30-day cardiac monitor as outpt given left atrial dilation   A1C Lab Results  Component Value Date   HGBA1C 5.1 05/06/2022    Lipid Panel     Component Value Date/Time   CHOL 156 05/05/2022 0025   CHOL 179 04/07/2021 1134   TRIG 76 05/05/2022 0025   HDL 34 (L) 05/05/2022 0025   HDL 48 04/07/2021 1134   CHOLHDL 4.6 05/05/2022 0025   VLDL 15 05/05/2022 0025   LDLCALC 107 (H) 05/05/2022 0025   LDLCALC 117 (H) 04/07/2021 1134   LABVLDL 14 04/07/2021 1134      ROS:   14 system review of systems performed and negative with exception of those listed in HPI  PMH:  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 ([place]) 2015   EF now improved back to normal mildly reduced   Peripheral neuropathy    Pneumonia    PTSD (post-traumatic stress disorder)    Seizures ([place])    27 years ago   Stroke ([place]) 09/2021   Tuberculosis     PSH:  Past Surgical History:  Procedure Laterality Date   AV FISTULA PLACEMENT Left 11/08/2016   Procedure: ARTERIOVENOUS (AV) FISTULA CREATION;  Surgeon:  Angelia Mould, MD;  Location[facility];  Service: Vascular;  Laterality: Left;   AV FISTULA PLACEMENT Left 03/23/2020   Procedure: LEFT BASILIC VEIN FISTULA CREATION;  Surgeon: Angelia Mould, MD;  Location: [facility];  Service: Vascular;  Laterality: Left;   IR FLUORO GUIDE CV LINE RIGHT  05/10/2022   IR US GUIDE VASC ACCESS RIGHT  05/10/2022   NM MYOVIEW LTD  07/2017   "Moderate sized moderate severity" (on cardiology was very small size, small intensity)  partially reversible inferoapical//inferoseptal defect.  (Read as intermediate-high risk) -> over read by cardiology as [place]  09/2019   [facility] Small sized, mild severity reversible defect involving apical lateral & mid anterolateral wall thought to be artifacts - but CRO mild ischemia.  EF 55%. => Read as LOW RISK:   right foot surgery     TOE AMPUTATION     TRANSTHORACIC ECHOCARDIOGRAM  07/2017   EF 60%.,  Moderate LVH.  GR 1 DD.  No R WMA.  Mild RV and moderate RA dilation.   TRANSTHORACIC ECHOCARDIOGRAM  10/03/2019   [facility]: EF 40%, mild MR, mild aortic calcification    Social History:  Social History   Socioeconomic History   Marital status: Single    Spouse name: Not on file   Number of children: 1   Years of education: Not on file   Highest education level: Not on file  Occupational History    Comment: Disabled - for Back pain & foot ulcer  Tobacco Use   Smoking status: Every Day    Packs/day: 0.25    Years: 30.00    Total pack years: 7.50    Types: Cigarettes   Smokeless tobacco: Former    Types: Snuff, Chew    Quit date: 12/11/1983  Vaping Use   Vaping Use: Never used  Substance and Sexual Activity   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   Drug use: Not Currently    Comment: since 2021   Sexual activity: Not Currently  Other Topics Concern   Not on file  Social History Narrative   Not on file   Social Determinants of Health   Financial Resource Strain: Not on file  Food Insecurity: No Food Insecurity (05/05/2022)   Hunger Vital Sign    Worried About Running Out of Food in the Last Year: Never true    Ran Out of Food in the Last Year: Never true  Transportation Needs: No Transportation Needs (05/05/2022)   PRAPARE - Hydrologist (Medical): No    Lack of Transportation (Non-Medical): No  Physical Activity: Not on file  Stress: No Stress Concern Present (05/27/2022)   [place]    Feeling of Stress : Only a little  Social Connections: Not on file  Intimate Partner Violence: Not At Risk (05/05/2022)   Humiliation, Afraid, Rape, and Kick questionnaire    Fear of Current or Ex-Partner: No    Emotionally Abused: No    Physically Abused: No    Sexually Abused: No    Family History:  Family History  Problem Relation Age of Onset   CAD Mother    Heart attack Mother    Diabetes type II Father    Stroke Father    CAD Father    Hypertension Sister    CAD Sister    Diabetes type II Brother  Colon cancer Maternal Uncle        2015   Colon polyps Neg Hx    Esophageal cancer Neg Hx    Stomach cancer Neg Hx    Rectal cancer Neg Hx     Medications:   Current Outpatient Medications on File Prior to Visit  Medication Sig Dispense Refill   acetaminophen (TYLENOL) 500 MG tablet Take 1,000 mg by mouth every 6 (six) hours as needed (for pain/headaches.). (Patient not taking: Reported on 05/27/2022)     aspirin EC 81 MG tablet Take 81 mg by mouth every evening.     atorvastatin (LIPITOR) 40 MG tablet Take 1 tablet (40 mg total) by mouth daily. (Patient not taking: Reported on 05/27/2022) 30 tablet 0   carvedilol (COREG) 25 MG tablet Take 1 tablet (25 mg total) by mouth 2 (two) times daily with a meal. 60 tablet 0   cetirizine (ZYRTEC) 10 MG tablet TAKE 1 TABLET (10 MG TOTAL) BY MOUTH DAILY. 90 tablet 6   cloNIDine (CATAPRES) 0.1 MG tablet TAKE ONE (1) TABLET BY MOUTH THREE TIMES DAILY. 90 tablet 1   fluticasone (FLONASE) 50 MCG/ACT nasal spray Place 2 sprays into both nostrils daily. (Patient not taking: Reported on 05/27/2022) 16 g 6   gabapentin (NEURONTIN) 300 MG capsule Take 1 capsule (300 mg total) by mouth 2 (two) times daily. (Patient not taking: Reported on 05/27/2022) 180 capsule 1   guaiFENesin (MUCINEX) 600 MG 12 hr tablet Take 1 tablet (600 mg total) by mouth 2 (two) times daily as needed for  to loosen phlegm. (Patient not taking: Reported on 05/27/2022) 60 tablet 1   hydrALAZINE (APRESOLINE) 50 MG tablet Take 2 tablets (100 mg total) by mouth 3 (three) times daily. (Patient not taking: Reported on 05/27/2022) 180 tablet 0   isoniazid (NYDRAZID) 300 MG tablet Take 1 tablet (300 mg total) by mouth daily. (Patient not taking: Reported on 05/27/2022) 30 tablet 5   isosorbide mononitrate (IMDUR) 60 MG 24 hr tablet Take 1 tablet (60 mg total) by mouth daily. (Patient not taking: Reported on 05/27/2022) 30 tablet 0   mirtazapine (REMERON) 7.5 MG tablet Take 1 tablet (7.5 mg total) by mouth at bedtime. (Patient not taking: Reported on 05/27/2022) 30 tablet 0   nitroGLYCERIN (NITROSTAT) 0.4 MG SL tablet Place 1 tablet (0.4 mg total) under the tongue every 5 (five) minutes as needed for chest pain (CP or SOB). (Patient not taking: Reported on 05/27/2022) 30 tablet 0   pyridOXINE (VITAMIN B6) 50 MG tablet Take 1 tablet (50 mg total) by mouth daily. (Patient not taking: Reported on 05/04/2022) 30 tablet 5   traZODone (DESYREL) 100 MG tablet TAKE 1/2 TO 1 TABLET BY MOUTH AT BEDTIME AS NEEDED FOR SLEEP. 90 tablet 1   No current facility-administered medications on file prior to visit.    Allergies:   Allergies  Allergen Reactions   Morphine And Related Shortness Of Breath and Other (See Comments)    Hallucination  Tolerates Norco/Vicodin      OBJECTIVE:  Physical Exam  There were no vitals filed for this visit. There is no height or weight on file to calculate BMI. No results found.     05/27/2020    3:30 PM  Depression screen PHQ 2/9  Decreased Interest 1  PHQ - 2 Score 1  Altered sleeping 3  Tired, decreased energy 0  Change in appetite 0  Feeling bad or failure about yourself  0  Trouble concentrating 0  Moving  slowly or fidgety/restless 0  Suicidal thoughts 0  PHQ-9 Score 4     General: well developed, well nourished, seated, in no evident distress Head: head normocephalic  and atraumatic.   Neck: supple with no carotid or supraclavicular bruits Cardiovascular: regular rate and rhythm, no murmurs Musculoskeletal: no deformity Skin:  no rash/petichiae Vascular:  Normal pulses all extremities   Neurologic Exam Mental Status: Awake and fully alert.  Fluent speech and language.  Oriented to place and time. Recent and remote memory intact. Attention span, concentration and fund of knowledge appropriate. Mood and affect appropriate.  Cranial Nerves: Fundoscopic exam reveals sharp disc margins. Pupils equal, briskly reactive to light. Extraocular movements full without nystagmus. Visual fields full to confrontation. Hearing intact. Facial sensation intact. Face, tongue, palate moves normally and symmetrically.  Motor: Normal bulk and tone. Normal strength in all tested extremity muscles Sensory.: intact to touch , pinprick , position and vibratory sensation.  Coordination: Rapid alternating movements normal in all extremities. Finger-to-nose and heel-to-shin performed accurately bilaterally. Gait and Station: Arises from chair without difficulty. Stance is normal. Gait demonstrates normal stride length and balance with ***. Tandem walk and heel toe ***.  Reflexes: 1+ and symmetric.    NIHSS  *** Modified Rankin  ***    ASSESSMENT: Remijio Harvard is a 57 y.o. year old male ***. Vascular risk factors include ***.      PLAN:  Stroke: Infarcts in right posterior pons and subcortical left hemisphere lacunar infarcts from small vessel disease   : Residual deficit: ***. Continue {anticoagulants:31417}  and ***  for secondary stroke prevention.  Discussed secondary stroke prevention measures and importance of close PCP follow up for aggressive stroke risk factor management. I have gone over the pathophysiology of stroke, warning signs and symptoms, risk factors and their management in some detail with instructions to go to the closest emergency room for symptoms of  concern. HTN: BP goal <130/90.  Stable on *** per PCP HLD: LDL goal <70. Recent LDL ***.  DMII: A1c goal<7.0. Recent A1c ***.    Follow up in *** or call earlier if needed   CC:  [facility] provider: Dr. Leonie Man PCP: Kerin Perna, NP    I spent *** minutes of face-to-face and non-face-to-face time with patient.  This included previsit chart review including review of recent hospitalization, lab review, study review, order entry, electronic health record documentation, patient education regarding recent stroke including etiology, secondary stroke prevention measures and importance of managing stroke risk factors, residual deficits and typical recovery time and answered all other questions to patient satisfaction   Debbora Presto, [facility]  [facility] [address] [place], [place] [address]  Phone [phone] Fax [phone] Note: This document was prepared with digital dictation and possible smart phrase technology. Any transcriptional errors that result from this process are unintentional.

## 2022-07-13 NOTE — Patient Instructions (Signed)

## 2022-07-14 ENCOUNTER — Encounter: Payer: Self-pay | Admitting: Family Medicine

## 2022-07-14 ENCOUNTER — Ambulatory Visit: Payer: Medicaid Other | Admitting: Family Medicine

## 2022-07-14 VITALS — BP 128/66 | HR 83 | Ht 73.0 in | Wt 173.0 lb

## 2022-07-14 DIAGNOSIS — I5041 Acute combined systolic (congestive) and diastolic (congestive) heart failure: Secondary | ICD-10-CM | POA: Diagnosis not present

## 2022-07-14 DIAGNOSIS — E782 Mixed hyperlipidemia: Secondary | ICD-10-CM

## 2022-07-14 DIAGNOSIS — N186 End stage renal disease: Secondary | ICD-10-CM

## 2022-07-14 DIAGNOSIS — Z992 Dependence on renal dialysis: Secondary | ICD-10-CM

## 2022-07-14 DIAGNOSIS — I132 Hypertensive heart and chronic kidney disease with heart failure and with stage 5 chronic kidney disease, or end stage renal disease: Secondary | ICD-10-CM

## 2022-07-14 DIAGNOSIS — I129 Hypertensive chronic kidney disease with stage 1 through stage 4 chronic kidney disease, or unspecified chronic kidney disease: Secondary | ICD-10-CM | POA: Diagnosis not present

## 2022-07-14 DIAGNOSIS — I639 Cerebral infarction, unspecified: Secondary | ICD-10-CM

## 2022-07-15 DIAGNOSIS — R7881 Bacteremia: Secondary | ICD-10-CM | POA: Diagnosis not present

## 2022-07-15 DIAGNOSIS — N186 End stage renal disease: Secondary | ICD-10-CM | POA: Diagnosis not present

## 2022-07-15 DIAGNOSIS — D689 Coagulation defect, unspecified: Secondary | ICD-10-CM | POA: Diagnosis not present

## 2022-07-15 DIAGNOSIS — Z992 Dependence on renal dialysis: Secondary | ICD-10-CM | POA: Diagnosis not present

## 2022-07-18 DIAGNOSIS — D689 Coagulation defect, unspecified: Secondary | ICD-10-CM | POA: Diagnosis not present

## 2022-07-18 DIAGNOSIS — R7881 Bacteremia: Secondary | ICD-10-CM | POA: Diagnosis not present

## 2022-07-18 DIAGNOSIS — N186 End stage renal disease: Secondary | ICD-10-CM | POA: Diagnosis not present

## 2022-07-18 DIAGNOSIS — Z992 Dependence on renal dialysis: Secondary | ICD-10-CM | POA: Diagnosis not present

## 2022-07-20 DIAGNOSIS — N186 End stage renal disease: Secondary | ICD-10-CM | POA: Diagnosis not present

## 2022-07-20 DIAGNOSIS — D689 Coagulation defect, unspecified: Secondary | ICD-10-CM | POA: Diagnosis not present

## 2022-07-20 DIAGNOSIS — R7881 Bacteremia: Secondary | ICD-10-CM | POA: Diagnosis not present

## 2022-07-20 DIAGNOSIS — Z992 Dependence on renal dialysis: Secondary | ICD-10-CM | POA: Diagnosis not present

## 2022-07-21 ENCOUNTER — Encounter: Payer: Self-pay | Admitting: Orthopedic Surgery

## 2022-07-21 ENCOUNTER — Ambulatory Visit: Payer: Medicaid Other | Admitting: Orthopedic Surgery

## 2022-07-21 DIAGNOSIS — B351 Tinea unguium: Secondary | ICD-10-CM | POA: Diagnosis not present

## 2022-07-21 DIAGNOSIS — L97521 Non-pressure chronic ulcer of other part of left foot limited to breakdown of skin: Secondary | ICD-10-CM

## 2022-07-21 NOTE — Progress Notes (Signed)
Office Visit Note   Patient: Cody Webster           Date of Birth: 03/09/1966           MRN: [id number] Visit Date: 07/21/2022              Requested by: Kerin Perna, NP [place],   [id number] PCP: Kerin Perna, NP  Chief Complaint  Patient presents with   Other     3 month follow up bilateral feet for possible toenail and callus trim      HPI: Patient is a 57 year old gentleman who presents in follow-up for his left foot.  Patient still undergoes dialysis.  Patient states he still has neuropathy.  Patient complains of pain with weightbearing on the left foot.  Assessment & Plan: Visit Diagnoses:  1. Onychomycosis   2. Non-pressure chronic ulcer of other part of left foot limited to breakdown of skin ([place])     Plan: Nails were trimmed x 5 ulcer debrided x 1.  Follow-Up Instructions: Return in about 3 months (around 10/21/2022).   Ortho Exam  Patient is alert, oriented, no adenopathy, well-dressed, normal affect, normal respiratory effort. Examination patient has thickened discolored onychomycotic nails x 5 on the left foot he states they are painful and he is unable to safely trim them on his own.  Patient complains of pain with weightbearing with the ulcer beneath the fifth metatarsal head left foot.  There is no redness no cellulitis no ischemic changes.  After informed consent the nails were trimmed x 5 without complication.  No signs of infection.  A 10 blade knife was used to debride the skin and soft tissue back to healthy viable tissue fifth metatarsal head left foot.  After debridement the ulcer is 10 mm in diameter 1 mm deep.  Imaging: No results found. No images are attached to the encounter.  Labs: Lab Results  Component Value Date   HGBA1C 5.1 05/06/2022   HGBA1C 4.7 04/30/2021   HGBA1C 4.7 09/25/2019   ESRSEDRATE 25 (H) 12/10/2013   CRP <0.5 (L) 12/10/2013   REPTSTATUS 04/30/2016 FINAL 04/24/2016   REPTSTATUS  04/30/2016 FINAL 04/24/2016   CULT  04/24/2016    NO GROWTH 5 DAYS Performed at [place] [facility]  04/24/2016    NO GROWTH 5 DAYS Performed at [facility]      Lab Results  Component Value Date   ALBUMIN 2.9 (L) 05/12/2022   ALBUMIN 3.2 (L) 05/11/2022   ALBUMIN 3.0 (L) 05/10/2022    Lab Results  Component Value Date   MG 1.7 04/27/2016   MG 1.9 04/26/2016   MG 1.8 04/25/2016   No results found for: "VD25OH"  No results found for: "PREALBUMIN"    Latest Ref Rng & Units 05/12/2022    8:54 AM 05/10/2022   12:37 AM 05/06/2022   12:26 AM  CBC EXTENDED  WBC 4.0 - 10.5 K/uL 4.7  5.7  4.5   RBC 4.22 - 5.81 MIL/uL 2.55  2.77  2.82   Hemoglobin 13.0 - 17.0 g/dL 8.0  8.7  8.7   HCT 39.0 - 52.0 % 25.2  27.3  27.4   Platelets 150 - 400 K/uL 184  202  178      There is no height or weight on file to calculate BMI.  Orders:  No orders of the defined types were placed in this encounter.  No orders of the  defined types were placed in this encounter.    Procedures: No procedures performed  Clinical Data: No additional findings.  ROS:  All other systems negative, except as noted in the HPI. Review of Systems  Objective: Vital Signs: There were no vitals taken for this visit.  Specialty Comments:  No specialty comments available.  PMFS History: Patient Active Problem List   Diagnosis Date Noted   Elevated troponin 05/09/2022   Unstable angina ([place]) 05/09/2022   Acute ischemic stroke ([place]) 05/06/2022   Acute congestive heart failure ([place]) 05/06/2022   CKD (chronic kidney disease), stage V ([place]) 05/05/2022   Nonspecific chest pain Q000111Q   Metabolic acidosis Q000111Q   TB lung, latent 03/29/2022   OSA (obstructive sleep apnea) 12/05/2019   Chronic foot pain, right 04/02/2018   DDD (degenerative disc disease), lumbar 04/02/2018   Abnormal liver enzymes 03/30/2018   Abnormal cardiovascular stress test 02/08/2018   Preoperative  cardiovascular examination 11/04/2016   Malnutrition of moderate degree ([place]) 01/12/2015   DM type 2, uncontrolled, with neuropathy 01/11/2015   Glaucoma    Eye pain    Acute glaucoma of right eye 09/06/2014   Acute renal failure superimposed on chronic kidney disease ([place]) 09/06/2014   Symptomatic anemia 09/06/2014   Tobacco use disorder 09/06/2014   Frequent headaches 07/11/2014   CKD (chronic kidney disease) stage 5, GFR less than 15 ml/min ([place]) 07/11/2014   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 ([place]) 07/11/2014   Ataxia 07/11/2014   CKD (chronic kidney disease) 12/11/2013   Diabetic foot ulcer ([place]) 12/10/2013   Essential hypertension 12/10/2013   Peripheral neuropathy ([place]) 12/10/2013   Pain, dental 10/08/2013   Osteomyelitis of ankle or foot, right, acute ([place]) 08/30/2013   Hyperlipidemia 10/20/2012   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 ([place]) 2015   EF now improved back to normal mildly reduced   Peripheral neuropathy    Pneumonia    PTSD (post-traumatic stress disorder)    Seizures ([place])    27 years ago   Stroke [facility]) 09/2021   Tuberculosis     Family History  Problem Relation Age of Onset   CAD Mother    Heart attack Mother    Diabetes type II Father    Stroke Father    CAD Father    Hypertension Sister    CAD Sister    Diabetes type II Brother    Colon cancer Maternal Uncle        2015   Colon polyps Neg Hx    Esophageal cancer Neg Hx    Stomach cancer Neg Hx    Rectal cancer Neg Hx     Past Surgical History:   Procedure Laterality Date   AV FISTULA PLACEMENT Left 11/08/2016   Procedure: ARTERIOVENOUS (AV) FISTULA CREATION;  Surgeon: Angelia Mould, MD;  Location: [place];  Service: Vascular;  Laterality: Left;   AV FISTULA PLACEMENT Left 03/23/2020   Procedure: LEFT BASILIC VEIN FISTULA CREATION;  Surgeon: Angelia Mould, MD;  Location: [facility] OR;  Service: Vascular;  Laterality: Left;   IR FLUORO GUIDE CV LINE RIGHT  05/10/2022   IR US GUIDE VASC ACCESS RIGHT  05/10/2022   NM MYOVIEW LTD  07/2017   "Moderate sized moderate severity" (on cardiology was very small size, small intensity) partially reversible inferoapical//inferoseptal defect.  (Read as intermediate-high risk) -> over read by cardiology as [place]  09/2019   [facility] Small sized, mild severity reversible defect involving apical lateral & mid anterolateral wall thought to be artifacts - but CRO mild ischemia.  EF 55%. => Read as LOW RISK:   right foot surgery     TOE AMPUTATION     TRANSTHORACIC ECHOCARDIOGRAM  07/2017   EF 60%.,  Moderate LVH.  GR 1 DD.  No R WMA.  Mild RV and moderate RA dilation.   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

## 2022-07-22 DIAGNOSIS — Z992 Dependence on renal dialysis: Secondary | ICD-10-CM | POA: Diagnosis not present

## 2022-07-22 DIAGNOSIS — D689 Coagulation defect, unspecified: Secondary | ICD-10-CM | POA: Diagnosis not present

## 2022-07-22 DIAGNOSIS — N186 End stage renal disease: Secondary | ICD-10-CM | POA: Diagnosis not present

## 2022-07-22 DIAGNOSIS — R7881 Bacteremia: Secondary | ICD-10-CM | POA: Diagnosis not present

## 2022-07-25 ENCOUNTER — Other Ambulatory Visit: Payer: Medicaid Other | Admitting: Obstetrics and Gynecology

## 2022-07-25 DIAGNOSIS — R7881 Bacteremia: Secondary | ICD-10-CM | POA: Diagnosis not present

## 2022-07-25 DIAGNOSIS — Z992 Dependence on renal dialysis: Secondary | ICD-10-CM | POA: Diagnosis not present

## 2022-07-25 DIAGNOSIS — D689 Coagulation defect, unspecified: Secondary | ICD-10-CM | POA: Diagnosis not present

## 2022-07-25 DIAGNOSIS — N186 End stage renal disease: Secondary | ICD-10-CM | POA: Diagnosis not present

## 2022-07-25 NOTE — Patient Instructions (Signed)
Visit Information  [name]  - as a part of your Medicaid benefit, you are eligible for care management and care coordination services at no cost or copay. I was unable to reach you by phone today but would be happy to help you with your health related needs. Please feel free to call me at [phone].  [name] RN, BSN [place]  [facility] - Managed Medicaid High Risk [phone]

## 2022-07-25 NOTE — Patient Outreach (Signed)
Care Coordination  07/25/2022  [name] Mar 04, 1966 [id number]   Medicaid Managed Care   Unsuccessful Outreach Note  07/25/2022 Name: Cody Webster MRN: [id number] DOB: 1965-12-03  Referred by: Kerin Perna, NP Reason for referral : High Risk Managed Medicaid (Unsuccessful telephone outreach)  Fourth  unsuccessful telephone outreach was attempted.  The patient was referred to the case management team for assistance with care management and care coordination. The patient's primary care provider has been notified of our unsuccessful attempts to make or maintain contact with the patient. The care management team is pleased to engage with this patient at any time in the future should he/she be interested in assistance from the care management team.   Follow Up Plan: The patient has been provided with contact information for the care management team and has been advised to call with any health related questions or concerns.  We have been unable to make contact with the patient for follow up. The care management team is available to follow up with the patient after provider conversation with the patient regarding recommendation for care management engagement and subsequent re-referral to the care management team.   [name] RN, BSN [place] Management Coordinator - Managed [place] High Risk [phone]

## 2022-07-27 DIAGNOSIS — D689 Coagulation defect, unspecified: Secondary | ICD-10-CM | POA: Diagnosis not present

## 2022-07-27 DIAGNOSIS — N186 End stage renal disease: Secondary | ICD-10-CM | POA: Diagnosis not present

## 2022-07-27 DIAGNOSIS — R7881 Bacteremia: Secondary | ICD-10-CM | POA: Diagnosis not present

## 2022-07-27 DIAGNOSIS — Z992 Dependence on renal dialysis: Secondary | ICD-10-CM | POA: Diagnosis not present

## 2022-07-29 DIAGNOSIS — D689 Coagulation defect, unspecified: Secondary | ICD-10-CM | POA: Diagnosis not present

## 2022-07-29 DIAGNOSIS — R7881 Bacteremia: Secondary | ICD-10-CM | POA: Diagnosis not present

## 2022-07-29 DIAGNOSIS — Z992 Dependence on renal dialysis: Secondary | ICD-10-CM | POA: Diagnosis not present

## 2022-07-29 DIAGNOSIS — N186 End stage renal disease: Secondary | ICD-10-CM | POA: Diagnosis not present

## 2022-08-01 DIAGNOSIS — R7881 Bacteremia: Secondary | ICD-10-CM | POA: Diagnosis not present

## 2022-08-01 DIAGNOSIS — D689 Coagulation defect, unspecified: Secondary | ICD-10-CM | POA: Diagnosis not present

## 2022-08-01 DIAGNOSIS — N186 End stage renal disease: Secondary | ICD-10-CM | POA: Diagnosis not present

## 2022-08-01 DIAGNOSIS — Z992 Dependence on renal dialysis: Secondary | ICD-10-CM | POA: Diagnosis not present

## 2022-08-03 DIAGNOSIS — D689 Coagulation defect, unspecified: Secondary | ICD-10-CM | POA: Diagnosis not present

## 2022-08-03 DIAGNOSIS — Z992 Dependence on renal dialysis: Secondary | ICD-10-CM | POA: Diagnosis not present

## 2022-08-03 DIAGNOSIS — N186 End stage renal disease: Secondary | ICD-10-CM | POA: Diagnosis not present

## 2022-08-03 DIAGNOSIS — R7881 Bacteremia: Secondary | ICD-10-CM | POA: Diagnosis not present

## 2022-08-06 DIAGNOSIS — D689 Coagulation defect, unspecified: Secondary | ICD-10-CM | POA: Diagnosis not present

## 2022-08-06 DIAGNOSIS — N186 End stage renal disease: Secondary | ICD-10-CM | POA: Diagnosis not present

## 2022-08-06 DIAGNOSIS — R7881 Bacteremia: Secondary | ICD-10-CM | POA: Diagnosis not present

## 2022-08-06 DIAGNOSIS — Z992 Dependence on renal dialysis: Secondary | ICD-10-CM | POA: Diagnosis not present

## 2022-08-08 DIAGNOSIS — Z992 Dependence on renal dialysis: Secondary | ICD-10-CM | POA: Diagnosis not present

## 2022-08-08 DIAGNOSIS — R7881 Bacteremia: Secondary | ICD-10-CM | POA: Diagnosis not present

## 2022-08-08 DIAGNOSIS — N186 End stage renal disease: Secondary | ICD-10-CM | POA: Diagnosis not present

## 2022-08-08 DIAGNOSIS — D689 Coagulation defect, unspecified: Secondary | ICD-10-CM | POA: Diagnosis not present

## 2022-08-10 DIAGNOSIS — R7881 Bacteremia: Secondary | ICD-10-CM | POA: Diagnosis not present

## 2022-08-10 DIAGNOSIS — N186 End stage renal disease: Secondary | ICD-10-CM | POA: Diagnosis not present

## 2022-08-10 DIAGNOSIS — Z992 Dependence on renal dialysis: Secondary | ICD-10-CM | POA: Diagnosis not present

## 2022-08-10 DIAGNOSIS — D689 Coagulation defect, unspecified: Secondary | ICD-10-CM | POA: Diagnosis not present

## 2022-08-12 DIAGNOSIS — N186 End stage renal disease: Secondary | ICD-10-CM | POA: Diagnosis not present

## 2022-08-12 DIAGNOSIS — R7881 Bacteremia: Secondary | ICD-10-CM | POA: Diagnosis not present

## 2022-08-12 DIAGNOSIS — D689 Coagulation defect, unspecified: Secondary | ICD-10-CM | POA: Diagnosis not present

## 2022-08-12 DIAGNOSIS — Z992 Dependence on renal dialysis: Secondary | ICD-10-CM | POA: Diagnosis not present

## 2022-08-14 DIAGNOSIS — I129 Hypertensive chronic kidney disease with stage 1 through stage 4 chronic kidney disease, or unspecified chronic kidney disease: Secondary | ICD-10-CM | POA: Diagnosis not present

## 2022-08-14 DIAGNOSIS — N186 End stage renal disease: Secondary | ICD-10-CM | POA: Diagnosis not present

## 2022-08-14 DIAGNOSIS — Z992 Dependence on renal dialysis: Secondary | ICD-10-CM | POA: Diagnosis not present

## 2022-08-15 DIAGNOSIS — D689 Coagulation defect, unspecified: Secondary | ICD-10-CM | POA: Diagnosis not present

## 2022-08-15 DIAGNOSIS — Z992 Dependence on renal dialysis: Secondary | ICD-10-CM | POA: Diagnosis not present

## 2022-08-15 DIAGNOSIS — R7881 Bacteremia: Secondary | ICD-10-CM | POA: Diagnosis not present

## 2022-08-15 DIAGNOSIS — N186 End stage renal disease: Secondary | ICD-10-CM | POA: Diagnosis not present

## 2022-08-17 ENCOUNTER — Telehealth: Payer: Self-pay

## 2022-08-17 DIAGNOSIS — R7881 Bacteremia: Secondary | ICD-10-CM | POA: Diagnosis not present

## 2022-08-17 DIAGNOSIS — Z992 Dependence on renal dialysis: Secondary | ICD-10-CM | POA: Diagnosis not present

## 2022-08-17 DIAGNOSIS — N186 End stage renal disease: Secondary | ICD-10-CM | POA: Diagnosis not present

## 2022-08-17 DIAGNOSIS — D689 Coagulation defect, unspecified: Secondary | ICD-10-CM | POA: Diagnosis not present

## 2022-08-17 NOTE — Progress Notes (Signed)
Attempted to contact patient to perform medication review and help identify any barriers to medication adherence. Unable to reach patient, left VM requesting a return call to [phone].  Joseph Art, Pharm.D. PGY-2 Ambulatory Care Pharmacy Resident

## 2022-08-19 DIAGNOSIS — D689 Coagulation defect, unspecified: Secondary | ICD-10-CM | POA: Diagnosis not present

## 2022-08-19 DIAGNOSIS — N186 End stage renal disease: Secondary | ICD-10-CM | POA: Diagnosis not present

## 2022-08-19 DIAGNOSIS — Z992 Dependence on renal dialysis: Secondary | ICD-10-CM | POA: Diagnosis not present

## 2022-08-19 DIAGNOSIS — R7881 Bacteremia: Secondary | ICD-10-CM | POA: Diagnosis not present

## 2022-08-22 DIAGNOSIS — Z992 Dependence on renal dialysis: Secondary | ICD-10-CM | POA: Diagnosis not present

## 2022-08-22 DIAGNOSIS — D689 Coagulation defect, unspecified: Secondary | ICD-10-CM | POA: Diagnosis not present

## 2022-08-22 DIAGNOSIS — N186 End stage renal disease: Secondary | ICD-10-CM | POA: Diagnosis not present

## 2022-08-22 DIAGNOSIS — R7881 Bacteremia: Secondary | ICD-10-CM | POA: Diagnosis not present

## 2022-08-24 DIAGNOSIS — D689 Coagulation defect, unspecified: Secondary | ICD-10-CM | POA: Diagnosis not present

## 2022-08-24 DIAGNOSIS — R7881 Bacteremia: Secondary | ICD-10-CM | POA: Diagnosis not present

## 2022-08-24 DIAGNOSIS — N186 End stage renal disease: Secondary | ICD-10-CM | POA: Diagnosis not present

## 2022-08-24 DIAGNOSIS — Z992 Dependence on renal dialysis: Secondary | ICD-10-CM | POA: Diagnosis not present

## 2022-08-26 DIAGNOSIS — R7881 Bacteremia: Secondary | ICD-10-CM | POA: Diagnosis not present

## 2022-08-26 DIAGNOSIS — Z992 Dependence on renal dialysis: Secondary | ICD-10-CM | POA: Diagnosis not present

## 2022-08-26 DIAGNOSIS — D689 Coagulation defect, unspecified: Secondary | ICD-10-CM | POA: Diagnosis not present

## 2022-08-26 DIAGNOSIS — N186 End stage renal disease: Secondary | ICD-10-CM | POA: Diagnosis not present

## 2022-08-29 DIAGNOSIS — R7881 Bacteremia: Secondary | ICD-10-CM | POA: Diagnosis not present

## 2022-08-29 DIAGNOSIS — D689 Coagulation defect, unspecified: Secondary | ICD-10-CM | POA: Diagnosis not present

## 2022-08-29 DIAGNOSIS — N186 End stage renal disease: Secondary | ICD-10-CM | POA: Diagnosis not present

## 2022-08-29 DIAGNOSIS — Z23 Encounter for immunization: Secondary | ICD-10-CM | POA: Diagnosis not present

## 2022-08-29 DIAGNOSIS — Z992 Dependence on renal dialysis: Secondary | ICD-10-CM | POA: Diagnosis not present

## 2022-08-31 DIAGNOSIS — N186 End stage renal disease: Secondary | ICD-10-CM | POA: Diagnosis not present

## 2022-08-31 DIAGNOSIS — D689 Coagulation defect, unspecified: Secondary | ICD-10-CM | POA: Diagnosis not present

## 2022-08-31 DIAGNOSIS — R7881 Bacteremia: Secondary | ICD-10-CM | POA: Diagnosis not present

## 2022-08-31 DIAGNOSIS — Z23 Encounter for immunization: Secondary | ICD-10-CM | POA: Diagnosis not present

## 2022-08-31 DIAGNOSIS — Z992 Dependence on renal dialysis: Secondary | ICD-10-CM | POA: Diagnosis not present

## 2022-09-02 DIAGNOSIS — R7881 Bacteremia: Secondary | ICD-10-CM | POA: Diagnosis not present

## 2022-09-02 DIAGNOSIS — Z23 Encounter for immunization: Secondary | ICD-10-CM | POA: Diagnosis not present

## 2022-09-02 DIAGNOSIS — D689 Coagulation defect, unspecified: Secondary | ICD-10-CM | POA: Diagnosis not present

## 2022-09-02 DIAGNOSIS — N186 End stage renal disease: Secondary | ICD-10-CM | POA: Diagnosis not present

## 2022-09-02 DIAGNOSIS — Z992 Dependence on renal dialysis: Secondary | ICD-10-CM | POA: Diagnosis not present

## 2022-09-05 DIAGNOSIS — N186 End stage renal disease: Secondary | ICD-10-CM | POA: Diagnosis not present

## 2022-09-05 DIAGNOSIS — D689 Coagulation defect, unspecified: Secondary | ICD-10-CM | POA: Diagnosis not present

## 2022-09-05 DIAGNOSIS — R7881 Bacteremia: Secondary | ICD-10-CM | POA: Diagnosis not present

## 2022-09-05 DIAGNOSIS — Z23 Encounter for immunization: Secondary | ICD-10-CM | POA: Diagnosis not present

## 2022-09-05 DIAGNOSIS — Z992 Dependence on renal dialysis: Secondary | ICD-10-CM | POA: Diagnosis not present

## 2022-09-07 DIAGNOSIS — Z992 Dependence on renal dialysis: Secondary | ICD-10-CM | POA: Diagnosis not present

## 2022-09-07 DIAGNOSIS — N186 End stage renal disease: Secondary | ICD-10-CM | POA: Diagnosis not present

## 2022-09-07 DIAGNOSIS — R7881 Bacteremia: Secondary | ICD-10-CM | POA: Diagnosis not present

## 2022-09-07 DIAGNOSIS — D689 Coagulation defect, unspecified: Secondary | ICD-10-CM | POA: Diagnosis not present

## 2022-09-07 DIAGNOSIS — Z23 Encounter for immunization: Secondary | ICD-10-CM | POA: Diagnosis not present

## 2022-09-09 DIAGNOSIS — N186 End stage renal disease: Secondary | ICD-10-CM | POA: Diagnosis not present

## 2022-09-09 DIAGNOSIS — R7881 Bacteremia: Secondary | ICD-10-CM | POA: Diagnosis not present

## 2022-09-09 DIAGNOSIS — Z23 Encounter for immunization: Secondary | ICD-10-CM | POA: Diagnosis not present

## 2022-09-09 DIAGNOSIS — D689 Coagulation defect, unspecified: Secondary | ICD-10-CM | POA: Diagnosis not present

## 2022-09-09 DIAGNOSIS — Z992 Dependence on renal dialysis: Secondary | ICD-10-CM | POA: Diagnosis not present

## 2022-09-12 DIAGNOSIS — D689 Coagulation defect, unspecified: Secondary | ICD-10-CM | POA: Diagnosis not present

## 2022-09-12 DIAGNOSIS — R7881 Bacteremia: Secondary | ICD-10-CM | POA: Diagnosis not present

## 2022-09-12 DIAGNOSIS — N186 End stage renal disease: Secondary | ICD-10-CM | POA: Diagnosis not present

## 2022-09-12 DIAGNOSIS — Z992 Dependence on renal dialysis: Secondary | ICD-10-CM | POA: Diagnosis not present

## 2022-09-13 DIAGNOSIS — I129 Hypertensive chronic kidney disease with stage 1 through stage 4 chronic kidney disease, or unspecified chronic kidney disease: Secondary | ICD-10-CM | POA: Diagnosis not present

## 2022-09-13 DIAGNOSIS — Z992 Dependence on renal dialysis: Secondary | ICD-10-CM | POA: Diagnosis not present

## 2022-09-13 DIAGNOSIS — N186 End stage renal disease: Secondary | ICD-10-CM | POA: Diagnosis not present

## 2022-09-14 DIAGNOSIS — N186 End stage renal disease: Secondary | ICD-10-CM | POA: Diagnosis not present

## 2022-09-14 DIAGNOSIS — R7881 Bacteremia: Secondary | ICD-10-CM | POA: Diagnosis not present

## 2022-09-14 DIAGNOSIS — D689 Coagulation defect, unspecified: Secondary | ICD-10-CM | POA: Diagnosis not present

## 2022-09-14 DIAGNOSIS — Z992 Dependence on renal dialysis: Secondary | ICD-10-CM | POA: Diagnosis not present

## 2022-09-16 DIAGNOSIS — N186 End stage renal disease: Secondary | ICD-10-CM | POA: Diagnosis not present

## 2022-09-16 DIAGNOSIS — D689 Coagulation defect, unspecified: Secondary | ICD-10-CM | POA: Diagnosis not present

## 2022-09-16 DIAGNOSIS — R7881 Bacteremia: Secondary | ICD-10-CM | POA: Diagnosis not present

## 2022-09-16 DIAGNOSIS — Z992 Dependence on renal dialysis: Secondary | ICD-10-CM | POA: Diagnosis not present

## 2022-09-19 DIAGNOSIS — R7881 Bacteremia: Secondary | ICD-10-CM | POA: Diagnosis not present

## 2022-09-19 DIAGNOSIS — D689 Coagulation defect, unspecified: Secondary | ICD-10-CM | POA: Diagnosis not present

## 2022-09-19 DIAGNOSIS — N186 End stage renal disease: Secondary | ICD-10-CM | POA: Diagnosis not present

## 2022-09-19 DIAGNOSIS — Z992 Dependence on renal dialysis: Secondary | ICD-10-CM | POA: Diagnosis not present

## 2022-09-21 DIAGNOSIS — D689 Coagulation defect, unspecified: Secondary | ICD-10-CM | POA: Diagnosis not present

## 2022-09-21 DIAGNOSIS — Z992 Dependence on renal dialysis: Secondary | ICD-10-CM | POA: Diagnosis not present

## 2022-09-21 DIAGNOSIS — N186 End stage renal disease: Secondary | ICD-10-CM | POA: Diagnosis not present

## 2022-09-21 DIAGNOSIS — R7881 Bacteremia: Secondary | ICD-10-CM | POA: Diagnosis not present

## 2022-09-23 DIAGNOSIS — D689 Coagulation defect, unspecified: Secondary | ICD-10-CM | POA: Diagnosis not present

## 2022-09-23 DIAGNOSIS — R7881 Bacteremia: Secondary | ICD-10-CM | POA: Diagnosis not present

## 2022-09-23 DIAGNOSIS — Z992 Dependence on renal dialysis: Secondary | ICD-10-CM | POA: Diagnosis not present

## 2022-09-23 DIAGNOSIS — N186 End stage renal disease: Secondary | ICD-10-CM | POA: Diagnosis not present

## 2022-09-26 ENCOUNTER — Emergency Department (HOSPITAL_COMMUNITY): Payer: Medicaid Other

## 2022-09-26 ENCOUNTER — Observation Stay (HOSPITAL_COMMUNITY)
Admission: EM | Admit: 2022-09-26 | Discharge: 2022-09-28 | Discharge status: Against Medical Advice | Payer: Medicaid Other | Attending: Family Medicine | Admitting: Family Medicine

## 2022-09-26 ENCOUNTER — Encounter (HOSPITAL_COMMUNITY): Payer: Self-pay | Admitting: Family Medicine

## 2022-09-26 DIAGNOSIS — I499 Cardiac arrhythmia, unspecified: Secondary | ICD-10-CM | POA: Diagnosis not present

## 2022-09-26 DIAGNOSIS — I10 Essential (primary) hypertension: Secondary | ICD-10-CM | POA: Diagnosis not present

## 2022-09-26 DIAGNOSIS — I5042 Chronic combined systolic (congestive) and diastolic (congestive) heart failure: Secondary | ICD-10-CM | POA: Diagnosis not present

## 2022-09-26 DIAGNOSIS — Z720 Tobacco use: Secondary | ICD-10-CM | POA: Diagnosis not present

## 2022-09-26 DIAGNOSIS — R0902 Hypoxemia: Secondary | ICD-10-CM | POA: Diagnosis not present

## 2022-09-26 DIAGNOSIS — E7800 Pure hypercholesterolemia, unspecified: Secondary | ICD-10-CM

## 2022-09-26 DIAGNOSIS — Z992 Dependence on renal dialysis: Secondary | ICD-10-CM | POA: Insufficient documentation

## 2022-09-26 DIAGNOSIS — N186 End stage renal disease: Secondary | ICD-10-CM | POA: Diagnosis not present

## 2022-09-26 DIAGNOSIS — I779 Disorder of arteries and arterioles, unspecified: Secondary | ICD-10-CM | POA: Insufficient documentation

## 2022-09-26 DIAGNOSIS — I214 Non-ST elevation (NSTEMI) myocardial infarction: Secondary | ICD-10-CM | POA: Diagnosis not present

## 2022-09-26 DIAGNOSIS — Z79899 Other long term (current) drug therapy: Secondary | ICD-10-CM | POA: Insufficient documentation

## 2022-09-26 DIAGNOSIS — Z227 Latent tuberculosis: Secondary | ICD-10-CM | POA: Diagnosis present

## 2022-09-26 DIAGNOSIS — I132 Hypertensive heart and chronic kidney disease with heart failure and with stage 5 chronic kidney disease, or end stage renal disease: Secondary | ICD-10-CM | POA: Insufficient documentation

## 2022-09-26 DIAGNOSIS — R9431 Abnormal electrocardiogram [ECG] [EKG]: Secondary | ICD-10-CM | POA: Diagnosis not present

## 2022-09-26 DIAGNOSIS — E1122 Type 2 diabetes mellitus with diabetic chronic kidney disease: Secondary | ICD-10-CM | POA: Insufficient documentation

## 2022-09-26 DIAGNOSIS — Z7982 Long term (current) use of aspirin: Secondary | ICD-10-CM | POA: Insufficient documentation

## 2022-09-26 DIAGNOSIS — G4733 Obstructive sleep apnea (adult) (pediatric): Secondary | ICD-10-CM | POA: Diagnosis present

## 2022-09-26 DIAGNOSIS — I959 Hypotension, unspecified: Secondary | ICD-10-CM | POA: Diagnosis not present

## 2022-09-26 DIAGNOSIS — I200 Unstable angina: Secondary | ICD-10-CM | POA: Diagnosis not present

## 2022-09-26 DIAGNOSIS — Z8673 Personal history of transient ischemic attack (TIA), and cerebral infarction without residual deficits: Secondary | ICD-10-CM | POA: Diagnosis not present

## 2022-09-26 DIAGNOSIS — R079 Chest pain, unspecified: Secondary | ICD-10-CM | POA: Diagnosis not present

## 2022-09-26 DIAGNOSIS — F17210 Nicotine dependence, cigarettes, uncomplicated: Secondary | ICD-10-CM | POA: Insufficient documentation

## 2022-09-26 DIAGNOSIS — R0789 Other chest pain: Secondary | ICD-10-CM | POA: Diagnosis not present

## 2022-09-26 HISTORY — DX: Hypertensive heart disease without heart failure: I11.9

## 2022-09-26 HISTORY — DX: Ischemic cardiomyopathy: I25.5

## 2022-09-26 HISTORY — DX: Type 2 diabetes mellitus without complications: E11.9

## 2022-09-26 HISTORY — DX: Latent tuberculosis: Z22.7

## 2022-09-26 HISTORY — DX: Chronic combined systolic (congestive) and diastolic (congestive) heart failure: I50.42

## 2022-09-26 HISTORY — DX: End stage renal disease: N18.6

## 2022-09-26 HISTORY — DX: Tobacco use: Z72.0

## 2022-09-26 LAB — CBC
HCT: 37 % — ABNORMAL LOW (ref 39.0–52.0)
HCT: 30.6 % — ABNORMAL LOW (ref 39.0–52.0)
Hemoglobin: 11.5 g/dL — ABNORMAL LOW (ref 13.0–17.0)
Hemoglobin: 9.7 g/dL — ABNORMAL LOW (ref 13.0–17.0)
MCH: 31.6 pg (ref 26.0–34.0)
MCH: 32.1 pg (ref 26.0–34.0)
MCHC: 31.1 g/dL (ref 30.0–36.0)
MCHC: 31.7 g/dL (ref 30.0–36.0)
MCV: 101.6 fL — ABNORMAL HIGH (ref 80.0–100.0)
MCV: 101.3 fL — ABNORMAL HIGH (ref 80.0–100.0)
Platelets: 144 10*3/uL — ABNORMAL LOW (ref 150–400)
Platelets: 149 10*3/uL — ABNORMAL LOW (ref 150–400)
RBC: 3.64 MIL/uL — ABNORMAL LOW (ref 4.22–5.81)
RBC: 3.02 MIL/uL — ABNORMAL LOW (ref 4.22–5.81)
RDW: 14.2 % (ref 11.5–15.5)
RDW: 14.2 % (ref 11.5–15.5)
WBC: 6.9 10*3/uL (ref 4.0–10.5)
WBC: 7.3 10*3/uL (ref 4.0–10.5)
nRBC: 0 % (ref 0.0–0.2)
nRBC: 0 % (ref 0.0–0.2)

## 2022-09-26 LAB — COMPREHENSIVE METABOLIC PANEL
ALT: 11 U/L (ref 0–44)
AST: 10 U/L — ABNORMAL LOW (ref 15–41)
Albumin: 3.1 g/dL — ABNORMAL LOW (ref 3.5–5.0)
Alkaline Phosphatase: 178 U/L — ABNORMAL HIGH (ref 38–126)
Anion gap: 17 — ABNORMAL HIGH (ref 5–15)
BUN: 52 mg/dL — ABNORMAL HIGH (ref 6–20)
CO2: 22 mmol/L (ref 22–32)
Calcium: 8.7 mg/dL — ABNORMAL LOW (ref 8.9–10.3)
Chloride: 98 mmol/L (ref 98–111)
Creatinine, Ser: 13.27 mg/dL — ABNORMAL HIGH (ref 0.61–1.24)
GFR, Estimated: 4 mL/min — ABNORMAL LOW (ref >60)
Glucose, Bld: 155 mg/dL — ABNORMAL HIGH (ref 70–99)
Potassium: 4.5 mmol/L (ref 3.5–5.1)
Sodium: 137 mmol/L (ref 135–145)
Total Bilirubin: 0.6 mg/dL (ref 0.3–1.2)
Total Protein: 7.4 g/dL (ref 6.5–8.1)

## 2022-09-26 LAB — TROPONIN I (HIGH SENSITIVITY)
Troponin I (High Sensitivity): 31 ng/L — ABNORMAL HIGH (ref <18)
Troponin I (High Sensitivity): 46 ng/L — ABNORMAL HIGH (ref <18)
Troponin I (High Sensitivity): 636 ng/L (ref <18)

## 2022-09-26 LAB — CREATININE, SERUM
Creatinine, Ser: 14.13 mg/dL — ABNORMAL HIGH (ref 0.61–1.24)
GFR, Estimated: 4 mL/min — ABNORMAL LOW (ref >60)

## 2022-09-26 LAB — MRSA NEXT GEN BY PCR, NASAL: MRSA by PCR Next Gen: NOT DETECTED

## 2022-09-26 MED ORDER — ASPIRIN 81 MG PO TBEC
81.0000 mg | DELAYED_RELEASE_TABLET | Freq: Every evening | ORAL | Status: DC
  Administered 2022-09-26: 81 mg via ORAL
  Filled 2022-09-26: qty 1

## 2022-09-26 MED ORDER — ATORVASTATIN CALCIUM 40 MG PO TABS
40.0000 mg | ORAL_TABLET | Freq: Every day | ORAL | Status: DC
  Administered 2022-09-26 – 2022-09-27 (×2): 40 mg via ORAL
  Filled 2022-09-26 (×2): qty 1

## 2022-09-26 MED ORDER — CLONIDINE HCL 0.1 MG PO TABS
0.1000 mg | ORAL_TABLET | Freq: Three times a day (TID) | ORAL | Status: DC
  Administered 2022-09-26 – 2022-09-27 (×4): 0.1 mg via ORAL
  Filled 2022-09-26 (×4): qty 1

## 2022-09-26 MED ORDER — ACETAMINOPHEN 325 MG PO TABS
650.0000 mg | ORAL_TABLET | ORAL | Status: DC | PRN
  Administered 2022-09-26: 650 mg via ORAL
  Filled 2022-09-26: qty 2

## 2022-09-26 MED ORDER — HEPARIN BOLUS VIA INFUSION
4000.0000 [IU] | Freq: Once | INTRAVENOUS | Status: AC
  Administered 2022-09-26: 4000 [IU] via INTRAVENOUS
  Filled 2022-09-26: qty 4000

## 2022-09-26 MED ORDER — ONDANSETRON HCL 4 MG/2ML IJ SOLN: 4.0000 mg | Freq: Four times a day (QID) | INTRAMUSCULAR | Status: DC | PRN

## 2022-09-26 MED ORDER — HEPARIN SODIUM (PORCINE) 5000 UNIT/ML IJ SOLN: 5000.0000 [IU] | Freq: Three times a day (TID) | INTRAMUSCULAR | Status: DC

## 2022-09-26 MED ORDER — HEPARIN (PORCINE) 25000 UT/250ML-% IV SOLN
1100.0000 [IU]/h | INTRAVENOUS | Status: DC
  Administered 2022-09-26: 950 [IU]/h via INTRAVENOUS
  Administered 2022-09-27: 1100 [IU]/h via INTRAVENOUS
  Filled 2022-09-26 (×2): qty 250

## 2022-09-26 MED ORDER — CARVEDILOL 25 MG PO TABS
25.0000 mg | ORAL_TABLET | Freq: Two times a day (BID) | ORAL | Status: DC
  Administered 2022-09-26 – 2022-09-27 (×3): 25 mg via ORAL
  Filled 2022-09-26: qty 2
  Filled 2022-09-26 (×2): qty 1

## 2022-09-26 MED ORDER — NITROGLYCERIN 0.4 MG SL SUBL
0.4000 mg | SUBLINGUAL_TABLET | SUBLINGUAL | Status: DC | PRN
  Administered 2022-09-26: 0.4 mg via SUBLINGUAL

## 2022-09-26 NOTE — ED Notes (Addendum)
Transfer of care report given to Paden, RN. Pt well appearing upon transport.

## 2022-09-26 NOTE — ED Provider Notes (Signed)
[place] EMERGENCY DEPARTMENT AT [facility] Provider Note   CSN: [id number] Arrival date & time: 09/26/22  1034     History  Chief Complaint  Patient presents with   Chest Pain   Shortness of Breath    Cody Webster is a 57 y.o. male with ESRD on HD Monday Wednesday Friday, unstable angina, T2DM, HTN, OSA, anemia, latent TB, CHF, history of CVA presents with acute onset left sided central chest pain that started when he was getting dressed this morning on his way to go to dialysis.  Rated 9 out of 10.  Never had this pain before.  No associated nausea vomiting diaphoresis.  It is worse with exertion.  Pain does not radiate anywhere.  No history of MI.  Patient took 324 mg of aspirin and EMS gave him 1 tablet of sublingual nitroglycerin which helped his pain some.  His last dialysis session was on Friday, he has not missed any sessions but he did not receive a full session on Friday due to feeling like his PTSD symptoms were flaring and needing to go home early.  Per chart review patient had last echo on 05/05/2022 which showed LVEF 45 to 50%, some regional wall motion abnormalities of the left ventricle with mild LVH and G2 DD, mildly enlarged right ventricle and moderately elevated pulmonary artery pressure. Per discharge summary from admission in December 2023 during which patient had trope bump: "Cardiology on board and initially plans were to do cardiac catheterization during hospitalization however due to him having a stroke, cardiology decided to manage medically and they will likely plan outpatient cardiac cath.  He needs to follow-up with them."  Patient has not had a cardiac cath as an outpatient.   Chest Pain Associated symptoms: shortness of breath   Shortness of Breath Associated symptoms: chest pain        Home Medications Prior to Admission medications   Medication Sig Start Date End Date Taking? Authorizing Provider  acetaminophen (TYLENOL) 500 MG tablet  Take 1,000 mg by mouth every 6 (six) hours as needed (for pain/headaches.). Patient not taking: Reported on 05/27/2022    [provider]  aspirin EC 81 MG tablet Take 81 mg by mouth every evening.    [provider]  atorvastatin (LIPITOR) 40 MG tablet Take 1 tablet (40 mg total) by mouth daily. Patient not taking: Reported on 05/27/2022 05/12/22 06/11/22  Hughie Closs, MD  carvedilol (COREG) 25 MG tablet Take 1 tablet (25 mg total) by mouth 2 (two) times daily with a meal. 05/12/22 06/11/22  Hughie Closs, MD  cetirizine (ZYRTEC) 10 MG tablet TAKE 1 TABLET (10 MG TOTAL) BY MOUTH DAILY. 07/04/22   Grayce Sessions, NP  cloNIDine (CATAPRES) 0.1 MG tablet TAKE ONE (1) TABLET BY MOUTH THREE TIMES DAILY. 07/04/22   Grayce Sessions, NP  fluticasone (FLONASE) 50 MCG/ACT nasal spray Place 2 sprays into both nostrils daily. Patient not taking: Reported on 05/27/2022 08/25/20   Grayce Sessions, NP  gabapentin (NEURONTIN) 300 MG capsule Take 1 capsule (300 mg total) by mouth 2 (two) times daily. Patient not taking: Reported on 05/27/2022 04/21/22   Nadara Mustard, MD  guaiFENesin (MUCINEX) 600 MG 12 hr tablet Take 1 tablet (600 mg total) by mouth 2 (two) times daily as needed for to loosen phlegm. Patient not taking: Reported on 05/27/2022 10/01/20   Grayce Sessions, NP  hydrALAZINE (APRESOLINE) 50 MG tablet Take 2 tablets (100 mg total) by mouth 3 (  three) times daily. Patient not taking: Reported on 05/27/2022 05/12/22   Hughie Closs, MD  isoniazid (NYDRAZID) 300 MG tablet Take 1 tablet (300 mg total) by mouth daily. Patient not taking: Reported on 05/27/2022 05/12/22   Hughie Closs, MD  isosorbide mononitrate (IMDUR) 60 MG 24 hr tablet Take 1 tablet (60 mg total) by mouth daily. Patient not taking: Reported on 05/27/2022 05/12/22 06/11/22  Hughie Closs, MD  mirtazapine (REMERON) 7.5 MG tablet Take 1 tablet (7.5 mg total) by mouth at bedtime. Patient not taking: Reported on  05/27/2022 05/12/22 06/11/22  Hughie Closs, MD  nitroGLYCERIN (NITROSTAT) 0.4 MG SL tablet Place 1 tablet (0.4 mg total) under the tongue every 5 (five) minutes as needed for chest pain (CP or SOB). Patient not taking: Reported on 05/27/2022 07/22/17   Albertine Grates, MD  pyridOXINE (VITAMIN B6) 50 MG tablet Take 1 tablet (50 mg total) by mouth daily. Patient not taking: Reported on 05/04/2022 03/29/22   Gardiner Barefoot, MD  traZODone (DESYREL) 100 MG tablet TAKE 1/2 TO 1 TABLET BY MOUTH AT BEDTIME AS NEEDED FOR SLEEP. 06/04/22   Grayce Sessions, NP      Allergies    Morphine and related    Review of Systems   Review of Systems  Respiratory:  Positive for shortness of breath.   Cardiovascular:  Positive for chest pain.   Review of systems Negative for f/c, SOB, cough.  A 10 point review of systems was performed and is negative unless otherwise reported in HPI.  Physical Exam Updated Vital Signs BP (!) 162/74   Pulse 86   Temp 97.8 F (36.6 C) (Oral)   Resp 13   Ht 6\' 1"  (1.854 m)   Wt 79.4 kg   SpO2 96%   BMI 23.09 kg/m  Physical Exam General: Uncomfortable appearing male, lying in bed.  HEENT: PERRLA, Sclera anicteric, MMM, trachea midline.  Cardiology: RRR, no murmurs/rubs/gallops. BL radial and DP pulses equal bilaterally.  Dialysis port in right upper chest without any surrounding erythema induration or tenderness palpation. Resp: Normal respiratory rate and effort. CTAB, no wheezes, rhonchi, crackles.  Abd: Soft, non-tender, non-distended. No rebound tenderness or guarding.  GU: Deferred. MSK: No peripheral edema or signs of trauma. Extremities without deformity or TTP. No cyanosis or clubbing. Skin: warm, dry.  Neuro: A&Ox4, CNs II-XII grossly intact. MAEs. Sensation grossly intact.  Psych: Normal mood and affect.   ED Results / Procedures / Treatments   Labs (all labs ordered are listed, but only abnormal results are displayed) Labs Reviewed  CBC - Abnormal; Notable for  the following components:      Result Value   RBC 3.64 (*)    Hemoglobin 11.5 (*)    HCT 37.0 (*)    MCV 101.6 (*)    Platelets 144 (*)    All other components within normal limits  COMPREHENSIVE METABOLIC PANEL - Abnormal; Notable for the following components:   Glucose, Bld 155 (*)    BUN 52 (*)    Creatinine, Ser 13.27 (*)    Calcium 8.7 (*)    Albumin 3.1 (*)    AST 10 (*)    Alkaline Phosphatase 178 (*)    GFR, Estimated 4 (*)    Anion gap 17 (*)    All other components within normal limits  TROPONIN I (HIGH SENSITIVITY) - Abnormal; Notable for the following components:   Troponin I (High Sensitivity) 31 (*)    All other components within normal limits  TROPONIN I (HIGH SENSITIVITY) - Abnormal; Notable for the following components:   Troponin I (High Sensitivity) 46 (*)    All other components within normal limits  D-DIMER, QUANTITATIVE (NOT AT [facility])    EKG EKG Interpretation  Date/Time:  Monday Sep 26 2022 10:51:41 EDT Ventricular Rate:  90 PR Interval:  183 QRS Duration: 96 QT Interval:  388 QTC Calculation: 475 R Axis:   81 Text Interpretation: Sinus rhythm  Anterior ST elevation c/w prior EKGs Confirmed by Vivi Barrack [phone]) on 09/26/2022 11:00:46 AM  Radiology DG Chest Port 1 View  Result Date: 09/26/2022 CLINICAL DATA:  Chest pain and shortness of breath this morning. Hemodialysis patient. EXAM: PORTABLE CHEST 1 VIEW COMPARISON:  Radiographs 05/07/2022 and 05/04/2022. FINDINGS: 1128 hours. Lordotic positioning. Right IJ hemodialysis catheter projects to the lower SVC level. The heart size and mediastinal contours are stable. The lungs remain clear. There is no pleural effusion pneumothorax. No acute osseous findings are evident. Telemetry leads overlie the chest. IMPRESSION: No evidence of acute cardiopulmonary process. Right IJ hemodialysis catheter tip projects to the lower SVC level. Electronically Signed   By: Carey Bullocks M.D.   On: 09/26/2022 11:45     Procedures Procedures    Medications Ordered in ED Medications  nitroGLYCERIN (NITROSTAT) SL tablet 0.4 mg (0.4 mg Sublingual Given 09/26/22 1115)    ED Course/ Medical Decision Making/ A&P                          Medical Decision Making Amount and/or Complexity of Data Reviewed Labs: ordered. Decision-making details documented in ED Course. Radiology: ordered. Decision-making details documented in ED Course.  Risk Prescription drug management. Decision regarding hospitalization.    This patient presents to the ED for concern of chest pain, this involves an extensive number of treatment options, and is a complaint that carries with it a high risk of complications and morbidity.  I considered the following differential and admission for this acute, potentially life threatening condition.   MDM:    DDX for chest pain includes but is not limited to:  High concern for ACS given patient's history of CAD as well as unstable angina and pain that was exertional and partially relieved by nitroglycerin tablet given by EMS earlier.  EKG on arrival does not demonstrate any new ST elevations or depressions.  Will obtain troponin and give more nitroglycerin for pain control.  Also considered aortic dissection but overall lower concern given presenting sx, no radiation of pain, intact peripheral pulses. Patient cannot PERC out based on age, but minimal risk factors for PE, no signs or symptoms of DVT on exam, will obtain a dimer. No c/f dissection. No abdominal pain and no c/f biliary disease. Consider electrolyte derangements given that patient did not receive a full session of dialysis on Friday, also consider volume overload though he does not complain of any shortness of breath and has no crackles on exam..    Clinical Course as of 10/08/22 1621  Mon Sep 26, 2022  1140 WBC: 6.9 [HN]  1141 Hemoglobin(!): 11.5 Increased from BL 8 three months ago [HN]  1208 DG Chest Port 1 View No  evidence of acute cardiopulmonary process. Right IJ hemodialysis catheter tip projects to the lower SVC level.   [HN]  1247 Troponin I (High Sensitivity)(!): 31 [HN]  1247 Creatinine(!): 13.27 Dialysis patient [HN]  1247 Anion gap(!): 17 [HN]  1436 Patient reevaluated, he states he doesn't have any  pain, thinks the nitroglycerin took it away. Will consult to cardiology.  [HN]  1609 Repaging cardiology [HN]  1613 D/w cards who will consult, likely will cath, recommends hospitalist admisison. [HN]    Clinical Course User Index [HN] Loetta Rough, MD    Labs: I Ordered, and personally interpreted labs.  The pertinent results include: Those listed above  Imaging Studies ordered: I ordered imaging studies including chest x-ray I independently visualized and interpreted imaging. I agree with the radiologist interpretation  Additional history obtained from chart review.   Cardiac Monitoring: The patient was maintained on a cardiac monitor.  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   EF now improved back to normal mildly reduced   Peripheral neuropathy    Pneumonia    PTSD (post-traumatic stress disorder)    Seizures (HCC)    27 years ago   Stroke (HCC) 09/2021    Tuberculosis      Medicines Meds ordered this encounter  Medications   nitroGLYCERIN (NITROSTAT) SL tablet 0.4 mg    I have reviewed the patients home medicines and have made adjustments as needed  Problem List / ED Course: Problem List Items Addressed This Visit   None Visit Diagnoses     Chest pain, unspecified type    -  Primary                   This note was created using dictation software, which may contain spelling or grammatical errors.    Loetta Rough, MD 10/08/22 [phone]

## 2022-09-26 NOTE — ED Notes (Addendum)
This RN and phlebotomy attempted to obtain labs unsuccessfully. Existing PIV doe snot draw back. Provider aware.

## 2022-09-26 NOTE — Consult Note (Signed)
Cardiology Consult    Patient ID: Masato Zunk MRN: [id number], DOB/AGE: 59/25/1967   Admit date: 09/26/2022 Date of Consult: 09/26/2022  Primary Physician: Grayce Sessions, NP Primary Cardiologist: Bryan Lemma, MD Requesting Provider: Estill Cotta, MD  Patient Profile    Duwan Blaney is a 57 y.o. male with a history of ESRD on HD (04/2022), HTN, HL, DMII, presumed ischemic cardiomyopathy, chronic combined syst/diast CHF, lacunar stroke (04/2022), ongoing tobacco abuse, and PTSD, who is being seen today for the evaluation of chest pain and elevated troponin at the request of Dr. Jacqulyn Bath.  Past Medical History   Past Medical History:  Diagnosis Date   Anemia    Carotid artery occlusion    Chronic combined systolic (congestive) and diastolic (congestive) heart failure (HCC)    a. 06/2014 Echo: EF 50%; b. 07/2017 Echo: EF 55-60%; c. 07/2020 Echo: EF 50-55%; d. 04/2022 Echo: EF 45-50%, basal-mid anterolateral and basal to mid inf HK. Mild conc LVH, GrII DD. NL RV fxn. RVSP 51.64mmHg. Sev dil LA. Mildly dil RA. MIld-mod MR.   Depression    ESRD (end stage renal disease) (HCC)    a. 04/2022 - initiated HD-->MWF   GERD (gastroesophageal reflux disease)    Hypertensive heart disease    Ischemic cardiomyopathy    a. 06/2014 Echo: EF 50%; b. 07/2017 Echo: EF 55-60%; d. 04/2018 MV: EF 47%, moderate inf ischemia; e. 04/2022 Echo: EF 45-50%.   Latent tuberculosis    Peripheral neuropathy    Pneumonia    PTSD (post-traumatic stress disorder)    Seizures (HCC)    27 years ago   Stroke [facility])    a. 04/2022 MRI Brain: small acute infarct - R posterior pons and subcortical L cerebral hemisphere. Chronic sm vessel dzs.   Tobacco abuse    Type II diabetes mellitus (HCC)    type 2    Past Surgical History:  Procedure Laterality Date   AV FISTULA PLACEMENT Left 11/08/2016   Procedure: ARTERIOVENOUS (AV) FISTULA CREATION;  Surgeon: Chuck Hint, MD;  Location: [facility] OR;  Service:  Vascular;  Laterality: Left;   AV FISTULA PLACEMENT Left 03/23/2020   Procedure: LEFT BASILIC VEIN FISTULA CREATION;  Surgeon: Chuck Hint, MD;  Location: [facility] OR;  Service: Vascular;  Laterality: Left;   IR FLUORO GUIDE CV LINE RIGHT  05/10/2022   IR US GUIDE VASC ACCESS RIGHT  05/10/2022   NM MYOVIEW LTD  07/2017   "Moderate sized moderate severity" (on cardiology was very small size, small intensity) partially reversible inferoapical//inferoseptal defect.  (Read as intermediate-high risk) -> over read by cardiology as LOW RISK   NM MYOVIEW LTD  09/2019   [facility] Small sized, mild severity reversible defect involving apical lateral & mid anterolateral wall thought to be artifacts - but CRO mild ischemia.  EF 55%. => Read as LOW RISK:   right foot surgery     TOE AMPUTATION     TRANSTHORACIC ECHOCARDIOGRAM  07/2017   EF 60%.,  Moderate LVH.  GR 1 DD.  No R WMA.  Mild RV and moderate RA dilation.   TRANSTHORACIC ECHOCARDIOGRAM  10/03/2019   [facility]: EF 40%, mild MR, mild aortic calcification     Allergies  Allergies  Allergen Reactions   Morphine And Related Shortness Of Breath and Other (See Comments)    Hallucination  Tolerates Norco/Vicodin    History of Present Illness    57 year old male with above past medical history including end-stage renal disease on  hemodialysis since December 2013, hypertension, hyperlipidemia, type 2 diabetes mellitus, presumed ischemic cardiomyopathy, chronic mild systolic diastolic congestive heart failure, lacunar stroke (December 2023), ongoing tobacco abuse, and PTSD.  He previous underwent stress testing in 2016, which showed an EF 47% with moderate inferior ischemia.  Echo at that time showed an EF of 50%.  Further ischemic evaluation was deferred in the setting of chronic kidney disease.  Follow-up echo in March 2019 showed an EF of 55 to 60% with echo in March 2022 showing similar at 50-55%.  In December 2022, he was admitted with  chest pain and lacunar stroke.  He was seen by our team in the setting of volume overload and chest pain.  EF by echo was 45 to 50%.  He was started on dialysis during that admission.  Ischemic evaluation was deferred given absence of angina, flat and normal troponins, stroke, and initiation of dialysis.  Patient did not follow-up in the outpatient setting.  Over the past several months, patient notes that he has not tolerated dialysis well.  He notes that it causes him to feel very fatigued and he has diffuse body pains, that seem to be worse on dialysis days.  He does have chronic pain at baseline.  He is not particularly active but at baseline, does not experience chest pain however, he does have dyspnea on exertion ever since December, noting that he can only walk about 10 to 15 yards prior to having to stop and rest.  He was at dialysis last Wednesday when he developed substernal chest heaviness and pressure associated with dyspnea.  Dialysis session was stopped and symptoms resolved within about 10 minutes.  He had to stop his Friday session early due to anxiety and PTSD.  This morning, he was readying for dialysis and while getting dressed, he had recurrent substernal chest heaviness and pressure associated with dyspnea.  He sat and symptoms persisted, prompting him to call EMS.  Upon their arrival, he was treated with sublingual nitroglycerin with some but not complete relief.  He was then taken to the emergency department where he received additional nitroglycerin with eventual complete relief of discomfort.  His ECG showed sinus rhythm at 90 bpm without acute ST or T changes.  Lab work notable for troponin of 31  46, BUN/creatinine 52/13.27, and H&H of 11.5/37.0.  He is currently chest pain-free.  Inpatient Medications     aspirin EC  81 mg Oral QPM   atorvastatin  40 mg Oral Daily   carvedilol  25 mg Oral BID WC   cloNIDine  0.1 mg Oral TID    Family History    Family History  Problem  Relation Age of Onset   CAD Mother    Heart attack Mother    Diabetes type II Father    Stroke Father    CAD Father    Hypertension Sister    CAD Sister    Diabetes type II Brother    Colon cancer Maternal Uncle        2015   Colon polyps Neg Hx    Esophageal cancer Neg Hx    Stomach cancer Neg Hx    Rectal cancer Neg Hx    He indicated that his mother is deceased. He indicated that his father is deceased. He indicated that all of his three sisters are alive. He indicated that all of his four brothers are alive. He indicated that his maternal grandmother is deceased. He indicated that his maternal grandfather is  deceased. He indicated that his paternal grandmother is deceased. He indicated that his paternal grandfather is deceased. He indicated that the status of his maternal uncle is unknown. He indicated that the status of his neg hx is unknown.   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  Does not routinely exercise.   Social Determinants of Health   Financial Resource Strain: Not on file  Food Insecurity: No Food Insecurity (09/26/2022)   Hunger Vital Sign    Worried About Running Out of Food in the Last Year: Never  true    Ran Out of Food in the Last Year: Never true  Transportation Needs: No Transportation Needs (09/26/2022)   PRAPARE - Administrator, Civil Service (Medical): No    Lack of Transportation (Non-Medical): No  Physical Activity: Not on file  Stress: No Stress Concern Present (05/27/2022)   [employer] of Occupational Health - Occupational Stress Questionnaire    Feeling of Stress : Only a little  Social Connections: Not on file  Intimate Partner Violence: Not At Risk (09/26/2022)   Humiliation, Afraid, Rape, and Kick questionnaire    Fear of Current or Ex-Partner: No    Emotionally Abused: No    Physically Abused: No    Sexually Abused: No     Review of Systems    General:  +++ Chronic diffuse body pain.  No chills, fever, night sweats or weight changes.  Cardiovascular:  +++ chest pain, +++ dyspnea on exertion, no edema, orthopnea, palpitations, paroxysmal nocturnal dyspnea. Dermatological: No rash, lesions/masses Respiratory: No cough, +++ chronic dyspnea on exertion Urologic: No hematuria, dysuria Abdominal:   No nausea, vomiting, diarrhea, bright red blood per rectum, melena, or hematemesis Neurologic:  No visual changes, wkns, changes in mental status. All other systems reviewed and are otherwise negative except as noted above.  Physical Exam    Blood pressure (!) 167/72, pulse 87, temperature 98.7 F (37.1 C), temperature source Oral, resp. rate 17, height 6\' 1"  (1.854 m), weight 79.4 kg, SpO2 99 %.  General: Pleasant, NAD Psych: Normal affect. Neuro: Alert and oriented X 3. Moves all extremities spontaneously. HEENT: Normal  Neck: Supple without bruits or JVD. Lungs:  Resp regular and unlabored, diminished breath sounds, faint exp wheezing. Heart: RRR no s3, s4, 2/6 SEM @ the RUSB. Abdomen: Soft, non-tender, non-distended, BS + x 4.  Extremities: No clubbing, cyanosis or edema. DP/PT2+, Radials 2+ and equal bilaterally.  LUE AVF w/ + bruit, weak  thrill.  Labs  Cardiac Enzymes Recent Labs  Lab 09/26/22 1038 09/26/22 1221  TROPONINIHS 31* 46*     BNP    Component Value Date/Time   BNP 2,226.4 (H) 05/04/2022 1308   Lab Results  Component Value Date   WBC 6.9 09/26/2022   HGB 11.5 (L) 09/26/2022   HCT 37.0 (L) 09/26/2022   MCV 101.6 (H) 09/26/2022   PLT 144 (L) 09/26/2022    Recent Labs  Lab 09/26/22 1038  NA 137  K 4.5  CL 98  CO2 22  BUN 52*  CREATININE 13.27*  CALCIUM 8.7*  PROT 7.4  BILITOT 0.6  ALKPHOS 178*  ALT 11  AST 10*  GLUCOSE 155*   Lab Results  Component Value Date   CHOL 156 05/05/2022   HDL 34 (L) 05/05/2022   LDLCALC 107 (H) 05/05/2022   TRIG 76 05/05/2022     Radiology Studies    DG Chest Port 1 View  Result Date: 09/26/2022 CLINICAL DATA:  Chest pain and shortness of breath this morning. Hemodialysis patient. EXAM: PORTABLE CHEST 1 VIEW COMPARISON:  Radiographs 05/07/2022 and 05/04/2022. FINDINGS: 1128 hours. Lordotic positioning. Right IJ hemodialysis catheter projects to the lower SVC level. The heart size and mediastinal contours are stable. The lungs remain clear. There is no pleural effusion pneumothorax. No acute osseous findings are evident. Telemetry leads overlie the chest. IMPRESSION: No evidence of acute cardiopulmonary process. Right IJ hemodialysis catheter tip projects to the lower SVC level. Electronically Signed   By: Carey Bullocks M.D.   On: 09/26/2022 11:45    ECG & Cardiac Imaging    Regular sinus rhythm, 90, no acute ST or T changes- personally reviewed.  Assessment & Plan    1.  Unstable angina: Patient with history of abnormal stress testing dating back to 2016 with moderate inferior ischemia however, ischemic evaluation previously deferred secondary to chronic kidney disease.  During admission in December for chest pain, heart failure, and lacunar stroke, EF noted to be mildly reduced at 45 to 50%.  Again, ischemic evaluation deferred.  He has not followed  up as outpatient and over the past 5 months, has had dyspnea on exertion and now 2 episodes of substernal chest pressure, the most recent occurring this morning while getting ready to go to dialysis.  Symptoms this morning were responsive to nitrates.  Troponins are mildly elevated with a peak of 46 at this time.  Symptoms concerning for unstable angina.  We discussed options for management and agreed to proceed with diagnostic catheterization.  The patient understands that risks include but are not limited to stroke (1 in 1000), death (1 in 1000), kidney failure [usually temporary] (1 in 500), bleeding (1 in 200), allergic reaction [possibly serious] (1 in 200), and agrees to proceed.  Continue aspirin, statin, and beta-blocker therapy.  Will heparinize.  2.  Essential hypertension: Pressures currently elevated.  Patient indicates that he stopped his blood pressure medicines because they "were making him feel bad."  Carvedilol and clonidine resume.  Follow.  3.  End-stage renal disease: Monday Wednesday Friday dialysis per nephrology team.  4.  Hyperlipidemia: Continue statin therapy.  LDL was 107 in December.  Plan to follow-up.  5.  Tobacco abuse: Currently smoking 1 pack/day.  Complete cessation advised.  6.  Chronic combined systolic and diastolic congestive heart failure: EF 45 to 50% by echo in December 2023.  BNP elevated in the setting of markedly elevated creatinine and missing dialysis this morning.  Volume management per nephrology.  Does not appear to be markedly volume overloaded.   7.  PTSD: Per primary team.  Risk Assessment/Risk Scores:     TIMI Risk Score for Unstable Angina or Non-ST Elevation MI:   The patient's TIMI risk score is 2, which indicates a 8% risk of all cause mortality, new or recurrent myocardial infarction or need for urgent revascularization in the next 14 days.      Signed, Nicolasa Ducking, NP 09/26/2022, 7:33 PM  For questions or updates, please contact    Please consult [email or web address] for contact info under Cardiology/STEMI.

## 2022-09-26 NOTE — ED Notes (Signed)
NAD noted, respirations are equal bilaterally and unlabored at this time. Pt resting in gurney and denies any unmet needs. Pt connected to CCM, pulseox & BP. Call light within reach. 

## 2022-09-26 NOTE — ED Notes (Signed)
ED TO INPATIENT HANDOFF REPORT  ED Nurse Name and Phone #: Osvaldo Shipper RN [phone]  S Name/Age/Gender Cody Webster 57 y.o. male Room/Bed: 043C/043C  Code Status   Code Status: DNR  Home/SNF/Other Home Patient oriented to: self, place, time, and situation Is this baseline? Yes   Triage Complete: Triage complete  Chief Complaint Unstable angina (HCC) [I20.0]  Triage Note PT BIB EMS from home for non radiating chest pain, pt took 324 ASA and EMS gave 1 tab nitro. Pt reported 4/10 pain relief from nitro. Dialysis MWF, none missed. A&Ox4. Pt denies SOB at this time.   EMS VS 124/64 90 HR 96% RA BG 127   Allergies Allergies  Allergen Reactions   Morphine And Related Shortness Of Breath and Other (See Comments)    Hallucination  Tolerates Norco/Vicodin    Level of Care/Admitting Diagnosis ED Disposition     ED Disposition  Admit   Condition  --   Comment  Hospital Area: [facility] [[id number]]  Level of Care: Telemetry Medical [104]  May place patient in observation at [facility] or [facility] if equivalent level of care is available:: No  Covid Evaluation: Asymptomatic - no recent exposure (last 10 days) testing not required  Diagnosis: Unstable angina [facility]) [[id number]]  Admitting Physician: Hughie Closs [[id number]]  Attending Physician: Hughie Closs [[id number]]          B Medical/Surgery History 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   EF now improved back to normal mildly reduced   Peripheral neuropathy    Pneumonia    PTSD (post-traumatic stress disorder)    Seizures (HCC)    27 years  ago   Stroke [facility]) 09/2021   Tuberculosis    Past Surgical History:  Procedure Laterality Date   AV FISTULA PLACEMENT Left 11/08/2016   Procedure: ARTERIOVENOUS (AV) FISTULA CREATION;  Surgeon: Chuck Hint, MD;  Location: [facility] OR;  Service: Vascular;  Laterality: Left;   AV FISTULA PLACEMENT Left 03/23/2020   Procedure: LEFT BASILIC VEIN FISTULA CREATION;  Surgeon: Chuck Hint, MD;  Location: [facility] OR;  Service: Vascular;  Laterality: Left;   IR FLUORO GUIDE CV LINE RIGHT  05/10/2022   IR US GUIDE VASC ACCESS RIGHT  05/10/2022   NM MYOVIEW LTD  07/2017   "Moderate sized moderate severity" (on cardiology was very small size, small intensity) partially reversible inferoapical//inferoseptal defect.  (Read as intermediate-high risk) -> over read by cardiology as LOW RISK   NM MYOVIEW LTD  09/2019   [facility] Small sized, mild severity reversible defect involving apical lateral & mid anterolateral wall thought to be artifacts - but CRO mild ischemia.  EF 55%. => Read as LOW RISK:   right foot surgery     TOE AMPUTATION     TRANSTHORACIC ECHOCARDIOGRAM  07/2017   EF 60%.,  Moderate LVH.  GR 1 DD.  No R WMA.  Mild RV and moderate RA dilation.   TRANSTHORACIC ECHOCARDIOGRAM  10/03/2019   [facility]: EF 40%, mild MR, mild aortic calcification  A IV Location/Drains/Wounds Patient Lines/Drains/Airways Status     Active Line/Drains/Airways     Name Placement date Placement time Site Days   Peripheral IV 05/04/22 20 G 2.5" Anterior;Distal;Right;Upper Arm 05/04/22  2030  Arm  145   Peripheral IV 09/26/22 20 G Right Antecubital 09/26/22  1048  Antecubital  less than 1   Peripheral IV 09/26/22 20 G 1.88" Anterior;Right Forearm 09/26/22  1732  Forearm  less than 1   Fistula / Graft Left Upper arm Arteriovenous fistula 11/08/16  1115  Upper arm  2148   Fistula / Graft Left Upper arm Arteriovenous fistula 03/23/20  0839  Upper arm  917   Hemodialysis Catheter Right Internal  jugular Double lumen Permanent (Tunneled) 05/10/22  1308  Internal jugular  139   Wound / Incision (Open or Dehisced) 01/11/15 Diabetic ulcer Foot Left;Posterior;Lateral diabetic ulcer dry and crusty pink open wound non draining 01/11/15  2130  Foot  2815   Wound / Incision (Open or Dehisced) 04/24/16 Diabetic ulcer Foot Left diabetic ulcer to sole of left foot 04/24/16  1122  Foot  2346            Intake/Output Last 24 hours No intake or output data in the 24 hours ending 09/26/22 1734  Labs/Imaging Results for orders placed or performed during the hospital encounter of 09/26/22 (from the past 48 hour(s))  CBC     Status: Abnormal   Collection Time: 09/26/22 10:38 AM  Result Value Ref Range   WBC 6.9 4.0 - 10.5 K/uL   RBC 3.64 (L) 4.22 - 5.81 MIL/uL   Hemoglobin 11.5 (L) 13.0 - 17.0 g/dL   HCT 09.8 (L) 11.9 - 14.7 %   MCV 101.6 (H) 80.0 - 100.0 fL   MCH 31.6 26.0 - 34.0 pg   MCHC 31.1 30.0 - 36.0 g/dL   RDW 82.9 56.2 - 13.0 %   Platelets 144 (L) 150 - 400 K/uL   nRBC 0.0 0.0 - 0.2 %    Comment: Performed at [facility], [address], [place], [place] [address]  Troponin I (High Sensitivity)     Status: Abnormal   Collection Time: 09/26/22 10:38 AM  Result Value Ref Range   Troponin I (High Sensitivity) 31 (H) <18 ng/L    Comment: (NOTE) Elevated high sensitivity troponin I (hsTnI) values and significant  changes across serial measurements may suggest ACS but many other  chronic and acute conditions are known to elevate hsTnI results.  Refer to the "Links" section for chest pain algorithms and additional  guidance. Performed at [facility], [address] [place], [place] [address]   Comprehensive metabolic panel     Status: Abnormal   Collection Time: 09/26/22 10:38 AM  Result Value Ref Range   Sodium 137 135 - 145 mmol/L   Potassium 4.5 3.5 - 5.1 mmol/L   Chloride 98 98 - 111 mmol/L   CO2 22 22 - 32 mmol/L   Glucose, Bld 155 (H) 70 - 99 mg/dL     Comment: Glucose reference range applies only to samples taken after fasting for at least 8 hours.   BUN 52 (H) 6 - 20 mg/dL   Creatinine, Ser 95.28 (H) 0.61 - 1.24 mg/dL   Calcium 8.7 (L) 8.9 - 10.3 mg/dL   Total Protein 7.4 6.5 - 8.1 g/dL   Albumin 3.1 (L) 3.5 - 5.0 g/dL   AST 10 (L) 15 - 41 U/L   ALT 11 0 -  44 U/L   Alkaline Phosphatase 178 (H) 38 - 126 U/L   Total Bilirubin 0.6 0.3 - 1.2 mg/dL   GFR, Estimated 4 (L) >60 mL/min    Comment: (NOTE) Calculated using the CKD-EPI Creatinine Equation (2021)    Anion gap 17 (H) 5 - 15    Comment: Performed at [facility], [address], [place], [place] [address]  Troponin I (High Sensitivity)     Status: Abnormal   Collection Time: 09/26/22 12:21 PM  Result Value Ref Range   Troponin I (High Sensitivity) 46 (H) <18 ng/L    Comment: (NOTE) Elevated high sensitivity troponin I (hsTnI) values and significant  changes across serial measurements may suggest ACS but many other  chronic and acute conditions are known to elevate hsTnI results.  Refer to the "Links" section for chest pain algorithms and additional  guidance. Performed at [facility], [address], [place], [place] [address]    DG Chest Port 1 View  Result Date: 09/26/2022 CLINICAL DATA:  Chest pain and shortness of breath this morning. Hemodialysis patient. EXAM: PORTABLE CHEST 1 VIEW COMPARISON:  Radiographs 05/07/2022 and 05/04/2022. FINDINGS: 1128 hours. Lordotic positioning. Right IJ hemodialysis catheter projects to the lower SVC level. The heart size and mediastinal contours are stable. The lungs remain clear. There is no pleural effusion pneumothorax. No acute osseous findings are evident. Telemetry leads overlie the chest. IMPRESSION: No evidence of acute cardiopulmonary process. Right IJ hemodialysis catheter tip projects to the lower SVC level. Electronically Signed   By: Carey Bullocks M.D.   On: 09/26/2022 11:45    Pending Labs Unresulted Labs  (From admission, onward)     Start     Ordered   09/27/22 0500  Heparin level (unfractionated)  Daily,   R      09/26/22 1632   09/27/22 0500  Lipid panel  Tomorrow morning,   R        09/26/22 1645   09/27/22 0100  Heparin level (unfractionated)  Once-Timed,   URGENT        09/26/22 1632   09/26/22 1645  CBC  (heparin)  Once,   R       Comments: Baseline for heparin therapy IF NOT ALREADY DRAWN.  Notify MD if PLT < 100 K.    09/26/22 1645   09/26/22 1645  Creatinine, serum  (heparin)  Once,   R       Comments: Baseline for heparin therapy IF NOT ALREADY DRAWN.    09/26/22 1645            Vitals/Pain Today's Vitals   09/26/22 1530 09/26/22 1600 09/26/22 1630 09/26/22 1700  BP: (!) 160/79 (!) 146/74 138/77 129/74  Pulse:      Resp: 12 12 12 17   Temp:      TempSrc:      SpO2: 98% 98%    Weight:      Height:      PainSc:  0-No pain      Isolation Precautions No active isolations  Medications Medications  nitroGLYCERIN (NITROSTAT) SL tablet 0.4 mg (0.4 mg Sublingual Given 09/26/22 1115)  heparin bolus via infusion 4,000 Units (has no administration in time range)  heparin ADULT infusion 100 units/mL (25000 units/277mL) (has no administration in time range)  aspirin EC tablet 81 mg (has no administration in time range)  carvedilol (COREG) tablet 25 mg (has no administration in time range)  cloNIDine (CATAPRES) tablet 0.1 mg (has no  administration in time range)  acetaminophen (TYLENOL) tablet 650 mg (has no administration in time range)  ondansetron (ZOFRAN) injection 4 mg (has no administration in time range)  atorvastatin (LIPITOR) tablet 40 mg (has no administration in time range)    Mobility walks     Focused Assessments Cardiac Assessment Handoff:  Cardiac Rhythm: Normal sinus rhythm Lab Results  Component Value Date   CKTOTAL 125 07/22/2017   TROPONINI 0.04 (HH) 07/22/2017   No results found for: "DDIMER" Does the Patient currently have chest pain?  No   , Pulmonary Assessment Handoff:  Lung sounds:   O2 Device: Room Air      R Recommendations: See Admitting Provider Note  Report given to:   Additional Notes: left are restricted. MWF Dialysis

## 2022-09-26 NOTE — Hospital Course (Signed)
31  46 52/13.27 11.5/37.0 

## 2022-09-26 NOTE — H&P (Signed)
History and Physical    Deuntay Friesen ZOX:[id number] DOB: 09/21/1965 DOA: 09/26/2022  PCP: Grayce Sessions, NP  Patient coming from: Home  I have personally briefly reviewed patient's old medical records in [facility]  Chief Complaint: Chest pain  HPI: Cody Webster is a 57 y.o. male with medical history significant of CKD5 which turned out to be ESRD during recent hospitalization few months ago and started on hemodialysis, Monday Wednesday Friday schedule,, HTN, GERD, latent TB presented to ED with a complaint of chest pain.  According to the patient, when he was getting ready to go to dialysis this morning, he started having left-sided chest pain.  The pain was 10 out of 10, sharp and crampy, nonradiating, aggravated with movement, relieved with rest.  He also had associated shortness of breath and dizziness but no diaphoresis or nausea.  He called EMS, and route he was given nitroglycerin which eased up the pain a little bit but by the time he arrived to the ED, his pain worsened again.  He received another nitroglycerin in the ED and pain resolved.  No other complaint.  Of note, during recent hospitalization in December 2023, patient was diagnosed with unstable angina with significantly elevated troponins, echo showed 45% ejection fraction, slightly reduced from before.  Cardiology was planning to do cardiac cath however he had a stroke during that hospitalization so cath was deferred to outpatient.  Patient tells me that he followed up with cardiology 2 months ago and he does not recall any discussion about cath during that office visit.   ED Course: Upon arrival to ED, he was fairly hemodynamically stable.  Chest x-ray unremarkable.  Troponin only slightly elevated at 31> 46.  EDP already consulted cardiology and spoke to Salley Hews so cardiology is consulted.  Hospitalist were called for further admission.  EDP is also going to notify nephrology since patient is due for dialysis  today.  Review of Systems: As per HPI otherwise negative.    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   EF now improved back to normal mildly reduced   Peripheral neuropathy    Pneumonia    PTSD (post-traumatic stress disorder)    Seizures (HCC)    27 years ago   Stroke [facility]) 09/2021   Tuberculosis     Past Surgical History:  Procedure Laterality Date   AV FISTULA PLACEMENT Left 11/08/2016   Procedure: ARTERIOVENOUS (AV) FISTULA CREATION;  Surgeon: Chuck Hint, MD;  Location: [facility] OR;  Service: Vascular;  Laterality: Left;   AV FISTULA PLACEMENT Left 03/23/2020   Procedure: LEFT BASILIC VEIN FISTULA CREATION;  Surgeon: Chuck Hint, MD;  Location: [facility] OR;  Service: Vascular;  Laterality: Left;   IR FLUORO GUIDE CV LINE RIGHT  05/10/2022   IR US GUIDE VASC ACCESS RIGHT  05/10/2022   NM MYOVIEW LTD  07/2017   "Moderate sized moderate severity" (on cardiology was very small size, small intensity) partially reversible inferoapical//inferoseptal defect.  (Read as intermediate-high risk) -> over read by cardiology as LOW RISK   NM MYOVIEW  LTD  09/2019   [facility] Small sized, mild severity reversible defect involving apical lateral & mid anterolateral wall thought to be artifacts - but CRO mild ischemia.  EF 55%. => Read as LOW RISK:   right foot surgery     TOE AMPUTATION     TRANSTHORACIC ECHOCARDIOGRAM  07/2017   EF 60%.,  Moderate LVH.  GR 1 DD.  No R WMA.  Mild RV and moderate RA dilation.   TRANSTHORACIC ECHOCARDIOGRAM  10/03/2019   [facility]: EF 40%, mild MR, mild aortic calcification     reports that he  has been smoking cigarettes. He has a 7.50 pack-year smoking history. He quit smokeless tobacco use about 38 years ago.  His smokeless tobacco use included snuff and chew. He reports that he does not currently use alcohol. He reports that he does not currently use drugs.  Allergies  Allergen Reactions   Morphine And Related Shortness Of Breath and Other (See Comments)    Hallucination  Tolerates Norco/Vicodin    Family History  Problem Relation Age of Onset   CAD Mother    Heart attack Mother    Diabetes type II Father    Stroke Father    CAD Father    Hypertension Sister    CAD Sister    Diabetes type II Brother    Colon cancer Maternal Uncle        2015   Colon polyps Neg Hx    Esophageal cancer Neg Hx    Stomach cancer Neg Hx    Rectal cancer Neg Hx     Prior to Admission medications   Medication Sig Start Date End Date Taking? Authorizing Provider  acetaminophen (TYLENOL) 500 MG tablet Take 1,000 mg by mouth every 6 (six) hours as needed (for pain/headaches.). Patient not taking: Reported on 05/27/2022    [provider]  aspirin EC 81 MG tablet Take 81 mg by mouth every evening.    [provider]  atorvastatin (LIPITOR) 40 MG tablet Take 1 tablet (40 mg total) by mouth daily. Patient not taking: Reported on 05/27/2022 05/12/22 06/11/22  Hughie Closs, MD  carvedilol (COREG) 25 MG tablet Take 1 tablet (25 mg total) by mouth 2 (two) times daily with a meal. 05/12/22 06/11/22  Hughie Closs, MD  cetirizine (ZYRTEC) 10 MG tablet TAKE 1 TABLET (10 MG TOTAL) BY MOUTH DAILY. 07/04/22   Grayce Sessions, NP  cloNIDine (CATAPRES) 0.1 MG tablet TAKE ONE (1) TABLET BY MOUTH THREE TIMES DAILY. 07/04/22   Grayce Sessions, NP  fluticasone (FLONASE) 50 MCG/ACT nasal spray Place 2 sprays into both nostrils daily. Patient not taking: Reported on 05/27/2022 08/25/20   Grayce Sessions, NP  gabapentin (NEURONTIN) 300 MG capsule Take 1 capsule (300 mg total) by mouth 2  (two) times daily. Patient not taking: Reported on 05/27/2022 04/21/22   Nadara Mustard, MD  guaiFENesin (MUCINEX) 600 MG 12 hr tablet Take 1 tablet (600 mg total) by mouth 2 (two) times daily as needed for to loosen phlegm. Patient not taking: Reported on 05/27/2022 10/01/20   Grayce Sessions, NP  hydrALAZINE (APRESOLINE) 50 MG tablet Take 2 tablets (100 mg total) by mouth 3 (three) times daily. Patient not taking: Reported on 05/27/2022 05/12/22   Hughie Closs, MD  isoniazid (NYDRAZID) 300 MG tablet Take 1 tablet (300 mg total) by mouth daily. Patient not taking: Reported on 05/27/2022 05/12/22   Hughie Closs, MD  isosorbide mononitrate (IMDUR) 60 MG 24  hr tablet Take 1 tablet (60 mg total) by mouth daily. Patient not taking: Reported on 05/27/2022 05/12/22 06/11/22  Hughie Closs, MD  mirtazapine (REMERON) 7.5 MG tablet Take 1 tablet (7.5 mg total) by mouth at bedtime. Patient not taking: Reported on 05/27/2022 05/12/22 06/11/22  Hughie Closs, MD  nitroGLYCERIN (NITROSTAT) 0.4 MG SL tablet Place 1 tablet (0.4 mg total) under the tongue every 5 (five) minutes as needed for chest pain (CP or SOB). Patient not taking: Reported on 05/27/2022 07/22/17   Albertine Grates, MD  pyridOXINE (VITAMIN B6) 50 MG tablet Take 1 tablet (50 mg total) by mouth daily. Patient not taking: Reported on 05/04/2022 03/29/22   Gardiner Barefoot, MD  traZODone (DESYREL) 100 MG tablet TAKE 1/2 TO 1 TABLET BY MOUTH AT BEDTIME AS NEEDED FOR SLEEP. 06/04/22   Grayce Sessions, NP    Physical Exam: Vitals:   09/26/22 1430 09/26/22 1500 09/26/22 1530 09/26/22 1600  BP: (!) 166/85 (!) 147/76 (!) 160/79 (!) 146/74  Pulse:      Resp: 18 16 12 12   Temp:  97.8 F (36.6 C)    TempSrc:      SpO2:  98% 98% 98%  Weight:      Height:        Constitutional: NAD, calm, comfortable Vitals:   09/26/22 1430 09/26/22 1500 09/26/22 1530 09/26/22 1600  BP: (!) 166/85 (!) 147/76 (!) 160/79 (!) 146/74  Pulse:      Resp: 18 16 12 12   Temp:   97.8 F (36.6 C)    TempSrc:      SpO2:  98% 98% 98%  Weight:      Height:       Eyes: PERRL, lids and conjunctivae normal ENMT: Mucous membranes are moist. Posterior pharynx clear of any exudate or lesions.Normal dentition.  Neck: normal, supple, no masses, no thyromegaly Respiratory: clear to auscultation bilaterally, no wheezing, no crackles. Normal respiratory effort. No accessory muscle use.  Cardiovascular: Regular rate and rhythm, no murmurs / rubs / gallops. No extremity edema. 2+ pedal pulses. No carotid bruits.  Abdomen: no tenderness, no masses palpated. No hepatosplenomegaly. Bowel sounds positive.  Musculoskeletal: no clubbing / cyanosis. No joint deformity upper and lower extremities. Good ROM, no contractures. Normal muscle tone.  Skin: no rashes, lesions, ulcers. No induration Neurologic: CN 2-12 grossly intact. Sensation intact, DTR normal. Strength 5/5 in all 4.  Psychiatric: Normal judgment and insight. Alert and oriented x 3. Normal mood.    Labs on Admission: I have personally reviewed following labs and imaging studies  CBC: Recent Labs  Lab 09/26/22 1038  WBC 6.9  HGB 11.5*  HCT 37.0*  MCV 101.6*  PLT 144*   Basic Metabolic Panel: Recent Labs  Lab 09/26/22 1038  NA 137  K 4.5  CL 98  CO2 22  GLUCOSE 155*  BUN 52*  CREATININE 13.27*  CALCIUM 8.7*   GFR: Estimated Creatinine Clearance: 6.7 mL/min (A) (by C-G formula based on SCr of 13.27 mg/dL (H)). Liver Function Tests: Recent Labs  Lab 09/26/22 1038  AST 10*  ALT 11  ALKPHOS 178*  BILITOT 0.6  PROT 7.4  ALBUMIN 3.1*   No results for input(s): "LIPASE", "AMYLASE" in the last 168 hours. No results for input(s): "AMMONIA" in the last 168 hours. Coagulation Profile: No results for input(s): "INR", "PROTIME" in the last 168 hours. Cardiac Enzymes: No results for input(s): "CKTOTAL", "CKMB", "CKMBINDEX", "TROPONINI" in the last 168 hours. BNP (last 3 results) No  results for input(s):  "PROBNP" in the last 8760 hours. HbA1C: No results for input(s): "HGBA1C" in the last 72 hours. CBG: No results for input(s): "GLUCAP" in the last 168 hours. Lipid Profile: No results for input(s): "CHOL", "HDL", "LDLCALC", "TRIG", "CHOLHDL", "LDLDIRECT" in the last 72 hours. Thyroid Function Tests: No results for input(s): "TSH", "T4TOTAL", "FREET4", "T3FREE", "THYROIDAB" in the last 72 hours. Anemia Panel: No results for input(s): "VITAMINB12", "FOLATE", "FERRITIN", "TIBC", "IRON", "RETICCTPCT" in the last 72 hours. Urine analysis:    Component Value Date/Time   COLORURINE YELLOW 04/23/2016 2336   APPEARANCEUR CLEAR 04/23/2016 2336   LABSPEC 1.012 04/23/2016 2336   PHURINE 5.0 04/23/2016 2336   GLUCOSEU 50 (A) 04/23/2016 2336   HGBUR SMALL (A) 04/23/2016 2336   BILIRUBINUR NEGATIVE 04/23/2016 2336   KETONESUR NEGATIVE 04/23/2016 2336   PROTEINUR >=300 (A) 04/23/2016 2336   UROBILINOGEN 0.2 01/11/2015 1740   NITRITE NEGATIVE 04/23/2016 2336   LEUKOCYTESUR NEGATIVE 04/23/2016 2336    Radiological Exams on Admission: DG Chest Port 1 View  Result Date: 09/26/2022 CLINICAL DATA:  Chest pain and shortness of breath this morning. Hemodialysis patient. EXAM: PORTABLE CHEST 1 VIEW COMPARISON:  Radiographs 05/07/2022 and 05/04/2022. FINDINGS: 1128 hours. Lordotic positioning. Right IJ hemodialysis catheter projects to the lower SVC level. The heart size and mediastinal contours are stable. The lungs remain clear. There is no pleural effusion pneumothorax. No acute osseous findings are evident. Telemetry leads overlie the chest. IMPRESSION: No evidence of acute cardiopulmonary process. Right IJ hemodialysis catheter tip projects to the lower SVC level. Electronically Signed   By: Carey Bullocks M.D.   On: 09/26/2022 11:45    EKG: Independently reviewed.  Mild ST elevation in the anterior leads, consistent with prior EKGs.  Assessment/Plan Principal Problem:   Unstable angina  (HCC) Active Problems:   Essential hypertension   OSA (obstructive sleep apnea)   TB lung, latent   ESRD on dialysis (HCC)   Chest pain/unstable angina: Previous history of unstable angina, as mentioned above in details.  Will follow cardiac enzymes, cardiology consulted.  Will keep him n.p.o. from midnight in case cardiology plans to do cardiac cath tomorrow.  Monitor on telemetry.  Patient was discharged on both aspirin and Plavix during recent hospitalization per cardiology recommendations but currently he appears to be only on aspirin which I will resume.  Will defer to cardiology for Plavix.  ESRD on HD: EDP to consult nephrology.  Patient is due for dialysis today.  Essential hypertension: Blood pressure controlled.  Resume home medications.  Hyperlipidemia: Looks like he is not taking his atorvastatin however we will resume that here.   DVT prophylaxis: heparin bolus via infusion 4,000 Units Start: 09/26/22 1645 Code Status: DNR Family Communication: Sister present at bedside.  Plan of care discussed with patient in length and he verbalized understanding and agreed with it. Disposition Plan: To be determined based on cardiology's decision. Consults called: Cardiology and nephrology, called by ED   Hughie Closs MD [facility]  *Please note that this is a verbal dictation therefore any spelling or grammatical errors are due to the "Dragon Medical One" system interpretation.  Please page via Amion and do not message via secure chat for urgent patient care matters. Secure chat can be used for non urgent patient care matters. 09/26/2022, 4:56 PM  To contact the attending provider between 7A-7P or the covering provider during after hours 7P-7A, please log into the web site [email or web address]

## 2022-09-26 NOTE — Progress Notes (Signed)
ANTICOAGULATION CONSULT NOTE - Initial Consult  Pharmacy Consult for Heparin Indication: chest pain/ACS  Allergies  Allergen Reactions   Morphine And Related Shortness Of Breath and Other (See Comments)    Hallucination  Tolerates Norco/Vicodin    Patient Measurements: Height: 6\' 1"  (185.4 cm) Weight: 79.4 kg (175 lb) IBW/kg (Calculated) : 79.9 Heparin Dosing Weight: 79.4 kg  Vital Signs: Temp: 97.8 F (36.6 C) (05/13 1500) Temp Source: Oral (05/13 1046) BP: 146/74 (05/13 1600) Pulse Rate: 86 (05/13 1200)  Labs: Recent Labs    09/26/22 1038 09/26/22 1221  HGB 11.5*  --   HCT 37.0*  --   PLT 144*  --   CREATININE 13.27*  --   TROPONINIHS 31* 46*    Estimated Creatinine Clearance: 6.7 mL/min (A) (by C-G formula based on SCr of 13.27 mg/dL (H)).   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 Patient is presenting with chest pain. Heparin per pharmacy consult placed for chest pain/ACS.  Patient is not on anticoagulation prior to arrival.  Hgb 11.5; plt 144  Goal of Therapy:  Heparin level 0.3-0.7 units/ml Monitor platelets by anticoagulation protocol: Yes   Plan:  Give IV heparin 4000 units bolus x  1 Start heparin infusion at 950 units/hr Check anti-Xa level in 8 hours and daily while on heparin Continue to monitor H&H and platelets  Delmar Landau, PharmD, BCPS 09/26/2022 4:31 PM ED Clinical Pharmacist -  [phone]

## 2022-09-26 NOTE — ED Triage Notes (Addendum)
PT BIB EMS from home for non radiating chest pain, pt took 324 ASA and EMS gave 1 tab nitro. Pt reported 4/10 pain relief from nitro. Dialysis MWF, none missed. A&Ox4. Pt denies SOB at this time.   EMS VS 124/64 90 HR 96% RA BG 127

## 2022-09-27 ENCOUNTER — Ambulatory Visit (HOSPITAL_COMMUNITY)
Admission: EM | Discharge status: Against Medical Advice | Payer: Self-pay | Source: Home / Self Care | Attending: Emergency Medicine

## 2022-09-27 ENCOUNTER — Other Ambulatory Visit: Payer: Medicaid Other | Admitting: Obstetrics and Gynecology

## 2022-09-27 DIAGNOSIS — I200 Unstable angina: Secondary | ICD-10-CM | POA: Diagnosis not present

## 2022-09-27 DIAGNOSIS — I2510 Atherosclerotic heart disease of native coronary artery without angina pectoris: Secondary | ICD-10-CM

## 2022-09-27 DIAGNOSIS — E7800 Pure hypercholesterolemia, unspecified: Secondary | ICD-10-CM | POA: Diagnosis not present

## 2022-09-27 DIAGNOSIS — I132 Hypertensive heart and chronic kidney disease with heart failure and with stage 5 chronic kidney disease, or end stage renal disease: Secondary | ICD-10-CM | POA: Diagnosis not present

## 2022-09-27 DIAGNOSIS — F17210 Nicotine dependence, cigarettes, uncomplicated: Secondary | ICD-10-CM | POA: Diagnosis not present

## 2022-09-27 DIAGNOSIS — Z992 Dependence on renal dialysis: Secondary | ICD-10-CM | POA: Diagnosis not present

## 2022-09-27 DIAGNOSIS — I214 Non-ST elevation (NSTEMI) myocardial infarction: Secondary | ICD-10-CM | POA: Diagnosis not present

## 2022-09-27 DIAGNOSIS — I5042 Chronic combined systolic (congestive) and diastolic (congestive) heart failure: Secondary | ICD-10-CM | POA: Diagnosis not present

## 2022-09-27 DIAGNOSIS — R079 Chest pain, unspecified: Secondary | ICD-10-CM | POA: Diagnosis not present

## 2022-09-27 DIAGNOSIS — N186 End stage renal disease: Secondary | ICD-10-CM | POA: Diagnosis not present

## 2022-09-27 DIAGNOSIS — Z7982 Long term (current) use of aspirin: Secondary | ICD-10-CM | POA: Diagnosis not present

## 2022-09-27 DIAGNOSIS — E1122 Type 2 diabetes mellitus with diabetic chronic kidney disease: Secondary | ICD-10-CM | POA: Diagnosis not present

## 2022-09-27 DIAGNOSIS — Z79899 Other long term (current) drug therapy: Secondary | ICD-10-CM | POA: Diagnosis not present

## 2022-09-27 DIAGNOSIS — I10 Essential (primary) hypertension: Secondary | ICD-10-CM | POA: Diagnosis not present

## 2022-09-27 DIAGNOSIS — Z8673 Personal history of transient ischemic attack (TIA), and cerebral infarction without residual deficits: Secondary | ICD-10-CM | POA: Diagnosis not present

## 2022-09-27 DIAGNOSIS — Z720 Tobacco use: Secondary | ICD-10-CM | POA: Diagnosis not present

## 2022-09-27 HISTORY — PX: CORONARY STENT INTERVENTION: CATH118234

## 2022-09-27 HISTORY — PX: CORONARY LITHOTRIPSY: CATH118330

## 2022-09-27 HISTORY — PX: LEFT HEART CATH AND CORONARY ANGIOGRAPHY: CATH118249

## 2022-09-27 LAB — CBC
HCT: 34.3 % — ABNORMAL LOW (ref 39.0–52.0)
Hemoglobin: 10.7 g/dL — ABNORMAL LOW (ref 13.0–17.0)
MCH: 31.8 pg (ref 26.0–34.0)
MCHC: 31.2 g/dL (ref 30.0–36.0)
MCV: 101.8 fL — ABNORMAL HIGH (ref 80.0–100.0)
Platelets: 162 10*3/uL (ref 150–400)
RBC: 3.37 MIL/uL — ABNORMAL LOW (ref 4.22–5.81)
RDW: 14.1 % (ref 11.5–15.5)
WBC: 6.4 10*3/uL (ref 4.0–10.5)
nRBC: 0 % (ref 0.0–0.2)

## 2022-09-27 LAB — BASIC METABOLIC PANEL
Anion gap: 17 — ABNORMAL HIGH (ref 5–15)
BUN: 65 mg/dL — ABNORMAL HIGH (ref 6–20)
CO2: 23 mmol/L (ref 22–32)
Calcium: 8.1 mg/dL — ABNORMAL LOW (ref 8.9–10.3)
Chloride: 98 mmol/L (ref 98–111)
Creatinine, Ser: 14.79 mg/dL — ABNORMAL HIGH (ref 0.61–1.24)
GFR, Estimated: 3 mL/min — ABNORMAL LOW (ref >60)
Glucose, Bld: 113 mg/dL — ABNORMAL HIGH (ref 70–99)
Potassium: 4.5 mmol/L (ref 3.5–5.1)
Sodium: 138 mmol/L (ref 135–145)

## 2022-09-27 LAB — LIPID PANEL
Cholesterol: 131 mg/dL (ref 0–200)
HDL: 38 mg/dL — ABNORMAL LOW (ref >40)
LDL Cholesterol: 77 mg/dL (ref 0–99)
Total CHOL/HDL Ratio: 3.4 RATIO
Triglycerides: 79 mg/dL (ref <150)
VLDL: 16 mg/dL (ref 0–40)

## 2022-09-27 LAB — POCT ACTIVATED CLOTTING TIME
Activated Clotting Time: 255 seconds
Activated Clotting Time: 303 seconds

## 2022-09-27 LAB — HEPARIN LEVEL (UNFRACTIONATED)
Heparin Unfractionated: 0.26 IU/mL — ABNORMAL LOW (ref 0.30–0.70)
Heparin Unfractionated: 0.32 IU/mL (ref 0.30–0.70)

## 2022-09-27 LAB — CREATININE, SERUM
Creatinine, Ser: 15.22 mg/dL — ABNORMAL HIGH (ref 0.61–1.24)
GFR, Estimated: 3 mL/min — ABNORMAL LOW (ref >60)

## 2022-09-27 LAB — PHOSPHORUS: Phosphorus: 7.8 mg/dL — ABNORMAL HIGH (ref 2.5–4.6)

## 2022-09-27 LAB — TROPONIN I (HIGH SENSITIVITY): Troponin I (High Sensitivity): 790 ng/L (ref <18)

## 2022-09-27 LAB — HEPATITIS B SURFACE ANTIGEN: Hepatitis B Surface Ag: NONREACTIVE

## 2022-09-27 SURGERY — LEFT HEART CATH AND CORONARY ANGIOGRAPHY
Anesthesia: LOCAL

## 2022-09-27 MED ORDER — LABETALOL HCL 5 MG/ML IV SOLN: 10.0000 mg | INTRAVENOUS | Status: DC | PRN

## 2022-09-27 MED ORDER — CLOPIDOGREL BISULFATE 300 MG PO TABS
ORAL_TABLET | ORAL | Status: DC | PRN
  Administered 2022-09-27: 600 mg via ORAL

## 2022-09-27 MED ORDER — SODIUM CHLORIDE 0.9 % IV SOLN: 250.0000 mL | INTRAVENOUS | Status: DC | PRN

## 2022-09-27 MED ORDER — FAMOTIDINE IN NACL 20-0.9 MG/50ML-% IV SOLN
INTRAVENOUS | Status: DC | PRN
  Administered 2022-09-27: 20 mg via INTRAVENOUS

## 2022-09-27 MED ORDER — HEPARIN SODIUM (PORCINE) 1000 UNIT/ML IJ SOLN
INTRAMUSCULAR | Status: AC
  Filled 2022-09-27: qty 10

## 2022-09-27 MED ORDER — CHLORHEXIDINE GLUCONATE CLOTH 2 % EX PADS
6.0000 | MEDICATED_PAD | Freq: Every day | CUTANEOUS | Status: DC
  Administered 2022-09-27: 6 via TOPICAL

## 2022-09-27 MED ORDER — MIDAZOLAM HCL 2 MG/2ML IJ SOLN
INTRAMUSCULAR | Status: AC
  Filled 2022-09-27: qty 2

## 2022-09-27 MED ORDER — HEPARIN SODIUM (PORCINE) 5000 UNIT/ML IJ SOLN: 5000.0000 [IU] | Freq: Three times a day (TID) | INTRAMUSCULAR | Status: DC

## 2022-09-27 MED ORDER — LIDOCAINE-EPINEPHRINE 1 %-1:100000 IJ SOLN
INTRAMUSCULAR | Status: AC
  Filled 2022-09-27: qty 1

## 2022-09-27 MED ORDER — CHLORHEXIDINE GLUCONATE CLOTH 2 % EX PADS: 6.0000 | MEDICATED_PAD | Freq: Every day | CUTANEOUS | Status: DC

## 2022-09-27 MED ORDER — MIDAZOLAM HCL 2 MG/2ML IJ SOLN
INTRAMUSCULAR | Status: DC | PRN
  Administered 2022-09-27 (×2): 1 mg via INTRAVENOUS

## 2022-09-27 MED ORDER — FAMOTIDINE IN NACL 20-0.9 MG/50ML-% IV SOLN
INTRAVENOUS | Status: AC
  Filled 2022-09-27: qty 50

## 2022-09-27 MED ORDER — LIDOCAINE-EPINEPHRINE 1 %-1:100000 IJ SOLN
INTRAMUSCULAR | Status: DC | PRN
  Administered 2022-09-27: 10 mL

## 2022-09-27 MED ORDER — SODIUM CHLORIDE 0.9% FLUSH: 3.0000 mL | Freq: Two times a day (BID) | INTRAVENOUS | Status: DC

## 2022-09-27 MED ORDER — HEPARIN (PORCINE) IN NACL 1000-0.9 UT/500ML-% IV SOLN
INTRAVENOUS | Status: DC | PRN
  Administered 2022-09-27 (×2): 500 mL

## 2022-09-27 MED ORDER — ONDANSETRON HCL 4 MG/2ML IJ SOLN: 4.0000 mg | Freq: Four times a day (QID) | INTRAMUSCULAR | Status: DC | PRN

## 2022-09-27 MED ORDER — CLOPIDOGREL BISULFATE 75 MG PO TABS: 75.0000 mg | ORAL_TABLET | Freq: Every day | ORAL | Status: DC

## 2022-09-27 MED ORDER — SODIUM CHLORIDE 0.9 % IV SOLN: INTRAVENOUS | Status: DC

## 2022-09-27 MED ORDER — LIDOCAINE HCL (PF) 1 % IJ SOLN
INTRAMUSCULAR | Status: DC | PRN
  Administered 2022-09-27: 10 mL

## 2022-09-27 MED ORDER — SODIUM CHLORIDE 0.9% FLUSH: 3.0000 mL | INTRAVENOUS | Status: DC | PRN

## 2022-09-27 MED ORDER — HYDRALAZINE HCL 20 MG/ML IJ SOLN: 10.0000 mg | INTRAMUSCULAR | Status: DC | PRN

## 2022-09-27 MED ORDER — ASPIRIN 81 MG PO CHEW
81.0000 mg | CHEWABLE_TABLET | ORAL | Status: AC
  Administered 2022-09-27: 81 mg via ORAL
  Filled 2022-09-27: qty 1

## 2022-09-27 MED ORDER — ZOLPIDEM TARTRATE 5 MG PO TABS
5.0000 mg | ORAL_TABLET | Freq: Every evening | ORAL | Status: DC | PRN
  Filled 2022-09-27: qty 1

## 2022-09-27 MED ORDER — CLOPIDOGREL BISULFATE 300 MG PO TABS
ORAL_TABLET | ORAL | Status: AC
  Filled 2022-09-27: qty 2

## 2022-09-27 MED ORDER — IOHEXOL 350 MG/ML SOLN
INTRAVENOUS | Status: DC | PRN
  Administered 2022-09-27: 155 mL

## 2022-09-27 MED ORDER — ACETAMINOPHEN 325 MG PO TABS: 650.0000 mg | ORAL_TABLET | ORAL | Status: DC | PRN

## 2022-09-27 MED ORDER — FENTANYL CITRATE (PF) 100 MCG/2ML IJ SOLN
INTRAMUSCULAR | Status: AC
  Filled 2022-09-27: qty 2

## 2022-09-27 MED ORDER — HEPARIN SODIUM (PORCINE) 1000 UNIT/ML IJ SOLN
INTRAMUSCULAR | Status: DC | PRN
  Administered 2022-09-27: 8000 [IU] via INTRAVENOUS
  Administered 2022-09-27: 3000 [IU] via INTRAVENOUS

## 2022-09-27 MED ORDER — NITROGLYCERIN 1 MG/10 ML FOR IR/CATH LAB
INTRA_ARTERIAL | Status: AC
  Filled 2022-09-27: qty 10

## 2022-09-27 MED ORDER — LIDOCAINE HCL (PF) 1 % IJ SOLN
INTRAMUSCULAR | Status: AC
  Filled 2022-09-27: qty 30

## 2022-09-27 MED ORDER — FENTANYL CITRATE (PF) 100 MCG/2ML IJ SOLN
INTRAMUSCULAR | Status: DC | PRN
  Administered 2022-09-27 (×2): 25 ug via INTRAVENOUS

## 2022-09-27 SURGICAL SUPPLY — 23 items
BALL SAPPHIRE NC24 3.0X10 (BALLOONS) ×1
BALLOON SAPPHIRE NC24 3.0X10 (BALLOONS) IMPLANT
CATH INFINITI 5 FR 3DRC (CATHETERS) IMPLANT
CATH INFINITI 5FR MULTPACK ANG (CATHETERS) IMPLANT
CATH LAUNCHER 6FR AL.75 (CATHETERS) IMPLANT
CATH LAUNCHER 6FR AL1 (CATHETERS) IMPLANT
CATH SHOCKWAVE C2 2.5X12 (CATHETERS) IMPLANT
CATH VISTA GUIDE 6FR 3DRC (CATHETERS) IMPLANT
CATHETER LAUNCHER 6FR AL1 (CATHETERS) ×1
CLOSURE PERCLOSE PROSTYLE (VASCULAR PRODUCTS) IMPLANT
KIT ENCORE 26 ADVANTAGE (KITS) IMPLANT
KIT HEART LEFT (KITS) ×1 IMPLANT
KIT MICROPUNCTURE NIT STIFF (SHEATH) IMPLANT
PACK CARDIAC CATHETERIZATION (CUSTOM PROCEDURE TRAY) ×1 IMPLANT
SHEATH PINNACLE 5F 10CM (SHEATH) IMPLANT
SHEATH PINNACLE 6F 10CM (SHEATH) IMPLANT
SHEATH PROBE COVER 6X72 (BAG) IMPLANT
STENT SYNERGY XD 2.75X16 (Permanent Stent) IMPLANT
SYNERGY XD 2.75X16 (Permanent Stent) ×1 IMPLANT
TRANSDUCER W/STOPCOCK (MISCELLANEOUS) ×1 IMPLANT
TUBING CIL FLEX 10 FLL-RA (TUBING) ×1 IMPLANT
WIRE EMERALD 3MM-J .035X150CM (WIRE) IMPLANT
WIRE RUNTHROUGH .014X180CM (WIRE) IMPLANT

## 2022-09-27 NOTE — Progress Notes (Signed)
Received patient in bed to unit.  Alert and oriented.  Informed consent signed and in chart. Yes  TX duration:1 hour 50 minutes, Pt request to terminate treatment.  Patient tolerated well. Pt's blood pressure was labile. 300 ml normal.saline was administered. Transported back to the room  Alert, without acute distress.  Hand-off given to patient's nurse.   Access used: RIJ perm- cath Access issues: Blood flow inturrupted when patient caughed.  Total UF removed: 0 Medication(s) given: 0 Post HD VS: 146/49 HR75 map-67 Post HD weight: 87.7   [facility]

## 2022-09-27 NOTE — Patient Outreach (Signed)
RNCM returned phone call of patient's niece.  All questions answered.  Kathi Der RN, BSN [place]  [employer] - Managed Medicaid High Risk [phone]

## 2022-09-27 NOTE — H&P (View-Only) (Signed)
 Rounding Note    Patient Name: Cody Webster Date of Encounter: 09/27/2022  [facility] Cardiologist: David Harding, MD   Subjective   Denies any chest pain overnight. No SOB. Missed dialysis session yesterday  Inpatient Medications    Scheduled Meds:  aspirin EC  81 mg Oral QPM   atorvastatin  40 mg Oral Daily   carvedilol  25 mg Oral BID WC   Chlorhexidine Gluconate Cloth  6 each Topical Daily   cloNIDine  0.1 mg Oral TID   Continuous Infusions:  heparin 1,100 Units/hr (09/27/22 0359)   PRN Meds: acetaminophen, nitroGLYCERIN, ondansetron (ZOFRAN) IV, zolpidem   Vital Signs    Vitals:   09/26/22 1935 09/26/22 2144 09/26/22 2331 09/27/22 0306  BP: 133/71 (!) 132/59 (!) 109/56 (!) 106/51  Pulse: 78  72 68  Resp: 18  18 18  Temp: 98.2 F (36.8 C)  98.5 F (36.9 C) 98.4 F (36.9 C)  TempSrc: Oral  Oral Oral  SpO2: 100%  93%   Weight:      Height:       No intake or output data in the 24 hours ending 09/27/22 0816    09/26/2022   10:52 AM 07/14/2022    8:32 AM 05/12/2022   12:40 PM  Last 3 Weights  Weight (lbs) 175 lb 173 lb 160 lb 11.5 oz  Weight (kg) 79.379 kg 78.472 kg 72.9 kg      Telemetry    NSR without significant ventricular ectopy - Personally Reviewed  ECG    NSR without significant ST-T wave changes - Personally Reviewed  Physical Exam   GEN: No acute distress.   Neck: No JVD Cardiac: RRR, no murmurs, rubs, or gallops.  Respiratory: Clear to auscultation bilaterally. GI: Soft, nontender, non-distended  MS: No edema; No deformity. Neuro:  Nonfocal  Psych: Normal affect   Labs    High Sensitivity Troponin:   Recent Labs  Lab 09/26/22 1038 09/26/22 1221 09/26/22 2214 09/27/22 0257  TROPONINIHS 31* 46* 636* 790*     Chemistry Recent Labs  Lab 09/26/22 1038 09/26/22 2214  NA 137  --   K 4.5  --   CL 98  --   CO2 22  --   GLUCOSE 155*  --   BUN 52*  --   CREATININE 13.27* 14.13*  CALCIUM 8.7*  --   PROT 7.4   --   ALBUMIN 3.1*  --   AST 10*  --   ALT 11  --   ALKPHOS 178*  --   BILITOT 0.6  --   GFRNONAA 4* 4*  ANIONGAP 17*  --     Lipids  Recent Labs  Lab 09/27/22 0257  CHOL 131  TRIG 79  HDL 38*  LDLCALC 77  CHOLHDL 3.4    Hematology Recent Labs  Lab 09/26/22 1038 09/26/22 2214  WBC 6.9 7.3  RBC 3.64* 3.02*  HGB 11.5* 9.7*  HCT 37.0* 30.6*  MCV 101.6* 101.3*  MCH 31.6 32.1  MCHC 31.1 31.7  RDW 14.2 14.2  PLT 144* 149*   Thyroid No results for input(s): "TSH", "FREET4" in the last 168 hours.  BNPNo results for input(s): "BNP", "PROBNP" in the last 168 hours.  DDimer No results for input(s): "DDIMER" in the last 168 hours.   Radiology    DG Chest Port 1 View  Result Date: 09/26/2022 CLINICAL DATA:  Chest pain and shortness of breath this morning. Hemodialysis patient. EXAM: PORTABLE CHEST 1 VIEW COMPARISON:    Radiographs 05/07/2022 and 05/04/2022. FINDINGS: 1128 hours. Lordotic positioning. Right IJ hemodialysis catheter projects to the lower SVC level. The heart size and mediastinal contours are stable. The lungs remain clear. There is no pleural effusion pneumothorax. No acute osseous findings are evident. Telemetry leads overlie the chest. IMPRESSION: No evidence of acute cardiopulmonary process. Right IJ hemodialysis catheter tip projects to the lower SVC level. Electronically Signed   By: William  Veazey M.D.   On: 09/26/2022 11:45    Cardiac Studies   Echo 05/05/2022  1. Left ventricular ejection fraction, by estimation, is 45 to 50%. The  left ventricle has mildly decreased function. The left ventricle  demonstrates regional wall motion abnormalities with basal to mid  anterolateral and basal to mid inferior  hypokinesis. There is mild concentric left ventricular hypertrophy. Left  ventricular diastolic parameters are consistent with Grade II diastolic  dysfunction (pseudonormalization).   2. Right ventricular systolic function is normal. The right ventricular   size is mildly enlarged. There is moderately elevated pulmonary artery  systolic pressure. The estimated right ventricular systolic pressure is  51.2 mmHg.   3. Left atrial size was severely dilated.   4. Right atrial size was mildly dilated.   5. The mitral valve is degenerative. Mild to moderate mitral valve  regurgitation. No evidence of mitral stenosis. Moderate mitral annular  calcification.   6. The aortic valve is tricuspid. There is mild calcification of the  aortic valve. Aortic valve regurgitation is not visualized. No aortic  stenosis is present.   7. The inferior vena cava is dilated in size with <50% respiratory  variability, suggesting right atrial pressure of 15 mmHg.   Patient Profile     57 y.o. male with PMH of ESRD on HD 04/2022, HTN, HLD, DM II, presumed ICM, chronic combined systolic/diastolic CHF, lacunar stroke 04/2022, tobacco abuse and PTSD who presented with chest pain and elevated troponin.  Assessment & Plan    NSTEMI  - abnormal myoview in 2016 with inferior ischemia, cath deferred due to CKD  - progressive DOE since Dec and chest pain now occurring at rest.   - Serial trop 31--> 46 --> 636 --> 790  - plan to proceed with left heart catheterization today. Likely will be this afternoon  Risk and benefit of procedure explained to the patient who display clear understanding and agree to proceed.  Discussed with patient possible procedural risk include bleeding, vascular injury, renal injury, arrythmia, MI, stroke and loss of limb or life.  Hypertension: On coreg and clonidine. Used to be imdur/hydralazine, patient reported not taking since January  HLD: on lipitor  DM II  Chronic combined systolic and diastolic CHF: consider D/C clonidine, restart previous Imdur/hydralazine  Lacunar stroke      For questions or updates, please contact [facility] Please consult [email or web address] for contact info under        Signed, Sanaz Scarlett, PA   09/27/2022, 8:16 AM    

## 2022-09-27 NOTE — Progress Notes (Signed)
   09/27/22 2230  Vitals  BP (!) 130/50  BP Location Left Arm  BP Method Automatic  Patient Position (if appropriate) Lying  Pulse Rate 68  Pulse Rate Source Monitor  Resp 14  Oxygen Therapy  SpO2 100 %  O2 Device Room Air  During Treatment Monitoring  Blood Flow Rate (mL/min) 400 mL/min  Arterial Pressure (mmHg) -170 mmHg  Venous Pressure (mmHg) 150 mmHg  TMP (mmHg) -2 mmHg  Ultrafiltration Rate (mL/min) 0 mL/min  Dialysate Flow Rate (mL/min) 300 ml/min  HD Safety Checks Performed Yes  Intra-Hemodialysis Comments Tx completed (Pt request to vdiscontinue dialysis.)

## 2022-09-27 NOTE — Progress Notes (Signed)
Patient's niece Cody Webster called and was concerned about the patient having slurred speech. After speaking with the patient, he tells me that he has had slurred speech ever since his stroke in December. He says he is not concerned at all about his slurred speech.

## 2022-09-27 NOTE — Progress Notes (Signed)
Patient off floor to cath lab.  

## 2022-09-27 NOTE — Interval H&P Note (Signed)
History and Physical Interval Note:  09/27/2022 4:41 PM  Cody Webster  has presented today for surgery, with the diagnosis of NSTEMI.  The various methods of treatment have been discussed with the patient and family. After consideration of risks, benefits and other options for treatment, the patient has consented to  Procedure(s): LEFT HEART CATH AND CORONARY ANGIOGRAPHY (N/A) as a surgical intervention.  The patient's history has been reviewed, patient examined, no change in status, stable for surgery.  I have reviewed the patient's chart and labs.  Questions were answered to the patient's satisfaction.     [name]

## 2022-09-27 NOTE — Progress Notes (Signed)
ANTICOAGULATION CONSULT NOTE -  Consult  Pharmacy Consult for Heparin Indication: chest pain/ACS  Allergies  Allergen Reactions   Morphine And Related Shortness Of Breath and Other (See Comments)    Hallucination  Tolerates Norco/Vicodin    Patient Measurements: Height: 6\' 1"  (185.4 cm) Weight: 79.3 kg (174 lb 13.2 oz) IBW/kg (Calculated) : 79.9 Heparin Dosing Weight: 79.4 kg  Vital Signs: Temp: 97.9 F (36.6 C) (05/14 1211) Temp Source: Oral (05/14 1211) BP: 101/55 (05/14 1211) Pulse Rate: 70 (05/14 1211)  Labs: Recent Labs    09/26/22 1038 09/26/22 1221 09/26/22 2214 09/27/22 0256 09/27/22 0257 09/27/22 1154  HGB 11.5*  --  9.7*  --   --   --   HCT 37.0*  --  30.6*  --   --   --   PLT 144*  --  149*  --   --   --   HEPARINUNFRC  --   --   --   --  0.26* 0.32  CREATININE 13.27*  --  14.13* 14.79*  --   --   TROPONINIHS 31* 46* 636*  --  790*  --      Estimated Creatinine Clearance: 6 mL/min (A) (by C-G formula based on SCr of 14.79 mg/dL (H)).   Medical History: Past Medical History:  Diagnosis Date   Anemia    Carotid artery occlusion    Chronic combined systolic (congestive) and diastolic (congestive) heart failure (HCC)    a. 06/2014 Echo: EF 50%; b. 07/2017 Echo: EF 55-60%; c. 07/2020 Echo: EF 50-55%; d. 04/2022 Echo: EF 45-50%, basal-mid anterolateral and basal to mid inf HK. Mild conc LVH, GrII DD. NL RV fxn. RVSP 51.39mmHg. Sev dil LA. Mildly dil RA. MIld-mod MR.   Depression    ESRD (end stage renal disease) (HCC)    a. 04/2022 - initiated HD-->MWF   GERD (gastroesophageal reflux disease)    Hypertensive heart disease    Ischemic cardiomyopathy    a. 06/2014 Echo: EF 50%; b. 07/2017 Echo: EF 55-60%; d. 04/2018 MV: EF 47%, moderate inf ischemia; e. 04/2022 Echo: EF 45-50%.   Latent tuberculosis    Peripheral neuropathy    Pneumonia    PTSD (post-traumatic stress disorder)    Seizures (HCC)    27 years ago   Stroke [facility])    a. 04/2022 MRI Brain:  small acute infarct - R posterior pons and subcortical L cerebral hemisphere. Chronic sm vessel dzs.   Tobacco abuse    Type II diabetes mellitus (HCC)    type 2    Medications:  Medications Prior to Admission  Medication Sig Dispense Refill Last Dose   acetaminophen (TYLENOL) 500 MG tablet Take 1,000 mg by mouth every 6 (six) hours as needed (for pain/headaches.). (Patient not taking: Reported on 05/27/2022)      aspirin EC 81 MG tablet Take 81 mg by mouth every evening.      atorvastatin (LIPITOR) 40 MG tablet Take 1 tablet (40 mg total) by mouth daily. (Patient not taking: Reported on 05/27/2022) 30 tablet 0    carvedilol (COREG) 25 MG tablet Take 1 tablet (25 mg total) by mouth 2 (two) times daily with a meal. 60 tablet 0    cetirizine (ZYRTEC) 10 MG tablet TAKE 1 TABLET (10 MG TOTAL) BY MOUTH DAILY. 90 tablet 6    cloNIDine (CATAPRES) 0.1 MG tablet TAKE ONE (1) TABLET BY MOUTH THREE TIMES DAILY. 90 tablet 1    fluticasone (FLONASE) 50 MCG/ACT nasal spray Place  2 sprays into both nostrils daily. (Patient not taking: Reported on 05/27/2022) 16 g 6    gabapentin (NEURONTIN) 300 MG capsule Take 1 capsule (300 mg total) by mouth 2 (two) times daily. (Patient not taking: Reported on 05/27/2022) 180 capsule 1    guaiFENesin (MUCINEX) 600 MG 12 hr tablet Take 1 tablet (600 mg total) by mouth 2 (two) times daily as needed for to loosen phlegm. (Patient not taking: Reported on 05/27/2022) 60 tablet 1    hydrALAZINE (APRESOLINE) 50 MG tablet Take 2 tablets (100 mg total) by mouth 3 (three) times daily. (Patient not taking: Reported on 05/27/2022) 180 tablet 0    isoniazid (NYDRAZID) 300 MG tablet Take 1 tablet (300 mg total) by mouth daily. (Patient not taking: Reported on 05/27/2022) 30 tablet 5    isosorbide mononitrate (IMDUR) 60 MG 24 hr tablet Take 1 tablet (60 mg total) by mouth daily. (Patient not taking: Reported on 05/27/2022) 30 tablet 0    mirtazapine (REMERON) 7.5 MG tablet Take 1 tablet (7.5 mg  total) by mouth at bedtime. (Patient not taking: Reported on 05/27/2022) 30 tablet 0    nitroGLYCERIN (NITROSTAT) 0.4 MG SL tablet Place 1 tablet (0.4 mg total) under the tongue every 5 (five) minutes as needed for chest pain (CP or SOB). (Patient not taking: Reported on 05/27/2022) 30 tablet 0    pyridOXINE (VITAMIN B6) 50 MG tablet Take 1 tablet (50 mg total) by mouth daily. (Patient not taking: Reported on 05/04/2022) 30 tablet 5    traZODone (DESYREL) 100 MG tablet TAKE 1/2 TO 1 TABLET BY MOUTH AT BEDTIME AS NEEDED FOR SLEEP. 90 tablet 1     Scheduled:   aspirin  81 mg Oral Pre-Cath   aspirin EC  81 mg Oral QPM   atorvastatin  40 mg Oral Daily   carvedilol  25 mg Oral BID WC   Chlorhexidine Gluconate Cloth  6 each Topical Daily   cloNIDine  0.1 mg Oral TID   sodium chloride flush  3 mL Intravenous Q12H   Infusions:   sodium chloride     sodium chloride     heparin 1,100 Units/hr (09/27/22 0359)   PRN: sodium chloride, acetaminophen, nitroGLYCERIN, ondansetron (ZOFRAN) IV, sodium chloride flush, zolpidem  Assessment: 57 yom with a history of ESRD HD MWF, [place], T2DM, HTN, OSA, anemia, latent TB, HF, CVA. Patient is presenting with chest pain. Heparin per pharmacy consult placed for chest pain/ACS. Patient is not on anticoagulation prior to arrival.  Heparin drip 1100 uts/hr HL 0.32 at goal   Goal of Therapy:  Heparin level 0.3-0.7 units/ml Monitor platelets by anticoagulation protocol: Yes   Plan:  heparin infusion 1100 units/hr Daily heparin level and CBC  Monitor s/s bleeding     Leota Sauers Pharm.D. CPP, BCPS Clinical Pharmacist [phone] 09/27/2022 1:31 PM

## 2022-09-27 NOTE — Progress Notes (Signed)
PROGRESS NOTE    Cody Webster  ZOX:[id number] DOB: [birth year] DOA: 09/26/2022 PCP: Grayce Sessions, NP   Brief Narrative:  Cody Webster is a 57 y.o. male with medical history significant of CKD5 which turned out to be ESRD during recent hospitalization few months ago and started on hemodialysis, MWF schedule,, HTN, GERD, latent TB presented to ED with a complaint of chest pain.  He called EMS, and route he was given nitroglycerin which eased up the pain a little bit but by the time he arrived to the ED, his pain worsened again.  He received another nitroglycerin in the ED and pain resolved.    Of note, during recent hospitalization in December 2023, patient was diagnosed with unstable angina with significantly elevated troponins, echo showed 45% ejection fraction, slightly reduced from before.  Cardiology was planning to do cardiac cath however he had a stroke during that hospitalization so cath was deferred to outpatient.  Patient tells me that he followed up with cardiology 2 months ago and he does not recall any discussion about cath during that office visit.    Upon arrival to ED, he was fairly hemodynamically stable. Troponin only slightly elevated at 31> 46.  Admitted to hospitalist, cardiology consulted and plan for cardiac cath today.  Assessment & Plan:   Principal Problem:   Unstable angina (HCC) Active Problems:   Essential hypertension   OSA (obstructive sleep apnea)   TB lung, latent   ESRD on dialysis [facility])  NSTEMI: Previous history of unstable angina, as mentioned above in details. abnormal myoview in 2016 with inferior ischemia, cath deferred due to CKD.  Troponin once again jumped significantly all the way from 31-7 98 peak.  Seen by cardiology, started on heparin drip.  Aspirin continued.  Plan for cardiac cath today.  Patient asymptomatic today.  ESRD on HD: I consulted nephrology personally, spoke to Dr. Arlean Hopping.   Essential hypertension: Blood pressure  controlled.  Continue home medications.   Hyperlipidemia: He stopped taking atorvastatin but we have resumed that.  Chronic combined systolic and diastolic heart failure: Appears euvolemic.  Management per cardiology.  DVT prophylaxis: Currently on heparin drip   Code Status: DNR  Family Communication:  None present at bedside.  Plan of care discussed with patient in length and he/she verbalized understanding and agreed with it.  Status is: Observation The patient will require care spanning > 2 midnights and should be moved to inpatient because: Patient is scheduled for cardiac cath today.   Estimated body mass index is 23.09 kg/m as calculated from the following:   Height as of this encounter: 6\' 1"  (1.854 m).   Weight as of this encounter: 79.4 kg.    Nutritional Assessment: Body mass index is 23.09 kg/m.Marland Kitchen Seen by dietician.  I agree with the assessment and plan as outlined below: Nutrition Status:        . Skin Assessment: I have examined the patient's skin and I agree with the wound assessment as performed by the wound care RN as outlined below:    Consultants:  Cardiology  Procedures:  None  Antimicrobials:  Anti-infectives (From admission, onward)    None         Subjective: Seen and examined this morning.  No chest pain or any other complaint.  Objective: Vitals:   09/26/22 2144 09/26/22 2331 09/27/22 0306 09/27/22 0908  BP: (!) 132/59 (!) 109/56 (!) 106/51 130/74  Pulse:  72 68 67  Resp:  18 18 18   Temp:  98.5 F (36.9 C) 98.4 F (36.9 C)   TempSrc:  Oral Oral   SpO2:  93%    Weight:      Height:        Intake/Output Summary (Last 24 hours) at 09/27/2022 1019 Last data filed at 09/27/2022 0900 Gross per 24 hour  Intake 0 ml  Output --  Net 0 ml   Filed Weights   09/26/22 1052  Weight: 79.4 kg    Examination:  General exam: Appears calm and comfortable  Respiratory system: Clear to auscultation. Respiratory effort  normal. Cardiovascular system: S1 & S2 heard, RRR. No JVD, murmurs, rubs, gallops or clicks. No pedal edema. Gastrointestinal system: Abdomen is nondistended, soft and nontender. No organomegaly or masses felt. Normal bowel sounds heard. Central nervous system: Alert and oriented. No focal neurological deficits. Extremities: Symmetric 5 x 5 power. Skin: No rashes, lesions or ulcers  Data Reviewed: I have personally reviewed following labs and imaging studies  CBC: Recent Labs  Lab 09/26/22 1038 09/26/22 2214  WBC 6.9 7.3  HGB 11.5* 9.7*  HCT 37.0* 30.6*  MCV 101.6* 101.3*  PLT 144* 149*   Basic Metabolic Panel: Recent Labs  Lab 09/26/22 1038 09/26/22 2214  NA 137  --   K 4.5  --   CL 98  --   CO2 22  --   GLUCOSE 155*  --   BUN 52*  --   CREATININE 13.27* 14.13*  CALCIUM 8.7*  --    GFR: Estimated Creatinine Clearance: 6.3 mL/min (A) (by C-G formula based on SCr of 14.13 mg/dL (H)). Liver Function Tests: Recent Labs  Lab 09/26/22 1038  AST 10*  ALT 11  ALKPHOS 178*  BILITOT 0.6  PROT 7.4  ALBUMIN 3.1*   No results for input(s): "LIPASE", "AMYLASE" in the last 168 hours. No results for input(s): "AMMONIA" in the last 168 hours. Coagulation Profile: No results for input(s): "INR", "PROTIME" in the last 168 hours. Cardiac Enzymes: No results for input(s): "CKTOTAL", "CKMB", "CKMBINDEX", "TROPONINI" in the last 168 hours. BNP (last 3 results) No results for input(s): "PROBNP" in the last 8760 hours. HbA1C: No results for input(s): "HGBA1C" in the last 72 hours. CBG: No results for input(s): "GLUCAP" in the last 168 hours. Lipid Profile: Recent Labs    09/27/22 0257  CHOL 131  HDL 38*  LDLCALC 77  TRIG 79  CHOLHDL 3.4   Thyroid Function Tests: No results for input(s): "TSH", "T4TOTAL", "FREET4", "T3FREE", "THYROIDAB" in the last 72 hours. Anemia Panel: No results for input(s): "VITAMINB12", "FOLATE", "FERRITIN", "TIBC", "IRON", "RETICCTPCT" in the  last 72 hours. Sepsis Labs: No results for input(s): "PROCALCITON", "LATICACIDVEN" in the last 168 hours.  Recent Results (from the past 240 hour(s))  MRSA Next Gen by PCR, Nasal     Status: None   Collection Time: 09/26/22  6:47 PM   Specimen: Nasal Mucosa; Nasal Swab  Result Value Ref Range Status   MRSA by PCR Next Gen NOT DETECTED NOT DETECTED Final    Comment: (NOTE) The GeneXpert MRSA Assay (FDA approved for NASAL specimens only), is one component of a comprehensive MRSA colonization surveillance program. It is not intended to diagnose MRSA infection nor to guide or monitor treatment for MRSA infections. Test performance is not FDA approved in patients less than 64 years old. Performed at [facility], [address], [place], [place] [address]      Radiology Studies: DG Chest Port 1 View  Result Date: 09/26/2022  CLINICAL DATA:  Chest pain and shortness of breath this morning. Hemodialysis patient. EXAM: PORTABLE CHEST 1 VIEW COMPARISON:  Radiographs 05/07/2022 and 05/04/2022. FINDINGS: 1128 hours. Lordotic positioning. Right IJ hemodialysis catheter projects to the lower SVC level. The heart size and mediastinal contours are stable. The lungs remain clear. There is no pleural effusion pneumothorax. No acute osseous findings are evident. Telemetry leads overlie the chest. IMPRESSION: No evidence of acute cardiopulmonary process. Right IJ hemodialysis catheter tip projects to the lower SVC level. Electronically Signed   By: Carey Bullocks M.D.   On: 09/26/2022 11:45    Scheduled Meds:  aspirin EC  81 mg Oral QPM   atorvastatin  40 mg Oral Daily   carvedilol  25 mg Oral BID WC   Chlorhexidine Gluconate Cloth  6 each Topical Daily   cloNIDine  0.1 mg Oral TID   Continuous Infusions:  heparin 1,100 Units/hr (09/27/22 0359)     LOS: 0 days   Cody Closs, MD [facility]  09/27/2022, 10:19 AM   *Please note that this is a verbal dictation therefore any  spelling or grammatical errors are due to the "Dragon Medical One" system interpretation.  Please page via Amion and do not message via secure chat for urgent patient care matters. Secure chat can be used for non urgent patient care matters.  How to contact the [facility] Attending or Consulting provider 7A - 7P or covering provider during after hours 7P -7A, for this patient?  Check the care team in [facility] and look for a) attending/consulting TR[facility] provider listed and b) the [facility] team listed. Page or secure chat 7A-7P. Log into [email or web address] and use [facility]'s universal password to access. If you do not have the password, please contact the hospital operator. Locate the [facility] provider you are looking for under [facility] and page to a number that you can be directly reached. If you still have difficulty reaching the provider, please page the [facility] (Director on Call) for the Hospitalists listed on amion for assistance.

## 2022-09-27 NOTE — Consult Note (Signed)
Renal Service Consult Note [facility]  Calven Sanker 09/27/2022 Maree Krabbe, MD Requesting Physician: Dr. Jacqulyn Bath, R.   Reason for Consult: ESRD pt w/ chest pain/ NSTEMI HPI: The patient is a 57 y.o. year-old w/ PMH as below who presented to ED w/ c/o chest pain. In ED trops were stable initially, then increased significantly and so IV heparin was started and cardiology consulted. Plan is for heart cath today. We are asked to see for esrd.    Pt seen in room. No HD c/o's, except he is wondering why they are going to "put a new fistula in my R arm, when I have one in the L arm that already works". Denies any SOB, leg swelling.  He lives w/ his brother and a roommate. Takes [employer] bus to dialysis. Has PTSD that is sometimes triggered at OP HD w/ all the bells and noises there.    ROS - denies CP, no joint pain, no HA, no blurry vision, no rash, no diarrhea, no nausea/ vomiting, no dysuria, no difficulty voiding   Past Medical History  Past Medical History:  Diagnosis Date   Anemia    Carotid artery occlusion    Chronic combined systolic (congestive) and diastolic (congestive) heart failure (HCC)    a. 06/2014 Echo: EF 50%; b. 07/2017 Echo: EF 55-60%; c. 07/2020 Echo: EF 50-55%; d. 04/2022 Echo: EF 45-50%, basal-mid anterolateral and basal to mid inf HK. Mild conc LVH, GrII DD. NL RV fxn. RVSP 51.42mmHg. Sev dil LA. Mildly dil RA. MIld-mod MR.   Depression    ESRD (end stage renal disease) (HCC)    a. 04/2022 - initiated HD-->MWF   GERD (gastroesophageal reflux disease)    Hypertensive heart disease    Ischemic cardiomyopathy    a. 06/2014 Echo: EF 50%; b. 07/2017 Echo: EF 55-60%; d. 04/2018 MV: EF 47%, moderate inf ischemia; e. 04/2022 Echo: EF 45-50%.   Latent tuberculosis    Peripheral neuropathy    Pneumonia    PTSD (post-traumatic stress disorder)    Seizures (HCC)    27 years ago   Stroke [facility])    a. 04/2022 MRI Brain: small acute infarct - R posterior pons and  subcortical L cerebral hemisphere. Chronic sm vessel dzs.   Tobacco abuse    Type II diabetes mellitus (HCC)    type 2   Past Surgical History  Past Surgical History:  Procedure Laterality Date   AV FISTULA PLACEMENT Left 11/08/2016   Procedure: ARTERIOVENOUS (AV) FISTULA CREATION;  Surgeon: Chuck Hint, MD;  Location: [facility] OR;  Service: Vascular;  Laterality: Left;   AV FISTULA PLACEMENT Left 03/23/2020   Procedure: LEFT BASILIC VEIN FISTULA CREATION;  Surgeon: Chuck Hint, MD;  Location: [facility] OR;  Service: Vascular;  Laterality: Left;   IR FLUORO GUIDE CV LINE RIGHT  05/10/2022   IR US GUIDE VASC ACCESS RIGHT  05/10/2022   NM MYOVIEW LTD  07/2017   "Moderate sized moderate severity" (on cardiology was very small size, small intensity) partially reversible inferoapical//inferoseptal defect.  (Read as intermediate-high risk) -> over read by cardiology as LOW RISK   NM MYOVIEW LTD  09/2019   [facility] Small sized, mild severity reversible defect involving apical lateral & mid anterolateral wall thought to be artifacts - but CRO mild ischemia.  EF 55%. => Read as LOW RISK:   right foot surgery     TOE AMPUTATION     TRANSTHORACIC ECHOCARDIOGRAM  07/2017   EF 60%.,  Moderate LVH.  GR 1 DD.  No R WMA.  Mild RV and moderate RA dilation.   TRANSTHORACIC ECHOCARDIOGRAM  10/03/2019   [facility]: EF 40%, mild MR, mild aortic calcification   Family History  Family History  Problem Relation Age of Onset   CAD Mother    Heart attack Mother    Diabetes type II Father    Stroke Father    CAD Father    Hypertension Sister    CAD Sister    Diabetes type II Brother    Colon cancer Maternal Uncle        2015   Colon polyps Neg Hx    Esophageal cancer Neg Hx    Stomach cancer Neg Hx    Rectal cancer Neg Hx    Social History  reports that he has been smoking cigarettes. He has a 30.00 pack-year smoking history. He quit smokeless tobacco use about 38 years ago.  His  smokeless tobacco use included snuff and chew. He reports that he does not currently use alcohol. He reports that he does not currently use drugs. Allergies  Allergies  Allergen Reactions   Morphine And Related Shortness Of Breath and Other (See Comments)    Hallucination  Tolerates Norco/Vicodin   Home medications Prior to Admission medications   Medication Sig Start Date End Date Taking? Authorizing Provider  acetaminophen (TYLENOL) 500 MG tablet Take 1,000 mg by mouth every 6 (six) hours as needed (for pain/headaches.). Patient not taking: Reported on 05/27/2022    [provider]  aspirin EC 81 MG tablet Take 81 mg by mouth every evening.    [provider]  atorvastatin (LIPITOR) 40 MG tablet Take 1 tablet (40 mg total) by mouth daily. Patient not taking: Reported on 05/27/2022 05/12/22 06/11/22  Hughie Closs, MD  carvedilol (COREG) 25 MG tablet Take 1 tablet (25 mg total) by mouth 2 (two) times daily with a meal. 05/12/22 06/11/22  Hughie Closs, MD  cetirizine (ZYRTEC) 10 MG tablet TAKE 1 TABLET (10 MG TOTAL) BY MOUTH DAILY. 07/04/22   Grayce Sessions, NP  cloNIDine (CATAPRES) 0.1 MG tablet TAKE ONE (1) TABLET BY MOUTH THREE TIMES DAILY. 07/04/22   Grayce Sessions, NP  fluticasone (FLONASE) 50 MCG/ACT nasal spray Place 2 sprays into both nostrils daily. Patient not taking: Reported on 05/27/2022 08/25/20   Grayce Sessions, NP  gabapentin (NEURONTIN) 300 MG capsule Take 1 capsule (300 mg total) by mouth 2 (two) times daily. Patient not taking: Reported on 05/27/2022 04/21/22   Nadara Mustard, MD  guaiFENesin (MUCINEX) 600 MG 12 hr tablet Take 1 tablet (600 mg total) by mouth 2 (two) times daily as needed for to loosen phlegm. Patient not taking: Reported on 05/27/2022 10/01/20   Grayce Sessions, NP  hydrALAZINE (APRESOLINE) 50 MG tablet Take 2 tablets (100 mg total) by mouth 3 (three) times daily. Patient not taking: Reported on 05/27/2022 05/12/22   Hughie Closs, MD  isoniazid (NYDRAZID) 300 MG tablet Take 1 tablet (300 mg total) by mouth daily. Patient not taking: Reported on 05/27/2022 05/12/22   Hughie Closs, MD  isosorbide mononitrate (IMDUR) 60 MG 24 hr tablet Take 1 tablet (60 mg total) by mouth daily. Patient not taking: Reported on 05/27/2022 05/12/22 06/11/22  Hughie Closs, MD  mirtazapine (REMERON) 7.5 MG tablet Take 1 tablet (7.5 mg total) by mouth at bedtime. Patient not taking: Reported on 05/27/2022 05/12/22 06/11/22  Hughie Closs, MD  nitroGLYCERIN (NITROSTAT) 0.4 MG  SL tablet Place 1 tablet (0.4 mg total) under the tongue every 5 (five) minutes as needed for chest pain (CP or SOB). Patient not taking: Reported on 05/27/2022 07/22/17   Albertine Grates, MD  pyridOXINE (VITAMIN B6) 50 MG tablet Take 1 tablet (50 mg total) by mouth daily. Patient not taking: Reported on 05/04/2022 03/29/22   Gardiner Barefoot, MD  traZODone (DESYREL) 100 MG tablet TAKE 1/2 TO 1 TABLET BY MOUTH AT BEDTIME AS NEEDED FOR SLEEP. 06/04/22   Grayce Sessions, NP     Vitals:   09/27/22 0306 09/27/22 0908 09/27/22 1100 09/27/22 1211  BP: (!) 106/51 130/74  (!) 101/55  Pulse: 68 67  70  Resp: 18 18  19   Temp: 98.4 F (36.9 C)   97.9 F (36.6 C)  TempSrc: Oral   Oral  SpO2:      Weight:   79.3 kg   Height:       Exam Gen alert, no distress No rash, cyanosis or gangrene Sclera anicteric, throat clear  No jvd or bruits Chest clear bilat to bases, no rales/ wheezing RRR no MRG Abd soft ntnd no mass or ascites +bs GU normal  MS no joint effusions or deformity Ext no LE or UE edema, no wounds or ulcers Neuro is alert, Ox 3 , nf    RIJ TDC/ L AVF +bruit      Home meds include - asa, lipitor, coreg 25 bid, clonidine 01 tid, neurontin, hydralazine 100 tid, isoniazid, imdur 60 qd, remeron, trazodone, prns/ vits/ supps     OP HD: MWF [place] 4h  350/ 600   79kg  2/3 bath  RIJ TDC/ L AVF not using  Hep 2000 - last HD 5/10, post wt 81.7kg, coming in 5 kg over  and coming off 81-82kg the last 3 sessions   Assessment/ Plan: NSTEMI - chest pain w/ rising trop's in ED last night. For LHC today, getting IV heparin and asa.  ESRD - on HD MWF. Missed HD yesterday. Will plan HD later this evening after heart catheterization.  HTN/ volume - is euvolemic by wts and on exam. CXR clear. Low UF goal tonight. Cont home BP meds as tolerated.  Anemia esrd - Hb 11 here, no esa needs.  MBD ckd - CCa in range, add on phos.  H/o CVA Chronic combined syst/ diast CHF      Rob Garrick Midgley  MD [facility] 09/27/2022, 2:28 PM  Recent Labs  Lab 09/26/22 1038 09/26/22 2214 09/27/22 0256  HGB 11.5* 9.7*  --   ALBUMIN 3.1*  --   --   CALCIUM 8.7*  --  8.1*  CREATININE 13.27* 14.13* 14.79*  K 4.5  --  4.5   Inpatient medications:  aspirin  81 mg Oral Pre-Cath   aspirin EC  81 mg Oral QPM   atorvastatin  40 mg Oral Daily   carvedilol  25 mg Oral BID WC   Chlorhexidine Gluconate Cloth  6 each Topical Daily   cloNIDine  0.1 mg Oral TID   sodium chloride flush  3 mL Intravenous Q12H    sodium chloride     sodium chloride     heparin 1,100 Units/hr (09/27/22 1326)   sodium chloride, acetaminophen, nitroGLYCERIN, ondansetron (ZOFRAN) IV, sodium chloride flush, zolpidem

## 2022-09-27 NOTE — Progress Notes (Signed)
Rounding Note    Patient Name: Cody Webster Date of Encounter: 09/27/2022  [facility] Cardiologist: Bryan Lemma, MD   Subjective   Denies any chest pain overnight. No SOB. Missed dialysis session yesterday  Inpatient Medications    Scheduled Meds:  aspirin EC  81 mg Oral QPM   atorvastatin  40 mg Oral Daily   carvedilol  25 mg Oral BID WC   Chlorhexidine Gluconate Cloth  6 each Topical Daily   cloNIDine  0.1 mg Oral TID   Continuous Infusions:  heparin 1,100 Units/hr (09/27/22 0359)   PRN Meds: acetaminophen, nitroGLYCERIN, ondansetron (ZOFRAN) IV, zolpidem   Vital Signs    Vitals:   09/26/22 1935 09/26/22 2144 09/26/22 2331 09/27/22 0306  BP: 133/71 (!) 132/59 (!) 109/56 (!) 106/51  Pulse: 78  72 68  Resp: 18  18 18   Temp: 98.2 F (36.8 C)  98.5 F (36.9 C) 98.4 F (36.9 C)  TempSrc: Oral  Oral Oral  SpO2: 100%  93%   Weight:      Height:       No intake or output data in the 24 hours ending 09/27/22 0816    09/26/2022   10:52 AM 07/14/2022    8:32 AM 05/12/2022   12:40 PM  Last 3 Weights  Weight (lbs) 175 lb 173 lb 160 lb 11.5 oz  Weight (kg) 79.379 kg 78.472 kg 72.9 kg      Telemetry    NSR without significant ventricular ectopy - Personally Reviewed  ECG    NSR without significant ST-T wave changes - Personally Reviewed  Physical Exam   GEN: No acute distress.   Neck: No JVD Cardiac: RRR, no murmurs, rubs, or gallops.  Respiratory: Clear to auscultation bilaterally. GI: Soft, nontender, non-distended  MS: No edema; No deformity. Neuro:  Nonfocal  Psych: Normal affect   Labs    High Sensitivity Troponin:   Recent Labs  Lab 09/26/22 1038 09/26/22 1221 09/26/22 2214 09/27/22 0257  TROPONINIHS 31* 46* 636* 790*     Chemistry Recent Labs  Lab 09/26/22 1038 09/26/22 2214  NA 137  --   K 4.5  --   CL 98  --   CO2 22  --   GLUCOSE 155*  --   BUN 52*  --   CREATININE 13.27* 14.13*  CALCIUM 8.7*  --   PROT 7.4   --   ALBUMIN 3.1*  --   AST 10*  --   ALT 11  --   ALKPHOS 178*  --   BILITOT 0.6  --   GFRNONAA 4* 4*  ANIONGAP 17*  --     Lipids  Recent Labs  Lab 09/27/22 0257  CHOL 131  TRIG 79  HDL 38*  LDLCALC 77  CHOLHDL 3.4    Hematology Recent Labs  Lab 09/26/22 1038 09/26/22 2214  WBC 6.9 7.3  RBC 3.64* 3.02*  HGB 11.5* 9.7*  HCT 37.0* 30.6*  MCV 101.6* 101.3*  MCH 31.6 32.1  MCHC 31.1 31.7  RDW 14.2 14.2  PLT 144* 149*   Thyroid No results for input(s): "TSH", "FREET4" in the last 168 hours.  BNPNo results for input(s): "BNP", "PROBNP" in the last 168 hours.  DDimer No results for input(s): "DDIMER" in the last 168 hours.   Radiology    DG Chest Port 1 View  Result Date: 09/26/2022 CLINICAL DATA:  Chest pain and shortness of breath this morning. Hemodialysis patient. EXAM: PORTABLE CHEST 1 VIEW COMPARISON:  Radiographs 05/07/2022 and 05/04/2022. FINDINGS: 1128 hours. Lordotic positioning. Right IJ hemodialysis catheter projects to the lower SVC level. The heart size and mediastinal contours are stable. The lungs remain clear. There is no pleural effusion pneumothorax. No acute osseous findings are evident. Telemetry leads overlie the chest. IMPRESSION: No evidence of acute cardiopulmonary process. Right IJ hemodialysis catheter tip projects to the lower SVC level. Electronically Signed   By: Carey Bullocks M.D.   On: 09/26/2022 11:45    Cardiac Studies   Echo 05/05/2022  1. Left ventricular ejection fraction, by estimation, is 45 to 50%. The  left ventricle has mildly decreased function. The left ventricle  demonstrates regional wall motion abnormalities with basal to mid  anterolateral and basal to mid inferior  hypokinesis. There is mild concentric left ventricular hypertrophy. Left  ventricular diastolic parameters are consistent with Grade II diastolic  dysfunction (pseudonormalization).   2. Right ventricular systolic function is normal. The right ventricular   size is mildly enlarged. There is moderately elevated pulmonary artery  systolic pressure. The estimated right ventricular systolic pressure is  51.2 mmHg.   3. Left atrial size was severely dilated.   4. Right atrial size was mildly dilated.   5. The mitral valve is degenerative. Mild to moderate mitral valve  regurgitation. No evidence of mitral stenosis. Moderate mitral annular  calcification.   6. The aortic valve is tricuspid. There is mild calcification of the  aortic valve. Aortic valve regurgitation is not visualized. No aortic  stenosis is present.   7. The inferior vena cava is dilated in size with <50% respiratory  variability, suggesting right atrial pressure of 15 mmHg.   Patient Profile     57 y.o. male with PMH of ESRD on HD 04/2022, HTN, HLD, DM II, presumed ICM, chronic combined systolic/diastolic CHF, lacunar stroke 04/2022, tobacco abuse and PTSD who presented with chest pain and elevated troponin.  Assessment & Plan    NSTEMI  - abnormal myoview in 2016 with inferior ischemia, cath deferred due to CKD  - progressive DOE since Dec and chest pain now occurring at rest.   - Serial trop 31--> 46 --> 636 --> 790  - plan to proceed with left heart catheterization today. Likely will be this afternoon  Risk and benefit of procedure explained to the patient who display clear understanding and agree to proceed.  Discussed with patient possible procedural risk include bleeding, vascular injury, renal injury, arrythmia, MI, stroke and loss of limb or life.  Hypertension: On coreg and clonidine. Used to be imdur/hydralazine, patient reported not taking since January  HLD: on lipitor  DM II  Chronic combined systolic and diastolic CHF: consider D/C clonidine, restart previous Imdur/hydralazine  Lacunar stroke      For questions or updates, please contact [facility] Please consult [email or web address] for contact info under        Signed, Azalee Course, PA   09/27/2022, 8:16 AM

## 2022-09-27 NOTE — Progress Notes (Signed)
ANTICOAGULATION CONSULT NOTE - Follow Up Consult  Pharmacy Consult for heparin Indication:  USAP  Labs: Recent Labs    09/26/22 1038 09/26/22 1221 09/26/22 2214 09/27/22 0257  HGB 11.5*  --  9.7*  --   HCT 37.0*  --  30.6*  --   PLT 144*  --  149*  --   HEPARINUNFRC  --   --   --  0.26*  CREATININE 13.27*  --  14.13*  --   TROPONINIHS 31* 46* 636*  --     Assessment: 57yo male subtherapeutic on heparin with initial dosing for USAP; no infusion issues or signs of bleeding per RN.  Goal of Therapy:  Heparin level 0.3-0.7 units/ml   Plan:  Increase heparin infusion by 2 units/kg/hr to 1100 units/hr. Check level in 8 hours.   Vernard Gambles, PharmD, BCPS 09/27/2022 3:43 AM

## 2022-09-28 ENCOUNTER — Encounter (HOSPITAL_COMMUNITY): Payer: Self-pay | Admitting: Cardiovascular Disease

## 2022-09-28 DIAGNOSIS — Z23 Encounter for immunization: Secondary | ICD-10-CM | POA: Diagnosis not present

## 2022-09-28 DIAGNOSIS — R7881 Bacteremia: Secondary | ICD-10-CM | POA: Diagnosis not present

## 2022-09-28 DIAGNOSIS — Z992 Dependence on renal dialysis: Secondary | ICD-10-CM | POA: Diagnosis not present

## 2022-09-28 DIAGNOSIS — N186 End stage renal disease: Secondary | ICD-10-CM | POA: Diagnosis not present

## 2022-09-28 DIAGNOSIS — D689 Coagulation defect, unspecified: Secondary | ICD-10-CM | POA: Diagnosis not present

## 2022-09-28 LAB — HEPATITIS B SURFACE ANTIBODY, QUANTITATIVE: Hep B S AB Quant (Post): 19.3 m[IU]/mL (ref >9.9)

## 2022-09-28 MED FILL — Nitroglycerin IV Soln 100 MCG/ML in D5W: INTRA_ARTERIAL | Qty: 10 | Status: AC

## 2022-09-28 NOTE — Progress Notes (Signed)
Late Note Entry  Contacted [facility] this morning to advise clinic that pt left hospital AMA early this morning. Clinic advised that pt may or may not resume care today.   Olivia Canter Renal Navigator [phone]

## 2022-09-28 NOTE — Progress Notes (Addendum)
Patient left hospital against medical advise at 0021 on 09/28/2022 with his nephew Lanora Manis.  Julian Reil MD is aware. CCMD notified.

## 2022-09-28 NOTE — Progress Notes (Addendum)
While in dialysis, patient became very hostile and using profanity. Patient refused to continue dialysis and refuse to wear the heart monitor. Patient wants to go home. Julian Reil MD paged

## 2022-09-29 ENCOUNTER — Encounter: Payer: Self-pay | Admitting: Physician Assistant

## 2022-09-29 ENCOUNTER — Ambulatory Visit: Payer: Medicaid Other | Admitting: Physician Assistant

## 2022-09-29 ENCOUNTER — Telehealth (HOSPITAL_COMMUNITY): Payer: Self-pay | Admitting: *Deleted

## 2022-09-29 ENCOUNTER — Telehealth: Payer: Self-pay

## 2022-09-29 VITALS — BP 154/77 | HR 80 | Ht 73.0 in | Wt 174.0 lb

## 2022-09-29 DIAGNOSIS — F17210 Nicotine dependence, cigarettes, uncomplicated: Secondary | ICD-10-CM

## 2022-09-29 DIAGNOSIS — E782 Mixed hyperlipidemia: Secondary | ICD-10-CM

## 2022-09-29 DIAGNOSIS — I214 Non-ST elevation (NSTEMI) myocardial infarction: Secondary | ICD-10-CM | POA: Diagnosis not present

## 2022-09-29 DIAGNOSIS — R051 Acute cough: Secondary | ICD-10-CM | POA: Diagnosis not present

## 2022-09-29 DIAGNOSIS — I10 Essential (primary) hypertension: Secondary | ICD-10-CM

## 2022-09-29 DIAGNOSIS — F17200 Nicotine dependence, unspecified, uncomplicated: Secondary | ICD-10-CM

## 2022-09-29 MED ORDER — NICOTINE 21 MG/24HR TD PT24
21.0000 mg | MEDICATED_PATCH | Freq: Every day | TRANSDERMAL | 0 refills | Status: DC
Start: 2022-09-29 — End: 2023-01-11

## 2022-09-29 MED ORDER — CARVEDILOL 25 MG PO TABS
25.0000 mg | ORAL_TABLET | Freq: Two times a day (BID) | ORAL | 0 refills | Status: DC
Start: 2022-09-29 — End: 2022-10-06

## 2022-09-29 MED ORDER — ATORVASTATIN CALCIUM 40 MG PO TABS
40.0000 mg | ORAL_TABLET | Freq: Every day | ORAL | 0 refills | Status: DC
Start: 2022-09-29 — End: 2022-10-06

## 2022-09-29 MED ORDER — ASPIRIN 81 MG PO TBEC
81.0000 mg | DELAYED_RELEASE_TABLET | Freq: Every day | ORAL | 12 refills | Status: DC
Start: 2022-09-29 — End: 2022-10-06

## 2022-09-29 MED ORDER — BENZONATATE 100 MG PO CAPS
ORAL_CAPSULE | ORAL | 0 refills | Status: DC
Start: 2022-09-29 — End: 2023-01-11

## 2022-09-29 NOTE — Patient Instructions (Signed)
I sent refills of your medications to your pharmacy.  I sent a prescription for nicotine patches, if you are unable to get this through your insurance, please call 1 800 quit now and they will send patches to you.  Please make sure that you keep your appointment with cardiology next week and your appointment with Gwinda Passe in 2 weeks.  Please let us know if there is anything else we can do for you.  Roney Jaffe, PA-C Physician Assistant [facility] [email or web address]   Tobacco Use Disorder Tobacco use disorder (TUD) occurs when a person craves, seeks, and uses tobacco, regardless of the consequences. This disorder can cause problems with mental and physical health. It can affect your ability to have healthy relationships. It can also keep you from meeting your responsibilities at work, home, or school. Tobacco products contain a dangerous chemical called nicotine. Nicotine triggers hormones that make the body feel stimulated and works on areas of the brain that make a person feel good. These effects can make the person depend on nicotine, which makes it hard to quit tobacco. Tobacco may be: Smoked as a cigarette or cigar. Inhaled using vaping devices, such as e-cigarettes. Smoked in a pipe or hookah. Chewed as smokeless tobacco. Inhaled into the nostrils as snuff. What are the causes? This condition is caused by using nicotine. Nicotine causes changes in your brain that make you want more and more. This is called addiction. This can make it hard to stop using tobacco once you start. What are the signs or symptoms? Symptoms of TUD may include: Being unable to stop your tobacco use even after planned quit attempts. Spending an abnormal amount of time getting or using tobacco. Craving tobacco. Tobacco use that: Interferes with your work, school, or home life. Interferes with your personal and social relationships. Makes you  give up activities that you once enjoyed or found important. Using tobacco even though you know that it is: Dangerous or bad for your health or someone else's health. Causing problems in your life. Needing more and more of the substance to get the same effect (developing tolerance). Using the substance to avoid unpleasant symptoms if you do not use the substance (withdrawal). How is this diagnosed? This condition may be diagnosed based on: Your current and past tobacco use. Your health care provider may ask questions about how your tobacco use affects your life. A physical exam. You may be diagnosed with TUD if you have at least two symptoms within a 78-month period. How is this treated? This condition is treated by stopping tobacco use. Many people are unable to quit on their own and need help. Treatment may include: Nicotine replacement therapy (NRT). NRT provides nicotine without the other harmful chemicals in tobacco. NRT gradually lowers the dosage of nicotine in the body and reduces withdrawal symptoms. NRT is available as: Over-the-counter gums, lozenges, and skin patches. Prescription mouth inhalers and nasal sprays. Medicine that acts on the brain to reduce cravings and withdrawal symptoms. A type of talk therapy that examines your triggers for tobacco use, how to avoid them, and how to cope with cravings (behavioral therapy). Hypnosis. This may help with withdrawal symptoms. Joining a support group for people coping with TUD. The best treatment for TUD is usually a combination of medicine, talk therapy, and support groups. Recovery can be a long process. Many people start using tobacco again after stopping (relapse). If you relapse, it does not mean that treatment will not work. Follow  these instructions at home:  Lifestyle Do not use any products that contain nicotine or tobacco. These products include cigarettes, chewing tobacco, and vaping devices, such as e-cigarettes. If you  need help quitting, ask your health care provider. Avoid things that trigger tobacco use as much as you can. Triggers include people and situations that usually cause you to use tobacco. Avoid drinks that contain caffeine, including coffee. These may worsen some withdrawal symptoms. Find ways to manage stress. Wanting to smoke may cause stress, and stress can make you want to smoke. Relaxation techniques such as deep breathing, meditation, and yoga may help. Attend support groups as needed. These groups are an important part of long-term recovery for many people. General instructions Take over-the-counter and prescription medicines only as told by your health care provider. Check with your health care provider before taking any new prescription or over-the-counter medicines. Decide on a friend, family member, or smoking quit-line (such as 1-800-QUIT-NOW in the [place] that you can call or text when you feel the urge to smoke or when you need help coping with cravings. Keep all follow-up visits. This is important. Contact a health care provider if: You are not able to take your medicines as told by your health care provider. Your symptoms get worse, even with treatment. Summary Tobacco use disorder (TUD) occurs when a person craves, seeks, and uses tobacco regardless of the consequences. This condition may be diagnosed based on your current and past tobacco use and a physical exam. Many people are unable to quit on their own and need help. Recovery can be a long process. The most effective treatment for TUD is usually a combination of medicine, talk therapy, and support groups. This information is not intended to replace advice given to you by your health care provider. Make sure you discuss any questions you have with your health care provider. Document Revised: 04/27/2021 Document Reviewed: 04/27/2021 Elsevier Patient Education  2023 [employer].

## 2022-09-29 NOTE — Telephone Encounter (Signed)
CARDIAC REHAB PHASE I   Unable to reach via telephone. Will mail Mi/stent education to address on file. Referral to [facility] for CRP2 has been placed.    Woodroe Chen, RN BSN 09/29/2022 2:37 PM

## 2022-09-29 NOTE — Progress Notes (Signed)
Established Patient Office Visit  Subjective   Patient ID: Cody Webster, male    DOB: 01-06-1966  Age: 57 y.o. MRN: [id number]  Chief Complaint  Patient presents with   Congestive Heart Failure   Follow-up    Medication refill     States that he was hospitalized for an acute non-STEMI heart attack, presents today after leaving AGAINST MEDICAL ADVICE postcardiac cath placement.  States that he left the hospital because he did not feel he was receiving any care.  Wife is present and states that he needs medication refills.  Patient states that he does not want to take these, states that he does not like taking his medicine patient is because he thinks patient worse as it is pointless to take them since they get "washed out when he goes to dialysis".  Is in dialysis Monday Wednesdays and Friday.  Does endorse that he will use the Percocet that is prescribed for pain and requests medication to help him with his cough.  States cough has been present for past few weeks.  Denies any other upper respiratory symptoms.  Also endorses that he will use the trazodone to help him with sleep but only takes it every other day otherwise it "does not work" a refill on that medication.  States that he continues to smoke 1 pack of cigarettes a day, would be willing to try nicotine patch    Past Medical History:  Diagnosis Date   Anemia    Carotid artery occlusion    Chronic combined systolic (congestive) and diastolic (congestive) heart failure (HCC)    a. 06/2014 Echo: EF 50%; b. 07/2017 Echo: EF 55-60%; c. 07/2020 Echo: EF 50-55%; d. 04/2022 Echo: EF 45-50%, basal-mid anterolateral and basal to mid inf HK. Mild conc LVH, GrII DD. NL RV fxn. RVSP 51.22mmHg. Sev dil LA. Mildly dil RA. MIld-mod MR.   Depression    ESRD (end stage renal disease) (HCC)    a. 04/2022 - initiated HD-->MWF   GERD (gastroesophageal reflux disease)    Hypertensive heart disease    Ischemic cardiomyopathy    a. 06/2014 Echo: EF  50%; b. 07/2017 Echo: EF 55-60%; d. 04/2018 MV: EF 47%, moderate inf ischemia; e. 04/2022 Echo: EF 45-50%.   Latent tuberculosis    Peripheral neuropathy    Pneumonia    PTSD (post-traumatic stress disorder)    Seizures (HCC)    27 years ago   Stroke [facility])    a. 04/2022 MRI Brain: small acute infarct - R posterior pons and subcortical L cerebral hemisphere. Chronic sm vessel dzs.   Tobacco abuse    Type II diabetes mellitus (HCC)    type 2   Social History   Socioeconomic History   Marital status: Single    Spouse name: Not on file   Number of children: 1   Years of education: Not on file   Highest education level: Not on file  Occupational History    Comment: Disabled - for Back pain & foot ulcer  Tobacco Use   Smoking status: Every Day    Packs/day: 1.00    Years: 30.00    Additional pack years: 0.00    Total pack years: 30.00    Types: Cigarettes   Smokeless tobacco: Former    Types: Snuff, Chew    Quit date: 12/11/1983  Vaping Use   Vaping Use: Never used  Substance and Sexual Activity   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  Does not routinely exercise.   Social Determinants of Health   Financial Resource Strain: Not on file  Food Insecurity: No Food Insecurity (09/26/2022)   Hunger Vital Sign    Worried About Running Out of Food in the Last Year: Never true    Ran Out of Food in the Last Year: Never true  Transportation Needs: No Transportation Needs (09/26/2022)   PRAPARE - Administrator, Civil Service (Medical): No    Lack of Transportation (Non-Medical): No  Physical Activity: Not on file  Stress: No Stress Concern Present (05/27/2022)   [employer] of Occupational Health - Occupational Stress Questionnaire    Feeling of Stress : Only a little  Social  Connections: Not on file  Intimate Partner Violence: Not At Risk (09/26/2022)   Humiliation, Afraid, Rape, and Kick questionnaire    Fear of Current or Ex-Partner: No    Emotionally Abused: No    Physically Abused: No    Sexually Abused: No   Family History  Problem Relation Age of Onset   CAD Mother    Heart attack Mother    Diabetes type II Father    Stroke Father    CAD Father    Hypertension Sister    CAD Sister    Diabetes type II Brother    Colon cancer Maternal Uncle        2015   Colon polyps Neg Hx    Esophageal cancer Neg Hx    Stomach cancer Neg Hx    Rectal cancer Neg Hx    Allergies  Allergen Reactions   Morphine And Codeine Shortness Of Breath and Other (See Comments)    Hallucination  Tolerates Norco/Vicodin    Review of Systems  Constitutional:  Negative for chills and fever.  HENT:  Negative for sinus pain and sore throat.   Eyes: Negative.   Respiratory:  Positive for cough. Negative for sputum production and shortness of breath.   Cardiovascular:  Negative for chest pain.  Gastrointestinal: Negative.   Genitourinary: Negative.   Musculoskeletal: Negative.   Skin: Negative.   Neurological: Negative.   Endo/Heme/Allergies: Negative.   Psychiatric/Behavioral: Negative.        Objective:     BP (!) 154/77 (BP Location: Left Arm, Patient Position: Sitting, Cuff Size: Large)   Pulse 80   Ht 6\' 1"  (1.854 m)   Wt 174 lb (78.9 kg)   BMI 22.96 kg/m    Physical Exam Vitals and nursing note reviewed.  Constitutional:      Appearance: Normal appearance.     Comments: Using quad cane  HENT:     Head: Normocephalic and atraumatic.     Right Ear: External ear normal.     Left Ear: External ear normal.     Nose: Nose normal.     Mouth/Throat:     Mouth: Mucous membranes are moist.     Pharynx: Oropharynx is clear.  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     Pulses: Normal  pulses.     Heart sounds: Normal heart sounds.  Pulmonary:     Effort: Pulmonary effort is normal.     Breath sounds: Normal breath sounds. No  wheezing.  Musculoskeletal:        General: Normal range of motion.     Cervical back: Normal range of motion and neck supple.  Skin:    General: Skin is warm and dry.  Neurological:     General: No focal deficit present.     Mental Status: He is alert and oriented to person, place, and time.  Psychiatric:        Mood and Affect: Mood normal.        Behavior: Behavior normal.        Thought Content: Thought content normal.        Judgment: Judgment normal.        Assessment & Plan:   Problem List Items Addressed This Visit       Cardiovascular and Mediastinum   Essential hypertension (Chronic)   Relevant Medications   aspirin EC 81 MG tablet   carvedilol (COREG) 25 MG tablet   atorvastatin (LIPITOR) 40 MG tablet   Non-ST elevation (NSTEMI) myocardial infarction (HCC)   Relevant Medications   aspirin EC 81 MG tablet   carvedilol (COREG) 25 MG tablet   atorvastatin (LIPITOR) 40 MG tablet     Other   Tobacco use disorder (Chronic)   Relevant Medications   nicotine (NICOTINE STEP 1) 21 mg/24hr patch   Hyperlipidemia (Chronic)   Relevant Medications   aspirin EC 81 MG tablet   carvedilol (COREG) 25 MG tablet   atorvastatin (LIPITOR) 40 MG tablet   Other Visit Diagnoses     Seasonal allergies    -  Primary   Acute cough       Relevant Medications   benzonatate (TESSALON) 100 MG capsule     1. Essential hypertension Patient has follow-up appointment with cardiology in 1 week.  Refills of medications that patient was being given during his hospitalization.  Patient is adamant that he does not want to take these medications.  Patient education given on importance of compliance.  Red flags given for prompt reevaluation. - aspirin EC 81 MG tablet; Take 1 tablet (81 mg total) by mouth daily. Swallow whole.  Dispense: 30 tablet;  Refill: 12 - carvedilol (COREG) 25 MG tablet; Take 1 tablet (25 mg total) by mouth 2 (two) times daily with a meal.  Dispense: 60 tablet; Refill: 0  2. Non-ST elevation (NSTEMI) myocardial infarction (HCC)   3. Acute cough Trial Tessalon Perles - benzonatate (TESSALON) 100 MG capsule; Take 1-2 caps PO TID PRN  Dispense: 20 capsule; Refill: 0  4. Mixed hyperlipidemia  - atorvastatin (LIPITOR) 40 MG tablet; Take 1 tablet (40 mg total) by mouth daily.  Dispense: 30 tablet; Refill: 0  5. Tobacco use disorder Wife did request patches for patient.  Patient education given on to quit smoking, 1 800 quit NOW information - nicotine (NICOTINE STEP 1) 21 mg/24hr patch; Place 1 patch (21 mg total) onto the skin daily.  Dispense: 28 patch; Refill: 0   I have reviewed the patient's medical history (PMH, PSH, Social History, Family History, Medications, and allergies) , and have been updated if relevant. I spent 30 minutes reviewing chart and  face to face time with patient.     Return if symptoms worsen or fail to improve.    Kasandra Knudsen Mayers, PA-C

## 2022-09-29 NOTE — Transitions of Care (Post Inpatient/ED Visit) (Signed)
09/29/2022  Name: Cody Webster MRN: [id number] DOB: 26-Jan-1966  Today's TOC FU Call Status: Today's TOC FU Call Status:: Successful TOC FU Call Competed TOC FU Call Complete Date: 09/29/22  Transition Care Management Follow-up Telephone Call Date of Discharge: 09/28/22 Discharge Facility: [facility]) Type of Discharge: Inpatient Admission Primary Inpatient Discharge Diagnosis:: NSTEMI - he left AMA How have you been since you were released from the hospital?: Same (He said he is not sure) Any questions or concerns?: Yes Patient Questions/Concerns:: He said he he walked out of the hospital AMA and has no medications. He wanted to see Gwinda Passe, NP as soon as possible.  She has availability tomorrow morning but he said he has dialysis and could not go. I offered an appointment at another clinic and he said has a good relationship with Marcelino Duster and only wanted to see her.  I explained to him that it is important he gets assessed and gets his medications.  I explained to him that he can go to [facility] at [facility] today. He said he has transportation and will go now.  I gave him the address and he said he knows where it is.   [facility] notified that the patient plans to see them today. Patient Questions/Concerns Addressed: Notified Provider of Patient Questions/Concerns  Items Reviewed: Did you receive and understand the discharge instructions provided?: No (He left AMA, has no instructions.) Medications obtained,verified, and reconciled?: No Medications Not Reviewed Reasons:: Other: (He has no medications and plans to see the provider at the [facility] today.) Any new allergies since your discharge?: No Dietary orders reviewed?: No Do you have support at home?: Yes People in Home: sibling(s) Name of Support/Comfort Primary Source: He lives with his brother and a roommate  Medications Reviewed Today: Medications Reviewed Today     Reviewed by  Card, Bryson Ha, CPhT (Pharmacy Technician) on 09/26/22 at 1804  Med List Status: F/U 2nd Shift   Medication Order Taking? Sig Documenting Provider Last Dose Status Informant  acetaminophen (TYLENOL) 500 MG tablet [id number]  Take 1,000 mg by mouth every 6 (six) hours as needed (for pain/headaches.).  Patient not taking: Reported on 05/27/2022   [provider]  Active Self  aspirin EC 81 MG tablet [id number]  Take 81 mg by mouth every evening. [provider]  Active Self  atorvastatin (LIPITOR) 40 MG tablet [id number]  Take 1 tablet (40 mg total) by mouth daily.  Patient not taking: Reported on 05/27/2022   Hughie Closs, MD  Expired 06/11/22 2359   carvedilol (COREG) 25 MG tablet [id number]  Take 1 tablet (25 mg total) by mouth 2 (two) times daily with a meal. Hughie Closs, MD  Expired 06/11/22 2359   cetirizine (ZYRTEC) 10 MG tablet [id number]  TAKE 1 TABLET (10 MG TOTAL) BY MOUTH DAILY. Grayce Sessions, NP  Active   cloNIDine (CATAPRES) 0.1 MG tablet [id number]  TAKE ONE (1) TABLET BY MOUTH THREE TIMES DAILY. Grayce Sessions, NP  Active   fluticasone [facility]) 50 MCG/ACT nasal spray [id number]  Place 2 sprays into both nostrils daily.  Patient not taking: Reported on 05/27/2022   Grayce Sessions, NP  Active Self  gabapentin (NEURONTIN) 300 MG capsule [id number]  Take 1 capsule (300 mg total) by mouth 2 (two) times daily.  Patient not taking: Reported on 05/27/2022   Nadara Mustard, MD  Active Self  guaiFENesin [facility]) 600 MG 12 hr tablet [id number]  Take  1 tablet (600 mg total) by mouth 2 (two) times daily as needed for to loosen phlegm.  Patient not taking: Reported on 05/27/2022   Grayce Sessions, NP  Active Self  hydrALAZINE (APRESOLINE) 50 MG tablet [id number]  Take 2 tablets (100 mg total) by mouth 3 (three) times daily.  Patient not taking: Reported on 05/27/2022   Hughie Closs, MD  Active   isoniazid (NYDRAZID) 300 MG tablet [id number]  Take 1 tablet (300 mg  total) by mouth daily.  Patient not taking: Reported on 05/27/2022   Hughie Closs, MD  Active   isosorbide mononitrate (IMDUR) 60 MG 24 hr tablet [id number]  Take 1 tablet (60 mg total) by mouth daily.  Patient not taking: Reported on 05/27/2022   Hughie Closs, MD  Expired 06/11/22 2359   mirtazapine (REMERON) 7.5 MG tablet [id number]  Take 1 tablet (7.5 mg total) by mouth at bedtime.  Patient not taking: Reported on 05/27/2022   Hughie Closs, MD  Expired 06/11/22 2359   nitroGLYCERIN (NITROSTAT) 0.4 MG SL tablet [id number]  Place 1 tablet (0.4 mg total) under the tongue every 5 (five) minutes as needed for chest pain (CP or SOB).  Patient not taking: Reported on 05/27/2022   Albertine Grates, MD  Active Self           Med Note Judithann Graves, [facility]   Wed May 04, 2022  3:17 PM) Needs New Rx ( out dated Rx)  pyridOXINE (VITAMIN B6) 50 MG tablet [id number]  Take 1 tablet (50 mg total) by mouth daily.  Patient not taking: Reported on 05/04/2022   Gardiner Barefoot, MD  Active Self  traZODone (DESYREL) 100 MG tablet [id number]  TAKE 1/2 TO 1 TABLET BY MOUTH AT BEDTIME AS NEEDED FOR SLEEP. Grayce Sessions, NP  Active             Home Care and Equipment/Supplies: Were Home Health Services Ordered?: No Any new equipment or medical supplies ordered?: No  Functional Questionnaire: Do you need assistance with bathing/showering or dressing?: No Do you need assistance with meal preparation?: No Do you need assistance with eating?: No Do you have difficulty maintaining continence: No Do you need assistance with getting out of bed/getting out of a chair/moving?: Yes (He stated that he uses a cane or walker) Do you have difficulty managing or taking your medications?: Yes (He currently has no meds)  Follow up appointments reviewed: PCP Follow-up appointment confirmed?: Yes Date of PCP follow-up appointment?: 09/29/22 Follow-up Provider: Maurene Capes, PA at [facility].  He will then schedule an appointment with  Efrain Sella, NP Specialist Hospital Follow-up appointment confirmed?: Yes Date of Specialist follow-up appointment?: 10/06/22 Follow-Up Specialty Provider:: cardiology, orthopedics- 10/24/2022. (He saw [facility] pain management - 09/28/2022 and has a follow up - 11/08/2022.  He attends dialysis M/W/F - [facility]) Do you need transportation to your follow-up appointment?: No Do you understand care options if your condition(s) worsen?: Yes-patient verbalized understanding    SIGNATURE  Robyne Peers, RN

## 2022-09-30 DIAGNOSIS — Z992 Dependence on renal dialysis: Secondary | ICD-10-CM | POA: Diagnosis not present

## 2022-09-30 DIAGNOSIS — N186 End stage renal disease: Secondary | ICD-10-CM | POA: Diagnosis not present

## 2022-09-30 DIAGNOSIS — Z23 Encounter for immunization: Secondary | ICD-10-CM | POA: Diagnosis not present

## 2022-09-30 DIAGNOSIS — R7881 Bacteremia: Secondary | ICD-10-CM | POA: Diagnosis not present

## 2022-09-30 DIAGNOSIS — D689 Coagulation defect, unspecified: Secondary | ICD-10-CM | POA: Diagnosis not present

## 2022-10-03 DIAGNOSIS — N2581 Secondary hyperparathyroidism of renal origin: Secondary | ICD-10-CM | POA: Diagnosis not present

## 2022-10-03 DIAGNOSIS — R7881 Bacteremia: Secondary | ICD-10-CM | POA: Diagnosis not present

## 2022-10-03 DIAGNOSIS — D689 Coagulation defect, unspecified: Secondary | ICD-10-CM | POA: Diagnosis not present

## 2022-10-03 DIAGNOSIS — Z992 Dependence on renal dialysis: Secondary | ICD-10-CM | POA: Diagnosis not present

## 2022-10-03 DIAGNOSIS — N186 End stage renal disease: Secondary | ICD-10-CM | POA: Diagnosis not present

## 2022-10-04 NOTE — Progress Notes (Unsigned)
Cardiology Office Note:    Date:  10/06/2022   ID:  Cody Webster, DOB 01-Dec-1965, MRN [id number]  PCP:  Grayce Sessions, NP [facility] Cardiologist: Bryan Lemma, MD   Reason for visit: Follow-up post PCI  History of Present Illness:    Cody Webster is a 57 y.o. male with a hx of end-stage renal disease on dialysis, hypertension, GERD, latent TB who presented to the ED on Sep 26, 2022 for chest pressure & shortness of breath.  LHC Sep 27, 2022 showed severe ostial/proximal RCA stenosis of 90% treated with DES.  Moderate disease in mid RCA, mid LAD and mid left circumflex -treated medically.  To note patient was hospitalized in December 2023 with unstable angina and elevated troponin.  Echo showed EF of 45% slightly reduced from previous.  Left heart catheterization was recommended but deferred secondary to acute stroke.  Outpatient ischemic evaluation recommended but patient did not come to follow-up appointment.  Today, patient seems angry with care at [facility].  He left the hospital AMA.  He felt like service was too slow.  Today he complains of lack of energy and shortness of breath with exertion.  He denies recurrent chest pain.  He states compliance with dialysis.  He states he is not taking many medications because his blood pressure runs low.  He states he is not taking carvedilol, clonidine, hydralazine, isosorbide mononitrate.  He is currently not taking an antiplatelet like Plavix.  On aspirin 81 mg daily only.  He deferred cardiac rehab referral because he thinks it be difficult to fit into the schedule with dialysis.  He plans to get a bike and start exercising regularly at home.    Past Medical History:  Diagnosis Date   Anemia    Carotid artery occlusion    Chronic combined systolic (congestive) and diastolic (congestive) heart failure (HCC)    a. 06/2014 Echo: EF 50%; b. 07/2017 Echo: EF 55-60%; c. 07/2020 Echo: EF 50-55%; d. 04/2022 Echo: EF 45-50%, basal-mid  anterolateral and basal to mid inf HK. Mild conc LVH, GrII DD. NL RV fxn. RVSP 51.32mmHg. Sev dil LA. Mildly dil RA. MIld-mod MR.   Depression    ESRD (end stage renal disease) (HCC)    a. 04/2022 - initiated HD-->MWF   GERD (gastroesophageal reflux disease)    Hypertensive heart disease    Ischemic cardiomyopathy    a. 06/2014 Echo: EF 50%; b. 07/2017 Echo: EF 55-60%; d. 04/2018 MV: EF 47%, moderate inf ischemia; e. 04/2022 Echo: EF 45-50%.   Latent tuberculosis    Peripheral neuropathy    Pneumonia    PTSD (post-traumatic stress disorder)    Seizures (HCC)    27 years ago   Stroke [facility])    a. 04/2022 MRI Brain: small acute infarct - R posterior pons and subcortical L cerebral hemisphere. Chronic sm vessel dzs.   Tobacco abuse    Type II diabetes mellitus (HCC)    type 2    Past Surgical History:  Procedure Laterality Date   AV FISTULA PLACEMENT Left 11/08/2016   Procedure: ARTERIOVENOUS (AV) FISTULA CREATION;  Surgeon: Chuck Hint, MD;  Location: [facility] OR;  Service: Vascular;  Laterality: Left;   AV FISTULA PLACEMENT Left 03/23/2020   Procedure: LEFT BASILIC VEIN FISTULA CREATION;  Surgeon: Chuck Hint, MD;  Location: [facility] OR;  Service: Vascular;  Laterality: Left;   CORONARY LITHOTRIPSY N/A 09/27/2022   Procedure: CORONARY LITHOTRIPSY;  Surgeon: [name], MD;  Location: [facility] INVASIVE  CV LAB;  Service: Cardiovascular;  Laterality: N/A;   CORONARY STENT INTERVENTION N/A 09/27/2022   Procedure: CORONARY STENT INTERVENTION;  Surgeon: [name], MD;  Location: [facility] INVASIVE CV LAB;  Service: Cardiovascular;  Laterality: N/A;   IR FLUORO GUIDE CV LINE RIGHT  05/10/2022   IR US GUIDE VASC ACCESS RIGHT  05/10/2022   LEFT HEART CATH AND CORONARY ANGIOGRAPHY N/A 09/27/2022   Procedure: LEFT HEART CATH AND CORONARY ANGIOGRAPHY;  Surgeon: [name], MD;  Location: [facility] INVASIVE CV LAB;  Service: Cardiovascular;  Laterality: N/A;   NM MYOVIEW LTD  07/2017   "Moderate  sized moderate severity" (on cardiology was very small size, small intensity) partially reversible inferoapical//inferoseptal defect.  (Read as intermediate-high risk) -> over read by cardiology as LOW RISK   NM MYOVIEW LTD  09/2019   [facility] Small sized, mild severity reversible defect involving apical lateral & mid anterolateral wall thought to be artifacts - but CRO mild ischemia.  EF 55%. => Read as LOW RISK:   right foot surgery     TOE AMPUTATION     TRANSTHORACIC ECHOCARDIOGRAM  07/2017   EF 60%.,  Moderate LVH.  GR 1 DD.  No R WMA.  Mild RV and moderate RA dilation.   TRANSTHORACIC ECHOCARDIOGRAM  10/03/2019   [facility]: EF 40%, mild MR, mild aortic calcification    Current Medications: Current Meds  Medication Sig   acetaminophen (TYLENOL) 500 MG tablet Take 1,000 mg by mouth every 6 (six) hours as needed (for pain/headaches.).   atorvastatin (LIPITOR) 80 MG tablet Take 1 tablet (80 mg total) by mouth daily.   benzonatate (TESSALON) 100 MG capsule Take 1-2 caps PO TID PRN   cetirizine (ZYRTEC) 10 MG tablet TAKE 1 TABLET (10 MG TOTAL) BY MOUTH DAILY.   clopidogrel (PLAVIX) 75 MG tablet Take 1 tablet (75 mg total) by mouth daily.   fluticasone (FLONASE) 50 MCG/ACT nasal spray Place 2 sprays into both nostrils daily.   gabapentin (NEURONTIN) 300 MG capsule Take 1 capsule (300 mg total) by mouth 2 (two) times daily.   guaiFENesin (MUCINEX) 600 MG 12 hr tablet Take 1 tablet (600 mg total) by mouth 2 (two) times daily as needed for to loosen phlegm.   isoniazid (NYDRAZID) 300 MG tablet Take 1 tablet (300 mg total) by mouth daily.   isosorbide mononitrate (IMDUR) 30 MG 24 hr tablet Take 1 tablet (30 mg total) by mouth daily.   metoprolol succinate (TOPROL XL) 25 MG 24 hr tablet Take 1 tablet (25 mg total) by mouth daily.   nicotine (NICOTINE STEP 1) 21 mg/24hr patch Place 1 patch (21 mg total) onto the skin daily.   nitroGLYCERIN (NITROSTAT) 0.4 MG SL tablet Place 1 tablet (0.4  mg total) under the tongue every 5 (five) minutes as needed for chest pain (CP or SOB).   pyridOXINE (VITAMIN B6) 50 MG tablet Take 1 tablet (50 mg total) by mouth daily.   traZODone (DESYREL) 100 MG tablet TAKE 1/2 TO 1 TABLET BY MOUTH AT BEDTIME AS NEEDED FOR SLEEP.   [DISCONTINUED] aspirin EC 81 MG tablet Take 1 tablet (81 mg total) by mouth daily. Swallow whole.   [DISCONTINUED] atorvastatin (LIPITOR) 40 MG tablet Take 1 tablet (40 mg total) by mouth daily.     Allergies:   Morphine and codeine   Social History   Socioeconomic History   Marital status: Single    Spouse name: Not on file   Number of children: 1  Years of education: Not on file   Highest education level: Not on file  Occupational History    Comment: Disabled - for Back pain & foot ulcer  Tobacco Use   Smoking status: Every Day    Packs/day: 1.00    Years: 30.00    Additional pack years: 0.00    Total pack years: 30.00    Types: Cigarettes   Smokeless tobacco: Former    Types: Snuff, Chew    Quit date: 12/11/1983  Vaping Use   Vaping Use: Never used  Substance and Sexual Activity   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  Does not routinely exercise.   Social Determinants of Health   Financial Resource Strain: Not on file  Food Insecurity: No Food Insecurity (09/26/2022)   Hunger Vital Sign    Worried About Running Out of Food in the Last Year: Never true    Ran Out of Food in the Last Year: Never true  Transportation Needs: No Transportation Needs (09/26/2022)   PRAPARE - Administrator, Civil Service (Medical): No    Lack of Transportation (Non-Medical): No  Physical Activity: Not on file  Stress: No Stress Concern Present (05/27/2022)   [employer] of Occupational Health - Occupational Stress  Questionnaire    Feeling of Stress : Only a little  Social Connections: Not on file     Family History: The patient's family history includes CAD in his father, mother, and sister; Colon cancer in his maternal uncle; Diabetes type II in his brother and father; Heart attack in his mother; Hypertension in his sister; Stroke in his father. There is no history of Colon polyps, Esophageal cancer, Stomach cancer, or Rectal cancer.  ROS:   Please see the history of present illness.     EKGs/Labs/Other Studies Reviewed:    EKG:  The ekg ordered today demonstrates normal sinus rhythm, heart rate 91.  Recent Labs: 05/04/2022: B Natriuretic Peptide 2,226.4 09/26/2022: ALT 11 09/27/2022: BUN 65; Creatinine, Ser 15.22; Hemoglobin 10.7; Platelets 162; Potassium 4.5; Sodium 138   Recent Lipid Panel Lab Results  Component Value Date/Time   CHOL 131 09/27/2022 02:57 AM   CHOL 179 04/07/2021 11:34 AM   TRIG 79 09/27/2022 02:57 AM   HDL 38 (L) 09/27/2022 02:57 AM   HDL 48 04/07/2021 11:34 AM   LDLCALC 77 09/27/2022 02:57 AM   LDLCALC 117 (H) 04/07/2021 11:34 AM    Physical Exam:    VS:  BP 110/72   Pulse 91   Ht 6\' 1"  (1.854 m)   Wt 175 lb 12.8 oz (79.7 kg)   SpO2 98%   BMI 23.19 kg/m    No data found.   Wt Readings from Last 3 Encounters:  10/06/22 175 lb 12.8 oz (79.7 kg)  09/29/22 174 lb (78.9 kg)  09/27/22 186 lb 11.7 oz (84.7 kg)     GEN:  Well nourished, well developed in no acute distress NECK: No JVD; No carotid bruits CARDIAC: RRR RESPIRATORY:  Clear to auscultation without rales, wheezing or rhonchi  ABDOMEN: Soft, non-tender, non-distended MUSCULOSKELETAL: No edema SKIN: Warm and dry NEUROLOGIC:  Alert and oriented    ASSESSMENT AND PLAN   Coronary artery disease, no chest pain -LHC in May 2024 with 90%  ostial/prox RCA disease reduced to 0% with Synergy DES.  Moderate CAD in mid RCA, mid LAD and mid left circumflex -treated medically. -DAPT with aspirin & Plavix x  12 months without interruption. -Load Plavix 300 mg, start Plavix 75 mg daily thereafter. -Restart beta-blocker -Toprol XL 25 mg once daily. -Prescribe high intensity statin -Lipitor 80 mg daily. -For residual symptoms, start Imdur 30 mg daily. -Given handout on healthy lifestyle.  Advised if he changes mind about cardiac rehab, we can send a referral at his follow-up appointment in 3 months. -His PCP is working on tobacco cessation with him -she has prescribed nicotine patch.  Ischemic cardiomyopathy, euvolemic Mitral regurgitation -Echo December 2023 with EF 45 to 50%, mild LVH, grade 2 diastolic dysfunction, mild to moderate MR -Volume management per HD.  Hypertension, well-controlled -Meds as above.  Hyperlipidemia with goal LDL less than 70 -LDL 77 in May 2024.  Prescribed Lipitor 80 mg daily. -Discussed cholesterol lowering diets - Mediterranean diet, DASH diet, vegetarian diet, low-carbohydrate diet and avoidance of trans fats.  Discussed healthier choice substitutes.  Nuts, high-fiber foods, and fiber supplements may also improve lipids.    Tobacco use  -Recommend tobacco cessation.  Reviewed physiologic effects of nicotine and the immediate-eventual benefits of quitting including improvement in cough/breathing and reduction in cardiovascular events.  Discussed quitting tips such as removing triggers and getting support from family/friends and [employer]uit[facility] [place].  Disposition - Follow-up in 3 months.   Medication Adjustments/Labs and Tests Ordered: Current medicines are reviewed at length with the patient today.  Concerns regarding medicines are outlined above.  Orders Placed This Encounter  Procedures   EKG 12-Lead   Meds ordered this encounter  Medications   isosorbide mononitrate (IMDUR) 30 MG 24 hr tablet    Sig: Take 1 tablet (30 mg total) by mouth daily.    Dispense:  90 tablet    Refill:  3   metoprolol succinate (TOPROL XL) 25 MG 24 hr tablet    Sig: Take 1 tablet (25  mg total) by mouth daily.    Dispense:  90 tablet    Refill:  3   clopidogrel (PLAVIX) 75 MG tablet    Sig: Take 1 tablet (75 mg total) by mouth daily.    Dispense:  90 tablet    Refill:  3   aspirin EC 81 MG tablet    Sig: Take 1 tablet (81 mg total) by mouth daily. Swallow whole.    Dispense:  30 tablet    Refill:  12   atorvastatin (LIPITOR) 80 MG tablet    Sig: Take 1 tablet (80 mg total) by mouth daily.    Dispense:  90 tablet    Refill:  3    Patient Instructions  Medication Instructions:  Start Plavix 75 mg ( Take 300 mg (4)  75 mg Tablets on 10/06/22). On 10/07/22 Take 1 Tablet Daily . Start Toprol XL 25 mg ( Take 1 Tablet Daily). Decrease Imdur from 60 mg to 30 mg ( Take 1 Tablet Daily). Increase Lipitor from 40 mg to 80 mg ( Take 1  Tablet Daily). Stop Coreg  *If you need a refill on your cardiac medications before your next appointment, please call your pharmacy*   Lab Work: No Labs If you have labs (blood work) drawn today and your tests are completely normal, you will receive your results only by: MyChart Message (if you have MyChart) OR A paper copy in the mail If you have any lab test  that is abnormal or we need to change your treatment, we will call you to review the results.   Testing/Procedures: No Testing   Follow-Up: At [facility], you and your health needs are our priority.  As part of our continuing mission to provide you with exceptional heart care, we have created designated Provider Care Teams.  These Care Teams include your primary Cardiologist (physician) and Advanced Practice Providers (APPs -  Physician Assistants and Nurse Practitioners) who all work together to provide you with the care you need, when you need it.  We recommend signing up for the patient portal called "MyChart".  Sign up information is provided on this After Visit Summary.  MyChart is used to connect with patients for Virtual Visits (Telemedicine).  Patients are able  to view lab/test results, encounter notes, upcoming appointments, etc.  Non-urgent messages can be sent to your provider as well.   To learn more about what you can do with MyChart, go to [email or web address].    Your next appointment:   3 month(s)  Provider:   Juanda Crumble, PA-C    or Bryan Lemma, MD    Signed, Cannon Kettle, PA-C  10/06/2022 10:11 AM    [facility]

## 2022-10-05 ENCOUNTER — Telehealth (HOSPITAL_COMMUNITY): Payer: Self-pay

## 2022-10-05 DIAGNOSIS — N186 End stage renal disease: Secondary | ICD-10-CM | POA: Diagnosis not present

## 2022-10-05 DIAGNOSIS — D689 Coagulation defect, unspecified: Secondary | ICD-10-CM | POA: Diagnosis not present

## 2022-10-05 DIAGNOSIS — R7881 Bacteremia: Secondary | ICD-10-CM | POA: Diagnosis not present

## 2022-10-05 DIAGNOSIS — Z992 Dependence on renal dialysis: Secondary | ICD-10-CM | POA: Diagnosis not present

## 2022-10-05 DIAGNOSIS — N2581 Secondary hyperparathyroidism of renal origin: Secondary | ICD-10-CM | POA: Diagnosis not present

## 2022-10-05 NOTE — Telephone Encounter (Signed)
Pt is not interested in the cardiac rehab program. Closed referral 

## 2022-10-06 ENCOUNTER — Ambulatory Visit: Payer: Medicaid Other | Attending: Physician Assistant | Admitting: Physician Assistant

## 2022-10-06 VITALS — BP 110/72 | HR 91 | Ht 73.0 in | Wt 175.8 lb

## 2022-10-06 DIAGNOSIS — I2510 Atherosclerotic heart disease of native coronary artery without angina pectoris: Secondary | ICD-10-CM

## 2022-10-06 DIAGNOSIS — E782 Mixed hyperlipidemia: Secondary | ICD-10-CM

## 2022-10-06 DIAGNOSIS — I340 Nonrheumatic mitral (valve) insufficiency: Secondary | ICD-10-CM

## 2022-10-06 DIAGNOSIS — I10 Essential (primary) hypertension: Secondary | ICD-10-CM | POA: Diagnosis not present

## 2022-10-06 DIAGNOSIS — I255 Ischemic cardiomyopathy: Secondary | ICD-10-CM | POA: Diagnosis not present

## 2022-10-06 MED ORDER — ASPIRIN 81 MG PO TBEC
81.0000 mg | DELAYED_RELEASE_TABLET | Freq: Every day | ORAL | 12 refills | Status: AC
Start: 2022-10-06 — End: ?

## 2022-10-06 MED ORDER — ATORVASTATIN CALCIUM 80 MG PO TABS: 80.0000 mg | ORAL_TABLET | Freq: Every day | ORAL | 3 refills | Status: DC

## 2022-10-06 MED ORDER — CLOPIDOGREL BISULFATE 75 MG PO TABS: 75.0000 mg | ORAL_TABLET | Freq: Every day | ORAL | 3 refills | Status: DC

## 2022-10-06 MED ORDER — METOPROLOL SUCCINATE ER 25 MG PO TB24: 25.0000 mg | ORAL_TABLET | Freq: Every day | ORAL | 3 refills | Status: DC

## 2022-10-06 MED ORDER — ISOSORBIDE MONONITRATE ER 30 MG PO TB24: 30.0000 mg | ORAL_TABLET | Freq: Every day | ORAL | 3 refills | Status: AC

## 2022-10-06 NOTE — Patient Instructions (Signed)
Medication Instructions:  Start Plavix 75 mg ( Take 300 mg (4)  75 mg Tablets on 10/06/22). On 10/07/22 Take 1 Tablet Daily . Start Toprol XL 25 mg ( Take 1 Tablet Daily). Decrease Imdur from 60 mg to 30 mg ( Take 1 Tablet Daily). Increase Lipitor from 40 mg to 80 mg ( Take 1  Tablet Daily). Stop Coreg  *If you need a refill on your cardiac medications before your next appointment, please call your pharmacy*   Lab Work: No Labs If you have labs (blood work) drawn today and your tests are completely normal, you will receive your results only by: MyChart Message (if you have MyChart) OR A paper copy in the mail If you have any lab test that is abnormal or we need to change your treatment, we will call you to review the results.   Testing/Procedures: No Testing   Follow-Up: At [facility], you and your health needs are our priority.  As part of our continuing mission to provide you with exceptional heart care, we have created designated Provider Care Teams.  These Care Teams include your primary Cardiologist (physician) and Advanced Practice Providers (APPs -  Physician Assistants and Nurse Practitioners) who all work together to provide you with the care you need, when you need it.  We recommend signing up for the patient portal called "MyChart".  Sign up information is provided on this After Visit Summary.  MyChart is used to connect with patients for Virtual Visits (Telemedicine).  Patients are able to view lab/test results, encounter notes, upcoming appointments, etc.  Non-urgent messages can be sent to your provider as well.   To learn more about what you can do with MyChart, go to [email or web address].    Your next appointment:   3 month(s)  Provider:   Juanda Crumble, PA-C    or Bryan Lemma, MD

## 2022-10-07 DIAGNOSIS — N186 End stage renal disease: Secondary | ICD-10-CM | POA: Diagnosis not present

## 2022-10-07 DIAGNOSIS — N2581 Secondary hyperparathyroidism of renal origin: Secondary | ICD-10-CM | POA: Diagnosis not present

## 2022-10-07 DIAGNOSIS — D689 Coagulation defect, unspecified: Secondary | ICD-10-CM | POA: Diagnosis not present

## 2022-10-07 DIAGNOSIS — Z992 Dependence on renal dialysis: Secondary | ICD-10-CM | POA: Diagnosis not present

## 2022-10-07 DIAGNOSIS — R7881 Bacteremia: Secondary | ICD-10-CM | POA: Diagnosis not present

## 2022-10-11 ENCOUNTER — Ambulatory Visit (INDEPENDENT_AMBULATORY_CARE_PROVIDER_SITE_OTHER): Payer: Medicaid Other | Admitting: Primary Care

## 2022-10-14 DIAGNOSIS — I129 Hypertensive chronic kidney disease with stage 1 through stage 4 chronic kidney disease, or unspecified chronic kidney disease: Secondary | ICD-10-CM | POA: Diagnosis not present

## 2022-10-14 DIAGNOSIS — Z992 Dependence on renal dialysis: Secondary | ICD-10-CM | POA: Diagnosis not present

## 2022-10-14 DIAGNOSIS — N186 End stage renal disease: Secondary | ICD-10-CM | POA: Diagnosis not present

## 2022-10-21 ENCOUNTER — Encounter (HOSPITAL_COMMUNITY): Payer: Self-pay

## 2022-10-21 ENCOUNTER — Emergency Department (HOSPITAL_COMMUNITY)
Admission: EM | Admit: 2022-10-21 | Discharge: 2022-10-21 | Disposition: A | Discharge status: Home / Self Care | Payer: Medicaid Other | Attending: Emergency Medicine | Admitting: Emergency Medicine

## 2022-10-21 ENCOUNTER — Emergency Department (HOSPITAL_COMMUNITY): Payer: Medicaid Other

## 2022-10-21 ENCOUNTER — Other Ambulatory Visit: Payer: Self-pay

## 2022-10-21 DIAGNOSIS — Z955 Presence of coronary angioplasty implant and graft: Secondary | ICD-10-CM | POA: Insufficient documentation

## 2022-10-21 DIAGNOSIS — E1140 Type 2 diabetes mellitus with diabetic neuropathy, unspecified: Secondary | ICD-10-CM | POA: Diagnosis not present

## 2022-10-21 DIAGNOSIS — F17200 Nicotine dependence, unspecified, uncomplicated: Secondary | ICD-10-CM | POA: Insufficient documentation

## 2022-10-21 DIAGNOSIS — Z79899 Other long term (current) drug therapy: Secondary | ICD-10-CM | POA: Diagnosis not present

## 2022-10-21 DIAGNOSIS — Z992 Dependence on renal dialysis: Secondary | ICD-10-CM | POA: Insufficient documentation

## 2022-10-21 DIAGNOSIS — Z7984 Long term (current) use of oral hypoglycemic drugs: Secondary | ICD-10-CM | POA: Diagnosis not present

## 2022-10-21 DIAGNOSIS — I5042 Chronic combined systolic (congestive) and diastolic (congestive) heart failure: Secondary | ICD-10-CM | POA: Diagnosis not present

## 2022-10-21 DIAGNOSIS — N186 End stage renal disease: Secondary | ICD-10-CM | POA: Diagnosis not present

## 2022-10-21 DIAGNOSIS — E1122 Type 2 diabetes mellitus with diabetic chronic kidney disease: Secondary | ICD-10-CM | POA: Diagnosis not present

## 2022-10-21 DIAGNOSIS — Z7902 Long term (current) use of antithrombotics/antiplatelets: Secondary | ICD-10-CM | POA: Insufficient documentation

## 2022-10-21 DIAGNOSIS — R109 Unspecified abdominal pain: Secondary | ICD-10-CM

## 2022-10-21 DIAGNOSIS — Z7982 Long term (current) use of aspirin: Secondary | ICD-10-CM | POA: Insufficient documentation

## 2022-10-21 DIAGNOSIS — I132 Hypertensive heart and chronic kidney disease with heart failure and with stage 5 chronic kidney disease, or end stage renal disease: Secondary | ICD-10-CM | POA: Diagnosis not present

## 2022-10-21 DIAGNOSIS — M545 Low back pain, unspecified: Secondary | ICD-10-CM | POA: Diagnosis not present

## 2022-10-21 DIAGNOSIS — M5459 Other low back pain: Secondary | ICD-10-CM | POA: Diagnosis not present

## 2022-10-21 LAB — BASIC METABOLIC PANEL
Anion gap: 13 (ref 5–15)
BUN: 23 mg/dL — ABNORMAL HIGH (ref 6–20)
CO2: 26 mmol/L (ref 22–32)
Calcium: 8.6 mg/dL — ABNORMAL LOW (ref 8.9–10.3)
Chloride: 98 mmol/L (ref 98–111)
Creatinine, Ser: 8.37 mg/dL — ABNORMAL HIGH (ref 0.61–1.24)
GFR, Estimated: 7 mL/min — ABNORMAL LOW (ref >60)
Glucose, Bld: 85 mg/dL (ref 70–99)
Potassium: 3.7 mmol/L (ref 3.5–5.1)
Sodium: 137 mmol/L (ref 135–145)

## 2022-10-21 LAB — CBC WITH DIFFERENTIAL/PLATELET
Abs Immature Granulocytes: 0.03 10*3/uL (ref 0.00–0.07)
Basophils Absolute: 0 10*3/uL (ref 0.0–0.1)
Basophils Relative: 0 %
Eosinophils Absolute: 0.2 10*3/uL (ref 0.0–0.5)
Eosinophils Relative: 3 %
HCT: 34.7 % — ABNORMAL LOW (ref 39.0–52.0)
Hemoglobin: 10.9 g/dL — ABNORMAL LOW (ref 13.0–17.0)
Immature Granulocytes: 0 %
Lymphocytes Relative: 16 %
Lymphs Abs: 1.3 10*3/uL (ref 0.7–4.0)
MCH: 31.8 pg (ref 26.0–34.0)
MCHC: 31.4 g/dL (ref 30.0–36.0)
MCV: 101.2 fL — ABNORMAL HIGH (ref 80.0–100.0)
Monocytes Absolute: 0.9 10*3/uL (ref 0.1–1.0)
Monocytes Relative: 11 %
Neutro Abs: 5.5 10*3/uL (ref 1.7–7.7)
Neutrophils Relative %: 70 %
Platelets: 256 10*3/uL (ref 150–400)
RBC: 3.43 MIL/uL — ABNORMAL LOW (ref 4.22–5.81)
RDW: 14.8 % (ref 11.5–15.5)
WBC: 7.9 10*3/uL (ref 4.0–10.5)
nRBC: 0 % (ref 0.0–0.2)

## 2022-10-21 MED ORDER — HYDROMORPHONE HCL 1 MG/ML IJ SOLN
1.0000 mg | Freq: Once | INTRAMUSCULAR | Status: AC
  Administered 2022-10-21: 1 mg via INTRAVENOUS
  Filled 2022-10-21: qty 1

## 2022-10-21 MED ORDER — OXYCODONE-ACETAMINOPHEN 5-325 MG PO TABS: 1.0000 | ORAL_TABLET | Freq: Four times a day (QID) | ORAL | 0 refills | Status: DC | PRN

## 2022-10-21 NOTE — ED Triage Notes (Signed)
Pt bib ems from [facility]; pt woke 0700 , began experiencing L flank pain; no hx kidney stones; sharp, stabbing pain radiating to pelvis; dialysis under 1 hour, pulled off 0.3; no n/v; took 2 tylenol prior to ems arrival; 104/58, P 98, 99% RA, T 98.75F; hx DM, refusing cbg check

## 2022-10-21 NOTE — ED Notes (Signed)
Patient transported to CT 

## 2022-10-21 NOTE — ED Provider Notes (Signed)
[facility] AT [facility] Provider Note   CSN: [id number] Arrival date & time: 10/21/22  1324     History  No chief complaint on file.   Cody Webster is a 57 y.o. male with a past medical history of ESRD on hemodialysis Monday Wednesday Friday, hypertension, GERD, latent TB presented to the emergency department for evaluation of flank pain.  Patient reports acute onset of left-sided flank pain this morning.  Pain is sharp, constant, nonradiating.  Patient completed his dialysis session this morning.  Denies fever, nausea, vomiting, shortness of breath.  Patient still making urine but denies any urinary symptoms.  Denies any blood in his stool or urine.  No history of kidney stone.  HPI    Past Medical History:  Diagnosis Date   Anemia    Carotid artery occlusion    Chronic combined systolic (congestive) and diastolic (congestive) heart failure (HCC)    a. 06/2014 Echo: EF 50%; b. 07/2017 Echo: EF 55-60%; c. 07/2020 Echo: EF 50-55%; d. 04/2022 Echo: EF 45-50%, basal-mid anterolateral and basal to mid inf HK. Mild conc LVH, GrII DD. NL RV fxn. RVSP 51.23mmHg. Sev dil LA. Mildly dil RA. MIld-mod MR.   Depression    ESRD (end stage renal disease) (HCC)    a. 04/2022 - initiated HD-->MWF   GERD (gastroesophageal reflux disease)    Hypertensive heart disease    Ischemic cardiomyopathy    a. 06/2014 Echo: EF 50%; b. 07/2017 Echo: EF 55-60%; d. 04/2018 MV: EF 47%, moderate inf ischemia; e. 04/2022 Echo: EF 45-50%.   Latent tuberculosis    Peripheral neuropathy    Pneumonia    PTSD (post-traumatic stress disorder)    Seizures (HCC)    27 years ago   Stroke [facility])    a. 04/2022 MRI Brain: small acute infarct - R posterior pons and subcortical L cerebral hemisphere. Chronic sm vessel dzs.   Tobacco abuse    Type II diabetes mellitus (HCC)    type 2   Past Surgical History:  Procedure Laterality Date   AV FISTULA PLACEMENT Left 11/08/2016   Procedure:  ARTERIOVENOUS (AV) FISTULA CREATION;  Surgeon: Chuck Hint, MD;  Location: [facility] OR;  Service: Vascular;  Laterality: Left;   AV FISTULA PLACEMENT Left 03/23/2020   Procedure: LEFT BASILIC VEIN FISTULA CREATION;  Surgeon: Chuck Hint, MD;  Location: [facility] OR;  Service: Vascular;  Laterality: Left;   CORONARY LITHOTRIPSY N/A 09/27/2022   Procedure: CORONARY LITHOTRIPSY;  Surgeon: [name], MD;  Location: [facility] INVASIVE CV LAB;  Service: Cardiovascular;  Laterality: N/A;   CORONARY STENT INTERVENTION N/A 09/27/2022   Procedure: CORONARY STENT INTERVENTION;  Surgeon: [name], MD;  Location: [facility] INVASIVE CV LAB;  Service: Cardiovascular;  Laterality: N/A;   IR FLUORO GUIDE CV LINE RIGHT  05/10/2022   IR US GUIDE VASC ACCESS RIGHT  05/10/2022   LEFT HEART CATH AND CORONARY ANGIOGRAPHY N/A 09/27/2022   Procedure: LEFT HEART CATH AND CORONARY ANGIOGRAPHY;  Surgeon: [name], MD;  Location: [facility] INVASIVE CV LAB;  Service: Cardiovascular;  Laterality: N/A;   NM MYOVIEW LTD  07/2017   "Moderate sized moderate severity" (on cardiology was very small size, small intensity) partially reversible inferoapical//inferoseptal defect.  (Read as intermediate-high risk) -> over read by cardiology as LOW RISK   NM MYOVIEW LTD  09/2019   [facility] Small sized, mild severity reversible defect involving apical lateral & mid anterolateral wall thought to be artifacts - but CRO mild  ischemia.  EF 55%. => Read as LOW RISK:   right foot surgery     TOE AMPUTATION     TRANSTHORACIC ECHOCARDIOGRAM  07/2017   EF 60%.,  Moderate LVH.  GR 1 DD.  No R WMA.  Mild RV and moderate RA dilation.   TRANSTHORACIC ECHOCARDIOGRAM  10/03/2019   [facility]: EF 40%, mild MR, mild aortic calcification     Home Medications Prior to Admission medications   Medication Sig Start Date End Date Taking? Authorizing Provider  oxyCODONE-acetaminophen (PERCOCET/ROXICET) 5-325 MG tablet Take 1 tablet by mouth  every 6 (six) hours as needed for severe pain. 10/21/22  Yes Jeanelle Malling, PA  acetaminophen (TYLENOL) 500 MG tablet Take 1,000 mg by mouth every 6 (six) hours as needed (for pain/headaches.).    [provider]  aspirin EC 81 MG tablet Take 1 tablet (81 mg total) by mouth daily. Swallow whole. 10/06/22   Cannon Kettle, PA-C  atorvastatin (LIPITOR) 80 MG tablet Take 1 tablet (80 mg total) by mouth daily. 10/06/22 01/04/23  Cannon Kettle, PA-C  benzonatate (TESSALON) 100 MG capsule Take 1-2 caps PO TID PRN 09/29/22   Mayers, Cari S, PA-C  cetirizine (ZYRTEC) 10 MG tablet TAKE 1 TABLET (10 MG TOTAL) BY MOUTH DAILY. 07/04/22   Grayce Sessions, NP  cloNIDine (CATAPRES) 0.1 MG tablet TAKE ONE (1) TABLET BY MOUTH THREE TIMES DAILY. Patient not taking: Reported on 09/29/2022 07/04/22   Grayce Sessions, NP  clopidogrel (PLAVIX) 75 MG tablet Take 1 tablet (75 mg total) by mouth daily. 10/06/22   Cannon Kettle, PA-C  fluticasone (FLONASE) 50 MCG/ACT nasal spray Place 2 sprays into both nostrils daily. 08/25/20   Grayce Sessions, NP  gabapentin (NEURONTIN) 300 MG capsule Take 1 capsule (300 mg total) by mouth 2 (two) times daily. 04/21/22   Nadara Mustard, MD  guaiFENesin (MUCINEX) 600 MG 12 hr tablet Take 1 tablet (600 mg total) by mouth 2 (two) times daily as needed for to loosen phlegm. 10/01/20   Grayce Sessions, NP  isoniazid (NYDRAZID) 300 MG tablet Take 1 tablet (300 mg total) by mouth daily. 05/12/22   Hughie Closs, MD  isosorbide mononitrate (IMDUR) 30 MG 24 hr tablet Take 1 tablet (30 mg total) by mouth daily. 10/06/22 01/04/23  Cannon Kettle, PA-C  metoprolol succinate (TOPROL XL) 25 MG 24 hr tablet Take 1 tablet (25 mg total) by mouth daily. 10/06/22   Cannon Kettle, PA-C  mirtazapine (REMERON) 7.5 MG tablet Take 1 tablet (7.5 mg total) by mouth at bedtime. Patient not taking: Reported on 05/27/2022 05/12/22 06/11/22  Hughie Closs, MD  nicotine (NICOTINE STEP 1) 21  mg/24hr patch Place 1 patch (21 mg total) onto the skin daily. 09/29/22   Mayers, Cari S, PA-C  nitroGLYCERIN (NITROSTAT) 0.4 MG SL tablet Place 1 tablet (0.4 mg total) under the tongue every 5 (five) minutes as needed for chest pain (CP or SOB). 07/22/17   Albertine Grates, MD  pyridOXINE (VITAMIN B6) 50 MG tablet Take 1 tablet (50 mg total) by mouth daily. 03/29/22   Gardiner Barefoot, MD  traZODone (DESYREL) 100 MG tablet TAKE 1/2 TO 1 TABLET BY MOUTH AT BEDTIME AS NEEDED FOR SLEEP. 06/04/22   Grayce Sessions, NP      Allergies    Morphine and codeine    Review of Systems   Review of Systems Negative except as per HPI.  Physical Exam Updated Vital Signs BP  123/61   Pulse 73   Temp 98.7 F (37.1 C)   Resp 15   SpO2 100%  Physical Exam Vitals and nursing note reviewed.  Constitutional:      Appearance: Normal appearance.  HENT:     Head: Normocephalic and atraumatic.     Mouth/Throat:     Mouth: Mucous membranes are moist.  Eyes:     General: No scleral icterus. Cardiovascular:     Rate and Rhythm: Normal rate and regular rhythm.     Pulses: Normal pulses.     Heart sounds: Normal heart sounds.  Pulmonary:     Effort: Pulmonary effort is normal.     Breath sounds: Normal breath sounds.  Abdominal:     General: Abdomen is flat.     Palpations: Abdomen is soft.     Tenderness: There is no abdominal tenderness.  Musculoskeletal:        General: No deformity.     Comments: Tenderness to palpation to paraspinal muscle of the left lower back and left-sided flank.  Skin:    General: Skin is warm.     Findings: No rash.  Neurological:     General: No focal deficit present.     Mental Status: He is alert.  Psychiatric:        Mood and Affect: Mood normal.     ED Results / Procedures / Treatments   Labs (all labs ordered are listed, but only abnormal results are displayed) Labs Reviewed  BASIC METABOLIC PANEL - Abnormal; Notable for the following components:      Result  Value   BUN 23 (*)    Creatinine, Ser 8.37 (*)    Calcium 8.6 (*)    GFR, Estimated 7 (*)    All other components within normal limits  CBC WITH DIFFERENTIAL/PLATELET - Abnormal; Notable for the following components:   RBC 3.43 (*)    Hemoglobin 10.9 (*)    HCT 34.7 (*)    MCV 101.2 (*)    All other components within normal limits  URINALYSIS, ROUTINE W REFLEX MICROSCOPIC    EKG None  Radiology CT Renal Stone Study  Result Date: 10/21/2022 CLINICAL DATA:  Flank pain.  Dialysis patient EXAM: CT ABDOMEN AND PELVIS WITHOUT CONTRAST TECHNIQUE: Multidetector CT imaging of the abdomen and pelvis was performed following the standard protocol without IV contrast. RADIATION DOSE REDUCTION: This exam was performed according to the departmental dose-optimization program which includes automated exposure control, adjustment of the mA and/or kV according to patient size and/or use of iterative reconstruction technique. COMPARISON:  Ultrasound 04/26/2016 FINDINGS: Lower chest: Mild motion along the lung bases. Minimal linear opacity seen likely scar or atelectasis. No pleural effusion. Slight hiatal hernia. Coronary artery calcifications are seen. Please correlate for other coronary risk factors. Trace pericardial fluid. Hepatobiliary: With limits of this non IV contrast exam, the liver is grossly preserved. Gallbladder is nondilated. Pancreas: Unremarkable. No pancreatic ductal dilatation or surrounding inflammatory changes. Spleen: Normal in size without focal abnormality.  Small splenules. Adrenals/Urinary Tract: Adrenal glands are preserved. There is severe atrophy of the kidneys. Low-attenuation area along the lower pole of the left kidney anteriorly measures 20 mm. Hounsfield units of 16. Benign cyst. No specific imaging follow-up. Prominent vascular calcifications extending into both renal hilum. No collecting system dilatation. No clear collecting system stones identified. The ureters have normal  course and caliber down to the bladder. Underdistended urinary bladder. Stomach/Bowel: No oral contrast. Some high density material along the course  of the colon with stool. The large bowel is nondilated. Few diverticula. Normal appendix extends posterior and inferior to the cecum in the right hemipelvis. Stomach is underdistended. Question some fold thickening along the stomach. Stomach is nondilated. Small bowel is nondilated. Vascular/Lymphatic: Extensive vascular calcifications identified along the aorta and branch vessels with significant areas of potential stenosis along the renal arteries, celiac, and iliac vessels. Please correlate with any particular symptoms. There is also significant plaque along the common femoral arteries, right-greater-than-left. No specific abnormal lymph node enlargement identified in the abdomen and pelvis. Reproductive: Prostate is unremarkable. Other: No free air or free fluid.  Anasarca. Musculoskeletal: Scattered degenerative changes along the spine and pelvis. IMPRESSION: Bilateral renal atrophy consistent with known history of chronic renal failure. No renal or ureteral stones. Extensive calcifications identified along the aorta and branch vessels with multilevel areas of stenosis. Please correlate with any particular symptoms. Significant calcifications along the renal artery branches in the renal hila bilaterally. No bowel obstruction.  Normal appendix.  Few colonic diverticula. Small hiatal hernia. Electronically Signed   By: Karen Kays M.D.   On: 10/21/2022 15:02    Procedures Procedures    Medications Ordered in ED Medications  HYDROmorphone (DILAUDID) injection 1 mg (1 mg Intravenous Given 10/21/22 1417)    ED Course/ Medical Decision Making/ A&P                             Medical Decision Making Amount and/or Complexity of Data Reviewed Labs: ordered. Radiology: ordered.  Risk Prescription drug management.   This patient presents to the ED for  left flank pain, low back pain, this involves an extensive number of treatment options, and is a complaint that carries with a high risk of complications and morbidity.  The differential diagnosis includes musculoskeletal pain, cauda equina, radiculopathy, kidney stones, pyelonephritis, cystitis, renal cyst.  This is not an exhaustive list.  Lab tests: I ordered and personally interpreted labs.  The pertinent results include: WBC unremarkable. Hbg unremarkable. Platelets unremarkable. Electrolytes unremarkable. BUN 23 downtrending from 3 weeks ago, creatinine 8.37 trending from 3 weeks ago.  Urinalysis pending.  Imaging studies: I ordered imaging studies. I personally reviewed, interpreted imaging and agree with the radiologist's interpretations. The results include: CT renal stone showed no evidence of renal or ureteral stones.  Problem list/ ED course/ Critical interventions/ Medical management: HPI: See above Vital signs within normal range and stable throughout visit. Laboratory/imaging studies significant for: See above. On physical examination, patient is afebrile and appears in no acute distress.  This patient presents with a chief complaint of left-sided flank pain and left lower back pain.  Patient was having dialysis this morning when he started to have acute onset of left-sided flank pain and back pain.  Patient states he is out of his oxycodone for few days.  CT renal stone showed no evidence of renal or ureteral stones.  Unlikely cauda equina patient has no urinary or bowel incontinence, saddle anesthesia or distal weakness.  Unlikely pyelonephritis, patient has no urinary symptoms, negative CVA tenderness.  I have low suspicion for other intra-abdominal emergencies such as appendicitis, SBO, diverticulitis.  Given Dilaudid for pain.  Reevaluation of patient after this medication showed that the patient improved.  Patient states he will return to dialysis tomorrow due to interrupted  dialysis session today.  He is stable for discharge.  Strict return precaution discussed. I have reviewed the patient home medicines and  have made adjustments as needed.  Cardiac monitoring/EKG: The patient was maintained on a cardiac monitor.  I personally reviewed and interpreted the cardiac monitor which showed an underlying rhythm of: sinus rhythm.  Additional history obtained: External records from outside source obtained and reviewed including: Chart review including previous notes, labs, imaging.  Consultations obtained:  Disposition Continued outpatient therapy. Follow-up with PCP recommended for reevaluation of symptoms. Treatment plan discussed with patient.  Pt acknowledged understanding was agreeable to the plan. Worrisome signs and symptoms were discussed with patient, and patient acknowledged understanding to return to the ED if they noticed these signs and symptoms. Patient was stable upon discharge.   This chart was dictated using voice recognition software.  Despite best efforts to proofread,  errors can occur which can change the documentation meaning.          Final Clinical Impression(s) / ED Diagnoses Final diagnoses:  Acute left-sided low back pain without sciatica  Flank pain    Rx / DC Orders ED Discharge Orders          Ordered    oxyCODONE-acetaminophen (PERCOCET/ROXICET) 5-325 MG tablet  Every 6 hours PRN        10/21/22 1545              Jeanelle Malling, [place] 10/21/22 1554    Gwyneth Sprout, MD 10/24/22 2333

## 2022-10-21 NOTE — Discharge Instructions (Addendum)
Please return to dialysis as soon as possible. I recommend close follow-up with PCP for reevaluation.  Please do not hesitate to return to emergency department if worrisome signs symptoms we discussed become apparent.

## 2022-10-22 ENCOUNTER — Encounter (HOSPITAL_COMMUNITY): Payer: Self-pay

## 2022-10-22 ENCOUNTER — Emergency Department (HOSPITAL_COMMUNITY)
Admission: EM | Admit: 2022-10-22 | Discharge: 2022-10-22 | Disposition: A | Discharge status: Home / Self Care | Payer: Medicaid Other | Attending: Emergency Medicine | Admitting: Emergency Medicine

## 2022-10-22 ENCOUNTER — Other Ambulatory Visit: Payer: Self-pay

## 2022-10-22 ENCOUNTER — Emergency Department (HOSPITAL_COMMUNITY): Payer: Medicaid Other

## 2022-10-22 DIAGNOSIS — R109 Unspecified abdominal pain: Secondary | ICD-10-CM

## 2022-10-22 DIAGNOSIS — Z7902 Long term (current) use of antithrombotics/antiplatelets: Secondary | ICD-10-CM | POA: Diagnosis not present

## 2022-10-22 DIAGNOSIS — N186 End stage renal disease: Secondary | ICD-10-CM | POA: Diagnosis not present

## 2022-10-22 DIAGNOSIS — Z7951 Long term (current) use of inhaled steroids: Secondary | ICD-10-CM | POA: Diagnosis not present

## 2022-10-22 DIAGNOSIS — Z992 Dependence on renal dialysis: Secondary | ICD-10-CM | POA: Diagnosis not present

## 2022-10-22 DIAGNOSIS — M549 Dorsalgia, unspecified: Secondary | ICD-10-CM | POA: Insufficient documentation

## 2022-10-22 DIAGNOSIS — Z7982 Long term (current) use of aspirin: Secondary | ICD-10-CM | POA: Diagnosis not present

## 2022-10-22 DIAGNOSIS — I120 Hypertensive chronic kidney disease with stage 5 chronic kidney disease or end stage renal disease: Secondary | ICD-10-CM | POA: Diagnosis not present

## 2022-10-22 LAB — CBC WITH DIFFERENTIAL/PLATELET
Abs Immature Granulocytes: 0.01 10*3/uL (ref 0.00–0.07)
Basophils Absolute: 0 10*3/uL (ref 0.0–0.1)
Basophils Relative: 1 %
Eosinophils Absolute: 0.3 10*3/uL (ref 0.0–0.5)
Eosinophils Relative: 4 %
HCT: 30.9 % — ABNORMAL LOW (ref 39.0–52.0)
Hemoglobin: 9.8 g/dL — ABNORMAL LOW (ref 13.0–17.0)
Immature Granulocytes: 0 %
Lymphocytes Relative: 20 %
Lymphs Abs: 1.2 10*3/uL (ref 0.7–4.0)
MCH: 33 pg (ref 26.0–34.0)
MCHC: 31.7 g/dL (ref 30.0–36.0)
MCV: 104 fL — ABNORMAL HIGH (ref 80.0–100.0)
Monocytes Absolute: 0.8 10*3/uL (ref 0.1–1.0)
Monocytes Relative: 13 %
Neutro Abs: 3.6 10*3/uL (ref 1.7–7.7)
Neutrophils Relative %: 62 %
Platelets: 248 10*3/uL (ref 150–400)
RBC: 2.97 MIL/uL — ABNORMAL LOW (ref 4.22–5.81)
RDW: 14.9 % (ref 11.5–15.5)
WBC: 5.8 10*3/uL (ref 4.0–10.5)
nRBC: 0 % (ref 0.0–0.2)

## 2022-10-22 LAB — COMPREHENSIVE METABOLIC PANEL
ALT: 13 U/L (ref 0–44)
AST: 13 U/L — ABNORMAL LOW (ref 15–41)
Albumin: 2.7 g/dL — ABNORMAL LOW (ref 3.5–5.0)
Alkaline Phosphatase: 177 U/L — ABNORMAL HIGH (ref 38–126)
Anion gap: 15 (ref 5–15)
BUN: 37 mg/dL — ABNORMAL HIGH (ref 6–20)
CO2: 26 mmol/L (ref 22–32)
Calcium: 7.8 mg/dL — ABNORMAL LOW (ref 8.9–10.3)
Chloride: 100 mmol/L (ref 98–111)
Creatinine, Ser: 10.53 mg/dL — ABNORMAL HIGH (ref 0.61–1.24)
GFR, Estimated: 5 mL/min — ABNORMAL LOW (ref >60)
Glucose, Bld: 112 mg/dL — ABNORMAL HIGH (ref 70–99)
Potassium: 3.9 mmol/L (ref 3.5–5.1)
Sodium: 141 mmol/L (ref 135–145)
Total Bilirubin: 0.5 mg/dL (ref 0.3–1.2)
Total Protein: 6.8 g/dL (ref 6.5–8.1)

## 2022-10-22 LAB — TROPONIN I (HIGH SENSITIVITY): Troponin I (High Sensitivity): 26 ng/L — ABNORMAL HIGH (ref <18)

## 2022-10-22 MED ORDER — HYDROMORPHONE HCL 1 MG/ML IJ SOLN
1.0000 mg | Freq: Once | INTRAMUSCULAR | Status: AC
  Administered 2022-10-22: 1 mg via INTRAVENOUS
  Filled 2022-10-22: qty 1

## 2022-10-22 MED ORDER — OXYCODONE-ACETAMINOPHEN 10-325 MG PO TABS: 1.0000 | ORAL_TABLET | Freq: Four times a day (QID) | ORAL | 0 refills | Status: DC | PRN

## 2022-10-22 MED ORDER — OXYCODONE-ACETAMINOPHEN 10-325 MG PO TABS: 1.0000 | ORAL_TABLET | Freq: Four times a day (QID) | ORAL | 0 refills | Status: AC | PRN

## 2022-10-22 MED ORDER — IOHEXOL 350 MG/ML SOLN
100.0000 mL | Freq: Once | INTRAVENOUS | Status: AC | PRN
  Administered 2022-10-22: 100 mL via INTRAVENOUS

## 2022-10-22 MED ORDER — ONDANSETRON HCL 4 MG/2ML IJ SOLN
4.0000 mg | Freq: Once | INTRAMUSCULAR | Status: AC
  Administered 2022-10-22: 4 mg via INTRAVENOUS
  Filled 2022-10-22: qty 2

## 2022-10-22 NOTE — ED Provider Notes (Signed)
[facility] AT [facility] Provider Note   CSN: [id number] Arrival date & time: 10/22/22  1946     History  Chief Complaint  Patient presents with   Back Pain    Khilan Gillison is a 57 y.o. male history of ESRD on dialysis, last dialysis yesterday, here presenting with left flank pain.  Patient had acute onset of left flank pain yesterday.  He went to the ER and had a CT renal stone that was unremarkable.  Patient had labs that showed baseline creatinine of 8 and normal potassium. Patient was prescribe percocet 5/325 mg.  He states that it has not helped him with the pain.  Denies any nausea or vomiting.  He states that he urinates sometimes.  Denies any chest pain  The history is provided by the patient.       Home Medications Prior to Admission medications   Medication Sig Start Date End Date Taking? Authorizing Provider  acetaminophen (TYLENOL) 500 MG tablet Take 1,000 mg by mouth every 6 (six) hours as needed (for pain/headaches.).    [provider]  aspirin EC 81 MG tablet Take 1 tablet (81 mg total) by mouth daily. Swallow whole. 10/06/22   Cannon Kettle, PA-C  atorvastatin (LIPITOR) 80 MG tablet Take 1 tablet (80 mg total) by mouth daily. 10/06/22 01/04/23  Cannon Kettle, PA-C  benzonatate (TESSALON) 100 MG capsule Take 1-2 caps PO TID PRN 09/29/22   Mayers, Cari S, PA-C  cetirizine (ZYRTEC) 10 MG tablet TAKE 1 TABLET (10 MG TOTAL) BY MOUTH DAILY. 07/04/22   Grayce Sessions, NP  cloNIDine (CATAPRES) 0.1 MG tablet TAKE ONE (1) TABLET BY MOUTH THREE TIMES DAILY. Patient not taking: Reported on 09/29/2022 07/04/22   Grayce Sessions, NP  clopidogrel (PLAVIX) 75 MG tablet Take 1 tablet (75 mg total) by mouth daily. 10/06/22   Cannon Kettle, PA-C  fluticasone (FLONASE) 50 MCG/ACT nasal spray Place 2 sprays into both nostrils daily. 08/25/20   Grayce Sessions, NP  gabapentin (NEURONTIN) 300 MG capsule Take 1 capsule (300 mg  total) by mouth 2 (two) times daily. 04/21/22   Nadara Mustard, MD  guaiFENesin (MUCINEX) 600 MG 12 hr tablet Take 1 tablet (600 mg total) by mouth 2 (two) times daily as needed for to loosen phlegm. 10/01/20   Grayce Sessions, NP  isoniazid (NYDRAZID) 300 MG tablet Take 1 tablet (300 mg total) by mouth daily. 05/12/22   Hughie Closs, MD  isosorbide mononitrate (IMDUR) 30 MG 24 hr tablet Take 1 tablet (30 mg total) by mouth daily. 10/06/22 01/04/23  Cannon Kettle, PA-C  metoprolol succinate (TOPROL XL) 25 MG 24 hr tablet Take 1 tablet (25 mg total) by mouth daily. 10/06/22   Cannon Kettle, PA-C  mirtazapine (REMERON) 7.5 MG tablet Take 1 tablet (7.5 mg total) by mouth at bedtime. Patient not taking: Reported on 05/27/2022 05/12/22 06/11/22  Hughie Closs, MD  nicotine (NICOTINE STEP 1) 21 mg/24hr patch Place 1 patch (21 mg total) onto the skin daily. 09/29/22   Mayers, Cari S, PA-C  nitroGLYCERIN (NITROSTAT) 0.4 MG SL tablet Place 1 tablet (0.4 mg total) under the tongue every 5 (five) minutes as needed for chest pain (CP or SOB). 07/22/17   Albertine Grates, MD  oxyCODONE-acetaminophen (PERCOCET/ROXICET) 5-325 MG tablet Take 1 tablet by mouth every 6 (six) hours as needed for severe pain. 10/21/22   Jeanelle Malling, PA  pyridOXINE (VITAMIN B6) 50 MG tablet  Take 1 tablet (50 mg total) by mouth daily. 03/29/22   Gardiner Barefoot, MD  traZODone (DESYREL) 100 MG tablet TAKE 1/2 TO 1 TABLET BY MOUTH AT BEDTIME AS NEEDED FOR SLEEP. 06/04/22   Grayce Sessions, NP      Allergies    Morphine and codeine    Review of Systems   Review of Systems  Musculoskeletal:  Positive for back pain.  All other systems reviewed and are negative.   Physical Exam Updated Vital Signs BP (!) 175/81   Pulse 81   Temp 98.5 F (36.9 C) (Oral)   Resp 18   SpO2 100%  Physical Exam Vitals and nursing note reviewed.  Constitutional:      Comments: Uncomfortable   HENT:     Head: Normocephalic.     Nose: Nose normal.      Mouth/Throat:     Mouth: Mucous membranes are moist.  Eyes:     Extraocular Movements: Extraocular movements intact.     Pupils: Pupils are equal, round, and reactive to light.  Cardiovascular:     Rate and Rhythm: Normal rate and regular rhythm.     Pulses: Normal pulses.     Heart sounds: Normal heart sounds.  Pulmonary:     Effort: Pulmonary effort is normal.     Breath sounds: Normal breath sounds.  Abdominal:     General: Abdomen is flat.     Palpations: Abdomen is soft.     Comments: + L CVAT   Musculoskeletal:        General: Normal range of motion.     Cervical back: Normal range of motion and neck supple.  Skin:    General: Skin is warm.     Comments: No obvious rash in the left flank area to suggest shingles   Neurological:     General: No focal deficit present.     Mental Status: He is alert and oriented to person, place, and time.  Psychiatric:        Behavior: Behavior normal.     ED Results / Procedures / Treatments   Labs (all labs ordered are listed, but only abnormal results are displayed) Labs Reviewed  CBC WITH DIFFERENTIAL/PLATELET - Abnormal; Notable for the following components:      Result Value   RBC 2.97 (*)    Hemoglobin 9.8 (*)    HCT 30.9 (*)    MCV 104.0 (*)    All other components within normal limits  COMPREHENSIVE METABOLIC PANEL - Abnormal; Notable for the following components:   Glucose, Bld 112 (*)    BUN 37 (*)    Creatinine, Ser 10.53 (*)    Calcium 7.8 (*)    Albumin 2.7 (*)    AST 13 (*)    Alkaline Phosphatase 177 (*)    GFR, Estimated 5 (*)    All other components within normal limits  TROPONIN I (HIGH SENSITIVITY) - Abnormal; Notable for the following components:   Troponin I (High Sensitivity) 26 (*)    All other components within normal limits  URINALYSIS, W/ REFLEX TO CULTURE (INFECTION SUSPECTED)  TROPONIN I (HIGH SENSITIVITY)    EKG EKG Interpretation  Date/Time:  Saturday October 22 2022 21:02:31  EDT Ventricular Rate:  76 PR Interval:  165 QRS Duration: 92 QT Interval:  392 QTC Calculation: 441 R Axis:   66 Text Interpretation: Sinus rhythm Borderline repolarization abnormality Minimal ST elevation, anterior leads No significant change since last tracing Confirmed by Silverio Lay,  Sandria Bales ([id number]) on 10/22/2022 9:21:52 PM  Radiology CT Angio Abd/Pel W and/or Wo Contrast  Result Date: 10/22/2022 CLINICAL DATA:  Mesenteric ischemia, left flank pain EXAM: CTA ABDOMEN AND PELVIS WITHOUT AND WITH CONTRAST TECHNIQUE: Multidetector CT imaging of the abdomen and pelvis was performed using the standard protocol during bolus administration of intravenous contrast. Multiplanar reconstructed images and MIPs were obtained and reviewed to evaluate the vascular anatomy. RADIATION DOSE REDUCTION: This exam was performed according to the departmental dose-optimization program which includes automated exposure control, adjustment of the mA and/or kV according to patient size and/or use of iterative reconstruction technique. CONTRAST:  OMNIPAQUE IOHEXOL 350 MG/ML SOLN COMPARISON:  None Available. FINDINGS: VASCULAR Aorta: No aneurysm or dissection. Extensive atherosclerotic calcification. No periaortic inflammatory change or fluid collection identified. Celiac: 50-75% stenosis at the origin. Distally, classic anatomic configuration with wide patency. No aneurysm or dissection. SMA: Less than 50% stenosis at the origin.  Distally widely patent. Renals: The renal arteries are diminutive in caliber with a focal 50-75% stenosis involving the proximal right renal artery noted. No hemodynamically significant stenosis involving the left renal artery. No aneurysm or dissection. IMA: Significant stenosis at the origin, estimated at 50-75%, with a diffusely diminutive but patent vessel noted subsequently. Inflow: Extensive atherosclerotic calcification. 50-75% stenoses of the common iliac artery origins bilaterally. No evidence of  hemodynamically significant stenosis involving the external iliac arteries bilaterally though extensive atherosclerotic calcification noted. Internal iliac arteries are heavily diseased bilaterally but appear patent. Proximal Outflow: Moderate mixed atherosclerotic plaque within the right common femoral artery results in a 50% stenosis of the right SFA origin. Veins: No obvious venous abnormality within the limitations of this arterial phase study. Review of the MIP images confirms the above findings. NON-VASCULAR Lower chest: Extensive multi-vessel coronary artery calcification. Global cardiac size within normal limits. No pericardial effusion. Visualized lung bases are clear. Hepatobiliary: No focal liver abnormality is seen. No gallstones, gallbladder wall thickening, or biliary dilatation. Pancreas: Unremarkable Spleen: Unremarkable Adrenals/Urinary Tract: The adrenal glands are unremarkable. The kidneys are normal in position. Mild bilateral renal cortical atrophy. Simple cortical cyst noted within the lower pole the left kidney for which no follow-up imaging is recommended. The kidneys are otherwise unremarkable. Bladder unremarkable. Stomach/Bowel: Stomach is within normal limits. Appendix appears normal. No evidence of bowel wall thickening, distention, or inflammatory changes. Lymphatic: No pathologic adenopathy within the abdomen and pelvis. Reproductive: Prostate is unremarkable. Other: No abdominal wall hernia Musculoskeletal: Osseous structures are age-appropriate. No acute bone abnormality. No lytic or blastic bone lesion. IMPRESSION: 1. 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 Superimposed 50-75% stenosis of the IMA origin. Clinical correlation for evidence of chronic mesenteric ischemia may be helpful for further management. 2. 50-75% stenosis of the proximal right renal artery. 3. 50-75% stenoses of the common iliac artery origins bilaterally. Superimposed 50% stenosis of the right SFA origin.  Clinical correlation for signs of lower extremity arterial insufficiency (claudication, rest pain) may be helpful for further management. 4. Extensive multi-vessel coronary artery calcification. 5. No acute intra-abdominal pathology identified. No definite radiographic explanation for the patient's reported symptoms. Aortic Atherosclerosis (ICD10-I70.0). Electronically Signed   By: Helyn Numbers M.D.   On: 10/22/2022 22:16   CT Renal Stone Study  Result Date: 10/21/2022 CLINICAL DATA:  Flank pain.  Dialysis patient EXAM: CT ABDOMEN AND PELVIS WITHOUT CONTRAST TECHNIQUE: Multidetector CT imaging of the abdomen and pelvis was performed following the standard protocol without IV contrast. RADIATION DOSE REDUCTION: This exam was  performed according to the departmental dose-optimization program which includes automated exposure control, adjustment of the mA and/or kV according to patient size and/or use of iterative reconstruction technique. COMPARISON:  Ultrasound 04/26/2016 FINDINGS: Lower chest: Mild motion along the lung bases. Minimal linear opacity seen likely scar or atelectasis. No pleural effusion. Slight hiatal hernia. Coronary artery calcifications are seen. Please correlate for other coronary risk factors. Trace pericardial fluid. Hepatobiliary: With limits of this non IV contrast exam, the liver is grossly preserved. Gallbladder is nondilated. Pancreas: Unremarkable. No pancreatic ductal dilatation or surrounding inflammatory changes. Spleen: Normal in size without focal abnormality.  Small splenules. Adrenals/Urinary Tract: Adrenal glands are preserved. There is severe atrophy of the kidneys. Low-attenuation area along the lower pole of the left kidney anteriorly measures 20 mm. Hounsfield units of 16. Benign cyst. No specific imaging follow-up. Prominent vascular calcifications extending into both renal hilum. No collecting system dilatation. No clear collecting system stones identified. The ureters  have normal course and caliber down to the bladder. Underdistended urinary bladder. Stomach/Bowel: No oral contrast. Some high density material along the course of the colon with stool. The large bowel is nondilated. Few diverticula. Normal appendix extends posterior and inferior to the cecum in the right hemipelvis. Stomach is underdistended. Question some fold thickening along the stomach. Stomach is nondilated. Small bowel is nondilated. Vascular/Lymphatic: Extensive vascular calcifications identified along the aorta and branch vessels with significant areas of potential stenosis along the renal arteries, celiac, and iliac vessels. Please correlate with any particular symptoms. There is also significant plaque along the common femoral arteries, right-greater-than-left. No specific abnormal lymph node enlargement identified in the abdomen and pelvis. Reproductive: Prostate is unremarkable. Other: No free air or free fluid.  Anasarca. Musculoskeletal: Scattered degenerative changes along the spine and pelvis. IMPRESSION: Bilateral renal atrophy consistent with known history of chronic renal failure. No renal or ureteral stones. Extensive calcifications identified along the aorta and branch vessels with multilevel areas of stenosis. Please correlate with any particular symptoms. Significant calcifications along the renal artery branches in the renal hila bilaterally. No bowel obstruction.  Normal appendix.  Few colonic diverticula. Small hiatal hernia. Electronically Signed   By: Karen Kays M.D.   On: 10/21/2022 15:02    Procedures Procedures    Angiocath insertion Performed by: Richardean Canal  Consent: Verbal consent obtained. Risks and benefits: risks, benefits and alternatives were discussed Time out: Immediately prior to procedure a "time out" was called to verify the correct patient, procedure, equipment, support staff and site/side marked as required.  Preparation: Patient was prepped and draped in  the usual sterile fashion.  Vein Location: R forearm   Ultrasound Guided  Gauge: 20 long   Normal blood return and flush without difficulty Patient tolerance: Patient tolerated the procedure well with no immediate complications.    Medications Ordered in ED Medications  HYDROmorphone (DILAUDID) injection 1 mg (has no administration in time range)  HYDROmorphone (DILAUDID) injection 1 mg (1 mg Intravenous Given 10/22/22 2115)  ondansetron (ZOFRAN) injection 4 mg (4 mg Intravenous Given 10/22/22 2111)  iohexol (OMNIPAQUE) 350 MG/ML injection 100 mL (100 mLs Intravenous Contrast Given 10/22/22 2131)    ED Course/ Medical Decision Making/ A&P                             Medical Decision Making Jaheir Paisley is a 57 y.o. male here presenting with left flank pain. I reviewed imaging from yesterday.  Patient has atherosclerotic disease on CT renal stone.  Will get CTA abdomen pelvis to rule out mesenteric ischemia or vascular disease.  Will give pain medicine and reassess  10:47 PM I reviewed patient's labs and creatinine is 10.  Potassium is normal.  CTA showed multiple areas of stenosis.  I discussed case with Dr. Sherral Hammers from vascular surgery.  He states that since patient the SMA is fully open and even though the celiac and IMA has 50% stenosis, does not think patient has mesenteric ischemia.  Patient's pain is improved with IV pain medicine.  Patient does not urinate often and does not have any urinary symptoms currently.  It turns out that he has chronic pain and is on 10 mg of Percocet and ran out early.  I suspect that his back pain is because he ran out of pain meds and needs a refill.  I discussed in detail regarding pain contract and he states that he wants a prescription that he will discuss with his doctor on Monday regarding refilling his pain medicine early.  He understands that this may violate his pain contract.   Problems Addressed: ESRD (end stage renal disease) on dialysis  [facility]): acute illness or injury Flank pain: acute illness or injury  Amount and/or Complexity of Data Reviewed Labs: ordered. Decision-making details documented in ED Course. Radiology: ordered and independent interpretation performed. Decision-making details documented in ED Course. ECG/medicine tests: ordered.  Risk Prescription drug management.   Final Clinical Impression(s) / ED Diagnoses Final diagnoses:  None    Rx / DC Orders ED Discharge Orders     None         Charlynne Pander, MD 10/22/22 2252

## 2022-10-22 NOTE — ED Triage Notes (Signed)
Pt. Arrives POV c/o L sided back pain. Pt. Was seen yesterday for the same. He was told that he did not have any kidney stones, but states that he feels like it is his kidney that is hurting.

## 2022-10-22 NOTE — Discharge Instructions (Signed)
As we discussed, your scans did not show any acute findings in your abdomen.  I have refilled your Percocet 10/325 mg which is your usual dose.  You need to call your pain management doctor on Monday to let them know that you were in the ER and you have filled pain medicine  Go to dialysis on Monday  Return to ER if you have worse flank pain, abdominal pain, fever, vomiting

## 2022-10-24 ENCOUNTER — Ambulatory Visit: Payer: Medicaid Other | Admitting: Orthopedic Surgery

## 2022-10-25 ENCOUNTER — Telehealth: Payer: Self-pay

## 2022-10-25 NOTE — Transitions of Care (Post Inpatient/ED Visit) (Signed)
10/25/2022  Name: Cody Webster MRN: [id number] DOB: 10/24/1965  Today's TOC FU Call Status: Today's TOC FU Call Status:: Successful TOC FU Call Competed TOC FU Call Complete Date: 10/25/22  Transition Care Management Follow-up Telephone Call Date of Discharge: 10/22/22 Discharge Facility: [facility]) Type of Discharge: Emergency Department How have you been since you were released from the hospital?: Worse Any questions or concerns?: No  Items Reviewed: Did you receive and understand the discharge instructions provided?: No Medications obtained,verified, and reconciled?: Yes (Medications Reviewed) Any new allergies since your discharge?: No Dietary orders reviewed?: No Do you have support at home?: Yes  Medications Reviewed Today: Medications Reviewed Today     Reviewed by Clotilde Dieter, CMA (Certified Medical Assistant) on 10/06/22 at [phone]  Med List Status: <None>   Medication Order Taking? Sig Documenting Provider Last Dose Status Informant  acetaminophen (TYLENOL) 500 MG tablet [id number] Yes Take 1,000 mg by mouth every 6 (six) hours as needed (for pain/headaches.). [provider] Taking Active Self  aspirin EC 81 MG tablet [id number] Yes Take 1 tablet (81 mg total) by mouth daily. Swallow whole. Mayers, Cari S, PA-C Taking Active   atorvastatin (LIPITOR) 40 MG tablet [id number] Yes Take 1 tablet (40 mg total) by mouth daily. Mayers, Cari S, PA-C Taking Active   benzonatate (TESSALON) 100 MG capsule [id number] Yes Take 1-2 caps PO TID PRN Mayers, Cari S, PA-C Taking Active   carvedilol (COREG) 25 MG tablet [id number] No Take 1 tablet (25 mg total) by mouth 2 (two) times daily with a meal.  Patient not taking: Reported on 10/06/2022   Mayers, Cari S, PA-C Not Taking Active   cetirizine (ZYRTEC) 10 MG tablet [id number] Yes TAKE 1 TABLET (10 MG TOTAL) BY MOUTH DAILY. Grayce Sessions, NP Taking Active   cloNIDine (CATAPRES) 0.1 MG tablet [id number] No TAKE ONE (1)  TABLET BY MOUTH THREE TIMES DAILY.  Patient not taking: Reported on 09/29/2022   Grayce Sessions, NP Not Taking Active   fluticasone [facility]) 50 MCG/ACT nasal spray [id number] Yes Place 2 sprays into both nostrils daily. Grayce Sessions, NP Taking Active Self  gabapentin (NEURONTIN) 300 MG capsule [id number] Yes Take 1 capsule (300 mg total) by mouth 2 (two) times daily. Nadara Mustard, MD Taking Active Self  guaiFENesin (MUCINEX) 600 MG 12 hr tablet [id number] Yes Take 1 tablet (600 mg total) by mouth 2 (two) times daily as needed for to loosen phlegm. Grayce Sessions, NP Taking Active Self  hydrALAZINE (APRESOLINE) 50 MG tablet [id number] No Take 2 tablets (100 mg total) by mouth 3 (three) times daily.  Patient not taking: Reported on 05/27/2022   Hughie Closs, MD Not Taking Active   isoniazid (NYDRAZID) 300 MG tablet [id number] Yes Take 1 tablet (300 mg total) by mouth daily. Hughie Closs, MD Taking Active   isosorbide mononitrate (IMDUR) 60 MG 24 hr tablet [id number]  Take 1 tablet (60 mg total) by mouth daily.  Patient not taking: Reported on 05/27/2022   Hughie Closs, MD  Active   mirtazapine (REMERON) 7.5 MG tablet [id number]  Take 1 tablet (7.5 mg total) by mouth at bedtime.  Patient not taking: Reported on 05/27/2022   Hughie Closs, MD  Expired 06/11/22 2359   nicotine (NICOTINE STEP 1) 21 mg/24hr patch [id number] Yes Place 1 patch (21 mg total) onto the skin daily. Mayers, Cari S, PA-C Taking Active   nitroGLYCERIN (NITROSTAT) 0.4 MG SL tablet [id number] Yes Place 1 tablet (0.4  mg total) under the tongue every 5 (five) minutes as needed for chest pain (CP or SOB). Albertine Grates, MD Taking Active Self           Med Note Judithann Graves, [facility]   Wed May 04, 2022  3:17 PM) Needs New Rx ( out dated Rx)  pyridOXINE (VITAMIN B6) 50 MG tablet [id number] Yes Take 1 tablet (50 mg total) by mouth daily. Gardiner Barefoot, MD Taking Active Self  traZODone (DESYREL) 100 MG tablet [id number] Yes TAKE 1/2  TO 1 TABLET BY MOUTH AT BEDTIME AS NEEDED FOR SLEEP. Grayce Sessions, NP Taking Active             Home Care and Equipment/Supplies: Were Home Health Services Ordered?: No Any new equipment or medical supplies ordered?: No  Functional Questionnaire: Do you need assistance with bathing/showering or dressing?: No Do you need assistance with meal preparation?: No Do you need assistance with eating?: No Do you have difficulty maintaining continence: No Do you need assistance with getting out of bed/getting out of a chair/moving?: No Do you have difficulty managing or taking your medications?: No  Follow up appointments reviewed: PCP Follow-up appointment confirmed?: No (Patient states he has been unable to get in touch with PCP office) Do you need transportation to your follow-up appointment?: No Do you understand care options if your condition(s) worsen?: Yes-patient verbalized understanding   SDOH Interventions    Flowsheet Row Patient Outreach Telephone from 05/27/2022 in [place] POPULATION HEALTH DEPARTMENT  SDOH Interventions   Alcohol Usage Interventions Intervention Not Indicated (Score <7)  Stress Interventions Intervention Not Indicated       Gus Puma, BSW, [facility]  Managed [facility] Social Worker [phone]

## 2022-10-25 NOTE — Transitions of Care (Post Inpatient/ED Visit) (Signed)
   10/25/2022  Name: Cody Webster MRN: [id number] DOB: [birth year]  The patient was given information about care management services as a benefit of their Medicaid health plan today.   Patient                                              did not agree to enrollment in care management services and does not wish to consider at this time.    Patient states he is moving back to [place] on Saturday.  Abelino Derrick, MHA [facility]  Managed [facility] Social Worker [phone]

## 2022-11-03 ENCOUNTER — Telehealth: Payer: Self-pay | Admitting: *Deleted

## 2022-11-03 NOTE — Telephone Encounter (Signed)
I will send update to requesting office, please see notes from Edd Fabian, FNP.

## 2022-11-03 NOTE — Telephone Encounter (Signed)
   Pre-operative Risk Assessment    Patient Name: Cody Webster  DOB: 1966/04/01 MRN: [id number]      Request for Surgical Clearance    Procedure:   Right Brachiocephalic Fistula Creation.  Date of Surgery:  Clearance TBD                                 Surgeon:  Dr. Waverly Ferrari Surgeon's Group or Practice Name:  [facility] Phone number:  [phone] Fax number:  [phone]   Type of Clearance Requested:   - Medical  - Pharmacy:  Hold Aspirin and Clopidogrel (Plavix) Not Indicated.   Type of Anesthesia:  MAC/Peripheral nerve block   Additional requests/questions:    Signed, Emmit Pomfret   11/03/2022, 4:01 PM

## 2022-11-03 NOTE — Telephone Encounter (Signed)
Preoperative team, please contact requesting office and let him know that patient underwent cardiac stenting 09/27/2022.  He will not be eligible to hold dual antiplatelet therapy until 09/27/2023.  Interruption of his dual antiplatelet therapy could compromise his cardiac stent.  Thank you for your help.  Thomasene Ripple. Bernece Gall NP-C     11/03/2022, 4:21 PM [facility] [address] Office [phone] Fax [phone]

## 2022-11-09 ENCOUNTER — Telehealth: Payer: Self-pay

## 2022-11-09 NOTE — Telephone Encounter (Signed)
Pt is unable to hold AC's prior to surgery, per cardiology. Pt needs to be seen in office prior to r/s surgery, per Dr. Edilia Bo. Pt has been scheduled an appt with MD later this month and is aware of this appt.

## 2022-11-29 ENCOUNTER — Other Ambulatory Visit: Payer: Self-pay | Admitting: Orthopedic Surgery

## 2022-12-08 ENCOUNTER — Ambulatory Visit: Payer: Medicaid Other | Admitting: Vascular Surgery

## 2022-12-15 ENCOUNTER — Telehealth: Payer: Self-pay | Admitting: Vascular Surgery

## 2022-12-15 NOTE — Telephone Encounter (Signed)
Pt no show appt on 07/25. Called to r/s per AN

## 2023-01-02 NOTE — Progress Notes (Deleted)
Cardiology Office Note   Date:  01/02/2023  ID:  Cody Webster, DOB 04/06/1966, MRN [id number] PCP:  Grayce Sessions, NP [facility] Cardiologist: Bryan Lemma, MD  Reason for visit: 3 month follow-up  History of Present Illness    Cody Webster is a 57 y.o. male with a hx of end-stage renal disease on dialysis, hypertension, GERD, latent TB.  To note patient was hospitalized in December 2023 with unstable angina and elevated troponin.  Echo showed EF of 45% slightly reduced from previous.  Left heart catheterization was recommended but deferred secondary to acute stroke.  Outpatient ischemic evaluation recommended but patient did not come to follow-up appointment.   He presented to the ED on Sep 26, 2022 for chest pressure & shortness of breath.  LHC Sep 27, 2022 showed severe ostial/proximal RCA stenosis of 90% treated with DES.  Moderate disease in mid RCA, mid LAD and mid left circumflex -treated medically.  Pt left AMA.  I last saw patient in May 2024.  Since he left the hospital AMA, he did not leave with cardiac prescriptions.  Patient complained of decreased energy and shortness of breath with exertion.  No chest pain.  Patient was planning to get a bike and exercise at home.  Since patient was not on antiplatelet with recent DES, we started Plavix, Toprol, Lipitor and Imdur.  PCP was working on tobacco sensation with him.  Today, ***  Coronary artery disease, no chest pain -LHC in May 2024 with 90% ostial/prox RCA disease reduced to 0% with Synergy DES.  Moderate CAD in mid RCA, mid LAD and mid left circumflex -treated medically.  Pt left AMA without prescriptions.  Prescriptions restarted at the office appointment. -***  -DAPT with aspirin & Plavix x 12 months without interruption. -Load Plavix 300 mg, start Plavix 75 mg daily thereafter. -Restart beta-blocker -Toprol XL 25 mg once daily. -Prescribe high intensity statin -Lipitor 80 mg daily. -For residual  symptoms, start Imdur 30 mg daily. -Given handout on healthy lifestyle.  Advised if he changes mind about cardiac rehab, we can send a referral at his follow-up appointment in 3 months. -His PCP is working on tobacco cessation with him -she has prescribed nicotine patch.   Ischemic cardiomyopathy, euvolemic Mitral regurgitation -Echo December 2023 with EF 45 to 50%, mild LVH, grade 2 diastolic dysfunction, mild to moderate MR -Volume management per HD.   Hypertension, well-controlled -***   Hyperlipidemia with goal LDL less than 70 -***  -LDL 77 in May 2024.  Prescribed Lipitor 80 mg daily. -Discussed cholesterol lowering diets - Mediterranean diet, DASH diet, vegetarian diet, low-carbohydrate diet and avoidance of trans fats.  Discussed healthier choice substitutes.  Nuts, high-fiber foods, and fiber supplements may also improve lipids.     Tobacco use  -Recommend tobacco cessation.  Reviewed physiologic effects of nicotine and the immediate-eventual benefits of quitting including improvement in cough/breathing and reduction in cardiovascular events.  Discussed quitting tips such as removing triggers and getting support from family/friends and Quitline [place].   Disposition - Follow-up in ***    Objective / Physical Exam   EKG today: ***  Vital signs:  There were no vitals taken for this visit.    GEN: No acute distress NECK: No carotid bruits CARDIAC: ***RRR, no murmurs RESPIRATORY:  Clear to auscultation without rales, wheezing or rhonchi  EXTREMITIES: No edema  Assessment and Plan   ***   {Are you ordering a CV Procedure (e.g. stress test, cath, DCCV, TEE, etc)?  Press F2        :[id number]}    Signed, Bernette Mayers  01/02/2023 [facility]

## 2023-01-04 ENCOUNTER — Ambulatory Visit: Payer: Medicaid Other | Attending: Physician Assistant | Admitting: Physician Assistant

## 2023-01-04 DIAGNOSIS — I255 Ischemic cardiomyopathy: Secondary | ICD-10-CM

## 2023-01-04 DIAGNOSIS — I340 Nonrheumatic mitral (valve) insufficiency: Secondary | ICD-10-CM

## 2023-01-04 DIAGNOSIS — E782 Mixed hyperlipidemia: Secondary | ICD-10-CM

## 2023-01-04 DIAGNOSIS — I10 Essential (primary) hypertension: Secondary | ICD-10-CM

## 2023-01-04 DIAGNOSIS — I2510 Atherosclerotic heart disease of native coronary artery without angina pectoris: Secondary | ICD-10-CM

## 2023-01-07 ENCOUNTER — Observation Stay (HOSPITAL_COMMUNITY)
Admission: EM | Admit: 2023-01-07 | Discharge: 2023-01-08 | Discharge status: Against Medical Advice | Payer: Medicaid Other | Attending: Internal Medicine | Admitting: Internal Medicine

## 2023-01-07 DIAGNOSIS — I132 Hypertensive heart and chronic kidney disease with heart failure and with stage 5 chronic kidney disease, or end stage renal disease: Secondary | ICD-10-CM | POA: Diagnosis not present

## 2023-01-07 DIAGNOSIS — I5042 Chronic combined systolic (congestive) and diastolic (congestive) heart failure: Secondary | ICD-10-CM | POA: Insufficient documentation

## 2023-01-07 DIAGNOSIS — K219 Gastro-esophageal reflux disease without esophagitis: Secondary | ICD-10-CM | POA: Diagnosis present

## 2023-01-07 DIAGNOSIS — E1122 Type 2 diabetes mellitus with diabetic chronic kidney disease: Secondary | ICD-10-CM | POA: Insufficient documentation

## 2023-01-07 DIAGNOSIS — Z992 Dependence on renal dialysis: Secondary | ICD-10-CM | POA: Insufficient documentation

## 2023-01-07 DIAGNOSIS — Z79899 Other long term (current) drug therapy: Secondary | ICD-10-CM | POA: Insufficient documentation

## 2023-01-07 DIAGNOSIS — F17210 Nicotine dependence, cigarettes, uncomplicated: Secondary | ICD-10-CM | POA: Diagnosis not present

## 2023-01-07 DIAGNOSIS — E119 Type 2 diabetes mellitus without complications: Secondary | ICD-10-CM

## 2023-01-07 DIAGNOSIS — N186 End stage renal disease: Secondary | ICD-10-CM | POA: Diagnosis not present

## 2023-01-07 DIAGNOSIS — R0789 Other chest pain: Secondary | ICD-10-CM | POA: Diagnosis present

## 2023-01-07 DIAGNOSIS — N189 Chronic kidney disease, unspecified: Secondary | ICD-10-CM

## 2023-01-07 DIAGNOSIS — J309 Allergic rhinitis, unspecified: Secondary | ICD-10-CM | POA: Diagnosis present

## 2023-01-07 DIAGNOSIS — R10A0 Flank pain, unspecified side: Secondary | ICD-10-CM

## 2023-01-07 DIAGNOSIS — Z7902 Long term (current) use of antithrombotics/antiplatelets: Secondary | ICD-10-CM | POA: Diagnosis not present

## 2023-01-07 DIAGNOSIS — I2510 Atherosclerotic heart disease of native coronary artery without angina pectoris: Secondary | ICD-10-CM | POA: Diagnosis not present

## 2023-01-07 DIAGNOSIS — R079 Chest pain, unspecified: Principal | ICD-10-CM

## 2023-01-07 DIAGNOSIS — R109 Unspecified abdominal pain: Secondary | ICD-10-CM

## 2023-01-07 DIAGNOSIS — Z955 Presence of coronary angioplasty implant and graft: Secondary | ICD-10-CM | POA: Insufficient documentation

## 2023-01-07 DIAGNOSIS — Z7982 Long term (current) use of aspirin: Secondary | ICD-10-CM | POA: Insufficient documentation

## 2023-01-07 HISTORY — DX: Atherosclerotic heart disease of native coronary artery without angina pectoris: I25.10

## 2023-01-08 ENCOUNTER — Other Ambulatory Visit (HOSPITAL_COMMUNITY): Payer: Medicaid Other

## 2023-01-08 ENCOUNTER — Emergency Department (HOSPITAL_COMMUNITY): Payer: Medicaid Other

## 2023-01-08 ENCOUNTER — Other Ambulatory Visit: Payer: Self-pay

## 2023-01-08 ENCOUNTER — Encounter (HOSPITAL_COMMUNITY): Payer: Self-pay

## 2023-01-08 DIAGNOSIS — I5042 Chronic combined systolic (congestive) and diastolic (congestive) heart failure: Secondary | ICD-10-CM

## 2023-01-08 DIAGNOSIS — Z992 Dependence on renal dialysis: Secondary | ICD-10-CM

## 2023-01-08 DIAGNOSIS — K219 Gastro-esophageal reflux disease without esophagitis: Secondary | ICD-10-CM | POA: Diagnosis present

## 2023-01-08 DIAGNOSIS — J309 Allergic rhinitis, unspecified: Secondary | ICD-10-CM | POA: Diagnosis present

## 2023-01-08 DIAGNOSIS — N189 Chronic kidney disease, unspecified: Secondary | ICD-10-CM

## 2023-01-08 DIAGNOSIS — N186 End stage renal disease: Secondary | ICD-10-CM

## 2023-01-08 DIAGNOSIS — R0789 Other chest pain: Secondary | ICD-10-CM | POA: Diagnosis not present

## 2023-01-08 DIAGNOSIS — Z862 Personal history of diseases of the blood and blood-forming organs and certain disorders involving the immune mechanism: Secondary | ICD-10-CM

## 2023-01-08 DIAGNOSIS — E1142 Type 2 diabetes mellitus with diabetic polyneuropathy: Secondary | ICD-10-CM

## 2023-01-08 DIAGNOSIS — J301 Allergic rhinitis due to pollen: Secondary | ICD-10-CM

## 2023-01-08 LAB — CBC
HCT: 29.6 % — ABNORMAL LOW (ref 39.0–52.0)
Hemoglobin: 9.6 g/dL — ABNORMAL LOW (ref 13.0–17.0)
MCH: 32.5 pg (ref 26.0–34.0)
MCHC: 32.4 g/dL (ref 30.0–36.0)
MCV: 100.3 fL — ABNORMAL HIGH (ref 80.0–100.0)
Platelets: 172 10*3/uL (ref 150–400)
RBC: 2.95 MIL/uL — ABNORMAL LOW (ref 4.22–5.81)
RDW: 14.7 % (ref 11.5–15.5)
WBC: 6.9 10*3/uL (ref 4.0–10.5)
nRBC: 0 % (ref 0.0–0.2)

## 2023-01-08 LAB — PROTIME-INR
INR: 1 (ref 0.8–1.2)
Prothrombin Time: 13.5 seconds (ref 11.4–15.2)

## 2023-01-08 LAB — COMPREHENSIVE METABOLIC PANEL
ALT: 12 U/L (ref 0–44)
AST: 11 U/L — ABNORMAL LOW (ref 15–41)
Albumin: 3.1 g/dL — ABNORMAL LOW (ref 3.5–5.0)
Alkaline Phosphatase: 158 U/L — ABNORMAL HIGH (ref 38–126)
Anion gap: 19 — ABNORMAL HIGH (ref 5–15)
BUN: 35 mg/dL — ABNORMAL HIGH (ref 6–20)
CO2: 26 mmol/L (ref 22–32)
Calcium: 9.4 mg/dL (ref 8.9–10.3)
Chloride: 89 mmol/L — ABNORMAL LOW (ref 98–111)
Creatinine, Ser: 12.68 mg/dL — ABNORMAL HIGH (ref 0.61–1.24)
GFR, Estimated: 4 mL/min — ABNORMAL LOW (ref >60)
Glucose, Bld: 101 mg/dL — ABNORMAL HIGH (ref 70–99)
Potassium: 4 mmol/L (ref 3.5–5.1)
Sodium: 134 mmol/L — ABNORMAL LOW (ref 135–145)
Total Bilirubin: 0.6 mg/dL (ref 0.3–1.2)
Total Protein: 8 g/dL (ref 6.5–8.1)

## 2023-01-08 LAB — HEPATIC FUNCTION PANEL
ALT: 11 U/L (ref 0–44)
AST: 14 U/L — ABNORMAL LOW (ref 15–41)
Albumin: 3 g/dL — ABNORMAL LOW (ref 3.5–5.0)
Alkaline Phosphatase: 151 U/L — ABNORMAL HIGH (ref 38–126)
Bilirubin, Direct: 0.2 mg/dL (ref 0.0–0.2)
Indirect Bilirubin: 0.5 mg/dL (ref 0.3–0.9)
Total Bilirubin: 0.7 mg/dL (ref 0.3–1.2)
Total Protein: 7.8 g/dL (ref 6.5–8.1)

## 2023-01-08 LAB — CBC WITH DIFFERENTIAL/PLATELET
Abs Immature Granulocytes: 0.02 10*3/uL (ref 0.00–0.07)
Basophils Absolute: 0.1 10*3/uL (ref 0.0–0.1)
Basophils Relative: 1 %
Eosinophils Absolute: 0.4 10*3/uL (ref 0.0–0.5)
Eosinophils Relative: 5 %
HCT: 33.5 % — ABNORMAL LOW (ref 39.0–52.0)
Hemoglobin: 10.8 g/dL — ABNORMAL LOW (ref 13.0–17.0)
Immature Granulocytes: 0 %
Lymphocytes Relative: 19 %
Lymphs Abs: 1.6 10*3/uL (ref 0.7–4.0)
MCH: 32.3 pg (ref 26.0–34.0)
MCHC: 32.2 g/dL (ref 30.0–36.0)
MCV: 100.3 fL — ABNORMAL HIGH (ref 80.0–100.0)
Monocytes Absolute: 0.8 10*3/uL (ref 0.1–1.0)
Monocytes Relative: 9 %
Neutro Abs: 5.8 10*3/uL (ref 1.7–7.7)
Neutrophils Relative %: 66 %
Platelets: 189 10*3/uL (ref 150–400)
RBC: 3.34 MIL/uL — ABNORMAL LOW (ref 4.22–5.81)
RDW: 14.7 % (ref 11.5–15.5)
WBC: 8.7 10*3/uL (ref 4.0–10.5)
nRBC: 0 % (ref 0.0–0.2)

## 2023-01-08 LAB — TROPONIN I (HIGH SENSITIVITY)
Troponin I (High Sensitivity): 38 ng/L — ABNORMAL HIGH (ref <18)
Troponin I (High Sensitivity): 37 ng/L — ABNORMAL HIGH (ref <18)
Troponin I (High Sensitivity): 48 ng/L — ABNORMAL HIGH (ref <18)

## 2023-01-08 LAB — HEMOGLOBIN A1C
Hgb A1c MFr Bld: 6 % — ABNORMAL HIGH (ref 4.8–5.6)
Mean Plasma Glucose: 125.5 mg/dL

## 2023-01-08 LAB — BASIC METABOLIC PANEL
Anion gap: 18 — ABNORMAL HIGH (ref 5–15)
BUN: 34 mg/dL — ABNORMAL HIGH (ref 6–20)
CO2: 28 mmol/L (ref 22–32)
Calcium: 9.4 mg/dL (ref 8.9–10.3)
Chloride: 89 mmol/L — ABNORMAL LOW (ref 98–111)
Creatinine, Ser: 12.07 mg/dL — ABNORMAL HIGH (ref 0.61–1.24)
GFR, Estimated: 4 mL/min — ABNORMAL LOW (ref >60)
Glucose, Bld: 117 mg/dL — ABNORMAL HIGH (ref 70–99)
Potassium: 3.8 mmol/L (ref 3.5–5.1)
Sodium: 135 mmol/L (ref 135–145)

## 2023-01-08 LAB — MRSA NEXT GEN BY PCR, NASAL: MRSA by PCR Next Gen: NOT DETECTED

## 2023-01-08 LAB — GLUCOSE, CAPILLARY: Glucose-Capillary: 78 mg/dL (ref 70–99)

## 2023-01-08 LAB — MAGNESIUM: Magnesium: 2.6 mg/dL — ABNORMAL HIGH (ref 1.7–2.4)

## 2023-01-08 LAB — LIPASE, BLOOD: Lipase: 22 U/L (ref 11–51)

## 2023-01-08 MED ORDER — HYDROMORPHONE HCL 1 MG/ML IJ SOLN
1.0000 mg | Freq: Once | INTRAMUSCULAR | Status: AC
  Administered 2023-01-08: 1 mg via INTRAVENOUS
  Filled 2023-01-08: qty 1

## 2023-01-08 MED ORDER — METOPROLOL SUCCINATE ER 25 MG PO TB24: 25.0000 mg | ORAL_TABLET | Freq: Every day | ORAL | Status: DC

## 2023-01-08 MED ORDER — METOPROLOL SUCCINATE ER 25 MG PO TB24
25.0000 mg | ORAL_TABLET | Freq: Every day | ORAL | Status: DC
  Administered 2023-01-08: 25 mg via ORAL
  Filled 2023-01-08: qty 1

## 2023-01-08 MED ORDER — ACETAMINOPHEN 650 MG RE SUPP: 650.0000 mg | Freq: Four times a day (QID) | RECTAL | Status: DC | PRN

## 2023-01-08 MED ORDER — ACETAMINOPHEN 325 MG PO TABS: 650.0000 mg | ORAL_TABLET | Freq: Four times a day (QID) | ORAL | Status: DC | PRN

## 2023-01-08 MED ORDER — FLUTICASONE PROPIONATE 50 MCG/ACT NA SUSP
2.0000 | Freq: Every day | NASAL | Status: DC
  Filled 2023-01-08: qty 16

## 2023-01-08 MED ORDER — ASPIRIN 81 MG PO CHEW
324.0000 mg | CHEWABLE_TABLET | Freq: Once | ORAL | Status: AC
  Administered 2023-01-08: 324 mg via ORAL
  Filled 2023-01-08: qty 4

## 2023-01-08 MED ORDER — ONDANSETRON HCL 4 MG/2ML IJ SOLN: 4.0000 mg | Freq: Four times a day (QID) | INTRAMUSCULAR | Status: DC | PRN

## 2023-01-08 MED ORDER — ATORVASTATIN CALCIUM 80 MG PO TABS: 80.0000 mg | ORAL_TABLET | Freq: Every day | ORAL | Status: DC

## 2023-01-08 MED ORDER — NALOXONE HCL 0.4 MG/ML IJ SOLN: 0.4000 mg | INTRAMUSCULAR | Status: DC | PRN

## 2023-01-08 MED ORDER — CLOPIDOGREL BISULFATE 75 MG PO TABS: 75.0000 mg | ORAL_TABLET | Freq: Every day | ORAL | Status: DC

## 2023-01-08 MED ORDER — GABAPENTIN 300 MG PO CAPS: 300.0000 mg | ORAL_CAPSULE | Freq: Two times a day (BID) | ORAL | Status: DC

## 2023-01-08 MED ORDER — ISOSORBIDE MONONITRATE ER 30 MG PO TB24: 30.0000 mg | ORAL_TABLET | Freq: Every day | ORAL | Status: DC

## 2023-01-08 MED ORDER — HYDROMORPHONE HCL 1 MG/ML IJ SOLN: 0.5000 mg | INTRAMUSCULAR | Status: DC | PRN

## 2023-01-08 MED ORDER — IOHEXOL 350 MG/ML SOLN
75.0000 mL | Freq: Once | INTRAVENOUS | Status: AC | PRN
  Administered 2023-01-08: 75 mL via INTRAVENOUS

## 2023-01-08 MED ORDER — MELATONIN 3 MG PO TABS: 3.0000 mg | ORAL_TABLET | Freq: Every evening | ORAL | Status: DC | PRN

## 2023-01-08 MED ORDER — ASPIRIN 81 MG PO TBEC: 81.0000 mg | DELAYED_RELEASE_TABLET | Freq: Every day | ORAL | Status: DC

## 2023-01-08 MED ORDER — INSULIN ASPART 100 UNIT/ML IJ SOLN: 0.0000 [IU] | Freq: Three times a day (TID) | INTRAMUSCULAR | Status: DC

## 2023-01-08 MED ORDER — PANTOPRAZOLE SODIUM 40 MG IV SOLR: 40.0000 mg | Freq: Every day | INTRAVENOUS | Status: DC

## 2023-01-08 MED ORDER — LORATADINE 10 MG PO TABS: 10.0000 mg | ORAL_TABLET | Freq: Every day | ORAL | Status: DC

## 2023-01-08 NOTE — H&P (Signed)
History and Physical      Cody Webster:[id number] DOB: 12-Aug-1965 DOA: 01/07/2023; DOS: 01/08/2023  PCP: Grayce Sessions, NP  Patient coming from: home   I have personally briefly reviewed patient's old medical records in [facility]  Chief Complaint: chest pain  HPI: Cody Webster is a 57 y.o. male with medical history significant for coronary artery disease status post stent to the RCA in May 2024, chronic systolic/diastolic heart failure, incisional disease on hemodialysis on Monday, Wednesday, Friday schedule, chronic low back pain, GERD, type 2 diabetes mellitus complicated by diabetic peripheral polyneuropathy, who is admitted to [facility] on 01/07/2023 with atypical chest pain after presenting from home to [facility] ED complaining of chest pain.   The patient reports 2 days of intermittent sharp substernal chest pain radiating into the left arm.  Chest pain is nonexertional, nonpleuritic, nonpositional, not reproducible to palpation over the anterior chest wall.  Denies any associated shortness of breath, nausea, vomiting, diaphoresis, dizziness, presyncope, syncope, or palpitations.  These intermittent episodes of chest discomfort are self-limited, lasting for few seconds to 2 to 3 minutes, before spontaneously resolving in the absence of any pharmacologic intervention.  He confirms no nitroglycerin over the last 2 days, although he is on daily Imdur.  The patient has a history of coronary artery disease, and conveys that the intermittent chest pain he has been experiencing over the course of the last day is very similar to that which she was experiencing at the time of previous heart attack.  He underwent left-sided heart cath on 09/27/2022, at which time he received stent to the RCA, with angiography also revealing multivessel nonobstructive CAD that was otherwise medically managed.  He reports good compliance with interval dual antiplatelet therapy on daily baby aspirin as  well as Plavix.  No recent cough, hemoptysis, nor any recent subjective fever, chills, rigors, or generalized myalgias.  No recent trauma.  His medical history is also notable for chronic systolic/diastolic heart failure, with most recent echocardiogram on 05/05/2022 notable for LVEF 45 to 50%, grade 2 diastolic dysfunction, normal right ventricular systolic function and mild to moderate mitral regurgitation.  He also has a history of end-stage renal disease on hemodialysis, on Monday, Wednesday, Friday schedule, conveying no recently missed hemodialysis sessions and also confirming that his next scheduled hemodialysis session is to occur on Monday, 01/09/2023.  He also reports some left lower back pain, which he notes is chronic in nature, and well-controlled on his outpatient prn Percocet.    ED Course:  Vital signs in the ED were notable for the following: Afebrile; heart rates in the 80s to 90s; systolic blood pressures in the range of 130s to 160s; respiratory rate 18-23, oxygen saturation 95 to 100% on room air.  Labs were notable for the following: CMP notable for the following: Sodium 135, potassium 3.8, prn 28, creatinine 12.07, glucose 117, liver enzymes within normal limits.  Lipase 22.  Initial high-sensitivity troponin I 30, 3 value trending down at 37, relative doses and prior high-sensitivity troponin I value of 26 on 10/22/2022, and relative to maximum high-sensitivity troponin I value during May 2024 hospitalization, which is noted to be 790.  CBC notable for white cell count 6900, hemoglobin 9.6 relative demonstrates prior hemoglobin data point of 9.8 on 10/22/2022.  Per my interpretation, EKG in ED demonstrated the following: EKG shows sinus rhythm with heart rate 87, normal intervals, and no evidence of T wave or ST changes, including no evidence of ST  ovation.  Imaging in the ED, per corresponding formal radiology read, was notable for the following: Due to acute chest x-ray showed  no evidence of acute cardiopulmonary process, Cleen evidence of allergic edema, effusion, or pneumothorax.  CTA chest, abdomen, pelvis with dissection protocol showed no evidence of acute aortic syndrome or any evidence of acute cardiopulmonary process, including and symmetric, edema, effusion, or pneumothorax.  While in the ED, the following were administered: Full dose aspirin x 1, Dilaudid 1 mg IV x 2 doses.  Subsequently, the patient was admitted for further evaluation and management of presenting atypical chest pain.     Review of Systems: As per HPI otherwise 10 point review of systems negative.   Past Medical History:  Diagnosis Date   Anemia    CAD (coronary artery disease)    stent to RCA in May '24   Carotid artery occlusion    Chronic combined systolic (congestive) and diastolic (congestive) heart failure (HCC)    a. 06/2014 Echo: EF 50%; b. 07/2017 Echo: EF 55-60%; c. 07/2020 Echo: EF 50-55%; d. 04/2022 Echo: EF 45-50%, basal-mid anterolateral and basal to mid inf HK. Mild conc LVH, GrII DD. NL RV fxn. RVSP 51.53mmHg. Sev dil LA. Mildly dil RA. MIld-mod MR.   Depression    ESRD (end stage renal disease) (HCC)    a. 04/2022 - initiated HD-->MWF   GERD (gastroesophageal reflux disease)    Hypertensive heart disease    Ischemic cardiomyopathy    a. 06/2014 Echo: EF 50%; b. 07/2017 Echo: EF 55-60%; d. 04/2018 MV: EF 47%, moderate inf ischemia; e. 04/2022 Echo: EF 45-50%.   Latent tuberculosis    Peripheral neuropathy    Pneumonia    PTSD (post-traumatic stress disorder)    Seizures (HCC)    27 years ago   Stroke [facility])    a. 04/2022 MRI Brain: small acute infarct - R posterior pons and subcortical L cerebral hemisphere. Chronic sm vessel dzs.   Tobacco abuse    Type II diabetes mellitus (HCC)    type 2    Past Surgical History:  Procedure Laterality Date   AV FISTULA PLACEMENT Left 11/08/2016   Procedure: ARTERIOVENOUS (AV) FISTULA CREATION;  Surgeon: Chuck Hint,  MD;  Location: [facility] OR;  Service: Vascular;  Laterality: Left;   AV FISTULA PLACEMENT Left 03/23/2020   Procedure: LEFT BASILIC VEIN FISTULA CREATION;  Surgeon: Chuck Hint, MD;  Location: [facility] OR;  Service: Vascular;  Laterality: Left;   CORONARY LITHOTRIPSY N/A 09/27/2022   Procedure: CORONARY LITHOTRIPSY;  Surgeon: [name], MD;  Location: [facility] INVASIVE CV LAB;  Service: Cardiovascular;  Laterality: N/A;   CORONARY STENT INTERVENTION N/A 09/27/2022   Procedure: CORONARY STENT INTERVENTION;  Surgeon: [name], MD;  Location: [facility] INVASIVE CV LAB;  Service: Cardiovascular;  Laterality: N/A;   IR FLUORO GUIDE CV LINE RIGHT  05/10/2022   IR US GUIDE VASC ACCESS RIGHT  05/10/2022   LEFT HEART CATH AND CORONARY ANGIOGRAPHY N/A 09/27/2022   Procedure: LEFT HEART CATH AND CORONARY ANGIOGRAPHY;  Surgeon: [name], MD;  Location: [facility] INVASIVE CV LAB;  Service: Cardiovascular;  Laterality: N/A;   NM MYOVIEW LTD  07/2017   "Moderate sized moderate severity" (on cardiology was very small size, small intensity) partially reversible inferoapical//inferoseptal defect.  (Read as intermediate-high risk) -> over read by cardiology as LOW RISK   NM MYOVIEW LTD  09/2019   [facility] Small sized, mild severity reversible defect involving apical lateral & mid anterolateral wall  thought to be artifacts - but CRO mild ischemia.  EF 55%. => Read as LOW RISK:   right foot surgery     TOE AMPUTATION     TRANSTHORACIC ECHOCARDIOGRAM  07/2017   EF 60%.,  Moderate LVH.  GR 1 DD.  No R WMA.  Mild RV and moderate RA dilation.   TRANSTHORACIC ECHOCARDIOGRAM  10/03/2019   [facility]: EF 40%, mild MR, mild aortic calcification    Social History:  reports that he has been smoking cigarettes. He has a 30 pack-year smoking history. He quit smokeless tobacco use about 39 years ago.  His smokeless tobacco use included snuff and chew. He reports that he does not currently use alcohol. He reports that he  does not currently use drugs.   Allergies  Allergen Reactions   Morphine And Codeine Shortness Of Breath and Other (See Comments)    Hallucination  Tolerates Norco/Vicodin    Family History  Problem Relation Age of Onset   CAD Mother    Heart attack Mother    Diabetes type II Father    Stroke Father    CAD Father    Hypertension Sister    CAD Sister    Diabetes type II Brother    Colon cancer Maternal Uncle        2015   Colon polyps Neg Hx    Esophageal cancer Neg Hx    Stomach cancer Neg Hx    Rectal cancer Neg Hx     Family history reviewed and not pertinent    Prior to Admission medications   Medication Sig Start Date End Date Taking? Authorizing Provider  acetaminophen (TYLENOL) 500 MG tablet Take 1,000 mg by mouth every 6 (six) hours as needed (for pain/headaches.).    [provider]  aspirin EC 81 MG tablet Take 1 tablet (81 mg total) by mouth daily. Swallow whole. 10/06/22   Cannon Kettle, PA-C  atorvastatin (LIPITOR) 80 MG tablet Take 1 tablet (80 mg total) by mouth daily. 10/06/22 01/04/23  Cannon Kettle, PA-C  benzonatate (TESSALON) 100 MG capsule Take 1-2 caps PO TID PRN 09/29/22   Mayers, Cari S, PA-C  cetirizine (ZYRTEC) 10 MG tablet TAKE 1 TABLET (10 MG TOTAL) BY MOUTH DAILY. 07/04/22   Grayce Sessions, NP  cloNIDine (CATAPRES) 0.1 MG tablet TAKE ONE (1) TABLET BY MOUTH THREE TIMES DAILY. Patient not taking: Reported on 09/29/2022 07/04/22   Grayce Sessions, NP  clopidogrel (PLAVIX) 75 MG tablet Take 1 tablet (75 mg total) by mouth daily. 10/06/22   Cannon Kettle, PA-C  fluticasone (FLONASE) 50 MCG/ACT nasal spray Place 2 sprays into both nostrils daily. 08/25/20   Grayce Sessions, NP  gabapentin (NEURONTIN) 300 MG capsule TAKE 1 CAPSULE (300 MG TOTAL) BY MOUTH 2 (TWO) TIMES DAILY. 11/29/22   Nadara Mustard, MD  guaiFENesin (MUCINEX) 600 MG 12 hr tablet Take 1 tablet (600 mg total) by mouth 2 (two) times daily as needed for to  loosen phlegm. 10/01/20   Grayce Sessions, NP  isoniazid (NYDRAZID) 300 MG tablet Take 1 tablet (300 mg total) by mouth daily. 05/12/22   Hughie Closs, MD  isosorbide mononitrate (IMDUR) 30 MG 24 hr tablet Take 1 tablet (30 mg total) by mouth daily. 10/06/22 01/04/23  Cannon Kettle, PA-C  metoprolol succinate (TOPROL XL) 25 MG 24 hr tablet Take 1 tablet (25 mg total) by mouth daily. 10/06/22   Cannon Kettle, PA-C  mirtazapine (REMERON) 7.5  MG tablet Take 1 tablet (7.5 mg total) by mouth at bedtime. Patient not taking: Reported on 05/27/2022 05/12/22 06/11/22  Hughie Closs, MD  nicotine (NICOTINE STEP 1) 21 mg/24hr patch Place 1 patch (21 mg total) onto the skin daily. 09/29/22   Mayers, Cari S, PA-C  nitroGLYCERIN (NITROSTAT) 0.4 MG SL tablet Place 1 tablet (0.4 mg total) under the tongue every 5 (five) minutes as needed for chest pain (CP or SOB). 07/22/17   Albertine Grates, MD  oxyCODONE-acetaminophen (PERCOCET) 10-325 MG tablet Take 1 tablet by mouth every 6 (six) hours as needed for pain. 10/22/22   Charlynne Pander, MD  oxyCODONE-acetaminophen (PERCOCET/ROXICET) 5-325 MG tablet Take 1 tablet by mouth every 6 (six) hours as needed for severe pain. 10/21/22   Jeanelle Malling, PA  pyridOXINE (VITAMIN B6) 50 MG tablet Take 1 tablet (50 mg total) by mouth daily. 03/29/22   Gardiner Barefoot, MD  traZODone (DESYREL) 100 MG tablet TAKE 1/2 TO 1 TABLET BY MOUTH AT BEDTIME AS NEEDED FOR SLEEP. 06/04/22   Grayce Sessions, NP     Objective    Physical Exam: Vitals:   01/08/23 0450 01/08/23 0500 01/08/23 0515 01/08/23 0530  BP: (!) 159/80 (!) 142/72 (!) 139/95 (!) 174/85  Pulse: 95 94 97 85  Resp: 20 10 (!) 22 18  Temp:      TempSrc:      SpO2: 96% 95% 95% 100%    General: appears to be stated age; alert, oriented Skin: warm, dry, no rash Head:  AT/[place] Mouth:  Oral mucosa membranes appear moist, normal dentition Neck: supple; trachea midline Heart:  RRR; did not appreciate any M/R/G Lungs: CTAB,  did not appreciate any wheezes, rales, or rhonchi Abdomen: + BS; soft, ND, NT Vascular: 2+ pedal pulses b/l; 2+ radial pulses b/l Extremities: no peripheral edema, no muscle wasting Neuro: strength and sensation intact in upper and lower extremities b/l    Labs on Admission: I have personally reviewed following labs and imaging studies  CBC: Recent Labs  Lab 01/08/23 0024  WBC 6.9  HGB 9.6*  HCT 29.6*  MCV 100.3*  PLT 172   Basic Metabolic Panel: Recent Labs  Lab 01/08/23 0024  NA 135  K 3.8  CL 89*  CO2 28  GLUCOSE 117*  BUN 34*  CREATININE 12.07*  CALCIUM 9.4   GFR: CrCl cannot be calculated (Unknown ideal weight.). Liver Function Tests: Recent Labs  Lab 01/08/23 0305  AST 14*  ALT 11  ALKPHOS 151*  BILITOT 0.7  PROT 7.8  ALBUMIN 3.0*   Recent Labs  Lab 01/08/23 0305  LIPASE 22   No results for input(s): "AMMONIA" in the last 168 hours. Coagulation Profile: No results for input(s): "INR", "PROTIME" in the last 168 hours. Cardiac Enzymes: No results for input(s): "CKTOTAL", "CKMB", "CKMBINDEX", "TROPONINI" in the last 168 hours. BNP (last 3 results) No results for input(s): "PROBNP" in the last 8760 hours. HbA1C: No results for input(s): "HGBA1C" in the last 72 hours. CBG: No results for input(s): "GLUCAP" in the last 168 hours. Lipid Profile: No results for input(s): "CHOL", "HDL", "LDLCALC", "TRIG", "CHOLHDL", "LDLDIRECT" in the last 72 hours. Thyroid Function Tests: No results for input(s): "TSH", "T4TOTAL", "FREET4", "T3FREE", "THYROIDAB" in the last 72 hours. Anemia Panel: No results for input(s): "VITAMINB12", "FOLATE", "FERRITIN", "TIBC", "IRON", "RETICCTPCT" in the last 72 hours. Urine analysis:    Component Value Date/Time   COLORURINE YELLOW 04/23/2016 2336   APPEARANCEUR CLEAR 04/23/2016 2336  LABSPEC 1.012 04/23/2016 2336   PHURINE 5.0 04/23/2016 2336   GLUCOSEU 50 (A) 04/23/2016 2336   HGBUR SMALL (A) 04/23/2016 2336    BILIRUBINUR NEGATIVE 04/23/2016 2336   KETONESUR NEGATIVE 04/23/2016 2336   PROTEINUR >=300 (A) 04/23/2016 2336   UROBILINOGEN 0.2 01/11/2015 1740   NITRITE NEGATIVE 04/23/2016 2336   LEUKOCYTESUR NEGATIVE 04/23/2016 2336    Radiological Exams on Admission: CT Angio Chest/Abd/Pel for Dissection W and/or Wo Contrast  Result Date: 01/08/2023 CLINICAL DATA:  Acute aortic syndrome suspected. EXAM: CT ANGIOGRAPHY CHEST, ABDOMEN AND PELVIS TECHNIQUE: Non-contrast CT of the chest was initially obtained. Multidetector CT imaging through the chest, abdomen and pelvis was performed using the standard protocol during bolus administration of intravenous contrast. Multiplanar reconstructed images and MIPs were obtained and reviewed to evaluate the vascular anatomy. RADIATION DOSE REDUCTION: This exam was performed according to the departmental dose-optimization program which includes automated exposure control, adjustment of the mA and/or kV according to patient size and/or use of iterative reconstruction technique. CONTRAST:  75mL OMNIPAQUE IOHEXOL 350 MG/ML SOLN COMPARISON:  10/22/2022 FINDINGS: CTA CHEST FINDINGS Cardiovascular: Preferential opacification of the thoracic aorta. No evidence of thoracic aortic aneurysm or dissection. Normal heart size. No pericardial effusion. Confluent atheromatous calcification of the aorta and great vessels. Extensive atheromatous coronary calcification. Mediastinum/Nodes: Negative for hematoma or adenopathy Lungs/Pleura: Generalized airway thickening. There is no edema, consolidation, effusion, or pneumothorax. Calcified granuloma in the right lower lobe Musculoskeletal: Gynecomastia.  No acute finding. Review of the MIP images confirms the above findings. CTA ABDOMEN AND PELVIS FINDINGS VASCULAR Aorta: Diffuse atheromatous wall thickening. No aneurysm or dissection. 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 No branch occlusion or  aneurysm SMA: Calcified plaque but no flow reducing stenosis. No branch occlusion Renals: Diffuse atheromatous plaque with heavy calcification causing over 50% stenosis at the right renal ostium. Symmetric renal enhancement. No aneurysm or dissection seen IMA: Patent Inflow: Heavily calcified atheromatous plaque. At least 50% narrowing at proximal right common iliac artery. No dissection or aneurysm Veins: Unremarkable in the arterial phase Review of the MIP images confirms the above findings. NON-VASCULAR Hepatobiliary: No focal liver abnormality.No evidence of biliary obstruction or stone. Pancreas: Unremarkable. Spleen: Unremarkable. Adrenals/Urinary Tract: Negative adrenals. No hydronephrosis or stone. Bilateral renal cortical thinning. Simple cyst at the lower pole left kidney measuring 2.3 cm. No follow-up imaging is recommended. Unremarkable bladder. Stomach/Bowel:  No obstruction. No appendicitis. Lymphatic: No mass or adenopathy. Reproductive:No pathologic findings. Other: No ascites or pneumoperitoneum. Musculoskeletal: No acute abnormalities. Review of the MIP images confirms the above findings. IMPRESSION: 1. No acute finding including evidence of acute aortic syndrome. 2. 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 Electronically Signed   By: Tiburcio Pea M.D.   On: 01/08/2023 04:36   DG Chest 2 View  Result Date: 01/08/2023 CLINICAL DATA:  Chest pain EXAM: CHEST - 2 VIEW COMPARISON:  09/26/2022 FINDINGS: Cardiac shadow is stable. Aortic calcifications are noted. Dialysis catheter is seen in satisfactory position. Lungs are clear bilaterally. No bony abnormality is noted. IMPRESSION: No active cardiopulmonary disease. Electronically Signed   By: Alcide Clever M.D.   On: 01/08/2023 00:53      Assessment/Plan   Principal Problem:   Atypical chest pain Active Problems:   History of anemia due to chronic kidney  disease   Chronic combined systolic and diastolic heart failure (HCC)   DM2 (diabetes mellitus, type  2) (HCC)   End-stage renal disease on hemodialysis (HCC)   GERD (gastroesophageal reflux disease)   Allergic rhinitis    #) Atypical Chest Pain: 2 days of sharp intermittent substernal chest discomfort radiating into the left arm, nonexertional, and self-limited, in the absence of any use of nitroglycerin, the description of which appears to be atypical for ACS; however, given the presence of some typical characteristics in this patient with known CAD and multiple risk factors for interval progression of known underlying CAD, with a presenting HEART score of 5 conveying a moderate risk of major acute cardiac event over the ensuing 6 weeks, the decision was made to admit patient for overnight observation in order to rule-out ACS.   Of note, troponin x 2 are minimally elevated and trending down, and are 1/20 the value of high sensitive troponin data points noted during the May 2024 hospitalization.  EKG shows no evidence of acute ischemic changes, including no evidence of STEMI, while CTA chest, abdomen, pelvis with dissection protocol shows no evidence of aortic dissection/rupture, as well as no evidence of acute cardiopulmonary process, including no evidence of pneumothorax.  Patient is chest pain-free at this time.  Of note, full dose aspirin was administered in the ED this evening.  Aside from ACS, which appears clinically less likely at the present time, differential also includes non-cardiac etiologies including musculoskeletal possibilities such as costochondritis, GI sources including GERD, particular in the context of a documented history thereof, in the absence of any associated dedicated pharmacologic management as an outpatient at this time vs  esophagitis vs gastritis vs PUD vs esophageal spasm, as well as anxiety. Presentation is clinically less suggestive of acute PE.  Of note, presenting  lipase nonelevated.   Plan: trend serial troponin. Monitor on telemetry. prn Dilaudid.  PRN EKG for subsequent episodes of chest pain. Check serum Mg level and repeat BMP in the morning, with prn supplementation to maintain Mg and potassium levels greater than or equal to 2.0 and 4.0, respectively, to further reduce risk of ventricular arrhythmia. Repeat CBC in the AM.  Echocardiogram in the morning.  Continue outpatient beta-blocker and continue outpatient high intensity atorvastatin.  Protonix 40 mg IV daily, with first dose now.                    #) ESRD: on HD (schedule: Monday, Wednesday, Friday). Next due for routine HD on Monday, 01/09/2023. No clinical evidence for urgent overnight HD or to expedite HD relative to this timeframe.   Plan: monitor strict I's/O's, daily weights. CMP in the AM. Check mag and phos levels.  If the patient remains hospitalized sending him to 01/09/2023, would then anticipate consultation of nephrology for arrangement of routine scheduled hemodialysis.                     #) Chronic combined systolic and diastolic heart failure: documented history of such, with most recent echocardiogram performed December 2023, which is notable for LVEF 45 to 50% with grade 2 diastolic dysfunction, with additional results as conveyed above.  No clinical or radiographic evidence to suggest acutely decompensated heart failure at this time.  Appears to be an uric at baseline in the setting of end-stage renal disease on hemodialysis.  Not on a diuretic medications at home.  Outpatient medications include metoprolol succinate.  Plan: monitor strict I's & O's and daily weights. Repeat BMP in the morning. Check serum magnesium level.Continue home beta-blocker.                  #)  GERD: documented h/o such; not on any dedicated pharmacologic management as an outpatient.  In the setting of presenting atypical chest pain, will initiate a course  of IV Protonix.  Plan: Protonix 40 mg IV daily, first dose now.                 #) Type 2 Diabetes Mellitus: documented history of such. Manage for lifestyle modifications in the absence of any outpatient insulin or oral hypoglycemic agents.  Most recent hemoglobin A1c noted to be 5.1% when checked in December 2023.  Presenting blood sugar noted to be 117.  Complicated by diabetic peripheral polyneuropathy for which she is on gabapentin at home.  Plan: accuchecks QAC and HS with low dose SSI.  Resume home gabapentin. Add on hemoglobin A1c level.                  #) Allergic Rhinitis: documented h/o such, on scheduled intranasal Flonase as outpatient as well as scheduled Zyrtec.   Plan: cont home Flonase And Zyrtec.                   #) Anemia of chronic kidney disease: Documented history of such, a/w with baseline hgb range 8-11, with presenting hgb consistent with this range, in the absence of any overt evidence of active bleed.     Plan: Repeat CBC in the morning.  Check INR.       DVT prophylaxis: SCD's   Code Status: DNR, consistent with CODE STATUS documentation from at least the last 4 hospitalizations, and confirmed via patient  Family Communication: none Disposition Plan: Per Rounding Team Consults called: none;  Admission status: Observation     I SPENT GREATER THAN 75  MINUTES IN CLINICAL CARE TIME/MEDICAL DECISION-MAKING IN COMPLETING THIS ADMISSION.      Chaney Born Juma Oxley DO [facility]  From 7PM - 7AM   01/08/2023, 5:55 AM

## 2023-01-08 NOTE — Progress Notes (Signed)
Patient stated " I want to leave, yall are not helping me. I'm leaving." Patient educated about leaving AMA. MD made aware. Patient signed AMA paperwork and IV removed. Patient walked out of unit under his own power.

## 2023-01-08 NOTE — Discharge Summary (Signed)
Physician Discharge Summary  Cody Webster MVH:[id number] DOB: 06-10-1965 DOA: 01/07/2023  PCP: Grayce Sessions, NP  Admit date: 01/07/2023 Discharge date: 01/08/2023  Admitted From: Home Disposition:  Against medical advice  Discharge Diagnoses:  Principal Problem:   Atypical chest pain Active Problems:   History of anemia due to chronic kidney disease   Chronic combined systolic and diastolic heart failure (HCC)   DM2 (diabetes mellitus, type 2) (HCC)   End-stage renal disease on hemodialysis (HCC)   GERD (gastroesophageal reflux disease)   Allergic rhinitis  Patient admitted for evaluation of atypical chest pain - left AGAINST MEDICAL ADVICE prior to being evaluated.    Allergies  Allergen Reactions   Morphine And Codeine Shortness Of Breath and Other (See Comments)    Hallucination  Tolerates Norco/Vicodin   Procedures/Studies: CT Angio Chest/Abd/Pel for Dissection W and/or Wo Contrast  Result Date: 01/08/2023 CLINICAL DATA:  Acute aortic syndrome suspected. EXAM: CT ANGIOGRAPHY CHEST, ABDOMEN AND PELVIS TECHNIQUE: Non-contrast CT of the chest was initially obtained. Multidetector CT imaging through the chest, abdomen and pelvis was performed using the standard protocol during bolus administration of intravenous contrast. Multiplanar reconstructed images and MIPs were obtained and reviewed to evaluate the vascular anatomy. RADIATION DOSE REDUCTION: This exam was performed according to the departmental dose-optimization program which includes automated exposure control, adjustment of the mA and/or kV according to patient size and/or use of iterative reconstruction technique. CONTRAST:  75mL OMNIPAQUE IOHEXOL 350 MG/ML SOLN COMPARISON:  10/22/2022 FINDINGS: CTA CHEST FINDINGS Cardiovascular: Preferential opacification of the thoracic aorta. No evidence of thoracic aortic aneurysm or dissection. Normal heart size. No pericardial effusion. Confluent atheromatous calcification of the  aorta and great vessels. Extensive atheromatous coronary calcification. Mediastinum/Nodes: Negative for hematoma or adenopathy Lungs/Pleura: Generalized airway thickening. There is no edema, consolidation, effusion, or pneumothorax. Calcified granuloma in the right lower lobe Musculoskeletal: Gynecomastia.  No acute finding. Review of the MIP images confirms the above findings. CTA ABDOMEN AND PELVIS FINDINGS VASCULAR Aorta: Diffuse atheromatous wall thickening. No aneurysm or dissection. 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 No branch occlusion or aneurysm SMA: Calcified plaque but no flow reducing stenosis. No branch occlusion Renals: Diffuse atheromatous plaque with heavy calcification causing over 50% stenosis at the right renal ostium. Symmetric renal enhancement. No aneurysm or dissection seen IMA: Patent Inflow: Heavily calcified atheromatous plaque. At least 50% narrowing at proximal right common iliac artery. No dissection or aneurysm Veins: Unremarkable in the arterial phase Review of the MIP images confirms the above findings. NON-VASCULAR Hepatobiliary: No focal liver abnormality.No evidence of biliary obstruction or stone. Pancreas: Unremarkable. Spleen: Unremarkable. Adrenals/Urinary Tract: Negative adrenals. No hydronephrosis or stone. Bilateral renal cortical thinning. Simple cyst at the lower pole left kidney measuring 2.3 cm. No follow-up imaging is recommended. Unremarkable bladder. Stomach/Bowel:  No obstruction. No appendicitis. Lymphatic: No mass or adenopathy. Reproductive:No pathologic findings. Other: No ascites or pneumoperitoneum. Musculoskeletal: No acute abnormalities. Review of the MIP images confirms the above findings. IMPRESSION: 1. No acute finding including evidence of acute aortic syndrome. 2. 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 Electronically Signed    By: Tiburcio Pea M.D.   On: 01/08/2023 04:36   DG Chest 2 View  Result Date: 01/08/2023 CLINICAL DATA:  Chest pain EXAM: CHEST - 2 VIEW COMPARISON:  09/26/2022 FINDINGS: Cardiac shadow is stable. Aortic calcifications are noted. Dialysis catheter is seen in satisfactory position. Lungs  are clear bilaterally. No bony abnormality is noted. IMPRESSION: No active cardiopulmonary disease. Electronically Signed   By: Alcide Clever M.D.   On: 01/08/2023 00:53     The results of significant diagnostics from this hospitalization (including imaging, microbiology, ancillary and laboratory) are listed below for reference.    Labs: BNP (last 3 results) Recent Labs    05/04/22 1308  BNP 2,226.4*   Basic Metabolic Panel: Recent Labs  Lab 01/08/23 0024 01/08/23 0305  NA 135  --   K 3.8  --   CL 89*  --   CO2 28  --   GLUCOSE 117*  --   BUN 34*  --   CREATININE 12.07*  --   CALCIUM 9.4  --   MG  --  2.6*   Liver Function Tests: Recent Labs  Lab 01/08/23 0305  AST 14*  ALT 11  ALKPHOS 151*  BILITOT 0.7  PROT 7.8  ALBUMIN 3.0*   Recent Labs  Lab 01/08/23 0305  LIPASE 22   No results for input(s): "AMMONIA" in the last 168 hours. CBC: Recent Labs  Lab 01/08/23 0024  WBC 6.9  HGB 9.6*  HCT 29.6*  MCV 100.3*  PLT 172   CBG: Recent Labs  Lab 01/08/23 0639  GLUCAP 78   Hgb A1c Recent Labs    01/08/23 0024  HGBA1C 6.0*   Urinalysis    Component Value Date/Time   COLORURINE YELLOW 04/23/2016 2336   APPEARANCEUR CLEAR 04/23/2016 2336   LABSPEC 1.012 04/23/2016 2336   PHURINE 5.0 04/23/2016 2336   GLUCOSEU 50 (A) 04/23/2016 2336   HGBUR SMALL (A) 04/23/2016 2336   BILIRUBINUR NEGATIVE 04/23/2016 2336   KETONESUR NEGATIVE 04/23/2016 2336   PROTEINUR >=300 (A) 04/23/2016 2336   UROBILINOGEN 0.2 01/11/2015 1740   NITRITE NEGATIVE 04/23/2016 2336   LEUKOCYTESUR NEGATIVE 04/23/2016 2336   Sepsis Labs Recent Labs  Lab 01/08/23 0024  WBC 6.9    Microbiology No results found for this or any previous visit (from the past 240 hour(s)).   Time coordinating discharge: Over 30 minutes  SIGNED:   Azucena Fallen, DO [facility] 01/08/2023, 7:15 AM Pager   If 7PM-7AM, please contact night-coverage [email or web address]

## 2023-01-08 NOTE — ED Notes (Signed)
ED TO INPATIENT HANDOFF REPORT  ED Nurse Name and Phone #: Alycia Rossetti [phone]  S Name/Age/Gender Cody Webster 57 y.o. male Room/Bed: 032C/032C  Code Status   Code Status: DNR  Home/SNF/Other Home Patient oriented to: self, place, time, and situation Is this baseline? Yes   Triage Complete: Triage complete  Chief Complaint Chest pain [R07.9]  Triage Note Pt to ED from home c/o L sided "kidney pain". Pt is a dialysis pt (MWF), last had dialysis yesterday but states this wasn't a full treatment due to an issue with his port. Pt also states that the stress of the pain is giving him chest pain too.   Allergies Allergies  Allergen Reactions   Morphine And Codeine Shortness Of Breath and Other (See Comments)    Hallucination  Tolerates Norco/Vicodin    Level of Care/Admitting Diagnosis ED Disposition     ED Disposition  Admit   Condition  --   Comment  Hospital Area: [facility] [[id number]]  Level of Care: Telemetry Cardiac [[id number]]  May place patient in observation at [facility] or [facility] if equivalent level of care is available:: No  Covid Evaluation: Asymptomatic - no recent exposure (last 10 days) testing not required  Diagnosis: Chest pain [454098]  Admitting Physician: Angie Fava [[id number]]  Attending Physician: Angie Fava [[id number]]          B Medical/Surgery History Past Medical History:  Diagnosis Date   Anemia    CAD (coronary artery disease)    stent to RCA in May '24   Carotid artery occlusion    Chronic combined systolic (congestive) and diastolic (congestive) heart failure (HCC)    a. 06/2014 Echo: EF 50%; b. 07/2017 Echo: EF 55-60%; c. 07/2020 Echo: EF 50-55%; d. 04/2022 Echo: EF 45-50%, basal-mid anterolateral and basal to mid inf HK. Mild conc LVH, GrII DD. NL RV fxn. RVSP 51.73mmHg. Sev dil LA. Mildly dil RA. MIld-mod MR.   Depression    ESRD (end stage renal disease) (HCC)    a. 04/2022 - initiated HD-->MWF   GERD  (gastroesophageal reflux disease)    Hypertensive heart disease    Ischemic cardiomyopathy    a. 06/2014 Echo: EF 50%; b. 07/2017 Echo: EF 55-60%; d. 04/2018 MV: EF 47%, moderate inf ischemia; e. 04/2022 Echo: EF 45-50%.   Latent tuberculosis    Peripheral neuropathy    Pneumonia    PTSD (post-traumatic stress disorder)    Seizures (HCC)    27 years ago   Stroke [facility])    a. 04/2022 MRI Brain: small acute infarct - R posterior pons and subcortical L cerebral hemisphere. Chronic sm vessel dzs.   Tobacco abuse    Type II diabetes mellitus (HCC)    type 2   Past Surgical History:  Procedure Laterality Date   AV FISTULA PLACEMENT Left 11/08/2016   Procedure: ARTERIOVENOUS (AV) FISTULA CREATION;  Surgeon: Chuck Hint, MD;  Location: [facility] OR;  Service: Vascular;  Laterality: Left;   AV FISTULA PLACEMENT Left 03/23/2020   Procedure: LEFT BASILIC VEIN FISTULA CREATION;  Surgeon: Chuck Hint, MD;  Location: [facility] OR;  Service: Vascular;  Laterality: Left;   CORONARY LITHOTRIPSY N/A 09/27/2022   Procedure: CORONARY LITHOTRIPSY;  Surgeon: [name], MD;  Location[facility] INVASIVE CV LAB;  Service: Cardiovascular;  Laterality: N/A;   CORONARY STENT INTERVENTION N/A 09/27/2022   Procedure: CORONARY STENT INTERVENTION;  Surgeon: [name], MD;  Location: [facility] INVASIVE CV LAB;  Service: Cardiovascular;  Laterality:  N/A;   IR FLUORO GUIDE CV LINE RIGHT  05/10/2022   IR US GUIDE VASC ACCESS RIGHT  05/10/2022   LEFT HEART CATH AND CORONARY ANGIOGRAPHY N/A 09/27/2022   Procedure: LEFT HEART CATH AND CORONARY ANGIOGRAPHY;  Surgeon: [name], MD;  Location: [facility] INVASIVE CV LAB;  Service: Cardiovascular;  Laterality: N/A;   NM MYOVIEW LTD  07/2017   "Moderate sized moderate severity" (on cardiology was very small size, small intensity) partially reversible inferoapical//inferoseptal defect.  (Read as intermediate-high risk) -> over read by cardiology as LOW RISK   NM MYOVIEW LTD   09/2019   [facility] Small sized, mild severity reversible defect involving apical lateral & mid anterolateral wall thought to be artifacts - but CRO mild ischemia.  EF 55%. => Read as LOW RISK:   right foot surgery     TOE AMPUTATION     TRANSTHORACIC ECHOCARDIOGRAM  07/2017   EF 60%.,  Moderate LVH.  GR 1 DD.  No R WMA.  Mild RV and moderate RA dilation.   TRANSTHORACIC ECHOCARDIOGRAM  10/03/2019   [facility]: EF 40%, mild MR, mild aortic calcification     A IV Location/Drains/Wounds Patient Lines/Drains/Airways Status     Active Line/Drains/Airways     Name Placement date Placement time Site Days   Peripheral IV 01/08/23 20 G Right Antecubital 01/08/23  0316  Antecubital  less than 1   Fistula / Graft Left Upper arm Arteriovenous fistula 11/08/16  1115  Upper arm  2252   Fistula / Graft Left Upper arm Arteriovenous fistula 03/23/20  0839  Upper arm  1021   Hemodialysis Catheter Right Internal jugular Double lumen Permanent (Tunneled) 05/10/22  1308  Internal jugular  243   Wound / Incision (Open or Dehisced) 01/11/15 Diabetic ulcer Foot Left;Posterior;Lateral diabetic ulcer dry and crusty pink open wound non draining 01/11/15  2130  Foot  2919   Wound / Incision (Open or Dehisced) 04/24/16 Diabetic ulcer Foot Left diabetic ulcer to sole of left foot 04/24/16  1122  Foot  2450            Intake/Output Last 24 hours No intake or output data in the 24 hours ending 01/08/23 0536  Labs/Imaging Results for orders placed or performed during the hospital encounter of 01/07/23 (from the past 48 hour(s))  Basic metabolic panel     Status: Abnormal   Collection Time: 01/08/23 12:24 AM  Result Value Ref Range   Sodium 135 135 - 145 mmol/L   Potassium 3.8 3.5 - 5.1 mmol/L   Chloride 89 (L) 98 - 111 mmol/L   CO2 28 22 - 32 mmol/L   Glucose, Bld 117 (H) 70 - 99 mg/dL    Comment: Glucose reference range applies only to samples taken after fasting for at least 8 hours.   BUN 34 (H)  6 - 20 mg/dL   Creatinine, Ser 32.35 (H) 0.61 - 1.24 mg/dL   Calcium 9.4 8.9 - 57.3 mg/dL   GFR, Estimated 4 (L) >60 mL/min    Comment: (NOTE) Calculated using the CKD-EPI Creatinine Equation (2021)    Anion gap 18 (H) 5 - 15    Comment: Performed at [facility], [address], [place], [place] [address]  CBC     Status: Abnormal   Collection Time: 01/08/23 12:24 AM  Result Value Ref Range   WBC 6.9 4.0 - 10.5 K/uL   RBC 2.95 (L) 4.22 - 5.81 MIL/uL   Hemoglobin 9.6 (L) 13.0 -  17.0 g/dL   HCT 54.0 (L) 98.1 - 19.1 %   MCV 100.3 (H) 80.0 - 100.0 fL   MCH 32.5 26.0 - 34.0 pg   MCHC 32.4 30.0 - 36.0 g/dL   RDW 47.8 29.5 - 62.1 %   Platelets 172 150 - 400 K/uL   nRBC 0.0 0.0 - 0.2 %    Comment: Performed at [facility], [address] [place], [place] [address]  Troponin I (High Sensitivity)     Status: Abnormal   Collection Time: 01/08/23 12:24 AM  Result Value Ref Range   Troponin I (High Sensitivity) 38 (H) <18 ng/L    Comment: (NOTE) Elevated high sensitivity troponin I (hsTnI) values and significant  changes across serial measurements may suggest ACS but many other  chronic and acute conditions are known to elevate hsTnI results.  Refer to the "Links" section for chest pain algorithms and additional  guidance. Performed at [facility], [address], [place], [place] [address]   Troponin I (High Sensitivity)     Status: Abnormal   Collection Time: 01/08/23  2:54 AM  Result Value Ref Range   Troponin I (High Sensitivity) 37 (H) <18 ng/L    Comment: (NOTE) Elevated high sensitivity troponin I (hsTnI) values and significant  changes across serial measurements may suggest ACS but many other  chronic and acute conditions are known to elevate hsTnI results.  Refer to the "Links" section for chest pain algorithms and additional  guidance. Performed at [facility], [address] [place], [place] [address]   Hepatic function panel     Status:  Abnormal   Collection Time: 01/08/23  3:05 AM  Result Value Ref Range   Total Protein 7.8 6.5 - 8.1 g/dL   Albumin 3.0 (L) 3.5 - 5.0 g/dL   AST 14 (L) 15 - 41 U/L   ALT 11 0 - 44 U/L   Alkaline Phosphatase 151 (H) 38 - 126 U/L   Total Bilirubin 0.7 0.3 - 1.2 mg/dL   Bilirubin, Direct 0.2 0.0 - 0.2 mg/dL   Indirect Bilirubin 0.5 0.3 - 0.9 mg/dL    Comment: Performed at [facility], [address], [place], [place] [address]  Lipase, blood     Status: None   Collection Time: 01/08/23  3:05 AM  Result Value Ref Range   Lipase 22 11 - 51 U/L    Comment: Performed at [facility], [address] [place], [place] [address]   CT Angio Chest/Abd/Pel for Dissection W and/or Wo Contrast  Result Date: 01/08/2023 CLINICAL DATA:  Acute aortic syndrome suspected. EXAM: CT ANGIOGRAPHY CHEST, ABDOMEN AND PELVIS TECHNIQUE: Non-contrast CT of the chest was initially obtained. Multidetector CT imaging through the chest, abdomen and pelvis was performed using the standard protocol during bolus administration of intravenous contrast. Multiplanar reconstructed images and MIPs were obtained and reviewed to evaluate the vascular anatomy. RADIATION DOSE REDUCTION: This exam was performed according to the departmental dose-optimization program which includes automated exposure control, adjustment of the mA and/or kV according to patient size and/or use of iterative reconstruction technique. CONTRAST:  75mL OMNIPAQUE IOHEXOL 350 MG/ML SOLN COMPARISON:  10/22/2022 FINDINGS: CTA CHEST FINDINGS Cardiovascular: Preferential opacification of the thoracic aorta. No evidence of thoracic aortic aneurysm or dissection. Normal heart size. No pericardial effusion. Confluent atheromatous calcification of the aorta and great vessels. Extensive atheromatous coronary calcification. Mediastinum/Nodes: Negative for hematoma or adenopathy Lungs/Pleura: Generalized airway thickening. There is  no edema, consolidation, effusion,  or pneumothorax. Calcified granuloma in the right lower lobe Musculoskeletal: Gynecomastia.  No acute finding. Review of the MIP images confirms the above findings. CTA ABDOMEN AND PELVIS FINDINGS VASCULAR Aorta: Diffuse atheromatous wall thickening. No aneurysm or dissection. 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 No branch occlusion or aneurysm SMA: Calcified plaque but no flow reducing stenosis. No branch occlusion Renals: Diffuse atheromatous plaque with heavy calcification causing over 50% stenosis at the right renal ostium. Symmetric renal enhancement. No aneurysm or dissection seen IMA: Patent Inflow: Heavily calcified atheromatous plaque. At least 50% narrowing at proximal right common iliac artery. No dissection or aneurysm Veins: Unremarkable in the arterial phase Review of the MIP images confirms the above findings. NON-VASCULAR Hepatobiliary: No focal liver abnormality.No evidence of biliary obstruction or stone. Pancreas: Unremarkable. Spleen: Unremarkable. Adrenals/Urinary Tract: Negative adrenals. No hydronephrosis or stone. Bilateral renal cortical thinning. Simple cyst at the lower pole left kidney measuring 2.3 cm. No follow-up imaging is recommended. Unremarkable bladder. Stomach/Bowel:  No obstruction. No appendicitis. Lymphatic: No mass or adenopathy. Reproductive:No pathologic findings. Other: No ascites or pneumoperitoneum. Musculoskeletal: No acute abnormalities. Review of the MIP images confirms the above findings. IMPRESSION: 1. No acute finding including evidence of acute aortic syndrome. 2. 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 Electronically Signed   By: Tiburcio Pea M.D.   On: 01/08/2023 04:36   DG Chest 2 View  Result Date: 01/08/2023 CLINICAL DATA:  Chest pain EXAM: CHEST - 2 VIEW COMPARISON:  09/26/2022 FINDINGS: Cardiac shadow is stable. Aortic  calcifications are noted. Dialysis catheter is seen in satisfactory position. Lungs are clear bilaterally. No bony abnormality is noted. IMPRESSION: No active cardiopulmonary disease. Electronically Signed   By: Alcide Clever M.D.   On: 01/08/2023 00:53    Pending Labs Unresulted Labs (From admission, onward)     Start     Ordered   01/08/23 0529  Magnesium  Add-on,   AD        01/08/23 0528   01/08/23 0305  Urinalysis, Routine w reflex microscopic -Urine, Clean Catch  Once,   URGENT       Question:  Specimen Source  Answer:  Urine, Clean Catch   01/08/23 0304            Vitals/Pain Today's Vitals   01/08/23 0450 01/08/23 0500 01/08/23 0515 01/08/23 0530  BP: (!) 159/80 (!) 142/72 (!) 139/95 (!) 174/85  Pulse: 95 94 97 85  Resp: 20 10 (!) 22 18  Temp:      TempSrc:      SpO2: 96% 95% 95% 100%  PainSc:        Isolation Precautions No active isolations  Medications Medications  acetaminophen (TYLENOL) tablet 650 mg (has no administration in time range)    Or  acetaminophen (TYLENOL) suppository 650 mg (has no administration in time range)  naloxone (NARCAN) injection 0.4 mg (has no administration in time range)  HYDROmorphone (DILAUDID) injection 0.5 mg (has no administration in time range)  HYDROmorphone (DILAUDID) injection 1 mg (1 mg Intravenous Given 01/08/23 0318)  iohexol (OMNIPAQUE) 350 MG/ML injection 75 mL (75 mLs Intravenous Contrast Given 01/08/23 0415)  HYDROmorphone (DILAUDID) injection 1 mg (1 mg Intravenous Given 01/08/23 0434)  aspirin chewable tablet 324 mg (324 mg Oral Given 01/08/23 0513)    Mobility walks     Focused Assessments Renal Assessment Handoff:  Hemodialysis Schedule: Hemodialysis Schedule: Monday/Wednesday/Friday Last Hemodialysis date and time:    Restricted appendage: left arm   R Recommendations: See Admitting Provider Note  Report given to:   Additional Notes: Pleasant gentleman here for left flank and chest pain. Alert,  oriented, ambulatory.

## 2023-01-08 NOTE — ED Provider Notes (Signed)
[place] [facility] AT [facility] Provider Note   CSN: [id number] Arrival date & time: 01/07/23  2351     History  Chief Complaint  Patient presents with   Flank Pain    Cody Webster is a 57 y.o. male.  Patient with a history of ESRD on dialysis presenting with left-sided "kidney pain" for the past 4 days.  Denies any fall or trauma.  States the pain is constant and nothing makes it better or worse.  No radiation of the pain down his bladder down his leg.  Taking Tylenol without relief.  Does take Percocet as well but statesit is not helping.  Does have chronic pain issues States he does not make much urine.  No pain with urination or blood in the urine.  Has had 2 episodes of vomiting today.  No fever.  States the pain in his back is so bad it is causing him chest pain as well.  He is saying the chest "is worse when it's cold" and is better when is not cold.  Pain last for several seconds to minutes at a time.  He is concerned he could be having a heart attack.  States he has had a heart attack in the past but has no stents.  States the pain in his chest comes and goes and feels like he could be having a heart attack.  Some shortness of breath and nausea and vomiting.  No cough or fever. Pain is not exertional, not pleuritic but radiates to his left arm at times lasting for few seconds to a few minutes.   Chest pain is nonexertional, nonpleuritic, nonpositional, not reproducible to palpation over the anterior chest wall.   No weakness or numbness in his legs.  The history is provided by the patient.  Flank Pain Associated symptoms include abdominal pain and shortness of breath. Pertinent negatives include no chest pain and no headaches.       Home Medications Prior to Admission medications   Medication Sig Start Date End Date Taking? Authorizing Provider  acetaminophen (TYLENOL) 500 MG tablet Take 1,000 mg by mouth every 6 (six) hours as needed (for  pain/headaches.).    [provider]  aspirin EC 81 MG tablet Take 1 tablet (81 mg total) by mouth daily. Swallow whole. 10/06/22   Cannon Kettle, PA-C  atorvastatin (LIPITOR) 80 MG tablet Take 1 tablet (80 mg total) by mouth daily. 10/06/22 01/04/23  Cannon Kettle, PA-C  benzonatate (TESSALON) 100 MG capsule Take 1-2 caps PO TID PRN 09/29/22   Mayers, Cari S, PA-C  cetirizine (ZYRTEC) 10 MG tablet TAKE 1 TABLET (10 MG TOTAL) BY MOUTH DAILY. 07/04/22   Grayce Sessions, NP  cloNIDine (CATAPRES) 0.1 MG tablet TAKE ONE (1) TABLET BY MOUTH THREE TIMES DAILY. Patient not taking: Reported on 09/29/2022 07/04/22   Grayce Sessions, NP  clopidogrel (PLAVIX) 75 MG tablet Take 1 tablet (75 mg total) by mouth daily. 10/06/22   Cannon Kettle, PA-C  fluticasone (FLONASE) 50 MCG/ACT nasal spray Place 2 sprays into both nostrils daily. 08/25/20   Grayce Sessions, NP  gabapentin (NEURONTIN) 300 MG capsule TAKE 1 CAPSULE (300 MG TOTAL) BY MOUTH 2 (TWO) TIMES DAILY. 11/29/22   Nadara Mustard, MD  guaiFENesin (MUCINEX) 600 MG 12 hr tablet Take 1 tablet (600 mg total) by mouth 2 (two) times daily as needed for to loosen phlegm. 10/01/20   Grayce Sessions, NP  isoniazid (NYDRAZID) 300  MG tablet Take 1 tablet (300 mg total) by mouth daily. 05/12/22   Hughie Closs, MD  isosorbide mononitrate (IMDUR) 30 MG 24 hr tablet Take 1 tablet (30 mg total) by mouth daily. 10/06/22 01/04/23  Cannon Kettle, PA-C  metoprolol succinate (TOPROL XL) 25 MG 24 hr tablet Take 1 tablet (25 mg total) by mouth daily. 10/06/22   Cannon Kettle, PA-C  mirtazapine (REMERON) 7.5 MG tablet Take 1 tablet (7.5 mg total) by mouth at bedtime. Patient not taking: Reported on 05/27/2022 05/12/22 06/11/22  Hughie Closs, MD  nicotine (NICOTINE STEP 1) 21 mg/24hr patch Place 1 patch (21 mg total) onto the skin daily. 09/29/22   Mayers, Cari S, PA-C  nitroGLYCERIN (NITROSTAT) 0.4 MG SL tablet Place 1 tablet (0.4 mg total)  under the tongue every 5 (five) minutes as needed for chest pain (CP or SOB). 07/22/17   Albertine Grates, MD  oxyCODONE-acetaminophen (PERCOCET) 10-325 MG tablet Take 1 tablet by mouth every 6 (six) hours as needed for pain. 10/22/22   Charlynne Pander, MD  oxyCODONE-acetaminophen (PERCOCET/ROXICET) 5-325 MG tablet Take 1 tablet by mouth every 6 (six) hours as needed for severe pain. 10/21/22   Jeanelle Malling, PA  pyridOXINE (VITAMIN B6) 50 MG tablet Take 1 tablet (50 mg total) by mouth daily. 03/29/22   Gardiner Barefoot, MD  traZODone (DESYREL) 100 MG tablet TAKE 1/2 TO 1 TABLET BY MOUTH AT BEDTIME AS NEEDED FOR SLEEP. 06/04/22   Grayce Sessions, NP      Allergies    Morphine and codeine    Review of Systems   Review of Systems  Constitutional:  Negative for activity change, appetite change and fever.  HENT:  Negative for congestion and rhinorrhea.   Respiratory:  Positive for chest tightness and shortness of breath.   Cardiovascular:  Negative for chest pain.  Gastrointestinal:  Positive for abdominal pain, nausea and vomiting.  Genitourinary:  Positive for flank pain. Negative for dysuria.  Musculoskeletal:  Positive for back pain.  Skin:  Negative for wound.  Neurological:  Negative for dizziness, weakness and headaches.   all other systems are negative except as noted in the HPI and PMH.    Physical Exam Updated Vital Signs BP (!) 153/62   Pulse 85   Temp 98 F (36.7 C) (Oral)   Resp (!) 27   SpO2 100%  Physical Exam Vitals and nursing note reviewed.  Constitutional:      General: He is not in acute distress.    Appearance: He is well-developed.  HENT:     Head: Normocephalic and atraumatic.     Mouth/Throat:     Pharynx: No oropharyngeal exudate.  Eyes:     Conjunctiva/sclera: Conjunctivae normal.     Pupils: Pupils are equal, round, and reactive to light.  Neck:     Comments: No meningismus. Cardiovascular:     Rate and Rhythm: Normal rate and regular rhythm.     Heart sounds:  Normal heart sounds. No murmur heard. Pulmonary:     Effort: Pulmonary effort is normal. No respiratory distress.     Breath sounds: Normal breath sounds.  Chest:     Chest wall: No tenderness.  Abdominal:     Palpations: Abdomen is soft.     Tenderness: There is no abdominal tenderness. There is no guarding or rebound.  Musculoskeletal:        General: Tenderness present. Normal range of motion.     Cervical back: Normal range of  motion and neck supple.     Comments: L CVAT  5/5 strength in lower extremities  Skin:    General: Skin is warm.     Capillary Refill: Capillary refill takes less than 2 seconds.     Findings: No rash.  Neurological:     General: No focal deficit present.     Mental Status: He is alert and oriented to person, place, and time. Mental status is at baseline.     Cranial Nerves: No cranial nerve deficit.     Motor: No abnormal muscle tone.     Coordination: Coordination normal.     Comments:  5/5 strength throughout. CN 2-12 intact.Equal grip strength.   Psychiatric:        Behavior: Behavior normal.     ED Results / Procedures / Treatments   Labs (all labs ordered are listed, but only abnormal results are displayed) Labs Reviewed  BASIC METABOLIC PANEL - Abnormal; Notable for the following components:      Result Value   Chloride 89 (*)    Glucose, Bld 117 (*)    BUN 34 (*)    Creatinine, Ser 12.07 (*)    GFR, Estimated 4 (*)    Anion gap 18 (*)    All other components within normal limits  CBC - Abnormal; Notable for the following components:   RBC 2.95 (*)    Hemoglobin 9.6 (*)    HCT 29.6 (*)    MCV 100.3 (*)    All other components within normal limits  HEPATIC FUNCTION PANEL - Abnormal; Notable for the following components:   Albumin 3.0 (*)    AST 14 (*)    Alkaline Phosphatase 151 (*)    All other components within normal limits  HEMOGLOBIN A1C - Abnormal; Notable for the following components:   Hgb A1c MFr Bld 6.0 (*)    All  other components within normal limits  MAGNESIUM - Abnormal; Notable for the following components:   Magnesium 2.6 (*)    All other components within normal limits  CBC WITH DIFFERENTIAL/PLATELET - Abnormal; Notable for the following components:   RBC 3.34 (*)    Hemoglobin 10.8 (*)    HCT 33.5 (*)    MCV 100.3 (*)    All other components within normal limits  COMPREHENSIVE METABOLIC PANEL - Abnormal; Notable for the following components:   Sodium 134 (*)    Chloride 89 (*)    Glucose, Bld 101 (*)    BUN 35 (*)    Creatinine, Ser 12.68 (*)    Albumin 3.1 (*)    AST 11 (*)    Alkaline Phosphatase 158 (*)    GFR, Estimated 4 (*)    Anion gap 19 (*)    All other components within normal limits  TROPONIN I (HIGH SENSITIVITY) - Abnormal; Notable for the following components:   Troponin I (High Sensitivity) 38 (*)    All other components within normal limits  TROPONIN I (HIGH SENSITIVITY) - Abnormal; Notable for the following components:   Troponin I (High Sensitivity) 37 (*)    All other components within normal limits  TROPONIN I (HIGH SENSITIVITY) - Abnormal; Notable for the following components:   Troponin I (High Sensitivity) 48 (*)    All other components within normal limits  MRSA NEXT GEN BY PCR, NASAL  LIPASE, BLOOD  PROTIME-INR  GLUCOSE, CAPILLARY  URINALYSIS, ROUTINE W REFLEX MICROSCOPIC    EKG EKG Interpretation Date/Time:  Sunday January 08 2023  02:43:27 EDT Ventricular Rate:  87 PR Interval:  166 QRS Duration:  92 QT Interval:  394 QTC Calculation: 474 R Axis:   71  Text Interpretation: Sinus rhythm Probable anteroseptal infarct, old Nonspecific repol abnormality, lateral leads No significant change was found Confirmed by Glynn Octave [phone]) on 01/08/2023 3:04:00 AM  Radiology CT Angio Chest/Abd/Pel for Dissection W and/or Wo Contrast  Result Date: 01/08/2023 CLINICAL DATA:  Acute aortic syndrome suspected. EXAM: CT ANGIOGRAPHY CHEST, ABDOMEN AND PELVIS  TECHNIQUE: Non-contrast CT of the chest was initially obtained. Multidetector CT imaging through the chest, abdomen and pelvis was performed using the standard protocol during bolus administration of intravenous contrast. Multiplanar reconstructed images and MIPs were obtained and reviewed to evaluate the vascular anatomy. RADIATION DOSE REDUCTION: This exam was performed according to the departmental dose-optimization program which includes automated exposure control, adjustment of the mA and/or kV according to patient size and/or use of iterative reconstruction technique. CONTRAST:  75mL OMNIPAQUE IOHEXOL 350 MG/ML SOLN COMPARISON:  10/22/2022 FINDINGS: CTA CHEST FINDINGS Cardiovascular: Preferential opacification of the thoracic aorta. No evidence of thoracic aortic aneurysm or dissection. Normal heart size. No pericardial effusion. Confluent atheromatous calcification of the aorta and great vessels. Extensive atheromatous coronary calcification. Mediastinum/Nodes: Negative for hematoma or adenopathy Lungs/Pleura: Generalized airway thickening. There is no edema, consolidation, effusion, or pneumothorax. Calcified granuloma in the right lower lobe Musculoskeletal: Gynecomastia.  No acute finding. Review of the MIP images confirms the above findings. CTA ABDOMEN AND PELVIS FINDINGS VASCULAR Aorta: Diffuse atheromatous wall thickening. No aneurysm or dissection. 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 No branch occlusion or aneurysm SMA: Calcified plaque but no flow reducing stenosis. No branch occlusion Renals: Diffuse atheromatous plaque with heavy calcification causing over 50% stenosis at the right renal ostium. Symmetric renal enhancement. No aneurysm or dissection seen IMA: Patent Inflow: Heavily calcified atheromatous plaque. At least 50% narrowing at proximal right common iliac artery. No dissection or aneurysm Veins: Unremarkable in the arterial phase Review of  the MIP images confirms the above findings. NON-VASCULAR Hepatobiliary: No focal liver abnormality.No evidence of biliary obstruction or stone. Pancreas: Unremarkable. Spleen: Unremarkable. Adrenals/Urinary Tract: Negative adrenals. No hydronephrosis or stone. Bilateral renal cortical thinning. Simple cyst at the lower pole left kidney measuring 2.3 cm. No follow-up imaging is recommended. Unremarkable bladder. Stomach/Bowel:  No obstruction. No appendicitis. Lymphatic: No mass or adenopathy. Reproductive:No pathologic findings. Other: No ascites or pneumoperitoneum. Musculoskeletal: No acute abnormalities. Review of the MIP images confirms the above findings. IMPRESSION: 1. No acute finding including evidence of acute aortic syndrome. 2. 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 Electronically Signed   By: Tiburcio Pea M.D.   On: 01/08/2023 04:36   DG Chest 2 View  Result Date: 01/08/2023 CLINICAL DATA:  Chest pain EXAM: CHEST - 2 VIEW COMPARISON:  09/26/2022 FINDINGS: Cardiac shadow is stable. Aortic calcifications are noted. Dialysis catheter is seen in satisfactory position. Lungs are clear bilaterally. No bony abnormality is noted. IMPRESSION: No active cardiopulmonary disease. Electronically Signed   By: Alcide Clever M.D.   On: 01/08/2023 00:53    Procedures Procedures    Medications Ordered in ED Medications  HYDROmorphone (DILAUDID) injection 1 mg (has no administration in time range)   LHC May 2024 1.  Severe calcific ostial/proximal RCA stenosis of 90%, treated with balloon lithotripsy and coronary stenting using a 2.75 x 16 mm Synergy DES, reducing the  stenosis to 0% 2.  Moderate residual coronary stenoses in the mid RCA, mid LAD, and mid circumflex, all likely of borderline hemodynamic significance with diffuse vessel calcification and atherosclerotic disease.  Recommend medical therapy. 3.  Severe  calcification of the aortic root and ascending aorta   ED Course/ Medical Decision Making/ A&P                                 Medical Decision Making Amount and/or Complexity of Data Reviewed Labs: ordered. Decision-making details documented in ED Course. Radiology: ordered and independent interpretation performed. Decision-making details documented in ED Course. ECG/medicine tests: ordered and independent interpretation performed. Decision-making details documented in ED Course.  Risk OTC drugs. Prescription drug management. Decision regarding hospitalization.   Left-sided flank pain for the past 4 days that is constant.  No focal weakness, numbness or tingling.  Intermittent chest pain for the past several days as well.  EKG shows no acute ischemia.  Left back pain appears to be chronic in origin.  No focal weakness, numbness or tingling.  No incontinence.  Low suspicion for cord compression or cauda equina.  Troponin minimally elevated but flat. Chest pain is atypical for ACS but HEART score is 5 with known CAD.   CTA negative for acute aortic pathology. No AAA or dissection. Results reviewed and interpreted by me. Does show known celiac and renal blood flow but presentation not consistent with  mesenteric ischemia. Similar findings in June and has not followed up with vascular surgery.  Patient still with atypical chest pain, seems atypical for ACS. Troponin flat, but known CAD with elevated heart score, will plan observation admission.   D/w Dr. Arlean Hopping.           Final Clinical Impression(s) / ED Diagnoses Final diagnoses:  Chest pain, unspecified type  Flank pain  ESRD (end stage renal disease) (HCC)    Rx / DC Orders ED Discharge Orders     None         Kyleena Scheirer, Jeannett Senior, MD 01/08/23 [phone]

## 2023-01-08 NOTE — ED Triage Notes (Signed)
Pt to ED from home c/o L sided "kidney pain". Pt is a dialysis pt (MWF), last had dialysis yesterday but states this wasn't a full treatment due to an issue with his port. Pt also states that the stress of the pain is giving him chest pain too.

## 2023-01-09 ENCOUNTER — Other Ambulatory Visit: Payer: Self-pay

## 2023-01-09 ENCOUNTER — Emergency Department (HOSPITAL_COMMUNITY)
Admission: EM | Admit: 2023-01-09 | Discharge: 2023-01-09 | Disposition: A | Discharge status: Home / Self Care | Payer: Medicaid Other | Attending: Emergency Medicine | Admitting: Emergency Medicine

## 2023-01-09 ENCOUNTER — Encounter (HOSPITAL_COMMUNITY): Payer: Self-pay

## 2023-01-09 ENCOUNTER — Telehealth: Payer: Self-pay

## 2023-01-09 DIAGNOSIS — Z7982 Long term (current) use of aspirin: Secondary | ICD-10-CM | POA: Insufficient documentation

## 2023-01-09 DIAGNOSIS — Z992 Dependence on renal dialysis: Secondary | ICD-10-CM | POA: Diagnosis not present

## 2023-01-09 DIAGNOSIS — M546 Pain in thoracic spine: Secondary | ICD-10-CM | POA: Insufficient documentation

## 2023-01-09 DIAGNOSIS — N186 End stage renal disease: Secondary | ICD-10-CM | POA: Insufficient documentation

## 2023-01-09 DIAGNOSIS — Z7902 Long term (current) use of antithrombotics/antiplatelets: Secondary | ICD-10-CM | POA: Diagnosis not present

## 2023-01-09 LAB — COMPREHENSIVE METABOLIC PANEL
ALT: 8 U/L (ref 0–44)
AST: 11 U/L — ABNORMAL LOW (ref 15–41)
Albumin: 3 g/dL — ABNORMAL LOW (ref 3.5–5.0)
Alkaline Phosphatase: 144 U/L — ABNORMAL HIGH (ref 38–126)
Anion gap: 19 — ABNORMAL HIGH (ref 5–15)
BUN: 50 mg/dL — ABNORMAL HIGH (ref 6–20)
CO2: 25 mmol/L (ref 22–32)
Calcium: 8.4 mg/dL — ABNORMAL LOW (ref 8.9–10.3)
Chloride: 89 mmol/L — ABNORMAL LOW (ref 98–111)
Creatinine, Ser: 15.08 mg/dL — ABNORMAL HIGH (ref 0.61–1.24)
GFR, Estimated: 3 mL/min — ABNORMAL LOW (ref >60)
Glucose, Bld: 79 mg/dL (ref 70–99)
Potassium: 4.6 mmol/L (ref 3.5–5.1)
Sodium: 133 mmol/L — ABNORMAL LOW (ref 135–145)
Total Bilirubin: 0.8 mg/dL (ref 0.3–1.2)
Total Protein: 7.4 g/dL (ref 6.5–8.1)

## 2023-01-09 LAB — CBC
HCT: 33.1 % — ABNORMAL LOW (ref 39.0–52.0)
Hemoglobin: 10.5 g/dL — ABNORMAL LOW (ref 13.0–17.0)
MCH: 32.2 pg (ref 26.0–34.0)
MCHC: 31.7 g/dL (ref 30.0–36.0)
MCV: 101.5 fL — ABNORMAL HIGH (ref 80.0–100.0)
Platelets: 192 10*3/uL (ref 150–400)
RBC: 3.26 MIL/uL — ABNORMAL LOW (ref 4.22–5.81)
RDW: 14.9 % (ref 11.5–15.5)
WBC: 8.9 10*3/uL (ref 4.0–10.5)
nRBC: 0 % (ref 0.0–0.2)

## 2023-01-09 MED ORDER — HYDROMORPHONE HCL 1 MG/ML IJ SOLN
1.0000 mg | Freq: Once | INTRAMUSCULAR | Status: AC
  Administered 2023-01-09: 1 mg via INTRAMUSCULAR
  Filled 2023-01-09: qty 1

## 2023-01-09 MED ORDER — HYDROMORPHONE HCL 1 MG/ML IJ SOLN
2.0000 mg | Freq: Once | INTRAMUSCULAR | Status: AC
  Administered 2023-01-09: 2 mg via INTRAMUSCULAR
  Filled 2023-01-09: qty 2

## 2023-01-09 MED ORDER — HYDROMORPHONE HCL 2 MG PO TABS
2.0000 mg | ORAL_TABLET | Freq: Three times a day (TID) | ORAL | 0 refills | Status: DC | PRN
Start: 2023-01-09 — End: 2023-01-11

## 2023-01-09 MED ORDER — ONDANSETRON 4 MG PO TBDP
4.0000 mg | ORAL_TABLET | Freq: Once | ORAL | Status: AC
  Administered 2023-01-09: 4 mg via ORAL
  Filled 2023-01-09: qty 1

## 2023-01-09 NOTE — Discharge Instructions (Signed)
Please follow-up for your next regularly scheduled dialysis session.  We gave you a stronger pain medication called Dilaudid today.  This is an opioid medicine.  Please be aware that you should absolutely not take this medication at the same time as your oxycodone, or any other narcotics.  Mixing these medications together puts you at risk of overdose.  You should reserve this medicine for severe, breakthrough pain.  Ultimately, however, you should see your primary care provider or your pain management clinic about your ongoing back pain issues.

## 2023-01-09 NOTE — ED Notes (Signed)
Pt and caregiver given instructions on medication sent to pharmacy.  Pt aware that he cannot take this prescription with any other narcotic as this could lead to an overdose.  Pt and caregiver given time to ask questions to this RN and Dahlia Client, Charity fundraiser.  No questions at this time.  Pt aware that he must follow up with dialysis regularly.

## 2023-01-09 NOTE — ED Triage Notes (Signed)
Pt c.o left sided "kidney  pain" for the past 4 days. Pt has not had dialysis since last Monday. Pt eloped yesterday.

## 2023-01-09 NOTE — ED Provider Notes (Signed)
[place] EMERGENCY DEPARTMENT AT [facility] Provider Note   CSN: [id number] Arrival date & time: 01/09/23  1454     History  Chief Complaint  Patient presents with   Back Pain    Terril Junkins is a 57 y.o. male with history of end-stage renal disease on dialysis, chronic back pain, presented to ED with worsening back pain.  Patient reports this is his chronic back issue, for which she already takes oxycodone 10 mg tablets, but says that these medications are "not even touching my pain".  He reports severe pain, inability to perform his daily functions including sleeping at night.  He says Dilaudid has helped in the hospital.  Of note the patient was seen 2 days ago in the hospital, at that time was complaining of chest pain, and had negative troponins but was admitted for "chest pain observation".  He left AMA, and reports that he left because "I have PTSD and I do not like being here".  He denies that he is having any active chest pain at this time.  He is insistent that his back pain is his ongoing chronic pain.  He says he has missed dialysis since Monday, due to his ongoing pain and inability to sit still in the dialysis chair.  He denies shortness of breath.  On his prior visit to the ED 2 days ago he did CT dissection study performed of the chest abdomen pelvis with no acute dissection, no emergent findings to explain his symptoms, no noted renal stones.  HPI     Home Medications Prior to Admission medications   Medication Sig Start Date End Date Taking? Authorizing Provider  HYDROmorphone (DILAUDID) 2 MG tablet Take 1 tablet (2 mg total) by mouth every 8 (eight) hours as needed for up to 10 doses for severe pain. 01/09/23  Yes Samiel Peel, Kermit Balo, MD  acetaminophen (TYLENOL) 500 MG tablet Take 1,000 mg by mouth every 6 (six) hours as needed (for pain/headaches.).    [provider]  aspirin EC 81 MG tablet Take 1 tablet (81 mg total) by mouth daily. Swallow  whole. 10/06/22   Cannon Kettle, PA-C  atorvastatin (LIPITOR) 80 MG tablet Take 1 tablet (80 mg total) by mouth daily. 10/06/22 01/04/23  Cannon Kettle, PA-C  AURYXIA 1 GM 210 MG(Fe) tablet Take 210 mg by mouth 3 (three) times daily with meals. 12/16/22   [provider]  benzonatate (TESSALON) 100 MG capsule Take 1-2 caps PO TID PRN 09/29/22   Mayers, Cari S, PA-C  cetirizine (ZYRTEC) 10 MG tablet TAKE 1 TABLET (10 MG TOTAL) BY MOUTH DAILY. 07/04/22   Grayce Sessions, NP  cloNIDine (CATAPRES) 0.1 MG tablet TAKE ONE (1) TABLET BY MOUTH THREE TIMES DAILY. Patient not taking: Reported on 09/29/2022 07/04/22   Grayce Sessions, NP  clopidogrel (PLAVIX) 75 MG tablet Take 1 tablet (75 mg total) by mouth daily. 10/06/22   Cannon Kettle, PA-C  fluticasone (FLONASE) 50 MCG/ACT nasal spray Place 2 sprays into both nostrils daily. 08/25/20   Grayce Sessions, NP  gabapentin (NEURONTIN) 300 MG capsule TAKE 1 CAPSULE (300 MG TOTAL) BY MOUTH 2 (TWO) TIMES DAILY. 11/29/22   Nadara Mustard, MD  guaiFENesin (MUCINEX) 600 MG 12 hr tablet Take 1 tablet (600 mg total) by mouth 2 (two) times daily as needed for to loosen phlegm. 10/01/20   Grayce Sessions, NP  isoniazid (NYDRAZID) 300 MG tablet Take 1 tablet (300 mg total) by  mouth daily. 05/12/22   Hughie Closs, MD  isosorbide mononitrate (IMDUR) 30 MG 24 hr tablet Take 1 tablet (30 mg total) by mouth daily. 10/06/22 01/04/23  Cannon Kettle, PA-C  methocarbamol (ROBAXIN) 500 MG tablet Take 500 mg by mouth 2 (two) times daily as needed. 11/29/22   [provider]  metoprolol succinate (TOPROL XL) 25 MG 24 hr tablet Take 1 tablet (25 mg total) by mouth daily. 10/06/22   Cannon Kettle, PA-C  mirtazapine (REMERON) 7.5 MG tablet Take 1 tablet (7.5 mg total) by mouth at bedtime. Patient not taking: Reported on 05/27/2022 05/12/22 06/11/22  Hughie Closs, MD  nicotine (NICOTINE STEP 1) 21 mg/24hr patch Place 1 patch (21 mg total)  onto the skin daily. 09/29/22   Mayers, Cari S, PA-C  nitroGLYCERIN (NITROSTAT) 0.4 MG SL tablet Place 1 tablet (0.4 mg total) under the tongue every 5 (five) minutes as needed for chest pain (CP or SOB). 07/22/17   Albertine Grates, MD  oxyCODONE-acetaminophen (PERCOCET) 10-325 MG tablet Take 1 tablet by mouth every 6 (six) hours as needed for pain. 10/22/22   Charlynne Pander, MD  oxyCODONE-acetaminophen (PERCOCET/ROXICET) 5-325 MG tablet Take 1 tablet by mouth every 6 (six) hours as needed for severe pain. 10/21/22   Jeanelle Malling, PA  pyridOXINE (VITAMIN B6) 50 MG tablet Take 1 tablet (50 mg total) by mouth daily. 03/29/22   Gardiner Barefoot, MD  traZODone (DESYREL) 100 MG tablet TAKE 1/2 TO 1 TABLET BY MOUTH AT BEDTIME AS NEEDED FOR SLEEP. 06/04/22   Grayce Sessions, NP      Allergies    Morphine and codeine    Review of Systems   Review of Systems  Physical Exam Updated Vital Signs BP (!) 165/69   Pulse 86   Temp 97.6 F (36.4 C)   Resp 19   SpO2 99%  Physical Exam Constitutional:      General: He is in acute distress.  HENT:     Head: Normocephalic and atraumatic.  Eyes:     Conjunctiva/sclera: Conjunctivae normal.     Pupils: Pupils are equal, round, and reactive to light.  Cardiovascular:     Rate and Rhythm: Normal rate and regular rhythm.  Pulmonary:     Effort: Pulmonary effort is normal. No respiratory distress.  Abdominal:     General: There is no distension.     Tenderness: There is no abdominal tenderness.  Skin:    General: Skin is warm and dry.  Neurological:     General: No focal deficit present.     Mental Status: He is alert. Mental status is at baseline.  Psychiatric:        Mood and Affect: Mood normal.        Behavior: Behavior normal.     ED Results / Procedures / Treatments   Labs (all labs ordered are listed, but only abnormal results are displayed) Labs Reviewed  CBC - Abnormal; Notable for the following components:      Result Value   RBC 3.26 (*)     Hemoglobin 10.5 (*)    HCT 33.1 (*)    MCV 101.5 (*)    All other components within normal limits  COMPREHENSIVE METABOLIC PANEL - Abnormal; Notable for the following components:   Sodium 133 (*)    Chloride 89 (*)    BUN 50 (*)    Creatinine, Ser 15.08 (*)    Calcium 8.4 (*)    Albumin 3.0 (*)  AST 11 (*)    Alkaline Phosphatase 144 (*)    GFR, Estimated 3 (*)    Anion gap 19 (*)    All other components within normal limits    EKG EKG Interpretation Date/Time:  Monday January 09 2023 15:07:22 EDT Ventricular Rate:  87 PR Interval:  162 QRS Duration:  88 QT Interval:  408 QTC Calculation: 490 R Axis:   41  Text Interpretation: Normal sinus rhythm Septal infarct , age undetermined When compared with ECG of 08-Jan-2023 02:43, PREVIOUS ECG IS PRESENT Confirmed by Alvester Chou [phone]) on 01/09/2023 3:52:04 PM  Radiology CT Angio Chest/Abd/Pel for Dissection W and/or Wo Contrast  Result Date: 01/08/2023 CLINICAL DATA:  Acute aortic syndrome suspected. EXAM: CT ANGIOGRAPHY CHEST, ABDOMEN AND PELVIS TECHNIQUE: Non-contrast CT of the chest was initially obtained. Multidetector CT imaging through the chest, abdomen and pelvis was performed using the standard protocol during bolus administration of intravenous contrast. Multiplanar reconstructed images and MIPs were obtained and reviewed to evaluate the vascular anatomy. RADIATION DOSE REDUCTION: This exam was performed according to the departmental dose-optimization program which includes automated exposure control, adjustment of the mA and/or kV according to patient size and/or use of iterative reconstruction technique. CONTRAST:  75mL OMNIPAQUE IOHEXOL 350 MG/ML SOLN COMPARISON:  10/22/2022 FINDINGS: CTA CHEST FINDINGS Cardiovascular: Preferential opacification of the thoracic aorta. No evidence of thoracic aortic aneurysm or dissection. Normal heart size. No pericardial effusion. Confluent atheromatous calcification of the aorta and  great vessels. Extensive atheromatous coronary calcification. Mediastinum/Nodes: Negative for hematoma or adenopathy Lungs/Pleura: Generalized airway thickening. There is no edema, consolidation, effusion, or pneumothorax. Calcified granuloma in the right lower lobe Musculoskeletal: Gynecomastia.  No acute finding. Review of the MIP images confirms the above findings. CTA ABDOMEN AND PELVIS FINDINGS VASCULAR Aorta: Diffuse atheromatous wall thickening. No aneurysm or dissection. 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 No branch occlusion or aneurysm SMA: Calcified plaque but no flow reducing stenosis. No branch occlusion Renals: Diffuse atheromatous plaque with heavy calcification causing over 50% stenosis at the right renal ostium. Symmetric renal enhancement. No aneurysm or dissection seen IMA: Patent Inflow: Heavily calcified atheromatous plaque. At least 50% narrowing at proximal right common iliac artery. No dissection or aneurysm Veins: Unremarkable in the arterial phase Review of the MIP images confirms the above findings. NON-VASCULAR Hepatobiliary: No focal liver abnormality.No evidence of biliary obstruction or stone. Pancreas: Unremarkable. Spleen: Unremarkable. Adrenals/Urinary Tract: Negative adrenals. No hydronephrosis or stone. Bilateral renal cortical thinning. Simple cyst at the lower pole left kidney measuring 2.3 cm. No follow-up imaging is recommended. Unremarkable bladder. Stomach/Bowel:  No obstruction. No appendicitis. Lymphatic: No mass or adenopathy. Reproductive:No pathologic findings. Other: No ascites or pneumoperitoneum. Musculoskeletal: No acute abnormalities. Review of the MIP images confirms the above findings. IMPRESSION: 1. No acute finding including evidence of acute aortic syndrome. 2. 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 Electronically Signed   By:  Tiburcio Pea M.D.   On: 01/08/2023 04:36   DG Chest 2 View  Result Date: 01/08/2023 CLINICAL DATA:  Chest pain EXAM: CHEST - 2 VIEW COMPARISON:  09/26/2022 FINDINGS: Cardiac shadow is stable. Aortic calcifications are noted. Dialysis catheter is seen in satisfactory position. Lungs are clear bilaterally. No bony abnormality is noted. IMPRESSION: No active cardiopulmonary disease. Electronically Signed   By: Alcide Clever M.D.   On: 01/08/2023 00:53    Procedures Procedures  Medications Ordered in ED Medications  HYDROmorphone (DILAUDID) injection 2 mg (2 mg Intramuscular Given 01/09/23 1706)  HYDROmorphone (DILAUDID) injection 1 mg (1 mg Intramuscular Given 01/09/23 1834)  ondansetron (ZOFRAN-ODT) disintegrating tablet 4 mg (4 mg Oral Given 01/09/23 1835)    ED Course/ Medical Decision Making/ A&P                                 Medical Decision Making Amount and/or Complexity of Data Reviewed Labs: ordered.  Risk Prescription drug management.   Patient is presenting with severe midthoracic back pain, worse with movement, which I suspect is more likely related to a lower thoracic radiculopathy or facet disease and an acute intra-abdominal process.  He appears to be here strictly for pain control perspective.  I reviewed his external records including his ED workup and brief hospitalization stay 2 days ago, for which he left AMA or eloped.  He had a CT scan at that time which showed no emergent findings, as a dissection study protocol, and I do not see an indication to repeat the scan at this time.  I doubt PE, ACS, or intra-abdominal surgical or infectious emergency. Troponins were flat 2 days ago, 37 -> 48, and he has no active chest pain at this time to suspect ACS.  I doubt acute renal stone.  Reviewed and interpreted the patient's labs.  Labs appear to be at baseline levels.  Some minor increase of his anion gap, potassium within normal limits.  I do not see an emergent  indication for dialysis.  He is not in respiratory distress and not hypoxic.  I do believe he would be stable to follow-up for his neck scheduled dialysis session.  Patient was given IM Dilaudid here and reassessed, with some improvement of pain.  Patient is requesting oral Dilaudid tablets, as this medication appears to be only thing that is controlling his pain at home.  I did explain that the milligram tablets are likely equivocal to the 10 mg oxycodone tablet that he is taking, but I will provide 10 tablets to take PRN - but NOT to take at same time as oxycodone.  The patient and his caregiver at bedside are both clear about this.  He will need to follow-up with pain management clinic.  At this time there is no indication for hospitalization.        Final Clinical Impression(s) / ED Diagnoses Final diagnoses:  Thoracic back pain, unspecified back pain laterality, unspecified chronicity    Rx / DC Orders ED Discharge Orders          Ordered    HYDROmorphone (DILAUDID) 2 MG tablet  Every 8 hours PRN       Note to Pharmacy: PDMP reviewed, aware of additional narcotic prescriptions - patient counseled on pain regimen   01/09/23 1832              Terald Sleeper, MD 01/09/23 [phone]

## 2023-01-09 NOTE — Transitions of Care (Post Inpatient/ED Visit) (Signed)
   01/09/2023  Name: Cody Webster MRN: [id number] DOB: 31-Aug-1965  Today's TOC FU Call Status: Today's TOC FU Call Status:: Unsuccessful Call (1st Attempt) Unsuccessful Call (1st Attempt) Date: 01/09/23  Attempted to reach the patient regarding the most recent Inpatient/ED visit.  Follow Up Plan: Additional outreach attempts will be made to reach the patient to complete the Transitions of Care (Post Inpatient/ED visit) call.   Signature Robyne Peers, RN

## 2023-01-10 ENCOUNTER — Telehealth: Payer: Self-pay

## 2023-01-10 NOTE — Transitions of Care (Post Inpatient/ED Visit) (Signed)
   01/10/2023  Name: Cody Webster MRN: [id number] DOB: [birth year]  Today's TOC FU Call Status: Today's TOC FU Call Status:: Unsuccessful Call (2nd Attempt) Unsuccessful Call (1st Attempt) Date: 01/09/23 Unsuccessful Call (2nd Attempt) Date: 01/10/23  Attempted to reach the patient regarding the most recent Inpatient/ED visit.  Follow Up Plan: Additional outreach attempts will be made to reach the patient to complete the Transitions of Care (Post Inpatient/ED visit) call.   Signature  Robyne Peers, RN

## 2023-01-11 ENCOUNTER — Other Ambulatory Visit (HOSPITAL_COMMUNITY): Payer: Medicaid Other

## 2023-01-11 ENCOUNTER — Telehealth: Payer: Self-pay

## 2023-01-11 ENCOUNTER — Other Ambulatory Visit: Payer: Self-pay

## 2023-01-11 ENCOUNTER — Observation Stay (HOSPITAL_COMMUNITY)
Admission: EM | Admit: 2023-01-11 | Discharge: 2023-01-12 | Disposition: A | Discharge status: Home / Self Care | Payer: Medicaid Other | Attending: Internal Medicine | Admitting: Internal Medicine

## 2023-01-11 ENCOUNTER — Emergency Department (HOSPITAL_COMMUNITY): Payer: Medicaid Other

## 2023-01-11 ENCOUNTER — Encounter (HOSPITAL_COMMUNITY): Payer: Self-pay

## 2023-01-11 DIAGNOSIS — Z79899 Other long term (current) drug therapy: Secondary | ICD-10-CM | POA: Insufficient documentation

## 2023-01-11 DIAGNOSIS — Z91199 Patient's noncompliance with other medical treatment and regimen due to unspecified reason: Secondary | ICD-10-CM

## 2023-01-11 DIAGNOSIS — Z7982 Long term (current) use of aspirin: Secondary | ICD-10-CM | POA: Diagnosis not present

## 2023-01-11 DIAGNOSIS — E1122 Type 2 diabetes mellitus with diabetic chronic kidney disease: Secondary | ICD-10-CM | POA: Diagnosis not present

## 2023-01-11 DIAGNOSIS — M545 Low back pain, unspecified: Secondary | ICD-10-CM

## 2023-01-11 DIAGNOSIS — I25110 Atherosclerotic heart disease of native coronary artery with unstable angina pectoris: Secondary | ICD-10-CM

## 2023-01-11 DIAGNOSIS — G8929 Other chronic pain: Secondary | ICD-10-CM

## 2023-01-11 DIAGNOSIS — Z765 Malingerer [conscious simulation]: Secondary | ICD-10-CM | POA: Insufficient documentation

## 2023-01-11 DIAGNOSIS — I2510 Atherosclerotic heart disease of native coronary artery without angina pectoris: Secondary | ICD-10-CM | POA: Diagnosis not present

## 2023-01-11 DIAGNOSIS — Z992 Dependence on renal dialysis: Secondary | ICD-10-CM | POA: Insufficient documentation

## 2023-01-11 DIAGNOSIS — I5020 Unspecified systolic (congestive) heart failure: Secondary | ICD-10-CM

## 2023-01-11 DIAGNOSIS — F17200 Nicotine dependence, unspecified, uncomplicated: Secondary | ICD-10-CM

## 2023-01-11 DIAGNOSIS — E1169 Type 2 diabetes mellitus with other specified complication: Secondary | ICD-10-CM

## 2023-01-11 DIAGNOSIS — Z955 Presence of coronary angioplasty implant and graft: Secondary | ICD-10-CM | POA: Insufficient documentation

## 2023-01-11 DIAGNOSIS — N186 End stage renal disease: Secondary | ICD-10-CM | POA: Diagnosis not present

## 2023-01-11 DIAGNOSIS — F17210 Nicotine dependence, cigarettes, uncomplicated: Secondary | ICD-10-CM | POA: Diagnosis not present

## 2023-01-11 DIAGNOSIS — I132 Hypertensive heart and chronic kidney disease with heart failure and with stage 5 chronic kidney disease, or end stage renal disease: Secondary | ICD-10-CM | POA: Diagnosis not present

## 2023-01-11 DIAGNOSIS — I5042 Chronic combined systolic (congestive) and diastolic (congestive) heart failure: Secondary | ICD-10-CM | POA: Diagnosis not present

## 2023-01-11 DIAGNOSIS — I214 Non-ST elevation (NSTEMI) myocardial infarction: Principal | ICD-10-CM | POA: Diagnosis present

## 2023-01-11 DIAGNOSIS — Z8673 Personal history of transient ischemic attack (TIA), and cerebral infarction without residual deficits: Secondary | ICD-10-CM | POA: Insufficient documentation

## 2023-01-11 DIAGNOSIS — E785 Hyperlipidemia, unspecified: Secondary | ICD-10-CM

## 2023-01-11 DIAGNOSIS — R079 Chest pain, unspecified: Secondary | ICD-10-CM | POA: Diagnosis present

## 2023-01-11 LAB — GLUCOSE, CAPILLARY: Glucose-Capillary: 105 mg/dL — ABNORMAL HIGH (ref 70–99)

## 2023-01-11 LAB — CBC WITH DIFFERENTIAL/PLATELET
Abs Immature Granulocytes: 0.04 10*3/uL (ref 0.00–0.07)
Basophils Absolute: 0 10*3/uL (ref 0.0–0.1)
Basophils Relative: 0 %
Eosinophils Absolute: 0.3 10*3/uL (ref 0.0–0.5)
Eosinophils Relative: 4 %
HCT: 28.9 % — ABNORMAL LOW (ref 39.0–52.0)
Hemoglobin: 9.2 g/dL — ABNORMAL LOW (ref 13.0–17.0)
Immature Granulocytes: 1 %
Lymphocytes Relative: 9 %
Lymphs Abs: 0.7 10*3/uL (ref 0.7–4.0)
MCH: 32.4 pg (ref 26.0–34.0)
MCHC: 31.8 g/dL (ref 30.0–36.0)
MCV: 101.8 fL — ABNORMAL HIGH (ref 80.0–100.0)
Monocytes Absolute: 0.5 10*3/uL (ref 0.1–1.0)
Monocytes Relative: 7 %
Neutro Abs: 6 10*3/uL (ref 1.7–7.7)
Neutrophils Relative %: 79 %
Platelets: 179 10*3/uL (ref 150–400)
RBC: 2.84 MIL/uL — ABNORMAL LOW (ref 4.22–5.81)
RDW: 15.1 % (ref 11.5–15.5)
WBC: 7.6 10*3/uL (ref 4.0–10.5)
nRBC: 0 % (ref 0.0–0.2)

## 2023-01-11 LAB — BASIC METABOLIC PANEL
Anion gap: 24 — ABNORMAL HIGH (ref 5–15)
BUN: 60 mg/dL — ABNORMAL HIGH (ref 6–20)
CO2: 20 mmol/L — ABNORMAL LOW (ref 22–32)
Calcium: 8.1 mg/dL — ABNORMAL LOW (ref 8.9–10.3)
Chloride: 91 mmol/L — ABNORMAL LOW (ref 98–111)
Creatinine, Ser: 15.57 mg/dL — ABNORMAL HIGH (ref 0.61–1.24)
GFR, Estimated: 3 mL/min — ABNORMAL LOW (ref >60)
Glucose, Bld: 96 mg/dL (ref 70–99)
Potassium: 4.9 mmol/L (ref 3.5–5.1)
Sodium: 135 mmol/L (ref 135–145)

## 2023-01-11 LAB — HEPARIN LEVEL (UNFRACTIONATED): Heparin Unfractionated: 0.42 [IU]/mL (ref 0.30–0.70)

## 2023-01-11 LAB — TROPONIN I (HIGH SENSITIVITY)
Troponin I (High Sensitivity): 236 ng/L (ref <18)
Troponin I (High Sensitivity): 243 ng/L (ref <18)

## 2023-01-11 MED ORDER — INSULIN ASPART 100 UNIT/ML IJ SOLN: 0.0000 [IU] | Freq: Three times a day (TID) | INTRAMUSCULAR | Status: DC

## 2023-01-11 MED ORDER — CLOPIDOGREL BISULFATE 75 MG PO TABS
75.0000 mg | ORAL_TABLET | Freq: Every day | ORAL | Status: DC
  Administered 2023-01-11 – 2023-01-12 (×2): 75 mg via ORAL
  Filled 2023-01-11 (×2): qty 1

## 2023-01-11 MED ORDER — OXYCODONE-ACETAMINOPHEN 5-325 MG PO TABS: 1.0000 | ORAL_TABLET | Freq: Four times a day (QID) | ORAL | Status: DC | PRN

## 2023-01-11 MED ORDER — HEPARIN (PORCINE) 25000 UT/250ML-% IV SOLN
1100.0000 [IU]/h | INTRAVENOUS | Status: DC
  Administered 2023-01-11: 1100 [IU]/h via INTRAVENOUS
  Filled 2023-01-11: qty 250

## 2023-01-11 MED ORDER — HYDROMORPHONE HCL 1 MG/ML IJ SOLN
1.0000 mg | Freq: Once | INTRAMUSCULAR | Status: AC
  Administered 2023-01-11: 1 mg via INTRAVENOUS
  Filled 2023-01-11: qty 1

## 2023-01-11 MED ORDER — ASPIRIN 81 MG PO CHEW
324.0000 mg | CHEWABLE_TABLET | Freq: Once | ORAL | Status: AC
  Administered 2023-01-11: 324 mg via ORAL
  Filled 2023-01-11: qty 4

## 2023-01-11 MED ORDER — OXYCODONE-ACETAMINOPHEN 10-325 MG PO TABS: 1.0000 | ORAL_TABLET | Freq: Four times a day (QID) | ORAL | Status: DC | PRN

## 2023-01-11 MED ORDER — ISOSORBIDE MONONITRATE ER 30 MG PO TB24
30.0000 mg | ORAL_TABLET | Freq: Every day | ORAL | Status: DC
  Administered 2023-01-11 – 2023-01-12 (×2): 30 mg via ORAL
  Filled 2023-01-11 (×2): qty 1

## 2023-01-11 MED ORDER — ONDANSETRON HCL 4 MG/2ML IJ SOLN: 4.0000 mg | Freq: Four times a day (QID) | INTRAMUSCULAR | Status: DC | PRN

## 2023-01-11 MED ORDER — HYDROCODONE-ACETAMINOPHEN 5-325 MG PO TABS
1.0000 | ORAL_TABLET | Freq: Once | ORAL | Status: AC
  Administered 2023-01-11: 1 via ORAL
  Filled 2023-01-11: qty 1

## 2023-01-11 MED ORDER — ACETAMINOPHEN 325 MG PO TABS: 650.0000 mg | ORAL_TABLET | ORAL | Status: DC | PRN

## 2023-01-11 MED ORDER — ATORVASTATIN CALCIUM 80 MG PO TABS
80.0000 mg | ORAL_TABLET | Freq: Every day | ORAL | Status: DC
  Administered 2023-01-11: 80 mg via ORAL
  Filled 2023-01-11: qty 1

## 2023-01-11 MED ORDER — OXYCODONE HCL 5 MG PO TABS
5.0000 mg | ORAL_TABLET | Freq: Four times a day (QID) | ORAL | Status: DC | PRN
  Administered 2023-01-11 – 2023-01-12 (×2): 5 mg via ORAL
  Filled 2023-01-11 (×3): qty 1

## 2023-01-11 MED ORDER — HEPARIN BOLUS VIA INFUSION
4000.0000 [IU] | Freq: Once | INTRAVENOUS | Status: AC
  Administered 2023-01-11: 4000 [IU] via INTRAVENOUS
  Filled 2023-01-11: qty 4000

## 2023-01-11 MED ORDER — NITROGLYCERIN 0.4 MG SL SUBL: 0.4000 mg | SUBLINGUAL_TABLET | SUBLINGUAL | Status: DC | PRN

## 2023-01-11 MED ORDER — ASPIRIN 81 MG PO TBEC
81.0000 mg | DELAYED_RELEASE_TABLET | Freq: Every day | ORAL | Status: DC
  Administered 2023-01-12: 81 mg via ORAL
  Filled 2023-01-11 (×2): qty 1

## 2023-01-11 MED ORDER — METOPROLOL SUCCINATE ER 25 MG PO TB24
25.0000 mg | ORAL_TABLET | Freq: Every day | ORAL | Status: DC
  Administered 2023-01-12: 25 mg via ORAL
  Filled 2023-01-11: qty 1

## 2023-01-11 NOTE — Progress Notes (Signed)
ANTICOAGULATION CONSULT NOTE - Initial Consult  Pharmacy Consult for heparin Indication: chest pain/ACS  Allergies  Allergen Reactions   Morphine And Codeine Shortness Of Breath and Other (See Comments)    Hallucination  Tolerates Norco/Vicodin    Patient Measurements:   Heparin Dosing Weight: TBW  Vital Signs: Temp: 97.9 F (36.6 C) (08/28 1243) Temp Source: Oral (08/28 1243) BP: 129/92 (08/28 1243) Pulse Rate: 85 (08/28 1243)  Labs: Recent Labs    01/09/23 1515 01/11/23 1253  HGB 10.5* 9.2*  HCT 33.1* 28.9*  PLT 192 179  CREATININE 15.08* 15.57*  TROPONINIHS  --  236*    Estimated Creatinine Clearance: 5.8 mL/min (A) (by C-G formula based on SCr of 15.57 mg/dL (H)).   Medical History: Past Medical History:  Diagnosis Date   Anemia    CAD (coronary artery disease)    stent to RCA in May '24   Carotid artery occlusion    Chronic combined systolic (congestive) and diastolic (congestive) heart failure (HCC)    a. 06/2014 Echo: EF 50%; b. 07/2017 Echo: EF 55-60%; c. 07/2020 Echo: EF 50-55%; d. 04/2022 Echo: EF 45-50%, basal-mid anterolateral and basal to mid inf HK. Mild conc LVH, GrII DD. NL RV fxn. RVSP 51.59mmHg. Sev dil LA. Mildly dil RA. MIld-mod MR.   Depression    ESRD (end stage renal disease) (HCC)    a. 04/2022 - initiated HD-->MWF   GERD (gastroesophageal reflux disease)    Hypertensive heart disease    Ischemic cardiomyopathy    a. 06/2014 Echo: EF 50%; b. 07/2017 Echo: EF 55-60%; d. 04/2018 MV: EF 47%, moderate inf ischemia; e. 04/2022 Echo: EF 45-50%.   Latent tuberculosis    Peripheral neuropathy    Pneumonia    PTSD (post-traumatic stress disorder)    Seizures (HCC)    27 years ago   Stroke [facility])    a. 04/2022 MRI Brain: small acute infarct - R posterior pons and subcortical L cerebral hemisphere. Chronic sm vessel dzs.   Tobacco abuse    Type II diabetes mellitus (HCC)    type 2     Assessment: Cody Webster presenting with CP, elevated troponin,  he is not on anticoagulation PTA, chronic anemia of ESRD stable  Goal of Therapy:  Heparin level 0.3-0.7 units/ml Monitor platelets by anticoagulation protocol: Yes   Plan:  Heparin 4000 units IV x 1, and gtt at 1100 units/hr F/u 8 hour heparin level F/u cards eval and recs  Cody Webster, PharmD, [facility] Clinical Pharmacist ED Pharmacist Phone # [phone] 01/11/2023 2:46 PM

## 2023-01-11 NOTE — Transitions of Care (Post Inpatient/ED Visit) (Signed)
   01/11/2023  Name: Cody Webster MRN: [id number] DOB: [birth year]  Today's TOC FU Call Status: Today's TOC FU Call Status:: Successful TOC FU Call Completed Unsuccessful Call (1st Attempt) Date: 01/09/23 Unsuccessful Call (2nd Attempt) Date: 01/10/23 [facility] FU Call Complete Date: 01/11/23 Patient's Name and Date of Birth confirmed.  Transition Care Management Follow-up Telephone Call Date of Discharge: 01/08/23 Discharge Facility: [facility]) Type of Discharge: Inpatient Admission Primary Inpatient Discharge Diagnosis:: chest pain -left AMA   returned to ED 01/09/2023. How have you been since you were released from the hospital?: Same Any questions or concerns?: No  Items Reviewed: Did you receive and understand the discharge instructions provided?: No (He left AMA - no discharge instructions were given) Medications obtained,verified, and reconciled?: No Medications Not Reviewed Reasons:: Other: (He said he doesn't have any meds except for pain pills.) Any new allergies since your discharge?: No Dietary orders reviewed?: No Do you have support at home?: Yes People in Home: other relative(s) Name of Support/Comfort Primary Source: he said he has family that checks on him.  Medications Reviewed Today: Medications Reviewed Today   Medications were not reviewed in this encounter     Home Care and Equipment/Supplies: Were Home Health Services Ordered?: No Any new equipment or medical supplies ordered?: No  Functional Questionnaire: Do you need assistance with bathing/showering or dressing?: No Do you need assistance with meal preparation?: No Do you need assistance with eating?: No Do you have difficulty maintaining continence: No Do you need assistance with getting out of bed/getting out of a chair/moving?: Yes (ambulates with cane or walker) Do you have difficulty managing or taking your medications?: No (he said he is not taking anything exept pain meds.)  Follow up  appointments reviewed: PCP Follow-up appointment confirmed?: Yes Date of PCP follow-up appointment?: 01/12/23 Follow-up Provider: Leone Payor, NP Specialist Hospital Follow-up appointment confirmed?: NA (He attends dialysis M/W/F) Do you need transportation to your follow-up appointment?: No Do you understand care options if your condition(s) worsen?: Yes-patient verbalized understanding    SIGNATURE Robyne Peers, RN

## 2023-01-11 NOTE — Telephone Encounter (Signed)
The patient was offered the option of going to the [facility] today but he said he can't because he needs to go to dialysis and he prefers to see PCP in person

## 2023-01-11 NOTE — ED Provider Notes (Signed)
[place] EMERGENCY DEPARTMENT AT [facility] Provider Note   CSN: [id number] Arrival date & time: 01/11/23  1229     History  Chief Complaint  Patient presents with   Chest Pain    Cody Webster is a 57 y.o. male with a past medical history significant for CAD status post stent to RCA in May 2024, CHF, ESRD on dialysis Monday, Wednesday, Friday, chronic low back pain, GERD, type 2 diabetes who presents to the ED due to back pain and chest pain.  Patient admits to left-sided back pain.  Patient has a history of chronic back pain.  Seen a few days ago for same requesting Dilaudid.  Denies saddle anesthesia, bowel/bladder incontinence, lower extremity numbness/tingling, lower extremity weakness.  No recent trauma.  Denies fever and chills.  Also admits to worsening chest pain.  Was admitted a few days ago for chest pain rule out however, left AMA.  Did have a recent stent placed in May 2024.  Patient states chest pain has worsened over the past few days associated with shortness of breath.  Chest pain is nonexertional and nonpleuritic.  Sent from dialysis center due to chest pain. No previous blood clots. Denies lower extremity edema.   History obtained from patient and past medical records. No interpreter used during encounter.       Home Medications Prior to Admission medications   Medication Sig Start Date End Date Taking? Authorizing Provider  acetaminophen (TYLENOL) 500 MG tablet Take 1,000 mg by mouth every 6 (six) hours as needed (for pain/headaches.).    [provider]  aspirin EC 81 MG tablet Take 1 tablet (81 mg total) by mouth daily. Swallow whole. 10/06/22   Cannon Kettle, PA-C  atorvastatin (LIPITOR) 80 MG tablet Take 1 tablet (80 mg total) by mouth daily. 10/06/22 01/04/23  Cannon Kettle, PA-C  AURYXIA 1 GM 210 MG(Fe) tablet Take 210 mg by mouth 3 (three) times daily with meals. 12/16/22   [provider]  benzonatate (TESSALON) 100 MG  capsule Take 1-2 caps PO TID PRN 09/29/22   Mayers, Cari S, PA-C  cetirizine (ZYRTEC) 10 MG tablet TAKE 1 TABLET (10 MG TOTAL) BY MOUTH DAILY. 07/04/22   Grayce Sessions, NP  cloNIDine (CATAPRES) 0.1 MG tablet TAKE ONE (1) TABLET BY MOUTH THREE TIMES DAILY. Patient not taking: Reported on 09/29/2022 07/04/22   Grayce Sessions, NP  clopidogrel (PLAVIX) 75 MG tablet Take 1 tablet (75 mg total) by mouth daily. 10/06/22   Cannon Kettle, PA-C  fluticasone (FLONASE) 50 MCG/ACT nasal spray Place 2 sprays into both nostrils daily. 08/25/20   Grayce Sessions, NP  gabapentin (NEURONTIN) 300 MG capsule TAKE 1 CAPSULE (300 MG TOTAL) BY MOUTH 2 (TWO) TIMES DAILY. 11/29/22   Nadara Mustard, MD  guaiFENesin (MUCINEX) 600 MG 12 hr tablet Take 1 tablet (600 mg total) by mouth 2 (two) times daily as needed for to loosen phlegm. 10/01/20   Grayce Sessions, NP  HYDROmorphone (DILAUDID) 2 MG tablet Take 1 tablet (2 mg total) by mouth every 8 (eight) hours as needed for up to 10 doses for severe pain. 01/09/23   Terald Sleeper, MD  isoniazid (NYDRAZID) 300 MG tablet Take 1 tablet (300 mg total) by mouth daily. 05/12/22   Hughie Closs, MD  isosorbide mononitrate (IMDUR) 30 MG 24 hr tablet Take 1 tablet (30 mg total) by mouth daily. 10/06/22 01/04/23  Cannon Kettle, PA-C  methocarbamol (ROBAXIN) 500 MG  tablet Take 500 mg by mouth 2 (two) times daily as needed. 11/29/22   [provider]  metoprolol succinate (TOPROL XL) 25 MG 24 hr tablet Take 1 tablet (25 mg total) by mouth daily. 10/06/22   Cannon Kettle, PA-C  mirtazapine (REMERON) 7.5 MG tablet Take 1 tablet (7.5 mg total) by mouth at bedtime. Patient not taking: Reported on 05/27/2022 05/12/22 06/11/22  Hughie Closs, MD  nicotine (NICOTINE STEP 1) 21 mg/24hr patch Place 1 patch (21 mg total) onto the skin daily. 09/29/22   Mayers, Cari S, PA-C  nitroGLYCERIN (NITROSTAT) 0.4 MG SL tablet Place 1 tablet (0.4 mg total) under the tongue  every 5 (five) minutes as needed for chest pain (CP or SOB). 07/22/17   Albertine Grates, MD  oxyCODONE-acetaminophen (PERCOCET) 10-325 MG tablet Take 1 tablet by mouth every 6 (six) hours as needed for pain. 10/22/22   Charlynne Pander, MD  oxyCODONE-acetaminophen (PERCOCET/ROXICET) 5-325 MG tablet Take 1 tablet by mouth every 6 (six) hours as needed for severe pain. 10/21/22   Jeanelle Malling, PA  pyridOXINE (VITAMIN B6) 50 MG tablet Take 1 tablet (50 mg total) by mouth daily. 03/29/22   Gardiner Barefoot, MD  traZODone (DESYREL) 100 MG tablet TAKE 1/2 TO 1 TABLET BY MOUTH AT BEDTIME AS NEEDED FOR SLEEP. 06/04/22   Grayce Sessions, NP      Allergies    Morphine and codeine    Review of Systems   Review of Systems  Constitutional:  Negative for fever.  Respiratory:  Positive for shortness of breath.   Cardiovascular:  Positive for chest pain. Negative for leg swelling.  Gastrointestinal:  Negative for abdominal pain.  Musculoskeletal:  Positive for back pain.    Physical Exam Updated Vital Signs BP (!) 129/92   Pulse 85   Temp 97.9 F (36.6 C) (Oral)   Resp 20   SpO2 99%  Physical Exam Vitals and nursing note reviewed.  Constitutional:      General: He is not in acute distress.    Appearance: He is not ill-appearing.  HENT:     Head: Normocephalic.  Eyes:     Pupils: Pupils are equal, round, and reactive to light.  Cardiovascular:     Rate and Rhythm: Normal rate and regular rhythm.     Pulses: Normal pulses.     Heart sounds: Normal heart sounds. No murmur heard.    No friction rub. No gallop.  Pulmonary:     Effort: Pulmonary effort is normal.     Breath sounds: Normal breath sounds.  Abdominal:     General: Abdomen is flat. There is no distension.     Palpations: Abdomen is soft.     Tenderness: There is no abdominal tenderness. There is no guarding or rebound.  Musculoskeletal:        General: Normal range of motion.     Cervical back: Neck supple.  Skin:    General: Skin is  warm and dry.  Neurological:     General: No focal deficit present.     Mental Status: He is alert.  Psychiatric:        Mood and Affect: Mood normal.        Behavior: Behavior normal.     ED Results / Procedures / Treatments   Labs (all labs ordered are listed, but only abnormal results are displayed) Labs Reviewed  CBC WITH DIFFERENTIAL/PLATELET - Abnormal; Notable for the following components:      Result  Value   RBC 2.84 (*)    Hemoglobin 9.2 (*)    HCT 28.9 (*)    MCV 101.8 (*)    All other components within normal limits  BASIC METABOLIC PANEL - Abnormal; Notable for the following components:   Chloride 91 (*)    CO2 20 (*)    BUN 60 (*)    Creatinine, Ser 15.57 (*)    Calcium 8.1 (*)    GFR, Estimated 3 (*)    Anion gap 24 (*)    All other components within normal limits  TROPONIN I (HIGH SENSITIVITY) - Abnormal; Notable for the following components:   Troponin I (High Sensitivity) 236 (*)    All other components within normal limits  HEPARIN LEVEL (UNFRACTIONATED)  TROPONIN I (HIGH SENSITIVITY)    EKG EKG Interpretation Date/Time:  Wednesday January 11 2023 13:11:45 EDT Ventricular Rate:  89 PR Interval:  178 QRS Duration:  92 QT Interval:  395 QTC Calculation: 481 R Axis:   56  Text Interpretation: Sinus rhythm Probable left atrial enlargement Borderline low voltage, extremity leads Borderline prolonged QT interval No significant change since last tracing Confirmed by Melene Plan [phone]) on 01/11/2023 2:32:37 PM  Radiology DG Chest Portable 1 View  Result Date: 01/11/2023 CLINICAL DATA:  Chest pain. EXAM: PORTABLE CHEST 1 VIEW COMPARISON:  Chest radiograph 01/08/2023. FINDINGS: Unchanged right IJ approach dialysis catheter with tip projecting over the superior cavoatrial junction. Low lung volumes accentuate the pulmonary vasculature and cardiomediastinal silhouette. No consolidation or pulmonary edema. No pleural effusion or pneumothorax. IMPRESSION: Low  lung volumes without evidence of acute cardiopulmonary disease. Electronically Signed   By: Orvan Falconer M.D.   On: 01/11/2023 14:41    Procedures .Critical Care  Performed by: Mannie Stabile, PA-C Authorized by: Mannie Stabile, PA-C   Critical care provider statement:    Critical care time (minutes):  33   Critical care was necessary to treat or prevent imminent or life-threatening deterioration of the following conditions:  Cardiac failure   Critical care was time spent personally by me on the following activities:  Development of treatment plan with patient or surrogate, discussions with consultants, evaluation of patient's response to treatment, examination of patient, ordering and review of laboratory studies, ordering and review of radiographic studies, ordering and performing treatments and interventions, pulse oximetry, re-evaluation of patient's condition and review of old charts   I assumed direction of critical care for this patient from another provider in my specialty: no     Care discussed with: admitting provider       Medications Ordered in ED Medications  heparin bolus via infusion 4,000 Units (has no administration in time range)  heparin ADULT infusion 100 units/mL (25000 units/275mL) (has no administration in time range)  HYDROcodone-acetaminophen (NORCO/VICODIN) 5-325 MG per tablet 1 tablet (has no administration in time range)  HYDROmorphone (DILAUDID) injection 1 mg (1 mg Intravenous Given 01/11/23 1323)    ED Course/ Medical Decision Making/ A&P                                 Medical Decision Making Amount and/or Complexity of Data Reviewed External Data Reviewed: notes. Labs: ordered. Decision-making details documented in ED Course. Radiology: ordered and independent interpretation performed. Decision-making details documented in ED Course. ECG/medicine tests: ordered and independent interpretation performed. Decision-making details documented in  ED Course.  Risk OTC drugs. Prescription drug management. Decision regarding  hospitalization.   This patient presents to the ED for concern of CP, this involves an extensive number of treatment options, and is a complaint that carries with it a high risk of complications and morbidity.  The differential diagnosis includes ACS, PE, aortic dissection, etc  57 year old male presents to the ED due to left-sided back pain and chest pain.  Seen twice over the past few days for same complaint.  Was recently admitted on 8/24 for chest pain rule out however, left AMA.  Patient then seen on 8/26 for thoracic back pain.  Had a full dissection study on 8/24 which was reassuring.  Patient states chest pain has worsened so she was shortness of breath.  Chest pain is nonexertional and nonpositional.  No pleuritic nature.  Patient requesting Dilaudid for his back pain.  Denies saddle anesthesia, bowel/bladder incontinence, lower extremity numbness/tingling, and lower extremity weakness.  Upon arrival, stable vitals.  Patient in no acute distress.  Reassuring physical exam.  Bilateral lower extremities neurovascularly intact.  Low suspicion for cauda equina or central cord compression.  Suspect symptoms likely secondary to chronic back pain.  Dose of Dilaudid given.  Cardiac labs ordered to rule out ACS.  Will hold off on any imaging given patient had a dissection study on 8/24 which was reassuring.  Low suspicion for PE/DVT.  Low suspicion for aortic dissection.  CBC significant for anemia with hemoglobin at 9.2.  Hemoglobin at baseline. No leukocytosis. BMP with elevated creatinine at 15.57 reviewed at 60 consistent with ESRD.  Normal potassium. Anion gap of 24. Elevated troponin at 236. Heparin started.  EKG normal sinus rhythm.  No major changes from previous EKG.  Chest x-ray personally viewed interpreted demonstrates low lung volumes without evidence of acute abnormalities.  Discussed with cardiology who will see  patient. Will discuss with hospitalist for admission.   3:20 PM Discussed with Dr. Maisie Fus with [facility] who agrees to admit patient  Lives at home Hx chronic back pain Has PCP       Final Clinical Impression(s) / ED Diagnoses Final diagnoses:  NSTEMI (non-ST elevated myocardial infarction) [facility])    Rx / DC Orders ED Discharge Orders     None         Jesusita Oka 01/11/23 1521    Melene Plan, DO 01/11/23 1521

## 2023-01-11 NOTE — H&P (Signed)
History and Physical    [place] GUY:[id number] DOB: 10-14-1965 DOA: 01/11/2023  PCP: Grayce Sessions, NP  Patient coming from: home  I have personally briefly reviewed patient's old medical records in [facility]  Chief Complaint: chest pain   HPI: Cody Webster is a 57 y.o. male with medical history significant of   coronary artery disease status post stent to the RCA in May 2024 currently on DAPT, chronic systolic/diastolic heart failure, ESRD  on HD MWF, chronic low back pain, GERD, type 2 diabetes mellitus complicated by diabetic peripheral polyneuropathy, who presents to ED with complaint of  left lower  back pain worse over the last few days. Of note patient has interim history of admission to [facility] 01/08/2023 for CP however prior to work up completed patient signed out AMA. Patient currently states due to lower back pain he is unable to sit to have his HD completed. He notes he went to HD today but could not get it completed. He notes that he has chronic pain in this area however his chronic pain medications are not effective.  IN reference to history of prior chest pain he denies any recurrent of chest pain.    ED Course:  In ED patient on routine testing was found to have increasing trop and with his prior complaint of chest pain 2 days ago , was thought to have has an NSTEMI.  Due to this cardiology was consulted who recommend initiation of heparin drip and further cardiac evaluation.   Vitals : afeb, bp 129/92, hr 85, rr 20 sat 99% Labs Wbc 7.6, hgb 9.2, MC 101.8, plt 179,  Na 135, K 4.9, bicarb 20 Cr 15.57 CE48, 236, 243 EKG:  NSR, LAE Tx dilaudid, asa, norco 5/325, heparin drip   Review of Systems: As per HPI otherwise 10 point review of systems negative.   Past Medical History:  Diagnosis Date   Anemia    CAD (coronary artery disease)    stent to RCA in May '24   Carotid artery occlusion    Chronic combined systolic (congestive) and diastolic (congestive)  heart failure (HCC)    a. 06/2014 Echo: EF 50%; b. 07/2017 Echo: EF 55-60%; c. 07/2020 Echo: EF 50-55%; d. 04/2022 Echo: EF 45-50%, basal-mid anterolateral and basal to mid inf HK. Mild conc LVH, GrII DD. NL RV fxn. RVSP 51.3mmHg. Sev dil LA. Mildly dil RA. MIld-mod MR.   Depression    ESRD (end stage renal disease) (HCC)    a. 04/2022 - initiated HD-->MWF   GERD (gastroesophageal reflux disease)    Hypertensive heart disease    Ischemic cardiomyopathy    a. 06/2014 Echo: EF 50%; b. 07/2017 Echo: EF 55-60%; d. 04/2018 MV: EF 47%, moderate inf ischemia; e. 04/2022 Echo: EF 45-50%.   Latent tuberculosis    Peripheral neuropathy    Pneumonia    PTSD (post-traumatic stress disorder)    Seizures (HCC)    27 years ago   Stroke [facility])    a. 04/2022 MRI Brain: small acute infarct - R posterior pons and subcortical L cerebral hemisphere. Chronic sm vessel dzs.   Tobacco abuse    Type II diabetes mellitus (HCC)    type 2    Past Surgical History:  Procedure Laterality Date   AV FISTULA PLACEMENT Left 11/08/2016   Procedure: ARTERIOVENOUS (AV) FISTULA CREATION;  Surgeon: Chuck Hint, MD;  Location: [facility] OR;  Service: Vascular;  Laterality: Left;   AV FISTULA PLACEMENT Left 03/23/2020  Procedure: LEFT BASILIC VEIN FISTULA CREATION;  Surgeon: Chuck Hint, MD;  Location: [facility] OR;  Service: Vascular;  Laterality: Left;   CORONARY LITHOTRIPSY N/A 09/27/2022   Procedure: CORONARY LITHOTRIPSY;  Surgeon: [name], MD;  Location: [facility] INVASIVE CV LAB;  Service: Cardiovascular;  Laterality: N/A;   CORONARY STENT INTERVENTION N/A 09/27/2022   Procedure: CORONARY STENT INTERVENTION;  Surgeon: [name], MD;  Location: [facility] INVASIVE CV LAB;  Service: Cardiovascular;  Laterality: N/A;   IR FLUORO GUIDE CV LINE RIGHT  05/10/2022   IR US GUIDE VASC ACCESS RIGHT  05/10/2022   LEFT HEART CATH AND CORONARY ANGIOGRAPHY N/A 09/27/2022   Procedure: LEFT HEART CATH AND CORONARY ANGIOGRAPHY;   Surgeon: [name], MD;  Location: [facility] INVASIVE CV LAB;  Service: Cardiovascular;  Laterality: N/A;   NM MYOVIEW LTD  07/2017   "Moderate sized moderate severity" (on cardiology was very small size, small intensity) partially reversible inferoapical//inferoseptal defect.  (Read as intermediate-high risk) -> over read by cardiology as LOW RISK   NM MYOVIEW LTD  09/2019   [facility] Small sized, mild severity reversible defect involving apical lateral & mid anterolateral wall thought to be artifacts - but CRO mild ischemia.  EF 55%. => Read as LOW RISK:   right foot surgery     TOE AMPUTATION     TRANSTHORACIC ECHOCARDIOGRAM  07/2017   EF 60%.,  Moderate LVH.  GR 1 DD.  No R WMA.  Mild RV and moderate RA dilation.   TRANSTHORACIC ECHOCARDIOGRAM  10/03/2019   [facility]: EF 40%, mild MR, mild aortic calcification     reports that he has been smoking cigarettes. He has a 30 pack-year smoking history. He quit smokeless tobacco use about 39 years ago.  His smokeless tobacco use included snuff and chew. He reports that he does not currently use alcohol. He reports that he does not currently use drugs.  Allergies  Allergen Reactions   Morphine And Codeine Shortness Of Breath and Other (See Comments)    Hallucination  Tolerates Norco/Vicodin    Family History  Problem Relation Age of Onset   CAD Mother    Heart attack Mother    Diabetes type II Father    Stroke Father    CAD Father    Hypertension Sister    CAD Sister    Diabetes type II Brother    Colon cancer Maternal Uncle        2015   Colon polyps Neg Hx    Esophageal cancer Neg Hx    Stomach cancer Neg Hx    Rectal cancer Neg Hx     Prior to Admission medications   Medication Sig Start Date End Date Taking? Authorizing Provider  acetaminophen (TYLENOL) 500 MG tablet Take 1,000 mg by mouth every 6 (six) hours as needed (for pain/headaches.).   Yes [provider]  aspirin EC 81 MG tablet Take 1 tablet (81 mg  total) by mouth daily. Swallow whole. Patient taking differently: Take 81 mg by mouth daily. 10/06/22  Yes Juanda Crumble K, PA-C  fluticasone (FLONASE) 50 MCG/ACT nasal spray Place 2 sprays into both nostrils daily. Patient taking differently: Place 2 sprays into both nostrils daily as needed for allergies. 08/25/20  Yes Grayce Sessions, NP  gabapentin (NEURONTIN) 300 MG capsule TAKE 1 CAPSULE (300 MG TOTAL) BY MOUTH 2 (TWO) TIMES DAILY. Patient taking differently: Take 300 mg by mouth 2 (two) times daily as needed (for pain). 11/29/22  Yes Lajoyce Corners,  Randa Evens, MD  nitroGLYCERIN (NITROSTAT) 0.4 MG SL tablet Place 1 tablet (0.4 mg total) under the tongue every 5 (five) minutes as needed for chest pain (CP or SOB). 07/22/17  Yes Albertine Grates, MD  oxyCODONE-acetaminophen (PERCOCET) 10-325 MG tablet Take 1 tablet by mouth every 6 (six) hours as needed for pain. 10/22/22  Yes Charlynne Pander, MD  traZODone (DESYREL) 100 MG tablet TAKE 1/2 TO 1 TABLET BY MOUTH AT BEDTIME AS NEEDED FOR SLEEP. Patient taking differently: Take 50 mg by mouth at bedtime as needed for sleep. 06/04/22  Yes Grayce Sessions, NP  atorvastatin (LIPITOR) 80 MG tablet Take 1 tablet (80 mg total) by mouth daily. 10/06/22 01/04/23  Cannon Kettle, PA-C  isoniazid (NYDRAZID) 300 MG tablet Take 1 tablet (300 mg total) by mouth daily. Patient not taking: Reported on 01/11/2023 05/12/22   Hughie Closs, MD  isosorbide mononitrate (IMDUR) 30 MG 24 hr tablet Take 1 tablet (30 mg total) by mouth daily. 10/06/22 01/04/23  Cannon Kettle, PA-C  metoprolol succinate (TOPROL XL) 25 MG 24 hr tablet Take 1 tablet (25 mg total) by mouth daily. Patient not taking: Reported on 01/11/2023 10/06/22   Cannon Kettle, PA-C  mirtazapine (REMERON) 7.5 MG tablet Take 1 tablet (7.5 mg total) by mouth at bedtime. Patient not taking: Reported on 05/27/2022 05/12/22 06/11/22  Hughie Closs, MD    Physical Exam: Vitals:   01/11/23 1243  BP: (!) 129/92   Pulse: 85  Resp: 20  Temp: 97.9 F (36.6 C)  TempSrc: Oral  SpO2: 99%    Constitutional: NAD, calm, comfortable Vitals:   01/11/23 1243  BP: (!) 129/92  Pulse: 85  Resp: 20  Temp: 97.9 F (36.6 C)  TempSrc: Oral  SpO2: 99%   Eyes: PERRL, lids and conjunctivae normal ENMT: Mucous membranes are moist. Posterior pharynx clear of any exudate or lesions.Normal dentition.  Neck: normal, supple, no masses, no thyromegaly Respiratory: clear to auscultation bilaterally, no wheezing, no crackles. Normal respiratory effort. No accessory muscle use.  Cardiovascular: Regular rate and rhythm, no murmurs / rubs / gallops. No extremity edema. 2+ pedal pulses.  Abdomen: no tenderness, no masses palpated. No hepatosplenomegaly. Bowel sounds positive.  Musculoskeletal: no clubbing / cyanosis. No joint deformity upper and lower extremities. Good ROM, no contractures. Normal muscle tone.  Skin: no rashes, lesions, ulcers. No induration Neurologic: CN 2-12 grossly intact. Sensation intact, l. Strength 5/5 in all 4.  Psychiatric: Normal judgment and insight. Alert and oriented x 3. Normal mood.    Labs on Admission: I have personally reviewed following labs and imaging studies  CBC: Recent Labs  Lab 01/08/23 0024 01/08/23 0713 01/09/23 1515 01/11/23 1253  WBC 6.9 8.7 8.9 7.6  NEUTROABS  --  5.8  --  6.0  HGB 9.6* 10.8* 10.5* 9.2*  HCT 29.6* 33.5* 33.1* 28.9*  MCV 100.3* 100.3* 101.5* 101.8*  PLT 172 189 192 179   Basic Metabolic Panel: Recent Labs  Lab 01/08/23 0024 01/08/23 0305 01/08/23 0713 01/09/23 1515 01/11/23 1253  NA 135  --  134* 133* 135  K 3.8  --  4.0 4.6 4.9  CL 89*  --  89* 89* 91*  CO2 28  --  26 25 20*  GLUCOSE 117*  --  101* 79 96  BUN 34*  --  35* 50* 60*  CREATININE 12.07*  --  12.68* 15.08* 15.57*  CALCIUM 9.4  --  9.4 8.4* 8.1*  MG  --  2.6*  --   --   --  GFR: Estimated Creatinine Clearance: 5.8 mL/min (A) (by C-G formula based on SCr of 15.57  mg/dL (H)). Liver Function Tests: Recent Labs  Lab 01/08/23 0305 01/08/23 0713 01/09/23 1515  AST 14* 11* 11*  ALT 11 12 8   ALKPHOS 151* 158* 144*  BILITOT 0.7 0.6 0.8  PROT 7.8 8.0 7.4  ALBUMIN 3.0* 3.1* 3.0*   Recent Labs  Lab 01/08/23 0305  LIPASE 22   No results for input(s): "AMMONIA" in the last 168 hours. Coagulation Profile: Recent Labs  Lab 01/08/23 0024  INR 1.0   Cardiac Enzymes: No results for input(s): "CKTOTAL", "CKMB", "CKMBINDEX", "TROPONINI" in the last 168 hours. BNP (last 3 results) No results for input(s): "PROBNP" in the last 8760 hours. HbA1C: No results for input(s): "HGBA1C" in the last 72 hours. CBG: Recent Labs  Lab 01/08/23 0639  GLUCAP 78   Lipid Profile: No results for input(s): "CHOL", "HDL", "LDLCALC", "TRIG", "CHOLHDL", "LDLDIRECT" in the last 72 hours. Thyroid Function Tests: No results for input(s): "TSH", "T4TOTAL", "FREET4", "T3FREE", "THYROIDAB" in the last 72 hours. Anemia Panel: No results for input(s): "VITAMINB12", "FOLATE", "FERRITIN", "TIBC", "IRON", "RETICCTPCT" in the last 72 hours. Urine analysis:    Component Value Date/Time   COLORURINE YELLOW 04/23/2016 2336   APPEARANCEUR CLEAR 04/23/2016 2336   LABSPEC 1.012 04/23/2016 2336   PHURINE 5.0 04/23/2016 2336   GLUCOSEU 50 (A) 04/23/2016 2336   HGBUR SMALL (A) 04/23/2016 2336   BILIRUBINUR NEGATIVE 04/23/2016 2336   KETONESUR NEGATIVE 04/23/2016 2336   PROTEINUR >=300 (A) 04/23/2016 2336   UROBILINOGEN 0.2 01/11/2015 1740   NITRITE NEGATIVE 04/23/2016 2336   LEUKOCYTESUR NEGATIVE 04/23/2016 2336    Radiological Exams on Admission: DG Chest Portable 1 View  Result Date: 01/11/2023 CLINICAL DATA:  Chest pain. EXAM: PORTABLE CHEST 1 VIEW COMPARISON:  Chest radiograph 01/08/2023. FINDINGS: Unchanged right IJ approach dialysis catheter with tip projecting over the superior cavoatrial junction. Low lung volumes accentuate the pulmonary vasculature and  cardiomediastinal silhouette. No consolidation or pulmonary edema. No pleural effusion or pneumothorax. IMPRESSION: Low lung volumes without evidence of acute cardiopulmonary disease. Electronically Signed   By: Orvan Falconer M.D.   On: 01/11/2023 14:41    EKG: Independently reviewed. See above  Assessment/Plan NSTEMI  -hx of CAD s/p MI s/p  stent to the RCA in May 2024  -admit to progressive - continue with heparin drip  - continue DAPT, high intensity statin, beta blocker low dose ordered -echo pending  -f/u on cardiology recs -npo midnight for possible further intervention  Chronic Lower back pain  -xray, lumbar back  -supportive care   Severe PAD - most recent CTA notes severe and widespread atherosclerosis with flow reducing narrowing seen at the celiac and right renal artery origins.    ESRD  -HD MWF -nephrology aware   GERD -ppi   DMII - iss /fs   DVT prophylaxis: heparin drip Code Status: .DNR / as discussed per patient wishes in event of cardiac arrest  Family Communication: none at bedside Disposition Plan: patient  expected to be admitted greater than 2 midnights  Consults called:  Cardiology  Admission status: progressive care    Lurline Del MD [facility]   If 7PM-7AM, please contact night-coverage [email or web address] Password [facility]  01/11/2023, 5:06 PM

## 2023-01-11 NOTE — ED Triage Notes (Signed)
Pt arrives via ems from dialysis treatment, chest pain increased there. Missed dialysis last week. Chest pain, flank pain, bilateral shoulder pain x 1 week. VSS per ems. No sob at this time. Pt just started his treatment when pt wanted to come to er

## 2023-01-11 NOTE — ED Notes (Signed)
ED TO INPATIENT HANDOFF REPORT  ED Nurse Name and Phone #: Gustavo Lah, rn [phone]  S Name/Age/Gender Cody Webster 57 y.o. male Room/Bed: 031C/031C  Code Status   Code Status: Full Code  Home/SNF/Other Home Patient oriented to: self, place, time, and situation Is this baseline? Yes   Triage Complete: Triage complete  Chief Complaint NSTEMI (non-ST elevated myocardial infarction) [facility]) [I21.4]  Triage Note Pt arrives via ems from dialysis treatment, chest pain increased there. Missed dialysis last week. Chest pain, flank pain, bilateral shoulder pain x 1 week. VSS per ems. No sob at this time. Pt just started his treatment when pt wanted to come to er    Allergies Allergies  Allergen Reactions   Morphine And Codeine Shortness Of Breath and Other (See Comments)    Hallucination  Tolerates Norco/Vicodin    Level of Care/Admitting Diagnosis ED Disposition     ED Disposition  Admit   Condition  --   Comment  Hospital Area: [facility] [[id number]]  Level of Care: Progressive [102]  Admit to Progressive based on following criteria: CARDIOVASCULAR & THORACIC of moderate stability with acute coronary syndrome symptoms/low risk myocardial infarction/hypertensive urgency/arrhythmias/heart failure potentially compromising stability and stable post cardiovascular intervention patients.  May admit patient to [facility] or [facility] if equivalent level of care is available:: No  Covid Evaluation: Asymptomatic - no recent exposure (last 10 days) testing not required  Diagnosis: NSTEMI (non-ST elevated myocardial infarction) [facility]) [[id number]]  Admitting Physician: Lurline Del [[id number]]  Attending Physician: Lurline Del [[id number]]  Certification:: I certify this patient will need inpatient services for at least 2 midnights  Expected Medical Readiness: 01/14/2023          B Medical/Surgery History Past Medical History:  Diagnosis Date   Anemia     CAD (coronary artery disease)    stent to RCA in May '24   Carotid artery occlusion    Chronic combined systolic (congestive) and diastolic (congestive) heart failure (HCC)    a. 06/2014 Echo: EF 50%; b. 07/2017 Echo: EF 55-60%; c. 07/2020 Echo: EF 50-55%; d. 04/2022 Echo: EF 45-50%, basal-mid anterolateral and basal to mid inf HK. Mild conc LVH, GrII DD. NL RV fxn. RVSP 51.72mmHg. Sev dil LA. Mildly dil RA. MIld-mod MR.   Depression    ESRD (end stage renal disease) (HCC)    a. 04/2022 - initiated HD-->MWF   GERD (gastroesophageal reflux disease)    Hypertensive heart disease    Ischemic cardiomyopathy    a. 06/2014 Echo: EF 50%; b. 07/2017 Echo: EF 55-60%; d. 04/2018 MV: EF 47%, moderate inf ischemia; e. 04/2022 Echo: EF 45-50%.   Latent tuberculosis    Peripheral neuropathy    Pneumonia    PTSD (post-traumatic stress disorder)    Seizures (HCC)    27 years ago   Stroke [facility])    a. 04/2022 MRI Brain: small acute infarct - R posterior pons and subcortical L cerebral hemisphere. Chronic sm vessel dzs.   Tobacco abuse    Type II diabetes mellitus (HCC)    type 2   Past Surgical History:  Procedure Laterality Date   AV FISTULA PLACEMENT Left 11/08/2016   Procedure: ARTERIOVENOUS (AV) FISTULA CREATION;  Surgeon: Chuck Hint, MD;  Location: [facility] OR;  Service: Vascular;  Laterality: Left;   AV FISTULA PLACEMENT Left 03/23/2020   Procedure: LEFT BASILIC VEIN FISTULA CREATION;  Surgeon: Chuck Hint, MD;  Location: [facility] OR;  Service: Vascular;  Laterality:  Left;   CORONARY LITHOTRIPSY N/A 09/27/2022   Procedure: CORONARY LITHOTRIPSY;  Surgeon: [name], MD;  Location: [facility] INVASIVE CV LAB;  Service: Cardiovascular;  Laterality: N/A;   CORONARY STENT INTERVENTION N/A 09/27/2022   Procedure: CORONARY STENT INTERVENTION;  Surgeon: [name], MD;  Location: [facility] INVASIVE CV LAB;  Service: Cardiovascular;  Laterality: N/A;   IR FLUORO GUIDE CV LINE RIGHT  05/10/2022   IR US  GUIDE VASC ACCESS RIGHT  05/10/2022   LEFT HEART CATH AND CORONARY ANGIOGRAPHY N/A 09/27/2022   Procedure: LEFT HEART CATH AND CORONARY ANGIOGRAPHY;  Surgeon: [name], MD;  Location: [facility] INVASIVE CV LAB;  Service: Cardiovascular;  Laterality: N/A;   NM MYOVIEW LTD  07/2017   "Moderate sized moderate severity" (on cardiology was very small size, small intensity) partially reversible inferoapical//inferoseptal defect.  (Read as intermediate-high risk) -> over read by cardiology as LOW RISK   NM MYOVIEW LTD  09/2019   [facility] Small sized, mild severity reversible defect involving apical lateral & mid anterolateral wall thought to be artifacts - but CRO mild ischemia.  EF 55%. => Read as LOW RISK:   right foot surgery     TOE AMPUTATION     TRANSTHORACIC ECHOCARDIOGRAM  07/2017   EF 60%.,  Moderate LVH.  GR 1 DD.  No R WMA.  Mild RV and moderate RA dilation.   TRANSTHORACIC ECHOCARDIOGRAM  10/03/2019   [facility]: EF 40%, mild MR, mild aortic calcification     A IV Location/Drains/Wounds Patient Lines/Drains/Airways Status     Active Line/Drains/Airways     Name Placement date Placement time Site Days   Peripheral IV 01/11/23 20 G Anterior;Distal;Right;Upper Arm 01/11/23  1306  Arm  less than 1   Fistula / Graft Left Upper arm Arteriovenous fistula 11/08/16  1115  Upper arm  2255   Fistula / Graft Left Upper arm Arteriovenous fistula 03/23/20  0839  Upper arm  1024   Hemodialysis Catheter Right Internal jugular Double lumen Permanent (Tunneled) 05/10/22  1308  Internal jugular  246   Wound / Incision (Open or Dehisced) 01/11/15 Diabetic ulcer Foot Left;Posterior;Lateral diabetic ulcer dry and crusty pink open wound non draining 01/11/15  2130  Foot  2922   Wound / Incision (Open or Dehisced) 04/24/16 Diabetic ulcer Foot Left diabetic ulcer to sole of left foot 04/24/16  1122  Foot  2453            Intake/Output Last 24 hours No intake or output data in the 24 hours  ending 01/11/23 1658  Labs/Imaging Results for orders placed or performed during the hospital encounter of 01/11/23 (from the past 48 hour(s))  CBC with Differential     Status: Abnormal   Collection Time: 01/11/23 12:53 PM  Result Value Ref Range   WBC 7.6 4.0 - 10.5 K/uL   RBC 2.84 (L) 4.22 - 5.81 MIL/uL   Hemoglobin 9.2 (L) 13.0 - 17.0 g/dL   HCT 21.3 (L) 08.6 - 57.8 %   MCV 101.8 (H) 80.0 - 100.0 fL   MCH 32.4 26.0 - 34.0 pg   MCHC 31.8 30.0 - 36.0 g/dL   RDW 46.9 62.9 - 52.8 %   Platelets 179 150 - 400 K/uL   nRBC 0.0 0.0 - 0.2 %   Neutrophils Relative % 79 %   Neutro Abs 6.0 1.7 - 7.7 K/uL   Lymphocytes Relative 9 %   Lymphs Abs 0.7 0.7 - 4.0 K/uL   Monocytes Relative 7 %  Monocytes Absolute 0.5 0.1 - 1.0 K/uL   Eosinophils Relative 4 %   Eosinophils Absolute 0.3 0.0 - 0.5 K/uL   Basophils Relative 0 %   Basophils Absolute 0.0 0.0 - 0.1 K/uL   Immature Granulocytes 1 %   Abs Immature Granulocytes 0.04 0.00 - 0.07 K/uL    Comment: Performed at [facility], [address] [place], [place] [address]  Basic metabolic panel     Status: Abnormal   Collection Time: 01/11/23 12:53 PM  Result Value Ref Range   Sodium 135 135 - 145 mmol/L   Potassium 4.9 3.5 - 5.1 mmol/L   Chloride 91 (L) 98 - 111 mmol/L   CO2 20 (L) 22 - 32 mmol/L   Glucose, Bld 96 70 - 99 mg/dL    Comment: Glucose reference range applies only to samples taken after fasting for at least 8 hours.   BUN 60 (H) 6 - 20 mg/dL   Creatinine, Ser 29.56 (H) 0.61 - 1.24 mg/dL   Calcium 8.1 (L) 8.9 - 10.3 mg/dL   GFR, Estimated 3 (L) >60 mL/min    Comment: (NOTE) Calculated using the CKD-EPI Creatinine Equation (2021)    Anion gap 24 (H) 5 - 15    Comment: ELECTROLYTES REPEATED TO VERIFY Performed at [facility], [address], [place], [place] [address]   Troponin I (High Sensitivity)     Status: Abnormal   Collection Time: 01/11/23 12:53 PM  Result Value Ref Range   Troponin I (High  Sensitivity) 236 (HH) <18 ng/L    Comment: CALLED @1417  01/11/2023, NO ANSWER CRITICAL RESULT CALLED TO, READ BACK BY AND VERIFIED WITH M ROBERTS RN 01/11/2023 1428 BNUNNERY (NOTE) Elevated high sensitivity troponin I (hsTnI) values and significant  changes across serial measurements may suggest ACS but many other  chronic and acute conditions are known to elevate hsTnI results.  Refer to the "Links" section for chest pain algorithms and additional  guidance. Performed at [facility], [address], [place], [place] [address]   Troponin I (High Sensitivity)     Status: Abnormal   Collection Time: 01/11/23  2:53 PM  Result Value Ref Range   Troponin I (High Sensitivity) 243 (HH) <18 ng/L    Comment: CRITICAL VALUE NOTED. VALUE IS CONSISTENT WITH PREVIOUSLY REPORTED/CALLED VALUE (NOTE) Elevated high sensitivity troponin I (hsTnI) values and significant  changes across serial measurements may suggest ACS but many other  chronic and acute conditions are known to elevate hsTnI results.  Refer to the "Links" section for chest pain algorithms and additional  guidance. Performed at [facility], [address], [place], [place] [address]    DG Chest Portable 1 View  Result Date: 01/11/2023 CLINICAL DATA:  Chest pain. EXAM: PORTABLE CHEST 1 VIEW COMPARISON:  Chest radiograph 01/08/2023. FINDINGS: Unchanged right IJ approach dialysis catheter with tip projecting over the superior cavoatrial junction. Low lung volumes accentuate the pulmonary vasculature and cardiomediastinal silhouette. No consolidation or pulmonary edema. No pleural effusion or pneumothorax. IMPRESSION: Low lung volumes without evidence of acute cardiopulmonary disease. Electronically Signed   By: Orvan Falconer M.D.   On: 01/11/2023 14:41    Pending Labs Unresulted Labs (From admission, onward)     Start     Ordered   01/12/23 0500  Heparin level (unfractionated)  Daily,   R     Placed in "And" Linked Group    01/11/23 1448   01/12/23 0500  CBC  Daily,   R     Placed in "And" Linked Group   01/11/23 1448   01/12/23 0500  Lipoprotein A (LPA)  Tomorrow morning,   R        01/11/23 1526   01/12/23 0500  Lipid panel  Tomorrow morning,   R        01/11/23 1526   01/11/23 2300  Heparin level (unfractionated)  Once-Timed,   URGENT        01/11/23 1448            Vitals/Pain Today's Vitals   01/11/23 1243 01/11/23 1244 01/11/23 1339 01/11/23 1621  BP: (!) 129/92     Pulse: 85     Resp: 20     Temp: 97.9 F (36.6 C)     TempSrc: Oral     SpO2: 99%     PainSc:  7  7  5      Isolation Precautions No active isolations  Medications Medications  heparin ADULT infusion 100 units/mL (25000 units/215mL) (1,100 Units/hr Intravenous New Bag/Given 01/11/23 1542)  aspirin EC tablet 81 mg (has no administration in time range)  atorvastatin (LIPITOR) tablet 80 mg (has no administration in time range)  isosorbide mononitrate (IMDUR) 24 hr tablet 30 mg (30 mg Oral Given 01/11/23 1541)  metoprolol succinate (TOPROL-XL) 24 hr tablet 25 mg (has no administration in time range)  nitroGLYCERIN (NITROSTAT) SL tablet 0.4 mg (has no administration in time range)  clopidogrel (PLAVIX) tablet 75 mg (75 mg Oral Given 01/11/23 1541)  nitroGLYCERIN (NITROSTAT) SL tablet 0.4 mg (has no administration in time range)  acetaminophen (TYLENOL) tablet 650 mg (has no administration in time range)  ondansetron (ZOFRAN) injection 4 mg (has no administration in time range)  HYDROmorphone (DILAUDID) injection 1 mg (1 mg Intravenous Given 01/11/23 1323)  heparin bolus via infusion 4,000 Units (4,000 Units Intravenous Bolus from Bag 01/11/23 1546)  HYDROcodone-acetaminophen (NORCO/VICODIN) 5-325 MG per tablet 1 tablet (1 tablet Oral Given 01/11/23 1541)  aspirin chewable tablet 324 mg (324 mg Oral Given 01/11/23 1541)    Mobility walks with device     Focused Assessments Cardiac Assessment Handoff:    Lab Results   Component Value Date   CKTOTAL 125 07/22/2017   TROPONINI 0.04 (HH) 07/22/2017   No results found for: "DDIMER" Does the Patient currently have chest pain? No    R Recommendations: See Admitting Provider Note  Report given to:   Additional Notes:

## 2023-01-11 NOTE — Consult Note (Signed)
Cardiology Consultation   Patient ID: Cody Webster MRN: [id number]; DOB: [birth year]  Admit date: 01/11/2023 Date of Consult: 01/11/2023  PCP:  Grayce Sessions, NP   [facility] Providers Cardiologist:  Bryan Lemma, MD     - has note been seen by Dr. Herbie Baltimore in person since 2021.   Patient Profile:   Cody Webster is a 57 y.o. male with a hx of ESRD on HD, HTN, HLD, DM, presumed ICM, stroke, tobacco use, PTSD who is being seen 01/11/2023 for the evaluation of chest pain at the request of Dr. Maisie Fus.  History of Present Illness:   Cody Webster is a 57 yo male with PMH noted above. He has been followed by Dr. Herbie Baltimore as an outpatient.  He has been on hemodialysis since December 2013.  Previously underwent stress testing in 2016 which showed an EF of 47% with moderate inferior ischemia.  Echo at that time showed LVEF of 50%.  Further ischemic evaluation was deferred in setting of CKD.  Follow-up echocardiogram March 2019 showed LVEF of 55 to 60%.  December 2022 he was admitted with chest pain and a lunar infarct.  He was seen by cardiology in the setting of volume overload and chest pain.  EF by echo was 45 to 50%.  He was initiated on hemodialysis during that admission.  Ischemic evaluation was deferred absence of any angina with normal troponins and acute stroke.  Patient did not follow-up in the outpatient setting.  He was seen in consultation 09/2022 and reported over the previous months he had not tolerated dialysis well.  Complained of being very fatigued, diffuse body pains that seem to be worse on his HD days.  Reported chronic pain at baseline.  He has not particularly active at baseline but denied any chest pain.  During this admission he was evaluated for complaints of chest pain.  High-sensitivity troponin trended up to 790.  He was treated with IV heparin and ultimately underwent cardiac catheterization on 5/14.  Cardiac catheter revealed 90% ostial RCA lesion treated  with PCI/DES x 1, 65% mid LAD, 60% mid circumflex, 60% mid RCA.  Placed on DAPT with aspirin and Plavix for at least 1 year.  Seen back in the office on 5/23 with Juanda Crumble, PA for follow-up.  At this visit he complained of shortness of breath and fatigue.  He was not taking any of his cardiac medications including Plavix.  He was instructed to restart Plavix with a 300 mg load, metoprolol XL 25 mg daily, atorvastatin 80 mg daily, Imdur 30 mg daily.  Presented to the ED on 8/28 with complaints of chest pain.  Of note he was admitted several days prior for chest pain rule out however left AMA.  He was undergoing HD when he developed chest pain.  Labs in the ED showed sodium 135, potassium 4.9, creatinine 15.57, high-sensitivity troponin 236>> 43, WBC 7.8, hemoglobin 9.2.  EKG showed sinus rhythm, 82 bpm, nonspecific changes.  Chest x-ray with low lung volumes.  He was admitted to internal medicine for further management.  Cardiology asked to evaluate.  I spent quite a bit of time talking to Cody Webster in the ER.  He tells me that he was never told that he needed to take his medications and he was not given refill prescriptions.  He also said that nobody talked to his power of attorney supporters, and therefore he did not know the importance of certain medications.  He has not been taking  Plavix since at least July stating that he did not have any other refills.  He actually has not been taking any of the medications.  His symptoms are very unusual he has diffuse abdominal discomfort mostly on the left lateral side almost more in the flank that happens off and on he also describes having a tight squeezing in his chest that is more notable in his cold and that oftentimes happens in dialysis.  He has discomfort during dialysis.  Very scattered symptoms of potentially having exertional even resting chest pain.  He now presents with elevated troponin and did not have dialysis today.  Very confusing  situation.  No nausea or vomiting.  No rapid irregular heartbeats or palpitations.  No PND, orthopnea or edema.  No syncope or near syncope.   Past Medical History:  Diagnosis Date   Anemia    CAD (coronary artery disease)    stent to RCA in May '24   Carotid artery occlusion    Chronic combined systolic (congestive) and diastolic (congestive) heart failure (HCC)    a. 06/2014 Echo: EF 50%; b. 07/2017 Echo: EF 55-60%; c. 07/2020 Echo: EF 50-55%; d. 04/2022 Echo: EF 45-50%, basal-mid anterolateral and basal to mid inf HK. Mild conc LVH, GrII DD. NL RV fxn. RVSP 51.39mmHg. Sev dil LA. Mildly dil RA. MIld-mod MR.   Depression    ESRD (end stage renal disease) (HCC)    a. 04/2022 - initiated HD-->MWF   GERD (gastroesophageal reflux disease)    Hypertensive heart disease    Ischemic cardiomyopathy    a. 06/2014 Echo: EF 50%; b. 07/2017 Echo: EF 55-60%; d. 04/2018 MV: EF 47%, moderate inf ischemia; e. 04/2022 Echo: EF 45-50%.   Latent tuberculosis    Peripheral neuropathy    Pneumonia    PTSD (post-traumatic stress disorder)    Seizures (HCC)    27 years ago   Stroke [facility])    a. 04/2022 MRI Brain: small acute infarct - R posterior pons and subcortical L cerebral hemisphere. Chronic sm vessel dzs.   Tobacco abuse    Type II diabetes mellitus (HCC)    type 2    Past Surgical History:  Procedure Laterality Date   AV FISTULA PLACEMENT Left 11/08/2016   Procedure: ARTERIOVENOUS (AV) FISTULA CREATION;  Surgeon: Chuck Hint, MD;  Location: [facility] OR;  Service: Vascular;  Laterality: Left;   AV FISTULA PLACEMENT Left 03/23/2020   Procedure: LEFT BASILIC VEIN FISTULA CREATION;  Surgeon: Chuck Hint, MD;  Location: [facility] OR;  Service: Vascular;  Laterality: Left;   CORONARY LITHOTRIPSY N/A 09/27/2022   Procedure: CORONARY LITHOTRIPSY;  Surgeon: [name], MD;  Location: [facility] INVASIVE CV LAB;  Service: Cardiovascular;  Laterality: N/A;   CORONARY STENT INTERVENTION N/A 09/27/2022    Procedure: CORONARY STENT INTERVENTION;  Surgeon: [name], MD;  Location: [facility] INVASIVE CV LAB;  Service: Cardiovascular;  Laterality: N/A;   IR FLUORO GUIDE CV LINE RIGHT  05/10/2022   IR US GUIDE VASC ACCESS RIGHT  05/10/2022   LEFT HEART CATH AND CORONARY ANGIOGRAPHY N/A 09/27/2022   Procedure: LEFT HEART CATH AND CORONARY ANGIOGRAPHY;  Surgeon: [name], MD;  Location: [facility] INVASIVE CV LAB;  Service: Cardiovascular;  Laterality: N/A;   NM MYOVIEW LTD  07/2017   "Moderate sized moderate severity" (on cardiology was very small size, small intensity) partially reversible inferoapical//inferoseptal defect.  (Read as intermediate-high risk) -> over read by cardiology as LOW RISK   NM MYOVIEW LTD  09/2019  [facility] Small sized, mild severity reversible defect involving apical lateral & mid anterolateral wall thought to be artifacts - but CRO mild ischemia.  EF 55%. => Read as LOW RISK:   right foot surgery     TOE AMPUTATION     TRANSTHORACIC ECHOCARDIOGRAM  07/2017   EF 60%.,  Moderate LVH.  GR 1 DD.  No R WMA.  Mild RV and moderate RA dilation.   TRANSTHORACIC ECHOCARDIOGRAM  10/03/2019   [facility]: EF 40%, mild MR, mild aortic calcification     Home Medications:  Prior to Admission medications   Medication Sig Start Date End Date Taking? Authorizing Provider  acetaminophen (TYLENOL) 500 MG tablet Take 1,000 mg by mouth every 6 (six) hours as needed (for pain/headaches.).   Yes [provider]  aspirin EC 81 MG tablet Take 1 tablet (81 mg total) by mouth daily. Swallow whole. Patient taking differently: Take 81 mg by mouth daily. 10/06/22  Yes Juanda Crumble K, PA-C  fluticasone (FLONASE) 50 MCG/ACT nasal spray Place 2 sprays into both nostrils daily. Patient taking differently: Place 2 sprays into both nostrils daily as needed for allergies. 08/25/20  Yes Grayce Sessions, NP  gabapentin (NEURONTIN) 300 MG capsule TAKE 1 CAPSULE (300 MG TOTAL) BY MOUTH 2  (TWO) TIMES DAILY. Patient taking differently: Take 300 mg by mouth 2 (two) times daily as needed (for pain). 11/29/22  Yes Nadara Mustard, MD  nitroGLYCERIN (NITROSTAT) 0.4 MG SL tablet Place 1 tablet (0.4 mg total) under the tongue every 5 (five) minutes as needed for chest pain (CP or SOB). 07/22/17  Yes Albertine Grates, MD  oxyCODONE-acetaminophen (PERCOCET) 10-325 MG tablet Take 1 tablet by mouth every 6 (six) hours as needed for pain. 10/22/22  Yes Charlynne Pander, MD  traZODone (DESYREL) 100 MG tablet TAKE 1/2 TO 1 TABLET BY MOUTH AT BEDTIME AS NEEDED FOR SLEEP. Patient taking differently: Take 50 mg by mouth at bedtime as needed for sleep. 06/04/22  Yes Grayce Sessions, NP  atorvastatin (LIPITOR) 80 MG tablet Take 1 tablet (80 mg total) by mouth daily. 10/06/22 01/04/23  Cannon Kettle, PA-C  isoniazid (NYDRAZID) 300 MG tablet Take 1 tablet (300 mg total) by mouth daily. Patient not taking: Reported on 01/11/2023 05/12/22   Hughie Closs, MD  isosorbide mononitrate (IMDUR) 30 MG 24 hr tablet Take 1 tablet (30 mg total) by mouth daily. 10/06/22 01/04/23  Cannon Kettle, PA-C  metoprolol succinate (TOPROL XL) 25 MG 24 hr tablet Take 1 tablet (25 mg total) by mouth daily. Patient not taking: Reported on 01/11/2023 10/06/22   Cannon Kettle, PA-C  mirtazapine (REMERON) 7.5 MG tablet Take 1 tablet (7.5 mg total) by mouth at bedtime. Patient not taking: Reported on 05/27/2022 05/12/22 06/11/22  Hughie Closs, MD    Inpatient Medications: Scheduled Meds:  [START ON 01/12/2023] aspirin EC  81 mg Oral Daily   atorvastatin  80 mg Oral QHS   clopidogrel  75 mg Oral Daily   isosorbide mononitrate  30 mg Oral Daily   [START ON 01/12/2023] metoprolol succinate  25 mg Oral Daily   Continuous Infusions:  heparin 1,100 Units/hr (01/11/23 1542)   PRN Meds: acetaminophen, nitroGLYCERIN, nitroGLYCERIN, ondansetron (ZOFRAN) IV  Allergies:    Allergies  Allergen Reactions   Morphine And Codeine  Shortness Of Breath and Other (See Comments)    Hallucination  Tolerates Norco/Vicodin    Social History:   Social History   Socioeconomic History  Marital status: Single    Spouse name: Not on file   Number of children: 1   Years of education: Not on file   Highest education level: Not on file  Occupational History    Comment: Disabled - for Back pain & foot ulcer  Tobacco Use   Smoking status: Every Day    Current packs/day: 1.00    Average packs/day: 1 pack/day for 30.0 years (30.0 ttl pk-yrs)    Types: Cigarettes   Smokeless tobacco: Former    Types: Snuff, Chew    Quit date: 12/11/1983  Vaping Use   Vaping status: Never Used  Substance and Sexual Activity   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  Does not routinely exercise.   Social Determinants of Health   Financial Resource Strain: Medium Risk (08/30/2022)   Received from [facility]   Overall Financial Resource Strain (CARDIA)    Difficulty of Paying Living Expenses: Somewhat hard  Food Insecurity: No Food Insecurity (10/25/2022)   Hunger Vital Sign    Worried About Running Out of Food in the Last Year: Never true    Ran Out of Food in the Last Year: Never true  Transportation Needs: No Transportation Needs (10/25/2022)   PRAPARE - Administrator, Civil Service (Medical): No    Lack of Transportation (Non-Medical): No  Physical Activity: Not on File (07/23/2019)   Received from [place], [place]   Physical Activity    Physical Activity: 0  Stress: No Stress Concern Present (05/27/2022)   [employer] of Occupational Health - Occupational Stress Questionnaire    Feeling of Stress : Only a little  Social Connections: Unknown (09/12/2021)   Received from [facility]   Social Network     Social Network: Not on file  Intimate Partner Violence: Not At Risk (09/26/2022)   Humiliation, Afraid, Rape, and Kick questionnaire    Fear of Current or Ex-Partner: No    Emotionally Abused: No    Physically Abused: No    Sexually Abused: No    Family History:    Family History  Problem Relation Age of Onset   CAD Mother    Heart attack Mother    Diabetes type II Father    Stroke Father    CAD Father    Hypertension Sister    CAD Sister    Diabetes type II Brother    Colon cancer Maternal Uncle        2015   Colon polyps Neg Hx    Esophageal cancer Neg Hx    Stomach cancer Neg Hx    Rectal cancer Neg Hx      ROS:  Please see the history of present illness.  Abdominal/left flank pain; fatigue All other ROS reviewed and negative.     Physical Exam/Data:   Vitals:   01/11/23 1243  BP: (!) 129/92  Pulse: 85  Resp: 20  Temp: 97.9 F (36.6 C)  TempSrc: Oral  SpO2: 99%   No intake or output data in the 24 hours ending 01/11/23 1639    01/08/2023    6:25 AM 10/06/2022    9:03 AM 09/29/2022   12:21 PM  Last 3 Weights  Weight (lbs) 182 lb 5.1 oz 175  lb 12.8 oz 174 lb  Weight (kg) 82.7 kg 79.742 kg 78.926 kg     There is no height or weight on file to calculate BMI.  General:  Well nourished, well developed, in  HEENT: Right eye does not seem to be functional-either enucleated or damaged. Neck: no JVD Vascular: No carotid bruits; Distal pulses 2+ bilaterally Cardiac: Normal S1 and S2.  RRR.  Soft 1/6 SEM at RUSB.  No other rubs or gallops. Lungs:  clear to auscultation bilaterally, no wheezing, rhonchi or rales  Abd: soft, tenderness noted mostly in the left lateral upper and mid quadrants., no hepatomegaly  Ext: no edema Musculoskeletal:  No deformities, BUE and BLE strength normal and equal Skin: warm and dry  Neuro:  CNs 2-12 intact, no focal abnormalities noted Psych:  Normal affect ; not a great historian.  EKG:  The EKG was personally reviewed and  demonstrates: sinus rhythm, 82 bpm, nonspecific changes   Relevant CV Studies:  Cath: 09/27/2022  1.  Severe calcific ostial/proximal RCA stenosis of 90%, treated with balloon lithotripsy and coronary stenting using a 2.75 x 16 mm Synergy DES, reducing the stenosis to 0% 2.  Moderate residual coronary stenoses in the mid RCA, mid LAD, and mid circumflex, all likely of borderline hemodynamic significance with diffuse vessel calcification and atherosclerotic disease.  Recommend medical therapy. 3.  Severe calcification of the aortic root and ascending aorta   Recommendations: DAPT with aspirin and clopidogrel through 12 months without interruption.  Aggressive medical therapy.  Diagnostic: Dominance: Right      Intervention   Echo: 04/2022 EF 45 to 50%.  Basal to mid anterolateral and basal to mid inferior hypokinesis.  GR 2 DD.  Mild RV enlargement with moderately elevated PAP estimated at 52 mmHg, with RAP estimated 15 mmHg.Marland Kitchen  Severe LA dilation.  Mild RA dilation.  Moderate MAC with mild to moderate MR. Mild AoV calcification without stenosis.  Laboratory Data:  High Sensitivity Troponin:   Recent Labs  Lab 01/08/23 0024 01/08/23 0254 01/08/23 0713 01/11/23 1253 01/11/23 1453  TROPONINIHS 38* 37* 48* 236* 243*     Chemistry Recent Labs  Lab 01/08/23 0305 01/08/23 0713 01/09/23 1515 01/11/23 1253  NA  --  134* 133* 135  K  --  4.0 4.6 4.9  CL  --  89* 89* 91*  CO2  --  26 25 20*  GLUCOSE  --  101* 79 96  BUN  --  35* 50* 60*  CREATININE  --  12.68* 15.08* 15.57*  CALCIUM  --  9.4 8.4* 8.1*  MG 2.6*  --   --   --   GFRNONAA  --  4* 3* 3*  ANIONGAP  --  19* 19* 24*    Recent Labs  Lab 01/08/23 0305 01/08/23 0713 01/09/23 1515  PROT 7.8 8.0 7.4  ALBUMIN 3.0* 3.1* 3.0*  AST 14* 11* 11*  ALT 11 12 8   ALKPHOS 151* 158* 144*  BILITOT 0.7 0.6 0.8   Lipids No results for input(s): "CHOL", "TRIG", "HDL", "LABVLDL", "LDLCALC", "CHOLHDL" in the last 168 hours.   Hematology Recent Labs  Lab 01/08/23 0713 01/09/23 1515 01/11/23 1253  WBC 8.7 8.9 7.6  RBC 3.34* 3.26* 2.84*  HGB 10.8* 10.5* 9.2*  HCT 33.5* 33.1* 28.9*  MCV 100.3* 101.5* 101.8*  MCH 32.3 32.2 32.4  MCHC 32.2 31.7 31.8  RDW 14.7 14.9 15.1  PLT 189 192 179   Thyroid No results for input(s): "TSH", "FREET4" in the  last 168 hours.  BNPNo results for input(s): "BNP", "PROBNP" in the last 168 hours.  DDimer No results for input(s): "DDIMER" in the last 168 hours.   Radiology/Studies:  DG Chest Portable 1 View  Result Date: 01/11/2023 CLINICAL DATA:  Chest pain. EXAM: PORTABLE CHEST 1 VIEW COMPARISON:  Chest radiograph 01/08/2023. FINDINGS: Unchanged right IJ approach dialysis catheter with tip projecting over the superior cavoatrial junction. Low lung volumes accentuate the pulmonary vasculature and cardiomediastinal silhouette. No consolidation or pulmonary edema. No pleural effusion or pneumothorax. IMPRESSION: Low lung volumes without evidence of acute cardiopulmonary disease. Electronically Signed   By: Orvan Falconer M.D.   On: 01/11/2023 14:41   CT Angio Chest/Abd/Pel for Dissection W and/or Wo Contrast  Result Date: 01/08/2023 CLINICAL DATA:  Acute aortic syndrome suspected. EXAM: CT ANGIOGRAPHY CHEST, ABDOMEN AND PELVIS TECHNIQUE: Non-contrast CT of the chest was initially obtained. Multidetector CT imaging through the chest, abdomen and pelvis was performed using the standard protocol during bolus administration of intravenous contrast. Multiplanar reconstructed images and MIPs were obtained and reviewed to evaluate the vascular anatomy. RADIATION DOSE REDUCTION: This exam was performed according to the departmental dose-optimization program which includes automated exposure control, adjustment of the mA and/or kV according to patient size and/or use of iterative reconstruction technique. CONTRAST:  75mL OMNIPAQUE IOHEXOL 350 MG/ML SOLN COMPARISON:  10/22/2022 FINDINGS: CTA  CHEST FINDINGS Cardiovascular: Preferential opacification of the thoracic aorta. No evidence of thoracic aortic aneurysm or dissection. Normal heart size. No pericardial effusion. Confluent atheromatous calcification of the aorta and great vessels. Extensive atheromatous coronary calcification. Mediastinum/Nodes: Negative for hematoma or adenopathy Lungs/Pleura: Generalized airway thickening. There is no edema, consolidation, effusion, or pneumothorax. Calcified granuloma in the right lower lobe Musculoskeletal: Gynecomastia.  No acute finding. Review of the MIP images confirms the above findings. CTA ABDOMEN AND PELVIS FINDINGS VASCULAR Aorta: Diffuse atheromatous wall thickening. No aneurysm or dissection. 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 No branch occlusion or aneurysm SMA: Calcified plaque but no flow reducing stenosis. No branch occlusion Renals: Diffuse atheromatous plaque with heavy calcification causing over 50% stenosis at the right renal ostium. Symmetric renal enhancement. No aneurysm or dissection seen IMA: Patent Inflow: Heavily calcified atheromatous plaque. At least 50% narrowing at proximal right common iliac artery. No dissection or aneurysm Veins: Unremarkable in the arterial phase Review of the MIP images confirms the above findings. NON-VASCULAR Hepatobiliary: No focal liver abnormality.No evidence of biliary obstruction or stone. Pancreas: Unremarkable. Spleen: Unremarkable. Adrenals/Urinary Tract: Negative adrenals. No hydronephrosis or stone. Bilateral renal cortical thinning. Simple cyst at the lower pole left kidney measuring 2.3 cm. No follow-up imaging is recommended. Unremarkable bladder. Stomach/Bowel:  No obstruction. No appendicitis. Lymphatic: No mass or adenopathy. Reproductive:No pathologic findings. Other: No ascites or pneumoperitoneum. Musculoskeletal: No acute abnormalities. Review of the MIP images confirms the above findings.  IMPRESSION: 1. No acute finding including evidence of acute aortic syndrome. 2. 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 Electronically Signed   By: Tiburcio Pea M.D.   On: 01/08/2023 04:36   DG Chest 2 View  Result Date: 01/08/2023 CLINICAL DATA:  Chest pain EXAM: CHEST - 2 VIEW COMPARISON:  09/26/2022 FINDINGS: Cardiac shadow is stable. Aortic calcifications are noted. Dialysis catheter is seen in satisfactory position. Lungs are clear bilaterally. No bony abnormality is noted. IMPRESSION: No active cardiopulmonary disease. Electronically Signed   By: Eulah Pont.D.  On: 01/08/2023 00:53     Assessment and Plan:   Helios Geltz is a 57 y.o. male with a hx of ESRD on HD, HTN, HLD, DM, presumed ICM, stroke, tobacco use, PTSD who is being seen 01/11/2023 for the evaluation of chest pain at the request of Dr. Maisie Fus.  Chest pain CAD s/p RCA stent Elevated troponin in the setting of chest pain has to be considered possible non-STEMI -- presented with chest pain over the past several days, developed again while at HD. Sent from HD center -- hsTn 236>>243 in the setting of Cr 15 -- hx of issues with medication non-compliance -> freely admits that he has not been on medications; we will reinstate his medications including most notably his Plavix.   I am certainly concerned about possible stent thrombosis or occlusion especially with him seemingly having chest discomfort off and on since his PCI.  I had a long talk with him about the importance of taking his medications he did not seem to understand the importance of taking his Thienopyridine agent which I found hard to believe, but he was adamant about that and.  It may have been because he is last several times AMA. At this point, he has had several ER visits for this chest discomfort and now has had a bump in troponins similar to his previous non-STEMI and I think we  probably need to answer the question as to whether or not there is indeed issues with stent occlusion or thrombosis being off of his Plavix.  I have counseled him very strenuously on the portance of staying on Plavix. Will plan for invasive evaluation with with cardiac catheterization as a relook to exclude stent occlusion or progression of other disease.  Essentially if he fails to take his medications after this point I do not think that we would want to do any further evaluations.  Informed Consent   Shared Decision Making/Informed Consent The risks [stroke (1 in 1000), death (1 in 1000), kidney failure [usually temporary] (1 in 500), bleeding (1 in 200), allergic reaction [possibly serious] (1 in 200)], benefits (diagnostic support and management of coronary artery disease) and alternatives of a cardiac catheterization were discussed in detail with Cody Webster and he is willing to proceed.     ESRD on HD -- on MWF schedule, management per nephrology -> apparently, he has not been doing his dialysis routinely and did not have dialysis today because of having pain.  Will defer timing of dialysis to nephrology, but would likely be best performed after catheterization. -> I do think that we need to see how he does in dialysis with his blood pressures that may be that he simply needs to hold Toprol leading up to dialysis days but would otherwise need to be on it.  HTN -- blood pressures actually controlled in the ED -- Reinstitute Toprol XL 25mg  daily, Imdur 30mg  daily   HLD -- continue atorvastatin 80mg  daily   HFrEF -- Echo 04/2022 with LVEF of 45-50%, basal to mid anterolateral, basal to mid inferior hypokinesis, severe LAE, mild to moderate MR => would like to recheck echo this time around. -- volume management per HD -- GDMT: Topro XL 25mg  daily, Imdur, unable to add additional meds 2/2 renal disease  Per Primary DM Tobacco use Noncompliance PTSD    I am very concerned about his  nonadherence to whether this is because of his lack of understanding or simply lack of listening.  I stressed to him very strenuously  the importance of taking his medications most notably his Plavix.  I think we can give him 1 more chance of relook to ensure that his stent is patent and potentially treat his coronary disease, however if he continues to be not adherent to medications then I would not look to do any further invasive evaluation.   For questions or updates, please contact [facility] Please consult [email or web address] for contact info under    Signed, Bryan Lemma, MD 01/11/2023 4:39 PM

## 2023-01-11 NOTE — H&P (View-Only) (Signed)
Cardiology Consultation   Patient ID: Cody Webster MRN: [id number]; DOB: [birth year]  Admit date: 01/11/2023 Date of Consult: 01/11/2023  PCP:  Grayce Sessions, NP   [facility] Providers Cardiologist:  Bryan Lemma, MD     - has note been seen by Dr. Herbie Baltimore in person since 2021.   Patient Profile:   Cody Webster is a 57 y.o. male with a hx of ESRD on HD, HTN, HLD, DM, presumed ICM, stroke, tobacco use, PTSD who is being seen 01/11/2023 for the evaluation of chest pain at the request of Dr. Maisie Fus.  History of Present Illness:   Cody Webster is a 57 yo male with PMH noted above. He has been followed by Dr. Herbie Baltimore as an outpatient.  He has been on hemodialysis since December 2013.  Previously underwent stress testing in 2016 which showed an EF of 47% with moderate inferior ischemia.  Echo at that time showed LVEF of 50%.  Further ischemic evaluation was deferred in setting of CKD.  Follow-up echocardiogram March 2019 showed LVEF of 55 to 60%.  December 2022 he was admitted with chest pain and a lunar infarct.  He was seen by cardiology in the setting of volume overload and chest pain.  EF by echo was 45 to 50%.  He was initiated on hemodialysis during that admission.  Ischemic evaluation was deferred absence of any angina with normal troponins and acute stroke.  Patient did not follow-up in the outpatient setting.  He was seen in consultation 09/2022 and reported over the previous months he had not tolerated dialysis well.  Complained of being very fatigued, diffuse body pains that seem to be worse on his HD days.  Reported chronic pain at baseline.  He has not particularly active at baseline but denied any chest pain.  During this admission he was evaluated for complaints of chest pain.  High-sensitivity troponin trended up to 790.  He was treated with IV heparin and ultimately underwent cardiac catheterization on 5/14.  Cardiac catheter revealed 90% ostial RCA lesion treated  with PCI/DES x 1, 65% mid LAD, 60% mid circumflex, 60% mid RCA.  Placed on DAPT with aspirin and Plavix for at least 1 year.  Seen back in the office on 5/23 with Juanda Crumble, PA for follow-up.  At this visit he complained of shortness of breath and fatigue.  He was not taking any of his cardiac medications including Plavix.  He was instructed to restart Plavix with a 300 mg load, metoprolol XL 25 mg daily, atorvastatin 80 mg daily, Imdur 30 mg daily.  Presented to the ED on 8/28 with complaints of chest pain.  Of note he was admitted several days prior for chest pain rule out however left AMA.  He was undergoing HD when he developed chest pain.  Labs in the ED showed sodium 135, potassium 4.9, creatinine 15.57, high-sensitivity troponin 236>> 43, WBC 7.8, hemoglobin 9.2.  EKG showed sinus rhythm, 82 bpm, nonspecific changes.  Chest x-ray with low lung volumes.  He was admitted to internal medicine for further management.  Cardiology asked to evaluate.  I spent quite a bit of time talking to Cody Webster in the ER.  He tells me that he was never told that he needed to take his medications and he was not given refill prescriptions.  He also said that nobody talked to his power of attorney supporters, and therefore he did not know the importance of certain medications.  He has not been taking  Plavix since at least July stating that he did not have any other refills.  He actually has not been taking any of the medications.  His symptoms are very unusual he has diffuse abdominal discomfort mostly on the left lateral side almost more in the flank that happens off and on he also describes having a tight squeezing in his chest that is more notable in his cold and that oftentimes happens in dialysis.  He has discomfort during dialysis.  Very scattered symptoms of potentially having exertional even resting chest pain.  He now presents with elevated troponin and did not have dialysis today.  Very confusing  situation.  No nausea or vomiting.  No rapid irregular heartbeats or palpitations.  No PND, orthopnea or edema.  No syncope or near syncope.   Past Medical History:  Diagnosis Date   Anemia    CAD (coronary artery disease)    stent to RCA in May '24   Carotid artery occlusion    Chronic combined systolic (congestive) and diastolic (congestive) heart failure (HCC)    a. 06/2014 Echo: EF 50%; b. 07/2017 Echo: EF 55-60%; c. 07/2020 Echo: EF 50-55%; d. 04/2022 Echo: EF 45-50%, basal-mid anterolateral and basal to mid inf HK. Mild conc LVH, GrII DD. NL RV fxn. RVSP 51.39mmHg. Sev dil LA. Mildly dil RA. MIld-mod MR.   Depression    ESRD (end stage renal disease) (HCC)    a. 04/2022 - initiated HD-->MWF   GERD (gastroesophageal reflux disease)    Hypertensive heart disease    Ischemic cardiomyopathy    a. 06/2014 Echo: EF 50%; b. 07/2017 Echo: EF 55-60%; d. 04/2018 MV: EF 47%, moderate inf ischemia; e. 04/2022 Echo: EF 45-50%.   Latent tuberculosis    Peripheral neuropathy    Pneumonia    PTSD (post-traumatic stress disorder)    Seizures (HCC)    27 years ago   Stroke [facility])    a. 04/2022 MRI Brain: small acute infarct - R posterior pons and subcortical L cerebral hemisphere. Chronic sm vessel dzs.   Tobacco abuse    Type II diabetes mellitus (HCC)    type 2    Past Surgical History:  Procedure Laterality Date   AV FISTULA PLACEMENT Left 11/08/2016   Procedure: ARTERIOVENOUS (AV) FISTULA CREATION;  Surgeon: Chuck Hint, MD;  Location: [facility] OR;  Service: Vascular;  Laterality: Left;   AV FISTULA PLACEMENT Left 03/23/2020   Procedure: LEFT BASILIC VEIN FISTULA CREATION;  Surgeon: Chuck Hint, MD;  Location: [facility] OR;  Service: Vascular;  Laterality: Left;   CORONARY LITHOTRIPSY N/A 09/27/2022   Procedure: CORONARY LITHOTRIPSY;  Surgeon: [name], MD;  Location: [facility] INVASIVE CV LAB;  Service: Cardiovascular;  Laterality: N/A;   CORONARY STENT INTERVENTION N/A 09/27/2022    Procedure: CORONARY STENT INTERVENTION;  Surgeon: [name], MD;  Location: [facility] INVASIVE CV LAB;  Service: Cardiovascular;  Laterality: N/A;   IR FLUORO GUIDE CV LINE RIGHT  05/10/2022   IR US GUIDE VASC ACCESS RIGHT  05/10/2022   LEFT HEART CATH AND CORONARY ANGIOGRAPHY N/A 09/27/2022   Procedure: LEFT HEART CATH AND CORONARY ANGIOGRAPHY;  Surgeon: [name], MD;  Location: [facility] INVASIVE CV LAB;  Service: Cardiovascular;  Laterality: N/A;   NM MYOVIEW LTD  07/2017   "Moderate sized moderate severity" (on cardiology was very small size, small intensity) partially reversible inferoapical//inferoseptal defect.  (Read as intermediate-high risk) -> over read by cardiology as LOW RISK   NM MYOVIEW LTD  09/2019  [facility] Small sized, mild severity reversible defect involving apical lateral & mid anterolateral wall thought to be artifacts - but CRO mild ischemia.  EF 55%. => Read as LOW RISK:   right foot surgery     TOE AMPUTATION     TRANSTHORACIC ECHOCARDIOGRAM  07/2017   EF 60%.,  Moderate LVH.  GR 1 DD.  No R WMA.  Mild RV and moderate RA dilation.   TRANSTHORACIC ECHOCARDIOGRAM  10/03/2019   [facility]: EF 40%, mild MR, mild aortic calcification     Home Medications:  Prior to Admission medications   Medication Sig Start Date End Date Taking? Authorizing Provider  acetaminophen (TYLENOL) 500 MG tablet Take 1,000 mg by mouth every 6 (six) hours as needed (for pain/headaches.).   Yes [provider]  aspirin EC 81 MG tablet Take 1 tablet (81 mg total) by mouth daily. Swallow whole. Patient taking differently: Take 81 mg by mouth daily. 10/06/22  Yes Juanda Crumble K, PA-C  fluticasone (FLONASE) 50 MCG/ACT nasal spray Place 2 sprays into both nostrils daily. Patient taking differently: Place 2 sprays into both nostrils daily as needed for allergies. 08/25/20  Yes Grayce Sessions, NP  gabapentin (NEURONTIN) 300 MG capsule TAKE 1 CAPSULE (300 MG TOTAL) BY MOUTH 2  (TWO) TIMES DAILY. Patient taking differently: Take 300 mg by mouth 2 (two) times daily as needed (for pain). 11/29/22  Yes Nadara Mustard, MD  nitroGLYCERIN (NITROSTAT) 0.4 MG SL tablet Place 1 tablet (0.4 mg total) under the tongue every 5 (five) minutes as needed for chest pain (CP or SOB). 07/22/17  Yes Albertine Grates, MD  oxyCODONE-acetaminophen (PERCOCET) 10-325 MG tablet Take 1 tablet by mouth every 6 (six) hours as needed for pain. 10/22/22  Yes Charlynne Pander, MD  traZODone (DESYREL) 100 MG tablet TAKE 1/2 TO 1 TABLET BY MOUTH AT BEDTIME AS NEEDED FOR SLEEP. Patient taking differently: Take 50 mg by mouth at bedtime as needed for sleep. 06/04/22  Yes Grayce Sessions, NP  atorvastatin (LIPITOR) 80 MG tablet Take 1 tablet (80 mg total) by mouth daily. 10/06/22 01/04/23  Cannon Kettle, PA-C  isoniazid (NYDRAZID) 300 MG tablet Take 1 tablet (300 mg total) by mouth daily. Patient not taking: Reported on 01/11/2023 05/12/22   Hughie Closs, MD  isosorbide mononitrate (IMDUR) 30 MG 24 hr tablet Take 1 tablet (30 mg total) by mouth daily. 10/06/22 01/04/23  Cannon Kettle, PA-C  metoprolol succinate (TOPROL XL) 25 MG 24 hr tablet Take 1 tablet (25 mg total) by mouth daily. Patient not taking: Reported on 01/11/2023 10/06/22   Cannon Kettle, PA-C  mirtazapine (REMERON) 7.5 MG tablet Take 1 tablet (7.5 mg total) by mouth at bedtime. Patient not taking: Reported on 05/27/2022 05/12/22 06/11/22  Hughie Closs, MD    Inpatient Medications: Scheduled Meds:  [START ON 01/12/2023] aspirin EC  81 mg Oral Daily   atorvastatin  80 mg Oral QHS   clopidogrel  75 mg Oral Daily   isosorbide mononitrate  30 mg Oral Daily   [START ON 01/12/2023] metoprolol succinate  25 mg Oral Daily   Continuous Infusions:  heparin 1,100 Units/hr (01/11/23 1542)   PRN Meds: acetaminophen, nitroGLYCERIN, nitroGLYCERIN, ondansetron (ZOFRAN) IV  Allergies:    Allergies  Allergen Reactions   Morphine And Codeine  Shortness Of Breath and Other (See Comments)    Hallucination  Tolerates Norco/Vicodin    Social History:   Social History   Socioeconomic History  Marital status: Single    Spouse name: Not on file   Number of children: 1   Years of education: Not on file   Highest education level: Not on file  Occupational History    Comment: Disabled - for Back pain & foot ulcer  Tobacco Use   Smoking status: Every Day    Current packs/day: 1.00    Average packs/day: 1 pack/day for 30.0 years (30.0 ttl pk-yrs)    Types: Cigarettes   Smokeless tobacco: Former    Types: Snuff, Chew    Quit date: 12/11/1983  Vaping Use   Vaping status: Never Used  Substance and Sexual Activity   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  Does not routinely exercise.   Social Determinants of Health   Financial Resource Strain: Medium Risk (08/30/2022)   Received from [facility]   Overall Financial Resource Strain (CARDIA)    Difficulty of Paying Living Expenses: Somewhat hard  Food Insecurity: No Food Insecurity (10/25/2022)   Hunger Vital Sign    Worried About Running Out of Food in the Last Year: Never true    Ran Out of Food in the Last Year: Never true  Transportation Needs: No Transportation Needs (10/25/2022)   PRAPARE - Administrator, Civil Service (Medical): No    Lack of Transportation (Non-Medical): No  Physical Activity: Not on File (07/23/2019)   Received from [place], [place]   Physical Activity    Physical Activity: 0  Stress: No Stress Concern Present (05/27/2022)   [employer] of Occupational Health - Occupational Stress Questionnaire    Feeling of Stress : Only a little  Social Connections: Unknown (09/12/2021)   Received from [facility]   Social Network     Social Network: Not on file  Intimate Partner Violence: Not At Risk (09/26/2022)   Humiliation, Afraid, Rape, and Kick questionnaire    Fear of Current or Ex-Partner: No    Emotionally Abused: No    Physically Abused: No    Sexually Abused: No    Family History:    Family History  Problem Relation Age of Onset   CAD Mother    Heart attack Mother    Diabetes type II Father    Stroke Father    CAD Father    Hypertension Sister    CAD Sister    Diabetes type II Brother    Colon cancer Maternal Uncle        2015   Colon polyps Neg Hx    Esophageal cancer Neg Hx    Stomach cancer Neg Hx    Rectal cancer Neg Hx      ROS:  Please see the history of present illness.  Abdominal/left flank pain; fatigue All other ROS reviewed and negative.     Physical Exam/Data:   Vitals:   01/11/23 1243  BP: (!) 129/92  Pulse: 85  Resp: 20  Temp: 97.9 F (36.6 C)  TempSrc: Oral  SpO2: 99%   No intake or output data in the 24 hours ending 01/11/23 1639    01/08/2023    6:25 AM 10/06/2022    9:03 AM 09/29/2022   12:21 PM  Last 3 Weights  Weight (lbs) 182 lb 5.1 oz 175  lb 12.8 oz 174 lb  Weight (kg) 82.7 kg 79.742 kg 78.926 kg     There is no height or weight on file to calculate BMI.  General:  Well nourished, well developed, in  HEENT: Right eye does not seem to be functional-either enucleated or damaged. Neck: no JVD Vascular: No carotid bruits; Distal pulses 2+ bilaterally Cardiac: Normal S1 and S2.  RRR.  Soft 1/6 SEM at RUSB.  No other rubs or gallops. Lungs:  clear to auscultation bilaterally, no wheezing, rhonchi or rales  Abd: soft, tenderness noted mostly in the left lateral upper and mid quadrants., no hepatomegaly  Ext: no edema Musculoskeletal:  No deformities, BUE and BLE strength normal and equal Skin: warm and dry  Neuro:  CNs 2-12 intact, no focal abnormalities noted Psych:  Normal affect ; not a great historian.  EKG:  The EKG was personally reviewed and  demonstrates: sinus rhythm, 82 bpm, nonspecific changes   Relevant CV Studies:  Cath: 09/27/2022  1.  Severe calcific ostial/proximal RCA stenosis of 90%, treated with balloon lithotripsy and coronary stenting using a 2.75 x 16 mm Synergy DES, reducing the stenosis to 0% 2.  Moderate residual coronary stenoses in the mid RCA, mid LAD, and mid circumflex, all likely of borderline hemodynamic significance with diffuse vessel calcification and atherosclerotic disease.  Recommend medical therapy. 3.  Severe calcification of the aortic root and ascending aorta   Recommendations: DAPT with aspirin and clopidogrel through 12 months without interruption.  Aggressive medical therapy.  Diagnostic: Dominance: Right      Intervention   Echo: 04/2022 EF 45 to 50%.  Basal to mid anterolateral and basal to mid inferior hypokinesis.  GR 2 DD.  Mild RV enlargement with moderately elevated PAP estimated at 52 mmHg, with RAP estimated 15 mmHg.Marland Kitchen  Severe LA dilation.  Mild RA dilation.  Moderate MAC with mild to moderate MR. Mild AoV calcification without stenosis.  Laboratory Data:  High Sensitivity Troponin:   Recent Labs  Lab 01/08/23 0024 01/08/23 0254 01/08/23 0713 01/11/23 1253 01/11/23 1453  TROPONINIHS 38* 37* 48* 236* 243*     Chemistry Recent Labs  Lab 01/08/23 0305 01/08/23 0713 01/09/23 1515 01/11/23 1253  NA  --  134* 133* 135  K  --  4.0 4.6 4.9  CL  --  89* 89* 91*  CO2  --  26 25 20*  GLUCOSE  --  101* 79 96  BUN  --  35* 50* 60*  CREATININE  --  12.68* 15.08* 15.57*  CALCIUM  --  9.4 8.4* 8.1*  MG 2.6*  --   --   --   GFRNONAA  --  4* 3* 3*  ANIONGAP  --  19* 19* 24*    Recent Labs  Lab 01/08/23 0305 01/08/23 0713 01/09/23 1515  PROT 7.8 8.0 7.4  ALBUMIN 3.0* 3.1* 3.0*  AST 14* 11* 11*  ALT 11 12 8   ALKPHOS 151* 158* 144*  BILITOT 0.7 0.6 0.8   Lipids No results for input(s): "CHOL", "TRIG", "HDL", "LABVLDL", "LDLCALC", "CHOLHDL" in the last 168 hours.   Hematology Recent Labs  Lab 01/08/23 0713 01/09/23 1515 01/11/23 1253  WBC 8.7 8.9 7.6  RBC 3.34* 3.26* 2.84*  HGB 10.8* 10.5* 9.2*  HCT 33.5* 33.1* 28.9*  MCV 100.3* 101.5* 101.8*  MCH 32.3 32.2 32.4  MCHC 32.2 31.7 31.8  RDW 14.7 14.9 15.1  PLT 189 192 179   Thyroid No results for input(s): "TSH", "FREET4" in the  last 168 hours.  BNPNo results for input(s): "BNP", "PROBNP" in the last 168 hours.  DDimer No results for input(s): "DDIMER" in the last 168 hours.   Radiology/Studies:  DG Chest Portable 1 View  Result Date: 01/11/2023 CLINICAL DATA:  Chest pain. EXAM: PORTABLE CHEST 1 VIEW COMPARISON:  Chest radiograph 01/08/2023. FINDINGS: Unchanged right IJ approach dialysis catheter with tip projecting over the superior cavoatrial junction. Low lung volumes accentuate the pulmonary vasculature and cardiomediastinal silhouette. No consolidation or pulmonary edema. No pleural effusion or pneumothorax. IMPRESSION: Low lung volumes without evidence of acute cardiopulmonary disease. Electronically Signed   By: Orvan Falconer M.D.   On: 01/11/2023 14:41   CT Angio Chest/Abd/Pel for Dissection W and/or Wo Contrast  Result Date: 01/08/2023 CLINICAL DATA:  Acute aortic syndrome suspected. EXAM: CT ANGIOGRAPHY CHEST, ABDOMEN AND PELVIS TECHNIQUE: Non-contrast CT of the chest was initially obtained. Multidetector CT imaging through the chest, abdomen and pelvis was performed using the standard protocol during bolus administration of intravenous contrast. Multiplanar reconstructed images and MIPs were obtained and reviewed to evaluate the vascular anatomy. RADIATION DOSE REDUCTION: This exam was performed according to the departmental dose-optimization program which includes automated exposure control, adjustment of the mA and/or kV according to patient size and/or use of iterative reconstruction technique. CONTRAST:  75mL OMNIPAQUE IOHEXOL 350 MG/ML SOLN COMPARISON:  10/22/2022 FINDINGS: CTA  CHEST FINDINGS Cardiovascular: Preferential opacification of the thoracic aorta. No evidence of thoracic aortic aneurysm or dissection. Normal heart size. No pericardial effusion. Confluent atheromatous calcification of the aorta and great vessels. Extensive atheromatous coronary calcification. Mediastinum/Nodes: Negative for hematoma or adenopathy Lungs/Pleura: Generalized airway thickening. There is no edema, consolidation, effusion, or pneumothorax. Calcified granuloma in the right lower lobe Musculoskeletal: Gynecomastia.  No acute finding. Review of the MIP images confirms the above findings. CTA ABDOMEN AND PELVIS FINDINGS VASCULAR Aorta: Diffuse atheromatous wall thickening. No aneurysm or dissection. 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 No branch occlusion or aneurysm SMA: Calcified plaque but no flow reducing stenosis. No branch occlusion Renals: Diffuse atheromatous plaque with heavy calcification causing over 50% stenosis at the right renal ostium. Symmetric renal enhancement. No aneurysm or dissection seen IMA: Patent Inflow: Heavily calcified atheromatous plaque. At least 50% narrowing at proximal right common iliac artery. No dissection or aneurysm Veins: Unremarkable in the arterial phase Review of the MIP images confirms the above findings. NON-VASCULAR Hepatobiliary: No focal liver abnormality.No evidence of biliary obstruction or stone. Pancreas: Unremarkable. Spleen: Unremarkable. Adrenals/Urinary Tract: Negative adrenals. No hydronephrosis or stone. Bilateral renal cortical thinning. Simple cyst at the lower pole left kidney measuring 2.3 cm. No follow-up imaging is recommended. Unremarkable bladder. Stomach/Bowel:  No obstruction. No appendicitis. Lymphatic: No mass or adenopathy. Reproductive:No pathologic findings. Other: No ascites or pneumoperitoneum. Musculoskeletal: No acute abnormalities. Review of the MIP images confirms the above findings.  IMPRESSION: 1. No acute finding including evidence of acute aortic syndrome. 2. 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 Electronically Signed   By: Tiburcio Pea M.D.   On: 01/08/2023 04:36   DG Chest 2 View  Result Date: 01/08/2023 CLINICAL DATA:  Chest pain EXAM: CHEST - 2 VIEW COMPARISON:  09/26/2022 FINDINGS: Cardiac shadow is stable. Aortic calcifications are noted. Dialysis catheter is seen in satisfactory position. Lungs are clear bilaterally. No bony abnormality is noted. IMPRESSION: No active cardiopulmonary disease. Electronically Signed   By: Eulah Pont.D.  On: 01/08/2023 00:53     Assessment and Plan:   Helios Geltz is a 57 y.o. male with a hx of ESRD on HD, HTN, HLD, DM, presumed ICM, stroke, tobacco use, PTSD who is being seen 01/11/2023 for the evaluation of chest pain at the request of Dr. Maisie Fus.  Chest pain CAD s/p RCA stent Elevated troponin in the setting of chest pain has to be considered possible non-STEMI -- presented with chest pain over the past several days, developed again while at HD. Sent from HD center -- hsTn 236>>243 in the setting of Cr 15 -- hx of issues with medication non-compliance -> freely admits that he has not been on medications; we will reinstate his medications including most notably his Plavix.   I am certainly concerned about possible stent thrombosis or occlusion especially with him seemingly having chest discomfort off and on since his PCI.  I had a long talk with him about the importance of taking his medications he did not seem to understand the importance of taking his Thienopyridine agent which I found hard to believe, but he was adamant about that and.  It may have been because he is last several times AMA. At this point, he has had several ER visits for this chest discomfort and now has had a bump in troponins similar to his previous non-STEMI and I think we  probably need to answer the question as to whether or not there is indeed issues with stent occlusion or thrombosis being off of his Plavix.  I have counseled him very strenuously on the portance of staying on Plavix. Will plan for invasive evaluation with with cardiac catheterization as a relook to exclude stent occlusion or progression of other disease.  Essentially if he fails to take his medications after this point I do not think that we would want to do any further evaluations.  Informed Consent   Shared Decision Making/Informed Consent The risks [stroke (1 in 1000), death (1 in 1000), kidney failure [usually temporary] (1 in 500), bleeding (1 in 200), allergic reaction [possibly serious] (1 in 200)], benefits (diagnostic support and management of coronary artery disease) and alternatives of a cardiac catheterization were discussed in detail with Cody Webster and he is willing to proceed.     ESRD on HD -- on MWF schedule, management per nephrology -> apparently, he has not been doing his dialysis routinely and did not have dialysis today because of having pain.  Will defer timing of dialysis to nephrology, but would likely be best performed after catheterization. -> I do think that we need to see how he does in dialysis with his blood pressures that may be that he simply needs to hold Toprol leading up to dialysis days but would otherwise need to be on it.  HTN -- blood pressures actually controlled in the ED -- Reinstitute Toprol XL 25mg  daily, Imdur 30mg  daily   HLD -- continue atorvastatin 80mg  daily   HFrEF -- Echo 04/2022 with LVEF of 45-50%, basal to mid anterolateral, basal to mid inferior hypokinesis, severe LAE, mild to moderate MR => would like to recheck echo this time around. -- volume management per HD -- GDMT: Topro XL 25mg  daily, Imdur, unable to add additional meds 2/2 renal disease  Per Primary DM Tobacco use Noncompliance PTSD    I am very concerned about his  nonadherence to whether this is because of his lack of understanding or simply lack of listening.  I stressed to him very strenuously  the importance of taking his medications most notably his Plavix.  I think we can give him 1 more chance of relook to ensure that his stent is patent and potentially treat his coronary disease, however if he continues to be not adherent to medications then I would not look to do any further invasive evaluation.   For questions or updates, please contact [facility] Please consult [email or web address] for contact info under    Signed, Bryan Lemma, MD 01/11/2023 4:39 PM

## 2023-01-11 NOTE — Progress Notes (Signed)
ANTICOAGULATION CONSULT NOTE - Follow Up  Pharmacy Consult for heparin Indication: chest pain/ACS  Allergies  Allergen Reactions   Morphine And Codeine Shortness Of Breath and Other (See Comments)    Hallucination  Tolerates Norco/Vicodin    Patient Measurements:   Heparin Dosing Weight: TBW  Vital Signs: Temp: 97.3 F (36.3 C) (08/28 2253) Temp Source: Oral (08/28 2253) BP: 151/70 (08/28 2253) Pulse Rate: 80 (08/28 2253)  Labs: Recent Labs    01/09/23 1515 01/11/23 1253 01/11/23 1453 01/11/23 2301  HGB 10.5* 9.2*  --   --   HCT 33.1* 28.9*  --   --   PLT 192 179  --   --   HEPARINUNFRC  --   --   --  0.42  CREATININE 15.08* 15.57*  --   --   TROPONINIHS  --  236* 243*  --     Estimated Creatinine Clearance: 5.8 mL/min (A) (by C-G formula based on SCr of 15.57 mg/dL (H)).   Medical History: Past Medical History:  Diagnosis Date   Anemia    CAD (coronary artery disease)    stent to RCA in May '24   Carotid artery occlusion    Chronic combined systolic (congestive) and diastolic (congestive) heart failure (HCC)    a. 06/2014 Echo: EF 50%; b. 07/2017 Echo: EF 55-60%; c. 07/2020 Echo: EF 50-55%; d. 04/2022 Echo: EF 45-50%, basal-mid anterolateral and basal to mid inf HK. Mild conc LVH, GrII DD. NL RV fxn. RVSP 51.59mmHg. Sev dil LA. Mildly dil RA. MIld-mod MR.   Depression    ESRD (end stage renal disease) (HCC)    a. 04/2022 - initiated HD-->MWF   GERD (gastroesophageal reflux disease)    Hypertensive heart disease    Ischemic cardiomyopathy    a. 06/2014 Echo: EF 50%; b. 07/2017 Echo: EF 55-60%; d. 04/2018 MV: EF 47%, moderate inf ischemia; e. 04/2022 Echo: EF 45-50%.   Latent tuberculosis    Peripheral neuropathy    Pneumonia    PTSD (post-traumatic stress disorder)    Seizures (HCC)    27 years ago   Stroke [facility])    a. 04/2022 MRI Brain: small acute infarct - R posterior pons and subcortical L cerebral hemisphere. Chronic sm vessel dzs.   Tobacco abuse     Type II diabetes mellitus (HCC)    type 2     Assessment: 72 YOM presenting with CP, elevated troponin, he is not on anticoagulation PTA, chronic anemia of ESRD stable  8/28 PM: heparin level returned at 0.42 on 1100 units/hr (therapeutic). No signs or symptoms of bleeding reported or issues with the heparin. Last CBC Hgb dropped from 10.5 > 9.2.  Goal of Therapy:  Heparin level 0.3-0.7 units/ml Monitor platelets by anticoagulation protocol: Yes   Plan:  Heparin IV gtt at 1100 units/hr F/u confirmatory 8 hour heparin level Follow Up daily CBC and heparin level  F/u cards eval and recs  Arabella Merles, PharmD. Clinical Pharmacist 01/11/2023 11:57 PM

## 2023-01-12 ENCOUNTER — Ambulatory Visit (INDEPENDENT_AMBULATORY_CARE_PROVIDER_SITE_OTHER): Payer: Medicaid Other | Admitting: Primary Care

## 2023-01-12 ENCOUNTER — Encounter (HOSPITAL_COMMUNITY): Payer: Self-pay

## 2023-01-12 ENCOUNTER — Other Ambulatory Visit (HOSPITAL_COMMUNITY): Payer: Self-pay

## 2023-01-12 ENCOUNTER — Inpatient Hospital Stay (HOSPITAL_COMMUNITY): Payer: Medicaid Other

## 2023-01-12 ENCOUNTER — Encounter (HOSPITAL_COMMUNITY)
Admission: EM | Disposition: A | Discharge status: Home / Self Care | Payer: Self-pay | Source: Home / Self Care | Attending: Emergency Medicine

## 2023-01-12 DIAGNOSIS — I25118 Atherosclerotic heart disease of native coronary artery with other forms of angina pectoris: Secondary | ICD-10-CM | POA: Diagnosis not present

## 2023-01-12 DIAGNOSIS — I5042 Chronic combined systolic (congestive) and diastolic (congestive) heart failure: Secondary | ICD-10-CM | POA: Diagnosis not present

## 2023-01-12 DIAGNOSIS — Z955 Presence of coronary angioplasty implant and graft: Secondary | ICD-10-CM | POA: Diagnosis not present

## 2023-01-12 DIAGNOSIS — E785 Hyperlipidemia, unspecified: Secondary | ICD-10-CM | POA: Diagnosis not present

## 2023-01-12 DIAGNOSIS — I2510 Atherosclerotic heart disease of native coronary artery without angina pectoris: Secondary | ICD-10-CM

## 2023-01-12 DIAGNOSIS — N186 End stage renal disease: Secondary | ICD-10-CM | POA: Diagnosis not present

## 2023-01-12 DIAGNOSIS — F17200 Nicotine dependence, unspecified, uncomplicated: Secondary | ICD-10-CM

## 2023-01-12 DIAGNOSIS — Z992 Dependence on renal dialysis: Secondary | ICD-10-CM | POA: Diagnosis not present

## 2023-01-12 DIAGNOSIS — E1122 Type 2 diabetes mellitus with diabetic chronic kidney disease: Secondary | ICD-10-CM | POA: Diagnosis not present

## 2023-01-12 DIAGNOSIS — I214 Non-ST elevation (NSTEMI) myocardial infarction: Secondary | ICD-10-CM | POA: Diagnosis not present

## 2023-01-12 HISTORY — PX: LEFT HEART CATH AND CORONARY ANGIOGRAPHY: CATH118249

## 2023-01-12 LAB — CBC
HCT: 27.6 % — ABNORMAL LOW (ref 39.0–52.0)
Hemoglobin: 8.9 g/dL — ABNORMAL LOW (ref 13.0–17.0)
MCH: 32.1 pg (ref 26.0–34.0)
MCHC: 32.2 g/dL (ref 30.0–36.0)
MCV: 99.6 fL (ref 80.0–100.0)
Platelets: 202 10*3/uL (ref 150–400)
RBC: 2.77 MIL/uL — ABNORMAL LOW (ref 4.22–5.81)
RDW: 14.8 % (ref 11.5–15.5)
WBC: 8.2 10*3/uL (ref 4.0–10.5)
nRBC: 0 % (ref 0.0–0.2)

## 2023-01-12 LAB — LIPID PANEL
Cholesterol: 131 mg/dL (ref 0–200)
HDL: 38 mg/dL — ABNORMAL LOW (ref >40)
LDL Cholesterol: 72 mg/dL (ref 0–99)
Total CHOL/HDL Ratio: 3.4 ratio
Triglycerides: 104 mg/dL (ref <150)
VLDL: 21 mg/dL (ref 0–40)

## 2023-01-12 LAB — BASIC METABOLIC PANEL
Anion gap: 22 — ABNORMAL HIGH (ref 5–15)
BUN: 70 mg/dL — ABNORMAL HIGH (ref 6–20)
CO2: 21 mmol/L — ABNORMAL LOW (ref 22–32)
Calcium: 7.8 mg/dL — ABNORMAL LOW (ref 8.9–10.3)
Chloride: 91 mmol/L — ABNORMAL LOW (ref 98–111)
Creatinine, Ser: 17.26 mg/dL — ABNORMAL HIGH (ref 0.61–1.24)
GFR, Estimated: 3 mL/min — ABNORMAL LOW (ref >60)
Glucose, Bld: 85 mg/dL (ref 70–99)
Potassium: 4.3 mmol/L (ref 3.5–5.1)
Sodium: 134 mmol/L — ABNORMAL LOW (ref 135–145)

## 2023-01-12 LAB — GLUCOSE, CAPILLARY: Glucose-Capillary: 102 mg/dL — ABNORMAL HIGH (ref 70–99)

## 2023-01-12 LAB — HEPARIN LEVEL (UNFRACTIONATED): Heparin Unfractionated: 0.37 [IU]/mL (ref 0.30–0.70)

## 2023-01-12 SURGERY — LEFT HEART CATH AND CORONARY ANGIOGRAPHY
Anesthesia: LOCAL

## 2023-01-12 MED ORDER — SODIUM CHLORIDE 0.9 % IV SOLN: INTRAVENOUS | Status: DC

## 2023-01-12 MED ORDER — METOPROLOL SUCCINATE ER 25 MG PO TB24
25.0000 mg | ORAL_TABLET | Freq: Every day | ORAL | 3 refills | Status: DC
  Filled 2023-01-12: qty 90, 90d supply, fill #0

## 2023-01-12 MED ORDER — IOHEXOL 350 MG/ML SOLN
INTRAVENOUS | Status: DC | PRN
  Administered 2023-01-12: 106 mL

## 2023-01-12 MED ORDER — LIDOCAINE HCL (PF) 1 % IJ SOLN
INTRAMUSCULAR | Status: AC
  Filled 2023-01-12: qty 30

## 2023-01-12 MED ORDER — MIDAZOLAM HCL 2 MG/2ML IJ SOLN
INTRAMUSCULAR | Status: AC
  Filled 2023-01-12: qty 2

## 2023-01-12 MED ORDER — FENTANYL CITRATE (PF) 100 MCG/2ML IJ SOLN
INTRAMUSCULAR | Status: AC
  Filled 2023-01-12: qty 2

## 2023-01-12 MED ORDER — SODIUM CHLORIDE 0.9% FLUSH: 3.0000 mL | INTRAVENOUS | Status: DC | PRN

## 2023-01-12 MED ORDER — HYDRALAZINE HCL 20 MG/ML IJ SOLN: 10.0000 mg | INTRAMUSCULAR | Status: DC | PRN

## 2023-01-12 MED ORDER — HYDROMORPHONE HCL 1 MG/ML IJ SOLN
1.0000 mg | Freq: Once | INTRAMUSCULAR | Status: AC
  Administered 2023-01-12: 1 mg via INTRAVENOUS
  Filled 2023-01-12: qty 1

## 2023-01-12 MED ORDER — LIDOCAINE HCL (PF) 1 % IJ SOLN
INTRAMUSCULAR | Status: DC | PRN
  Administered 2023-01-12: 15 mL

## 2023-01-12 MED ORDER — ATORVASTATIN CALCIUM 80 MG PO TABS
80.0000 mg | ORAL_TABLET | Freq: Every day | ORAL | 3 refills | Status: DC
  Filled 2023-01-12: qty 90, 90d supply, fill #0

## 2023-01-12 MED ORDER — HEPARIN SODIUM (PORCINE) 5000 UNIT/ML IJ SOLN: 5000.0000 [IU] | Freq: Three times a day (TID) | INTRAMUSCULAR | Status: DC

## 2023-01-12 MED ORDER — HEPARIN (PORCINE) IN NACL 1000-0.9 UT/500ML-% IV SOLN
INTRAVENOUS | Status: DC | PRN
  Administered 2023-01-12 (×2): 500 mL

## 2023-01-12 MED ORDER — FENTANYL CITRATE (PF) 100 MCG/2ML IJ SOLN
INTRAMUSCULAR | Status: DC | PRN
  Administered 2023-01-12 (×2): 25 ug via INTRAVENOUS

## 2023-01-12 MED ORDER — CLOPIDOGREL BISULFATE 75 MG PO TABS
75.0000 mg | ORAL_TABLET | Freq: Every day | ORAL | 3 refills | Status: DC
  Filled 2023-01-12: qty 30, 30d supply, fill #0

## 2023-01-12 MED ORDER — ATORVASTATIN CALCIUM 80 MG PO TABS: 80.0000 mg | ORAL_TABLET | Freq: Every day | ORAL | 3 refills | Status: AC

## 2023-01-12 MED ORDER — HYDROCODONE-ACETAMINOPHEN 5-325 MG PO TABS: 1.0000 | ORAL_TABLET | ORAL | Status: DC | PRN

## 2023-01-12 MED ORDER — CLOPIDOGREL BISULFATE 75 MG PO TABS: 75.0000 mg | ORAL_TABLET | Freq: Every day | ORAL | 3 refills | Status: AC

## 2023-01-12 MED ORDER — SODIUM CHLORIDE 0.9% FLUSH
3.0000 mL | Freq: Two times a day (BID) | INTRAVENOUS | Status: DC
  Administered 2023-01-12: 3 mL via INTRAVENOUS

## 2023-01-12 MED ORDER — METOPROLOL SUCCINATE ER 25 MG PO TB24: 25.0000 mg | ORAL_TABLET | Freq: Every day | ORAL | 3 refills | Status: AC

## 2023-01-12 MED ORDER — HYDROMORPHONE HCL 1 MG/ML IJ SOLN: 1.0000 mg | INTRAMUSCULAR | Status: DC | PRN

## 2023-01-12 MED ORDER — MIDAZOLAM HCL 2 MG/2ML IJ SOLN
INTRAMUSCULAR | Status: DC | PRN
  Administered 2023-01-12: .5 mg via INTRAVENOUS

## 2023-01-12 MED ORDER — CLOPIDOGREL BISULFATE 75 MG PO TABS: 75.0000 mg | ORAL_TABLET | Freq: Every day | ORAL | Status: DC

## 2023-01-12 MED ORDER — SODIUM CHLORIDE 0.9 % IV SOLN: 250.0000 mL | INTRAVENOUS | Status: DC | PRN

## 2023-01-12 SURGICAL SUPPLY — 12 items
CATH INFINITI 5 FR 3DRC (CATHETERS) IMPLANT
CATH INFINITI 5 FR MPA2 (CATHETERS) IMPLANT
CATH INFINITI 5FR AL1 (CATHETERS) IMPLANT
CATH INFINITI 5FR MULTPACK ANG (CATHETERS) IMPLANT
CATH LAUNCHER 5F AR1 (CATHETERS) IMPLANT
CLOSURE MYNX CONTROL 5F (Vascular Products) IMPLANT
KIT MICROPUNCTURE NIT STIFF (SHEATH) IMPLANT
KIT SYRINGE INJ CVI SPIKEX1 (MISCELLANEOUS) IMPLANT
PACK CARDIAC CATHETERIZATION (CUSTOM PROCEDURE TRAY) ×1 IMPLANT
SET ATX-X65L (MISCELLANEOUS) IMPLANT
SHEATH PINNACLE 5F 10CM (SHEATH) IMPLANT
WIRE EMERALD 3MM-J .035X150CM (WIRE) IMPLANT

## 2023-01-12 NOTE — Progress Notes (Addendum)
Has not  Rounding Note    Patient Name: Cody Webster Date of Encounter: 01/12/2023  [facility] Cardiologist: Bryan Lemma, MD   Subjective   Back from cardiac cath. No chest pain. Asking for dilaudid, says he can't hold his leg still. Said he doesn't like Dr. Herbie [place], states "I'll choke him if I have to talk to him"     Inpatient Medications    Scheduled Meds:  aspirin EC  81 mg Oral Daily   atorvastatin  80 mg Oral QHS   clopidogrel  75 mg Oral Daily   insulin aspart  0-9 Units Subcutaneous TID WC   isosorbide mononitrate  30 mg Oral Daily   metoprolol succinate  25 mg Oral Daily   Continuous Infusions:  PRN Meds: acetaminophen, nitroGLYCERIN, nitroGLYCERIN, ondansetron (ZOFRAN) IV, oxyCODONE-acetaminophen **AND** oxyCODONE   Vital Signs    Vitals:   01/12/23 0814 01/12/23 0819 01/12/23 0824 01/12/23 0829  BP: (!) 160/70 (!) 160/69 (!) 154/70 (!) 159/76  Pulse: 92 92 92 90  Resp: (!) 8 (!) 8 12 13   Temp:      TempSrc:      SpO2: 98% 95% 94% 98%  Weight:      Height:        Intake/Output Summary (Last 24 hours) at 01/12/2023 0930 Last data filed at 01/12/2023 0604 Gross per 24 hour  Intake 188.31 ml  Output --  Net 188.31 ml      01/12/2023    3:12 AM 01/11/2023   10:53 PM 01/08/2023    6:25 AM  Last 3 Weights  Weight (lbs) 186 lb 8.2 oz 186 lb 8.2 oz 182 lb 5.1 oz  Weight (kg) 84.6 kg 84.6 kg 82.7 kg      Telemetry    Sinus Rhythm - Personally Reviewed  Physical Exam   GEN: No acute distress.   Neck: No JVD Cardiac: RRR, no murmurs, rubs, or gallops. HD cath R chest Respiratory: Clear to auscultation bilaterally. GI: Soft, nontender, non-distended  MS: No edema; No deformity. Right femoral cath site stable. Neuro:  Nonfocal  Psych: Normal affect   Labs    High Sensitivity Troponin:   Recent Labs  Lab 01/08/23 0024 01/08/23 0254 01/08/23 0713 01/11/23 1253 01/11/23 1453  TROPONINIHS 38* 37* 48* 236* 243*      Chemistry Recent Labs  Lab 01/08/23 0305 01/08/23 0713 01/09/23 1515 01/11/23 1253 01/12/23 0623  NA  --  134* 133* 135 134*  K  --  4.0 4.6 4.9 4.3  CL  --  89* 89* 91* 91*  CO2  --  26 25 20* 21*  GLUCOSE  --  101* 79 96 85  BUN  --  35* 50* 60* 70*  CREATININE  --  12.68* 15.08* 15.57* 17.26*  CALCIUM  --  9.4 8.4* 8.1* 7.8*  MG 2.6*  --   --   --   --   PROT 7.8 8.0 7.4  --   --   ALBUMIN 3.0* 3.1* 3.0*  --   --   AST 14* 11* 11*  --   --   ALT 11 12 8   --   --   ALKPHOS 151* 158* 144*  --   --   BILITOT 0.7 0.6 0.8  --   --   GFRNONAA  --  4* 3* 3* 3*  ANIONGAP  --  19* 19* 24* 22*    Lipids  Recent Labs  Lab 01/12/23 0359  CHOL 131  TRIG 104  HDL 38*  LDLCALC 72  CHOLHDL 3.4    Hematology Recent Labs  Lab 01/09/23 1515 01/11/23 1253 01/12/23 0359  WBC 8.9 7.6 8.2  RBC 3.26* 2.84* 2.77*  HGB 10.5* 9.2* 8.9*  HCT 33.1* 28.9* 27.6*  MCV 101.5* 101.8* 99.6  MCH 32.2 32.4 32.1  MCHC 31.7 31.8 32.2  RDW 14.9 15.1 14.8  PLT 192 179 202   Thyroid No results for input(s): "TSH", "FREET4" in the last 168 hours.  BNPNo results for input(s): "BNP", "PROBNP" in the last 168 hours.  DDimer No results for input(s): "DDIMER" in the last 168 hours.   Cardiac Studies   Cath: 01/12/2023  Severely calcified aorta.  Patent stent at the ostium of the RCA Moderate LCA disease unchanged from prior Normal LVEDP   Plan: continue medical therapy  Diagnostic Dominance: Right   Patient Profile     57 y.o. male with a hx of ESRD on HD, HTN, HLD, DM, presumed ICM, stroke, tobacco use, PTSD who is being seen 01/11/2023 for the evaluation of chest pain at the request of Dr. Maisie Fus.   Assessment & Plan    Chest pain CAD s/p RCA stent Elevated troponin in the setting of chest pain has to be considered possible non-STEMI -- presented with chest pain over the past several days, developed again while at HD. Sent from HD center. -- hsTn 236>>243 in the setting of Cr  15 -- hx of issues with medication non-compliance -> freely admits that he has not been on medications but says he is able to get medications without an issue -- underwent relook cath this morning with patent pRCA, moderate disease in the LCx and LAD with recommendations for medical management. Mynx closure device used, currently on bedrest -- continue ASA, plavix, Toprol, Imdur   ESRD on HD -- on MWF schedule, management per nephrology -- reports he was not going to HD regularly and missed yesterday because he was having pain   HTN -- blood pressures elevated, but has not received HD yet -- continue Toprol XL 25mg  daily, Imdur 30mg  daily    HLD -- continue atorvastatin 80mg  daily    HFrEF -- Echo 04/2022 with LVEF of 45-50%, basal to mid anterolateral, basal to mid inferior hypokinesis, severe LAE, mild to moderate MR  -- repeat echo pending -- volume management per HD -- GDMT: Topro XL 25mg  daily, Imdur, unable to add additional meds 2/2 renal disease   Per Primary DM Tobacco use Noncompliance  For questions or updates, please contact [facility] Please consult [email or web address] for contact info under        Signed, Laverda Page, NP  01/12/2023, 9:30 AM     ATTENDING ATTESTATION  I have seen, examined and evaluated the patient this morning on rounds Su Hilt, NP after reviewing his Films with Dr. [place].After reviewing all the available data and chart, we discussed the patients laboratory, study & physical findings as well as symptoms in detail.  I agree with her findings, examination as well as impression recommendations as per our discussion.    When I saw him today comfortably on this discussion.  He indicated that he was "joking " when he said that he would "choke me".  I informed him that I do not consider this a joke even if it was intended that way because it is an appropriate conversation and it is something that would cause me to no longer want to provide  care for him as a  cardiologist.  I discussed the results of his heart catheterization, and how it was very difficult to visualize his RCA due to in-stent being ostial and difficult to engage.  I stressed the importance of being on Plavix and not missing a dose. Recommended that he does not take his beta-blocker on his dialysis days to avoid hypotension.  He is fine for discharge today and I will notify the practice that I do not intend to see him again as a patient!    Marykay Lex, MD, MS Bryan Lemma, M.D., M.S. Interventional Cardiologist  [facility]  Pager # [phone] Phone # [phone] [address] [place], [place] [address]

## 2023-01-12 NOTE — Progress Notes (Signed)
Patient refused to remain lying flat fo r the full time. Patient was educated but refused to listen. Patient got fully dressed and called his ride. Patient refused to allow further assessment of accessed left groin sight. Patient educated and patient Discharged

## 2023-01-12 NOTE — Discharge Summary (Addendum)
Physician Discharge Summary   Patient: Cody Webster MRN: [id number] DOB: [birth year]  Admit date:     01/11/2023  Discharge date: 01/12/23  Discharge Physician: Thad Ranger, MD    PCP: Grayce Sessions, NP   Recommendations at discharge:   Continue Plavix 75 mg daily Continue aspirin 81 mg daily  Discharge Diagnoses:    NSTEMI (non-ST elevated myocardial infarction) (HCC) Anemia due to chronic kidney disease Chronic combined systolic and diastolic CHF Diabetes mellitus type 2 ESRD on hemodialysis, noncompliant GERD Chronic pain syndrome with pain medication seeking behavior    Hospital Course:  Patient is a 57 year old male with CAD status post stent to RCA in May 2024 on DAPT, chronic systolic/diastolic heart failure, ESRD  on HD MWF, chronic low back pain, GERD, type 2 diabetes mellitus complicated by diabetic peripheral polyneuropathy, who presents to ED with complaint of  left lower  back pain worse over the last few days. Of note patient has interim history of admission to [facility] 01/08/2023 for CP however prior to work up completed patient signed out AMA. Patient currently states due to lower back pain he is unable to sit to have his HD completed. He notes he went to HD today but could not get it completed. He notes that he has chronic pain in this area however his chronic pain medications are not effective.  IN reference to history of prior chest pain he denies any recurrent of chest pain.     ED Course:  In ED, patient was found to have increasing trop and with his prior complaint of chest pain 2 days ago , was thought to have has an NSTEMI.  Due to this cardiology was consulted who recommend initiation of heparin drip and further cardiac evaluation.    Assessment and Plan:   Chest pain with NSTEMI, underlying history of CAD status post stent to RCA in May 2024 -Patient presented with chest pain over the past few days, developed again while at HD and was sent from the HD  center. -Patient does admit to medication noncompliance -Underwent cardiac cath which showed patent RCA stent, moderate disease in left circumflex and LAD and was recommended medical management. -Continue aspirin, Plavix, Toprol, Imdur.  Strongly recommended compliance with medications. -Cleared by cardiology to discharge home  ESRD on hemodialysis -On MWF schedule, also reports noncompliance with hemodialysis -Patient was strongly recommended to go for hemodialysis for his scheduled tomorrow  Hypotension -Continue Toprol-XL, Imdur   Hyperlipidemia -Continue atorvastatin  Chronic HFrEF -2D echo 04/2022 showed EF of 45 to 50%, basal to mid anterolateral basal to mid inferior hypokinesis, severe LAE and mild to moderate MR -Repeat echocardiogram still pending however patient wants to discharge home. -Volume management with HD, patient admitted to go for hemodialysis per his schedule  Diabetes mellitus type 2, NIDDM with nephropathy, ESRD -Appears to be diet controlled -Hemoglobin A1c 6.0 on 8/25/4  Chronic pain syndrome -Patient was noted to be repeatedly asking for the pain medications, IV pain medications.  Noted some narcotic seeking behavior.  Needs to follow-up outpatient with PCP.       Pain control - [employer] Controlled Substance Reporting System database was reviewed. and patient was instructed, not to drive, operate heavy machinery, perform activities at heights, swimming or participation in water activities or provide baby-sitting services while on Pain, Sleep and Anxiety Medications; until their outpatient Physician has advised to do so again. Also recommended to not to take more than prescribed Pain, Sleep and Anxiety Medications.  Consultants: Cardiology Procedures performed: Cardiac cath Disposition: Home Diet recommendation:  Discharge Diet Orders (From admission, onward)     Start     Ordered   01/12/23 0000  Diet - low sodium heart healthy         01/12/23 1147            DISCHARGE MEDICATION: Allergies as of 01/12/2023       Reactions   Morphine And Codeine Shortness Of Breath, Other (See Comments)   Hallucination  Tolerates Norco/Vicodin        Medication List     TAKE these medications    acetaminophen 500 MG tablet Commonly known as: TYLENOL Take 1,000 mg by mouth every 6 (six) hours as needed (for pain/headaches.).   aspirin EC 81 MG tablet Take 1 tablet (81 mg total) by mouth daily. Swallow whole. What changed: additional instructions   atorvastatin 80 MG tablet Commonly known as: LIPITOR Take 1 tablet (80 mg total) by mouth daily.   clopidogrel 75 MG tablet Commonly known as: PLAVIX Take 1 tablet (75 mg total) by mouth daily. Start taking on: January 13, 2023   fluticasone 50 MCG/ACT nasal spray Commonly known as: FLONASE Place 2 sprays into both nostrils daily. What changed:  when to take this reasons to take this   gabapentin 300 MG capsule Commonly known as: NEURONTIN TAKE 1 CAPSULE (300 MG TOTAL) BY MOUTH 2 (TWO) TIMES DAILY. What changed:  when to take this reasons to take this   isosorbide mononitrate 30 MG 24 hr tablet Commonly known as: IMDUR Take 1 tablet (30 mg total) by mouth daily.   metoprolol succinate 25 MG 24 hr tablet Commonly known as: Toprol XL Take 1 tablet (25 mg total) by mouth daily.   nitroGLYCERIN 0.4 MG SL tablet Commonly known as: NITROSTAT Place 1 tablet (0.4 mg total) under the tongue every 5 (five) minutes as needed for chest pain (CP or SOB).   oxyCODONE-acetaminophen 10-325 MG tablet Commonly known as: Percocet Take 1 tablet by mouth every 6 (six) hours as needed for pain.   traZODone 100 MG tablet Commonly known as: DESYREL TAKE 1/2 TO 1 TABLET BY MOUTH AT BEDTIME AS NEEDED FOR SLEEP. What changed:  how much to take reasons to take this additional instructions        Follow-up Information     Grayce Sessions, NP. Schedule an  appointment as soon as possible for a visit in 2 week(s).   Specialty: Internal Medicine Why: for hospital follow-up Contact information: [address] [place] [address] [phone]                Discharge Exam: Ceasar Mons Weights   01/11/23 2253 01/12/23 0312  Weight: 84.6 kg 84.6 kg   S: No acute complaints, cleared by cardiology to discharge home after bedrest restriction completed.  No chest pain, no acute shortness of breath, fevers chills or abdominal pain.  Patient reports chronic hip pain and asking for pain medications.  BP (!) 159/76   Pulse 90   Temp (!) 97.3 F (36.3 C) (Oral)   Resp 13   Ht 6\' 1"  (1.854 m)   Wt 84.6 kg   SpO2 98%   BMI 24.61 kg/m   Physical Exam General: Alert and oriented x 3, NAD Cardiovascular: S1 S2 clear, RRR.  Respiratory: CTAB, no wheezing, rales or rhonchi Gastrointestinal: Soft, nontender, nondistended, NBS Ext: no pedal edema bilaterally Neuro: no new deficits Psych: Normal affect  Condition at discharge: fair  The results of significant diagnostics from this hospitalization (including imaging, microbiology, ancillary and laboratory) are listed below for reference.   Imaging Studies: CARDIAC CATHETERIZATION  Result Date: 01/12/2023 Severely calcified aorta. Patent stent at the ostium of the RCA Moderate LCA disease unchanged from prior Normal LVEDP Plan: continue medical therapy.   DG Chest Portable 1 View  Result Date: 01/11/2023 CLINICAL DATA:  Chest pain. EXAM: PORTABLE CHEST 1 VIEW COMPARISON:  Chest radiograph 01/08/2023. FINDINGS: Unchanged right IJ approach dialysis catheter with tip projecting over the superior cavoatrial junction. Low lung volumes accentuate the pulmonary vasculature and cardiomediastinal silhouette. No consolidation or pulmonary edema. No pleural effusion or pneumothorax. IMPRESSION: Low lung volumes without evidence of acute cardiopulmonary disease. Electronically Signed   By:  Orvan Falconer M.D.   On: 01/11/2023 14:41   CT Angio Chest/Abd/Pel for Dissection W and/or Wo Contrast  Result Date: 01/08/2023 CLINICAL DATA:  Acute aortic syndrome suspected. EXAM: CT ANGIOGRAPHY CHEST, ABDOMEN AND PELVIS TECHNIQUE: Non-contrast CT of the chest was initially obtained. Multidetector CT imaging through the chest, abdomen and pelvis was performed using the standard protocol during bolus administration of intravenous contrast. Multiplanar reconstructed images and MIPs were obtained and reviewed to evaluate the vascular anatomy. RADIATION DOSE REDUCTION: This exam was performed according to the departmental dose-optimization program which includes automated exposure control, adjustment of the mA and/or kV according to patient size and/or use of iterative reconstruction technique. CONTRAST:  75mL OMNIPAQUE IOHEXOL 350 MG/ML SOLN COMPARISON:  10/22/2022 FINDINGS: CTA CHEST FINDINGS Cardiovascular: Preferential opacification of the thoracic aorta. No evidence of thoracic aortic aneurysm or dissection. Normal heart size. No pericardial effusion. Confluent atheromatous calcification of the aorta and great vessels. Extensive atheromatous coronary calcification. Mediastinum/Nodes: Negative for hematoma or adenopathy Lungs/Pleura: Generalized airway thickening. There is no edema, consolidation, effusion, or pneumothorax. Calcified granuloma in the right lower lobe Musculoskeletal: Gynecomastia.  No acute finding. Review of the MIP images confirms the above findings. CTA ABDOMEN AND PELVIS FINDINGS VASCULAR Aorta: Diffuse atheromatous wall thickening. No aneurysm or dissection. 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 No branch occlusion or aneurysm SMA: Calcified plaque but no flow reducing stenosis. No branch occlusion Renals: Diffuse atheromatous plaque with heavy calcification causing over 50% stenosis at the right renal ostium. Symmetric renal enhancement.  No aneurysm or dissection seen IMA: Patent Inflow: Heavily calcified atheromatous plaque. At least 50% narrowing at proximal right common iliac artery. No dissection or aneurysm Veins: Unremarkable in the arterial phase Review of the MIP images confirms the above findings. NON-VASCULAR Hepatobiliary: No focal liver abnormality.No evidence of biliary obstruction or stone. Pancreas: Unremarkable. Spleen: Unremarkable. Adrenals/Urinary Tract: Negative adrenals. No hydronephrosis or stone. Bilateral renal cortical thinning. Simple cyst at the lower pole left kidney measuring 2.3 cm. No follow-up imaging is recommended. Unremarkable bladder. Stomach/Bowel:  No obstruction. No appendicitis. Lymphatic: No mass or adenopathy. Reproductive:No pathologic findings. Other: No ascites or pneumoperitoneum. Musculoskeletal: No acute abnormalities. Review of the MIP images confirms the above findings. IMPRESSION: 1. No acute finding including evidence of acute aortic syndrome. 2. 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 Electronically Signed   By: Tiburcio Pea M.D.   On: 01/08/2023 04:36   DG Chest 2 View  Result Date: 01/08/2023 CLINICAL DATA:  Chest pain EXAM: CHEST - 2 VIEW COMPARISON:  09/26/2022 FINDINGS: Cardiac shadow is stable. Aortic calcifications are noted. Dialysis catheter is  seen in satisfactory position. Lungs are clear bilaterally. No bony abnormality is noted. IMPRESSION: No active cardiopulmonary disease. Electronically Signed   By: Alcide Clever M.D.   On: 01/08/2023 00:53    Microbiology: Results for orders placed or performed during the hospital encounter of 01/07/23  MRSA Next Gen by PCR, Nasal     Status: None   Collection Time: 01/08/23  6:18 AM   Specimen: Nasal Mucosa; Nasal Swab  Result Value Ref Range Status   MRSA by PCR Next Gen NOT DETECTED NOT DETECTED Final    Comment: (NOTE) The GeneXpert MRSA Assay (FDA  approved for NASAL specimens only), is one component of a comprehensive MRSA colonization surveillance program. It is not intended to diagnose MRSA infection nor to guide or monitor treatment for MRSA infections. Test performance is not FDA approved in patients less than 37 years old. Performed at [facility], [address], [place], [place] [address]     Labs: CBC: Recent Labs  Lab 01/08/23 0024 01/08/23 0713 01/09/23 1515 01/11/23 1253 01/12/23 0359  WBC 6.9 8.7 8.9 7.6 8.2  NEUTROABS  --  5.8  --  6.0  --   HGB 9.6* 10.8* 10.5* 9.2* 8.9*  HCT 29.6* 33.5* 33.1* 28.9* 27.6*  MCV 100.3* 100.3* 101.5* 101.8* 99.6  PLT 172 189 192 179 202   Basic Metabolic Panel: Recent Labs  Lab 01/08/23 0024 01/08/23 0305 01/08/23 0713 01/09/23 1515 01/11/23 1253 01/12/23 0623  NA 135  --  134* 133* 135 134*  K 3.8  --  4.0 4.6 4.9 4.3  CL 89*  --  89* 89* 91* 91*  CO2 28  --  26 25 20* 21*  GLUCOSE 117*  --  101* 79 96 85  BUN 34*  --  35* 50* 60* 70*  CREATININE 12.07*  --  12.68* 15.08* 15.57* 17.26*  CALCIUM 9.4  --  9.4 8.4* 8.1* 7.8*  MG  --  2.6*  --   --   --   --    Liver Function Tests: Recent Labs  Lab 01/08/23 0305 01/08/23 0713 01/09/23 1515  AST 14* 11* 11*  ALT 11 12 8   ALKPHOS 151* 158* 144*  BILITOT 0.7 0.6 0.8  PROT 7.8 8.0 7.4  ALBUMIN 3.0* 3.1* 3.0*   CBG: Recent Labs  Lab 01/08/23 0639 01/11/23 2134 01/12/23 0530  GLUCAP 78 105* 102*    Discharge time spent: greater than 30 minutes.  Signed: Thad Ranger, MD [facility] 01/12/2023

## 2023-01-12 NOTE — Discharge Planning (Signed)
[facility] Patient Discharge Orders - [facility] CLINIC: [place]  Patient's name: Cody Webster Admit/DC Dates: 01/11/2023 - 01/12/2023 *Note - not seen by nephrology or dialyzed during this admit*  DISCHARGE DIAGNOSES: NSTEMI - CAD - underwent LHC - non-occlusive disease ESRD HTN  HD ORDER CHANGES: Heparin change: no EDW Change: yes New EDW: 80kg [place] Change: no  ANEMIA MANAGEMENT: Aranesp: Given: no   ESA dose for discharge: mircera 50 mcg IV q 2 weeks (same) IV Iron dose at discharge: per protocol Transfusion: Given: no  BONE/MINERAL MEDICATIONS: Hectorol/Calcitriol change: no Sensipar/Parsabiv change: no  ACCESS INTERVENTION/CHANGE: no Details:  RECENT LABS: Recent Labs  Lab 01/09/23 1515 01/11/23 1253 01/12/23 0359 01/12/23 0623  HGB 10.5*   < > 8.9*  --   NA 133*   < >  --  134*  K 4.6   < >  --  4.3  CALCIUM 8.4*   < >  --  7.8*  ALBUMIN 3.0*  --   --   --    < > = values in this interval not displayed.    IV ANTIBIOTICS: no Details:  OTHER ANTICOAGULATION: On Coumadin?: no   OTHER/APPTS/LAB ORDERS:  - Left in a rush/AMA - assure that he is taking ASA 81mg  daily and Plavix 75mg  daily.  D/C Meds to be reconciled by nurse after every discharge.  Completed By: Ozzie Hoyle, PA-C [facility] Pager [phone]   Reviewed by: MD:______ RN_______

## 2023-01-12 NOTE — Interval H&P Note (Signed)
History and Physical Interval Note:  01/12/2023 7:30 AM  Cody Webster  has presented today for surgery, with the diagnosis of NSTEMI.  The various methods of treatment have been discussed with the patient and family. After consideration of risks, benefits and other options for treatment, the patient has consented to  Procedure(s): LEFT HEART CATH AND CORONARY ANGIOGRAPHY (N/A) as a surgical intervention.  The patient's history has been reviewed, patient examined, no change in status, stable for surgery.  I have reviewed the patient's chart and labs.  Questions were answered to the patient's satisfaction.   Cath Lab Visit (complete for each Cath Lab visit)  Clinical Evaluation Leading to the Procedure:   ACS: Yes.    Non-ACS:    Anginal Classification: CCS III  Anti-ischemic medical therapy: Maximal Therapy (2 or more classes of medications)  Non-Invasive Test Results: No non-invasive testing performed  Prior CABG: No previous CABG        Theron Arista [facility] 01/12/2023 7:30 AM

## 2023-01-12 NOTE — Plan of Care (Signed)
POC initiated and progressing. 

## 2023-01-13 ENCOUNTER — Telehealth: Payer: Self-pay

## 2023-01-13 ENCOUNTER — Encounter (HOSPITAL_COMMUNITY): Payer: Self-pay | Admitting: Cardiology

## 2023-01-13 ENCOUNTER — Telehealth: Payer: Self-pay | Admitting: Nephrology

## 2023-01-13 LAB — LIPOPROTEIN A (LPA): Lipoprotein (a): 81.5 nmol/L — ABNORMAL HIGH (ref <75.0)

## 2023-01-13 NOTE — Transitions of Care (Post Inpatient/ED Visit) (Signed)
   01/13/2023  Name: Cody Webster MRN: [id number] DOB: 1966-01-19  Today's TOC FU Call Status: Unsuccessful Call (1st Attempt) Date: 01/13/23 Unsuccessful Call (2nd Attempt) Date: 01/13/23 (Patient returned RNCM call - currently at dialysis and unable to speak to complete TOC follow-up.)  Attempted to reach the patient regarding the most recent Inpatient/ED visit.  Follow Up Plan: Additional outreach attempts will be made to reach the patient to complete the Transitions of Care (Post Inpatient/ED visit) call.   Phu Record J. Cristela Felt, RN, BSN, MSN Care Management Coordinator/[place] Phone Number: [phone]

## 2023-01-13 NOTE — Transitions of Care (Post Inpatient/ED Visit) (Signed)
   01/13/2023  Name: Loch Iribe MRN: [id number] DOB: Mar 20, 1966  Today's TOC FU Call Status: Unsuccessful Call (1st Attempt) Date: 01/13/23 (am) Unsuccessful Call (2nd Attempt) Date: 01/13/23 (pm)  Attempted to reach the patient regarding the most recent Inpatient/ED visit.  Follow Up Plan: Additional outreach attempts will be made to reach the patient to complete the Transitions of Care (Post Inpatient/ED visit) call.   Rockie Vawter J. Cristela Felt, RN, BSN, MSN Care Management Coordinator/[place] Phone Number:  [phone]

## 2023-01-13 NOTE — Transitions of Care (Post Inpatient/ED Visit) (Signed)
   01/13/2023  Name: Cody Webster MRN: [id number] DOB: 1966/03/19  Today's TOC FU Call Status: Today's TOC FU Call Status:: Unsuccessful Call (1st Attempt) Unsuccessful Call (1st Attempt) Date: 01/13/23  Attempted to reach the patient regarding the most recent Inpatient/ED visit.  Follow Up Plan: Additional outreach attempts will be made to reach the patient to complete the Transitions of Care (Post Inpatient/ED visit) call.  Consetta Cosner J. Cristela Felt, RN, BSN, MSN Care Management Coordinator/[place] Phone Number: [phone]

## 2023-01-13 NOTE — Telephone Encounter (Cosign Needed)
Transition of Care Contact from Inpatient Facility   Date of Discharge: 01/12/2023 Date of Contact: 01/13/2023 Method of contact: phone Talked to patient   Patient contacted to discuss transition of care form recent hospitaliztion. Patient was seen in the ED on 8/26 for issues secondary to pain and was discharged with PO hydromorphone. He was seen again 01/12/2023 in the ED for similar complaints of pain but left AMA due to "nobody listening to me".  Medication changes were reviewed. I encouraged him to continue to take his medications as prescribed. He has not been dialyzed in over a week. I also encouraged him to speak with his PCP regarding pain control so dialysis treatments can be resumed. Adverse effects of uremia discussed with patient.  Patient will follow up with his outpatient dialysis center on Monday at [facility].   Santiago Bumpers, PA-S2 [facility]

## 2023-01-17 ENCOUNTER — Telehealth: Payer: Self-pay

## 2023-01-17 NOTE — Transitions of Care (Post Inpatient/ED Visit) (Signed)
   01/17/2023  Name: Cody Webster MRN: [id number] DOB: 03-23-1966  Today's TOC FU Call Status: Today's TOC FU Call Status:: Unsuccessful Call (3rd Attempt) Unsuccessful Call (1st Attempt) Date: 01/13/23 Unsuccessful Call (2nd Attempt) Date: 01/17/23  Attempted to reach the patient regarding the most recent Inpatient/ED visit.  Follow Up Plan: Additional outreach attempts will be made to reach the patient to complete the Transitions of Care (Post Inpatient/ED visit) call.   Shawnese Magner J. Cristela Felt, RN, BSN, MSN Care Management Coordinator/[place] Phone Number: [phone]

## 2023-01-18 ENCOUNTER — Telehealth: Payer: Self-pay

## 2023-01-18 NOTE — Transitions of Care (Post Inpatient/ED Visit) (Signed)
   01/18/2023  Name: Cody Webster MRN: [id number] DOB: [birth year]  Today's TOC FU Call Status: Unsuccessful Call (1st Attempt) Date: 01/13/23 Unsuccessful Call (2nd Attempt) Date: 01/13/23 Unsuccessful Call (3rd Attempt) Date: 01/18/2023  Attempted to reach the patient regarding the most recent Inpatient/ED visit.  Follow Up Plan: No further outreach attempts will be made at this time. We have been unable to contact the patient.  Ubaldo Daywalt J. Cristela Felt, RN, BSN, MSN Care Management Coordinator/[place] Phone Number:  [phone]

## 2023-01-23 ENCOUNTER — Telehealth: Payer: Self-pay

## 2023-01-23 NOTE — Telephone Encounter (Signed)
Attempted to reach pt to r/s his appt with MD that he did not show up for in July. Pt's numbers listed did not have VM set up, including his alt contact. Was able to reach [facility] and pt was there for treatment. He still needs AVF and is getting HD through catheter. Pt has been given an MD appt tomorrow with [place]. Pt was given cardiac cx but is not to hold antiplatelets until May 2025, as it could compromise his cardiac stent.

## 2023-01-24 ENCOUNTER — Ambulatory Visit (INDEPENDENT_AMBULATORY_CARE_PROVIDER_SITE_OTHER): Payer: Medicaid Other | Admitting: Vascular Surgery

## 2023-01-24 ENCOUNTER — Encounter: Payer: Self-pay | Admitting: Vascular Surgery

## 2023-01-24 VITALS — BP 117/69 | HR 96 | Temp 98.1°F | Resp 20 | Ht 73.0 in | Wt 186.0 lb

## 2023-01-24 DIAGNOSIS — N186 End stage renal disease: Secondary | ICD-10-CM | POA: Diagnosis not present

## 2023-01-24 DIAGNOSIS — Z992 Dependence on renal dialysis: Secondary | ICD-10-CM

## 2023-01-24 NOTE — Progress Notes (Signed)
[facility] [place] PROGRESS NOTE  ASSESSMENT / PLAN: Cody Webster is a 57 y.o. male end-stage renal disease.  He was seen around Christmas time by my partner Dr. Edilia Bo and offered new dialysis access in the right upper extremity.  He is just returned to clinic and reports to me he is moving to [place] on Saturday.  I counseled him that we cannot likely do dialysis access surgery for him prior to his departure.  I encouraged him to follow-up with a local vascular surgeon in his new town.  If he is not able to do so, I am happy to take care of him here.  My plan would be for a right brachiocephalic arteriovenous fistula.    SUBJECTIVE: There is a clinic after 9 months for dialysis access.  The patient thought he was having dialysis access surgery today.  He was last seen by my partner Dr. Edilia Bo around Christmas time of 23 where he was offered inpatient right brachiocephalic arteriovenous fistula creation.  Patient declined and was lost to follow-up.  He returns to clinic today for evaluation.  He reports to me he is moving to [place] on Saturday.  OBJECTIVE: BP 117/69 (BP Location: Right Arm, Patient Position: Sitting, Cuff Size: Normal)   Pulse 96   Temp 98.1 F (36.7 C)   Resp 20   Ht 6\' 1"  (1.854 m)   Wt 186 lb (84.4 kg)   SpO2 100%   BMI 24.54 kg/m   [place] ill man in no distress Regular rate and rhythm Unlabored breathing Left upper extremity with patent left brachiobasilic arteriovenous fistula.  Duplex of this showed diminutive diameter.  The fistula was not suitable for transposition at that time. Palpable radial pulses bilaterally     Latest Ref Rng & Units 01/12/2023    3:59 AM 01/11/2023   12:53 PM 01/09/2023    3:15 PM  CBC  WBC 4.0 - 10.5 K/uL 8.2  7.6  8.9   Hemoglobin 13.0 - 17.0 g/dL 8.9  9.2  40.9   Hematocrit 39.0 - 52.0 % 27.6  28.9  33.1   Platelets 150 - 400 K/uL 202  179  192         Latest Ref Rng & Units 01/12/2023    6:23  AM 01/11/2023   12:53 PM 01/09/2023    3:15 PM  CMP  Glucose 70 - 99 mg/dL 85  96  79   BUN 6 - 20 mg/dL 70  60  50   Creatinine 0.61 - 1.24 mg/dL 81.19  14.78  29.56   Sodium 135 - 145 mmol/L 134  135  133   Potassium 3.5 - 5.1 mmol/L 4.3  4.9  4.6   Chloride 98 - 111 mmol/L 91  91  89   CO2 22 - 32 mmol/L 21  20  25    Calcium 8.9 - 10.3 mg/dL 7.8  8.1  8.4   Total Protein 6.5 - 8.1 g/dL   7.4   Total Bilirubin 0.3 - 1.2 mg/dL   0.8   Alkaline Phos 38 - 126 U/L   144   AST 15 - 41 U/L   11   ALT 0 - 44 U/L   8     Estimated Creatinine Clearance: 5.2 mL/min (A) (by C-G formula based on SCr of 17.26 mg/dL (H)).  Rande Brunt. Lenell Antu, MD [facility] Phone Number: [phone] 01/24/2023 4:41 PM

## 2023-01-30 NOTE — Progress Notes (Deleted)
Cardiology Clinic Note   Patient Name: Jaquay Lucio Date of Encounter: 01/30/2023  Primary Care Provider:  Grayce Sessions, NP Primary Cardiologist:  None  Patient Profile    Claudis Matthes 57 year old male presents to the clinic today for follow-up evaluation of his coronary artery disease, chronic combined systolic and diastolic CHF, hypertension, and hyperlipidemia.  Past Medical History    Past Medical History:  Diagnosis Date   Anemia    CAD (coronary artery disease)    stent to RCA in May '24   Carotid artery occlusion    Chronic combined systolic (congestive) and diastolic (congestive) heart failure (HCC)    a. 06/2014 Echo: EF 50%; b. 07/2017 Echo: EF 55-60%; c. 07/2020 Echo: EF 50-55%; d. 04/2022 Echo: EF 45-50%, basal-mid anterolateral and basal to mid inf HK. Mild conc LVH, GrII DD. NL RV fxn. RVSP 51.54mmHg. Sev dil LA. Mildly dil RA. MIld-mod MR.   Depression    ESRD (end stage renal disease) (HCC)    a. 04/2022 - initiated HD-->MWF   GERD (gastroesophageal reflux disease)    Hypertensive heart disease    Ischemic cardiomyopathy    a. 06/2014 Echo: EF 50%; b. 07/2017 Echo: EF 55-60%; d. 04/2018 MV: EF 47%, moderate inf ischemia; e. 04/2022 Echo: EF 45-50%.   Latent tuberculosis    Peripheral neuropathy    Pneumonia    PTSD (post-traumatic stress disorder)    Seizures (HCC)    27 years ago   Stroke [facility])    a. 04/2022 MRI Brain: small acute infarct - R posterior pons and subcortical L cerebral hemisphere. Chronic sm vessel dzs.   Tobacco abuse    Type II diabetes mellitus (HCC)    type 2   Past Surgical History:  Procedure Laterality Date   AV FISTULA PLACEMENT Left 11/08/2016   Procedure: ARTERIOVENOUS (AV) FISTULA CREATION;  Surgeon: Chuck Hint, MD;  Location: [facility] OR;  Service: Vascular;  Laterality: Left;   AV FISTULA PLACEMENT Left 03/23/2020   Procedure: LEFT BASILIC VEIN FISTULA CREATION;  Surgeon: Chuck Hint, MD;  Location: [facility]  OR;  Service: Vascular;  Laterality: Left;   CORONARY LITHOTRIPSY N/A 09/27/2022   Procedure: CORONARY LITHOTRIPSY;  Surgeon: [name], MD;  Location: [facility] INVASIVE CV LAB;  Service: Cardiovascular;  Laterality: N/A;   CORONARY STENT INTERVENTION N/A 09/27/2022   Procedure: CORONARY STENT INTERVENTION;  Surgeon: [name], MD;  Location: [facility] INVASIVE CV LAB;  Service: Cardiovascular;  Laterality: N/A;   IR FLUORO GUIDE CV LINE RIGHT  05/10/2022   IR US GUIDE VASC ACCESS RIGHT  05/10/2022   LEFT HEART CATH AND CORONARY ANGIOGRAPHY N/A 09/27/2022   Procedure: LEFT HEART CATH AND CORONARY ANGIOGRAPHY;  Surgeon: [name], MD;  Location: [facility] INVASIVE CV LAB;  Service: Cardiovascular;  Laterality: N/A;   LEFT HEART CATH AND CORONARY ANGIOGRAPHY N/A 01/12/2023   Procedure: LEFT HEART CATH AND CORONARY ANGIOGRAPHY;  Surgeon: [place], Peter M, MD;  Location: [facility] INVASIVE CV LAB;  Service: Cardiovascular;  Laterality: N/A;   NM MYOVIEW LTD  07/2017   "Moderate sized moderate severity" (on cardiology was very small size, small intensity) partially reversible inferoapical//inferoseptal defect.  (Read as intermediate-high risk) -> over read by cardiology as LOW RISK   NM MYOVIEW LTD  09/2019   [facility] Small sized, mild severity reversible defect involving apical lateral & mid anterolateral wall thought to be artifacts - but CRO mild ischemia.  EF 55%. => Read as LOW RISK:  right foot surgery     TOE AMPUTATION     TRANSTHORACIC ECHOCARDIOGRAM  07/2017   EF 60%.,  Moderate LVH.  GR 1 DD.  No R WMA.  Mild RV and moderate RA dilation.   TRANSTHORACIC ECHOCARDIOGRAM  10/03/2019   [facility]: EF 40%, mild MR, mild aortic calcification    Allergies  Allergies  Allergen Reactions   Morphine And Codeine Shortness Of Breath and Other (See Comments)    Hallucination  Tolerates Norco/Vicodin    History of Present Illness    Demitrious Gean has a PMH of end-stage renal disease on  hemodialysis, coronary artery disease, hyperlipidemia, hypertension, presumed ischemic cardiomyopathy, CVA, PTSD, and tobacco use.  Echocardiogram 12/23 showed an LVEF of 45-50%, hypokinesis of the mid anterolateral, basal-mid inferior, and severe left atrial enlargement with mild-moderate mitral valve regurgitation.  Cardiology was consulted for evaluation of chest pain at the request of Dr. Maisie Fus.  His high-sensitivity troponins were noted to be 236 and 243.  His creatinine was noted to be 15.  He reported chest pain over several days that developed while he was in hemodialysis.  He had been sent to the emergency department from dialysis.  He reported medication noncompliance.  He indicated that he was able to get his medications without issues.  He underwent repeat cardiac catheterization on 01/12/2023 and was noted to have patent proximal RCA and moderate disease in his circumflex and LAD.  Medical management was recommended.  A Mynx closure device was used.  He was continued on aspirin Plavix metoprolol and Imdur.  His GDMT was unable to be uptitrated due to his renal disease.  He presents to the clinic today for follow-up evaluation and states***.  *** denies chest pain, shortness of breath, lower extremity edema, fatigue, palpitations, melena, hematuria, hemoptysis, diaphoresis, weakness, presyncope, syncope, orthopnea, and PND.  Coronary artery disease, chest pain, elevated troponin-no chest pain today.  Underwent repeat cardiac catheterization on 01/12/2023.  He was noted to have widely patent RCA stent and unchanged circumflex and LAD disease.  Medical management was recommended. Continue aspirin, Plavix, metoprolol, Imdur  Hyperlipidemia-LDL***. Continue atorvastatin High-fiber diet Increase physical activity as tolerated  End-stage renal disease-on HD, Monday Wednesday Friday. Strongly encourage compliance  Essential hypertension-BP today***. Continue metoprolol, Imdur Continue  HD Low-sodium diet  HFrEF-euvolemic.  Weight managed by HD.  Echocardiogram 12/23 showed EF of 45-50%, basal-mid anterolateral and basal-mid inferior hypokinesis, G2 DD, and severely dilated left atria with mild-moderate mitral valve regurgitation. Continue current medical therapy Heart healthy low-sodium diet Increase physical activity as tolerated  Disposition: Follow-up with Dr. Herbie Baltimore or me in 3-4 months.  Home Medications    Prior to Admission medications   Medication Sig Start Date End Date Taking? Authorizing Provider  acetaminophen (TYLENOL) 500 MG tablet Take 1,000 mg by mouth every 6 (six) hours as needed (for pain/headaches.).    [provider]  aspirin EC 81 MG tablet Take 1 tablet (81 mg total) by mouth daily. Swallow whole. Patient taking differently: Take 81 mg by mouth daily. 10/06/22   Cannon Kettle, PA-C  atorvastatin (LIPITOR) 80 MG tablet Take 1 tablet (80 mg total) by mouth daily. 01/12/23 04/12/23  Rai, Delene Ruffini, MD  clopidogrel (PLAVIX) 75 MG tablet Take 1 tablet (75 mg total) by mouth daily. 01/13/23   Rai, Ripudeep K, MD  fluticasone (FLONASE) 50 MCG/ACT nasal spray Place 2 sprays into both nostrils daily. Patient taking differently: Place 2 sprays into both nostrils daily as needed for  allergies. 08/25/20   Grayce Sessions, NP  gabapentin (NEURONTIN) 300 MG capsule TAKE 1 CAPSULE (300 MG TOTAL) BY MOUTH 2 (TWO) TIMES DAILY. Patient taking differently: Take 300 mg by mouth 2 (two) times daily as needed (for pain). 11/29/22   Nadara Mustard, MD  isosorbide mononitrate (IMDUR) 30 MG 24 hr tablet Take 1 tablet (30 mg total) by mouth daily. 10/06/22 01/04/23  Cannon Kettle, PA-C  metoprolol succinate (TOPROL XL) 25 MG 24 hr tablet Take 1 tablet (25 mg total) by mouth daily. 01/12/23   Rai, Delene Ruffini, MD  nitroGLYCERIN (NITROSTAT) 0.4 MG SL tablet Place 1 tablet (0.4 mg total) under the tongue every 5 (five) minutes as needed for chest pain (CP or  SOB). 07/22/17   Albertine Grates, MD  oxyCODONE-acetaminophen (PERCOCET) 10-325 MG tablet Take 1 tablet by mouth every 6 (six) hours as needed for pain. 10/22/22   Charlynne Pander, MD  traZODone (DESYREL) 100 MG tablet TAKE 1/2 TO 1 TABLET BY MOUTH AT BEDTIME AS NEEDED FOR SLEEP. Patient taking differently: Take 50 mg by mouth at bedtime as needed for sleep. 06/04/22   Grayce Sessions, NP    Family History    Family History  Problem Relation Age of Onset   CAD Mother    Heart attack Mother    Diabetes type II Father    Stroke Father    CAD Father    Hypertension Sister    CAD Sister    Diabetes type II Brother    Colon cancer Maternal Uncle        2015   Colon polyps Neg Hx    Esophageal cancer Neg Hx    Stomach cancer Neg Hx    Rectal cancer Neg Hx    He indicated that his mother is deceased. He indicated that his father is deceased. He indicated that all of his three sisters are alive. He indicated that all of his four brothers are alive. He indicated that his maternal grandmother is deceased. He indicated that his maternal grandfather is deceased. He indicated that his paternal grandmother is deceased. He indicated that his paternal grandfather is deceased. He indicated that the status of his maternal uncle is unknown. He indicated that the status of his neg hx is unknown.  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  Does not routinely exercise.   Social Determinants of Health   Financial Resource Strain: Medium Risk (08/30/2022)   Received from [facility]   Overall Financial  Resource Strain (CARDIA)    Difficulty of Paying Living Expenses: Somewhat hard  Food Insecurity: Not on file (01/22/2023)  Transportation Needs: No Transportation Needs (10/25/2022)   PRAPARE - Administrator, Civil Service (Medical): No    Lack of Transportation (Non-Medical): No  Physical Activity: Not on File (07/23/2019)   Received from [place], [place]   Physical Activity    Physical Activity: 0  Stress: No Stress Concern Present (05/27/2022)   [employer] of Occupational Health - Occupational Stress Questionnaire    Feeling of Stress : Only a little  Social Connections: Unknown (09/12/2021)   Received from [facility], [facility]   Social Network    Social Network: Not on file  Intimate Partner Violence: Not At Risk (09/26/2022)   Humiliation, Afraid, Rape, and Kick questionnaire    Fear of Current or Ex-Partner: No    Emotionally Abused: No    Physically Abused: No    Sexually Abused: No     Review of Systems    General:  No chills, fever, night sweats or weight changes.  Cardiovascular:  No chest pain, dyspnea on exertion, edema, orthopnea, palpitations, paroxysmal nocturnal dyspnea. Dermatological: No rash, lesions/masses Respiratory: No cough, dyspnea Urologic: No hematuria, dysuria Abdominal:   No nausea, vomiting, diarrhea, bright red blood per rectum, melena, or hematemesis Neurologic:  No visual changes, wkns, changes in mental status. All other systems reviewed and are otherwise negative except as noted above.  Physical Exam    VS:  There were no vitals taken for  this visit. , BMI There is no height or weight on file to calculate BMI. GEN: Well nourished, well developed, in no acute distress. HEENT: normal. Neck: Supple, no JVD, carotid bruits, or masses. Cardiac: RRR, no murmurs, rubs, or gallops. No clubbing, cyanosis, edema.  Radials/DP/PT 2+ and equal bilaterally.  Respiratory:  Respirations regular and unlabored, clear to auscultation bilaterally. GI: Soft, nontender, nondistended, BS + x 4. MS: no deformity or atrophy. Skin: warm and dry, no rash. Neuro:  Strength and sensation are intact. Psych: Normal affect.  Accessory Clinical Findings    Recent Labs: 05/04/2022: B Natriuretic Peptide 2,226.4 01/08/2023: Magnesium 2.6 01/09/2023: ALT 8 01/12/2023: BUN 70; Creatinine, Ser 17.26; Hemoglobin 8.9; Platelets 202; Potassium 4.3; Sodium 134   Recent Lipid Panel    Component Value Date/Time   CHOL 131 01/12/2023 0359   CHOL 179 04/07/2021 1134   TRIG 104 01/12/2023 0359   HDL 38 (L) 01/12/2023 0359   HDL 48 04/07/2021 1134   CHOLHDL 3.4 01/12/2023 0359   VLDL 21 01/12/2023 0359   LDLCALC 72 01/12/2023 0359   LDLCALC 117 (H) 04/07/2021 1134    No BP recorded.  {Refresh Note OR Click here to enter BP  :1}***    ECG personally reviewed by me today- ***    Echocardiogram 05/05/2022  IMPRESSIONS     1. Left ventricular ejection fraction, by estimation, is 45 to 50%. The  left ventricle has mildly decreased function. The left ventricle  demonstrates regional wall motion abnormalities with basal to mid  anterolateral and basal to mid inferior  hypokinesis. There is mild concentric left ventricular hypertrophy. Left  ventricular diastolic parameters are consistent with Grade II diastolic  dysfunction (pseudonormalization).   2. Right ventricular systolic function is normal. The right ventricular  size is mildly enlarged. There is moderately elevated pulmonary artery  systolic pressure. The estimated right ventricular systolic  pressure is  51.2 mmHg.   3. Left atrial  size was severely dilated.   4. Right atrial size was mildly dilated.   5. The mitral valve is degenerative. Mild to moderate mitral valve  regurgitation. No evidence of mitral stenosis. Moderate mitral annular  calcification.   6. The aortic valve is tricuspid. There is mild calcification of the  aortic valve. Aortic valve regurgitation is not visualized. No aortic  stenosis is present.   7. The inferior vena cava is dilated in size with <50% respiratory  variability, suggesting right atrial pressure of 15 mmHg.   FINDINGS   Left Ventricle: Left ventricular ejection fraction, by estimation, is 45  to 50%. The left ventricle has mildly decreased function. The left  ventricle demonstrates regional wall motion abnormalities. The left  ventricular internal cavity size was normal  in size. There is mild concentric left ventricular hypertrophy. Left  ventricular diastolic parameters are consistent with Grade II diastolic  dysfunction (pseudonormalization).   Right Ventricle: The right ventricular size is mildly enlarged. No  increase in right ventricular wall thickness. Right ventricular systolic  function is normal. There is moderately elevated pulmonary artery systolic  pressure. The tricuspid regurgitant  velocity is 3.01 m/s, and with an assumed right atrial pressure of 15  mmHg, the estimated right ventricular systolic pressure is 51.2 mmHg.   Left Atrium: Left atrial size was severely dilated.   Right Atrium: Right atrial size was mildly dilated.   Pericardium: Trivial pericardial effusion is present.   Mitral Valve: The mitral valve is degenerative in appearance. There is  moderate calcification of the mitral valve leaflet(s). Moderate mitral  annular calcification. Mild to moderate mitral valve regurgitation. No  evidence of mitral valve stenosis. MV  peak gradient, 6.9 mmHg. The mean mitral valve gradient is 3.0 mmHg.   Tricuspid  Valve: The tricuspid valve is normal in structure. Tricuspid  valve regurgitation is trivial.   Aortic Valve: The aortic valve is tricuspid. There is mild calcification  of the aortic valve. Aortic valve regurgitation is not visualized. No  aortic stenosis is present. Aortic valve mean gradient measures 6.0 mmHg.  Aortic valve peak gradient measures  13.2 mmHg. Aortic valve area, by VTI measures 2.16 cm.   Pulmonic Valve: The pulmonic valve was normal in structure. Pulmonic valve  regurgitation is not visualized.   Aorta: The aortic root is normal in size and structure.   Venous: The inferior vena cava is dilated in size with less than 50%  respiratory variability, suggesting right atrial pressure of 15 mmHg.   IAS/Shunts: No atrial level shunt detected by color flow Doppler.   Cardiac catheterization 09/27/2022  1.  Severe calcific ostial/proximal RCA stenosis of 90%, treated with balloon lithotripsy and coronary stenting using a 2.75 x 16 mm Synergy DES, reducing the stenosis to 0% 2.  Moderate residual coronary stenoses in the mid RCA, mid LAD, and mid circumflex, all likely of borderline hemodynamic significance with diffuse vessel calcification and atherosclerotic disease.  Recommend medical therapy. 3.  Severe calcification of the aortic root and ascending aorta   Recommendations: DAPT with aspirin and clopidogrel through 12 months without interruption.  Aggressive medical therapy.   Recommendations  Antiplatelet/Anticoag Recommend uninterrupted dual antiplatelet therapy with Aspirin 81mg  daily and Clopidogrel 75mg  daily.    Diagnostic Dominance: Right  Intervention      Cardiac catheterization 01/12/2023  Diagnostic Dominance: Right  Intervention      Assessment & Plan   1.  ***   Thomasene Ripple. Retia Cordle NP-C     01/30/2023, 9:25  AM [facility] [address] Office [phone] Fax [phone]    I  spent***minutes examining this patient, reviewing medications, and using patient centered shared decision making involving her cardiac care.  Prior to her visit I spent greater than 20 minutes reviewing her past medical history,  medications, and prior cardiac tests.

## 2023-01-31 ENCOUNTER — Ambulatory Visit: Payer: No Typology Code available for payment source | Attending: General Practice | Admitting: General Practice

## 2023-02-01 ENCOUNTER — Encounter: Payer: Self-pay | Admitting: General Practice

## 2023-03-06 DIAGNOSIS — N189 Chronic kidney disease, unspecified: Secondary | ICD-10-CM | POA: Diagnosis not present

## 2023-05-17 DEATH — deceased
